# Patient Record
Sex: Male | Born: 1950 | Race: White | Hispanic: No | Marital: Married | Smoking: Former smoker
Health system: Southern US, Community
[De-identification: ages and names within clinical notes are randomized; demographics above are authoritative.]

## PROBLEM LIST (undated history)

## (undated) DIAGNOSIS — M7032 Other bursitis of elbow, left elbow: Secondary | ICD-10-CM

## (undated) DIAGNOSIS — N289 Disorder of kidney and ureter, unspecified: Secondary | ICD-10-CM

## (undated) DIAGNOSIS — I340 Nonrheumatic mitral (valve) insufficiency: Secondary | ICD-10-CM

## (undated) DIAGNOSIS — I2510 Atherosclerotic heart disease of native coronary artery without angina pectoris: Secondary | ICD-10-CM

## (undated) DIAGNOSIS — E785 Hyperlipidemia, unspecified: Secondary | ICD-10-CM

## (undated) DIAGNOSIS — I739 Peripheral vascular disease, unspecified: Secondary | ICD-10-CM

## (undated) DIAGNOSIS — E669 Obesity, unspecified: Secondary | ICD-10-CM

## (undated) DIAGNOSIS — L570 Actinic keratosis: Secondary | ICD-10-CM

## (undated) DIAGNOSIS — N529 Male erectile dysfunction, unspecified: Secondary | ICD-10-CM

## (undated) DIAGNOSIS — I25118 Atherosclerotic heart disease of native coronary artery with other forms of angina pectoris: Secondary | ICD-10-CM

## (undated) DIAGNOSIS — E1165 Type 2 diabetes mellitus with hyperglycemia: Secondary | ICD-10-CM

## (undated) DIAGNOSIS — I255 Ischemic cardiomyopathy: Secondary | ICD-10-CM

## (undated) DIAGNOSIS — I5189 Other ill-defined heart diseases: Secondary | ICD-10-CM

## (undated) DIAGNOSIS — M1990 Unspecified osteoarthritis, unspecified site: Secondary | ICD-10-CM

## (undated) DIAGNOSIS — E119 Type 2 diabetes mellitus without complications: Secondary | ICD-10-CM

## (undated) DIAGNOSIS — G4733 Obstructive sleep apnea (adult) (pediatric): Secondary | ICD-10-CM

## (undated) DIAGNOSIS — I491 Atrial premature depolarization: Secondary | ICD-10-CM

## (undated) DIAGNOSIS — I519 Heart disease, unspecified: Secondary | ICD-10-CM

## (undated) DIAGNOSIS — I471 Supraventricular tachycardia, unspecified: Secondary | ICD-10-CM

## (undated) DIAGNOSIS — I10 Essential (primary) hypertension: Secondary | ICD-10-CM

## (undated) DIAGNOSIS — I2089 Other forms of angina pectoris: Secondary | ICD-10-CM

## (undated) DIAGNOSIS — I219 Acute myocardial infarction, unspecified: Secondary | ICD-10-CM

## (undated) DIAGNOSIS — J45909 Unspecified asthma, uncomplicated: Secondary | ICD-10-CM

## (undated) DIAGNOSIS — N200 Calculus of kidney: Secondary | ICD-10-CM

## (undated) DIAGNOSIS — Z955 Presence of coronary angioplasty implant and graft: Secondary | ICD-10-CM

## (undated) DIAGNOSIS — I5020 Unspecified systolic (congestive) heart failure: Secondary | ICD-10-CM

## (undated) DIAGNOSIS — I209 Angina pectoris, unspecified: Secondary | ICD-10-CM

## (undated) HISTORY — PX: CORONARY STENT PLACEMENT: SHX1402

## (undated) HISTORY — DX: Other forms of angina pectoris: I20.89

## (undated) HISTORY — DX: Unspecified systolic (congestive) heart failure: I50.20

## (undated) HISTORY — DX: Peripheral vascular disease, unspecified: I73.9

## (undated) HISTORY — DX: Ischemic cardiomyopathy: I25.5

## (undated) HISTORY — DX: Actinic keratosis: L57.0

## (undated) HISTORY — DX: Unspecified osteoarthritis, unspecified site: M19.90

## (undated) HISTORY — DX: Calculus of kidney: N20.0

## (undated) HISTORY — DX: Heart disease, unspecified: I51.9

## (undated) HISTORY — DX: Other bursitis of elbow, left elbow: M70.32

## (undated) HISTORY — DX: Nonrheumatic mitral (valve) insufficiency: I34.0

## (undated) HISTORY — DX: Obstructive sleep apnea (adult) (pediatric): G47.33

## (undated) HISTORY — DX: Atrial premature depolarization: I49.1

## (undated) HISTORY — DX: Supraventricular tachycardia, unspecified: I47.10

## (undated) HISTORY — DX: Unspecified asthma, uncomplicated: J45.909

## (undated) HISTORY — DX: Obesity, unspecified: E66.9

## (undated) HISTORY — PX: CORONARY ANGIOPLASTY: SHX604

## (undated) HISTORY — DX: Male erectile dysfunction, unspecified: N52.9

## (undated) SURGERY — LEFT HEART CATH AND CORONARY ANGIOGRAPHY
Anesthesia: LOCAL

---
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
## 2007-09-13 ENCOUNTER — Inpatient Hospital Stay (HOSPITAL_COMMUNITY): Admission: EM | Admit: 2007-09-13 | Discharge: 2007-09-20 | Payer: Self-pay | Admitting: Emergency Medicine

## 2007-09-16 ENCOUNTER — Encounter (INDEPENDENT_AMBULATORY_CARE_PROVIDER_SITE_OTHER): Payer: Self-pay | Admitting: Cardiology

## 2009-11-02 ENCOUNTER — Emergency Department (HOSPITAL_COMMUNITY): Admission: EM | Admit: 2009-11-02 | Discharge: 2009-11-02 | Payer: Self-pay | Admitting: Emergency Medicine

## 2010-07-28 LAB — BASIC METABOLIC PANEL
BUN: 10 mg/dL (ref 6–23)
CO2: 22 mEq/L (ref 19–32)
Calcium: 9.3 mg/dL (ref 8.4–10.5)
Chloride: 106 mEq/L (ref 96–112)
Creatinine, Ser: 0.89 mg/dL (ref 0.4–1.5)
GFR calc Af Amer: >60 mL/min (ref >60)
Glucose, Bld: 159 mg/dL — ABNORMAL HIGH (ref 70–99)
Potassium: 4.1 mEq/L (ref 3.5–5.1)
Sodium: 139 mEq/L (ref 135–145)

## 2010-07-28 LAB — DIFFERENTIAL
Lymphocytes Relative: 30 % (ref 12–46)
Lymphs Abs: 1.6 10*3/uL (ref 0.7–4.0)

## 2010-07-28 LAB — PROTIME-INR: Prothrombin Time: 15.4 seconds — ABNORMAL HIGH (ref 11.6–15.2)

## 2010-07-28 LAB — CBC
HCT: 43.2 % (ref 39.0–52.0)
Hemoglobin: 14.8 g/dL (ref 13.0–17.0)
MCH: 33.3 pg (ref 26.0–34.0)
RDW: 13.4 % (ref 11.5–15.5)
WBC: 5.5 10*3/uL (ref 4.0–10.5)

## 2010-07-28 LAB — CK TOTAL AND CKMB (NOT AT ARMC)
CK, MB: 1.9 ng/mL (ref 0.3–4.0)
Total CK: 78 U/L (ref 7–232)

## 2010-07-28 LAB — APTT: aPTT: 28 seconds (ref 24–37)

## 2010-09-24 NOTE — Cardiovascular Report (Signed)
[name], DERUITER NO.:  [id number]   MEDICAL RECORD NO.:  [id number]          PATIENT TYPE:  INP   LOCATION:  2911                         FACILITY:  [facility]   PHYSICIAN:  Cristy Hilts. Jacinto Halim, MD       DATE OF BIRTH:  1951-03-09   DATE OF PROCEDURE:  09/13/2007  DATE OF DISCHARGE:                            CARDIAC CATHETERIZATION   PROCEDURE PERFORMED:  1. Left ventriculography.  2. Selective right and left coronary artery angiography.  3. Percutaneous transluminal coronary angioplasty and stenting of the      proximal left anterior descending.  4. Percutaneous transluminal coronary angioplasty and balloon      angioplasty of the in-stent restenotic mid left anterior      descending.  5. Rescue of the stent-jailed diagonal one branch of the left anterior      descending.   INDICATIONS:  [name] is a 60 year old gentleman with known  coronary artery disease.  He has undergone angioplasty and stent  implantation x2 in the past in 1995 and again in 2000, somewhere between  1997 and 2001, details are not known.  He is a smoker, who was admitted  through emergency room as a STEMI for acute anterior wall myocardial  infarction.  Given his ongoing chest discomfort with EKG abnormality, he  was emergently brought to the cardiac catheterization lab to evaluate  his coronary anatomy.   HEMODYNAMIC DATA:  The left ventricular pressure was 143/0 with end-  diastolic pressure of 34 mmHg.  Aortic pressure was 131/70 with a mean  of 93 mmHg.  There was a 10-mm pressure gradient across the aortic  valve.   ANGIOGRAPHIC DATA:  1. Left ventricle:  Left ventricular systolic function was mildly      depressed around 45%-50% with the mid-to-distal anteroapical and      apical akinesis.  There was no significant mitral regurgitation.  2. Right coronary artery:  Right coronary artery is a large-caliber      vessel and a dominant vessel.  It has got mild luminal diffuse    irregularity.  3. Left main coronary artery:  Left main coronary artery is a large-      caliber vessel.  Smooth, and it has got 30% ostial and distal      stenosis.  4. LAD:  LAD is a large-caliber vessel.  There is a thrombotic 90% in      the proximal RCA proximal to the previously placed stent.  The      stent also shows an in-stent restenotic 90% stenosis and could not      exclude the presence of thrombus in this LAD.  The diagonal stent      was patent; however, the ostium of the diagonal had mild disease.  5. Circumflex coronary artery:  Circumflex coronary artery is a large-      caliber vessel.  Gives origin to large obtuse marginal-1, which has      an ostial 50% stenosis.  At the bifurcation of this obtuse      marginal, the circumflex also showed a 40%  stenosis and a mid 50%      stenosis.  Distal circumflex showed a bifurcating 80%-90% stenoses.      These branches were small and probably not amenable for balloon      angioplasty.  A small AV groove branch from the circumflex also      shows a focal 90% ostial stenosis.  There were faint collaterals      noted from the right coronary artery to the distal circumflex      coronary artery.   INTERVENTIONAL DATA:  Successful PTCA and stenting of the proximal LAD  with implantation of a 3.5 x 23-mm PROMUS stent deployed at 6  atmospheric pressure for 40 seconds.  This stent was postdilated with a  3.75 x 15-mm Quantum at peak of 14 atmospheric pressure.  Overall, the  stenosis was reduced from 90% stenosis to 0% with TIMI-3 to TIMI-3 flow  maintained at the end of the procedure.   Successful PTCA and balloon angioplasty of the in-stent restenotic mid  LAD with a 3.0 x 30-mm Apex balloon with reduction of stenosis from 90%  to 0%.  TIMI-3 flow was maintained pre and post-procedure.   The diagonal branch of the LAD got stent-jailed and was completely  occluded after proximal stent implantation.  However, this was easily   recanalized with a wire, and kissing-balloon angioplasty was performed  utilizing a 2.5 x 15-mm Apex balloon at the ostium of the diagonal with  complete salvage and maintenance of TIMI-3 flow.   The procedure was finished by performing kissing-balloon angioplasty  into the diagonal branch and the LAD.   RECOMMENDATIONS:  The patient will need aggressive risk modification and  especially smoking cessation.  He will need medical therapy for the  circumflex disease.   A total of 235 mL of contrast was utilized for diagnostic and  interventional procedure.   TECHNIQUE OF THE PROCEDURE:  Using heparin and ReoPro for  anticoagulation, a 6-French right femoral venous access and a 7-French  right femoral arterial access were obtained, and a 7-French arterial  sheath was placed.  A 6-French multipurpose B2 catheter was advanced  into the ascending aorta and then into the left ventricle.  The left  ventriculography was performed both in LAO and RAO projection.  The  catheter was then pulled into the ascending aorta.  Right coronary  artery was selectively engaged and angiography was performed.  Then, the  left main coronary artery was selectively engaged and angiography was  performed.  Nitroglycerin was also administered angiography.  The  catheter was then pulled out of the body.   Using a 7-French JL4 guide catheter, left main coronary artery was  selectively engaged and angiography was performed.  A 0.014th of an inch  ATW marker guidewire was utilized and the LAD was crossed.  A 3.0 x 30-  mm Apex balloon was utilized, and multiple balloon inflations were  performed within the stent and outside of the stent into the proximal  LAD, and excellent results were noted with residual haziness in the  proximal LAD.  Then, I stented this with a 3.5 x 23-mm PROMUS at 16  atmospheric pressure and angiography revealed occluded diagonal of,  i.e., stent-jailed.  The mid LAD stent was also dilated  with a 3.0 x 30-  mm Apex balloon and at the same time, I advanced Asahi Prowater 0.014th  of an inch guidewire into the diagonal branch and a 2.5 x 15-mm Apex  balloon was utilized,  and ostium was dilated at 10 atmospheric pressure  for 35 seconds.  Two inflations were performed into this diagonal branch  and angiography was repeated.  Excellent results were noted, and I  pulled the 3.0 x 30-mm Apex balloon into the proximal LAD overlapping  with the previously placed stent and placed the 2.5 x 50-mm Apex balloon  into the diagonal branch, and kissing-balloon angioplasty was performed  and angiography was repeated.  Excellent results were noted.  The  guidewire guide catheter was then pulled out of the body and angiography  was repeated.  Intracoronary nitroglycerin was also administered.  After  confirming good results, the guide catheter was disengaged and pulled  out of the body.  The patient tolerated the procedure well.  No  immediate complication was noted.      Cristy Hilts. Jacinto Halim, MD  Electronically Signed     JRG/MEDQ  D:  09/13/2007  T:  09/14/2007  Job:  [id number]   cc:   Barron Alvine

## 2010-09-24 NOTE — H&P (Signed)
[name], [name] NO.:  [id number]   MEDICAL RECORD NO.:  [id number]          PATIENT TYPE:  INP   LOCATION:  2911                         FACILITY:  [facility]   PHYSICIAN:  Cristy Hilts. Jacinto Halim, MD       DATE OF BIRTH:  Aug 18, 1950   DATE OF ADMISSION:  09/13/2007  DATE OF DISCHARGE:                              HISTORY & PHYSICAL   CHIEF COMPLAINT:  Chest pain.   HISTORY OF PRESENT ILLNESS:  A 60 year old, white, married, male patient  not seen by a cardiologist in [place] previously who has a history of  MI in 1995 with stent placed, some minor cardiac issues until 2001 and  had another stent placed.  This was in [place], [place] with the [employer]  He moved to the [place] area 2 years ago and has seen a Dr. Hollice Espy at  [place] and no other no cardiologist.  Today he developed acute onset  mid sternal chest pain with radiation across the shoulders, was not  diaphoretic, positive short of breath. We saw him in the emergency room  as this was called as an ST elevation MI and he had pain for  approximately 1 hour and 15 minutes, but that was guessing because he  was not sure what time it started.   In the ER, he had ST elevations in V1-V4 and reciprocal changes in 2, 3  and aVF.  He was seen by Dr. Jacinto Halim, EKG was seen and the patient was  transported immediately to the cardiac cath lab for emergent cardiac  cath.   PAST MEDICAL HISTORY:  Cardiac as stated.  His wife will bring in  records but they live in [place] currently and she did not have the  records en route to the hospital. Other history does include  hyperlipidemia, borderline hypertension, no diabetes and positive  tobacco use and history of tonsillectomy.   ALLERGIES:  ATARAX.   CURRENT MEDICATIONS:  1. Aspirin 81 daily.  2. Lipitor unsure of the dose.  3. Lopressor one daily.   FAMILY HISTORY:  Not significant to this admission.   SOCIAL HISTORY:  He is married.  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 Continues to smoke.   REVIEW OF SYSTEMS:  GENERAL:  No recent colds or fevers.  SKIN:  No  rashes.  HEENT:  No blurred vision.  CARDIOVASCULAR:  As stated in HPI.  PULMONARY:  No shortness of breath until today.  GI:  No blood in his  stools or his urine. No history of any GI bleeds. MUSCULOSKELETAL:  Negative but again this is a brief history as the patient in route to  the cath lab.  NEURO:  No history of CVA and no syncope.  ENDOCRINE:  No  diabetes.  No thyroid disease.   PHYSICAL EXAM:  Blood pressure 135/94 on arrival to the cath lab. Heart  rate 102, sinus rhythm, respirations initially 27. Oxygen saturation 97%  on 2 liters.  GENERAL:  Alert and oriented white male in no acute distress except that  he continued to have  pain.  He is a stoic individual, did not appear to  be in distress.  Affect pleasant but obviously anxious and concerned  about his pain and he was aware he was having an MI.  SKIN:  Warm and dry.  Brisk capillary refill.  HEENT:  Normocephalic.  Sclera clear.  NECK:  Supple.  No JVD, no bruits.  No thyromegaly.  LUNGS:  Clear without rales, rhonchi or wheezes.  HEART:  S1, S2 regular rate and rhythm.  No obvious murmur, gallop, rub  or click.  ABDOMEN:  Soft, nontender, positive bowel sounds.  Do not palpate liver,  spleen or masses.  LOWER EXTREMITIES:  Without edema.  Pedal pulses present.  NEURO:  Alert and oriented x3.  Follows commands.   ASSESSMENT:  1. Acute ST elevation myocardial infarction.  2. History of coronary disease.  3. Hyperlipidemia.  4. History of borderline hypertension.  5. Positive tobacco use.   PLAN:  Emergently to the cath lab for intervention. Will need to get old  records that he has at home. His wife was contacted and she will bring  them in on Sep 14, 2007. We will also have tobacco cessation medical  treatment as well.  Dr. Jacinto Halim saw him and assessed him and then the  patient was en route to the  cath lab.      Darcella Gasman. [place], N.P.      Cristy Hilts. Jacinto Halim, MD  Electronically Signed    LRI/MEDQ  D:  09/13/2007  T:  09/13/2007  Job:  [id number]

## 2010-09-27 NOTE — Discharge Summary (Signed)
NAMETISON, LEIBOLD NO.:  [id number]   MEDICAL RECORD NO.:  [id number]          PATIENT TYPE:  INP   LOCATION:  2019                         FACILITY:  [facility]   PHYSICIAN:  Dani Gobble, MD       DATE OF BIRTH:  1951/03/10   DATE OF ADMISSION:  09/13/2007  DATE OF DISCHARGE:  09/20/2007                               DISCHARGE SUMMARY   Mr. Aumiller is a 60 year old white married male who came as a ST  elevation MI.  He was taken emergently to the cath lab.  He had a prior  history of coronary artery disease with a history of an MI in 1995 with  a stent placed without any later cardiac issues until 2001 at which time  his CABG took place.  This was apparently when his family lived in [place]  in [place].  He moved to [facility] about does 2 years ago and has been  seeing Dr. Hollice Espy in [place].  He developed acute onset of midsternal  chest pain with radiation across the shoulders.  He was not diaphoretic.  He was short of breath.  He was brought to the emergency room and ST  elevation MI was called.  He was seen by Dr. Yates Decamp and taken to the  cath lab.  He was found to have a disease in his RCA 40%-30%, circumflex  had 40%-50%.  He had 90% in his distal circumflex.  His proximal LAD was  90%.  He had thrombotic stenosis and in-stent 90%.  He had an old stent  in his diagonal 1 that was patent.  Thus, he underwent a PTCA and  stenting with a Promus 3.5 mm x 23 mm stent.  Post procedure, he went  into acute pulmonary edema treated with IV diuretics.  He was put back  on heparin Sep 15, 2007 when he had a episode of acute pulmonary edema.  He was treated with IV diuretics.  His IV heparin was restarted.  A 2D  echo was ordered the following day to make sure his EF had not  decreased.  At cath, it was 45%-50%.  The echo did not show that his EF  had decreased, it was stable at 45%.  He responded nicely to IV  diuretics and he was ready for discharge on Sep 20, 2007.  He  was seen  by cardiac rehab and he was committed to quitting smoking.  On the day  of discharge, his blood pressure was 98/73, heart rate was 77,  respirations were 20, temperature was 98.1.   LABORATORY DATA:  Hemoglobin was on admission was 17.9, hematocrit 52.3.  On the day of discharge, it was 15.6, hematocrit 45.2, platelets were  251, and WBCs were 8.7.  Sodium was 138, potassium 3.7, glucose was 108.  BUN was 16, creatinine 1.05.  AST originally was 613, ALT was 102.  On  Sep 17, 2007, his AST was 36 and ALT was 41.  His albumin was 2.8,  hemoglobin A1c was 7.6.  His CK-MB #1 136/3.8, troponin of 0.04; #2  6810/232, troponin greater  than 100; #3 2585/283, troponin greater than  100; #4 1791/171, troponin of 91.76.  From there, his CK-MBs continued  to decrease.  On Sep 16, 2007, his CK was 192, CK-MB 5.8, troponin of  21.12.  Total cholesterol was 118, triglycerides 55, HDL was 24, LDL was  83, and BNP was 737 on Sep 19, 2007.  TSH was 1.641.  Chest x-ray on  admission showed mild pulmonary edema.  On the 6th, it showed some  bibasilar densities suggestive for dependent edema or atelectasis.  On  May 7, he had improved edema and left pleural effusion.  On Sep 19, 2007, he had some suspected mild residual pulmonary edema with diffuse  interstitial prominence.   DISCHARGE MEDICATIONS:  1. Plavix 75 mg a day.  He was told to stop.  2. Aspirin 325 mg a day.  3. Lipitor 80 mg a day.  4. Niaspan 500 mg at bedtime.  He should take aspirin 30 minutes      before.  5. Lasix 40 mg a day.  6. Ramipril 1.25 mg a day.  7. Coreg 3.125 mg twice per day.  8. Pepcid 20 mg once or twice per day.  9. Nitroglycerin one 150 under tongue every 5 minutes x3 as needed for      chest pain.   His EKG showed Q-waves in anterior septal leads.   DISCHARGE DIAGNOSES:  1. Acute ST elevation myocardial infarction with ST elevation in his      anterior septal leads, taken emergently to cath lab.  2.  Status post cath with findings of a thrombotic 90% at proximal left      anterior descending proximal to the previously placed stent.  The      stent also showed an in-stent restenosis 90% stenosis.  He      underwent successful percutaneous transluminal coronary angioplasty      and stenting of proximal left anterior descending with implantation      of a 3.5 x 23 mm Promus stent by Dr. Yates Decamp.  He also had risk      of a stent jail diagonal 1 branch of the left anterior descending.  3. Ejection fraction of 45%-50%.  4. Acute pulmonary edema 2 days post procedure.  5. Hypertension.  6. Tobacco use.  The patient was committed to quitting.  7. Hyperlipidemia.  8. Elevated hemoglobin A1c of 7.6, needs to be rechecked as an      outpatient.  His blood sugars did not correlate with a hemoglobin      A1c this high.  They were in the 103-123 range.  However, initially      his blood sugar was 210.  9. Hypotension, difficulty to titrating medications.  10.Gastroesophageal reflux disease.   OTHER DISCHARGE INSTRUCTIONS:  He was told not to do any strenuous  activity or exercise and no driving for a week.  He was told not to  return to work and he will call if he has any problems.  He has a  followup with Dr. Jacinto Halim on Oct 07, 2007.      Lezlie Octave, N.P.    ______________________________  Dani Gobble, MD    BB/MEDQ  D:  10/18/2007  T:  10/19/2007  Job:  [id number]   cc:   Barron Alvine

## 2011-04-12 ENCOUNTER — Inpatient Hospital Stay (HOSPITAL_COMMUNITY)
Admission: EM | Admit: 2011-04-12 | Discharge: 2011-04-15 | DRG: 247 | Disposition: A | Discharge status: Home / Self Care | Source: Ambulatory Visit | Attending: Cardiovascular Disease | Admitting: Cardiovascular Disease

## 2011-04-12 ENCOUNTER — Emergency Department (HOSPITAL_COMMUNITY)

## 2011-04-12 ENCOUNTER — Encounter (HOSPITAL_COMMUNITY): Payer: Self-pay | Admitting: Emergency Medicine

## 2011-04-12 DIAGNOSIS — I2510 Atherosclerotic heart disease of native coronary artery without angina pectoris: Principal | ICD-10-CM

## 2011-04-12 DIAGNOSIS — R0902 Hypoxemia: Secondary | ICD-10-CM

## 2011-04-12 DIAGNOSIS — C61 Malignant neoplasm of prostate: Secondary | ICD-10-CM

## 2011-04-12 DIAGNOSIS — N10 Acute tubulo-interstitial nephritis: Secondary | ICD-10-CM

## 2011-04-12 DIAGNOSIS — Z87891 Personal history of nicotine dependence: Secondary | ICD-10-CM

## 2011-04-12 DIAGNOSIS — E119 Type 2 diabetes mellitus without complications: Secondary | ICD-10-CM | POA: Diagnosis present

## 2011-04-12 DIAGNOSIS — Z888 Allergy status to other drugs, medicaments and biological substances status: Secondary | ICD-10-CM

## 2011-04-12 DIAGNOSIS — Z9861 Coronary angioplasty status: Secondary | ICD-10-CM

## 2011-04-12 DIAGNOSIS — I208 Other forms of angina pectoris: Secondary | ICD-10-CM | POA: Diagnosis present

## 2011-04-12 DIAGNOSIS — I10 Essential (primary) hypertension: Secondary | ICD-10-CM | POA: Diagnosis present

## 2011-04-12 DIAGNOSIS — I200 Unstable angina: Secondary | ICD-10-CM

## 2011-04-12 DIAGNOSIS — I252 Old myocardial infarction: Secondary | ICD-10-CM

## 2011-04-12 DIAGNOSIS — Z79899 Other long term (current) drug therapy: Secondary | ICD-10-CM

## 2011-04-12 DIAGNOSIS — E785 Hyperlipidemia, unspecified: Secondary | ICD-10-CM | POA: Diagnosis present

## 2011-04-12 HISTORY — DX: Hyperlipidemia, unspecified: E78.5

## 2011-04-12 HISTORY — DX: Essential (primary) hypertension: I10

## 2011-04-12 HISTORY — DX: Angina pectoris, unspecified: I20.9

## 2011-04-12 HISTORY — DX: Atherosclerotic heart disease of native coronary artery without angina pectoris: I25.10

## 2011-04-12 HISTORY — DX: Acute myocardial infarction, unspecified: I21.9

## 2011-04-12 LAB — BASIC METABOLIC PANEL
BUN: 14 mg/dL (ref 6–23)
Calcium: 9.6 mg/dL (ref 8.4–10.5)
GFR calc Af Amer: >90 mL/min (ref >90)
GFR calc non Af Amer: >90 mL/min (ref >90)
Glucose, Bld: 104 mg/dL — ABNORMAL HIGH (ref 70–99)

## 2011-04-12 LAB — CBC
Hemoglobin: 16.2 g/dL (ref 13.0–17.0)
MCH: 32 pg (ref 26.0–34.0)
MCHC: 35.4 g/dL (ref 30.0–36.0)
MCV: 90.1 fL (ref 78.0–100.0)
Platelets: 194 10*3/uL (ref 150–400)
RBC: 5.07 MIL/uL (ref 4.22–5.81)

## 2011-04-12 LAB — CARDIAC PANEL(CRET KIN+CKTOT+MB+TROPI)
CK, MB: 2.8 ng/mL (ref 0.3–4.0)
CK, MB: 2.5 ng/mL (ref 0.3–4.0)
Relative Index: INVALID (ref 0.0–2.5)
Total CK: 85 U/L (ref 7–232)
Total CK: 73 U/L (ref 7–232)
Troponin I: <0.3 ng/mL (ref <0.30)

## 2011-04-12 LAB — URINALYSIS, ROUTINE W REFLEX MICROSCOPIC
Leukocytes, UA: NEGATIVE
Nitrite: NEGATIVE
Specific Gravity, Urine: 1.02 (ref 1.005–1.030)
Urobilinogen, UA: 1 mg/dL (ref 0.0–1.0)

## 2011-04-12 LAB — APTT: aPTT: 31 seconds (ref 24–37)

## 2011-04-12 LAB — PROTIME-INR: INR: 0.98 (ref 0.00–1.49)

## 2011-04-12 LAB — DIFFERENTIAL
Basophils Relative: 1 % (ref 0–1)
Eosinophils Absolute: 0.3 10*3/uL (ref 0.0–0.7)
Eosinophils Relative: 3 % (ref 0–5)
Lymphs Abs: 3 10*3/uL (ref 0.7–4.0)
Monocytes Relative: 6 % (ref 3–12)

## 2011-04-12 MED ORDER — HEPARIN SOD (PORCINE) IN D5W 100 UNIT/ML IV SOLN
1250.0000 [IU]/h | INTRAVENOUS | Status: DC
  Administered 2011-04-12 – 2011-04-13 (×2): 1250 [IU]/h via INTRAVENOUS
  Filled 2011-04-12 (×5): qty 250

## 2011-04-12 MED ORDER — AMLODIPINE BESYLATE 5 MG PO TABS
5.0000 mg | ORAL_TABLET | Freq: Every day | ORAL | Status: DC
  Administered 2011-04-12 – 2011-04-15 (×4): 5 mg via ORAL
  Filled 2011-04-12 (×4): qty 1

## 2011-04-12 MED ORDER — NITROGLYCERIN 0.4 MG SL SUBL
0.4000 mg | SUBLINGUAL_TABLET | SUBLINGUAL | Status: DC | PRN
  Administered 2011-04-12: 0.4 mg via SUBLINGUAL
  Filled 2011-04-12: qty 25

## 2011-04-12 MED ORDER — ACETAMINOPHEN 325 MG PO TABS
650.0000 mg | ORAL_TABLET | ORAL | Status: DC | PRN
  Filled 2011-04-12: qty 1

## 2011-04-12 MED ORDER — RAMIPRIL 2.5 MG PO CAPS
2.5000 mg | ORAL_CAPSULE | Freq: Every day | ORAL | Status: DC
  Administered 2011-04-12 – 2011-04-15 (×4): 2.5 mg via ORAL
  Filled 2011-04-12 (×4): qty 1

## 2011-04-12 MED ORDER — HEPARIN BOLUS VIA INFUSION
4000.0000 [IU] | Freq: Once | INTRAVENOUS | Status: AC
  Administered 2011-04-12: 4000 [IU] via INTRAVENOUS
  Filled 2011-04-12: qty 4000

## 2011-04-12 MED ORDER — CLOPIDOGREL BISULFATE 75 MG PO TABS
75.0000 mg | ORAL_TABLET | Freq: Every day | ORAL | Status: DC
  Administered 2011-04-12 – 2011-04-14 (×3): 75 mg via ORAL
  Filled 2011-04-12 (×3): qty 1

## 2011-04-12 MED ORDER — NITROGLYCERIN IN D5W 200-5 MCG/ML-% IV SOLN
2.0000 ug/min | INTRAVENOUS | Status: DC
  Administered 2011-04-12: 2 ug/min via INTRAVENOUS
  Filled 2011-04-12: qty 250

## 2011-04-12 MED ORDER — ONDANSETRON HCL 4 MG/2ML IJ SOLN: 4.0000 mg | Freq: Four times a day (QID) | INTRAMUSCULAR | Status: DC | PRN

## 2011-04-12 MED ORDER — ASPIRIN 325 MG PO TABS
325.0000 mg | ORAL_TABLET | Freq: Every day | ORAL | Status: DC
  Administered 2011-04-13 – 2011-04-15 (×3): 325 mg via ORAL
  Filled 2011-04-12 (×3): qty 1

## 2011-04-12 MED ORDER — CARVEDILOL 6.25 MG PO TABS
6.2500 mg | ORAL_TABLET | Freq: Two times a day (BID) | ORAL | Status: DC
  Administered 2011-04-13 – 2011-04-15 (×5): 6.25 mg via ORAL
  Filled 2011-04-12 (×7): qty 1

## 2011-04-12 MED ORDER — THERA M PLUS PO TABS
1.0000 | ORAL_TABLET | Freq: Every day | ORAL | Status: DC
  Administered 2011-04-12 – 2011-04-15 (×4): 1 via ORAL
  Filled 2011-04-12 (×4): qty 1

## 2011-04-12 MED ORDER — NIACIN ER (ANTIHYPERLIPIDEMIC) 500 MG PO TBCR
2000.0000 mg | EXTENDED_RELEASE_TABLET | Freq: Every day | ORAL | Status: DC
  Administered 2011-04-13 – 2011-04-14 (×3): 2000 mg via ORAL
  Filled 2011-04-12 (×4): qty 4

## 2011-04-12 MED ORDER — ZOLPIDEM TARTRATE 5 MG PO TABS: 5.0000 mg | ORAL_TABLET | Freq: Every evening | ORAL | Status: DC | PRN

## 2011-04-12 MED ORDER — ALPRAZOLAM 0.25 MG PO TABS: 0.2500 mg | ORAL_TABLET | Freq: Two times a day (BID) | ORAL | Status: DC | PRN

## 2011-04-12 MED ORDER — SODIUM CHLORIDE 0.9 % IV SOLN
INTRAVENOUS | Status: DC
  Administered 2011-04-12: 20 mL/h via INTRAVENOUS

## 2011-04-12 MED ORDER — ROSUVASTATIN CALCIUM 20 MG PO TABS
20.0000 mg | ORAL_TABLET | Freq: Every day | ORAL | Status: DC
  Administered 2011-04-12 – 2011-04-15 (×4): 20 mg via ORAL
  Filled 2011-04-12 (×4): qty 1

## 2011-04-12 MED ORDER — ALFUZOSIN HCL ER 10 MG PO TB24
10.0000 mg | ORAL_TABLET | Freq: Every day | ORAL | Status: DC
  Administered 2011-04-12 – 2011-04-15 (×4): 10 mg via ORAL
  Filled 2011-04-12 (×4): qty 1

## 2011-04-12 MED ORDER — ASPIRIN 81 MG PO CHEW
324.0000 mg | CHEWABLE_TABLET | Freq: Once | ORAL | Status: AC
  Administered 2011-04-12: 324 mg via ORAL
  Filled 2011-04-12: qty 4

## 2011-04-12 NOTE — ED Notes (Signed)
I saw and evaluated the patient, reviewed the resident's note and I agree with the findings and plan.  2 weeks of intermittent chest pain.  Extensive CAD history with stents.  Glynn Octave, MD 04/12/11 [phone]

## 2011-04-12 NOTE — ED Notes (Signed)
Pt reports going through separation with wife 2 weeks ago. Began having some intermittant chest discomfort at that time.

## 2011-04-12 NOTE — ED Notes (Signed)
[facility] at bedside.

## 2011-04-12 NOTE — ED Provider Notes (Signed)
History     CSN: [id number] Arrival date & time: 04/12/2011 11:09 AM   First MD Initiated Contact with Patient 04/12/11 1233      Chief Complaint  Patient presents with  . Chest Pain    (Consider location/radiation/quality/duration/timing/severity/associated sxs/prior treatment) HPI Comments: Patient has a total of 4-5 stents. His last stent was placed in March of 2009   Patient is a 60 y.o. male presenting with chest pain. The history is provided by the patient.  Chest Pain Episode onset: 2 weeks ago. Chest pain occurs intermittently. The chest pain is worsening. The pain is associated with stress. At its most intense, the pain is at 7/10. The pain is currently at 5/10. The severity of the pain is moderate. The quality of the pain is described as aching, burning and heavy. The pain does not radiate. Chest pain is worsened by stress. Primary symptoms include shortness of breath and nausea. Pertinent negatives for primary symptoms include no fever, no syncope, no cough, no palpitations, no abdominal pain, no vomiting and no dizziness.  Associated symptoms include diaphoresis.  Pertinent negatives for associated symptoms include no lower extremity edema, no near-syncope and no weakness. He tried aspirin (He did not try any nitroglycerin) for the symptoms. Risk factors include male gender and stress.  His past medical history is significant for CAD, hyperlipidemia and hypertension.  Procedure history is positive for cardiac catheterization.     Past Medical History  Diagnosis Date  . Myocardial infarction   . Hypertension     Past Surgical History  Procedure Date  . Coronary stent placement   . Tonsillectomy and adenoidectomy     History reviewed. No pertinent family history.  History  Substance Use Topics  . Smoking status: Former Games developer  . Smokeless tobacco: Never Used  . Alcohol Use: Yes     rarely      Review of Systems  Constitutional: Positive for diaphoresis.  Negative for fever.  Respiratory: Positive for shortness of breath. Negative for cough.   Cardiovascular: Positive for chest pain. Negative for palpitations, syncope and near-syncope.  Gastrointestinal: Positive for nausea. Negative for vomiting and abdominal pain.  Neurological: Negative for dizziness and weakness.  All other systems reviewed and are negative.    Allergies  Review of patient's allergies indicates no known allergies.  Home Medications   Current Outpatient Rx  Name Route Sig Dispense Refill  . ALFUZOSIN HCL ER 10 MG PO TB24 Oral Take 10 mg by mouth daily.      Marland Kitchen AMLODIPINE BESYLATE 5 MG PO TABS Oral Take 5 mg by mouth daily.      . ASPIRIN 325 MG PO TABS Oral Take 325 mg by mouth daily.      . ATORVASTATIN CALCIUM 80 MG PO TABS Oral Take 80 mg by mouth daily.      Marland Kitchen CARVEDILOL 6.25 MG PO TABS Oral Take 6.25 mg by mouth 2 (two) times daily with a meal.      . CLOPIDOGREL BISULFATE 75 MG PO TABS Oral Take 75 mg by mouth daily.      Carma Leaven M PLUS PO TABS Oral Take 1 tablet by mouth daily.      Marland Kitchen NIACIN (ANTIHYPERLIPIDEMIC) 1000 MG PO TBCR Oral Take 2,000 mg by mouth at bedtime.      Marland Kitchen NITROGLYCERIN 0.4 MG/SPRAY TL SOLN Sublingual Place 1 spray under the tongue every 5 (five) minutes as needed. For chest pain.     Marland Kitchen RAMIPRIL 2.5 MG PO  CAPS Oral Take 2.5 mg by mouth daily.        BP 117/90  Pulse 84  Temp(Src) 98.1 F (36.7 C) (Oral)  Resp 18  SpO2 96%  Physical Exam  Nursing note and vitals reviewed. Constitutional: He is oriented to person, place, and time. He appears well-developed and well-nourished. No distress.  HENT:  Head: Normocephalic and atraumatic.  Mouth/Throat: Oropharynx is clear and moist.  Eyes: Conjunctivae and EOM are normal. Pupils are equal, round, and reactive to light.  Neck: Normal range of motion. Neck supple.  Cardiovascular: Normal rate, regular rhythm and intact distal pulses.   No murmur heard. Pulmonary/Chest: Effort normal and  breath sounds normal. No respiratory distress. He has no wheezes. He has no rales.  Abdominal: Soft. He exhibits no distension. There is no tenderness. There is no rebound and no guarding.  Musculoskeletal: Normal range of motion. He exhibits no edema and no tenderness.  Neurological: He is alert and oriented to person, place, and time.  Skin: Skin is warm and dry. No rash noted. No erythema.  Psychiatric: He has a normal mood and affect. His behavior is normal.    ED Course  Procedures (including critical care time)   Labs Reviewed  CBC  DIFFERENTIAL  BASIC METABOLIC PANEL  CARDIAC PANEL(CRET KIN+CKTOT+MB+TROPI)  PROTIME-INR  APTT  URINALYSIS, ROUTINE W REFLEX MICROSCOPIC   No results found.   No diagnosis found.    MDM   Patient with symptoms concerning for ACS. The last 2 weeks intermittent chest burning, and pain. With associated shortness of breath, fatigue, nausea, diaphoresis. Patient with a history of 5 stents. The last stent was placed in March of 2009. The patient states he feels symptoms are exacerbated by the fact that he and his wife are having problems and he has been under a lot of stress over the last 2 weeks. Patient is currently having chest pain right now. His EKG been having chest pain does not show STEMI. Patient given aspirin and nitroglycerin here to see if that resolves his pain. Patient has seen [facility] in the past. Will move him back to an exam room all he is waiting for his labs on admission.  UPDATE 1500: Labs returned, all normal.  CP correlates to emotional stress, nl EKG and troponin pending.  Given extensive cardiac hx and last cardiac eval in 2010, should be admitted for full r/o and w/u.  Spoke with on call cardiologist for [facility], who agrees that the pt warrants w/u by cardiology.  They agreed to come see patient.  Gwyneth Sprout, MD 04/12/11 2149

## 2011-04-12 NOTE — ED Notes (Signed)
Pt resting comfortably, reading. Denying any pain at this time.

## 2011-04-12 NOTE — ED Notes (Signed)
Pt denying any CP, Cardiology consulted, still wanting Nitro drip. Pt notified. Agreed to go ahead with nitro drip. Reporting chest discomfort <1/10. Vitals stable. NAD.

## 2011-04-12 NOTE — Progress Notes (Signed)
ANTICOAGULATION CONSULT NOTE - Initial Consult  Pharmacy Consult for Heparin Indication: CP & ACS sx  Allergies  Allergen Reactions  . Atarax (Hydroxyzine Hcl)     Weakness, out of this world feeling.    Patient Measurements: Height: 5\' 8"  (172.7 cm) Weight: 210 lb (95.255 kg) IBW/kg (Calculated) : 68.4  Heparin Dosing Weight: 88.4 kg  Vital Signs: Temp: 98.1 F (36.7 C) (12/01 1115) Temp src: Oral (12/01 1115) BP: 112/64 mmHg (12/01 1545) Pulse Rate: 75  (12/01 1545)  Labs:  Basename 04/12/11 1555 04/12/11 1239  HGB -- 16.2  HCT -- 45.7  PLT -- 194  APTT -- 31  LABPROT -- 13.2  INR -- 0.98  HEPARINUNFRC -- --  CREATININE -- 0.82  CKTOTAL 85 98  CKMB 2.8 3.2  TROPONINI <0.30 <0.30   Estimated Creatinine Clearance: 107.3 ml/min (by C-G formula based on Cr of 0.82).  Medical History: Past Medical History  Diagnosis Date  . Myocardial infarction   . Hypertension   . CAD (coronary artery disease) 04/12/2011  . HTN (hypertension) 04/12/2011  . Hyperlipemia 04/12/2011  . Angina     Medications:   (Not in a hospital admission) Scheduled:    . aspirin  324 mg Oral Once    Assessment: 60 y.o. M to start heparin for ACS sx. Wt: 95 kg, SCr: 0.82, CrCl~100 ml/min. Hgb/Hct/Plt ok. No recent surgeries or hx/o CVA.   Goal of Therapy:  Heparin level 0.3-0.7 units/ml   Plan:  1. Heparin bolus of 4000 units x 1 2. Initiate heparin drip at rate of 1250 units/hr (12.5 ml/hr) 3. Will continue to monitor for any signs/symptoms of bleeding and will follow up with heparin level in 6 hours  Rolley Sims 04/12/2011,5:39 PM

## 2011-04-12 NOTE — ED Notes (Signed)
Pt c/o chest "tightness" x 2 weeks. Pt reports seeking care today because CP still present despite some stress relief in his personal life. Pt reporting significant stress in life x 2 weeks. At this time, pt denying any CP or SOB. Skin warm and dry. Vitals stable, pt on monitor.

## 2011-04-12 NOTE — ED Notes (Signed)
Pt denying any CP at this time. Pt informed nitro drip order by cardiology. Pt denying any pain at this time, requesting to not have the nitro drip.

## 2011-04-12 NOTE — H&P (Signed)
[name] is an 60 y.o. male.   Chief Complaint: Pain for at least 2 days  HPI: 60 year old white married male presented to [facility] emergency room today with chest pain. He has a cardiac history dating back to 1995 when he had stents placed x2 and then again in 2000.Those were not done in [place].in 2009 he presented with an ST elevation MI undergoing PTCA and stent of the proximal LAD, PTCA of the in-stent restenotic mid LAD lesion and rescue of the stent jailed diagonal one branch of the LAD. Residual coronary disease included mild luminal irregularities of the RCA left circumflex with large obtuse marginal 1 with 50% stenosis, 40 and mid 50% stenosis of the obtuse marginal at the bifurcation and distal circumflex revealed bifurcating 80-90% stenosis that these branches were small and probably not amenable for balloon angioplasty. Also a small AV groove branch the circumflex shows a focal 90% ostial stenosis. There were faint collaterals from the RCA to the to the distal circumflex.his ejection fraction at that time was 40-50%.  He has had one other admission for chest pain in 2011 was to have a followup stress test but he had to cancel and had never rescheduled. Over the last 2 weeks he has had increased stress and his family life he and his wife are separating and he's had chest pain for approximately 2 weeks. Then 2 days ago it was more crisis time at home and his chest pain increased.. Today he presents with again increased chest pain described as a bowling balls size pain just left of center without radiation to his neck shoulders or back no shortness of breath and mild diaphoresis and mild nausea.  Here in the emergency room he was given 4 baby aspirin and nitroglycerin sublingual with relief of this chest pain.   Past Medical History  Diagnosis Date  . Myocardial infarction   . Hypertension   . CAD (coronary artery disease) 04/12/2011  . HTN (hypertension) 04/12/2011  . Hyperlipemia  04/12/2011  . Angina     Past Surgical History  Procedure Date  . Coronary stent placement   . Tonsillectomy and adenoidectomy     Family History  Problem Relation Age of Onset  . COPD Mother   . Cancer Father   . Stroke Father   . Stroke Sister    Social History:  reports that he quit smoking about 3 years ago. He has never used smokeless tobacco. He reports that he drinks alcohol. He reports that he uses illicit drugs (Marijuana).  Allergies:  Allergies  Allergen Reactions  . Atarax (Hydroxyzine Hcl)     Weakness, out of this world feeling.    Medications Prior to Admission  Medication Dose Route Frequency Provider Last Rate Last Dose  . 0.9 %  sodium chloride infusion   Intravenous Continuous Leone Brand, NP      . aspirin chewable tablet 324 mg  324 mg Oral Once Gwyneth Sprout, MD   324 mg at 04/12/11 1239  . nitroGLYCERIN (NITROSTAT) SL tablet 0.4 mg  0.4 mg Sublingual Q5 min PRN Gwyneth Sprout, MD   0.4 mg at 04/12/11 1241  . nitroGLYCERIN 0.2 mg/mL in dextrose 5 % infusion  2-200 mcg/min Intravenous Titrated Leone Brand, NP       No current outpatient prescriptions on file as of 04/12/2011.    Results for orders placed during the hospital encounter of 04/12/11 (from the past 48 hour(s))  CBC     Status: Normal  Collection Time   04/12/11 12:39 PM      Component Value Range Comment   WBC 10.2  4.0 - 10.5 (K/uL)    RBC 5.07  4.22 - 5.81 (MIL/uL)    Hemoglobin 16.2  13.0 - 17.0 (g/dL)    HCT 16.1  09.6 - 04.5 (%)    MCV 90.1  78.0 - 100.0 (fL)    MCH 32.0  26.0 - 34.0 (pg)    MCHC 35.4  30.0 - 36.0 (g/dL)    RDW 40.9  81.1 - 91.4 (%)    Platelets 194  150 - 400 (K/uL)   DIFFERENTIAL     Status: Normal   Collection Time   04/12/11 12:39 PM      Component Value Range Comment   Neutrophils Relative 61  43 - 77 (%)    Neutro Abs 6.2  1.7 - 7.7 (K/uL)    Lymphocytes Relative 30  12 - 46 (%)    Lymphs Abs 3.0  0.7 - 4.0 (K/uL)    Monocytes Relative 6  3  - 12 (%)    Monocytes Absolute 0.6  0.1 - 1.0 (K/uL)    Eosinophils Relative 3  0 - 5 (%)    Eosinophils Absolute 0.3  0.0 - 0.7 (K/uL)    Basophils Relative 1  0 - 1 (%)    Basophils Absolute 0.1  0.0 - 0.1 (K/uL)   BASIC METABOLIC PANEL     Status: Abnormal   Collection Time   04/12/11 12:39 PM      Component Value Range Comment   Sodium 141  135 - 145 (mEq/L)    Potassium 3.7  3.5 - 5.1 (mEq/L)    Chloride 103  96 - 112 (mEq/L)    CO2 28  19 - 32 (mEq/L)    Glucose, Bld 104 (*) 70 - 99 (mg/dL)    BUN 14  6 - 23 (mg/dL)    Creatinine, Ser 7.82  0.50 - 1.35 (mg/dL)    Calcium 9.6  8.4 - 10.5 (mg/dL)    GFR calc non Af Amer >90  >90 (mL/min)    GFR calc Af Amer >90  >90 (mL/min)   CARDIAC PANEL(CRET KIN+CKTOT+MB+TROPI)     Status: Normal   Collection Time   04/12/11 12:39 PM      Component Value Range Comment   Total CK 98  7 - 232 (U/L)    CK, MB 3.2  0.3 - 4.0 (ng/mL)    Troponin I <0.30  <0.30 (ng/mL)    Relative Index RELATIVE INDEX IS INVALID  0.0 - 2.5    PROTIME-INR     Status: Normal   Collection Time   04/12/11 12:39 PM      Component Value Range Comment   Prothrombin Time 13.2  11.6 - 15.2 (seconds)    INR 0.98  0.00 - 1.49    APTT     Status: Normal   Collection Time   04/12/11 12:39 PM      Component Value Range Comment   aPTT 31  24 - 37 (seconds)   URINALYSIS, ROUTINE W REFLEX MICROSCOPIC     Status: Normal   Collection Time   04/12/11  1:37 PM      Component Value Range Comment   Color, Urine YELLOW  YELLOW     APPearance CLEAR  CLEAR     Specific Gravity, Urine 1.020  1.005 - 1.030     pH 7.5  5.0 - 8.0  Glucose, UA NEGATIVE  NEGATIVE (mg/dL)    Hgb urine dipstick NEGATIVE  NEGATIVE     Bilirubin Urine NEGATIVE  NEGATIVE     Ketones, ur NEGATIVE  NEGATIVE (mg/dL)    Protein, ur NEGATIVE  NEGATIVE (mg/dL)    Urobilinogen, UA 1.0  0.0 - 1.0 (mg/dL)    Nitrite NEGATIVE  NEGATIVE     Leukocytes, UA NEGATIVE  NEGATIVE  MICROSCOPIC NOT DONE ON URINES WITH  NEGATIVE PROTEIN, BLOOD, LEUKOCYTES, NITRITE, OR GLUCOSE <1000 mg/dL.  CARDIAC PANEL(CRET KIN+CKTOT+MB+TROPI)     Status: Normal   Collection Time   04/12/11  3:55 PM      Component Value Range Comment   Total CK 85  7 - 232 (U/L)    CK, MB 2.8  0.3 - 4.0 (ng/mL)    Troponin I <0.30  <0.30 (ng/mL)    Relative Index RELATIVE INDEX IS INVALID  0.0 - 2.5     Dg Chest Port 1 View  04/12/2011  *RADIOLOGY REPORT*  Clinical Data: Chest pain.  PORTABLE CHEST - 1 VIEW  Comparison: 11/02/2009  Findings: Portable semi upright view of the chest demonstrates clear lungs. Heart and mediastinum are within normal limits.  The trachea is midline.  Bony structures are intact.  IMPRESSION: No acute chest findings.  Original Report Authenticated By: Richarda Overlie, M.D.    ROS: General: No colds or fevers. Increased stress at home. Skin no rashes or ulcers. HEENT: No blurred vision or double vision. Cardiovascular see history of present illness Pulmonary: Denies shortness of breath. GI: No diarrhea constipation or melena no indigestion. GU: Denies dysuria or hematuria. Neurology: Occasional lightheadedness from going to sitting to standing but otherwise no syncope no dizziness. Musculoskeletal: Does have chronic lower back pain.   Blood pressure 112/64, pulse 75, temperature 98.1 F (36.7 C), temperature source Oral, resp. rate 18, SpO2 100.00%. PE: General: alert, oriented, white male, in no acute distress. Pleasant affect. Skin: Warm and dry brisk capillary refill. HEENT: Normocephalic, sclera clear. Neck: Supple no JVD, no bruits. Heart: S1-S2, regular rate and rhythm, without murmur gallop rub or click. Lungs: Clear without rales rhonchi or wheezes. Abdomen: Soft nontender positive bowel sounds do not palpate liver spleen or masses. Extremities: 2+ pedal pulses bilaterally no lower extremity edema. Neuro: Alert and oriented x3, moves all extremities follows commands.  Assessment/Plan Patient Active  Problem List  Diagnoses  . Angina at rest  . CAD (coronary artery disease)  . HTN (hypertension)  . Hyperlipemia   Plan: Seen and evaluated by Dr. Nanetta Batty. With patient's history of significant coronary artery disease we'll admit him to step down unit begin IV nitroglycerin and IV heparin. We'll do serial cardiac enzymes and EKGs with plans for cardiac catheterization on 04/14/2011.  EKG was sinus rhythm without acute changes and cardiac enzymes have been negative thus far. Patient is agreeable to this plan of action.  AVWUJW,JXBJY R 04/12/2011, 5:08 PM   Agree with note written by Nada Boozer RNP  The patient is a 60 year old married white male who presents to [facility] ER with chest pain. He has a history of CAD status post stenting back in 2000. He was restudied in the setting of an ST segment elevation myocardial infarction in 2009 and had a stenting of his LAD. He did have mild to moderate LV dysfunction. This was in addition to hypertension and hyperlipidemia. He is undergoing a lot of personal stress recently with separation from his wife. He is accelerated angina  which crescendo today similar to his prior symptoms. The emergency room he continues to have low-grade chest pain. His exam is benign and his EKG shows no acute changes. His enzymes are negative and the remainder of his laboratory exam is unremarkable. Given his history of known CAD and the crescendo nature of her symptoms despite his personal situation with associated anxiety and going to admit him to the step down unit, rule out myocardial infarction, and place him on IV nitroglycerin and heparin. He will undergo diagnostic coronary artery arteriography on Monday. The risks and benefits were explained the patient agrees.   Runell Gess 04/13/2011 7:59 AM

## 2011-04-13 LAB — LIPID PANEL
Cholesterol: 125 mg/dL (ref 0–200)
HDL: 34 mg/dL — ABNORMAL LOW (ref >39)
LDL Cholesterol: 77 mg/dL (ref 0–99)
Total CHOL/HDL Ratio: 3.6 RATIO
Triglycerides: 65 mg/dL (ref <150)
VLDL: 13 mg/dL (ref 0–40)

## 2011-04-13 LAB — BASIC METABOLIC PANEL
CO2: 25 mEq/L (ref 19–32)
Chloride: 104 mEq/L (ref 96–112)
GFR calc Af Amer: >90 mL/min (ref >90)
Sodium: 135 mEq/L (ref 135–145)

## 2011-04-13 LAB — HEPATIC FUNCTION PANEL
ALT: 26 U/L (ref 0–53)
Indirect Bilirubin: 1.8 mg/dL — ABNORMAL HIGH (ref 0.3–0.9)
Total Protein: 6.4 g/dL (ref 6.0–8.3)

## 2011-04-13 LAB — CBC
Platelets: 159 10*3/uL (ref 150–400)
RBC: 4.95 MIL/uL (ref 4.22–5.81)
WBC: 13.2 10*3/uL — ABNORMAL HIGH (ref 4.0–10.5)

## 2011-04-13 LAB — TSH: TSH: 1.342 u[IU]/mL (ref 0.350–4.500)

## 2011-04-13 LAB — CARDIAC PANEL(CRET KIN+CKTOT+MB+TROPI)
Relative Index: INVALID (ref 0.0–2.5)
Troponin I: <0.3 ng/mL (ref <0.30)

## 2011-04-13 LAB — HEPARIN LEVEL (UNFRACTIONATED): Heparin Unfractionated: 0.61 IU/mL (ref 0.30–0.70)

## 2011-04-13 MED ORDER — DIAZEPAM 5 MG PO TABS
5.0000 mg | ORAL_TABLET | ORAL | Status: AC
  Administered 2011-04-14: 5 mg via ORAL
  Filled 2011-04-13: qty 1

## 2011-04-13 NOTE — Progress Notes (Signed)
Pt. Has his home cpap. RN stated that she notified [employer] to come inspect the pt. Cpap, but they are unable to respond tonight. RT checked pt. cpap and there were no frayed wires. Pt. States that he has to wear his cpap or he wont be able to sleep.

## 2011-04-13 NOTE — Progress Notes (Signed)
Subjective: No chest pain.  60 year old white married male presented to [facility] emergency room12/1/12 with chest pain. He has a cardiac history dating back to 1995 when he had stents placed x2 and then again in 2000.Those were not done in [place].in 2009 he presented with an ST elevation MI undergoing PTCA and stent of the proximal LAD, PTCA of the in-stent restenotic mid LAD lesion and rescue of the stent jailed diagonal one branch of the LAD. Residual coronary disease included mild luminal irregularities of the RCA left circumflex with large obtuse marginal 1 with 50% stenosis, 40 and mid 50% stenosis of the obtuse marginal at the bifurcation and distal circumflex revealed bifurcating 80-90% stenosis that these branches were small and probably not amenable for balloon angioplasty. Also a small AV groove branch the circumflex shows a focal 90% ostial stenosis. There were faint collaterals from the RCA to the to the distal circumflex.his ejection fraction at that time was 40-50%.  Objective: Vital signs in last 24 hours: Temp:  [97.3 F (36.3 C)-99.8 F (37.7 C)] 99.8 F (37.7 C) (12/02 0400) Pulse Rate:  [67-86] 82  (12/02 0400) Resp:  [11-19] 18  (12/02 0400) BP: (98-119)/(52-90) 104/70 mmHg (12/02 0340) SpO2:  [96 %-100 %] 97 % (12/02 0400) Weight:  [93.8 kg (206 lb 12.7 oz)-95.255 kg (210 lb)] 206 lb 12.7 oz (93.8 kg) (12/02 0432) Weight change:    Intake/Output from previous day:  +497 12/01 0701 - 12/02 0700 In: 1029.7 [P.O.:600; I.V.:429.7] Out: 500 [Urine:500] Intake/Output this shift:    PE: General: Heart: Lungs: Abd: Ext: IV heparin, IV NTG continue.      Lab Results:  Basename 04/13/11 0500 04/12/11 1239  WBC 13.2* 10.2  HGB 15.8 16.2  HCT 45.2 45.7  PLT 159 194   BMET  Basename 04/13/11 0500 04/12/11 1239  NA 135 141  K 4.1 3.7  CL 104 103  CO2 25 28  GLUCOSE 145* 104*  BUN 16 14  CREATININE 0.82 0.82  CALCIUM 9.2 9.6    Basename 04/12/11 2339  04/12/11 2156  TROPONINI <0.30 <0.30    Lab Results  Component Value Date   CHOL 125 04/13/2011   CHOL 125 04/13/2011   HDL 35* 04/13/2011   HDL 34* 04/13/2011   LDLCALC 77 04/13/2011   LDLCALC 78 04/13/2011   TRIG 66 04/13/2011   TRIG 65 04/13/2011   CHOLHDL 3.6 04/13/2011   CHOLHDL 3.7 04/13/2011   No results found for this basename: HGBA1C     No results found for this basename: TSH    Hepatic Function Panel  Basename 04/13/11 0500  PROT 6.4  ALBUMIN 3.6  AST 18  ALT 26  ALKPHOS 64  BILITOT 2.0*  BILIDIR 0.2  IBILI 1.8*    Basename 04/13/11 0500  CHOL 125125   No results found for this basename: PROTIME in the last 72 hours    EKG: Orders placed during the hospital encounter of 04/12/11  . EKG 12-LEAD  . EKG 12-LEAD  . EKG 12-LEAD    Studies/Results: Dg Chest Port 1 View  04/12/2011  *RADIOLOGY REPORT*  Clinical Data: Chest pain.  PORTABLE CHEST - 1 VIEW  Comparison: 11/02/2009  Findings: Portable semi upright view of the chest demonstrates clear lungs. Heart and mediastinum are within normal limits.  The trachea is midline.  Bony structures are intact.  IMPRESSION: No acute chest findings.  Original Report Authenticated By: Richarda Overlie, M.D.    Medications: I have reviewed the patient's current  medications.  Assessment/Plan: Patient Active Problem List  Diagnoses  . Angina at rest  . CAD (coronary artery disease)  . HTN (hypertension)  . Hyperlipemia   PLAN:  Neg MI, plan for cardiac cath on Monday.  MD to see.  LOS: 1 day   INGOLD,LAURA R 04/13/2011, 7:41 AM  Agree with note written by Nada Boozer RNP  No recurrent CP. Enz. Neg. On IV Hep/NTG. For cath tomorrow.  Runell Gess 04/13/2011 10:15 AM

## 2011-04-13 NOTE — Progress Notes (Signed)
ANTICOAGULATION CONSULT NOTE - Follow Up Consult  Pharmacy Consult for Heparin Indication: chest pain/ACS  Allergies  Allergen Reactions  . Atarax (Hydroxyzine Hcl)     Weakness, out of this world feeling.    Patient Measurements: Height: 5\' 8"  (172.7 cm) Weight: 210 lb (95.255 kg) IBW/kg (Calculated) : 68.4  Heparin Dosing Weight: 88.4 kg  Vital Signs: Temp: 97.7 F (36.5 C) (12/02 0000) Temp src: Oral (12/02 0000) BP: 110/69 mmHg (12/01 2300) Pulse Rate: 74  (12/01 2300)  Labs:  Basename 04/12/11 2339 04/12/11 2156 04/12/11 1555 04/12/11 1239  HGB -- -- -- 16.2  HCT -- -- -- 45.7  PLT -- -- -- 194  APTT -- -- -- 31  LABPROT -- -- -- 13.2  INR -- -- -- 0.98  HEPARINUNFRC 0.61 -- -- --  CREATININE -- -- -- 0.82  CKTOTAL 76 73 85 --  CKMB 2.7 2.5 2.8 --  TROPONINI <0.30 <0.30 <0.30 --   Estimated Creatinine Clearance: 107.3 ml/min (by C-G formula based on Cr of 0.82).  Goal of Therapy:  Heparin level 0.3-0.7 units/ml  Assessment: 60 yo M initiated on heparin for CP/ACS.  Heparin level drawn early (~5 hours after start) is therapeutic.  Level may decrease slightly at steady state.   Plan:  Continue heparin at 1200 units/hr.  Follow-up with AM labs to confirm.  Capri Raben, Judie Bonus 04/13/2011,1:43 AM

## 2011-04-13 NOTE — Progress Notes (Signed)
ANTICOAGULATION CONSULT NOTE - Follow Up Consult  Pharmacy Consult for Heparin Indication: chest pain/ACS  Allergies  Allergen Reactions  . Atarax (Hydroxyzine Hcl)     Weakness, out of this world feeling.    Patient Measurements: Height: 5\' 8"  (172.7 cm) Weight: 206 lb 12.7 oz (93.8 kg) IBW/kg (Calculated) : 68.4  Heparin Dosing Weight: 88.4 kg  Vital Signs: Temp: 97.8 F (36.6 C) (12/02 1214) Temp src: Oral (12/02 1214) BP: 111/70 mmHg (12/02 1214) Pulse Rate: 81  (12/02 1214)  Labs:  Basename 04/13/11 0815 04/13/11 0500 04/12/11 2339 04/12/11 2156 04/12/11 1239  HGB -- 15.8 -- -- 16.2  HCT -- 45.2 -- -- 45.7  PLT -- 159 -- -- 194  APTT -- -- -- -- 31  LABPROT -- -- -- -- 13.2  INR -- -- -- -- 0.98  HEPARINUNFRC -- -- 0.61 -- --  CREATININE -- 0.82 -- -- 0.82  CKTOTAL 58 -- 76 73 --  CKMB 2.3 -- 2.7 2.5 --  TROPONINI <0.30 -- <0.30 <0.30 --   Estimated Creatinine Clearance: 106.5 ml/min (by C-G formula based on Cr of 0.82).  Goal of Therapy:  Heparin level 0.3-0.7 units/ml  Assessment: 60 yo M initiated on heparin for CP/ACS.  Heparin level early AM is therapeutic.  Plans for cardiac cath in AM as indicated.   Plan:  1.  Continue heparin at 1200 units/hr.   2.  Follow-up with AM labs to confirm.   Nadara Mustard, PharmD., MS 04/13/2011,3:51 PM

## 2011-04-14 ENCOUNTER — Encounter (HOSPITAL_COMMUNITY): Payer: Self-pay | Admitting: Internal Medicine

## 2011-04-14 ENCOUNTER — Other Ambulatory Visit: Payer: Self-pay

## 2011-04-14 ENCOUNTER — Encounter (HOSPITAL_COMMUNITY)
Admission: EM | Disposition: A | Discharge status: Home / Self Care | Payer: Self-pay | Source: Ambulatory Visit | Attending: Cardiovascular Disease

## 2011-04-14 HISTORY — PX: LEFT HEART CATHETERIZATION WITH CORONARY ANGIOGRAM: SHX5451

## 2011-04-14 LAB — POCT ACTIVATED CLOTTING TIME: Activated Clotting Time: 204 seconds

## 2011-04-14 LAB — BASIC METABOLIC PANEL
BUN: 14 mg/dL (ref 6–23)
CO2: 25 mEq/L (ref 19–32)
Chloride: 107 mEq/L (ref 96–112)
Creatinine, Ser: 0.81 mg/dL (ref 0.50–1.35)

## 2011-04-14 LAB — CBC
HCT: 43.2 % (ref 39.0–52.0)
Hemoglobin: 14.6 g/dL (ref 13.0–17.0)
MCHC: 33.8 g/dL (ref 30.0–36.0)
MCV: 90.6 fL (ref 78.0–100.0)
RDW: 12.9 % (ref 11.5–15.5)

## 2011-04-14 SURGERY — LEFT HEART CATHETERIZATION WITH CORONARY ANGIOGRAM
Anesthesia: LOCAL

## 2011-04-14 MED ORDER — PRASUGREL HCL 10 MG PO TABS
10.0000 mg | ORAL_TABLET | Freq: Every day | ORAL | Status: DC
  Administered 2011-04-15: 10 mg via ORAL
  Filled 2011-04-14: qty 1

## 2011-04-14 MED ORDER — PRASUGREL HCL 10 MG PO TABS: 10.0000 mg | ORAL_TABLET | Freq: Every day | ORAL | Status: DC

## 2011-04-14 MED ORDER — VERAPAMIL HCL 2.5 MG/ML IV SOLN
INTRAVENOUS | Status: AC
  Filled 2011-04-14: qty 4

## 2011-04-14 MED ORDER — HEPARIN (PORCINE) IN NACL 2-0.9 UNIT/ML-% IJ SOLN
INTRAMUSCULAR | Status: AC
  Filled 2011-04-14: qty 2000

## 2011-04-14 MED ORDER — ONDANSETRON HCL 4 MG/2ML IJ SOLN: 4.0000 mg | Freq: Four times a day (QID) | INTRAMUSCULAR | Status: DC | PRN

## 2011-04-14 MED ORDER — HEPARIN SODIUM (PORCINE) 1000 UNIT/ML IJ SOLN
INTRAMUSCULAR | Status: AC
  Filled 2011-04-14: qty 1

## 2011-04-14 MED ORDER — CHLORHEXIDINE GLUCONATE 4 % EX LIQD
CUTANEOUS | Status: AC
  Administered 2011-04-14: 10:00:00
  Filled 2011-04-14: qty 15

## 2011-04-14 MED ORDER — BIVALIRUDIN 250 MG IV SOLR
INTRAVENOUS | Status: AC
  Filled 2011-04-14: qty 250

## 2011-04-14 MED ORDER — MORPHINE SULFATE 2 MG/ML IJ SOLN
2.0000 mg | INTRAMUSCULAR | Status: DC | PRN
  Administered 2011-04-14: 2 mg via INTRAVENOUS

## 2011-04-14 MED ORDER — NITROGLYCERIN IN D5W 200-5 MCG/ML-% IV SOLN: 2.0000 ug/min | INTRAVENOUS | Status: DC

## 2011-04-14 MED ORDER — ADENOSINE 12 MG/4ML IV SOLN
16.0000 mL | Freq: Once | INTRAVENOUS | Status: DC
  Filled 2011-04-14: qty 16

## 2011-04-14 MED ORDER — MIDAZOLAM HCL 2 MG/2ML IJ SOLN
INTRAMUSCULAR | Status: AC
  Filled 2011-04-14: qty 2

## 2011-04-14 MED ORDER — FENTANYL CITRATE 0.05 MG/ML IJ SOLN
INTRAMUSCULAR | Status: AC
  Filled 2011-04-14: qty 2

## 2011-04-14 MED ORDER — LIDOCAINE HCL (PF) 1 % IJ SOLN
INTRAMUSCULAR | Status: AC
  Filled 2011-04-14: qty 30

## 2011-04-14 MED ORDER — NITROGLYCERIN 0.2 MG/ML ON CALL CATH LAB
INTRAVENOUS | Status: AC
  Filled 2011-04-14: qty 1

## 2011-04-14 MED ORDER — SODIUM CHLORIDE 0.9 % IV SOLN: 1.0000 mL/kg/h | INTRAVENOUS | Status: AC

## 2011-04-14 MED ORDER — MORPHINE SULFATE 4 MG/ML IJ SOLN
INTRAMUSCULAR | Status: AC
  Filled 2011-04-14: qty 1

## 2011-04-14 NOTE — Plan of Care (Signed)
Problem: Phase I Progression Outcomes Goal: Hemodynamically stable Outcome: Completed/Met Date Met:  04/14/11 Patient hemodynamically stable, only on nitroglycerin for chest pain.

## 2011-04-14 NOTE — Progress Notes (Signed)
Pt. Seen and examined. Agree with the NP/PA-C note as written.  Plan for LHC radially this am.  Chrystie Nose, MD Attending Cardiologist [facility]

## 2011-04-14 NOTE — Interval H&P Note (Signed)
History and Physical Interval Note:  04/14/2011 10:12 AM  [name]  has presented today for surgery, with the diagnosis of chest pain  The various methods of treatment have been discussed with the patient and family. After consideration of risks, benefits and other options for treatment, the patient has consented to  Procedure(s): LEFT HEART CATHETERIZATION WITH CORONARY ANGIOGRAM as a surgical intervention .  The patients' history has been reviewed, patient examined, no change in status, stable for surgery.  I have reviewed the patients' chart and labs.  Questions were answered to the patient's satisfaction.     HILTY,Kenneth C

## 2011-04-14 NOTE — Op Note (Signed)
[facility]     CARDIAC CATHETERIZATION REPORT  NAME:  [name]     MRN: [id number] DATE OF BIRTH:  19-Dec-1950   ADMIT DATE:  04/12/2011  Performing Cardiologist: Marykay Lex, M.D., M.S. Primary Physician: Provider Not In System Primary Cardiologist:  [facility]  Procedures Performed:  Fractional Flow Reserve Measurement of Mid LAD in stent re-stenosis   Percutaneous coronary intervention with 2 Overlapping Promus DES stents - 3.5 mm x 12 mm placed at distal edge of stented area, 3.5 mm x 24 mm Promus DES overlapping into more proximal stented area.  Intracoronary nitroglycerin injection -- 200 mcg, x1  Indication(s): Unstable Angina - known CAD  History: 60 y.o. male with a history of coronary artery disease and prior stenting to the right coronary artery as well as the LAD. And she does not he presented with acute ST elevation MI and was found to have 90% in-stent restenosis of the of proximal LAD as well as thrombus. He underwent sequential 3.5 x 23 mm Promus DES, postdilated 3.75 mm. Unfortunately this jailed a diagonal branch, then it was successfully rescued with a buddy wire. Subsequent "kissing balloon" angioplasty was performed. There is a previously placed stent in the diagonal. He is reported that he is under a significant amount of stress recently. He now presents with symptoms of unstable angina, chest pressure, and burning. He is referred for left heart catheterization performed by Dr. Rennis Golden - see his full note for details.    Consent:  Risks of procedure as well as the alternatives and risks of each were explained to the (patient/caregiver).  Consent for procedure obtained.  Consent for possible PCI was obtained during initial consent for cardiac catheterization.  PERCUTANEOUS CORONARY INTERVENTION PROCEDURE I was asked to review his angiographic images to determine appropriate course of action.  We decided to perform FFR measurement  of the LAD in-stent restenosis and proceed with angioplasty +/- stent if indicated.  The 5Fr Sheath radial sheath was exchanged over a wire for a 6Fr sheath.  I used a 5 Fr JR4 diagnostic catheter to advance the Long Safety J wire into the ascending Aorta.   An ACT was checked - ~200 sec, therefore additional Heparin 2000 Units was administered.  A 6Fr EBU 3.5 Guide catheter was advanced over a J-wire and used to engage the left Coronary Artery.  A [place] Fractional [employer] wire was advanced just out of the guide catheter for normalization.  It was then advanced to the distal LAD.   Adenosine infusion (140 mcg/kg/min) was then administered for a total of 90 sec while FFR ratio was being evaluated.  A peak ratio of 0.75 was recorded -- hemodynamically significant.  Therefore, the decision was to proceed, initially, with cutting balloon angioplasty. Based upon the previous data on stent size of 3.75 mm after post-dilation per report (images were not able to be pulled out of archive), I chose a 3. 75 mm Cutting balloon.   A weight based bolus of IV Angiomax was administered and the drip was continued until completion of the procedure. Lesion #1:  Mid LAD long tubular ISR ~60-70%, FFR 0.75 (appears to be old BMS stent)  Pre-PCI Stenosis: 60-70 % Post-PCI Stenosis: 0 %     TIMI 3 flow       TIMI 3 flow  Guide Catheter: EBU 3.5   Guidewire: FFR wire in place after completing FFR  Pre-Dilitation Balloon: 3.75 mm x  Mm [place] Cutting  balloon   1st Inflation:  6 Atm for 60 Sec -- distal stented segment.   2nd Inflation: 12 Atm for 60 Sec -- mid stented segment  3rd Inflation: 12 Atm for 30 Sec -- proximal stented segment at the site of most significant narrowing. Scout angiography did reveal evidence of dissection with contrast staining behind the previously placed stent. No perforation. TIMI 3 flow. I then planned to "tack" down the dissection with noncompliant balloon. Pre-Dilitation Balloon:  3.75 mm x  Mm [place] Trek balloon   1st Inflation:   8 Atm for 120 Sec - distal stented   2nd Inflation: 12  Atm for 120 Sec --  mid stented segment   3rd Inflation: 12 Atm for 110 Sec -- proximal stented segment at the site of most significant narrowing. Scout angiography revealed persistence of dissection with contrast staining behind the previously placed stent. No perforation. TIMI 3 flow.  I therefore decided to proceed with stenting the distal segment as it appeared to be retrograde staining posterior to the previously placed bare-metal helical coil stent.  Stent #1 : Promus DES 3.5 mm x 12 mm - at distal edge of ballooned segment / distal edge of apparent dissection.   Deployment:   11 Atm for 45 Sec  -- filed at 3.6 mm;    2nd Inflation:   12 Atm for 45 Sec -- overlapping proximal edge of new stent into prior stent  Scout angiography did reveal evidence of dissection with contrast staining behind the previously placed stent. No perforation. TIMI 3 flow.  There was no antegrade staining, or retrograde staining more proximal than the helical coil stent.  Stent #2 : Promus DES 3.5 mm x 24 mm - at distal edge of ballooned segment / distal edge of apparent dissection.   Deployment:   14 Atm for 50 Sec   2nd Inflation: 60 Atm for 45 Sec  Scout angiography now did not reveal evidence of dissection or perforation  Post deployment angiography with and without guidewire in place did not reveal evidence of dissection or perforation.  The patient did have post deployment chest discomfort for 4/10. Intracoronary nitroglycerin injection resulted in some relief of pain.  There is no evidence of dissection or perforation, and by the time of his transferred to the PACU he was pain-free.  The patient was transported to the PACU --> TCU in stable condition.  He did  The patient  was stable before, during and following the procedure.   Patient did tolerate procedure well. There were complications.  Unexpected  possible dissection behind previously placed stent - consider stent strut fracture during balloon inflation. EBL: <20ml  Medications:  Additional Sedation:  1 mg IV Versed,   Total combined contrast:  275 milliliters Omnipaque  Radial cocktail: 5 mL (Radial Cocktail: 5 mg Verapamil, 400 mcg NTG, 2 ml 2% Lidocaine --  in 10ml NS)  Angiomax bolus and drip - 5 mg per 1 mL 0.9 normal saline,     bolus: 0.75 mg / kilogram; infusion rate: 1.75 mg/kilogram/hour   Impression: 1.  Hemodynamically significant tubular LAD In-stent stenosis by FFR - 0.75 2.  Successful PCI with 2 Overlapping Promus DES 3.5 mm Stents - overlapping prior stents (15mm & 24 mm)  Plan: 1.  Given extent of stents, consider converting from Plavix to Brilinta 2.  Monitor overnight after sheath pull, likely d/c in AM if stable.   The case and results was discussed with the patient (and family). The case  and results was not discussed with the patient's PCP. The case and results was discussed with the patient's Cardiologist.  Time Spend Directly with Patient:  1 hr 30 minutes  Moyses Pavey W, M.D., M.S. [facility] [address] [place], [place]  [address]  [phone]

## 2011-04-14 NOTE — Progress Notes (Signed)
Subjective:60 year old white married male presented to [facility] emergency room today with chest pain. He has a cardiac history dating back to 1995 when he had stents placed x2 and then again in 2000.Those were not done in [place].in 2009 he presented with an ST elevation MI undergoing PTCA and stent of the proximal LAD, PTCA of the in-stent restenotic mid LAD lesion and rescue of the stent jailed diagonal one branch of the LAD.  Currently no pain.   Objective: Vital signs in last 24 hours: Temp:  [97.8 F (36.6 C)-98.3 F (36.8 C)] 98.3 F (36.8 C) (12/03 0850) Pulse Rate:  [81-83] 83  (12/02 1627) Resp:  [16-20] 20  (12/03 0850) BP: (90-113)/(57-70) 113/70 mmHg (12/03 0850) SpO2:  [93 %-99 %] 98 % (12/03 0850) Weight change:    Intake/Output from previous day: +903 12/02 0701 - 12/03 0700 In: 1144.4 [P.O.:360; I.V.:784.4] Out: 250 [Urine:250] Intake/Output this shift: Total I/O In: 23.1 [I.V.:23.1] Out: -   PE: General Alert oriented Heart: S1S2 RRR Lungs: Clear ABD: +BS EXT: No edema  Lab Results:  Basename 04/14/11 0720 04/13/11 0500  WBC 5.8 13.2*  HGB 14.6 15.8  HCT 43.2 45.2  PLT 165 159   BMET  Basename 04/14/11 0720 04/13/11 0500  NA 140 135  K 3.9 4.1  CL 107 104  CO2 25 25  GLUCOSE 137* 145*  BUN 14 16  CREATININE 0.81 0.82  CALCIUM 9.0 9.2    Basename 04/13/11 0815 04/12/11 2339  TROPONINI <0.30 <0.30    Lab Results  Component Value Date   CHOL 125 04/13/2011   CHOL 125 04/13/2011   HDL 35* 04/13/2011   HDL 34* 04/13/2011   LDLCALC 77 04/13/2011   LDLCALC 78 04/13/2011   TRIG 66 04/13/2011   TRIG 65 04/13/2011   CHOLHDL 3.6 04/13/2011   CHOLHDL 3.7 04/13/2011   Lab Results  Component Value Date   HGBA1C 6.9* 04/13/2011     Lab Results  Component Value Date   TSH 1.342 04/13/2011    Hepatic Function Panel  Basename 04/13/11 0500  PROT 6.4  ALBUMIN 3.6  AST 18  ALT 26  ALKPHOS 64  BILITOT 2.0*  BILIDIR 0.2  IBILI 1.8*     Basename 04/13/11 0500  CHOL 125125   No results found for this basename: PROTIME in the last 72 hours    EKG: Orders placed during the hospital encounter of 04/12/11  . EKG 12-LEAD  . EKG 12-LEAD  . EKG 12-LEAD    Studies/Results: Dg Chest Port 1 View  04/12/2011  *RADIOLOGY REPORT*  Clinical Data: Chest pain.  PORTABLE CHEST - 1 VIEW  Comparison: 11/02/2009  Findings: Portable semi upright view of the chest demonstrates clear lungs. Heart and mediastinum are within normal limits.  The trachea is midline.  Bony structures are intact.  IMPRESSION: No acute chest findings.  Original Report Authenticated By: Richarda Overlie, M.D.    Medications: I have reviewed the patient's current medications.  Assessment/Plan: Patient Active Problem List  Diagnoses  . Angina at rest  . CAD (coronary artery disease)  . HTN (hypertension)  . Hyperlipemia   PLAN:For cardiac cath today.  Cardiac enzymes have all been negative.  Futher plan of care to be determined after cath.  GLYCOHGB elevated:  Will change diet to diabetic.  LOS: 2 days   Danial Hlavac R 04/14/2011, 9:38 AM

## 2011-04-14 NOTE — Op Note (Signed)
[facility]     CARDIAC CATHETERIZATION REPORT  [name]   [id number] November 03, 1950  Performing Cardiologist: Chrystie Nose Primary Physician: Provider Not In System Primary Cardiologist:  [facility]  Procedures Performed:  Left Heart Catheterization via 5 Fr right radial access  Left Ventriculography, (RAO/LAO) 15 ml/sec for 35 ml total contrast  Native Coronary Angiography  Indication(s): Unstable angina  History: 60 y.o. male with a history of coronary artery disease and prior stenting to the right coronary artery as well as the LAD. And she does not he presented with acute ST elevation MI and was found to have 90% in-stent restenosis of the of proximal LAD as well as thrombus. He underwent sequential 3.5 x 23 mm Promus DES, postdilated 3.75 mm. Unfortunately this jailed a diagonal branch, then it was successfully rescued with a buddy wire. Subsequent "kissing balloon" angioplasty was performed. There is a previously placed stent in the diagonal. He is reported that he is under a significant amount of stress recently. He now presents with symptoms of unstable angina, chest pressure, and burning. He is referred for left heart catheterization.  Consent: The procedure with Risks/Benefits/Alternatives and Indications was reviewed with the patient.  All questions were answered.    Risks / Complications include, but not limited to: Death, MI, CVA/TIA, VF/VT (with defibrillation), Bradycardia (need for temporary pacer placement), contrast induced nephropathy, bleeding / bruising / hematoma / pseudoaneurysm, vascular or coronary injury (with possible emergent CT or Vascular Surgery), adverse medication reactions, infection.    The patient voice understanding and agree to proceed.    Risks of procedure as well as the alternatives and risks of each were explained to the (patient/caregiver).  Consent for procedure obtained. Consent for signed by MD and patient  with RN witness -- placed on chart.  Procedure: The patient was brought to the 2nd Floor [facility] in the fasting state and prepped and draped in the usual sterile fashion for (Right radial) access. A modified Allen's test with plethysmography was performed on the right wrist demonstrating adequate Ulnar Artery collateral flow.    Sterile technique was used including antiseptics, cap, gloves, gown, hand hygiene, mask and sheet.  Skin prep: Chlorhexidine;  Time Out: Verified patient identification, verified procedure, site/side was marked, verified correct patient position, special equipment/implants available, medications/allergies/relevent history reviewed, required imaging and test results available.  Performed  The right wrist was anesthetized with 1% subcutaneous Lidocaine.  The right radial artery was accessed using the Seldinger Technique with placement of a 5 Fr Glide Sheath. The sheath was aspirated and flushed.  Then a total of 7 ml of standard Radial Artery Cocktail (see medications) was infused.    A 5 Fr TIG 4.0 Catheter was advanced of over a Lexicographer J wire into the ascending Aorta.  The catheter was used to engage the left and right coronary artery.  Multiple cineangiographic views of the left and right coronary artery system(s) were performed. This catheter was then exchanged over the Long Exchange Safety J wire for an angled Pigtail catheter that was advanced across the Aortic Valve.  LV hemodynamics were measured and Left Ventriculography was performed.  LV hemodynamics were then re-sampled, and the catheter was pulled back across the Aortic Valve for measurement of "pull-back" gradient.  The catheter and the wire was removed completely out of the body.  At this point, the case was discussed with Dr. Herbie Baltimore. I'm concerned about a long tubular 50-60% in-stent  restenosis of the mid LAD. We will proceed with fractional flow reserve measurement.  Please see his PCI note for further details.  The patient  was stable before, during and following the procedure.   Patient did tolerate procedure well. There were not complications. EBL: 10 cc  Medications:  Premedication: 5 mg  Valium  Sedation:  2 mg IV Versed, 50 IV mcg Fentanyl  Contrast:  50 Omnipaque   Hemodynamics:  Central Aortic Pressure / Mean Aortic Pressure: 95/37  LV Pressure / LV End diastolic Pressure:  10  Left Ventriculography:  EF:  45-50%  Wall Motion: Mild anterior hypokinesis  Coronary Angiographic Data:  Left Main:  Angiographically normal  Left Anterior Descending (LAD):  There appeared to be sequential proximal to mid LAD stents, with approximately 50-60% tubular mid LAD in-stent restenosis. There is a distal 60-70% stenosis of the LAD, however this is a very small vessel at the apex.  1st diagonal (D1):  There is a large first diagonal with approximately stent that is widely patent.  2nd diagonal (D2):  There is a small second diagonal branch which is not significant.  Circumflex (LCx):  This is a large vessel, with diffuse mild disease. There is a distal branching OM 2 and circumflex stenosis which is approximately 60%. Again this is distal and small at this location.  1st obtuse marginal:  Small vessel  2nd obtuse marginal:  Larger vessel with 50-60% ostial stenosis.  Right Coronary Artery: Ostial 50% stenosis, just prior to the previously placed stent which is widely patent. There is diffuse distal disease which is mild to moderate.  posterior descending artery: No significant disease  posterior lateral branch:  No significant disease  Impression: 1.  Possibly significant tubular 50-60% in-stent restenosis of the mid LAD. 2.  Mild anterior hypokinesis with an EF of 40-50%. 3.  LVEDP = 10 mmHg.  Plan: 1.  I discussed case with Dr. Herbie Baltimore, will proceed with a FFR evaluation of the LAD.  The case and results was discussed with the  patient. The case and results was not discussed with the patient's PCP. The case and results was discussed with the patient's Cardiologist.  Time Spend Directly with Patient:  45 minutes  Chrystie Nose, MD Attending Cardiologist [facility]  HILTY,Kenneth C 04/14/2011, 11:12 AM

## 2011-04-14 NOTE — Brief Op Note (Signed)
04/12/2011 - 04/14/2011  2:28 PM  PATIENT:  [name]  60 y.o. male  PRE-OPERATIVE DIAGNOSIS:  chest pain  POST-OPERATIVE DIAGNOSIS:  InStent re-stenosis of long LAD stented segment - positive FFR S/p Re-do stent of LAD with 2 overlapping Promus DES stents.  PROCEDURE: 78F to 6Fr Right Radial Sheath exchange. Fractional flow Reserve interrogation of mid LAR ISR Percutaneous Coronary Intervention on mid LAD ISR  SURGEON:  Surgeon(s): Lisette Abu. Hilty Marykay Lex  ANESTHESIA:   IV sedation  EBL:  Total I/O In: 23.1 [I.V.:23.1] Out: -   BLOOD ADMINISTERED:none  DRAINS: none   LOCAL MEDICATIONS USED:  LIDOCAINE 5 ml  SPECIMEN:  No Specimen  DISPOSITION OF SPECIMEN:  N/A  COUNTS:  YES  TOURNIQUET:  TR Band applied.  DICTATION: .Note written in EPIC  PLAN OF CARE: Continue with medical optimization.  Consider changing form Plavix to Brilinta / Effient given extensive amount of stent.  PATIENT DISPOSITION:  PACU, stable   Delay start of Pharmacological VTE agent (>24hrs) due to surgical blood loss or risk of bleeding:  Not applicable

## 2011-04-14 NOTE — Progress Notes (Signed)
ANTICOAGULATION CONSULT NOTE - Follow Up Consult  Pharmacy Consult for Heparin Indication: chest pain/ACS  Allergies  Allergen Reactions  . Atarax (Hydroxyzine Hcl)     Weakness, out of this world feeling.    Patient Measurements: Height: 5\' 8"  (172.7 cm) Weight: 206 lb 12.7 oz (93.8 kg) IBW/kg (Calculated) : 68.4  Heparin Dosing Weight: 88.4 kg  Vital Signs: Temp: 97.8 F (36.6 C) (12/03 0000) Temp src: Oral (12/03 0000) BP: 106/68 mmHg (12/03 0400)  Labs:  Basename 04/14/11 0720 04/13/11 0815 04/13/11 0500 04/12/11 2339 04/12/11 2156 04/12/11 1239  HGB 14.6 -- 15.8 -- -- --  HCT 43.2 -- 45.2 -- -- 45.7  PLT 165 -- 159 -- -- 194  APTT -- -- -- -- -- 31  LABPROT -- -- -- -- -- 13.2  INR -- -- -- -- -- 0.98  HEPARINUNFRC 0.35 -- -- 0.61 -- --  CREATININE 0.81 -- 0.82 -- -- 0.82  CKTOTAL -- 58 -- 76 73 --  CKMB -- 2.3 -- 2.7 2.5 --  TROPONINI -- <0.30 -- <0.30 <0.30 --   Estimated Creatinine Clearance: 107.8 ml/min (by C-G formula based on Cr of 0.81).  Goal of Therapy:  Heparin level 0.3-0.7 units/ml  Assessment: 60 yo male initiated on heparin for chest pain / ACS.  Heparin level is at-goal.  Plan for cardiac cath today.   Plan:  1.  Continue heparin at 1250 units/hr.   2.  Follow-up post-cath.   Lorre Munroe, PharmD  04/14/2011,8:54 AM

## 2011-04-15 LAB — CBC
Hemoglobin: 14.4 g/dL (ref 13.0–17.0)
Platelets: 160 10*3/uL (ref 150–400)
RBC: 4.62 MIL/uL (ref 4.22–5.81)
WBC: 7.3 10*3/uL (ref 4.0–10.5)

## 2011-04-15 MED ORDER — ISOSORBIDE MONONITRATE 15 MG HALF TABLET: 15.0000 mg | ORAL_TABLET | Freq: Every day | ORAL | Status: DC

## 2011-04-15 MED ORDER — METFORMIN HCL ER 500 MG PO TB24
500.0000 mg | ORAL_TABLET | Freq: Every day | ORAL | Status: DC
Start: 2011-04-15 — End: 2011-04-15
  Administered 2011-04-15: 500 mg via ORAL
  Filled 2011-04-15 (×2): qty 1

## 2011-04-15 MED ORDER — LIVING WELL WITH DIABETES BOOK
Freq: Once | Status: AC
  Administered 2011-04-15: 14:00:00
  Filled 2011-04-15 (×2): qty 1

## 2011-04-15 MED ORDER — NIACIN ER (ANTIHYPERLIPIDEMIC) 1000 MG PO TBCR: 2000.0000 mg | EXTENDED_RELEASE_TABLET | Freq: Every day | ORAL | Status: DC

## 2011-04-15 MED ORDER — METFORMIN HCL ER 500 MG PO TB24: 500.0000 mg | ORAL_TABLET | Freq: Every day | ORAL | Status: DC

## 2011-04-15 MED ORDER — PRASUGREL HCL 10 MG PO TABS: 10.0000 mg | ORAL_TABLET | Freq: Every day | ORAL | Status: DC

## 2011-04-15 MED ORDER — ISOSORBIDE MONONITRATE 15 MG HALF TABLET
15.0000 mg | ORAL_TABLET | Freq: Every day | ORAL | Status: DC
  Administered 2011-04-15: 15 mg via ORAL
  Filled 2011-04-15 (×2): qty 1

## 2011-04-15 MED FILL — Dextrose Inj 5%: INTRAVENOUS | Qty: 50 | Status: AC

## 2011-04-15 NOTE — Progress Notes (Signed)
Pt discharged home with wife.  RN reviewed discharge instructions and medications with pt and he verbalized understanding.

## 2011-04-15 NOTE — Progress Notes (Addendum)
Pt. Seen and examined. Agree with the NP/PA-C note as written.  Seen and examined. Now on effient. New onset diabetes - HBA1C is 6.9.  Will start low dose metformin XR. Low dose imdur. Will review meds. Probably home today. Follow-up with [place] and his PCP.  Chrystie Nose, MD Attending Cardiologist [facility]

## 2011-04-15 NOTE — Discharge Summary (Signed)
Physician Discharge Summary  Patient ID: [name] MRN: [id number] DOB/AGE: [birth year] 60 y.o.  Admit date: 04/12/2011 Discharge date: 04/15/2011  Admission Diagnoses:  Discharge Diagnoses:  Principal Problem:  *Angina at rest Active Problems:  CAD (coronary artery disease)  HTN (hypertension)  Hyperlipemia   Discharged Condition: stable  Hospital Course:  60 year old white married male presented to [facility] emergency room with chest pain. He has a cardiac history dating back to 1995 when he had stents placed x2 and then again in 2000.Those were not done in [place].  In 2009 he presented with an ST elevation MI undergoing PTCA and stent of the proximal LAD, PTCA of the in-stent restenotic mid LAD lesion and rescue of the stent jailed diagonal one branch of the LAD.  Residual coronary disease included mild luminal irregularities of the RCA left circumflex with large obtuse marginal 1 with 50% stenosis, 40 and mid 50% stenosis of the obtuse marginal at the bifurcation and distal circumflex revealed bifurcating 80-90% stenosis that these branches were small and probably not amenable for balloon angioplasty. Also a small AV groove branch the circumflex shows a focal 90% ostial stenosis. There were faint collaterals from the RCA to the to the distal circumflex.his ejection fraction at that time was 40-50%.  He has had one other admission for chest pain in 2011 was to have a followup stress test but he had to cancel and had never rescheduled. Over the 2 weeks prior to admission he has had increased stress and his family life he and his wife are separating and he's had chest pain for approximately 2 weeks.  He presented with increased chest pain described as a bowling balls size pain just left of center without radiation to his neck shoulders or back no shortness of breath and mild diaphoresis and mild nausea.  In the emergency room he was given 4 baby aspirin and nitroglycerin sublingual  with relief of this chest pain.  The patient was started on IV heparin and IV nitroglycerin cardiac enzymes were cycled. This was sent for left heart catheterization on 04/14/2011. EKG is sinus rhythm without acute changes.  Cardiac enzymes were negative.TSH 1.342. Magnesium 2.2.WBC 7.3, hemoglobin 14.4, hematocrit 41.5, platelets 160,sodium 140, potassium 3.9, chloride 107, carbon dioxide 25, glucose 137, BUN 14, serum creatinine 0.81, calcium 9.0, total cholesterol 125, triglycerides 66, HDL 35, total cholesterol HDL ratio 3.6, VLDL 13, LDL 77, hemoglobin A1c 6.9.  Patient received 2 drug-eluting stents to the LAD.     Significant Diagnostic Studies:  Left Heart Cath, 04/14/11:  Lesion #1: Mid LAD long tubular ISR ~60-70%, FFR 0.75 (appears to be old BMS stent)  Pre-PCI Stenosis: 60-70 % Post-PCI Stenosis: 0 %  TIMI 3 flow TIMI 3 flow  Guide Catheter: EBU 3.5 Guidewire: FFR wire in place after completing FFR  Pre-Dilitation Balloon: 3.75 mm x Mm Flextome Cutting balloon  1st Inflation: 6 Atm for 60 Sec -- distal stented segment.  2nd Inflation: 12 Atm for 60 Sec -- mid stented segment  3rd Inflation: 12 Atm for 30 Sec -- proximal stented segment at the site of most significant narrowing.  Scout angiography did reveal evidence of dissection with contrast staining behind the previously placed stent. No perforation. TIMI 3 flow.  I then planned to "tack" down the dissection with noncompliant balloon.  Pre-Dilitation Balloon: 3.75 mm x Mm [place] Trek balloon  1st Inflation: 8 Atm for 120 Sec - distal stented  2nd Inflation: 12 Atm for 120 Sec -- mid stented  segment  3rd Inflation: 12 Atm for 110 Sec -- proximal stented segment at the site of most significant narrowing.  Scout angiography revealed persistence of dissection with contrast staining behind the previously placed stent. No perforation. TIMI 3 flow. I therefore decided to proceed with stenting the distal segment as it appeared to be  retrograde staining posterior to the previously placed bare-metal helical coil stent.  Stent #1 : Promus DES 3.5 mm x 12 mm - at distal edge of ballooned segment / distal edge of apparent dissection.  Deployment: 11 Atm for 45 Sec -- filed at 3.6 mm;  2nd Inflation: 12 Atm for 45 Sec -- overlapping proximal edge of new stent into prior stent  Scout angiography did reveal evidence of dissection with contrast staining behind the previously placed stent. No perforation. TIMI 3 flow. There was no antegrade staining, or retrograde staining more proximal than the helical coil stent.  Stent #2 : Promus DES 3.5 mm x 24 mm - at distal edge of ballooned segment / distal edge of apparent dissection.  Deployment: 14 Atm for 50 Sec  2nd Inflation: 60 Atm for 45 Sec  Scout angiography now did not reveal evidence of dissection or perforation  Post deployment angiography with and without guidewire in place did not reveal evidence of dissection or perforation.  The patient did have post deployment chest discomfort for 4/10. Intracoronary nitroglycerin injection resulted in some relief of pain. There is no evidence of dissection or perforation, and by the time of his transferred to the PACU he was pain-free.  The patient was transported to the PACU --> TCU in stable condition. He did  The patient was stable before, during and following the procedure.  Patient did tolerate procedure well.  There were complications. Unexpected possible dissection behind previously placed stent - consider stent strut fracture during balloon inflation.  EBL: <60ml  Medications:  Additional Sedation: 1 mg IV Versed,  Total combined contrast: 275 milliliters Omnipaque  Radial cocktail: 5 mL (Radial Cocktail: 5 mg Verapamil, 400 mcg NTG, 2 ml 2% Lidocaine -- in 10ml NS)  Angiomax bolus and drip - 5 mg per 1 mL 0.9 normal saline,  bolus: 0.75 mg / kilogram; infusion rate: 1.75 mg/kilogram/hour  Impression:  1. Hemodynamically  significant tubular LAD In-stent stenosis by FFR - 0.75  2. Successful PCI with 2 Overlapping Promus DES 3.5 mm Stents - overlapping prior stents (15mm & 24 mm)   Discharge Exam: Blood pressure 102/60, pulse 85, temperature 98 F (36.7 C), temperature source Oral, resp. rate 16, height 5\' 8"  (1.727 m), weight 95.3 kg (210 lb 1.6 oz), SpO2 97.00%.   Disposition:  Patient will be discharged home in stable condition. It is recommended he follows up with his primary care provider for diabetes management.  We have added metformin 500 mg daily which she will begin on Thursday of this week. The patient indicates your he has a glucometer at home as well as his test strips.patient will follow Dr. Gery Pray approximately 2 weeks in office will call him up appointment time.   Discharge Orders    Future Orders Please Complete By Expires   Diet - low sodium heart healthy      Increase activity slowly      Discharge instructions      Comments:   Our office will call you with the appointment time.   Call MD for:  redness, tenderness, or signs of infection (pain, swelling, redness, odor or green/yellow discharge around incision site)  Scheduling Instructions:   At Cath site.     Current Discharge Medication List    START taking these medications   Details  isosorbide mononitrate (IMDUR) 15 mg TB24 Take 0.5 tablets (15 mg total) by mouth daily. Qty: 30 tablet, Refills: 3    metFORMIN (GLUCOPHAGE-XR) 500 MG 24 hr tablet Take 1 tablet (500 mg total) by mouth daily. Qty: 30 tablet, Refills: 3    prasugrel (EFFIENT) 10 MG TABS Take 1 tablet (10 mg total) by mouth daily. Qty: 30 tablet, Refills: 10      CONTINUE these medications which have CHANGED   Details  niacin (NIASPAN) 1000 MG CR tablet Take 2 tablets (2,000 mg total) by mouth at bedtime. Qty: 60 tablet, Refills: 3      CONTINUE these medications which have NOT CHANGED   Details  alfuzosin (UROXATRAL) 10 MG 24 hr tablet Take 10 mg by  mouth daily.      amLODipine (NORVASC) 5 MG tablet Take 5 mg by mouth daily.      aspirin 325 MG tablet Take 325 mg by mouth daily.      atorvastatin (LIPITOR) 80 MG tablet Take 80 mg by mouth daily.      carvedilol (COREG) 6.25 MG tablet Take 6.25 mg by mouth 2 (two) times daily with a meal.      Multiple Vitamins-Minerals (MULTIVITAMINS THER. W/MINERALS) TABS Take 1 tablet by mouth daily.      nitroGLYCERIN (NITROLINGUAL) 0.4 MG/SPRAY spray Place 1 spray under the tongue every 5 (five) minutes as needed. For chest pain.     ramipril (ALTACE) 2.5 MG capsule Take 2.5 mg by mouth daily.        STOP taking these medications     clopidogrel (PLAVIX) 75 MG tablet        Follow-up Information    Follow up with Provider Not In System .         SignedDwana Melena 04/15/2011, 3:01 PM

## 2011-04-15 NOTE — Progress Notes (Signed)
Patient ID: [name], male   DOB: 02/15/1951, 60 y.o.   MRN: [id number]   [facility]  Subjective: No CP or SOB  Objective: Vital signs in last 24 hours: Temp:  [98.3 F (36.8 C)-98.6 F (37 C)] 98.6 F (37 C) (12/04 0400) Pulse Rate:  [73-92] 91  (12/04 0500) Resp:  [16-20] 16  (12/04 0400) BP: (96-119)/(55-70) 119/69 mmHg (12/04 0400) SpO2:  [95 %-99 %] 96 % (12/04 0500) FiO2 (%):  [1 %] 1 % (12/04 0400) Weight:  [95.3 kg (210 lb 1.6 oz)] 210 lb 1.6 oz (95.3 kg) (12/04 0500)    Intake/Output from previous day: 12/03 0701 - 12/04 0700 In: 523 [I.V.:523] Out: 400 [Urine:400] Intake/Output this shift: Total I/O In: 6.3 [I.V.:6.3] Out: -   Medications Current Facility-Administered Medications  Medication Dose Route Frequency Provider Last Rate Last Dose  . 0.9 %  sodium chloride infusion  1 mL/kg/hr Intravenous Continuous Kenneth C. Hilty 94 mL/hr at 04/14/11 1330 1.002 mL/kg/hr at 04/14/11 1330  . acetaminophen (TYLENOL) tablet 650 mg  650 mg Oral Q4H PRN Leone Brand, NP      . alfuzosin (UROXATRAL) 24 hr tablet 10 mg  10 mg Oral Daily Leone Brand, NP   10 mg at 04/14/11 2130  . ALPRAZolam Prudy Feeler) tablet 0.25 mg  0.25 mg Oral BID PRN Leone Brand, NP      . amLODipine (NORVASC) tablet 5 mg  5 mg Oral Daily Leone Brand, NP   5 mg at 04/14/11 8657  . aspirin tablet 325 mg  325 mg Oral Daily Leone Brand, NP   325 mg at 04/14/11 8469  . bivalirudin (ANGIOMAX) 250 MG injection           . carvedilol (COREG) tablet 6.25 mg  6.25 mg Oral BID WC Leone Brand, NP   6.25 mg at 04/14/11 1700  . chlorhexidine (HIBICLENS) 4 % liquid           . diazepam (VALIUM) tablet 5 mg  5 mg Oral On Call Leone Brand, NP   5 mg at 04/14/11 0943  . fentaNYL (SUBLIMAZE) 0.05 MG/ML injection           . heparin 1000 UNIT/ML injection           . heparin 2-0.9 UNIT/ML-% infusion           . lidocaine (XYLOCAINE) 1 % injection           . midazolam  (VERSED) 2 MG/2ML injection           . midazolam (VERSED) 2 MG/2ML injection           . morphine 2 MG/ML injection 2 mg  2 mg Intravenous Q1H PRN Lisette Abu Hilty   2 mg at 04/14/11 1329  . multivitamins ther. w/minerals tablet 1 tablet  1 tablet Oral Daily Leone Brand, NP   1 tablet at 04/14/11 1000  . niacin (NIASPAN) CR tablet 2,000 mg  2,000 mg Oral QHS Leone Brand, NP   2,000 mg at 04/14/11 2239  . nitroGLYCERIN (NITROSTAT) SL tablet 0.4 mg  0.4 mg Sublingual Q5 min PRN Gwyneth Sprout, MD   0.4 mg at 04/12/11 1241  . nitroGLYCERIN (NTG ON-CALL) 0.2 mg/mL injection           . nitroGLYCERIN 0.2 mg/mL in dextrose 5 % infusion  2-200 mcg/min Intravenous Continuous Lisette Abu. Hilty 1.5 mL/hr at 04/15/11  0500 5 mcg/min at 04/15/11 0500  . ondansetron (ZOFRAN) injection 4 mg  4 mg Intravenous Q6H PRN Chrystie Nose      . prasugrel (EFFIENT) tablet 10 mg  10 mg Oral Daily Lisette Abu Hilty      . ramipril (ALTACE) capsule 2.5 mg  2.5 mg Oral Daily Leone Brand, NP   2.5 mg at 04/14/11 0951  . rosuvastatin (CRESTOR) tablet 20 mg  20 mg Oral Daily Leone Brand, NP   20 mg at 04/14/11 0951  . verapamil (ISOPTIN) 2.5 MG/ML injection           . zolpidem (AMBIEN) tablet 5 mg  5 mg Oral QHS PRN Leone Brand, NP      . DISCONTD: 0.9 %  sodium chloride infusion   Intravenous Continuous Leone Brand, NP 20 mL/hr at 04/14/11 0600    . DISCONTD: adenosine (ADENOCARD) 12 MG/4ML injection 48 mg  16 mL Intravenous Once Marykay Lex      . DISCONTD: clopidogrel (PLAVIX) tablet 75 mg  75 mg Oral Daily Leone Brand, NP   75 mg at 04/14/11 0951  . DISCONTD: heparin ADULT infusion 100 units/ml (25000 units/250 ml)  1,250 Units/hr Intravenous Continuous Rolley Sims, PHARMD 12.5 mL/hr at 04/14/11 0700 1,250 Units at 04/14/11 0700  . DISCONTD: nitroGLYCERIN 0.2 mg/mL in dextrose 5 % infusion  2-200 mcg/min Intravenous Titrated Leone Brand, NP   2 mcg/min at 04/13/11 0700  . DISCONTD:  ondansetron (ZOFRAN) injection 4 mg  4 mg Intravenous Q6H PRN Leone Brand, NP      . DISCONTD: prasugrel (EFFIENT) tablet 10 mg  10 mg Oral Daily Chrystie Nose      . DISCONTD: prasugrel (EFFIENT) tablet 10 mg  10 mg Oral Daily Chrystie Nose        PE: General appearance: alert, cooperative and no distress Lungs: clear to auscultation bilaterally Heart: regular rate and rhythm, S1, S2 normal, no murmur, click, rub or gallop Extremities: No LEE Pulses: 2+ and symmetric Right Wrist:  No errythema, ecchymosis.  + pulse. Lab Results:   Basename 04/14/11 0720 04/13/11 0500 04/12/11 1239  WBC 5.8 13.2* 10.2  HGB 14.6 15.8 16.2  HCT 43.2 45.2 45.7  PLT 165 159 194    CBC    Component Value Date/Time   WBC 7.3 04/15/2011 0540   RBC 4.62 04/15/2011 0540   HGB 14.4 04/15/2011 0540   HCT 41.5 04/15/2011 0540   PLT 160 04/15/2011 0540   MCV 89.8 04/15/2011 0540   MCH 31.2 04/15/2011 0540   MCHC 34.7 04/15/2011 0540   RDW 12.9 04/15/2011 0540   LYMPHSABS 3.0 04/12/2011 1239   MONOABS 0.6 04/12/2011 1239   EOSABS 0.3 04/12/2011 1239   BASOSABS 0.1 04/12/2011 1239     BMET  Basename 04/14/11 0720 04/13/11 0500 04/12/11 1239  NA 140 135 141  K 3.9 4.1 3.7  CL 107 104 103  CO2 25 25 28   GLUCOSE 137* 145* 104*  BUN 14 16 14   CREATININE 0.81 0.82 0.82  CALCIUM 9.0 9.2 9.6   PT/INR  Basename 04/12/11 1239  LABPROT 13.2  INR 0.98   Cholesterol  Basename 04/13/11 0500  CHOL 125125   Cardiac Enzymes   Studies/Results: Left Heart Cath, 04/14/11  Lesion #1: Mid LAD long tubular ISR ~60-70%, FFR 0.75 (appears to be old BMS stent)  Pre-PCI Stenosis: 60-70 % Post-PCI Stenosis: 0 %  TIMI 3 flow  TIMI 3 flow  Guide Catheter: EBU 3.5 Guidewire: FFR wire in place after completing FFR  Pre-Dilitation Balloon: 3.75 mm x Mm [place] Cutting balloon  1st Inflation: 6 Atm for 60 Sec -- distal stented segment.  2nd Inflation: 12 Atm for 60 Sec -- mid stented segment  3rd Inflation:  12 Atm for 30 Sec -- proximal stented segment at the site of most significant narrowing.  Scout angiography did reveal evidence of dissection with contrast staining behind the previously placed stent. No perforation. TIMI 3 flow.  I then planned to "tack" down the dissection with noncompliant balloon.  Pre-Dilitation Balloon: 3.75 mm x Mm [place] Trek balloon  1st Inflation: 8 Atm for 120 Sec - distal stented  2nd Inflation: 12 Atm for 120 Sec -- mid stented segment  3rd Inflation: 12 Atm for 110 Sec -- proximal stented segment at the site of most significant narrowing.  Scout angiography revealed persistence of dissection with contrast staining behind the previously placed stent. No perforation. TIMI 3 flow. I therefore decided to proceed with stenting the distal segment as it appeared to be retrograde staining posterior to the previously placed bare-metal helical coil stent.  Stent #1 : Promus DES 3.5 mm x 12 mm - at distal edge of ballooned segment / distal edge of apparent dissection.  Deployment: 11 Atm for 45 Sec -- filed at 3.6 mm;  2nd Inflation: 12 Atm for 45 Sec -- overlapping proximal edge of new stent into prior stent  Scout angiography did reveal evidence of dissection with contrast staining behind the previously placed stent. No perforation. TIMI 3 flow. There was no antegrade staining, or retrograde staining more proximal than the helical coil stent.  Stent #2 : Promus DES 3.5 mm x 24 mm - at distal edge of ballooned segment / distal edge of apparent dissection.  Deployment: 14 Atm for 50 Sec  2nd Inflation: 60 Atm for 45 Sec  Scout angiography now did not reveal evidence of dissection or perforation  Post deployment angiography with and without guidewire in place did not reveal evidence of dissection or perforation.  The patient did have post deployment chest discomfort for 4/10. Intracoronary nitroglycerin injection resulted in some relief of pain. There is no evidence of dissection or  perforation, and by the time of his transferred to the PACU he was pain-free.  The patient was transported to the PACU --> TCU in stable condition. He did  The patient was stable before, during and following the procedure.  Patient did tolerate procedure well.  There were complications. Unexpected possible dissection behind previously placed stent - consider stent strut fracture during balloon inflation.  EBL: <63ml  Medications:  Additional Sedation: 1 mg IV Versed,  Total combined contrast: 275 milliliters Omnipaque  Radial cocktail: 5 mL (Radial Cocktail: 5 mg Verapamil, 400 mcg NTG, 2 ml 2% Lidocaine -- in 10ml NS)  Angiomax bolus and drip - 5 mg per 1 mL 0.9 normal saline,  bolus: 0.75 mg / kilogram; infusion rate: 1.75 mg/kilogram/hour  Impression:  1. Hemodynamically significant tubular LAD In-stent stenosis by FFR - 0.75  2. Successful PCI with 2 Overlapping Promus DES 3.5 mm Stents - overlapping prior stents (15mm & 24 mm)   Assessment/Plan  Principal Problem:  *Angina at rest Active Problems:  CAD (coronary artery disease)   LAD: Successful PCI with 2 Overlapping Promus DES 3.5 mm Stents - overlapping prior  stents  (15mm & 24 mm)    HTN (hypertension)  Hyperlipemia  Plan:  Pt doing well.  Radial cath. Ambulating in hall without CP.  ASA, Coreg, Norvasc, Effient,Altace, crestor.  Home today.   LOS: 3 days    Lira Stephen W 04/15/2011 6:01 AM

## 2011-04-15 NOTE — Progress Notes (Signed)
Inpatient Diabetes Program Recommendations  AACE/ADA: New Consensus Statement on Inpatient Glycemic Control (2009)  Target Ranges:  Prepandial:   less than 140 mg/dL      Peak postprandial:   less than 180 mg/dL (1-2 hours)      Critically ill patients:  140 - 180 mg/dL   Reason for Visit: New-Onset DM Please order CBGs during hospitalization  Bedside RN will begin basic Diabetes education using DM videos, [facility] Nursing Consult notes and 'Living Well with Diabetes' patient education manual.  Will continue to follow.

## 2011-04-15 NOTE — Progress Notes (Signed)
Cardiac Rehab 0930-1030 Pt had already walked several rounds of unit without CP or dizziness.  Education completed. Permission given to refer to [facility] Phase 2. Emotional support given. Pt discussed family issues and wanting to make marriage work.Isaac Heinle DunlapRN

## 2011-04-15 NOTE — Discharge Summary (Signed)
Pt. Seen and examined. Agree with the NP/PA-C note as written.  Due to the number of overlapping stents in the LAD, he will require lifelong dual anti-platelet therapy.  Chrystie Nose, MD Attending Cardiologist [facility]

## 2011-04-20 ENCOUNTER — Encounter (HOSPITAL_COMMUNITY): Payer: Self-pay | Admitting: *Deleted

## 2012-06-23 ENCOUNTER — Encounter: Payer: Self-pay | Admitting: Cardiology

## 2012-06-23 ENCOUNTER — Ambulatory Visit (INDEPENDENT_AMBULATORY_CARE_PROVIDER_SITE_OTHER): Admitting: Cardiology

## 2012-06-23 ENCOUNTER — Telehealth: Payer: Self-pay | Admitting: Cardiology

## 2012-06-23 ENCOUNTER — Ambulatory Visit (HOSPITAL_COMMUNITY)
Admission: RE | Admit: 2012-06-23 | Discharge: 2012-06-23 | Disposition: A | Discharge status: Home / Self Care | Source: Ambulatory Visit | Attending: Cardiology | Admitting: Cardiology

## 2012-06-23 ENCOUNTER — Other Ambulatory Visit: Payer: Self-pay | Admitting: Cardiology

## 2012-06-23 VITALS — BP 122/68 | HR 77 | Ht 68.0 in | Wt 203.0 lb

## 2012-06-23 DIAGNOSIS — I25810 Atherosclerosis of coronary artery bypass graft(s) without angina pectoris: Secondary | ICD-10-CM

## 2012-06-23 DIAGNOSIS — I2510 Atherosclerotic heart disease of native coronary artery without angina pectoris: Secondary | ICD-10-CM

## 2012-06-23 DIAGNOSIS — I208 Other forms of angina pectoris: Secondary | ICD-10-CM

## 2012-06-23 DIAGNOSIS — I10 Essential (primary) hypertension: Secondary | ICD-10-CM

## 2012-06-23 DIAGNOSIS — I2089 Other forms of angina pectoris: Secondary | ICD-10-CM

## 2012-06-23 DIAGNOSIS — E785 Hyperlipidemia, unspecified: Secondary | ICD-10-CM

## 2012-06-23 DIAGNOSIS — I209 Angina pectoris, unspecified: Secondary | ICD-10-CM

## 2012-06-23 DIAGNOSIS — Z7901 Long term (current) use of anticoagulants: Secondary | ICD-10-CM

## 2012-06-23 DIAGNOSIS — R079 Chest pain, unspecified: Secondary | ICD-10-CM | POA: Insufficient documentation

## 2012-06-23 LAB — BASIC METABOLIC PANEL
GFR: 81.52 mL/min (ref >60.00)
Potassium: 3.9 mEq/L (ref 3.5–5.1)
Sodium: 136 mEq/L (ref 135–145)

## 2012-06-23 LAB — PROTIME-INR: Prothrombin Time: 11.6 s (ref 10.2–12.4)

## 2012-06-23 MED ORDER — RANOLAZINE ER 1000 MG PO TB12: 1000.0000 mg | ORAL_TABLET | Freq: Two times a day (BID) | ORAL | Status: DC

## 2012-06-23 NOTE — Patient Instructions (Addendum)
**Note De-Identified Andretta Ergle Obfuscation** Your physician recommends that you return for lab work in: today  Your physician has requested that you have a cardiac catheterization. Cardiac catheterization is used to diagnose and/or treat various heart conditions. Doctors may recommend this procedure for a number of different reasons. The most common reason is to evaluate chest pain. Chest pain can be a symptom of coronary artery disease (CAD), and cardiac catheterization can show whether plaque is narrowing or blocking your heart's arteries. This procedure is also used to evaluate the valves, as well as measure the blood flow and oxygen levels in different parts of your heart. For further information please visit [email or web address] Please follow instruction sheet, as given.  A chest x-ray takes a picture of the organs and structures inside the chest, including the heart, lungs, and blood vessels. This test can show several things, including, whether the heart is enlarges; whether fluid is building up in the lungs; and whether pacemaker / defibrillator leads are still in place.   Your physician recommends that you schedule a follow-up appointment in: after cath

## 2012-06-23 NOTE — Telephone Encounter (Signed)
ROI faxed to [facility], Records were Received From [facility] Gave to [place] P  06/23/12/KM
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
## 2012-06-24 ENCOUNTER — Encounter (HOSPITAL_COMMUNITY): Payer: Self-pay | Admitting: Pharmacist

## 2012-06-25 ENCOUNTER — Encounter (HOSPITAL_COMMUNITY): Payer: Self-pay | Admitting: Anesthesiology

## 2012-06-25 ENCOUNTER — Encounter (HOSPITAL_COMMUNITY)
Admission: RE | Disposition: A | Discharge status: Home / Self Care | Payer: Self-pay | Source: Ambulatory Visit | Attending: Cardiology

## 2012-06-25 ENCOUNTER — Other Ambulatory Visit: Payer: Self-pay | Admitting: Cardiology

## 2012-06-25 ENCOUNTER — Ambulatory Visit (HOSPITAL_COMMUNITY)
Admission: RE | Admit: 2012-06-25 | Discharge: 2012-06-25 | Disposition: A | Discharge status: Home / Self Care | Source: Ambulatory Visit | Attending: Cardiology | Admitting: Cardiology

## 2012-06-25 DIAGNOSIS — R079 Chest pain, unspecified: Secondary | ICD-10-CM | POA: Insufficient documentation

## 2012-06-25 DIAGNOSIS — Z9861 Coronary angioplasty status: Secondary | ICD-10-CM | POA: Insufficient documentation

## 2012-06-25 DIAGNOSIS — I2510 Atherosclerotic heart disease of native coronary artery without angina pectoris: Secondary | ICD-10-CM

## 2012-06-25 DIAGNOSIS — E785 Hyperlipidemia, unspecified: Secondary | ICD-10-CM | POA: Insufficient documentation

## 2012-06-25 DIAGNOSIS — I209 Angina pectoris, unspecified: Secondary | ICD-10-CM

## 2012-06-25 DIAGNOSIS — I10 Essential (primary) hypertension: Secondary | ICD-10-CM | POA: Insufficient documentation

## 2012-06-25 HISTORY — PX: LEFT HEART CATHETERIZATION WITH CORONARY ANGIOGRAM: SHX5451

## 2012-06-25 LAB — CBC
HCT: 42.8 % (ref 39.0–52.0)
MCH: 31.8 pg (ref 26.0–34.0)
MCV: 91.3 fL (ref 78.0–100.0)
Platelets: 170 10*3/uL (ref 150–400)
RBC: 4.69 MIL/uL (ref 4.22–5.81)

## 2012-06-25 SURGERY — LEFT HEART CATHETERIZATION WITH CORONARY ANGIOGRAM
Anesthesia: LOCAL

## 2012-06-25 MED ORDER — MIDAZOLAM HCL 2 MG/2ML IJ SOLN
INTRAMUSCULAR | Status: AC
  Filled 2012-06-25: qty 2

## 2012-06-25 MED ORDER — FENTANYL CITRATE 0.05 MG/ML IJ SOLN
INTRAMUSCULAR | Status: AC
  Filled 2012-06-25: qty 2

## 2012-06-25 MED ORDER — DIAZEPAM 5 MG PO TABS
5.0000 mg | ORAL_TABLET | ORAL | Status: AC
  Administered 2012-06-25: 5 mg via ORAL

## 2012-06-25 MED ORDER — SODIUM CHLORIDE 0.9 % IJ SOLN: 3.0000 mL | Freq: Two times a day (BID) | INTRAMUSCULAR | Status: DC

## 2012-06-25 MED ORDER — SODIUM CHLORIDE 0.9 % IV SOLN: 1.0000 mL/kg/h | INTRAVENOUS | Status: DC

## 2012-06-25 MED ORDER — PANTOPRAZOLE SODIUM 40 MG PO TBEC: 40.0000 mg | DELAYED_RELEASE_TABLET | Freq: Every day | ORAL | Status: DC

## 2012-06-25 MED ORDER — SODIUM CHLORIDE 0.9 % IV SOLN
INTRAVENOUS | Status: DC
  Administered 2012-06-25: 06:00:00 via INTRAVENOUS

## 2012-06-25 MED ORDER — ACETAMINOPHEN 325 MG PO TABS: 650.0000 mg | ORAL_TABLET | ORAL | Status: DC | PRN

## 2012-06-25 MED ORDER — ASPIRIN 81 MG PO CHEW
CHEWABLE_TABLET | ORAL | Status: AC
  Filled 2012-06-25: qty 4

## 2012-06-25 MED ORDER — ONDANSETRON HCL 4 MG/2ML IJ SOLN: 4.0000 mg | Freq: Four times a day (QID) | INTRAMUSCULAR | Status: DC | PRN

## 2012-06-25 MED ORDER — PRASUGREL HCL 10 MG PO TABS
10.0000 mg | ORAL_TABLET | ORAL | Status: DC
  Filled 2012-06-25: qty 1

## 2012-06-25 MED ORDER — LIDOCAINE HCL (PF) 1 % IJ SOLN
INTRAMUSCULAR | Status: AC
  Filled 2012-06-25: qty 30

## 2012-06-25 MED ORDER — DIAZEPAM 5 MG PO TABS
ORAL_TABLET | ORAL | Status: AC
  Filled 2012-06-25: qty 1

## 2012-06-25 MED ORDER — SODIUM CHLORIDE 0.9 % IV SOLN: 250.0000 mL | INTRAVENOUS | Status: DC | PRN

## 2012-06-25 MED ORDER — SODIUM CHLORIDE 0.9 % IJ SOLN: 3.0000 mL | INTRAMUSCULAR | Status: DC | PRN

## 2012-06-25 MED ORDER — HEPARIN (PORCINE) IN NACL 2-0.9 UNIT/ML-% IJ SOLN
INTRAMUSCULAR | Status: AC
  Filled 2012-06-25: qty 2000

## 2012-06-25 MED ORDER — ASPIRIN 81 MG PO CHEW
324.0000 mg | CHEWABLE_TABLET | ORAL | Status: AC
  Administered 2012-06-25: 324 mg via ORAL
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
## 2012-06-25 NOTE — CV Procedure (Addendum)
   Cardiac Catheterization Procedure Note  Name: [name] MRN: [id number] DOB: [birth year]  Procedure: Left Heart Cath, Selective Coronary Angiography, LV angiography  Indication: 62 year old white male with history of coronary disease status post myocardial infarction x2. He has had extensive stenting of the proximal to mid LAD and the first diagonal branch. He presents with progressive symptoms of chest pain.   Procedural Details: The right wrist was prepped, draped, and anesthetized with 1% lidocaine. Using the modified Seldinger technique, a 5 French sheath was introduced into the right radial artery. 3 mg of verapamil was administered through the sheath, weight-based unfractionated heparin was administered intravenously. Standard Judkins catheters were used for selective coronary angiography and left ventriculography. Catheter exchanges were performed over an exchange length guidewire. There were no immediate procedural complications. A TR band was used for radial hemostasis at the completion of the procedure.  The patient was transferred to the post catheterization recovery area for further monitoring.  Procedural Findings: Hemodynamics: AO 103/62 with a mean of 81 mmHg LV 105/14 mmHg  Coronary angiography: Coronary dominance: right  Left mainstem: There is 20% stenosis in the distal left main.  Left anterior descending (LAD): The LAD is extensively stented from the proximal to mid vessel. Stents are widely patent. In the mid LAD there is 20-30% disease within the stent. The distal LAD demonstrates a segmental 70-80% stenosis. The first diagonal branch is also stented proximally. It is widely patent with less than 20% disease in the diagonal.  Left circumflex (LCx): The left circumflex has diffuse 40% disease in the proximal to mid vessel. The first obtuse marginal vessel is without significant disease. Left circumflex then terminates in a larger second marginal branch which is  diffusely diseased up to 40% proximally.  Right coronary artery (RCA): The right coronary is a large dominant vessel. It has diffuse plaque throughout. In the proximal vessel there is 30-40% stenosis. There is diffuse 30% disease distally.  Left ventriculography: Left ventricular systolic function is abnormal. There is severe hypokinesis of the mid to distal anterior wall and apex. Overall ejection fraction is estimated at 40%. There is no significant mitral insufficiency.  Final Conclusions:   1. Single vessel obstructive coronary disease involving the distal LAD. This is unchanged from prior study in December 2012.The extensive stents in the proximal to mid LAD and first diagonal branch are still patent. 2. Moderate left ventricular dysfunction.  Recommendations: Continue medical management. Consider trial of PPI. If no improvement may need formal GI evaluation.  Theron Arista [facility] 06/25/2012, 8:17 AM

## 2012-06-25 NOTE — Interval H&P Note (Signed)
History and Physical Interval Note:  06/25/2012 7:37 AM  [name]  has presented today for surgery, with the diagnosis of Chest pain  The various methods of treatment have been discussed with the patient and family. After consideration of risks, benefits and other options for treatment, the patient has consented to  Procedure(s): LEFT HEART CATHETERIZATION WITH CORONARY ANGIOGRAM (N/A) as a surgical intervention .  The patient's history has been reviewed, patient examined, no change in status, stable for surgery.  I have reviewed the patient's chart and labs.  Questions were answered to the patient's satisfaction.     Theron Arista [facility] 06/25/2012 7:37 AM

## 2012-07-02 ENCOUNTER — Encounter: Payer: Self-pay | Admitting: Cardiology

## 2012-07-06 ENCOUNTER — Encounter: Payer: Self-pay | Admitting: Nurse Practitioner

## 2012-07-06 ENCOUNTER — Encounter: Payer: Self-pay | Admitting: Internal Medicine

## 2012-07-06 ENCOUNTER — Ambulatory Visit (INDEPENDENT_AMBULATORY_CARE_PROVIDER_SITE_OTHER): Admitting: Nurse Practitioner

## 2012-07-06 VITALS — BP 104/60 | HR 104 | Resp 16 | Ht 68.0 in | Wt 206.4 lb

## 2012-07-06 DIAGNOSIS — R079 Chest pain, unspecified: Secondary | ICD-10-CM

## 2012-07-06 MED ORDER — CARVEDILOL 6.25 MG PO TABS: 9.7500 mg | ORAL_TABLET | Freq: Two times a day (BID) | ORAL | Status: DC

## 2012-07-06 NOTE — Progress Notes (Signed)
[name] Date of Birth: 09-Jun-1950 Medical Record #[id number]  History of Present Illness: [name] is seen back today for a post cath visit. He is seen for Dr. Swaziland. He has an extensive cardiac history as noted by Dr. Elvis Coil note from earlier this month. His other issues include HTN and HLD. He has OSA and uses CPAP.   He was most recently seen with recurrent chest pain. Cardiac cath was recommended. Results are as below. He has single vessel obstructive CAD involving the distal LAD and was unchanged from his prior study in 2012. Extensive stents in the proximal to mid LAD and 1st DX were patent. He has moderate LV dysfunction. EF is 40%. He will be managed medically. Trial of PPI should be considered.   He comes back today. He is here alone. He is doing ok. He was started on Protonix. Sees no difference in his symptoms. Still with some xiphoid tightness. Thinks it might be his gallbladder. Never seen by GI in the past. Never had colonoscopy in the past as well. Not dizzy or lightheaded. He is planning on returning to the gym to exercise and seems motivated to work on his diet and weight. No problems with his cath site reported.   Current Outpatient Prescriptions on File Prior to Visit  Medication Sig Dispense Refill  . amLODipine (NORVASC) 5 MG tablet Take 5 mg by mouth every morning.       Marland Kitchen aspirin 325 MG tablet Take 325 mg by mouth every morning.       Marland Kitchen atorvastatin (LIPITOR) 80 MG tablet Take 80 mg by mouth at bedtime.       . Multiple Vitamins-Minerals (MULTIVITAMINS THER. W/MINERALS) TABS Take 1 tablet by mouth every morning.       . niacin (NIASPAN) 1000 MG CR tablet Take 2,000 mg by mouth at bedtime.       . nitroGLYCERIN (NITROLINGUAL) 0.4 MG/SPRAY spray Place 2 sprays under the tongue every 5 (five) minutes x 3 doses as needed for chest pain. For chest pain.      . pantoprazole (PROTONIX) 40 MG tablet Take 1 tablet (40 mg total) by mouth daily.  30 tablet  11  .  prasugrel (EFFIENT) 10 MG TABS Take 10 mg by mouth every morning.      . ramipril (ALTACE) 2.5 MG capsule Take 2.5 mg by mouth every morning.       . ranolazine (RANEXA) 1000 MG SR tablet Take 1 tablet (1,000 mg total) by mouth 2 (two) times daily.       No current facility-administered medications on file prior to visit.    Allergies  Allergen Reactions  . Atarax (Hydroxyzine Hcl)     Weakness, out of this world feeling.  . Latex Rash    Past Medical History  Diagnosis Date  . Myocardial infarction   . CAD (coronary artery disease) 04/12/2011    remote MI in 1996, 2 stents of the LAD and 1 stent in 2000 and again in 2009; moderate LV dysfunction - last cath 2/14 - managed medically   . HTN (hypertension) 04/12/2011  . Hyperlipemia 04/12/2011  . Angina   . OSA (obstructive sleep apnea)     on CPAP  . Osteoarthritis   . DM (diabetes mellitus)     Past Surgical History  Procedure Laterality Date  . Coronary stent placement    . Tonsillectomy and adenoidectomy      History  Smoking status  . Former Smoker  .  Quit date: 09/18/2007  Smokeless tobacco  . Never Used    History  Alcohol Use  . Yes    Comment: rarely    Family History  Problem Relation Age of Onset  . COPD Mother   . Cancer Father   . Stroke Father   . Stroke Sister     Review of Systems: The review of systems is per the HPI.  All other systems were reviewed and are negative.  Physical Exam: BP 104/60  Pulse 104  Resp 16  Ht 5\' 8"  (1.727 m)  Wt 206 lb 6.4 oz (93.622 kg)  BMI 31.39 kg/m2 Patient is very pleasant and in no acute distress. Skin is warm and dry. Color is normal.  HEENT is unremarkable. Normocephalic/atraumatic. PERRL. Sclera are nonicteric. Neck is supple. No masses. No JVD. Lungs are clear. Cardiac exam shows a regular rate and rhythm. Abdomen is soft. Extremities are without edema. Gait and ROM are intact. No gross neurologic deficits noted.   LABORATORY DATA:  Lab Results    Component Value Date   WBC 5.4 06/25/2012   HGB 14.9 06/25/2012   HCT 42.8 06/25/2012   PLT 170 06/25/2012   GLUCOSE 110* 06/23/2012   CHOL 125 04/13/2011   CHOL 125 04/13/2011   TRIG 66 04/13/2011   TRIG 65 04/13/2011   HDL 35* 04/13/2011   HDL 34* 04/13/2011   LDLCALC 77 04/13/2011   LDLCALC 78 04/13/2011   ALT 26 04/13/2011   AST 18 04/13/2011   NA 136 06/23/2012   K 3.9 06/23/2012   CL 101 06/23/2012   CREATININE 1.0 06/23/2012   BUN 14 06/23/2012   CO2 27 06/23/2012   TSH 1.342 04/13/2011   INR 1.1* 06/23/2012   HGBA1C 6.9* 04/13/2011    Assessment / Plan: 1. CAD - extensive history - with recent cath - continue with medical management. Will refer on to GI for evaluation.   2. LV dysfunction - moderate with EF 40%. I have increased his Coreg to 9.75 mg BID. Would like to try and get him on more of a heart failure regimen. Hopefully his blood pressure will tolerate. We certainly have enough heart rate to work with.   He will see Dr. Swaziland in 6 weeks. Coreg is increased today. CV risk factor modification is encouraged.   Patient is agreeable to this plan and will call if any problems develop in the interim.

## 2012-07-06 NOTE — Patient Instructions (Addendum)
We are going to increase the Coreg to 9.75 mg two times a day (1 1/2 tablets 2 x day)  Ok to return to the gym  We will refer you to the GI doctor  See Dr. Swaziland  Call the [facility] office at [phone] if you have any questions, problems or concerns.

## 2012-07-14 ENCOUNTER — Encounter: Payer: Self-pay | Admitting: Cardiology

## 2012-07-19 ENCOUNTER — Encounter: Payer: Self-pay | Admitting: Internal Medicine

## 2012-07-20 ENCOUNTER — Ambulatory Visit: Admitting: Internal Medicine

## 2012-08-16 ENCOUNTER — Telehealth: Payer: Self-pay | Admitting: Internal Medicine

## 2012-08-16 ENCOUNTER — Ambulatory Visit: Admitting: Internal Medicine

## 2012-08-16 NOTE — Telephone Encounter (Signed)
Per Dr. Perry, no charge 

## 2012-08-16 NOTE — Telephone Encounter (Signed)
No charge. 

## 2012-09-09 ENCOUNTER — Encounter: Payer: Self-pay | Admitting: Internal Medicine

## 2012-09-09 ENCOUNTER — Ambulatory Visit: Admitting: Cardiology

## 2012-09-09 ENCOUNTER — Ambulatory Visit (INDEPENDENT_AMBULATORY_CARE_PROVIDER_SITE_OTHER): Admitting: Internal Medicine

## 2012-09-09 VITALS — BP 122/72 | HR 80 | Ht 67.25 in | Wt 202.0 lb

## 2012-09-09 DIAGNOSIS — I2510 Atherosclerotic heart disease of native coronary artery without angina pectoris: Secondary | ICD-10-CM

## 2012-09-09 DIAGNOSIS — R1013 Epigastric pain: Secondary | ICD-10-CM

## 2012-09-09 NOTE — Progress Notes (Signed)
HISTORY OF PRESENT ILLNESS:  [name] is a 62 y.o. male with multiple medical problems including hypertension, hyperlipidemia, obstructive sleep apnea, coronary artery disease with multiple prior myocardial infarctions and prior coronary artery stent placement to the LAD in 1996, 2000, in 2009. Moderate LV dysfunction. On chronic anticoagulation in the form of Effient. He is sent today regarding noncardiac chest pain. Patient developed chest pain earlier that she underwent cardiac catheterization. Stable disease in the LAD noted. Treated medically. Seen in followup in the cardiology office 07/06/2012. Since that time he has been on daily pantoprazole therapy. GI followup at this time. The patient describes a six-month history of focal aching discomfort at or just below the xiphoid process. The discomfort lasts for 3-4 days then resolves. This has been recurrent. The discomfort does not wake am. Is not affected by meals or activity. No improvement on PPI. No history of GERD. No dysphagia. No weight loss. No abdominal pain. No change in bowel habits. Attempted colonoscopy about 6 years ago failed due to inability to take prep. Patient states he cannot take one swallow of prep. He does have occasional gas and bloating belching. No melena or hematochezia.  REVIEW OF SYSTEMS:  All non-GI ROS negative except for arthritis  Past Medical History  Diagnosis Date  . Myocardial infarction     x 5  . CAD (coronary artery disease) 04/12/2011    remote MI in 1996, 2 stents of the LAD and 1 stent in 2000 and again in 2009; moderate LV dysfunction - last cath 2/14 - managed medically   . HTN (hypertension) 04/12/2011  . Hyperlipemia 04/12/2011  . Angina   . OSA (obstructive sleep apnea)     on CPAP  . Osteoarthritis   . DM (diabetes mellitus)     pt denies 09/09/2012 sd  . ED (erectile dysfunction)     Past Surgical History  Procedure Laterality Date  . Coronary stent placement      x 7  . Tonsillectomy  and adenoidectomy      Social History [name]  reports that he quit smoking about 4 years ago. His smoking use included Cigarettes. He smoked 0.00 packs per day. He has never used smokeless tobacco. He reports that  drinks alcohol. He reports that he does not use illicit drugs.  family history includes COPD in his mother; Heart disease in his father; Other in his father; Prostate cancer in his father; and Stroke in his father and sister.  Allergies  Allergen Reactions  . Atarax (Hydroxyzine Hcl)     Weakness, out of this world feeling.  . Latex Rash       PHYSICAL EXAMINATION: Vital signs: BP 122/72  Pulse 80  Ht 5' 7.25" (1.708 m)  Wt 202 lb (91.627 kg)  BMI 31.41 kg/m2  Constitutional: generally well-appearing, no acute distress Psychiatric: alert and oriented x3, cooperative Eyes: extraocular movements intact, anicteric, conjunctiva pink Mouth: oral pharynx moist, no lesions Neck: supple no lymphadenopathy Cardiovascular: heart regular rate and rhythm, no murmur Lungs: clear to auscultation bilaterally Abdomen: soft, nontender, nondistended, no obvious ascites, no peritoneal signs, normal bowel sounds, no organomegaly Extremities: no lower extremity edema bilaterally Skin: no lesions on visible extremities Neuro: No focal deficits. No asterixis.    ASSESSMENT:  #1. Atypical musculoskeletal sounding pain at or below the xiphoid process. No response to PPI. Negative recent cardiac evaluation. This does not sound GI. #2. Multiple medical problems including significant CAD #3. Colon cancer screening. Failed attempt previously. The  patient interested in reattempt with current prep regimens. I did discuss possibility of antiemetics etc. #4. Needs PCP. I reminded him. He says he is looking for a PCP  PLAN:  #1. He could continue PPI for ulcer prophylaxis as he is on Effient and aspirin. #2. Abdominal ultrasound further evaluate pain. Rule out gallstones #3. If  ultrasound remarkable. No additional GI workup indicated

## 2012-09-09 NOTE — Patient Instructions (Addendum)
You have been scheduled for an abdominal ultrasound at [facility] (1st floor of hospital) on 09-14-12 at 9:30am. Please arrive 15 minutes prior to your appointment for registration. Make certain not to have anything to eat or drink 6 hours prior to your appointment. Should you need to reschedule your appointment, please contact radiology at [phone]. This test typically takes about 30 minutes to perform.

## 2012-09-14 ENCOUNTER — Ambulatory Visit (HOSPITAL_COMMUNITY)

## 2012-11-28 ENCOUNTER — Other Ambulatory Visit: Payer: Self-pay | Admitting: Internal Medicine

## 2012-11-29 NOTE — Telephone Encounter (Signed)
Ramipril, effient and ranexa refilled w/0 refills.  Needs appt.

## 2012-12-28 ENCOUNTER — Other Ambulatory Visit: Payer: Self-pay | Admitting: *Deleted

## 2012-12-28 ENCOUNTER — Ambulatory Visit (INDEPENDENT_AMBULATORY_CARE_PROVIDER_SITE_OTHER): Admitting: Cardiovascular Disease

## 2012-12-28 ENCOUNTER — Encounter: Payer: Self-pay | Admitting: Cardiovascular Disease

## 2012-12-28 VITALS — BP 118/78 | HR 72 | Ht 68.0 in | Wt 203.5 lb

## 2012-12-28 DIAGNOSIS — E785 Hyperlipidemia, unspecified: Secondary | ICD-10-CM

## 2012-12-28 DIAGNOSIS — R079 Chest pain, unspecified: Secondary | ICD-10-CM

## 2012-12-28 DIAGNOSIS — I10 Essential (primary) hypertension: Secondary | ICD-10-CM

## 2012-12-28 DIAGNOSIS — I2510 Atherosclerotic heart disease of native coronary artery without angina pectoris: Secondary | ICD-10-CM

## 2012-12-28 MED ORDER — NIACIN ER (ANTIHYPERLIPIDEMIC) 1000 MG PO TBCR: 2000.0000 mg | EXTENDED_RELEASE_TABLET | Freq: Every day | ORAL | Status: DC

## 2012-12-28 MED ORDER — CARVEDILOL 6.25 MG PO TABS: 6.2500 mg | ORAL_TABLET | Freq: Two times a day (BID) | ORAL | Status: DC

## 2012-12-28 MED ORDER — NITROGLYCERIN 0.4 MG/SPRAY TL SOLN: 1.0000 | Status: DC | PRN

## 2012-12-28 MED ORDER — RANOLAZINE ER 1000 MG PO TB12: ORAL_TABLET | ORAL | Status: DC

## 2012-12-28 MED ORDER — AMLODIPINE BESYLATE 5 MG PO TABS: 5.0000 mg | ORAL_TABLET | Freq: Every morning | ORAL | Status: DC

## 2012-12-28 MED ORDER — ATORVASTATIN CALCIUM 80 MG PO TABS: 80.0000 mg | ORAL_TABLET | Freq: Every day | ORAL | Status: DC

## 2012-12-28 MED ORDER — PRASUGREL HCL 10 MG PO TABS: ORAL_TABLET | ORAL | Status: DC

## 2012-12-28 NOTE — Patient Instructions (Addendum)
You are doing well. No medication changes were made.  Please call the office to make an appointment for lab work  Please call us if you have new issues that need to be addressed before your next appt.  Your physician wants you to follow-up in: 6 months.  You will receive a reminder letter in the mail two months in advance. If you don't receive a letter, please call our office to schedule the follow-up appointment.

## 2012-12-28 NOTE — Progress Notes (Signed)
Patient ID: [name], male    DOB: 05-Apr-1951, 62 y.o.   MRN: [id number]  HPI Comments: [name] is a pleasant 62 year old white male with his first myocardial infarction in 1996.   heart attack in 1997 and  stenting of the LAD and diagonal branches in [facility]. In 2009 he presented here with a myocardial infarction. He had a critical stenosis in the proximal LAD that was  Stented,  long stent in the mid LAD and in the diagonal branch. He had angioplasty of the jailed diagonal branch at that time.  In 2012 he presented again with recurrent chest pain. Cardiac catheterization demonstrated a long tubular stenosis in the mid LAD at the site of previous stent. He had a flow wire analysis which showed impaired flow at 0.75. He underwent cutting balloon angioplasty this lesion. The notes report dye staining consistent with a dissection. Evaluation by [name] suggested a perforation grade 1. He had 2 additional stents placed and the perforation sealed. He did well for a period of time but very presented in early 2014 with chest pain .  Repeat catheterization at that time did not show any significant progression of his disease. He has single vessel obstructive CAD involving the distal LAD and was unchanged from his prior study in 2012. Extensive stents in the proximal to mid LAD and 1st DX were patent. He has moderate LV dysfunction. EF is 40%. Medical management recommended  Since that time he has felt well. He does report having an episode of severe chest pain during intense personal stress 2 weeks ago. He took nitroglycerin and his symptoms slowly resolved. Nitroglycerin spray his old, expired in 62 Since then he has had no further episodes of chest pain it feels well. He is otherwise active with no complaints.  EKG shows normal sinus rhythm with rate 72 beats per minute, no significant ST or T wave changes. No changes compared to his prior EKGs   Outpatient Encounter Prescriptions  as of 12/28/2012  Medication Sig Dispense Refill  . amLODipine (NORVASC) 5 MG tablet Take 1 tablet (5 mg total) by mouth every morning.  30 tablet  3  . aspirin 325 MG tablet Take 325 mg by mouth every morning.       Marland Kitchen atorvastatin (LIPITOR) 80 MG tablet Take 1 tablet (80 mg total) by mouth at bedtime.  30 tablet  3  . carvedilol (COREG) 6.25 MG tablet Take 1 tablet (6.25 mg total) by mouth 2 (two) times daily with a meal.  60 tablet  3  . Multiple Vitamins-Minerals (MULTIVITAMINS THER. W/MINERALS) TABS Take 1 tablet by mouth every morning.       . niacin (NIASPAN) 1000 MG CR tablet Take 2 tablets (2,000 mg total) by mouth at bedtime.  60 tablet  3  . prasugrel (EFFIENT) 10 MG TABS tablet TAKE ONE TABLET BY MOUTH DAILY  30 each  3  . ramipril (ALTACE) 2.5 MG capsule Take 2.5 mg by mouth every morning.       . ranolazine (RANEXA) 1000 MG SR tablet TAKE ONE TABLET BY MOUTH EVERY 12 HOURS  60 each  3    Review of Systems  Constitutional: Negative.   HENT: Negative.   Eyes: Negative.   Respiratory: Negative.   Cardiovascular: Negative.   Gastrointestinal: Negative.   Musculoskeletal: Negative.   Skin: Negative.   Neurological: Negative.   Psychiatric/Behavioral: Negative.   All other systems reviewed and are negative.    BP 118/78  Pulse 72  Ht 5\' 8"  (1.727 m)  Wt 203 lb 8 oz (92.307 kg)  BMI 30.95 kg/m2  Physical Exam  Nursing note and vitals reviewed. Constitutional: He is oriented to person, place, and time. He appears well-developed and well-nourished.  HENT:  Head: Normocephalic.  Nose: Nose normal.  Mouth/Throat: Oropharynx is clear and moist.  Eyes: Conjunctivae are normal. Pupils are equal, round, and reactive to light.  Neck: Normal range of motion. Neck supple. No JVD present.  Cardiovascular: Normal rate, regular rhythm, S1 normal, S2 normal, normal heart sounds and intact distal pulses.  Exam reveals no gallop and no friction rub.   No murmur  heard. Pulmonary/Chest: Effort normal and breath sounds normal. No respiratory distress. He has no wheezes. He has no rales. He exhibits no tenderness.  Abdominal: Soft. Bowel sounds are normal. He exhibits no distension. There is no tenderness.  Musculoskeletal: Normal range of motion. He exhibits no edema and no tenderness.  Lymphadenopathy:    He has no cervical adenopathy.  Neurological: He is alert and oriented to person, place, and time. Coordination normal.  Skin: Skin is warm and dry. No rash noted. No erythema.  Psychiatric: He has a normal mood and affect. His behavior is normal. Judgment and thought content normal.      Assessment and Plan

## 2012-12-28 NOTE — Assessment & Plan Note (Signed)
We have suggested he come back for lab work in the near future, fasting

## 2012-12-28 NOTE — Assessment & Plan Note (Signed)
Currently with no symptoms of angina. No further workup at this time. Continue current medication regimen. 

## 2012-12-28 NOTE — Assessment & Plan Note (Signed)
Blood pressure is well controlled on today's visit. No changes made to the medications. 

## 2012-12-29 ENCOUNTER — Telehealth: Payer: Self-pay

## 2012-12-29 NOTE — Telephone Encounter (Signed)
Pharmacy calling regarding question regarding nitroglycerin Please call

## 2012-12-29 NOTE — Telephone Encounter (Signed)
Pharmacist Lowella Bandy called from [facility] in [place] stating that she cannot fill NTG order without Dr. Windell Hummingbird permission after being made aware that pt is taking Viagra.  She states that she does not have a prescribing MD for it b/c he is getting it mail order.  She is holding the NTG until she hears back from our office.

## 2012-12-30 NOTE — Telephone Encounter (Signed)
Okay to give nitroglycerin.  Would encourage patient not to take sublingual nitroglycerin 24 hours after taking Viagra.

## 2012-12-31 NOTE — Telephone Encounter (Signed)
Called spoke with pharmacist advised ok to fill NTG rx.    Called pt advised not to take NTG within 24 hrs  After taking Viagra.

## 2013-01-25 ENCOUNTER — Other Ambulatory Visit: Payer: Self-pay | Admitting: Internal Medicine

## 2013-01-25 NOTE — Telephone Encounter (Signed)
Patient is now seen at [facility] by Dr. Mariah Milling, most recently. Hasn't been seen here at [facility] since 10/2011. Refer to Dr. Mariah Milling or to PCP for refills.

## 2013-01-25 NOTE — Telephone Encounter (Signed)
Patient was just seen by Dr. Mariah Milling at [facility] in August.

## 2013-01-26 ENCOUNTER — Other Ambulatory Visit: Payer: Self-pay

## 2013-01-26 MED ORDER — AMLODIPINE BESYLATE 5 MG PO TABS: 5.0000 mg | ORAL_TABLET | Freq: Every morning | ORAL | Status: DC

## 2013-01-26 MED ORDER — RAMIPRIL 2.5 MG PO CAPS: 2.5000 mg | ORAL_CAPSULE | Freq: Every morning | ORAL | Status: DC

## 2013-01-26 NOTE — Telephone Encounter (Signed)
Refill sent for ramipril 2.5 mg take one tablet daily.

## 2013-01-26 NOTE — Telephone Encounter (Signed)
Refill sent for ramipril 5 mg the patient is aware of taking 2.5 mg.

## 2013-01-26 NOTE — Telephone Encounter (Signed)
Is this patient to take ramipril 2.5 mg or 5 mg daily. The patient is not sure and chart shows 2.5 mg at last visit but not sure if you had changed to  5 mg. Please advise so can send in a refill for patient today.

## 2013-02-25 ENCOUNTER — Other Ambulatory Visit: Payer: Self-pay | Admitting: *Deleted

## 2013-02-25 ENCOUNTER — Telehealth: Payer: Self-pay | Admitting: *Deleted

## 2013-02-25 NOTE — Telephone Encounter (Signed)
Pt requesting refill on Viagra. Pt is going out of town this weekend and would like some sent to [facility] in [place]. Medications is not on current med list or I don't see where pt was given Rx before. Please advise.

## 2013-02-27 ENCOUNTER — Other Ambulatory Visit: Payer: Self-pay | Admitting: Cardiovascular Disease

## 2013-02-27 MED ORDER — SILDENAFIL CITRATE 100 MG PO TABS: 100.0000 mg | ORAL_TABLET | Freq: Every day | ORAL | Status: DC | PRN

## 2013-02-27 NOTE — Telephone Encounter (Signed)
i sent in script Would not take with NTG

## 2013-03-01 NOTE — Telephone Encounter (Signed)
lmtcb to inform pt of Rx being sent and to not take Viagra with Nitro.

## 2013-03-04 ENCOUNTER — Other Ambulatory Visit: Payer: Self-pay | Admitting: Cardiovascular Disease

## 2013-03-04 MED ORDER — NIACIN ER (ANTIHYPERLIPIDEMIC) 1000 MG PO TBCR: 2000.0000 mg | EXTENDED_RELEASE_TABLET | Freq: Every day | ORAL | Status: DC

## 2013-03-10 ENCOUNTER — Other Ambulatory Visit: Payer: Self-pay | Admitting: Cardiovascular Disease

## 2013-03-10 NOTE — Telephone Encounter (Signed)
[facility] send to [facility] for this [facility] and not [facility]

## 2013-03-14 ENCOUNTER — Other Ambulatory Visit: Payer: Self-pay | Admitting: *Deleted

## 2013-03-14 MED ORDER — SILDENAFIL CITRATE 100 MG PO TABS: 100.0000 mg | ORAL_TABLET | Freq: Every day | ORAL | Status: DC | PRN

## 2013-03-14 NOTE — Telephone Encounter (Signed)
Requested Prescriptions   Signed Prescriptions Disp Refills  . sildenafil (VIAGRA) 100 MG tablet 7 tablet 5    Sig: Take 1 tablet (100 mg total) by mouth daily as needed for erectile dysfunction.    Authorizing Provider: Antonieta Iba    Ordering User: Kendrick Fries

## 2013-06-29 ENCOUNTER — Other Ambulatory Visit: Payer: Self-pay | Admitting: Cardiovascular Disease

## 2013-07-18 ENCOUNTER — Ambulatory Visit: Admitting: Cardiovascular Disease

## 2013-07-18 ENCOUNTER — Encounter: Payer: Self-pay | Admitting: *Deleted

## 2013-07-27 ENCOUNTER — Ambulatory Visit (INDEPENDENT_AMBULATORY_CARE_PROVIDER_SITE_OTHER): Admitting: Cardiovascular Disease

## 2013-07-27 ENCOUNTER — Encounter: Payer: Self-pay | Admitting: Cardiovascular Disease

## 2013-07-27 VITALS — BP 118/68 | HR 81 | Ht 68.0 in | Wt 208.0 lb

## 2013-07-27 DIAGNOSIS — I209 Angina pectoris, unspecified: Secondary | ICD-10-CM

## 2013-07-27 DIAGNOSIS — I10 Essential (primary) hypertension: Secondary | ICD-10-CM

## 2013-07-27 DIAGNOSIS — I208 Other forms of angina pectoris: Secondary | ICD-10-CM

## 2013-07-27 DIAGNOSIS — E785 Hyperlipidemia, unspecified: Secondary | ICD-10-CM

## 2013-07-27 DIAGNOSIS — I2510 Atherosclerotic heart disease of native coronary artery without angina pectoris: Secondary | ICD-10-CM

## 2013-07-27 DIAGNOSIS — R079 Chest pain, unspecified: Secondary | ICD-10-CM

## 2013-07-27 MED ORDER — RAMIPRIL 2.5 MG PO CAPS: 2.5000 mg | ORAL_CAPSULE | Freq: Every morning | ORAL | Status: DC

## 2013-07-27 MED ORDER — PRASUGREL HCL 10 MG PO TABS: ORAL_TABLET | ORAL | Status: DC

## 2013-07-27 MED ORDER — NIACIN ER (ANTIHYPERLIPIDEMIC) 1000 MG PO TBCR: 2000.0000 mg | EXTENDED_RELEASE_TABLET | Freq: Every day | ORAL | Status: DC

## 2013-07-27 MED ORDER — RANOLAZINE ER 1000 MG PO TB12: ORAL_TABLET | ORAL | Status: DC

## 2013-07-27 MED ORDER — CARVEDILOL 6.25 MG PO TABS: 6.2500 mg | ORAL_TABLET | Freq: Two times a day (BID) | ORAL | Status: DC

## 2013-07-27 MED ORDER — ATORVASTATIN CALCIUM 80 MG PO TABS: 80.0000 mg | ORAL_TABLET | Freq: Every day | ORAL | Status: DC

## 2013-07-27 MED ORDER — AMLODIPINE BESYLATE 5 MG PO TABS: 5.0000 mg | ORAL_TABLET | Freq: Every morning | ORAL | Status: DC

## 2013-07-27 NOTE — Assessment & Plan Note (Signed)
Blood pressure is well controlled on today's visit. No changes made to the medications. 

## 2013-07-27 NOTE — Assessment & Plan Note (Signed)
No longer having angina at rest. He cut back on his coffee, stress in his life has improved. Now no longer having symptoms
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
## 2013-07-27 NOTE — Assessment & Plan Note (Signed)
Currently with no symptoms of angina. No further workup at this time. Continue current medication regimen. 

## 2013-07-27 NOTE — Patient Instructions (Signed)
You are doing well. No medication changes were made.  Please call us if you have new issues that need to be addressed before your next appt.  Your physician wants you to follow-up in: 6 months.  You will receive a reminder letter in the mail two months in advance. If you don't receive a letter, please call our office to schedule the follow-up appointment.   

## 2013-07-27 NOTE — Assessment & Plan Note (Signed)
We have recommended he have his cholesterol checked on his next visit to primary care. Goal LDL less than 70

## 2013-08-08 ENCOUNTER — Ambulatory Visit (INDEPENDENT_AMBULATORY_CARE_PROVIDER_SITE_OTHER): Admitting: Adult Health

## 2013-08-08 ENCOUNTER — Encounter: Payer: Self-pay | Admitting: Adult Health

## 2013-08-08 VITALS — BP 116/66 | HR 82 | Temp 98.3°F | Resp 14 | Ht 68.2 in | Wt 208.0 lb

## 2013-08-08 DIAGNOSIS — G479 Sleep disorder, unspecified: Secondary | ICD-10-CM | POA: Insufficient documentation

## 2013-08-08 NOTE — Patient Instructions (Signed)
   Thank you for choosing [place] at [facility] for your health care needs.  Please schedule your annual physical exam at your earliest convenience.  You will have blood work done on that day so you will need to be fasting.

## 2013-08-08 NOTE — Progress Notes (Signed)
Patient ID: [name], male   DOB: 28-Feb-1951, 63 y.o.   MRN: [id number]    Subjective:    Patient ID: [name], male    DOB: 1951-03-19, 63 y.o.   MRN: [id number]  HPI  Pt is a 63 y/o male who presents to clinic to establish care. Has not had PCP. He is followed by Dr. Rockey Situ, Cardiology. Pt reports that he has not had a physical exam in years. Will schedule this within the next month. Overall feeling well.   Past Medical History  Diagnosis Date  . Myocardial infarction     x 5  . CAD (coronary artery disease) 04/12/2011    remote MI in 1996, 2 stents of the LAD and 1 stent in 2000 and again in 2009; moderate LV dysfunction - last cath 2/14 - managed medically   . HTN (hypertension) 04/12/2011  . Hyperlipemia 04/12/2011  . Angina   . OSA (obstructive sleep apnea)     on CPAP  . Osteoarthritis   . ED (erectile dysfunction)      Past Surgical History  Procedure Laterality Date  . Coronary stent placement      x 7  . Tonsillectomy and adenoidectomy    . Coronary angioplasty       Family History  Problem Relation Age of Onset  . COPD Mother   . Other Father     muscle tumor  . Stroke Father   . Stroke Sister   . Prostate cancer Father     mets  . Heart disease Father      History   Social History  . Marital Status: Legally Separated    Spouse Name: N/A    Number of Children: 2  . Years of Education: N/A   Occupational History  . [employer]-retired   . Sales    Social History Main Topics  . Smoking status: Former Smoker    Types: Cigarettes    Quit date: 09/14/2007  . Smokeless tobacco: Never Used  . Alcohol Use: Yes     Comment: rarely  . Drug Use: No     Comment: pt stopped fall 2012  . Sexual Activity: Yes   Other Topics Concern  . Not on file   Social History Narrative  . No narrative on file     Current Outpatient Prescriptions on File Prior to Visit  Medication Sig Dispense Refill  . amLODipine (NORVASC) 5 MG tablet Take 1 tablet (5 mg  total) by mouth every morning.  90 tablet  3  . aspirin 325 MG tablet Take 325 mg by mouth every morning.       Marland Kitchen atorvastatin (LIPITOR) 80 MG tablet Take 1 tablet (80 mg total) by mouth daily at 6 PM.  90 tablet  3  . carvedilol (COREG) 6.25 MG tablet Take 1 tablet (6.25 mg total) by mouth 2 (two) times daily with a meal.  180 tablet  3  . Multiple Vitamins-Minerals (MULTIVITAMINS THER. W/MINERALS) TABS Take 1 tablet by mouth every morning.       . niacin (NIASPAN) 1000 MG CR tablet Take 2 tablets (2,000 mg total) by mouth at bedtime.  180 tablet  3  . nitroGLYCERIN (NITROLINGUAL) 0.4 MG/SPRAY spray Place 1 spray under the tongue every 5 (five) minutes as needed for chest pain.  12 g  12  . prasugrel (EFFIENT) 10 MG TABS tablet TAKE ONE TABLET BY MOUTH DAILY  90 each  3  . ramipril (ALTACE) 2.5 MG capsule Take  1 capsule (2.5 mg total) by mouth every morning.  90 capsule  3  . ranolazine (RANEXA) 1000 MG SR tablet TAKE ONE TABLET BY MOUTH EVERY 12 HOURS  180 each  3  . sildenafil (VIAGRA) 100 MG tablet Take 1 tablet (100 mg total) by mouth daily as needed for erectile dysfunction.  7 tablet  5   No current facility-administered medications on file prior to visit.     Review of Systems  Constitutional: Negative.   HENT: Negative.   Eyes: Negative.   Respiratory: Negative.   Cardiovascular: Negative.   Gastrointestinal: Negative.  Negative for blood in stool.  Endocrine: Negative.   Genitourinary: Negative.   Musculoskeletal: Negative.   Skin: Negative.   Allergic/Immunologic: Negative.   Neurological: Negative.   Hematological: Negative.   Psychiatric/Behavioral: Positive for sleep disturbance (occasional episodes of trouble sleeping).       Objective:  BP 116/66  Pulse 82  Temp(Src) 98.3 F (36.8 C) (Oral)  Resp 14  Ht 5' 8.2" (1.732 m)  Wt 208 lb (94.348 kg)  BMI 31.45 kg/m2  SpO2 95%   Physical Exam  Constitutional: He is oriented to person, place, and time. He appears  well-developed and well-nourished. No distress.  HENT:  Head: Normocephalic and atraumatic.  Eyes: Conjunctivae and EOM are normal.  Neck: Normal range of motion. Neck supple.  Cardiovascular: Normal rate, regular rhythm, normal heart sounds and intact distal pulses.  Exam reveals no gallop and no friction rub.   No murmur heard. Pulmonary/Chest: Effort normal and breath sounds normal. No respiratory distress. He has no wheezes. He has no rales.  Musculoskeletal: Normal range of motion. He exhibits no edema and no tenderness.  Neurological: He is alert and oriented to person, place, and time. He has normal reflexes. No cranial nerve deficit. Coordination normal.  Skin: Skin is warm and dry.  Psychiatric: He has a normal mood and affect. His behavior is normal. Judgment and thought content normal.       Assessment & Plan:   1. Sleep disturbance Occasional problems with sleep. He does not like taking any medication for sleep secondary to side effects (feeling in a fog, loopy). Discussed trying OTC melatonin. Start with 5 mg at bedtime as needed. May increase to 10 mg as needed.

## 2013-08-08 NOTE — Progress Notes (Signed)
Pre visit review using our clinic review tool, if applicable. No additional management support is needed unless otherwise documented below in the visit note. 

## 2013-09-01 ENCOUNTER — Ambulatory Visit (INDEPENDENT_AMBULATORY_CARE_PROVIDER_SITE_OTHER): Admitting: Adult Health

## 2013-09-01 ENCOUNTER — Encounter: Payer: Self-pay | Admitting: Adult Health

## 2013-09-01 ENCOUNTER — Encounter (INDEPENDENT_AMBULATORY_CARE_PROVIDER_SITE_OTHER): Payer: Self-pay

## 2013-09-01 VITALS — BP 130/74 | HR 79 | Temp 98.2°F | Resp 16 | Ht 68.5 in | Wt 209.0 lb

## 2013-09-01 DIAGNOSIS — Z1211 Encounter for screening for malignant neoplasm of colon: Secondary | ICD-10-CM

## 2013-09-01 DIAGNOSIS — Z125 Encounter for screening for malignant neoplasm of prostate: Secondary | ICD-10-CM

## 2013-09-01 DIAGNOSIS — Z0000 Encounter for general adult medical examination without abnormal findings: Secondary | ICD-10-CM

## 2013-09-01 DIAGNOSIS — R361 Hematospermia: Secondary | ICD-10-CM | POA: Insufficient documentation

## 2013-09-01 DIAGNOSIS — E785 Hyperlipidemia, unspecified: Secondary | ICD-10-CM

## 2013-09-01 DIAGNOSIS — R7309 Other abnormal glucose: Secondary | ICD-10-CM

## 2013-09-01 DIAGNOSIS — I10 Essential (primary) hypertension: Secondary | ICD-10-CM

## 2013-09-01 LAB — CBC WITH DIFFERENTIAL/PLATELET
BASOS PCT: 0.8 % (ref 0.0–3.0)
Basophils Absolute: 0 10*3/uL (ref 0.0–0.1)
EOS PCT: 5.6 % — AB (ref 0.0–5.0)
Eosinophils Absolute: 0.3 10*3/uL (ref 0.0–0.7)
HCT: 45 % (ref 39.0–52.0)
HEMOGLOBIN: 15.1 g/dL (ref 13.0–17.0)
LYMPHS ABS: 2 10*3/uL (ref 0.7–4.0)
Lymphocytes Relative: 38.3 % (ref 12.0–46.0)
MCHC: 33.6 g/dL (ref 30.0–36.0)
MCV: 96.2 fl (ref 78.0–100.0)
Monocytes Absolute: 0.5 10*3/uL (ref 0.1–1.0)
Monocytes Relative: 9.2 % (ref 3.0–12.0)
Neutro Abs: 2.5 10*3/uL (ref 1.4–7.7)
Neutrophils Relative %: 46.1 % (ref 43.0–77.0)
Platelets: 168 10*3/uL (ref 150.0–400.0)
RBC: 4.68 Mil/uL (ref 4.22–5.81)
RDW: 13.5 % (ref 11.5–14.6)
WBC: 5.3 10*3/uL (ref 4.5–10.5)

## 2013-09-01 LAB — LIPID PANEL
CHOL/HDL RATIO: 4
Cholesterol: 176 mg/dL (ref 0–200)
HDL: 45.5 mg/dL (ref >39.00)
LDL CALC: 117 mg/dL — AB (ref 0–99)
Triglycerides: 66 mg/dL (ref 0.0–149.0)
VLDL: 13.2 mg/dL (ref 0.0–40.0)

## 2013-09-01 LAB — URINALYSIS, ROUTINE W REFLEX MICROSCOPIC
Bilirubin Urine: NEGATIVE
HGB URINE DIPSTICK: NEGATIVE
Ketones, ur: NEGATIVE
LEUKOCYTES UA: NEGATIVE
NITRITE: NEGATIVE
Specific Gravity, Urine: 1.015 (ref 1.000–1.030)
Total Protein, Urine: NEGATIVE
URINE GLUCOSE: NEGATIVE
UROBILINOGEN UA: 0.2 (ref 0.0–1.0)
pH: 7.5 (ref 5.0–8.0)

## 2013-09-01 LAB — BASIC METABOLIC PANEL
BUN: 13 mg/dL (ref 6–23)
CO2: 31 mEq/L (ref 19–32)
Calcium: 9.6 mg/dL (ref 8.4–10.5)
Chloride: 101 mEq/L (ref 96–112)
Creatinine, Ser: 0.9 mg/dL (ref 0.4–1.5)
GFR: 88.38 mL/min (ref >60.00)
Glucose, Bld: 127 mg/dL — ABNORMAL HIGH (ref 70–99)
Potassium: 4.3 mEq/L (ref 3.5–5.1)
SODIUM: 138 meq/L (ref 135–145)

## 2013-09-01 LAB — HEPATIC FUNCTION PANEL
ALBUMIN: 4.2 g/dL (ref 3.5–5.2)
ALT: 25 U/L (ref 0–53)
AST: 21 U/L (ref 0–37)
Alkaline Phosphatase: 64 U/L (ref 39–117)
Bilirubin, Direct: 0.2 mg/dL (ref 0.0–0.3)
Total Bilirubin: 1.9 mg/dL — ABNORMAL HIGH (ref 0.3–1.2)
Total Protein: 7 g/dL (ref 6.0–8.3)

## 2013-09-01 LAB — PSA: PSA: 1.24 ng/mL (ref 0.10–4.00)

## 2013-09-01 MED ORDER — ZOSTER VACCINE LIVE 19400 UNT/0.65ML ~~LOC~~ SOLR: 0.6500 mL | Freq: Once | SUBCUTANEOUS | Status: DC

## 2013-09-01 NOTE — Progress Notes (Signed)
Pre visit review using our clinic review tool, if applicable. No additional management support is needed unless otherwise documented below in the visit note. 

## 2013-09-01 NOTE — Progress Notes (Signed)
Subjective:    Patient ID: [name], male    DOB: 1951/02/19, 63 y.o.   MRN: [id number]  HPI Pt is a 63 y/o male with multiple medical problems who presents to clinic for his yearly physical exam. He has been doing well. He is compliant with his medications. No side effects reported. He reports that he has noted what he thinks may be blood in his ejaculate. He is not experiencing any symptoms. No trouble urinating. No pain or dysuria. He denies fever or chills.    Past Medical History  Diagnosis Date  . Myocardial infarction     x 5  . CAD (coronary artery disease) 04/12/2011    remote MI in 1996, 2 stents of the LAD and 1 stent in 2000 and again in 2009; moderate LV dysfunction - last cath 2/14 - managed medically   . HTN (hypertension) 04/12/2011  . Hyperlipemia 04/12/2011  . Angina   . OSA (obstructive sleep apnea)     on CPAP  . Osteoarthritis   . ED (erectile dysfunction)      Past Surgical History  Procedure Laterality Date  . Coronary stent placement      x 7  . Tonsillectomy and adenoidectomy    . Coronary angioplasty       Family History  Problem Relation Age of Onset  . COPD Mother   . Other Father     muscle tumor  . Stroke Father   . Stroke Sister   . Prostate cancer Father     mets  . Heart disease Father      History   Social History  . Marital Status: Legally Separated    Spouse Name: N/A    Number of Children: 2  . Years of Education: N/A   Occupational History  . [employer]-retired   . Sales    Social History Main Topics  . Smoking status: Former Smoker    Types: Cigarettes    Quit date: 09/14/2007  . Smokeless tobacco: Never Used  . Alcohol Use: Yes     Comment: rarely  . Drug Use: No     Comment: pt stopped fall 2012  . Sexual Activity: Yes   Other Topics Concern  . Not on file   Social History Narrative  . No narrative on file    Review of Systems  Constitutional: Negative.   HENT: Negative.   Eyes: Negative.     Respiratory: Negative.  Negative for cough, shortness of breath and wheezing.   Cardiovascular: Negative.  Negative for chest pain, palpitations and leg swelling.  Gastrointestinal: Negative.   Endocrine: Negative.   Genitourinary: Negative for dysuria, urgency, frequency, hematuria, flank pain, scrotal swelling, difficulty urinating and testicular pain.       Blood tinged ejaculate  Musculoskeletal: Negative.   Skin: Negative.   Allergic/Immunologic: Negative.   Neurological: Negative.   Hematological: Negative.   Psychiatric/Behavioral: Negative.        Objective:   Physical Exam  Constitutional: He is oriented to person, place, and time. He appears well-developed and well-nourished. No distress.  HENT:  Head: Normocephalic and atraumatic.  Right Ear: External ear normal.  Left Ear: External ear normal.  Nose: Nose normal.  Mouth/Throat: Oropharynx is clear and moist.  Eyes: Conjunctivae and EOM are normal. Pupils are equal, round, and reactive to light.  Neck: Normal range of motion. Neck supple. No tracheal deviation present. No thyromegaly present.  Cardiovascular: Normal rate, regular rhythm, normal heart sounds and intact  distal pulses.  Exam reveals no gallop and no friction rub.   No murmur heard. Pulmonary/Chest: Effort normal and breath sounds normal. No respiratory distress. He has no wheezes. He has no rales.  Abdominal: Soft. Bowel sounds are normal. He exhibits no distension and no mass. There is no tenderness. There is no rebound and no guarding. Hernia confirmed negative in the right inguinal area and confirmed negative in the left inguinal area.  Genitourinary: Rectum normal, prostate normal, testes normal and penis normal. Guaiac negative stool. Right testis shows no mass, no swelling and no tenderness. Right testis is descended. Left testis shows no mass, no swelling and no tenderness. Left testis is descended. Uncircumcised. No phimosis, paraphimosis, hypospadias,  penile erythema or penile tenderness. No discharge found.  Musculoskeletal: Normal range of motion. He exhibits no edema and no tenderness.  Lymphadenopathy:    He has no cervical adenopathy.       Right: No inguinal adenopathy present.       Left: No inguinal adenopathy present.  Neurological: He is alert and oriented to person, place, and time. He has normal reflexes. No cranial nerve deficit. Coordination normal.  Skin: Skin is warm and dry.  Psychiatric: He has a normal mood and affect. His behavior is normal. Judgment and thought content normal.       Assessment & Plan:   1. Routine general medical examination at a health care facility Normal physical exam. Screenings were all addressed. Prescription for the shingles vaccine sent to his pharmacy for administration.  - CBC with Differential   2. HLD (hyperlipidemia) On medication. Follows heart healthy diet. Check labs. Continue to follow - Lipid panel - Hepatic function panel  3. HTN (hypertension) Blood pressure controlled. Continue meds. Check labs. Continue to follow - Basic metabolic panel  4. Elevated glucose Hx of elevated blood glucose levels. Check HgbA1c.  - Hemoglobin A1c; Future  5. Screening for prostate cancer Discussed PSA testing for screening and implications if elevated levels - further testing, referral to urology and possible biopsy. Test is prostate specific but not cancer specific. He would like to proceed with testing. - PSA  6. Screen for colon cancer Did not tolerate the prep he was prescribed the first time he was scheduled for colonoscopy. He never had the colonoscopy. I will refer her to GI for screening. They will discuss options for prep that may be more tolerable for him - Ambulatory referral to Gastroenterology  7. Blood in semen Will check urinalysis to see if possible infection. Prostate exam was normal.  - Urinalysis, Routine w reflex microscopic - POCT urinalysis dipstick - Urine  Culture

## 2013-09-02 ENCOUNTER — Telehealth: Payer: Self-pay | Admitting: Adult Health

## 2013-09-02 NOTE — Telephone Encounter (Signed)
Relevant patient education assigned to patient using Emmi. ° °

## 2013-09-03 LAB — URINE CULTURE
Colony Count: NO GROWTH
Organism ID, Bacteria: NO GROWTH

## 2013-09-04 ENCOUNTER — Other Ambulatory Visit: Payer: Self-pay | Admitting: Adult Health

## 2013-09-04 DIAGNOSIS — R361 Hematospermia: Secondary | ICD-10-CM

## 2013-09-05 ENCOUNTER — Other Ambulatory Visit (INDEPENDENT_AMBULATORY_CARE_PROVIDER_SITE_OTHER)

## 2013-09-05 DIAGNOSIS — R7309 Other abnormal glucose: Secondary | ICD-10-CM

## 2013-09-05 LAB — HEMOGLOBIN A1C: HEMOGLOBIN A1C: 6.6 % — AB (ref 4.6–6.5)

## 2013-09-06 ENCOUNTER — Encounter: Payer: Self-pay | Admitting: *Deleted

## 2013-11-08 ENCOUNTER — Ambulatory Visit: Payer: Self-pay | Admitting: Gastroenterology

## 2013-11-08 LAB — HM COLONOSCOPY

## 2013-11-09 LAB — PATHOLOGY REPORT

## 2014-02-10 ENCOUNTER — Other Ambulatory Visit: Payer: Self-pay

## 2014-02-10 MED ORDER — PRASUGREL HCL 10 MG PO TABS: ORAL_TABLET | ORAL | Status: DC

## 2014-02-10 MED ORDER — RANOLAZINE ER 1000 MG PO TB12: ORAL_TABLET | ORAL | Status: DC

## 2014-02-10 MED ORDER — AMLODIPINE BESYLATE 5 MG PO TABS: 5.0000 mg | ORAL_TABLET | Freq: Every morning | ORAL | Status: DC

## 2014-02-10 MED ORDER — NIACIN ER (ANTIHYPERLIPIDEMIC) 1000 MG PO TBCR: 2000.0000 mg | EXTENDED_RELEASE_TABLET | Freq: Every day | ORAL | Status: DC

## 2014-02-10 MED ORDER — RAMIPRIL 2.5 MG PO CAPS: 2.5000 mg | ORAL_CAPSULE | Freq: Every morning | ORAL | Status: DC

## 2014-02-10 MED ORDER — CARVEDILOL 6.25 MG PO TABS
6.2500 mg | ORAL_TABLET | Freq: Two times a day (BID) | ORAL | Status: DC
Start: 2014-02-10 — End: 2015-03-26

## 2014-02-10 MED ORDER — ATORVASTATIN CALCIUM 80 MG PO TABS: 80.0000 mg | ORAL_TABLET | Freq: Every day | ORAL | Status: DC

## 2014-02-10 NOTE — Telephone Encounter (Signed)
Refill sent for amlodipine.  

## 2014-02-10 NOTE — Telephone Encounter (Signed)
[facility]

## 2014-02-27 ENCOUNTER — Ambulatory Visit: Admitting: Cardiovascular Disease

## 2014-03-13 ENCOUNTER — Encounter: Payer: Self-pay | Admitting: Cardiovascular Disease

## 2014-03-13 ENCOUNTER — Ambulatory Visit (INDEPENDENT_AMBULATORY_CARE_PROVIDER_SITE_OTHER): Admitting: Cardiovascular Disease

## 2014-03-13 VITALS — BP 120/62 | HR 76 | Ht 68.0 in | Wt 211.8 lb

## 2014-03-13 DIAGNOSIS — I25119 Atherosclerotic heart disease of native coronary artery with unspecified angina pectoris: Secondary | ICD-10-CM

## 2014-03-13 DIAGNOSIS — Z23 Encounter for immunization: Secondary | ICD-10-CM

## 2014-03-13 DIAGNOSIS — I159 Secondary hypertension, unspecified: Secondary | ICD-10-CM

## 2014-03-13 DIAGNOSIS — E785 Hyperlipidemia, unspecified: Secondary | ICD-10-CM

## 2014-03-13 MED ORDER — ZOSTER VACCINE LIVE 19400 UNT/0.65ML ~~LOC~~ SOLR: 0.6500 mL | Freq: Once | SUBCUTANEOUS | Status: DC

## 2014-03-13 NOTE — Assessment & Plan Note (Signed)
Cholesterol is above goal. Recommended weight loss. If no improvement, may need to adjust his medications, possibly had zetia 10 mg daily. This was discussed with him
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
## 2014-03-13 NOTE — Patient Instructions (Addendum)
You are doing well. No medication changes were made. Please restart your exercise , watch the portions  Please cut down on the aspirin to 81 mg x 1 to 2 a day with effient  Please call us if you have new issues that need to be addressed before your next appt.  Your physician wants you to follow-up in: 6 months.  You will receive a reminder letter in the mail two months in advance. If you don't receive a letter, please call our office to schedule the follow-up appointment.  Your next appointment will be scheduled in our new office located at :  [facility]  [address], [place]  [place], [place] [address]

## 2014-03-13 NOTE — Assessment & Plan Note (Signed)
Currently with no symptoms of angina. No further workup at this time. Continue current medication regimen. 

## 2014-03-13 NOTE — Assessment & Plan Note (Signed)
Blood pressure is well controlled on today's visit. No changes made to the medications. 

## 2014-04-19 ENCOUNTER — Encounter (HOSPITAL_COMMUNITY): Payer: Self-pay | Admitting: Internal Medicine

## 2014-04-20 ENCOUNTER — Encounter (HOSPITAL_COMMUNITY): Payer: Self-pay | Admitting: Cardiology

## 2014-11-01 ENCOUNTER — Encounter: Payer: Self-pay | Admitting: Cardiovascular Disease

## 2014-11-01 ENCOUNTER — Ambulatory Visit (INDEPENDENT_AMBULATORY_CARE_PROVIDER_SITE_OTHER): Admitting: Cardiovascular Disease

## 2014-11-01 VITALS — BP 120/76 | HR 90 | Ht 68.0 in | Wt 197.5 lb

## 2014-11-01 DIAGNOSIS — I25119 Atherosclerotic heart disease of native coronary artery with unspecified angina pectoris: Secondary | ICD-10-CM

## 2014-11-01 DIAGNOSIS — I159 Secondary hypertension, unspecified: Secondary | ICD-10-CM

## 2014-11-01 DIAGNOSIS — E785 Hyperlipidemia, unspecified: Secondary | ICD-10-CM | POA: Diagnosis not present

## 2014-11-01 NOTE — Assessment & Plan Note (Signed)
Orders placed to have lab work checked, fasting at his convenience

## 2014-11-01 NOTE — Patient Instructions (Signed)
You are doing well. No medication changes were made.  We will check your liver and lipids, fasting  Please call us if you have new issues that need to be addressed before your next appt.  Your physician wants you to follow-up in: 6 months.  You will receive a reminder letter in the mail two months in advance. If you don't receive a letter, please call our office to schedule the follow-up appointment.

## 2014-11-01 NOTE — Assessment & Plan Note (Signed)
Currently with no symptoms of angina. No further workup at this time. Continue current medication regimen. 

## 2014-11-01 NOTE — Assessment & Plan Note (Signed)
Blood pressure is well controlled on today's visit. No changes made to the medications. 
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
## 2014-11-17 ENCOUNTER — Encounter: Payer: Self-pay | Admitting: Nurse Practitioner

## 2014-11-17 ENCOUNTER — Ambulatory Visit (INDEPENDENT_AMBULATORY_CARE_PROVIDER_SITE_OTHER): Admitting: Nurse Practitioner

## 2014-11-17 VITALS — BP 106/58 | HR 85 | Temp 97.9°F | Resp 18 | Ht 68.0 in | Wt 195.2 lb

## 2014-11-17 DIAGNOSIS — I25119 Atherosclerotic heart disease of native coronary artery with unspecified angina pectoris: Secondary | ICD-10-CM | POA: Diagnosis not present

## 2014-11-17 DIAGNOSIS — Z0000 Encounter for general adult medical examination without abnormal findings: Secondary | ICD-10-CM | POA: Diagnosis not present

## 2014-11-17 DIAGNOSIS — IMO0001 Reserved for inherently not codable concepts without codable children: Secondary | ICD-10-CM

## 2014-11-17 DIAGNOSIS — E785 Hyperlipidemia, unspecified: Secondary | ICD-10-CM

## 2014-11-17 DIAGNOSIS — I159 Secondary hypertension, unspecified: Secondary | ICD-10-CM | POA: Diagnosis not present

## 2014-11-17 DIAGNOSIS — E1165 Type 2 diabetes mellitus with hyperglycemia: Secondary | ICD-10-CM

## 2014-11-17 MED ORDER — NITROGLYCERIN 0.4 MG/SPRAY TL SOLN: 1.0000 | Status: DC | PRN

## 2014-11-17 NOTE — Patient Instructions (Signed)

## 2014-11-17 NOTE — Progress Notes (Signed)
Pre visit review using our clinic review tool, if applicable. No additional management support is needed unless otherwise documented below in the visit note. 

## 2014-11-17 NOTE — Progress Notes (Signed)
Subjective:    Patient ID: [name], male    DOB: 1950-12-08, 64 y.o.   MRN: [id number]  HPI  [name] is a 64 yo male with a need for annual physical exam.   1) Health Maintenance-   Diet- No formal   Exercise- No formal, active   Immunizations- Not UTD  Colonoscopy- a few years ago   Eye Exam- Last year   Dental Exam- Not UTD   2) Chronic Problems-  Sees Dr. Rockey Situ yearly   ED- not sexually active  CPAP- leaking, dog barking at night, naps during the day  3) Acute Problems- Needs refill on Nitroglycerin SL   Review of Systems  Constitutional: Negative for fever, chills, diaphoresis and fatigue.  HENT: Negative for tinnitus and trouble swallowing.   Eyes: Negative for visual disturbance.  Respiratory: Negative for chest tightness, shortness of breath and wheezing.   Cardiovascular: Negative for chest pain, palpitations and leg swelling.  Gastrointestinal: Negative for nausea, vomiting, diarrhea and constipation.  Genitourinary: Negative for penile swelling, scrotal swelling and difficulty urinating.  Musculoskeletal: Negative for back pain and neck pain.  Skin: Negative for rash.  Neurological: Negative for dizziness, weakness, numbness and headaches.  Psychiatric/Behavioral: Negative for suicidal ideas and sleep disturbance. The patient is not nervous/anxious.    Past Medical History  Diagnosis Date  . Myocardial infarction     x 5  . CAD (coronary artery disease) 04/12/2011    remote MI in 1996, 2 stents of the LAD and 1 stent in 2000 and again in 2009; moderate LV dysfunction - last cath 2/14 - managed medically   . HTN (hypertension) 04/12/2011  . Hyperlipemia 04/12/2011  . Angina   . OSA (obstructive sleep apnea)     on CPAP  . Osteoarthritis   . ED (erectile dysfunction)     History   Social History  . Marital Status: Legally Separated    Spouse Name: N/A  . Number of Children: 2  . Years of Education: N/A   Occupational History  .  [employer]ired   . Sales    Social History Main Topics  . Smoking status: Former Smoker    Types: Cigarettes    Quit date: 09/14/2007  . Smokeless tobacco: Never Used  . Alcohol Use: 0.0 oz/week    0 Standard drinks or equivalent per week     Comment: rarely  . Drug Use: No     Comment: pt stopped fall 2012  . Sexual Activity: No   Other Topics Concern  . Not on file   Social History Narrative   Retired twice    Working with rescue dogs    Lives with girlfriend   Pet: 1 snake ([place] snake), 3 dogs    Caffeine- coffee 3 cups a day, tea- unsweet     Past Surgical History  Procedure Laterality Date  . Coronary stent placement      x 7  . Tonsillectomy and adenoidectomy    . Coronary angioplasty    . Left heart catheterization with coronary angiogram N/A 04/14/2011    Procedure: LEFT HEART CATHETERIZATION WITH CORONARY ANGIOGRAM;  Surgeon: Pixie Casino, MD;  Location: [facility] CATH LAB;  Service: Cardiovascular;  Laterality: N/A;  . Left heart catheterization with coronary angiogram N/A 06/25/2012    Procedure: LEFT HEART CATHETERIZATION WITH CORONARY ANGIOGRAM;  Surgeon: Peter M Martinique, MD;  Location: [facility] CATH LAB;  Service: Cardiovascular;  Laterality: N/A;    Family History  Problem  Relation Age of Onset  . COPD Mother   . Other Father     muscle tumor  . Stroke Father   . Stroke Sister   . Prostate cancer Father     mets  . Heart disease Father     Allergies  Allergen Reactions  . Atarax [Hydroxyzine Hcl]     Weakness, out of this world feeling.  . Latex Rash    Current Outpatient Prescriptions on File Prior to Visit  Medication Sig Dispense Refill  . amLODipine (NORVASC) 5 MG tablet Take 1 tablet (5 mg total) by mouth every morning. 90 tablet 3  . atorvastatin (LIPITOR) 80 MG tablet Take 1 tablet (80 mg total) by mouth daily at 6 PM. 90 tablet 3  . carvedilol (COREG) 6.25 MG tablet Take 1 tablet (6.25 mg total) by mouth 2 (two) times daily with a meal. 180  tablet 3  . Multiple Vitamins-Minerals (MULTIVITAMINS THER. W/MINERALS) TABS Take 1 tablet by mouth every morning.     . niacin (NIASPAN) 1000 MG CR tablet Take 2 tablets (2,000 mg total) by mouth at bedtime. 180 tablet 3  . prasugrel (EFFIENT) 10 MG TABS tablet TAKE ONE TABLET BY MOUTH DAILY 90 each 3  . ramipril (ALTACE) 2.5 MG capsule Take 1 capsule (2.5 mg total) by mouth every morning. 90 capsule 3  . ranolazine (RANEXA) 1000 MG SR tablet TAKE ONE TABLET BY MOUTH EVERY 12 HOURS 180 each 3  . sildenafil (VIAGRA) 100 MG tablet Take 1 tablet (100 mg total) by mouth daily as needed for erectile dysfunction. 7 tablet 5  . zoster vaccine live, PF, (ZOSTAVAX) 32992 UNT/0.65ML injection Inject 19,400 Units into the skin once. 1 each 0  . aspirin EC 81 MG tablet Take 1 tablet (81 mg total) by mouth daily. 90 tablet 3   No current facility-administered medications on file prior to visit.       Objective:   Physical Exam  Constitutional: He is oriented to person, place, and time. He appears well-developed and well-nourished. No distress.  BP 106/58 mmHg  Pulse 85  Temp(Src) 97.9 F (36.6 C)  Resp 18  Ht 5\' 8"  (1.727 m)  Wt 195 lb 3.2 oz (88.542 kg)  BMI 29.69 kg/m2  SpO2 96%   HENT:  Head: Normocephalic and atraumatic.  Right Ear: External ear normal.  Left Ear: External ear normal.  Nose: Nose normal.  Mouth/Throat: Oropharynx is clear and moist. No oropharyngeal exudate.  TM's clear bilaterally  Eyes: Conjunctivae and EOM are normal. Pupils are equal, round, and reactive to light. Right eye exhibits no discharge. Left eye exhibits no discharge. No scleral icterus.  Neck: Normal range of motion. Neck supple. No thyromegaly present.  Cardiovascular: Normal rate, regular rhythm, normal heart sounds and intact distal pulses.  Exam reveals no gallop and no friction rub.   No murmur heard. Pulmonary/Chest: Effort normal and breath sounds normal. No respiratory distress. He has no wheezes.  He has no rales. He exhibits no tenderness.  Abdominal: Soft. Bowel sounds are normal. He exhibits no distension and no mass. There is no tenderness. There is no rebound and no guarding.  Musculoskeletal: Normal range of motion. He exhibits no edema or tenderness.  Lymphadenopathy:    He has no cervical adenopathy.  Neurological: He is alert and oriented to person, place, and time. He displays normal reflexes. No cranial nerve deficit. He exhibits normal muscle tone. Coordination normal.  Skin: Skin is warm and dry. No rash noted.  He is not diaphoretic.  Psychiatric: He has a normal mood and affect. His behavior is normal. Judgment and thought content normal.       Assessment & Plan:

## 2014-11-22 ENCOUNTER — Other Ambulatory Visit: Payer: Self-pay | Admitting: Nurse Practitioner

## 2014-11-22 ENCOUNTER — Other Ambulatory Visit: Payer: Self-pay | Admitting: Cardiovascular Disease

## 2014-11-22 LAB — HEPATIC FUNCTION PANEL
ALK PHOS: 105 U/L (ref 25–125)
ALT: 34 U/L (ref 10–40)
AST: 19 U/L (ref 14–40)
BILIRUBIN DIRECT: 0.3 mg/dL (ref 0.01–0.4)
BILIRUBIN, TOTAL: 1.1 mg/dL

## 2014-11-22 LAB — LIPID PANEL
Cholesterol: 143 mg/dL (ref 0–200)
HDL: 50 mg/dL (ref 35–70)
LDL CALC: 79 mg/dL
TRIGLYCERIDES: 68 mg/dL (ref 40–160)

## 2014-11-23 LAB — COMPREHENSIVE METABOLIC PANEL
ALBUMIN: 4.5 g/dL (ref 3.6–4.8)
ALT: 34 IU/L (ref 0–44)
AST: 23 IU/L (ref 0–40)
Albumin/Globulin Ratio: 2 (ref 1.1–2.5)
Alkaline Phosphatase: 108 IU/L (ref 39–117)
BILIRUBIN TOTAL: 1.1 mg/dL (ref 0.0–1.2)
BUN/Creatinine Ratio: 16 (ref 10–22)
BUN: 15 mg/dL (ref 8–27)
CO2: 22 mmol/L (ref 18–29)
Calcium: 9.6 mg/dL (ref 8.6–10.2)
Chloride: 96 mmol/L — ABNORMAL LOW (ref 97–108)
Creatinine, Ser: 0.92 mg/dL (ref 0.76–1.27)
GFR calc Af Amer: 101 mL/min/{1.73_m2} (ref >59)
GFR calc non Af Amer: 88 mL/min/{1.73_m2} (ref >59)
GLOBULIN, TOTAL: 2.3 g/dL (ref 1.5–4.5)
Glucose: 332 mg/dL — ABNORMAL HIGH (ref 65–99)
POTASSIUM: 4.2 mmol/L (ref 3.5–5.2)
Sodium: 138 mmol/L (ref 134–144)
Total Protein: 6.8 g/dL (ref 6.0–8.5)

## 2014-11-23 LAB — TSH: TSH: 0.919 u[IU]/mL (ref 0.450–4.500)

## 2014-11-23 LAB — CBC WITH DIFFERENTIAL/PLATELET
BASOS ABS: 0 10*3/uL (ref 0.0–0.2)
BASOS: 0 %
EOS (ABSOLUTE): 0.1 10*3/uL (ref 0.0–0.4)
Eos: 2 %
HEMATOCRIT: 47.4 % (ref 37.5–51.0)
Hemoglobin: 15.5 g/dL (ref 12.6–17.7)
IMMATURE GRANS (ABS): 0 10*3/uL (ref 0.0–0.1)
IMMATURE GRANULOCYTES: 0 %
LYMPHS: 35 %
Lymphocytes Absolute: 1.8 10*3/uL (ref 0.7–3.1)
MCH: 31.6 pg (ref 26.6–33.0)
MCHC: 32.7 g/dL (ref 31.5–35.7)
MCV: 97 fL (ref 79–97)
Monocytes Absolute: 0.3 10*3/uL (ref 0.1–0.9)
Monocytes: 7 %
NEUTROS ABS: 2.8 10*3/uL (ref 1.4–7.0)
NEUTROS PCT: 56 %
Platelets: 172 10*3/uL (ref 150–379)
RBC: 4.91 x10E6/uL (ref 4.14–5.80)
RDW: 12.9 % (ref 12.3–15.4)
WBC: 5.1 10*3/uL (ref 3.4–10.8)

## 2014-11-23 LAB — HEPATIC FUNCTION PANEL
ALK PHOS: 105 IU/L (ref 39–117)
ALT: 34 IU/L (ref 0–44)
AST: 19 IU/L (ref 0–40)
Albumin: 4.8 g/dL (ref 3.6–4.8)
BILIRUBIN TOTAL: 1.1 mg/dL (ref 0.0–1.2)
Bilirubin, Direct: 0.3 mg/dL (ref 0.00–0.40)
Total Protein: 7 g/dL (ref 6.0–8.5)

## 2014-11-23 LAB — LIPID PANEL W/O CHOL/HDL RATIO
CHOLESTEROL TOTAL: 143 mg/dL (ref 100–199)
HDL: 50 mg/dL (ref >39)
LDL Calculated: 79 mg/dL (ref 0–99)
Triglycerides: 68 mg/dL (ref 0–149)
VLDL Cholesterol Cal: 14 mg/dL (ref 5–40)

## 2014-11-23 LAB — HGB A1C W/O EAG: HEMOGLOBIN A1C: 12.2 % — AB (ref 4.8–5.6)

## 2014-11-30 DIAGNOSIS — Z0000 Encounter for general adult medical examination without abnormal findings: Secondary | ICD-10-CM | POA: Insufficient documentation

## 2014-11-30 DIAGNOSIS — E1165 Type 2 diabetes mellitus with hyperglycemia: Secondary | ICD-10-CM | POA: Insufficient documentation

## 2014-11-30 DIAGNOSIS — IMO0002 Reserved for concepts with insufficient information to code with codable children: Secondary | ICD-10-CM | POA: Insufficient documentation

## 2014-11-30 NOTE — Assessment & Plan Note (Signed)
A1c last year 2015- 6.6    Lab Results  Component Value Date   HGBA1C 12.2* 11/22/2014   Large jump in A1c. Called pt and asked him to schedule Diabetes follow up ASAP to discuss and start on medication.

## 2014-11-30 NOTE — Assessment & Plan Note (Addendum)
Discussed acute and chronic issues. Reviewed health maintenance measures, PFSHx, and immunizations.   Discussed importance of diet and exercise.

## 2014-11-30 NOTE — Assessment & Plan Note (Signed)
Obtain Lipid panel.

## 2014-11-30 NOTE — Assessment & Plan Note (Signed)
Obtained lipid panel and HFP. Will follow. Continue on lipitor and niacin watch diet.

## 2014-11-30 NOTE — Assessment & Plan Note (Signed)
BP Readings from Last 3 Encounters:  11/17/14 106/58  11/01/14 120/76  03/13/14 120/62   Sees Dr. Rockey Situ for cardiac check yearly. Continue medications.

## 2014-12-13 ENCOUNTER — Ambulatory Visit (INDEPENDENT_AMBULATORY_CARE_PROVIDER_SITE_OTHER): Admitting: Nurse Practitioner

## 2014-12-13 ENCOUNTER — Encounter (INDEPENDENT_AMBULATORY_CARE_PROVIDER_SITE_OTHER): Payer: Self-pay

## 2014-12-13 VITALS — BP 110/68 | HR 87 | Temp 98.2°F | Resp 18 | Ht 68.0 in | Wt 191.6 lb

## 2014-12-13 DIAGNOSIS — IMO0001 Reserved for inherently not codable concepts without codable children: Secondary | ICD-10-CM

## 2014-12-13 DIAGNOSIS — E1165 Type 2 diabetes mellitus with hyperglycemia: Secondary | ICD-10-CM

## 2014-12-13 NOTE — Progress Notes (Signed)
   Subjective:    Patient ID: [name], male    DOB: 22-Feb-1951, 64 y.o.   MRN: [id number]  HPI  [name] is a 64 yo male with a follow up of diabetes.   1) Diabetes- going to the breast store and picking up honey buns and sweets, went to decaf sodas from caffeine he reports.   6.6 to 12.2 in 1 year   Ever since pt got results he has cut out carbs, limiting sodas to 1 glass a day or maybe none, pt cut way back.  2 weeks of this   Wt Readings from Last 3 Encounters:  12/13/14 191 lb 9.6 oz (86.909 kg)  11/17/14 195 lb 3.2 oz (88.542 kg)  11/01/14 197 lb 8 oz (89.585 kg)    Treadmill, stomach and back, getting back into gym  2) CPAP- wearing same CPAP since 2001, changed mask, rubbing nasal bridge raw, scabbed   1 month of trying with nasal mask  Silver sulfate using on bridge of nose  Review of Systems  Constitutional: Negative for fever, chills, diaphoresis and fatigue.  Eyes: Negative for visual disturbance.  Respiratory: Negative for chest tightness, shortness of breath and wheezing.   Cardiovascular: Negative for chest pain, palpitations and leg swelling.  Endocrine: Negative for polydipsia, polyphagia and polyuria.  Neurological: Negative for dizziness, weakness and numbness.  Psychiatric/Behavioral: The patient is not nervous/anxious.       Objective:   Physical Exam  Constitutional: He is oriented to person, place, and time. He appears well-developed and well-nourished. No distress.  BP 110/68 mmHg  Pulse 87  Temp(Src) 98.2 F (36.8 C)  Resp 18  Ht 5\' 8"  (1.727 m)  Wt 191 lb 9.6 oz (86.909 kg)  BMI 29.14 kg/m2  SpO2 95%   HENT:  Head: Normocephalic and atraumatic.  Right Ear: External ear normal.  Left Ear: External ear normal.  Cardiovascular: Normal rate, regular rhythm and normal heart sounds.  Exam reveals no gallop and no friction rub.   No murmur heard. Pulmonary/Chest: Effort normal and breath sounds normal. No respiratory distress.  He has no wheezes. He has no rales. He exhibits no tenderness.  Neurological: He is alert and oriented to person, place, and time.  Skin: Skin is warm and dry. No rash noted. He is not diaphoretic.  Scab on nose from irritation by CPAP mask  Psychiatric: He has a normal mood and affect. His behavior is normal. Judgment and thought content normal.      Assessment & Plan:

## 2014-12-13 NOTE — Patient Instructions (Signed)
See you in Oct. (14th or after) to repeat A1c.

## 2014-12-18 ENCOUNTER — Encounter: Payer: Self-pay | Admitting: Nurse Practitioner

## 2014-12-18 NOTE — Assessment & Plan Note (Signed)
Pt is very adamant about not taking medications or doing anything other than diet and exercise at this time. I gave him information about what the sugar in circulation does to the microvascular system if there is insulin resistance. He is aware of possible damage and other concerns due to high BS. He would like to re-check A1c in 3 months. Will FU then.

## 2015-02-26 ENCOUNTER — Encounter: Payer: Self-pay | Admitting: Nurse Practitioner

## 2015-02-26 ENCOUNTER — Ambulatory Visit (INDEPENDENT_AMBULATORY_CARE_PROVIDER_SITE_OTHER): Admitting: Nurse Practitioner

## 2015-02-26 ENCOUNTER — Other Ambulatory Visit

## 2015-02-26 VITALS — BP 122/80 | HR 95 | Temp 98.5°F | Resp 18 | Ht 68.0 in | Wt 200.8 lb

## 2015-02-26 DIAGNOSIS — E1165 Type 2 diabetes mellitus with hyperglycemia: Secondary | ICD-10-CM | POA: Diagnosis not present

## 2015-02-26 DIAGNOSIS — Z23 Encounter for immunization: Secondary | ICD-10-CM | POA: Diagnosis not present

## 2015-02-26 DIAGNOSIS — IMO0002 Reserved for concepts with insufficient information to code with codable children: Secondary | ICD-10-CM | POA: Insufficient documentation

## 2015-02-26 DIAGNOSIS — E118 Type 2 diabetes mellitus with unspecified complications: Secondary | ICD-10-CM | POA: Diagnosis not present

## 2015-02-26 DIAGNOSIS — IMO0001 Reserved for inherently not codable concepts without codable children: Secondary | ICD-10-CM

## 2015-02-26 LAB — BASIC METABOLIC PANEL
BUN: 15 mg/dL (ref 6–23)
CHLORIDE: 103 meq/L (ref 96–112)
CO2: 27 mEq/L (ref 19–32)
Calcium: 9.6 mg/dL (ref 8.4–10.5)
Creatinine, Ser: 1.15 mg/dL (ref 0.40–1.50)
GFR: 67.99 mL/min (ref >60.00)
Glucose, Bld: 404 mg/dL — ABNORMAL HIGH (ref 70–99)
Potassium: 4.4 mEq/L (ref 3.5–5.1)
Sodium: 138 mEq/L (ref 135–145)

## 2015-02-26 LAB — HEMOGLOBIN A1C: HEMOGLOBIN A1C: 11.5 % — AB (ref 4.6–6.5)

## 2015-02-26 NOTE — Patient Instructions (Signed)
Please visit the lab today. Keep up the good work with diet and exercise.   Thank you for getting your flu vaccine today.

## 2015-02-26 NOTE — Progress Notes (Signed)
Pre visit review using our clinic review tool, if applicable. No additional management support is needed unless otherwise documented below in the visit note. 

## 2015-02-26 NOTE — Assessment & Plan Note (Signed)
Re-checking A1c and BMET today. Pt just urinated and is unable to give a sample for urine microalbumin. Pt has made drastic lifestyle changes and I am confident his A1c will be moving in the right direction. Discussed goals, use of medications, and diabetes care.

## 2015-02-26 NOTE — Progress Notes (Signed)
Patient ID: [name], male    DOB: 1950/09/07  Age: 64 y.o. MRN: [id number]  CC: Follow-up   HPI [name] presents for follow up of diabetes.   1) A1c 12.2 in July. Pt reports he is watching sugars in his diet and exercising more.   Diet- Decreased sugar intake drastically, Stopped sodas   Exercise- walks dogs, very active Eye exam- Glasses, will get one next year  Urine Microalbumin- today A1c- repeat today   CPAP- found a plug in humidifier at a yard sale   History [name] has a past medical history of Myocardial infarction; CAD (coronary artery disease) (04/12/2011); HTN (hypertension) (04/12/2011); Hyperlipemia (04/12/2011); Angina; OSA (obstructive sleep apnea); Osteoarthritis; and ED (erectile dysfunction).   He has past surgical history that includes Coronary stent placement; Tonsillectomy and adenoidectomy; Coronary angioplasty; left heart catheterization with coronary angiogram (N/A, 04/14/2011); and left heart catheterization with coronary angiogram (N/A, 06/25/2012).   His family history includes COPD in his mother; Heart disease in his father; Other in his father; Prostate cancer in his father; Stroke in his father and sister.He reports that he quit smoking about 7 years ago. His smoking use included Cigarettes. He has never used smokeless tobacco. He reports that he drinks alcohol. He reports that he does not use illicit drugs.  Outpatient Prescriptions Prior to Visit  Medication Sig Dispense Refill  . amLODipine (NORVASC) 5 MG tablet Take 1 tablet (5 mg total) by mouth every morning. 90 tablet 3  . aspirin EC 81 MG tablet Take 1 tablet (81 mg total) by mouth daily. 90 tablet 3  . atorvastatin (LIPITOR) 80 MG tablet Take 1 tablet (80 mg total) by mouth daily at 6 PM. 90 tablet 3  . carvedilol (COREG) 6.25 MG tablet Take 1 tablet (6.25 mg total) by mouth 2 (two) times daily with a meal. 180 tablet 3  . Multiple Vitamins-Minerals (MULTIVITAMINS THER.  W/MINERALS) TABS Take 1 tablet by mouth every morning.     . niacin (NIASPAN) 1000 MG CR tablet Take 2 tablets (2,000 mg total) by mouth at bedtime. 180 tablet 3  . nitroGLYCERIN (NITROLINGUAL) 0.4 MG/SPRAY spray Place 1 spray under the tongue every 5 (five) minutes as needed for chest pain. 12 g 12  . prasugrel (EFFIENT) 10 MG TABS tablet TAKE ONE TABLET BY MOUTH DAILY 90 each 3  . ramipril (ALTACE) 2.5 MG capsule Take 1 capsule (2.5 mg total) by mouth every morning. 90 capsule 3  . ranolazine (RANEXA) 1000 MG SR tablet TAKE ONE TABLET BY MOUTH EVERY 12 HOURS 180 each 3  . sildenafil (VIAGRA) 100 MG tablet Take 1 tablet (100 mg total) by mouth daily as needed for erectile dysfunction. 7 tablet 5  . zoster vaccine live, PF, (ZOSTAVAX) 95093 UNT/0.65ML injection Inject 19,400 Units into the skin once. 1 each 0   No facility-administered medications prior to visit.    ROS Review of Systems  Constitutional: Negative for fever, chills, diaphoresis and fatigue.  Eyes: Negative for visual disturbance.  Respiratory: Negative for chest tightness, shortness of breath and wheezing.   Cardiovascular: Negative for chest pain, palpitations and leg swelling.  Gastrointestinal: Negative for nausea, vomiting and diarrhea.  Endocrine: Negative for polydipsia, polyphagia and polyuria.  Skin: Negative for rash.  Neurological: Negative for dizziness, weakness and numbness.  Psychiatric/Behavioral: The patient is not nervous/anxious.     Objective:  BP 122/80 mmHg  Pulse 95  Temp(Src) 98.5 F (36.9 C)  Resp 18  Ht 5\' 8"  (  1.727 m)  Wt 200 lb 12.8 oz (91.082 kg)  BMI 30.54 kg/m2  SpO2 96%  Physical Exam  Constitutional: He is oriented to person, place, and time. He appears well-developed and well-nourished. No distress.  HENT:  Head: Normocephalic and atraumatic.  Right Ear: External ear normal.  Left Ear: External ear normal.  Eyes: Right eye exhibits no discharge. Left eye exhibits no discharge.  No scleral icterus.  Glasses  Cardiovascular: Normal rate, regular rhythm, normal heart sounds and intact distal pulses.  Exam reveals no gallop and no friction rub.   No murmur heard. Pulmonary/Chest: Effort normal and breath sounds normal. No respiratory distress. He has no wheezes. He has no rales. He exhibits no tenderness.  Neurological: He is alert and oriented to person, place, and time.  Monofilament essentially normal- left foot has a healed scar from years ago that has a slight decrease in sensation. Able to feel the monofilament device with eyes closed  Skin: Skin is warm and dry. No rash noted. He is not diaphoretic.  Psychiatric: He has a normal mood and affect. His behavior is normal. Judgment and thought content normal.   Assessment & Plan:   [name] was seen today for follow-up.  Diagnoses and all orders for this visit:  Uncontrolled type 2 diabetes mellitus without complication, without long-term current use of insulin (HCC) -     HgB A1c -     Basic Metabolic Panel (BMET)  Other orders -     Cancel: Urine Microalbumin w/creat. ratio   I am having [name] maintain his multivitamins ther. w/minerals, sildenafil, amLODipine, ranolazine, ramipril, prasugrel, atorvastatin, niacin, carvedilol, aspirin EC, zoster vaccine live (PF), and nitroGLYCERIN.  No orders of the defined types were placed in this encounter.     Follow-up: Return if symptoms worsen or fail to improve.

## 2015-03-01 ENCOUNTER — Other Ambulatory Visit: Payer: Self-pay | Admitting: Nurse Practitioner

## 2015-03-01 MED ORDER — METFORMIN HCL 500 MG PO TABS: 500.0000 mg | ORAL_TABLET | Freq: Two times a day (BID) | ORAL | Status: DC

## 2015-03-26 ENCOUNTER — Other Ambulatory Visit: Payer: Self-pay | Admitting: Cardiovascular Disease

## 2015-04-22 ENCOUNTER — Emergency Department

## 2015-04-22 ENCOUNTER — Inpatient Hospital Stay
Admission: EM | Admit: 2015-04-22 | Discharge: 2015-04-23 | DRG: 158 | Disposition: A | Discharge status: Home / Self Care | Attending: Internal Medicine | Admitting: Internal Medicine

## 2015-04-22 ENCOUNTER — Encounter: Payer: Self-pay | Admitting: *Deleted

## 2015-04-22 DIAGNOSIS — M1990 Unspecified osteoarthritis, unspecified site: Secondary | ICD-10-CM | POA: Diagnosis present

## 2015-04-22 DIAGNOSIS — E119 Type 2 diabetes mellitus without complications: Secondary | ICD-10-CM | POA: Diagnosis present

## 2015-04-22 DIAGNOSIS — Z91040 Latex allergy status: Secondary | ICD-10-CM | POA: Diagnosis not present

## 2015-04-22 DIAGNOSIS — G4733 Obstructive sleep apnea (adult) (pediatric): Secondary | ICD-10-CM | POA: Diagnosis present

## 2015-04-22 DIAGNOSIS — I252 Old myocardial infarction: Secondary | ICD-10-CM | POA: Diagnosis not present

## 2015-04-22 DIAGNOSIS — R1310 Dysphagia, unspecified: Secondary | ICD-10-CM | POA: Diagnosis present

## 2015-04-22 DIAGNOSIS — I2510 Atherosclerotic heart disease of native coronary artery without angina pectoris: Secondary | ICD-10-CM | POA: Diagnosis present

## 2015-04-22 DIAGNOSIS — R591 Generalized enlarged lymph nodes: Secondary | ICD-10-CM | POA: Diagnosis present

## 2015-04-22 DIAGNOSIS — Z7984 Long term (current) use of oral hypoglycemic drugs: Secondary | ICD-10-CM

## 2015-04-22 DIAGNOSIS — Z7982 Long term (current) use of aspirin: Secondary | ICD-10-CM | POA: Diagnosis not present

## 2015-04-22 DIAGNOSIS — E785 Hyperlipidemia, unspecified: Secondary | ICD-10-CM | POA: Diagnosis present

## 2015-04-22 DIAGNOSIS — K047 Periapical abscess without sinus: Principal | ICD-10-CM

## 2015-04-22 DIAGNOSIS — Z87891 Personal history of nicotine dependence: Secondary | ICD-10-CM | POA: Diagnosis not present

## 2015-04-22 DIAGNOSIS — I10 Essential (primary) hypertension: Secondary | ICD-10-CM | POA: Diagnosis present

## 2015-04-22 DIAGNOSIS — Z888 Allergy status to other drugs, medicaments and biological substances status: Secondary | ICD-10-CM

## 2015-04-22 DIAGNOSIS — Z955 Presence of coronary angioplasty implant and graft: Secondary | ICD-10-CM | POA: Diagnosis not present

## 2015-04-22 DIAGNOSIS — Z79899 Other long term (current) drug therapy: Secondary | ICD-10-CM

## 2015-04-22 DIAGNOSIS — R220 Localized swelling, mass and lump, head: Secondary | ICD-10-CM

## 2015-04-22 DIAGNOSIS — L03211 Cellulitis of face: Secondary | ICD-10-CM | POA: Diagnosis present

## 2015-04-22 HISTORY — DX: Type 2 diabetes mellitus without complications: E11.9

## 2015-04-22 LAB — CBC WITH DIFFERENTIAL/PLATELET
Basophils Absolute: 0 10*3/uL (ref 0–0.1)
Basophils Relative: 1 %
Eosinophils Absolute: 0.1 10*3/uL (ref 0–0.7)
Eosinophils Relative: 1 %
HEMATOCRIT: 43.1 % (ref 40.0–52.0)
HEMOGLOBIN: 14.9 g/dL (ref 13.0–18.0)
LYMPHS ABS: 1.5 10*3/uL (ref 1.0–3.6)
LYMPHS PCT: 19 %
MCH: 32.2 pg (ref 26.0–34.0)
MCHC: 34.4 g/dL (ref 32.0–36.0)
MCV: 93.5 fL (ref 80.0–100.0)
MONOS PCT: 11 %
Monocytes Absolute: 0.9 10*3/uL (ref 0.2–1.0)
NEUTROS ABS: 5.7 10*3/uL (ref 1.4–6.5)
NEUTROS PCT: 68 %
Platelets: 156 10*3/uL (ref 150–440)
RBC: 4.62 MIL/uL (ref 4.40–5.90)
RDW: 12.5 % (ref 11.5–14.5)
WBC: 8.2 10*3/uL (ref 3.8–10.6)

## 2015-04-22 LAB — GLUCOSE, CAPILLARY
GLUCOSE-CAPILLARY: 185 mg/dL — AB (ref 65–99)
GLUCOSE-CAPILLARY: 205 mg/dL — AB (ref 65–99)
GLUCOSE-CAPILLARY: 199 mg/dL — AB (ref 65–99)
Glucose-Capillary: 182 mg/dL — ABNORMAL HIGH (ref 65–99)

## 2015-04-22 LAB — BASIC METABOLIC PANEL
Anion gap: 13 (ref 5–15)
BUN: 17 mg/dL (ref 6–20)
CHLORIDE: 99 mmol/L — AB (ref 101–111)
CO2: 22 mmol/L (ref 22–32)
CREATININE: 0.77 mg/dL (ref 0.61–1.24)
Calcium: 9.3 mg/dL (ref 8.9–10.3)
GFR calc Af Amer: >60 mL/min (ref >60)
GFR calc non Af Amer: >60 mL/min (ref >60)
Glucose, Bld: 241 mg/dL — ABNORMAL HIGH (ref 65–99)
Potassium: 3.6 mmol/L (ref 3.5–5.1)
Sodium: 134 mmol/L — ABNORMAL LOW (ref 135–145)

## 2015-04-22 LAB — HEMOGLOBIN A1C: HEMOGLOBIN A1C: 11 % — AB (ref 4.0–6.0)

## 2015-04-22 MED ORDER — ONDANSETRON HCL 4 MG PO TABS: 4.0000 mg | ORAL_TABLET | Freq: Four times a day (QID) | ORAL | Status: DC | PRN

## 2015-04-22 MED ORDER — CLINDAMYCIN PHOSPHATE 600 MG/50ML IV SOLN
600.0000 mg | Freq: Once | INTRAVENOUS | Status: AC
  Administered 2015-04-22: 600 mg via INTRAVENOUS
  Filled 2015-04-22: qty 50

## 2015-04-22 MED ORDER — ATORVASTATIN CALCIUM 20 MG PO TABS
80.0000 mg | ORAL_TABLET | Freq: Every day | ORAL | Status: DC
  Administered 2015-04-22: 80 mg via ORAL
  Filled 2015-04-22: qty 4

## 2015-04-22 MED ORDER — MORPHINE SULFATE (PF) 4 MG/ML IV SOLN
4.0000 mg | Freq: Once | INTRAVENOUS | Status: AC
  Administered 2015-04-22: 4 mg via INTRAVENOUS
  Filled 2015-04-22: qty 1

## 2015-04-22 MED ORDER — INSULIN ASPART 100 UNIT/ML ~~LOC~~ SOLN
0.0000 [IU] | Freq: Three times a day (TID) | SUBCUTANEOUS | Status: DC
  Administered 2015-04-22: 3 [IU] via SUBCUTANEOUS
  Administered 2015-04-23: 2 [IU] via SUBCUTANEOUS
  Filled 2015-04-22: qty 3
  Filled 2015-04-22: qty 2

## 2015-04-22 MED ORDER — ONDANSETRON HCL 4 MG/2ML IJ SOLN
4.0000 mg | Freq: Four times a day (QID) | INTRAMUSCULAR | Status: DC | PRN
Start: 2015-04-22 — End: 2015-04-23

## 2015-04-22 MED ORDER — RANOLAZINE ER 500 MG PO TB12
1000.0000 mg | ORAL_TABLET | Freq: Two times a day (BID) | ORAL | Status: DC
  Administered 2015-04-22 – 2015-04-23 (×2): 1000 mg via ORAL
  Filled 2015-04-22 (×3): qty 2

## 2015-04-22 MED ORDER — PRASUGREL HCL 10 MG PO TABS
10.0000 mg | ORAL_TABLET | Freq: Every day | ORAL | Status: DC
  Administered 2015-04-23: 10 mg via ORAL
  Filled 2015-04-22: qty 1

## 2015-04-22 MED ORDER — CARVEDILOL 6.25 MG PO TABS
6.2500 mg | ORAL_TABLET | Freq: Two times a day (BID) | ORAL | Status: DC
  Administered 2015-04-22 – 2015-04-23 (×2): 6.25 mg via ORAL
  Filled 2015-04-22 (×2): qty 1

## 2015-04-22 MED ORDER — MORPHINE SULFATE (PF) 2 MG/ML IV SOLN
2.0000 mg | INTRAVENOUS | Status: DC | PRN
  Administered 2015-04-22: 2 mg via INTRAVENOUS
  Filled 2015-04-22: qty 1

## 2015-04-22 MED ORDER — LIDOCAINE-EPINEPHRINE 1 %-1:100000 IJ SOLN
10.0000 mL | INTRAMUSCULAR | Status: DC
  Filled 2015-04-22: qty 10

## 2015-04-22 MED ORDER — ACETAMINOPHEN 325 MG PO TABS
650.0000 mg | ORAL_TABLET | Freq: Four times a day (QID) | ORAL | Status: DC | PRN
  Filled 2015-04-22: qty 2

## 2015-04-22 MED ORDER — POTASSIUM CHLORIDE IN NACL 20-0.9 MEQ/L-% IV SOLN
INTRAVENOUS | Status: DC
  Administered 2015-04-22 – 2015-04-23 (×2): via INTRAVENOUS
  Filled 2015-04-22 (×4): qty 1000

## 2015-04-22 MED ORDER — ADULT MULTIVITAMIN W/MINERALS CH
1.0000 | ORAL_TABLET | Freq: Every morning | ORAL | Status: DC
Start: 2015-04-23 — End: 2015-04-23
  Administered 2015-04-23: 1 via ORAL
  Filled 2015-04-22 (×2): qty 1

## 2015-04-22 MED ORDER — ASPIRIN 325 MG PO TABS
325.0000 mg | ORAL_TABLET | ORAL | Status: DC
  Administered 2015-04-23: 325 mg via ORAL
  Filled 2015-04-22: qty 1

## 2015-04-22 MED ORDER — CLINDAMYCIN PHOSPHATE 600 MG/50ML IV SOLN
600.0000 mg | Freq: Four times a day (QID) | INTRAVENOUS | Status: DC
  Administered 2015-04-22 – 2015-04-23 (×3): 600 mg via INTRAVENOUS
  Filled 2015-04-22 (×6): qty 50

## 2015-04-22 MED ORDER — NIACIN ER (ANTIHYPERLIPIDEMIC) 500 MG PO TBCR
1000.0000 mg | EXTENDED_RELEASE_TABLET | Freq: Every day | ORAL | Status: DC
  Administered 2015-04-22: 1000 mg via ORAL
  Filled 2015-04-22 (×3): qty 2

## 2015-04-22 MED ORDER — DOCUSATE SODIUM 100 MG PO CAPS
100.0000 mg | ORAL_CAPSULE | Freq: Two times a day (BID) | ORAL | Status: DC
Start: 2015-04-22 — End: 2015-04-23
  Administered 2015-04-22 – 2015-04-23 (×2): 100 mg via ORAL
  Filled 2015-04-22 (×2): qty 1

## 2015-04-22 MED ORDER — ATORVASTATIN CALCIUM 20 MG PO TABS: 80.0000 mg | ORAL_TABLET | Freq: Two times a day (BID) | ORAL | Status: DC

## 2015-04-22 MED ORDER — METFORMIN HCL 500 MG PO TABS
500.0000 mg | ORAL_TABLET | Freq: Two times a day (BID) | ORAL | Status: DC
  Administered 2015-04-22 – 2015-04-23 (×2): 500 mg via ORAL
  Filled 2015-04-22 (×2): qty 1

## 2015-04-22 MED ORDER — AMLODIPINE BESYLATE 5 MG PO TABS
5.0000 mg | ORAL_TABLET | Freq: Every morning | ORAL | Status: DC
  Administered 2015-04-23: 5 mg via ORAL
  Filled 2015-04-22: qty 1

## 2015-04-22 MED ORDER — IOHEXOL 300 MG/ML  SOLN
75.0000 mL | Freq: Once | INTRAMUSCULAR | Status: AC | PRN
  Administered 2015-04-22: 75 mL via INTRAVENOUS

## 2015-04-22 MED ORDER — ACETAMINOPHEN 650 MG RE SUPP: 650.0000 mg | Freq: Four times a day (QID) | RECTAL | Status: DC | PRN

## 2015-04-22 MED ORDER — NITROGLYCERIN 0.4 MG SL SUBL: 0.4000 mg | SUBLINGUAL_TABLET | SUBLINGUAL | Status: DC | PRN

## 2015-04-22 MED ORDER — HEPARIN SODIUM (PORCINE) 5000 UNIT/ML IJ SOLN
5000.0000 [IU] | Freq: Three times a day (TID) | INTRAMUSCULAR | Status: DC
  Administered 2015-04-22 – 2015-04-23 (×3): 5000 [IU] via SUBCUTANEOUS
  Filled 2015-04-22 (×3): qty 1

## 2015-04-22 MED ORDER — RAMIPRIL 1.25 MG PO CAPS
2.5000 mg | ORAL_CAPSULE | Freq: Every morning | ORAL | Status: DC
  Administered 2015-04-23: 2.5 mg via ORAL
  Filled 2015-04-22: qty 2

## 2015-04-22 MED ORDER — BISACODYL 10 MG RE SUPP: 10.0000 mg | Freq: Every day | RECTAL | Status: DC | PRN

## 2015-04-22 MED ORDER — NITROGLYCERIN 0.4 MG/SPRAY TL SOLN: 1.0000 | Status: DC | PRN

## 2015-04-22 NOTE — ED Notes (Signed)
Attempted to call report. Secretary relayed she will call me right back

## 2015-04-22 NOTE — Progress Notes (Signed)
Pt placed on CPAP [id number] - [place] issued). Tolerating well

## 2015-04-22 NOTE — Op Note (Signed)
..  04/22/2015  7:02 PM    [name]  FE:[id number]   Pre-Op Dx:  Dental abscess  Post-op Dx: same  Proc: Needle Incision and Drainage of Dental Abscess   Surg:  Audrielle Vankuren  Anes:  GOT  EBL:  1  Comp:  None  Findings:  6 cc's of purulent fluid removed with 18 gauge needle  Procedure: After verbal consent was obtained, the patients right lateral mandible was injected with 1 cc of 1% Lidocaine with 1:100,000 Epi.  After injection, an 18 gauge needle was inserted into the fluctuant tissue adjacent to the mandible.  6 cc's of purulent fluid was removed.  The patient tolerated the procedure well.  Dispo:   Return to nursing care.  Plan:  Will re-evaluate in a.m.  Cleared to advance diet as tolerated.  [name]  04/22/2015 7:02 PM

## 2015-04-22 NOTE — ED Provider Notes (Signed)
[facility] Emergency Department Provider Note REMINDER - THIS NOTE IS NOT A FINAL MEDICAL RECORD UNTIL IT IS SIGNED. UNTIL THEN, THE CONTENT BELOW MAY REFLECT INFORMATION FROM A DOCUMENTATION TEMPLATE, NOT THE ACTUAL PATIENT VISIT. ____________________________________________  Time seen: Approximately 1:40 PM  I have reviewed the triage vital signs and the nursing notes.   HISTORY  Chief Complaint Abscess    HPI Johnathen Violett is a 64 y.o. male the history of diabetes, heart disease.He presents today for concerns of increasing pain and swelling in the right jaw, associated with some slight trouble swallowing. Reports he has an infection in his lower molar, this same thing happened about 2 months ago when he is put on antibiotics it went away. However, his symptoms are now worse. He denies fevers, but states significant pain over the right jaw. Swelling.  Patient reports that sometimes difficult for him to swallow anything other than soft food and take small bites due to pain.  Past Medical History  Diagnosis Date  . 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 CAD (coronary artery disease) 04/12/2011    remote MI in 1996, 2 stents of the LAD and 1 stent in 2000 and again in 2009; moderate LV dysfunction - last cath 2/14 - managed medically   . HTN (hypertension) 04/12/2011  . Hyperlipemia 04/12/2011  . Angina   . OSA (obstructive sleep apnea)     on CPAP  . Osteoarthritis   . ED (erectile dysfunction)   . Diabetes mellitus without complication [facility])     Patient Active Problem List   Diagnosis Date Noted  . Routine general medical examination at a health care facility 11/30/2014  . Diabetes mellitus type 2, uncontrolled, without complications ([place]) 123456  . Blood in semen 09/01/2013  . Sleep disturbance 08/08/2013  . Angina at rest [facility]) 04/12/2011  . CAD (coronary artery disease) 04/12/2011  . HTN (hypertension) 04/12/2011  . Hyperlipemia  04/12/2011    Past Surgical History  Procedure Laterality Date  . Coronary stent placement      x 7  . Tonsillectomy and adenoidectomy    . Coronary angioplasty    . Left heart catheterization with coronary angiogram N/A 04/14/2011    Procedure: LEFT HEART CATHETERIZATION WITH CORONARY ANGIOGRAM;  Surgeon: Pixie Casino, MD;  Location: [facility] CATH LAB;  Service: Cardiovascular;  Laterality: N/A;  . Left heart catheterization with coronary angiogram N/A 06/25/2012    Procedure: LEFT HEART CATHETERIZATION WITH CORONARY ANGIOGRAM;  Surgeon: Peter M Martinique, MD;  Location: [facility] CATH LAB;  Service: Cardiovascular;  Laterality: N/A;    Current Outpatient Rx  Name  Route  Sig  Dispense  Refill  . amLODipine (NORVASC) 5 MG tablet      TAKE 1 TABLET EVERY MORNING   90 tablet   0     Please call our office to schedule an 6 mos appoin ...   . aspirin EC 81 MG tablet   Oral   Take 1 tablet (81 mg total) by mouth daily.   90 tablet   3   . atorvastatin (LIPITOR) 80 MG tablet      TAKE 1 TABLET DAILY AT 6 P.M   90 tablet   0     Please call our office to schedule an 6 mos appoin ...   . carvedilol (COREG) 6.25 MG tablet      TAKE 1 TABLET TWICE A DAY WITH MEALS   180 tablet  0     Please call our office to schedule an 6 mos appoin ...   . EFFIENT 10 MG TABS tablet      TAKE 1 TABLET DAILY   90 tablet   0     Please call our office to schedule an 6 mos appoin ...   . metFORMIN (GLUCOPHAGE) 500 MG tablet   Oral   Take 1 tablet (500 mg total) by mouth 2 (two) times daily with a meal.   60 tablet   0   . Multiple Vitamins-Minerals (MULTIVITAMINS THER. W/MINERALS) TABS   Oral   Take 1 tablet by mouth every morning.          . niacin (NIASPAN) 1000 MG CR tablet      TAKE 2 TABLETS (2,000 MG TOTAL) AT BEDTIME   180 tablet   0     Please call our office to schedule an 6 mos appoin ...   . nitroGLYCERIN (NITROLINGUAL) 0.4 MG/SPRAY spray   Sublingual   Place 1 spray  under the tongue every 5 (five) minutes as needed for chest pain.   12 g   12   . ramipril (ALTACE) 2.5 MG capsule   Oral   Take 1 capsule (2.5 mg total) by mouth every morning.   90 capsule   3   . RANEXA 1000 MG SR tablet      TAKE 1 TABLET EVERY 12 HOURS   180 tablet   0     Please call our office to schedule an 6 mos appoin ...   . sildenafil (VIAGRA) 100 MG tablet   Oral   Take 1 tablet (100 mg total) by mouth daily as needed for erectile dysfunction.   7 tablet   5   . zoster vaccine live, PF, (ZOSTAVAX) 91478 UNT/0.65ML injection   Subcutaneous   Inject 19,400 Units into the skin once.   1 each   0     Allergies Atarax and Latex  Family History  Problem Relation Age of Onset  . COPD Mother   . Other Father     muscle tumor  . Stroke Father   . Stroke Sister   . Prostate cancer Father     mets  . Heart disease Father     Social History Social History  Substance Use Topics  . Smoking status: Former Smoker    Types: Cigarettes    Quit date: 09/14/2007  . Smokeless tobacco: Never Used  . Alcohol Use: 0.0 oz/week    0 Standard drinks or equivalent per week     Comment: rarely    Review of Systems Constitutional: No fever/chills Eyes: No visual changes. ENT: See history of present illness Cardiovascular: Denies chest pain. Respiratory: Denies shortness of breath. Gastrointestinal: No abdominal pain.  No nausea, no vomiting.  No diarrhea.  No constipation. Genitourinary: Negative for dysuria. Musculoskeletal: Negative for back pain. Skin: Negative for rash. Neurological: Negative for headaches, focal weakness or numbness.  10-point ROS otherwise negative.  ____________________________________________   PHYSICAL EXAM:  VITAL SIGNS: ED Triage Vitals  Enc Vitals Group     BP 04/22/15 1059 142/79 mmHg     Pulse Rate 04/22/15 1059 98     Resp 04/22/15 1059 18     Temp 04/22/15 1059 98 F (36.7 C)     Temp Source 04/22/15 1059 Oral      SpO2 04/22/15 1059 97 %     Weight 04/22/15 1059 200 lb (90.719 kg)  Height 04/22/15 1059 5\' 8"  (1.727 m)     Head Cir --      Peak Flow --      Pain Score 04/22/15 1101 4     Pain Loc --      Pain Edu? --      Excl. in [place]? --    Constitutional: Alert and oriented. Well appearing and in no acute distress. Eyes: Conjunctivae are normal. PERRL. EOMI. Head: Atraumatic. Nose: No congestion/rhinnorhea. Mouth/Throat: Mucous membranes are moist.  Oropharynx non-erythematous. Approximately tooth #30 there is edema and swelling of about 2 cm around the gumline. There is no sub-lingular edema. Patient also has tenderness and induration over the right mid mandible. There is evidence of some slight redness and tracking into the submandibular space externally. He does not appear to have any obvious obstructive lesion, the airway is widely patent. There is erythema of the gumline, but no necrosis noted. Neck: No stridor.   Cardiovascular: Normal rate, regular rhythm. Grossly normal heart sounds.  Good peripheral circulation. Respiratory: Normal respiratory effort.  No retractions. Lungs CTAB. Gastrointestinal: Soft and nontender. No distention. No abdominal bruits. No CVA tenderness. Musculoskeletal: No lower extremity tenderness nor edema.  No joint effusions. Neurologic:  Normal speech and language. No gross focal neurologic deficits are appreciated.  Skin:  Skin is warm, dry and intact. No rash noted. Psychiatric: Mood and affect are normal. Speech and behavior are normal.  ____________________________________________   LABS (all labs ordered are listed, but only abnormal results are displayed)  Labs Reviewed  BASIC METABOLIC PANEL - Abnormal; Notable for the following:    Sodium 134 (*)    Chloride 99 (*)    Glucose, Bld 241 (*)    All other components within normal limits  CBC WITH DIFFERENTIAL/PLATELET    ____________________________________________  EKG   ____________________________________________  RADIOLOGY  CT Soft Tissue Neck W Contrast (Final result) Result time: 04/22/15 14:59:20   Final result by Rad Results In Interface (04/22/15 14:59:20)   Narrative:   CLINICAL DATA: 64 year old with a dental abscess involving a right mandibular tooth which was previously treated with antibiotics and improved, but recently read heard with progressively worsening pain over the past week. Patient states he was unable to obtain an appointment with his dentist for further evaluation. He presents to the ED describing jaw pain and odynophagia.  EXAM: CT NECK WITH CONTRAST  TECHNIQUE: Multidetector CT imaging of the neck was performed using the standard protocol following the bolus administration of intravenous contrast.  CONTRAST: 69mL OMNIPAQUE IOHEXOL 300 MG/ML IV.  COMPARISON: None.  FINDINGS: Lower face: Marked edema/induration involving the subcutaneous tissues of the right cheek overlying the right mandible. Periapical lucency involving the 1st molar in the right mandible (tooth # 30) with a visible tract through the anterior mandibular cortex. Complex, septated fluid collection with thickened enhancing wall immediately adjacent to the right side of the mandible in this region measuring approximately 3.5 x 2.3 x 2.7 cm.  Pharynx and larynx: Normal.  Salivary glands: Normal and symmetric bilaterally.  Thyroid: Normal.  Lymph nodes: Numerous subcentimeter short-axis reactive lymph nodes in the right level IIa and level Ia stations. No nodal masses. No pathologic lymphadenopathy elsewhere in the neck.  Vascular: Gas bubbles in the left subclavian vein related to the left upper extremity IV contrast injection. Mild calcified plaque involving the right carotid bulb. Atherosclerosis involving the visualized proximal carotid artery siphons intracranially.  Limited  intracranial: Unremarkable apart from the carotid siphon atherosclerosis.  Visualized  orbits: Intact.  Mastoids and visualized paranasal sinuses: Extensive mucosal thickening involving the nasal cavities bilaterally. Bony nasal septal deviation to the left. Opacification of scattered bilateral ethmoid air cells. Maxillary sinuses and sphenoid sinuses well aerated. Bilateral mastoid air cells and middle ear cavities well aerated.  Skeleton: Moderate disc space narrowing and endplate hypertrophic changes at C5-6 and C6-7 with borderline to mild spinal stenosis at C5-6.  Upper chest: Or are minimal atherosclerosis involving the aortic arch and the proximal great vessels. Direct origin of the left vertebral artery from the aortic arch.  IMPRESSION: 1. Dental abscess involving the right 1st mandibular molar (tooth # 30) with a visible open tract through the anterior mandibular cortex. Abscess in the subcutaneous tissues of the right cheek overlying the right mandible in this region, measured above. 2. Mild reactive lymphadenopathy involving right level IIa and level Ia nodes. No nodal masses and no pathologic lymphadenopathy elsewhere. 3. Extensive mucosal thickening involving the nasal cavities bilaterally. 4. Mild chronic bilateral ethmoid sinus disease.      ____________________________________________   PROCEDURES  Procedure(s) performed: None  Critical Care performed: No  ____________________________________________   INITIAL IMPRESSION / ASSESSMENT AND PLAN / ED COURSE  Pertinent labs & imaging results that were available during my care of the patient were reviewed by me and considered in my medical decision making (see chart for details).  Patient presents with recurrent swelling below the right mandible. Based on examination it would appear that this is most likely a notable dental abscess, though based on exam there is mild concern that it could extend out and  turned to [place] angina. In addition, considerations could also be that he may have a inflamed or necrotic lymph node, parotitis, or other possible infection including osteomyelitis of the mandible. We will obtain CT imaging to further evaluate. I discussed this case and care with Dr. Pryor Ochoa of ear nose and throat. He advises CT imaging, initiate antibiotic Clindamycin.  D/W Dr. Pryor Ochoa, will admit for IV abx and consultation by ENT. ____________________________________________   FINAL CLINICAL IMPRESSION(S) / ED DIAGNOSES  Final diagnoses:  Facial swelling  Dental abscess      Delman Kitten, MD 04/22/15 1515

## 2015-04-22 NOTE — H&P (Signed)
History and Physical    [name] [id number] DOB: [birth year] DOA: 04/22/2015  Referring physician: Dr. Jacqualine Code PCP: Rubbie Battiest, NP  Specialists:   Chief Complaint: jaw pain  HPI: [name] is a 64 y.o. male has a past medical history significant for ASCVD, DM, HTN, and OSA now with 2 week history of progressive right jaw pain. Now with progressive pain and swelling. Having difficulty swallowing. Presents to ER where he is mildly tachycardic. CT reveals a large right dental abscess with tracking. He is now admitted for further evaluation. Pt has had nausea but no vomiting or diarrhea. Denies CP or SOB.  Review of Systems: The patient denies anorexia, fever, weight loss,, vision loss, decreased hearing, hoarseness, chest pain, syncope, dyspnea on exertion, peripheral edema, balance deficits, hemoptysis, abdominal pain, melena, hematochezia, severe indigestion/heartburn, hematuria, incontinence, genital sores, muscle weakness, suspicious skin lesions, transient blindness, difficulty walking, depression, unusual weight change, abnormal bleeding, enlarged lymph nodes, angioedema, and breast masses.   Past Medical History  Diagnosis Date  . Myocardial infarction ([place])     x 5  . CAD (coronary artery disease) 04/12/2011    remote MI in 1996, 2 stents of the LAD and 1 stent in 2000 and again in 2009; moderate LV dysfunction - last cath 2/14 - managed medically   . HTN (hypertension) 04/12/2011  . Hyperlipemia 04/12/2011  . Angina   . OSA (obstructive sleep apnea)     on CPAP  . Osteoarthritis   . ED (erectile dysfunction)   . Diabetes mellitus without complication [facility])    Past Surgical History  Procedure Laterality Date  . Coronary stent placement      x 7  . Tonsillectomy and adenoidectomy    . Coronary angioplasty    . Left heart catheterization with coronary angiogram N/A 04/14/2011    Procedure: LEFT HEART CATHETERIZATION WITH CORONARY ANGIOGRAM;  Surgeon:  Pixie Casino, MD;  Location: [facility] CATH LAB;  Service: Cardiovascular;  Laterality: N/A;  . Left heart catheterization with coronary angiogram N/A 06/25/2012    Procedure: LEFT HEART CATHETERIZATION WITH CORONARY ANGIOGRAM;  Surgeon: Peter M Martinique, MD;  Location: [facility] CATH LAB;  Service: Cardiovascular;  Laterality: N/A;   Social History:  reports that he quit smoking about 7 years ago. His smoking use included Cigarettes. He has never used smokeless tobacco. He reports that he drinks alcohol. He reports that he does not use illicit drugs.  Allergies  Allergen Reactions  . Atarax [Hydroxyzine Hcl]     Weakness, out of this world feeling.  . Latex Rash    Family History  Problem Relation Age of Onset  . COPD Mother   . Other Father     muscle tumor  . Stroke Father   . Stroke Sister   . Prostate cancer Father     mets  . Heart disease Father     Prior to Admission medications   Medication Sig Start Date End Date Taking? Authorizing Provider  amLODipine (NORVASC) 5 MG tablet TAKE 1 TABLET EVERY MORNING 03/27/15   Minna Merritts, MD  aspirin EC 81 MG tablet Take 1 tablet (81 mg total) by mouth daily. 03/13/14   Minna Merritts, MD  atorvastatin (LIPITOR) 80 MG tablet TAKE 1 TABLET DAILY AT 6 P.M 03/27/15   Minna Merritts, MD  carvedilol (COREG) 6.25 MG tablet TAKE 1 TABLET TWICE A DAY WITH MEALS 03/27/15   Minna Merritts, MD  EFFIENT 10 MG TABS  tablet TAKE 1 TABLET DAILY 03/27/15   Minna Merritts, MD  metFORMIN (GLUCOPHAGE) 500 MG tablet Take 1 tablet (500 mg total) by mouth 2 (two) times daily with a meal. 03/01/15   Rubbie Battiest, NP  Multiple Vitamins-Minerals (MULTIVITAMINS THER. W/MINERALS) TABS Take 1 tablet by mouth every morning.     Historical Provider, MD  niacin (NIASPAN) 1000 MG CR tablet TAKE 2 TABLETS (2,000 MG TOTAL) AT BEDTIME 03/27/15   Minna Merritts, MD  nitroGLYCERIN (NITROLINGUAL) 0.4 MG/SPRAY spray Place 1 spray under the tongue every 5 (five) minutes as  needed for chest pain. 11/17/14   Rubbie Battiest, NP  ramipril (ALTACE) 2.5 MG capsule Take 1 capsule (2.5 mg total) by mouth every morning. 02/10/14   Minna Merritts, MD  RANEXA 1000 MG SR tablet TAKE 1 TABLET EVERY 12 HOURS 03/27/15   Minna Merritts, MD  sildenafil (VIAGRA) 100 MG tablet Take 1 tablet (100 mg total) by mouth daily as needed for erectile dysfunction. 03/14/13   Minna Merritts, MD  zoster vaccine live, PF, (ZOSTAVAX) 29562 UNT/0.65ML injection Inject 19,400 Units into the skin once. 03/13/14   Minna Merritts, MD   Physical Exam: Filed Vitals:   04/22/15 1245 04/22/15 1300 04/22/15 1315 04/22/15 1420  BP: 140/87 142/86 137/82   Pulse: 84 92 90 87  Temp:      TempSrc:      Resp: 15 22 12 15   Height:      Weight:      SpO2: 97% 98% 98% 96%     General:  No apparent distress  Eyes: PERRL, EOMI, no scleral icterus  ENT: moist oropharynx. Right jaw swelling noted with tenderness to palpation.  Neck: supple, right cervical lymphadenopathy noted.  Cardiovascular: regular rate without MRG; 2+ peripheral pulses, no JVD, no peripheral edema  Respiratory: CTA biL, good air movement without wheezing, rhonchi or crackled  Abdomen: soft, non tender to palpation, positive bowel sounds, no guarding, no rebound  Skin: no rashes  Musculoskeletal: normal bulk and tone, no joint swelling  Psychiatric: normal mood and affect  Neurologic: CN 2-12 grossly intact, MS 5/5 in all 4  Labs on Admission:  Basic Metabolic Panel:  Recent Labs Lab 04/22/15 1120  NA 134*  K 3.6  CL 99*  CO2 22  GLUCOSE 241*  BUN 17  CREATININE 0.77  CALCIUM 9.3   Liver Function Tests: No results for input(s): AST, ALT, ALKPHOS, BILITOT, PROT, ALBUMIN in the last 168 hours. No results for input(s): LIPASE, AMYLASE in the last 168 hours. No results for input(s): AMMONIA in the last 168 hours. CBC:  Recent Labs Lab 04/22/15 1120  WBC 8.2  NEUTROABS 5.7  HGB 14.9  HCT 43.1  MCV 93.5   PLT 156   Cardiac Enzymes: No results for input(s): CKTOTAL, CKMB, CKMBINDEX, TROPONINI in the last 168 hours.  BNP (last 3 results) No results for input(s): BNP in the last 8760 hours.  ProBNP (last 3 results) No results for input(s): PROBNP in the last 8760 hours.  CBG: No results for input(s): GLUCAP in the last 168 hours.  Radiological Exams on Admission: Ct Soft Tissue Neck W Contrast  04/22/2015  CLINICAL DATA:  64 year old with a dental abscess involving a right mandibular tooth which was previously treated with antibiotics and improved, but recently read heard with progressively worsening pain over the past week. Patient states he was unable to obtain an appointment with his dentist for further evaluation. He  presents to the ED describing jaw pain and odynophagia. EXAM: CT NECK WITH CONTRAST TECHNIQUE: Multidetector CT imaging of the neck was performed using the standard protocol following the bolus administration of intravenous contrast. CONTRAST:  35mL OMNIPAQUE IOHEXOL 300 MG/ML IV. COMPARISON:  None. FINDINGS: Lower face: Marked edema/induration involving the subcutaneous tissues of the right cheek overlying the right mandible. Periapical lucency involving the 1st molar in the right mandible (tooth # 30) with a visible tract through the anterior mandibular cortex. Complex, septated fluid collection with thickened enhancing wall immediately adjacent to the right side of the mandible in this region measuring approximately 3.5 x 2.3 x 2.7 cm. Pharynx and larynx: Normal. Salivary glands: Normal and symmetric bilaterally. Thyroid: Normal. Lymph nodes: Numerous subcentimeter short-axis reactive lymph nodes in the right level IIa and level Ia stations. No nodal masses. No pathologic lymphadenopathy elsewhere in the neck. Vascular: Gas bubbles in the left subclavian vein related to the left upper extremity IV contrast injection. Mild calcified plaque involving the right carotid bulb.  Atherosclerosis involving the visualized proximal carotid artery siphons intracranially. Limited intracranial: Unremarkable apart from the carotid siphon atherosclerosis. Visualized orbits: Intact. Mastoids and visualized paranasal sinuses: Extensive mucosal thickening involving the nasal cavities bilaterally. Bony nasal septal deviation to the left. Opacification of scattered bilateral ethmoid air cells. Maxillary sinuses and sphenoid sinuses well aerated. Bilateral mastoid air cells and middle ear cavities well aerated. Skeleton: Moderate disc space narrowing and endplate hypertrophic changes at C5-6 and C6-7 with borderline to mild spinal stenosis at C5-6. Upper chest: Or are minimal atherosclerosis involving the aortic arch and the proximal great vessels. Direct origin of the left vertebral artery from the aortic arch. IMPRESSION: 1. Dental abscess involving the right 1st mandibular molar (tooth # 30) with a visible open tract through the anterior mandibular cortex. Abscess in the subcutaneous tissues of the right cheek overlying the right mandible in this region, measured above. 2. Mild reactive lymphadenopathy involving right level IIa and level Ia nodes. No nodal masses and no pathologic lymphadenopathy elsewhere. 3. Extensive mucosal thickening involving the nasal cavities bilaterally. 4. Mild chronic bilateral ethmoid sinus disease. Electronically Signed   By: Evangeline Dakin M.D.   On: 04/22/2015 14:59    EKG: Independently reviewed.  Assessment/Plan Principal Problem:   Dental abscess Active Problems:   Dysphagia   Will admit to floor with IV ABX. Cultures sent. Consult ENT. Follow sugars.  Diet: clear liquid Fluids: NS with K+@100  DVT Prophylaxis: SQ Heparin  Code Status: FULL  Family Communication: none  Disposition Plan: home  Time spent: 50 min

## 2015-04-22 NOTE — ED Notes (Signed)
Patient c/o dental abscess on right lower jaw that has been present for awhile and was treated prior with antibiotics and resolved. Abscess came back and worsened this week. Patient reports unable to get in to see dentist. Patient reports difficulty swallowing anything other than soft food in small bites.

## 2015-04-22 NOTE — Consult Note (Signed)
[name], [name] [id number] 1951-02-09 Isaac Flock, MD  Reason for Consult: Facial cellulitis, Dental abscess  HPI: 64 y.o. Male with history of sore tooth that has worsened over the last 5 to 6 days.  Reports tooth has given him issues in the past and needed to take antibiotics.  This time, though much worse than before.  Patient was having difficulty swallowing and underwent CT in ER and found to have 3cm abscess adjacent to mandible.  Consult placed for management of abscess.  Patient reports feeling better after taking Clindamycin.  Allergies:  Allergies  Allergen Reactions  . Atarax [Hydroxyzine Hcl]     Weakness, out of this world feeling.  . Latex Rash    Other reaction(s): Other (See Comments) I.e. Elastic= rash to blisters if worn for an extended time Patient show effects after long exposure.    ROS: Review of systems normal other than 12 systems except per HPI.  PMH:  Past Medical History  Diagnosis Date  . Myocardial infarction ([place])     x 5  . CAD (coronary artery disease) 04/12/2011    remote MI in 1996, 2 stents of the LAD and 1 stent in 2000 and again in 2009; moderate LV dysfunction - last cath 2/14 - managed medically   . HTN (hypertension) 04/12/2011  . Hyperlipemia 04/12/2011  . Angina   . OSA (obstructive sleep apnea)     on CPAP  . Osteoarthritis   . ED (erectile dysfunction)   . Diabetes mellitus without complication (HCC)     FH:  Family History  Problem Relation Age of Onset  . COPD Mother   . Other Father     muscle tumor  . Stroke Father   . Stroke Sister   . Prostate cancer Father     mets  . Heart disease Father     SH:  Social History   Social History  . Marital Status: Legally Separated    Spouse Name: N/A  . Number of Children: 2  . Years of Education: N/A   Occupational History  . [employer]-retired   . Sales    Social History Main Topics  . Smoking status: Former Smoker    Types: Cigarettes    Quit date: 09/14/2007  .  Smokeless tobacco: Never Used  . Alcohol Use: 0.0 oz/week    0 Standard drinks or equivalent per week     Comment: rarely  . Drug Use: No     Comment: pt stopped fall 2012  . Sexual Activity: No   Other Topics Concern  . Not on file   Social History Narrative   Retired twice    Working with rescue dogs    Lives with girlfriend   Pet: 1 snake ([place] snake), 3 dogs    Caffeine- coffee 3 cups a day, tea- unsweet     PSH:  Past Surgical History  Procedure Laterality Date  . Coronary stent placement      x 7  . Tonsillectomy and adenoidectomy    . Coronary angioplasty    . Left heart catheterization with coronary angiogram N/A 04/14/2011    Procedure: LEFT HEART CATHETERIZATION WITH CORONARY ANGIOGRAM;  Surgeon: Pixie Casino, MD;  Location: [facility] CATH LAB;  Service: Cardiovascular;  Laterality: N/A;  . Left heart catheterization with coronary angiogram N/A 06/25/2012    Procedure: LEFT HEART CATHETERIZATION WITH CORONARY ANGIOGRAM;  Surgeon: Peter M Martinique, MD;  Location: [facility] CATH LAB;  Service: Cardiovascular;  Laterality: N/A;  Physical  Exam:  GEN-  CN 2-12 grossly intact and symmetric EARS- external normal BL OC/OP-  Significant edema and widening of mandible on right with area of fluctuance overlying the avelous,  Poor dentition NECK- Right lymphadenopathy NOSE- left septal deviation, mucosal edema  CT scan- 1st molar abscess of right mandible, ethmoid sinusitis, cellulitis  A/P: Dental abscess with facial cellulitis  Plan:  Discussed findings with patient.  Will attempt needle aspiration of abscess cavity.  Agree with clindamycin.  Discussed with patient that will need dental extraction once infection improved.   VAUGHT,CREIGHTON 04/22/2015 6:42 PM

## 2015-04-23 LAB — COMPREHENSIVE METABOLIC PANEL
ALT: 23 U/L (ref 17–63)
AST: 17 U/L (ref 15–41)
Albumin: 3.4 g/dL — ABNORMAL LOW (ref 3.5–5.0)
Alkaline Phosphatase: 60 U/L (ref 38–126)
Anion gap: 11 (ref 5–15)
BILIRUBIN TOTAL: 2.3 mg/dL — AB (ref 0.3–1.2)
BUN: 13 mg/dL (ref 6–20)
CO2: 20 mmol/L — ABNORMAL LOW (ref 22–32)
CREATININE: 0.93 mg/dL (ref 0.61–1.24)
Calcium: 8.7 mg/dL — ABNORMAL LOW (ref 8.9–10.3)
Chloride: 105 mmol/L (ref 101–111)
Glucose, Bld: 213 mg/dL — ABNORMAL HIGH (ref 65–99)
POTASSIUM: 3.7 mmol/L (ref 3.5–5.1)
Sodium: 136 mmol/L (ref 135–145)
TOTAL PROTEIN: 6.1 g/dL — AB (ref 6.5–8.1)

## 2015-04-23 LAB — CBC
HEMATOCRIT: 40.3 % (ref 40.0–52.0)
Hemoglobin: 14.2 g/dL (ref 13.0–18.0)
MCH: 32.9 pg (ref 26.0–34.0)
MCHC: 35.1 g/dL (ref 32.0–36.0)
MCV: 93.7 fL (ref 80.0–100.0)
Platelets: 149 10*3/uL — ABNORMAL LOW (ref 150–440)
RBC: 4.31 MIL/uL — ABNORMAL LOW (ref 4.40–5.90)
RDW: 12.9 % (ref 11.5–14.5)
WBC: 6.4 10*3/uL (ref 3.8–10.6)

## 2015-04-23 LAB — GLUCOSE, CAPILLARY
GLUCOSE-CAPILLARY: 190 mg/dL — AB (ref 65–99)
Glucose-Capillary: 282 mg/dL — ABNORMAL HIGH (ref 65–99)

## 2015-04-23 MED ORDER — CLINDAMYCIN HCL 300 MG PO CAPS: 300.0000 mg | ORAL_CAPSULE | Freq: Three times a day (TID) | ORAL | Status: DC

## 2015-04-23 MED ORDER — ACIDOPHILUS PROBIOTIC 100 MG PO CAPS: 100.0000 mg | ORAL_CAPSULE | Freq: Every day | ORAL | Status: DC

## 2015-04-23 MED ORDER — CLINDAMYCIN HCL 150 MG PO CAPS
300.0000 mg | ORAL_CAPSULE | Freq: Three times a day (TID) | ORAL | Status: DC
  Administered 2015-04-23: 300 mg via ORAL
  Filled 2015-04-23: qty 2

## 2015-04-23 NOTE — Progress Notes (Signed)
Instructions reviewed with the patient and his girlfriend.  Pt has rx for probiotic, clindamycin, and metformin.  Volunteer will push patient to the ER entrance.  Pt is allowed to drive.  He has not has any narcotics since 10:00pm last night

## 2015-04-23 NOTE — Progress Notes (Signed)
Heparin SQ and aspirin 

## 2015-04-23 NOTE — Discharge Summary (Addendum)
[facility] [place] NAME: [name]    MR#:  [id number]  DATE OF BIRTH:  1950-11-24  DATE OF ADMISSION:  04/22/2015 ADMITTING PHYSICIAN: Idelle Crouch, MD  DATE OF DISCHARGE: 04/23/2015 PRIMARY CARE PHYSICIAN: Rubbie Battiest, NP    ADMISSION DIAGNOSIS:  Dental abscess [K04.7] Facial swelling [R22.0]  DISCHARGE DIAGNOSIS:  Principal Problem:   Dental abscess Active Problems:   Dysphagia   SECONDARY DIAGNOSIS:   Past Medical History  Diagnosis Date  . Myocardial infarction ([place])     x 5  . CAD (coronary artery disease) 04/12/2011    remote MI in 1996, 2 stents of the LAD and 1 stent in 2000 and again in 2009; moderate LV dysfunction - last cath 2/14 - managed medically   . HTN (hypertension) 04/12/2011  . Hyperlipemia 04/12/2011  . Angina   . OSA (obstructive sleep apnea)     on CPAP  . Osteoarthritis   . ED (erectile dysfunction)   . Diabetes mellitus without complication [facility])     HOSPITAL COURSE:   64 y/o male with HTN and CAD presented with dental abscess. Please see H and P.  1. Dental abscess: This was drained by ENT. He was started on IV CLINDAMYCIN. He has no signs of respiratory compromise. He needs to see a dentist for dental extraction after course of antibiotics completed.  2. CAD: Continue home medications.  3. Type 2 diabetes without complication: Blood sugars slightly elevated while in the hospital due to infection. Patient will follow up with PCP. He is currently maintained on metformin and diet.  4. Essential HTN: Patient should continue home medications.   DISCHARGE CONDITIONS AND DIET:   Discharge home stable condition on ADA diet  CONSULTS OBTAINED:  Treatment Team:  Carloyn Manner, MD  DRUG ALLERGIES:   Allergies  Allergen Reactions  . Atarax [Hydroxyzine Hcl]     Weakness, out of this world feeling.  . Latex Rash    Other reaction(s): Other (See Comments) I.e. Elastic=  rash to blisters if worn for an extended time Patient show effects after long exposure.    DISCHARGE MEDICATIONS:   Current Discharge Medication List    START taking these medications   Details  clindamycin (CLEOCIN) 300 MG capsule Take 1 capsule (300 mg total) by mouth every 8 (eight) hours. Qty: 30 capsule, Refills: 0    Lactobacillus (ACIDOPHILUS PROBIOTIC) 100 MG CAPS Take 1 capsule (100 mg total) by mouth daily. Qty: 15 capsule, Refills: 0      CONTINUE these medications which have NOT CHANGED   Details  amLODipine (NORVASC) 5 MG tablet TAKE 1 TABLET EVERY MORNING Qty: 90 tablet, Refills: 0    aspirin 325 MG tablet Take 325 mg by mouth every morning.    atorvastatin (LIPITOR) 80 MG tablet TAKE 1 TABLET DAILY AT 6 P.M Qty: 90 tablet, Refills: 0    carvedilol (COREG) 6.25 MG tablet TAKE 1 TABLET TWICE A DAY WITH MEALS Qty: 180 tablet, Refills: 0    EFFIENT 10 MG TABS tablet TAKE 1 TABLET DAILY Qty: 90 tablet, Refills: 0    metFORMIN (GLUCOPHAGE) 500 MG tablet Take 1 tablet (500 mg total) by mouth 2 (two) times daily with a meal. Qty: 60 tablet, Refills: 0    Multiple Vitamins-Minerals (MULTIVITAMINS THER. W/MINERALS) TABS Take 1 tablet by mouth every morning.     niacin (NIASPAN) 1000 MG CR tablet TAKE 2 TABLETS (2,000 MG TOTAL) AT BEDTIME  Qty: 180 tablet, Refills: 0    nitroGLYCERIN (NITROLINGUAL) 0.4 MG/SPRAY spray Place 1 spray under the tongue every 5 (five) minutes as needed for chest pain. Qty: 12 g, Refills: 12    ramipril (ALTACE) 2.5 MG capsule Take 1 capsule (2.5 mg total) by mouth every morning. Qty: 90 capsule, Refills: 3    RANEXA 1000 MG SR tablet TAKE 1 TABLET EVERY 12 HOURS Qty: 180 tablet, Refills: 0    sildenafil (VIAGRA) 100 MG tablet Take 1 tablet (100 mg total) by mouth daily as needed for erectile dysfunction. Qty: 7 tablet, Refills: 5    zoster vaccine live, PF, (ZOSTAVAX) 16109 UNT/0.65ML injection Inject 19,400 Units into the skin  once. Qty: 1 each, Refills: 0              Today   CHIEF COMPLAINT:  Doing well this am no SOB swallowing fine. Wants a regular diet he is hungry   VITAL SIGNS:  Blood pressure 127/71, pulse 93, temperature 98.1 F (36.7 C), temperature source Oral, resp. rate 18, height 5\' 8"  (1.727 m), weight 90.719 kg (200 lb), SpO2 98 %.   REVIEW OF SYSTEMS:  Review of Systems  Constitutional: Negative for fever, chills and malaise/fatigue.  HENT: Negative for sore throat.        Still with some soreness of jaw and swelling  Eyes: Negative for blurred vision.  Respiratory: Negative for cough, hemoptysis, shortness of breath and wheezing.   Cardiovascular: Negative for chest pain, palpitations and leg swelling.  Gastrointestinal: Negative for nausea, vomiting, abdominal pain, diarrhea and blood in stool.  Genitourinary: Negative for dysuria.  Musculoskeletal: Negative for back pain.  Neurological: Negative for dizziness, tremors and headaches.  Endo/Heme/Allergies: Does not bruise/bleed easily.     PHYSICAL EXAMINATION:  GENERAL:  64 y.o.-year-old patient lying in the bed with no acute distress.  NECK:  Supple, no jugular venous distention. No thyroid enlargement,   Some swelling and mild tenderness right jaw LUNGS: Normal breath sounds bilaterally, no wheezing, rales,rhonchi  No use of accessory muscles of respiration.  CARDIOVASCULAR: S1, S2 normal. No murmurs, rubs, or gallops.  ABDOMEN: Soft, non-tender, non-distended. Bowel sounds present. No organomegaly or mass.  EXTREMITIES: No pedal edema, cyanosis, or clubbing.  PSYCHIATRIC: The patient is alert and oriented x 3.  SKIN: No obvious rash, lesion, or ulcer.   DATA REVIEW:   CBC  Recent Labs Lab 04/23/15 0355  WBC 6.4  HGB 14.2  HCT 40.3  PLT 149*    Chemistries   Recent Labs Lab 04/23/15 0355  NA 136  K 3.7  CL 105  CO2 20*  GLUCOSE 213*  BUN 13  CREATININE 0.93  CALCIUM 8.7*  AST 17  ALT 23   ALKPHOS 60  BILITOT 2.3*    Cardiac Enzymes No results for input(s): TROPONINI in the last 168 hours.  Microbiology Results  @[email or web address] @  RADIOLOGY:  Ct Soft Tissue Neck W Contrast  04/22/2015  CLINICAL DATA:  64 year old with a dental abscess involving a right mandibular tooth which was previously treated with antibiotics and improved, but recently read heard with progressively worsening pain over the past week. Patient states he was unable to obtain an appointment with his dentist for further evaluation. He presents to the ED describing jaw pain and odynophagia. EXAM: CT NECK WITH CONTRAST TECHNIQUE: Multidetector CT imaging of the neck was performed using the standard protocol following the bolus administration of intravenous contrast. CONTRAST:  41mL OMNIPAQUE IOHEXOL 300 MG/ML IV. COMPARISON:  None. FINDINGS: Lower face: Marked edema/induration involving the subcutaneous tissues of the right cheek overlying the right mandible. Periapical lucency involving the 1st molar in the right mandible (tooth # 30) with a visible tract through the anterior mandibular cortex. Complex, septated fluid collection with thickened enhancing wall immediately adjacent to the right side of the mandible in this region measuring approximately 3.5 x 2.3 x 2.7 cm. Pharynx and larynx: Normal. Salivary glands: Normal and symmetric bilaterally. Thyroid: Normal. Lymph nodes: Numerous subcentimeter short-axis reactive lymph nodes in the right level IIa and level Ia stations. No nodal masses. No pathologic lymphadenopathy elsewhere in the neck. Vascular: Gas bubbles in the left subclavian vein related to the left upper extremity IV contrast injection. Mild calcified plaque involving the right carotid bulb. Atherosclerosis involving the visualized proximal carotid artery siphons intracranially. Limited intracranial: Unremarkable apart from the carotid siphon atherosclerosis. Visualized orbits: Intact. Mastoids and visualized  paranasal sinuses: Extensive mucosal thickening involving the nasal cavities bilaterally. Bony nasal septal deviation to the left. Opacification of scattered bilateral ethmoid air cells. Maxillary sinuses and sphenoid sinuses well aerated. Bilateral mastoid air cells and middle ear cavities well aerated. Skeleton: Moderate disc space narrowing and endplate hypertrophic changes at C5-6 and C6-7 with borderline to mild spinal stenosis at C5-6. Upper chest: Or are minimal atherosclerosis involving the aortic arch and the proximal great vessels. Direct origin of the left vertebral artery from the aortic arch. IMPRESSION: 1. Dental abscess involving the right 1st mandibular molar (tooth # 30) with a visible open tract through the anterior mandibular cortex. Abscess in the subcutaneous tissues of the right cheek overlying the right mandible in this region, measured above. 2. Mild reactive lymphadenopathy involving right level IIa and level Ia nodes. No nodal masses and no pathologic lymphadenopathy elsewhere. 3. Extensive mucosal thickening involving the nasal cavities bilaterally. 4. Mild chronic bilateral ethmoid sinus disease. Electronically Signed   By: Evangeline Dakin M.D.   On: 04/22/2015 14:59      Management plans discussed with the patient and he is in agreement. Stable for discharge home  Patient should follow up with PCP in 1 week DENTIST 2 weeks  CODE STATUS:     Code Status Orders        Start     Ordered   04/22/15 1723  Full code   Continuous     04/22/15 1722      TOTAL TIME TAKING CARE OF THIS PATIENT: 35 minutes.    Note: This dictation was prepared with Dragon dictation along with smaller phrase technology. Any transcriptional errors that result from this process are unintentional.  Draedyn Weidinger M.D on 04/23/2015 at 8:12 AM  Between 7am to 6pm - Pager - [phone] After 6pm go to [email or web address] - password [email or web address] [facility]  Office   [phone]  CC: Primary care physician; Rubbie Battiest, NP

## 2015-04-25 ENCOUNTER — Telehealth: Payer: Self-pay

## 2015-04-25 NOTE — Telephone Encounter (Signed)
Transition Care Management Follow-up Telephone Call   Date discharged? 04/23/15   How have you been since you were released from the hospital? Facial swelling is going down just a little ,but still a little tender to the touch.  Appetite is good but eating slower and softer foods.  Denies SOB and is swallowing fine.  No temperature.   Do you understand why you were in the hospital? Yes, infection in my mouth with the abscess.                                                     Do you understand the discharge instructions? Yes, no problems with the instructions.        Where were you discharged to? Home   Items Reviewed:  Medications reviewed: Yes, started Clindamycin and Lactobacillus with no problems. Continuing all other scheduled medications as prescribed.  Allergies reviewed: Yes, no changes  Dietary changes reviewed: Yes, ADA diet, no problems  Referrals reviewed: Yes, appointment made with dentist as per him.   Functional Questionnaire:   Activities of Daily Living (ADLs):   He states they are independent in the following: Independent in all ADLs. States they require assistance with the following: Does not require assistance at this time.     Any transportation issues/concerns?  None.        Any patient concerns? A1C check.   Confirmed importance and date/time of follow-up visits scheduled:  Yes, appointment made 04/30/15 at 2:00.     Provider Appointment booked with Lorane Gell, NP (PCP).  Confirmed with patient if condition begins to worsen call PCP or go to the ER.  Patient was given the office number and encouraged to call back with question or concerns.  : Yes, patient verbalized understanding.

## 2015-04-30 ENCOUNTER — Ambulatory Visit (INDEPENDENT_AMBULATORY_CARE_PROVIDER_SITE_OTHER): Admitting: Nurse Practitioner

## 2015-04-30 VITALS — BP 130/78 | HR 93 | Temp 97.8°F | Resp 16 | Ht 68.0 in | Wt 194.0 lb

## 2015-04-30 DIAGNOSIS — R1310 Dysphagia, unspecified: Secondary | ICD-10-CM | POA: Diagnosis not present

## 2015-04-30 DIAGNOSIS — K047 Periapical abscess without sinus: Secondary | ICD-10-CM | POA: Diagnosis not present

## 2015-04-30 MED ORDER — METFORMIN HCL 1000 MG PO TABS: 1000.0000 mg | ORAL_TABLET | Freq: Two times a day (BID) | ORAL | Status: DC

## 2015-04-30 NOTE — Patient Instructions (Addendum)
Good to see you!   Keep trying to find a dentist.   Take two tablets in the morning and two at night with dinner. I will send one script to [facility] and 1 to [facility].   Follow up in 3 months.

## 2015-04-30 NOTE — Progress Notes (Addendum)
Patient ID: [name], male    DOB: Dec 16, 1950  Age: 64 y.o. MRN: [id number]  CC: Oral Swelling   HPI [name] presents for TCM follow up from dental abscess.   1) Patient was admitted to [facility] on 04/22/2015 and discharged home 04/23/15.  Discharge diagnosis was dental abscess with dysphasia This was drained by ENT and he was started on IV clindamycin **I have reviewed hospital records and discharge summary personally.  2) Pt reports much less swelling  Spent 2 days trying to find a dentist and went to the ER   2) A1c in October was 11.5 repeated in December was 11 Taking 500 mg tablets of Metformin twice daily    History [name] has a past medical history of Myocardial infarction [facility]); CAD (coronary artery disease) (04/12/2011); HTN (hypertension) (04/12/2011); Hyperlipemia (04/12/2011); Angina; OSA (obstructive sleep apnea); Osteoarthritis; ED (erectile dysfunction); and Diabetes mellitus without complication ([place]).   He has past surgical history that includes Coronary stent placement; Tonsillectomy and adenoidectomy; Coronary angioplasty; left heart catheterization with coronary angiogram (N/A, 04/14/2011); and left heart catheterization with coronary angiogram (N/A, 06/25/2012).   His family history includes COPD in his mother; Heart disease in his father; Other in his father; Prostate cancer in his father; Stroke in his father and sister.He reports that he quit smoking about 7 years ago. His smoking use included Cigarettes. He has never used smokeless tobacco. He reports that he drinks alcohol. He reports that he does not use illicit drugs.  Outpatient Prescriptions Prior to Visit  Medication Sig Dispense Refill  . aspirin 325 MG tablet Take 325 mg by mouth every morning.    Marland Kitchen atorvastatin (LIPITOR) 80 MG tablet TAKE 1 TABLET DAILY AT 6 P.M (Patient taking differently: TAKE 1 TABLET ORALLY TWICE DAILY.) 90 tablet 0  . carvedilol (COREG) 6.25 MG tablet TAKE 1 TABLET  TWICE A DAY WITH MEALS 180 tablet 0  . clindamycin (CLEOCIN) 300 MG capsule Take 1 capsule (300 mg total) by mouth every 8 (eight) hours. 30 capsule 0  . EFFIENT 10 MG TABS tablet TAKE 1 TABLET DAILY 90 tablet 0  . Lactobacillus (ACIDOPHILUS PROBIOTIC) 100 MG CAPS Take 1 capsule (100 mg total) by mouth daily. 15 capsule 0  . Multiple Vitamins-Minerals (MULTIVITAMINS THER. W/MINERALS) TABS Take 1 tablet by mouth every morning.     . niacin (NIASPAN) 1000 MG CR tablet TAKE 2 TABLETS (2,000 MG TOTAL) AT BEDTIME 180 tablet 0  . nitroGLYCERIN (NITROLINGUAL) 0.4 MG/SPRAY spray Place 1 spray under the tongue every 5 (five) minutes as needed for chest pain. 12 g 12  . ramipril (ALTACE) 2.5 MG capsule Take 1 capsule (2.5 mg total) by mouth every morning. 90 capsule 3  . RANEXA 1000 MG SR tablet TAKE 1 TABLET EVERY 12 HOURS 180 tablet 0  . sildenafil (VIAGRA) 100 MG tablet Take 1 tablet (100 mg total) by mouth daily as needed for erectile dysfunction. 7 tablet 5  . zoster vaccine live, PF, (ZOSTAVAX) 16109 UNT/0.65ML injection Inject 19,400 Units into the skin once. 1 each 0  . metFORMIN (GLUCOPHAGE) 500 MG tablet Take 1 tablet (500 mg total) by mouth 2 (two) times daily with a meal. 60 tablet 0  . amLODipine (NORVASC) 5 MG tablet TAKE 1 TABLET EVERY MORNING 90 tablet 0   No facility-administered medications prior to visit.   ROS Review of Systems  Constitutional: Negative for fever, chills, diaphoresis and fatigue.  Eyes: Negative for visual disturbance.  Respiratory: Negative for  chest tightness, shortness of breath and wheezing.   Cardiovascular: Negative for chest pain, palpitations and leg swelling.  Gastrointestinal: Negative for nausea, vomiting and diarrhea.  Endocrine: Negative for polydipsia, polyphagia and polyuria.  Skin: Negative for rash.  Neurological: Negative for dizziness, weakness and numbness.  Psychiatric/Behavioral: The patient is not nervous/anxious.     Objective:  BP  130/78 mmHg  Pulse 93  Temp(Src) 97.8 F (36.6 C)  Resp 16  Ht 5\' 8"  (1.727 m)  Wt 194 lb (87.998 kg)  BMI 29.50 kg/m2  SpO2 96%  Physical Exam  Constitutional: He is oriented to person, place, and time. He appears well-developed and well-nourished. No distress.  HENT:  Head: Normocephalic and atraumatic.  Right Ear: External ear normal.  Left Ear: External ear normal.  Mouth/Throat: No oropharyngeal exudate.  Neck: Normal range of motion. Neck supple. 7 Cardiovascular: Normal rate, regular rhythm and normal heart sounds.  Exam reveals no gallop and no friction rub.   No murmur heard. Pulmonary/Chest: Effort normal and breath sounds normal. No respiratory distress. He has no wheezes. He has no rales. He exhibits no tenderness.  Lymphadenopathy:    He has no cervical adenopathy.  Neurological: He is alert and oriented to person, place, and time.  Skin: Skin is warm and dry. No rash noted. He is not diaphoretic.  Psychiatric: He has a normal mood and affect. His behavior is normal. Judgment and thought content normal.   Assessment & Plan:   Aras was seen today for oral swelling.  Diagnoses and all orders for this visit:  Dental abscess  Dysphagia  Other orders -     Discontinue: metFORMIN (GLUCOPHAGE) 1000 MG tablet; Take 1 tablet (1,000 mg total) by mouth 2 (two) times daily with a meal. -     metFORMIN (GLUCOPHAGE) 1000 MG tablet; Take 1 tablet (1,000 mg total) by mouth 2 (two) times daily with a meal.   I am having [name] maintain his multivitamins ther. w/minerals, sildenafil, ramipril, zoster vaccine live (PF), nitroGLYCERIN, niacin, carvedilol, EFFIENT, RANEXA, amLODipine, atorvastatin, aspirin, clindamycin, ACIDOPHILUS PROBIOTIC, and metFORMIN.  Meds ordered this encounter  Medications  . DISCONTD: metFORMIN (GLUCOPHAGE) 1000 MG tablet    Sig: Take 1 tablet (1,000 mg total) by mouth 2 (two) times daily with a meal.    Dispense:  180 tablet    Refill:  1     Order Specific Question:  Supervising Provider    Answer:  Deborra Medina L [[id number]]  . metFORMIN (GLUCOPHAGE) 1000 MG tablet    Sig: Take 1 tablet (1,000 mg total) by mouth 2 (two) times daily with a meal.    Dispense:  60 tablet    Refill:  0    Order Specific Question:  Supervising Provider    Answer:  Crecencio Mc [[id number]]     Follow-up: Return in about 3 months (around 07/29/2015) for Repeat of A1c and DM visit .

## 2015-05-07 ENCOUNTER — Other Ambulatory Visit: Payer: Self-pay | Admitting: Cardiovascular Disease

## 2015-05-07 ENCOUNTER — Encounter: Payer: Self-pay | Admitting: Nurse Practitioner

## 2015-05-07 NOTE — Assessment & Plan Note (Addendum)
Improved. Pt still trying to find a dentist who takes new patients and takes his type of insurance- very difficult. Advised him to keep trying and call his insurance for help. Upped Metformin to 1000 mg twice daily with meals. FU in 3 months for re-check of A1c  I have personally reviewed Hospital records and discharge summary.

## 2015-05-07 NOTE — Assessment & Plan Note (Signed)
Improved. Will follow as needed

## 2015-05-21 ENCOUNTER — Encounter: Payer: Self-pay | Admitting: Physician Assistant

## 2015-05-21 DIAGNOSIS — I255 Ischemic cardiomyopathy: Secondary | ICD-10-CM | POA: Insufficient documentation

## 2015-05-22 ENCOUNTER — Ambulatory Visit: Admitting: Nurse Practitioner

## 2015-05-23 ENCOUNTER — Ambulatory Visit: Admitting: Physician Assistant

## 2015-05-23 ENCOUNTER — Encounter: Payer: Self-pay | Admitting: *Deleted

## 2015-06-03 NOTE — Progress Notes (Signed)
Cardiology Office Note Date:  06/04/2015  Patient ID:  Zale, Boeke 1950/06/14, MRN [id number] PCP:  Rubbie Battiest, NP  Cardiologist:  Dr. Rockey Situ, MD    Chief Complaint: 6 month follow up, doing well  History of Present Illness: Rhydian Zepp is a 65 y.o. male with history of CAD s/p multiple MI and PCI as below, also with history of ischemic cardiomyopathy with EF of 40% on LV gram from cardiac cath 06/2012, HTN, HLD, obesity, and OSA who presents for routine follow up of the above.   Patient suffered his first MI in 51. He suffered another MI in 1997 s/p stenting to the LAD and diagonal branches in [place], [place]. In 2009, he had a third MI, with critical stenosis of the proximal LAD s/p long stenting to the mid LAD and diagonal branch. He also had POBA of the jailed branch. He underwent repeat cardiac cath on 07/05/2008 in the setting of an abnormal Myoview that showed a normal LM, patent stents along LAD. However, there was severe 80% ISR along an old stent which was described as long. There were faint left to right collaterals. Distal LCx with 40-50% stenosis, proximal RCA 50-60% stenosis, mid RCA 30-40% stenosis, and an EF of 35-40% with mild global hypokinesis and mid-to-distal anterior, anterolateral, and apical akinesis. The above ischemia improve with administration of nitroglycerin during the procedure. Continuing medical therapy was recommended. Repeat cardiac cath 04/14/2011 for unstable angina that showed LM normal, approximately 50-60% tubular mid LAD ISR, 60-70% distal LAD stenosis, ostial RCA 50% stenosis, and an EF of 45-50%. He underwent FFR of the mid LAD by Dr. Ellyn Hack that showed a reading of 0.75, hemodynamically significant. Thus he underwent cutting balloon PCI/DES complicated by dissection leading to two further overlapping stents for repair for a total of 3. He was advised to remain on life long DAPT. He again presented with chest pain in 06/2012. Repeat  cardiac cath did not show any progression of his coronary disease, with single vessel obstructive CAD involving the distal LAD. This was unchanged from his prior study in 04/2011. The extensive stents in the proximal to mid LAD and first diagonal branch were still patent. EF was estimated at 40%. Medical management was recommended.  He is doing well today. No chest pain since his lat office visit with Dr. Rockey Situ in June. He is able to go up 2 flights of stairs without any SOB. He denies any lower extremity edema, orthopnea, PND, increased cough, or early satiety. He is taking all of his medications without issues. He does not check his BP, weight, or blood sugar at home. He is interested in stopping his Ranexa at this time. He has not had to take any SL NTG in greater than one year. He uses his CPAP every night and when he takes a nap. He does not have any concerns at this time.     Past Medical History  Diagnosis Date  . Myocardial infarction ([place])     x 5  . CAD (coronary artery disease) 04/12/2011    a. remote MI in 1996; b. MI 1997 s/p stenting to LAD & diag; c. MI 2009: long stenting from pLAD to mid LAD and diag & POBA of jailed diag branch; d. cath 2010: 80% ISR of LAD w/ L-R collats, med Rx; e. cath 2012: 50-60% tub mLAD, 60-70% dLAD, FFR 0.75 -->s/p PCI dissection repaired w/ 2 stents (3 total); f. cath 06/2012: no changes from 2012  cath, med Rx, patent stents   . HTN (hypertension) 04/12/2011  . Hyperlipemia 04/12/2011  . Angina   . OSA (obstructive sleep apnea)     on CPAP  . Osteoarthritis   . ED (erectile dysfunction)   . Diabetes mellitus without complication ([place])   . Ischemic cardiomyopathy     a. echo 2010: EF 45-50%, mild to mod ant and apical wall HK, trace MR; b. cardiac cath 06/2012: EF 40%, mild MR   . Obesity     Past Surgical History  Procedure Laterality Date  . Coronary stent placement      x 7  . Tonsillectomy and adenoidectomy    . Coronary angioplasty    . Left  heart catheterization with coronary angiogram N/A 04/14/2011    Procedure: LEFT HEART CATHETERIZATION WITH CORONARY ANGIOGRAM;  Surgeon: Pixie Casino, MD;  Location: [facility] CATH LAB;  Service: Cardiovascular;  Laterality: N/A;  . Left heart catheterization with coronary angiogram N/A 06/25/2012    Procedure: LEFT HEART CATHETERIZATION WITH CORONARY ANGIOGRAM;  Surgeon: Peter M Martinique, MD;  Location: [facility] CATH LAB;  Service: Cardiovascular;  Laterality: N/A;    Current Outpatient Prescriptions  Medication Sig Dispense Refill  . amLODipine (NORVASC) 5 MG tablet TAKE 1 TABLET EVERY MORNING 90 tablet 0  . aspirin 325 MG tablet Take 325 mg by mouth every morning.    Marland Kitchen atorvastatin (LIPITOR) 80 MG tablet TAKE 1 TABLET DAILY AT 6 P.M (Patient taking differently: TAKE 1 TABLET ORALLY TWICE DAILY.) 90 tablet 0  . carvedilol (COREG) 6.25 MG tablet TAKE 1 TABLET TWICE A DAY WITH MEALS 180 tablet 0  . EFFIENT 10 MG TABS tablet TAKE 1 TABLET DAILY 90 tablet 0  . Lactobacillus (ACIDOPHILUS PROBIOTIC) 100 MG CAPS Take 1 capsule (100 mg total) by mouth daily. 15 capsule 0  . metFORMIN (GLUCOPHAGE) 1000 MG tablet Take 1 tablet (1,000 mg total) by mouth 2 (two) times daily with a meal. 60 tablet 0  . Multiple Vitamins-Minerals (MULTIVITAMINS THER. W/MINERALS) TABS Take 1 tablet by mouth every morning.     . niacin (NIASPAN) 1000 MG CR tablet TAKE 2 TABLETS (2,000 MG TOTAL) AT BEDTIME 180 tablet 0  . nitroGLYCERIN (NITROLINGUAL) 0.4 MG/SPRAY spray Place 1 spray under the tongue every 5 (five) minutes as needed for chest pain. 12 g 12  . ramipril (ALTACE) 2.5 MG capsule TAKE 1 CAPSULE EVERY MORNING 90 capsule 3  . RANEXA 1000 MG SR tablet TAKE 1 TABLET EVERY 12 HOURS 180 tablet 0  . sildenafil (VIAGRA) 100 MG tablet Take 1 tablet (100 mg total) by mouth daily as needed for erectile dysfunction. 7 tablet 5  . zoster vaccine live, PF, (ZOSTAVAX) 16109 UNT/0.65ML injection Inject 19,400 Units into the skin once. 1 each 0     No current facility-administered medications for this visit.    Allergies:   Atarax and Latex   Social History:  The patient  reports that he quit smoking about 7 years ago. His smoking use included Cigarettes. He has never used smokeless tobacco. He reports that he drinks alcohol. He reports that he does not use illicit drugs.   Family History:  The patient's family history includes COPD in his mother; Heart disease in his father; Other in his father; Prostate cancer in his father; Stroke in his father and sister.  ROS:   Review of Systems  Constitutional: Positive for weight loss. Negative for fever, chills, malaise/fatigue and diaphoresis.  HENT: Negative for congestion.  Tooth pain - improving, still looking for a DDS  Eyes: Negative for discharge and redness.  Respiratory: Negative for cough, hemoptysis, sputum production, shortness of breath and wheezing.   Cardiovascular: Negative for chest pain, palpitations, orthopnea, claudication, leg swelling and PND.  Gastrointestinal: Negative for nausea, vomiting and abdominal pain.  Musculoskeletal: Negative for falls.  Skin: Negative for rash.  Neurological: Negative for dizziness, sensory change, speech change, focal weakness, loss of consciousness and weakness.  Endo/Heme/Allergies: Does not bruise/bleed easily.  Psychiatric/Behavioral: The patient is not nervous/anxious.   All other systems reviewed and are negative.     PHYSICAL EXAM:  VS:  BP 122/80 mmHg  Pulse 87  Ht 5\' 8"  (1.727 m)  Wt 194 lb 12 oz (88.338 kg)  BMI 29.62 kg/m2 BMI: Body mass index is 29.62 kg/(m^2). Well nourished, well developed, in no acute distress HEENT: normocephalic, atraumatic Neck: no JVD, carotid bruits or masses Cardiac:  normal S1, S2; RRR; no murmurs, rubs, or gallops Lungs:  clear to auscultation bilaterally, no wheezing, rhonchi or rales Abd: soft, nontender, no hepatomegaly, + BS MS: no deformity or atrophy Ext: no edema Skin:  warm and dry, no rash Neuro:  moves all extremities spontaneously, no focal abnormalities noted, follows commands Psych: euthymic mood, full affect   EKG:  Was ordered today. Shows NSR, 87 bpm, inferior Q waves, possible old anteroseptal infarct, no significant st/t changes  Recent Labs: 11/22/2014: TSH 0.919 04/23/2015: ALT 23; BUN 13; Creatinine, Ser 0.93; Hemoglobin 14.2; Platelets 149*; Potassium 3.7; Sodium 136  11/22/2014: Cholesterol, Total 143; HDL 50; LDL Calculated 79; Triglycerides 68   CrCl cannot be calculated (Patient has no serum creatinine result on file.).   Wt Readings from Last 3 Encounters:  06/04/15 194 lb 12 oz (88.338 kg)  04/30/15 194 lb (87.998 kg)  04/22/15 200 lb (90.719 kg)     Other studies reviewed: Additional studies/records reviewed today include: summarized above  ASSESSMENT AND PLAN:  1. CAD s/p PCI as above:  -No symptoms concerning for angina at this time -Continue DAPT with aspirin 81 mg and Effient 10 mg daily for life -Coreg 6.25 mg bid -Has SL NTG, though has not needed to use in > 1 year -Ranexa 1000 mg bid, patient is considering discontinuing these. Will leave up to primary cardiologist   2. Ischemic cardiomyopathy:  -He does not appear to be volume overloaded on exam today -Continue Coreg 6.25 mg bid and Ramipril 2.5 mg daily -Consider adding spironolactone  -Get a scale from CHF clinic and weight daily -Limit salt and PO fluids  3. HTN:  -Well controlled -Amlodipine 5 mg, consider changing given his cardiomyopathy, will leave to primary cardiologist  -Coreg as above -Ramipril 2.5 mg daily  4. HLD:  -Lipitor 80 mg daily -Niaspan 1000 mg 2 tabs qhs -Check lipid and liver -Consider adding Zetia pending the above lipid -Healthy diet and exercise  5. Obesity/OSA: -Uses CPAP nightly  6. DM2: -Poorly controlled -Does not check blood sugar at home -Per PCP  7. Tooth abscess: -Status post Cleocin -Looking for  DDS  Disposition: F/u with Dr. Rockey Situ, MD in 6 months  Current medicines are reviewed at length with the patient today.  The patient did not have any concerns regarding medicines.  Melvern Banker PA-C 06/04/2015 11:14 AM     [address] [place], [place] [phone]

## 2015-06-04 ENCOUNTER — Encounter: Payer: Self-pay | Admitting: Physician Assistant

## 2015-06-04 ENCOUNTER — Ambulatory Visit (INDEPENDENT_AMBULATORY_CARE_PROVIDER_SITE_OTHER): Admitting: Physician Assistant

## 2015-06-04 VITALS — BP 122/80 | HR 87 | Ht 68.0 in | Wt 194.8 lb

## 2015-06-04 DIAGNOSIS — I255 Ischemic cardiomyopathy: Secondary | ICD-10-CM | POA: Diagnosis not present

## 2015-06-04 DIAGNOSIS — I159 Secondary hypertension, unspecified: Secondary | ICD-10-CM

## 2015-06-04 DIAGNOSIS — E785 Hyperlipidemia, unspecified: Secondary | ICD-10-CM

## 2015-06-04 DIAGNOSIS — I2510 Atherosclerotic heart disease of native coronary artery without angina pectoris: Secondary | ICD-10-CM

## 2015-06-04 DIAGNOSIS — I10 Essential (primary) hypertension: Secondary | ICD-10-CM | POA: Diagnosis not present

## 2015-06-04 DIAGNOSIS — G4733 Obstructive sleep apnea (adult) (pediatric): Secondary | ICD-10-CM

## 2015-06-04 DIAGNOSIS — E1165 Type 2 diabetes mellitus with hyperglycemia: Secondary | ICD-10-CM

## 2015-06-04 DIAGNOSIS — K047 Periapical abscess without sinus: Secondary | ICD-10-CM

## 2015-06-04 NOTE — Patient Instructions (Signed)
Medication Instructions:  Your physician recommends that you continue on your current medications as directed. Please refer to the Current Medication list given to you today.   Labwork: Lipid, CMET  Testing/Procedures: none  Follow-Up: Your physician wants you to follow-up in: six months with Dr. Rockey Situ.  You will receive a reminder letter in the mail two months in advance. If you don't receive a letter, please call our office to schedule the follow-up appointment.   Any Other Special Instructions Will Be Listed Below (If Applicable).     If you need a refill on your cardiac medications before your next appointment, please call your pharmacy.

## 2015-06-05 LAB — COMPREHENSIVE METABOLIC PANEL
A/G RATIO: 2 (ref 1.1–2.5)
ALT: 31 IU/L (ref 0–44)
AST: 22 IU/L (ref 0–40)
Albumin: 4.6 g/dL (ref 3.6–4.8)
Alkaline Phosphatase: 114 IU/L (ref 39–117)
BILIRUBIN TOTAL: 1 mg/dL (ref 0.0–1.2)
BUN/Creatinine Ratio: 13 (ref 10–22)
BUN: 13 mg/dL (ref 8–27)
CALCIUM: 10.1 mg/dL (ref 8.6–10.2)
CHLORIDE: 96 mmol/L (ref 96–106)
CO2: 22 mmol/L (ref 18–29)
Creatinine, Ser: 1.02 mg/dL (ref 0.76–1.27)
GFR calc Af Amer: 89 mL/min/{1.73_m2} (ref >59)
GFR calc non Af Amer: 77 mL/min/{1.73_m2} (ref >59)
GLUCOSE: 488 mg/dL — AB (ref 65–99)
Globulin, Total: 2.3 g/dL (ref 1.5–4.5)
POTASSIUM: 4.4 mmol/L (ref 3.5–5.2)
Sodium: 134 mmol/L (ref 134–144)
Total Protein: 6.9 g/dL (ref 6.0–8.5)

## 2015-06-05 LAB — LIPID PANEL
CHOL/HDL RATIO: 2.9 ratio (ref 0.0–5.0)
Cholesterol, Total: 147 mg/dL (ref 100–199)
HDL: 50 mg/dL (ref >39)
LDL CALC: 82 mg/dL (ref 0–99)
TRIGLYCERIDES: 74 mg/dL (ref 0–149)
VLDL Cholesterol Cal: 15 mg/dL (ref 5–40)

## 2015-06-06 ENCOUNTER — Telehealth: Payer: Self-pay | Admitting: Physician Assistant

## 2015-06-06 NOTE — Telephone Encounter (Signed)
See lab results note.

## 2015-06-06 NOTE — Telephone Encounter (Signed)
Patient wants lab results from 06/04/15 . Please call.

## 2015-06-24 ENCOUNTER — Other Ambulatory Visit: Payer: Self-pay | Admitting: Cardiovascular Disease

## 2015-07-31 ENCOUNTER — Other Ambulatory Visit (INDEPENDENT_AMBULATORY_CARE_PROVIDER_SITE_OTHER)

## 2015-07-31 ENCOUNTER — Ambulatory Visit: Admitting: Nurse Practitioner

## 2015-07-31 DIAGNOSIS — E785 Hyperlipidemia, unspecified: Secondary | ICD-10-CM | POA: Diagnosis not present

## 2015-08-01 ENCOUNTER — Ambulatory Visit: Admitting: Nurse Practitioner

## 2015-08-01 LAB — LIPID PANEL
Chol/HDL Ratio: 2.8 ratio units (ref 0.0–5.0)
Cholesterol, Total: 113 mg/dL (ref 100–199)
HDL: 41 mg/dL (ref >39)
LDL Calculated: 57 mg/dL (ref 0–99)
Triglycerides: 73 mg/dL (ref 0–149)
VLDL Cholesterol Cal: 15 mg/dL (ref 5–40)

## 2015-08-01 LAB — HEPATIC FUNCTION PANEL
ALT: 30 IU/L (ref 0–44)
AST: 22 IU/L (ref 0–40)
Albumin: 4.4 g/dL (ref 3.6–4.8)
Alkaline Phosphatase: 87 IU/L (ref 39–117)
BILIRUBIN TOTAL: 0.7 mg/dL (ref 0.0–1.2)
BILIRUBIN, DIRECT: 0.17 mg/dL (ref 0.00–0.40)
Total Protein: 6.3 g/dL (ref 6.0–8.5)

## 2015-08-06 ENCOUNTER — Ambulatory Visit (INDEPENDENT_AMBULATORY_CARE_PROVIDER_SITE_OTHER): Admitting: Nurse Practitioner

## 2015-08-06 ENCOUNTER — Encounter: Payer: Self-pay | Admitting: Nurse Practitioner

## 2015-08-06 VITALS — BP 102/58 | HR 95 | Temp 98.1°F | Resp 14 | Ht 68.0 in | Wt 195.6 lb

## 2015-08-06 DIAGNOSIS — I159 Secondary hypertension, unspecified: Secondary | ICD-10-CM

## 2015-08-06 DIAGNOSIS — E1165 Type 2 diabetes mellitus with hyperglycemia: Secondary | ICD-10-CM | POA: Diagnosis not present

## 2015-08-06 DIAGNOSIS — IMO0001 Reserved for inherently not codable concepts without codable children: Secondary | ICD-10-CM

## 2015-08-06 LAB — CBC WITH DIFFERENTIAL/PLATELET
BASOS ABS: 0.1 10*3/uL (ref 0.0–0.1)
Basophils Relative: 0.8 % (ref 0.0–3.0)
EOS ABS: 0.3 10*3/uL (ref 0.0–0.7)
Eosinophils Relative: 4.1 % (ref 0.0–5.0)
HCT: 42.4 % (ref 39.0–52.0)
HEMOGLOBIN: 14.4 g/dL (ref 13.0–17.0)
LYMPHS ABS: 2.3 10*3/uL (ref 0.7–4.0)
Lymphocytes Relative: 37.3 % (ref 12.0–46.0)
MCHC: 34 g/dL (ref 30.0–36.0)
MCV: 94.4 fl (ref 78.0–100.0)
MONO ABS: 0.5 10*3/uL (ref 0.1–1.0)
Monocytes Relative: 8.3 % (ref 3.0–12.0)
NEUTROS PCT: 49.5 % (ref 43.0–77.0)
Neutro Abs: 3.1 10*3/uL (ref 1.4–7.7)
Platelets: 205 10*3/uL (ref 150.0–400.0)
RBC: 4.49 Mil/uL (ref 4.22–5.81)
RDW: 13.3 % (ref 11.5–15.5)
WBC: 6.3 10*3/uL (ref 4.0–10.5)

## 2015-08-06 LAB — HEMOGLOBIN A1C: HEMOGLOBIN A1C: 11.5 % — AB (ref 4.6–6.5)

## 2015-08-06 NOTE — Progress Notes (Signed)
Patient ID: [name], male    DOB: 1950/11/17  Age: 65 y.o. MRN: [id number]  CC: Follow-up   HPI [name] presents for a 3 month follow up.   1) Recently had dental abstraction from abcsess  Right lower jaw, finished antibiotics and no recent sign of infection [facility]  Low BP today Feel down ever since the tooth was pulled  History [name] has a past medical history of Myocardial infarction ([name]); CAD (coronary artery disease) (04/12/2011); HTN (hypertension) (04/12/2011); Hyperlipemia (04/12/2011); Angina; OSA (obstructive sleep apnea); Osteoarthritis; ED (erectile dysfunction); Diabetes mellitus without complication ([name]); Ischemic cardiomyopathy; and Obesity.   He has past surgical history that includes Coronary stent placement; Tonsillectomy and adenoidectomy; Coronary angioplasty; left heart catheterization with coronary angiogram (N/A, 04/14/2011); and left heart catheterization with coronary angiogram (N/A, 06/25/2012).   His family history includes COPD in his mother; Heart disease in his father; Other in his father; Prostate cancer in his father; Stroke in his father and sister.He reports that he quit smoking about 7 years ago. His smoking use included Cigarettes. He has never used smokeless tobacco. He reports that he drinks alcohol. He reports that he does not use illicit drugs.  Outpatient Prescriptions Prior to Visit  Medication Sig Dispense Refill  . amLODipine (NORVASC) 5 MG tablet TAKE 1 TABLET EVERY MORNING (PLEASE CALL OUR OFFICE TO SCHEDULE AN 6 MONTH APPOINTMENT FOR FUTURE REFILLS. [phone]) 90 tablet 3  . aspirin 325 MG tablet Take 325 mg by mouth every morning.    Marland Kitchen atorvastatin (LIPITOR) 80 MG tablet TAKE 1 TABLET DAILY AT 6 P.M. (PLEASE CALL OUR OFFICE TO SCHEDULE AN 6 MONTH APPOINTMENT FOR FUTURE REFILLS. [phone]) 90 tablet 3  . carvedilol (COREG) 6.25 MG tablet TAKE 1 TABLET TWICE A DAY WITH MEALS (PLEASE CALL OUR OFFICE TO  SCHEDULE AN 6 MONTHS APPOINTMENT FOR FUTURE REFILLS. [phone]) 180 tablet 3  . EFFIENT 10 MG TABS tablet TAKE 1 TABLET DAILY (PLEASE CALL OUR OFFICE TO SCHEDULE AN 6 MONTH APPOINTMENT FOR FUTURE REFILLS. [phone]) 90 tablet 3  . Lactobacillus (ACIDOPHILUS PROBIOTIC) 100 MG CAPS Take 1 capsule (100 mg total) by mouth daily. 15 capsule 0  . metFORMIN (GLUCOPHAGE) 1000 MG tablet Take 1 tablet (1,000 mg total) by mouth 2 (two) times daily with a meal. 60 tablet 0  . Multiple Vitamins-Minerals (MULTIVITAMINS THER. W/MINERALS) TABS Take 1 tablet by mouth every morning.     . niacin (NIASPAN) 1000 MG CR tablet TAKE 2 TABLETS (2,000 MG TOTAL) AT BEDTIME, PLEASE CALL OFFICE TO SCHEDULE A 6 MONTH APPOINTMENT FOR FUTURE REFILLS, [phone] 180 tablet 3  . nitroGLYCERIN (NITROLINGUAL) 0.4 MG/SPRAY spray Place 1 spray under the tongue every 5 (five) minutes as needed for chest pain. 12 g 12  . ramipril (ALTACE) 2.5 MG capsule TAKE 1 CAPSULE EVERY MORNING 90 capsule 3  . RANEXA 1000 MG SR tablet TAKE 1 TABLET EVERY 12 HOURS (PLEASE CALL OUR OFFICE TO SCHEDULE AN 6 MONTH APPOINTMENT FOR FUTURE REFILLS. [phone]) 180 tablet 3  . sildenafil (VIAGRA) 100 MG tablet Take 1 tablet (100 mg total) by mouth daily as needed for erectile dysfunction. 7 tablet 5  . zoster vaccine live, PF, (ZOSTAVAX) 09811 UNT/0.65ML injection Inject 19,400 Units into the skin once. 1 each 0   No facility-administered medications prior to visit.    ROS Review of Systems  Constitutional: Positive for fatigue. Negative for fever, chills and diaphoresis.  HENT: Positive for dental problem. Negative for congestion, ear  pain, mouth sores, rhinorrhea, sinus pressure, sneezing, sore throat and trouble swallowing.   Eyes: Negative for visual disturbance.  Respiratory: Negative for cough, chest tightness, shortness of breath and wheezing.     Objective:  BP 102/58 mmHg  Pulse 95  Temp(Src) 98.1 F (36.7 C) (Oral)  Resp 14  Ht  5\' 8"  (1.727 m)  Wt 195 lb 9.6 oz (88.724 kg)  BMI 29.75 kg/m2  SpO2 96%  Physical Exam  Constitutional: He is oriented to person, place, and time. He appears well-developed and well-nourished. No distress.  HENT:  Head: Normocephalic and atraumatic.  Right Ear: External ear normal.  Left Ear: External ear normal.  Mouth/Throat: No oropharyngeal exudate.  Neck: Normal range of motion. Neck supple.  Cardiovascular: Normal rate, regular rhythm and normal heart sounds.   Pulmonary/Chest: Effort normal and breath sounds normal. No respiratory distress. He has no wheezes. He has no rales. He exhibits no tenderness.  Lymphadenopathy:    He has no cervical adenopathy.  Neurological: He is alert and oriented to person, place, and time.  Skin: Skin is warm and dry. No rash noted. He is not diaphoretic.  Psychiatric: He has a normal mood and affect. His behavior is normal. Judgment and thought content normal.      Assessment & Plan:   [name] was seen today for follow-up.  Diagnoses and all orders for this visit:  Uncontrolled type 2 diabetes mellitus without complication, without long-term current use of insulin (HCC) -     HgB A1c -     CBC with Differential/Platelet  Secondary hypertension, unspecified   I am having [name] maintain his multivitamins ther. w/minerals, sildenafil, zoster vaccine live (PF), nitroGLYCERIN, aspirin, ACIDOPHILUS PROBIOTIC, metFORMIN, ramipril, RANEXA, EFFIENT, atorvastatin, amLODipine, niacin, and carvedilol.  No orders of the defined types were placed in this encounter.     Follow-up: Return in about 3 months (around 11/06/2015) for Follow up.

## 2015-08-06 NOTE — Patient Instructions (Addendum)
Cut back on Norvasc and see if that helps with blood pressure.   Please visit the lab today.

## 2015-08-08 NOTE — Assessment & Plan Note (Signed)
Pt on lower end today and asymptomatic/well appearing Stopping Norvasc for right now

## 2015-08-08 NOTE — Assessment & Plan Note (Signed)
Checking A1c Pt on max dosage of metformin Will discuss further treatment

## 2015-08-10 ENCOUNTER — Ambulatory Visit (INDEPENDENT_AMBULATORY_CARE_PROVIDER_SITE_OTHER)

## 2015-08-10 ENCOUNTER — Telehealth: Payer: Self-pay

## 2015-08-10 ENCOUNTER — Other Ambulatory Visit: Payer: Self-pay | Admitting: Nurse Practitioner

## 2015-08-10 VITALS — BP 122/82 | HR 87

## 2015-08-10 DIAGNOSIS — I10 Essential (primary) hypertension: Secondary | ICD-10-CM | POA: Diagnosis not present

## 2015-08-10 MED ORDER — EMPAGLIFLOZIN 10 MG PO TABS: 10.0000 mg | ORAL_TABLET | Freq: Every day | ORAL | Status: DC

## 2015-08-10 NOTE — Progress Notes (Signed)
Patient as in the office today wanting a BP check done because he said that he felt it was low and was not feeling like himself. Patient received this check in the left arm. Patient also stated he had a abscessed removed from his mouth with no numbing medication. He feels that could be the cause of low BP.  When reading Turkessa's note of the medication please disregard patient stated he does not want a basal insulin he would like a oral.

## 2015-08-10 NOTE — Telephone Encounter (Signed)
PA for Jardiance started on cover my meds

## 2015-08-10 NOTE — Progress Notes (Signed)
I have reviewed the note and sent Jardiance to the pharmacy requested. See other notes for full information.   Lorane Gell, AGNP-C   [employer] [facility] [employer]

## 2015-08-10 NOTE — Telephone Encounter (Signed)
Pt came in today to have his BP check, pt c/o his bp being low. He also states that there was supposed to be insulin called into his pharmacy and he went to p/u his meds and nothing was there. According to the last result note basal insulin was supposed to be called into the pharmacy at [facility] in [place]. Please advise, thanks

## 2015-08-10 NOTE — Telephone Encounter (Signed)
Disregard patient does not want basil insulin. He does want a oral and no injectable medications.

## 2015-08-10 NOTE — Telephone Encounter (Signed)
Approved from 07/11/2015-12/31-2099

## 2015-08-18 NOTE — Progress Notes (Signed)
Patient ID: [name], male   DOB: 1950-10-06, 65 y.o.   MRN: [id number] I reviewed the above documentation and agree with the plan. [name], M.D.

## 2015-09-07 ENCOUNTER — Other Ambulatory Visit: Payer: Self-pay

## 2015-09-07 MED ORDER — EMPAGLIFLOZIN 10 MG PO TABS: 10.0000 mg | ORAL_TABLET | Freq: Every day | ORAL | Status: DC

## 2015-10-09 ENCOUNTER — Other Ambulatory Visit: Payer: Self-pay | Admitting: Family Medicine

## 2015-10-09 MED ORDER — METFORMIN HCL 1000 MG PO TABS: 1000.0000 mg | ORAL_TABLET | Freq: Two times a day (BID) | ORAL | Status: DC

## 2015-10-16 ENCOUNTER — Other Ambulatory Visit: Payer: Self-pay | Admitting: *Deleted

## 2015-11-07 ENCOUNTER — Ambulatory Visit: Admitting: Nurse Practitioner

## 2015-11-07 ENCOUNTER — Encounter: Payer: Self-pay | Admitting: Family Medicine

## 2015-11-07 ENCOUNTER — Ambulatory Visit (INDEPENDENT_AMBULATORY_CARE_PROVIDER_SITE_OTHER): Admitting: Family Medicine

## 2015-11-07 VITALS — BP 116/78 | HR 93 | Temp 98.2°F

## 2015-11-07 DIAGNOSIS — E785 Hyperlipidemia, unspecified: Secondary | ICD-10-CM

## 2015-11-07 DIAGNOSIS — E1159 Type 2 diabetes mellitus with other circulatory complications: Secondary | ICD-10-CM

## 2015-11-07 DIAGNOSIS — E1165 Type 2 diabetes mellitus with hyperglycemia: Secondary | ICD-10-CM

## 2015-11-07 DIAGNOSIS — R079 Chest pain, unspecified: Secondary | ICD-10-CM | POA: Diagnosis not present

## 2015-11-07 DIAGNOSIS — IMO0001 Reserved for inherently not codable concepts without codable children: Secondary | ICD-10-CM

## 2015-11-07 DIAGNOSIS — I25119 Atherosclerotic heart disease of native coronary artery with unspecified angina pectoris: Secondary | ICD-10-CM

## 2015-11-07 MED ORDER — ATORVASTATIN CALCIUM 80 MG PO TABS: ORAL_TABLET | ORAL | Status: DC

## 2015-11-07 MED ORDER — CARVEDILOL 6.25 MG PO TABS: ORAL_TABLET | ORAL | Status: DC

## 2015-11-07 MED ORDER — NIACIN ER (ANTIHYPERLIPIDEMIC) 1000 MG PO TBCR: EXTENDED_RELEASE_TABLET | ORAL | Status: DC

## 2015-11-07 NOTE — Progress Notes (Signed)
Patient ID: [name], male   DOB: 08/19/1950, 65 y.o.   MRN: [id number]  Isaac Rumps, MD Phone: [phone]  [name] is a 65 y.o. male who presents today for follow-up.  CAD/cardiomyopathy: Patient reports he has not had any issues with breathing. He does note an occasional right sided central chest discomfort that seems more muscular in nature as it hurts when he does that area. Not exertional. No radiation. No shortness of breath with this. No diaphoresis. He has not had to use his nitroglycerin for this. He is taking Coreg and ramipril. Taking Effient and Ranexa as well. No weight gain. No edema.  DIABETES Disease Monitoring: Blood Sugar ranges-not checking Polyuria/phagia/dipsia- no      Medications: Compliance- taking metformin and jardiance Hypoglycemic symptoms- rare, gets a little shaky. Eat. Resolves with eating.  HYPERLIPIDEMIA Disease Monitoring: See symptoms for CAD Medications: Compliance- taking Lipitor and niacin Right upper quadrant pain- no  Muscle aches- no   PMH: Former smoker   ROS see history of present illness  Objective  Physical Exam Filed Vitals:   11/07/15 1632  BP: 116/78  Pulse: 93  Temp: 98.2 F (36.8 C)    BP Readings from Last 3 Encounters:  11/07/15 116/78  08/10/15 122/82  08/06/15 102/58   Wt Readings from Last 3 Encounters:  08/06/15 195 lb 9.6 oz (88.724 kg)  06/04/15 194 lb 12 oz (88.338 kg)  04/30/15 194 lb (87.998 kg)    Physical Exam  Constitutional: He is well-developed, well-nourished, and in no distress.  HENT:  Head: Normocephalic and atraumatic.  Right Ear: External ear normal.  Left Ear: External ear normal.  Cardiovascular: Normal rate, regular rhythm and normal heart sounds.   Pulmonary/Chest: Effort normal and breath sounds normal. He exhibits no tenderness.  Neurological: He is alert. Gait normal.  Skin: Skin is warm and dry. He is not diaphoretic.   EKG: Normal sinus rhythm, rate  86, no new ST or T-wave changes, old anterior infarct  Assessment/Plan: Please see individual problem list.  CAD (coronary artery disease) Patient with atypical right-sided chest discomfort unlikely due to cardiac cause. Given cardiac history EKG was performed revealing no apparent ischemic changes. Potentially musculoskeletal in nature. Atypical in nature. Patient would like to continue to monitor this. We will have patient follow-up with his cardiologist for 6 month follow-up next month. He'll continue his current medications. He'll continue to monitor. He is given return precautions.  Hyperlipemia Tolerating medications. Continue Lipitor and niacin.  Diabetes mellitus type 2, uncontrolled, without complications ([place]) Patient reports he has been taking metformin once daily and Jardiance daily. I reinforced that he needs to take metformin twice daily. He will start taking metformin as prescribed. Also take Jardiance. We'll check an A1c today.    Orders Placed This Encounter  Procedures  . HgB A1c  . EKG 12-Lead    Meds ordered this encounter  Medications  . atorvastatin (LIPITOR) 80 MG tablet    Sig: TAKE 1 TABLET DAILY AT 6 P.M. (PLEASE CALL CARDIOLOGY OFFICE TO SCHEDULE AN 6 MONTH APPOINTMENT FOR FUTURE REFILLS. [phone])    Dispense:  90 tablet    Refill:  0  . carvedilol (COREG) 6.25 MG tablet    Sig: TAKE 1 TABLET TWICE A DAY WITH MEALS (PLEASE CALL CARDIOLOGY OFFICE TO SCHEDULE AN 6 MONTHS APPOINTMENT FOR FUTURE REFILLS. [phone])    Dispense:  180 tablet    Refill:  0  . niacin (NIASPAN) 1000 MG CR tablet  Sig: TAKE 2 TABLETS (2,000 MG TOTAL) AT BEDTIME, PLEASE CALL CARDIOLOGY OFFICE TO SCHEDULE A 6 MONTH APPOINTMENT FOR FUTURE REFILLS, [phone]    Dispense:  180 tablet    Refill:  0    Isaac Rumps, MD [place]

## 2015-11-07 NOTE — Patient Instructions (Signed)
Nice to meet you. Please call to set up a 6 month follow-up with the cardiologist. Please request her Ranexa from her pharmacy. We will send in refills of your other medications. Please start taking the metformin twice daily in addition to the jardiance once daily. If you develop persistent chest pain, or develop shortness of breath, sweatiness, palpitations, or any new or changing symptoms please seek medical attention.

## 2015-11-07 NOTE — Assessment & Plan Note (Addendum)
Patient with atypical right-sided chest discomfort unlikely due to cardiac cause. Given cardiac history EKG was performed revealing no apparent ischemic changes. Potentially musculoskeletal in nature. Atypical in nature. Patient would like to continue to monitor this. We will have patient follow-up with his cardiologist for 6 month follow-up next month. He'll continue his current medications. He'll continue to monitor. He is given return precautions.

## 2015-11-07 NOTE — Assessment & Plan Note (Signed)
Patient reports he has been taking metformin once daily and Jardiance daily. I reinforced that he needs to take metformin twice daily. He will start taking metformin as prescribed. Also take Jardiance. We'll check an A1c today.

## 2015-11-07 NOTE — Assessment & Plan Note (Signed)
Tolerating medications. Continue Lipitor and niacin.

## 2015-11-08 LAB — HEMOGLOBIN A1C: HEMOGLOBIN A1C: 9.7 % — AB (ref 4.6–6.5)

## 2015-12-07 ENCOUNTER — Telehealth: Payer: Self-pay | Admitting: *Deleted

## 2015-12-07 MED ORDER — EMPAGLIFLOZIN 10 MG PO TABS: 10.0000 mg | ORAL_TABLET | Freq: Every day | ORAL | 1 refills | Status: DC

## 2015-12-07 NOTE — Telephone Encounter (Signed)
Patient called and states that [employer] told him he can get a 90 day supply of Jardiance for the same price as the 30 day supply . He was wondering if Dr. Caryl Bis can send the 72 supply of Jardiance to [place]. Please advise provider. Thanks

## 2015-12-07 NOTE — Telephone Encounter (Signed)
Rx sent, thanks 

## 2016-02-07 ENCOUNTER — Encounter: Payer: Self-pay | Admitting: Family Medicine

## 2016-02-07 ENCOUNTER — Encounter (INDEPENDENT_AMBULATORY_CARE_PROVIDER_SITE_OTHER): Payer: Self-pay

## 2016-02-07 ENCOUNTER — Ambulatory Visit (INDEPENDENT_AMBULATORY_CARE_PROVIDER_SITE_OTHER): Payer: Medicare Other | Admitting: Family Medicine

## 2016-02-07 VITALS — BP 116/78 | HR 92 | Temp 98.4°F | Wt 202.2 lb

## 2016-02-07 DIAGNOSIS — Z23 Encounter for immunization: Secondary | ICD-10-CM

## 2016-02-07 DIAGNOSIS — L570 Actinic keratosis: Secondary | ICD-10-CM

## 2016-02-07 DIAGNOSIS — E1159 Type 2 diabetes mellitus with other circulatory complications: Secondary | ICD-10-CM | POA: Diagnosis not present

## 2016-02-07 DIAGNOSIS — I255 Ischemic cardiomyopathy: Secondary | ICD-10-CM

## 2016-02-07 DIAGNOSIS — M25551 Pain in right hip: Secondary | ICD-10-CM | POA: Insufficient documentation

## 2016-02-07 LAB — COMPREHENSIVE METABOLIC PANEL
ALT: 23 U/L (ref 0–53)
AST: 17 U/L (ref 0–37)
Albumin: 4.4 g/dL (ref 3.5–5.2)
Alkaline Phosphatase: 55 U/L (ref 39–117)
BUN: 16 mg/dL (ref 6–23)
CO2: 26 mEq/L (ref 19–32)
Calcium: 9.7 mg/dL (ref 8.4–10.5)
Chloride: 105 mEq/L (ref 96–112)
Creatinine, Ser: 0.99 mg/dL (ref 0.40–1.50)
GFR: 80.58 mL/min (ref >60.00)
Glucose, Bld: 166 mg/dL — ABNORMAL HIGH (ref 70–99)
Potassium: 3.8 mEq/L (ref 3.5–5.1)
Sodium: 138 mEq/L (ref 135–145)
Total Bilirubin: 1.5 mg/dL — ABNORMAL HIGH (ref 0.2–1.2)
Total Protein: 7.2 g/dL (ref 6.0–8.3)

## 2016-02-07 LAB — HEMOGLOBIN A1C: HEMOGLOBIN A1C: 8.1 % — AB (ref 4.6–6.5)

## 2016-02-07 NOTE — Patient Instructions (Signed)
Nice to see you. We will refer you to dermatology. We would like for you to see your cardiologist in follow-up. We will arrange for this. I have given you exercises below to complete for your hip and back. If you develop chest pain, persistent shortness of breath or any new or changing symptoms please seek medical attention.   Iliotibial Band Syndrome With Rehab The iliotibial (IT) band is a tendon that connects the hip muscles to the shinbone (tibia) and to one of the bones of the pelvis (ileum). The IT band passes by the knee and is often irritated by the outer portion of the knee (lateral femoral condyle). A fluid filled sac (bursa) exists between the tendon and the bone, to cushion and reduce friction. Overuse of the tendon may cause excessive friction, which results in IT band syndrome. This condition involves inflammation of the bursa (bursitis) and/or inflammation of the IT band (tendinitis). SYMPTOMS   Pain, tenderness, swelling, warmth, or redness over the IT band, at the outer knee (above the joint).  Pain that travels up or down the thigh or leg.  Initially, pain at the beginning of an exercise, that decreases once warmed up. Eventually, pain throughout the activity, getting worse as the activity continues. May cause the athlete to stop in the middle of training or competing.  Pain that gets worse when running down hills or stairs, on banked tracks, or next to the curb on the street.  Pain that increases when the foot of the affected leg hits the ground.  Possibly, a crackling sound (crepitation) when the tendon or bursa is moved or touched. CAUSES  IT band syndrome is caused by irritation of the IT band and the underlying bursa. This eventually results in inflammation and pain. IT band syndrome is an overuse injury.  RISK INCREASES WITH:  Sports with repetitive knee-bending activities (distance running, cycling).  Incorrect training techniques, including sudden changes in the  intensity, frequency, or duration of training.  Not enough rest between workouts.  Poor strength and flexibility, especially a tight IT band.  Failure to warm up properly before activity.  Bow legs.  Arthritis of the knee. PREVENTION   Warm up and stretch properly before activity.  Allow for adequate recovery between workouts.  Maintain physical fitness:  Strength, flexibility, and endurance.  Cardiovascular fitness.  Learn and use proper training technique, including reducing running mileage, shortening stride, and avoiding running on hills and banked surfaces.  Wear arch supports (orthotics), if you have flat feet. PROGNOSIS  If treated properly, IT band syndrome usually goes away within 6 weeks of treatment. RELATED COMPLICATIONS   Longer healing time, if not properly treated, or if not given enough time to heal.  Recurring inflammation of the tendon and bursa, that may result in a chronic condition.  Recurring symptoms, if activity is resumed too soon, with overuse, with a direct blow, or with poor training technique.  Inability to complete training or competition. TREATMENT  Treatment first involves the use of ice and medicine, to reduce pain and inflammation. The use of strengthening and stretching exercises may help reduce pain with activity. These exercises may be performed at home or with a therapist. For individuals with flat feet, an arch support (orthotic) may be helpful. Some individuals find that wearing a knee sleeve or compression bandage around the knee during workouts provides some relief. Certain training techniques, such as adjusting stride length, avoiding running on hills or stairs, changing the direction you run on a circular  or banked track, or changing the side of the road you run on, if you run next to the curb, may help decrease symptoms of IT band syndrome. Cyclists may need to change the seat height or foot position on their bicycles. An injection of  cortisone into the bursa may be recommended. Surgery to remove the inflamed bursa and/or part of the IT band is only considered after at least 6 months of non-surgical treatment.  MEDICATION   If pain medicine is needed, nonsteroidal anti-inflammatory medicines (aspirin and ibuprofen), or other minor pain relievers (acetaminophen), are often advised.  Do not take pain medicine for 7 days before surgery.  Prescription pain relievers may be given, if your caregiver thinks they are needed. Use only as directed and only as much as you need.  Corticosteroid injections may be given by your caregiver. These injections should be reserved for the most serious cases, because they may only be given a certain number of times. HEAT AND COLD  Cold treatment (icing) should be applied for 10 to 15 minutes every 2 to 3 hours for inflammation and pain, and immediately after activity that aggravates your symptoms. Use ice packs or an ice massage.  Heat treatment may be used before performing stretching and strengthening activities prescribed by your caregiver, physical therapist, or athletic trainer. Use a heat pack or a warm water soak. SEEK MEDICAL CARE IF:   Symptoms get worse or do not improve in 2 to 4 weeks, despite treatment.  New, unexplained symptoms develop. (Drugs used in treatment may produce side effects.) EXERCISES  RANGE OF MOTION (ROM) AND STRETCHING EXERCISES - Iliotibial Band Syndrome These exercises may help you when beginning to rehabilitate your injury. Your symptoms may go away with or without further involvement from your physician, physical therapist or athletic trainer. While completing these exercises, remember:   Restoring tissue flexibility helps normal motion to return to the joints. This allows healthier, less painful movement and activity.  An effective stretch should be held for at least 30 seconds.  A stretch should never be painful. You should only feel a gentle lengthening  or release in the stretched tissue. STRETCH - Quadriceps, Prone   Lie on your stomach on a firm surface, such as a bed or padded floor.  Bend your right / left knee and grasp your ankle. If you are unable to reach your ankle or pant leg, use a belt around your foot to lengthen your reach.  Gently pull your heel toward your buttocks. Your knee should not slide out to the side. You should feel a stretch in the front of your thigh and knee.  Hold this position for __________ seconds. Repeat __________ times. Complete this stretch __________ times per day.  STRETCH - Iliotibial Band  On the floor or bed, lie on your side, so your right / left leg is on top. Bend your knee and grab your ankle.  Slowly bring your knee back so that your thigh is in line with your trunk. Keep your heel at your buttocks and gently arch your back, so your head, shoulders and hips line up.  Slowly lower your leg so that your knee approaches the floor or bed, until you feel a gentle stretch on the outside of your right / left thigh. If you do not feel a stretch and your knee will not fall farther, place the heel of your opposite foot on top of your knee, and pull your thigh down farther.  Hold this stretch  for __________ seconds. Repeat __________ times. Complete this stretch __________ times per day. STRENGTHENING EXERCISES - Iliotibial Band Syndrome Improving the flexibility of the IT band will best relieve your discomfort due to IT band syndrome. Strengthening exercises, however, can help improve both muscle endurance and joint mechanics, reducing the factors that can contribute to this condition. Your physician, physical therapist or athletic trainer may provide you with exercises that train specific muscle groups that are especially weak. The following exercises target muscles that are often weak in people who have IT band syndrome. STRENGTH - Hip Abductors, Straight Leg Raises  Be aware of your form throughout the  entire exercise, so that you exercise the correct muscles. Poor form means that you are not strengthening the correct muscles.  Lie on your side, so that your head, shoulders, knee and hip line up. You may bend your lower knee to help maintain your balance. Your right / left leg should be on top.  Roll your hips slightly forward, so that your hips are stacked directly over each other and your right / left knee is facing forward.  Lift your top leg up 4-6 inches, leading with your heel. Be sure that your foot does not drift forward and that your knee does not roll toward the ceiling.  Hold this position for __________ seconds. You should feel the muscles in your outer hip lifting (you may not notice this until your leg begins to tire).  Slowly lower your leg to the starting position. Allow the muscles to fully relax before beginning the next repetition. Repeat __________ times. Complete this exercise __________ times per day.  STRENGTH - Quad/VMO, Isometric  Sit in a chair with your right / left knee slightly bent. With your fingertips, feel the VMO muscle (just above the inside of your knee). The VMO is important in controlling the position of your kneecap.  Keeping your fingertips on this muscle. Without actually moving your leg, attempt to drive your knee down, as if straightening your leg. You should feel your VMO tense. If you have a difficult time, you may wish to try the same exercise on your healthy knee first.  Tense this muscle as hard as you can, without increasing any knee pain.  Hold for __________ seconds. Relax the muscles slowly and completely between each repetition. Repeat __________ times. Complete this exercise __________ times per day.    This information is not intended to replace advice given to you by your health care provider. Make sure you discuss any questions you have with your health care provider.   Document Released: 04/28/2005 Document Revised: 05/19/2014  Document Reviewed: 08/10/2008 Elsevier Interactive Patient Education 2016 [place].   Back Exercises If you have pain in your back, do these exercises 2-3 times each day or as told by your doctor. When the pain goes away, do the exercises once each day, but repeat the steps more times for each exercise (do more repetitions). If you do not have pain in your back, do these exercises once each day or as told by your doctor. EXERCISES Single Knee to Chest Do these steps 3-5 times in a row for each leg: 1. Lie on your back on a firm bed or the floor with your legs stretched out. 2. Bring one knee to your chest. 3. Hold your knee to your chest by grabbing your knee or thigh. 4. Pull on your knee until you feel a gentle stretch in your lower back. 5. Keep doing the stretch  for 10-30 seconds. 6. Slowly let go of your leg and straighten it. Pelvic Tilt Do these steps 5-10 times in a row: 1. Lie on your back on a firm bed or the floor with your legs stretched out. 2. Bend your knees so they point up to the ceiling. Your feet should be flat on the floor. 3. Tighten your lower belly (abdomen) muscles to press your lower back against the floor. This will make your tailbone point up to the ceiling instead of pointing down to your feet or the floor. 4. Stay in this position for 5-10 seconds while you gently tighten your muscles and breathe evenly. Cat-Cow Do these steps until your lower back bends more easily: 1. Get on your hands and knees on a firm surface. Keep your hands under your shoulders, and keep your knees under your hips. You may put padding under your knees. 2. Let your head hang down, and make your tailbone point down to the floor so your lower back is round like the back of a cat. 3. Stay in this position for 5 seconds. 4. Slowly lift your head and make your tailbone point up to the ceiling so your back hangs low (sags) like the back of a cow. 5. Stay in this position for 5  seconds. Press-Ups Do these steps 5-10 times in a row: 1. Lie on your belly (face-down) on the floor. 2. Place your hands near your head, about shoulder-width apart. 3. While you keep your back relaxed and keep your hips on the floor, slowly straighten your arms to raise the top half of your body and lift your shoulders. Do not use your back muscles. To make yourself more comfortable, you may change where you place your hands. 4. Stay in this position for 5 seconds. 5. Slowly return to lying flat on the floor. Bridges Do these steps 10 times in a row: 1. Lie on your back on a firm surface. 2. Bend your knees so they point up to the ceiling. Your feet should be flat on the floor. 3. Tighten your butt muscles and lift your butt off of the floor until your waist is almost as high as your knees. If you do not feel the muscles working in your butt and the back of your thighs, slide your feet 1-2 inches farther away from your butt. 4. Stay in this position for 3-5 seconds. 5. Slowly lower your butt to the floor, and let your butt muscles relax. If this exercise is too easy, try doing it with your arms crossed over your chest. Belly Crunches Do these steps 5-10 times in a row: 1. Lie on your back on a firm bed or the floor with your legs stretched out. 2. Bend your knees so they point up to the ceiling. Your feet should be flat on the floor. 3. Cross your arms over your chest. 4. Tip your chin a little bit toward your chest but do not bend your neck. 5. Tighten your belly muscles and slowly raise your chest just enough to lift your shoulder blades a tiny bit off of the floor. 6. Slowly lower your chest and your head to the floor. Back Lifts Do these steps 5-10 times in a row: 1. Lie on your belly (face-down) with your arms at your sides, and rest your forehead on the floor. 2. Tighten the muscles in your legs and your butt. 3. Slowly lift your chest off of the floor while you keep your hips on  the floor. Keep the back of your head in line with the curve in your back. Look at the floor while you do this. 4. Stay in this position for 3-5 seconds. 5. Slowly lower your chest and your face to the floor. GET HELP IF:  Your back pain gets a lot worse when you do an exercise.  Your back pain does not lessen 2 hours after you exercise. If you have any of these problems, stop doing the exercises. Do not do them again unless your doctor says it is okay. GET HELP RIGHT AWAY IF:  You have sudden, very bad back pain. If this happens, stop doing the exercises. Do not do them again unless your doctor says it is okay.   This information is not intended to replace advice given to you by your health care provider. Make sure you discuss any questions you have with your health care provider.   Document Released: 05/31/2010 Document Revised: 01/17/2015 Document Reviewed: 06/22/2014 Elsevier Interactive Patient Education [employer].

## 2016-02-07 NOTE — Progress Notes (Signed)
[name], MD Phone: [phone]  [name] is a 65 y.o. male who presents today for follow-up.  Patient notes 1 erythematous rough spot on his bilateral biceps. Has been there for a couple months. No change recently. No itching. They are more sensitive. He's never had them before. No history of skin cancer.  Patient additionally notes some right hip discomfort that is lateral radiating down the lateral aspect of his thigh to his knee. Not constant. It is intermittent. If he sits wrong it will get irritated. Occasionally does get some mild low back discomfort. Worse if he is sitting for a long period of time. No numbness or weakness. No saddle anesthesia, loss of bowel or bladder function, or fevers.  Patient notes no chest pain. Does note some slight increase in exertional dyspnea. Does not occur all the time. Notes he is breathing well at this time. No orthopnea or PND. No edema. Has not seen his cardiologist in the last 6 months.  DIABETES Disease Monitoring: Blood Sugar ranges-not checking Polyuria/phagia/dipsia- no     Medications: Compliance- taking metformin, Jardiance Hypoglycemic symptoms- no  PMH: Former smoker   ROS see history of present illness  Objective  Physical Exam Vitals:   02/07/16 1049  BP: 116/78  Pulse: 92  Temp: 98.4 F (36.9 C)    BP Readings from Last 3 Encounters:  02/07/16 116/78  11/07/15 116/78  08/10/15 122/82   Wt Readings from Last 3 Encounters:  02/07/16 202 lb 4 oz (91.7 kg)  08/06/15 195 lb 9.6 oz (88.7 kg)  06/04/15 194 lb 12 oz (88.3 kg)    Physical Exam  Constitutional: He is well-developed, well-nourished, and in no distress.  HENT:  Head: Normocephalic and atraumatic.  Cardiovascular: Normal rate, regular rhythm and normal heart sounds.   Pulmonary/Chest: Effort normal and breath sounds normal.  Musculoskeletal:  No midline spine tenderness, no midline spine step-off, no muscular back tenderness, no hip  tenderness bilaterally, no discomfort on internal or external range of motion, no thigh discomfort on palpation bilaterally  Neurological: He is alert. Gait normal.  5 out of 5 strength bilateral quads, hamstrings, plantar flexion, and dorsiflexion, sensation to light touch intact in bilateral lower extremities  Skin: Skin is warm and dry.     Assessment/Plan: Please see individual problem list.  Ischemic cardiomyopathy Patient with no chest pain though does report some mild dyspnea on exertion recently. No symptoms at this time. No other symptoms of heart failure currently. We will have our referral coordinator arrange for follow-up with his cardiologist. He is given return precautions.  Diabetes mellitus type 2, uncontrolled, without complications (HCC) W1U today. Continue current medications.  Actinic keratoses Refer to dermatology.  Right hip pain Patient with right hip pain recently. Location and the area of radiation would argue for trochanteric bursitis versus IT band syndrome. Does have some back pain though that is not associated with this hip pain. Discussed and advised on exercises for IT band syndrome and low back discomfort. He will continue to monitor both of these and if not improving could consider physical therapy or referral to sports medicine.   Orders Placed This Encounter  Procedures  . Flu vaccine HIGH DOSE PF  . HgB A1c  . Comp Met (CMET)  . Ambulatory referral to Dermatology    Referral Priority:   Routine    Referral Type:   Consultation    Referral Reason:   Specialty Services Required    Requested Specialty:   Dermatology  Number of Visits Requested:   [place], MD [place]

## 2016-02-07 NOTE — Assessment & Plan Note (Signed)
Refer to dermatology 

## 2016-02-07 NOTE — Assessment & Plan Note (Signed)
Patient with no chest pain though does report some mild dyspnea on exertion recently. No symptoms at this time. No other symptoms of heart failure currently. We will have our referral coordinator arrange for follow-up with his cardiologist. He is given return precautions.

## 2016-02-07 NOTE — Assessment & Plan Note (Signed)
A1c today.  Continue current medications. 

## 2016-02-07 NOTE — Assessment & Plan Note (Signed)
Patient with right hip pain recently. Location and the area of radiation would argue for trochanteric bursitis versus IT band syndrome. Does have some back pain though that is not associated with this hip pain. Discussed and advised on exercises for IT band syndrome and low back discomfort. He will continue to monitor both of these and if not improving could consider physical therapy or referral to sports medicine.

## 2016-02-08 ENCOUNTER — Telehealth: Payer: Self-pay | Admitting: Family Medicine

## 2016-02-08 NOTE — Telephone Encounter (Signed)
Pt sent a Mychart request wanting to get a  Hepatitis C Screening  Pna Vac Low Risk Adult   Thank you!

## 2016-02-11 ENCOUNTER — Telehealth: Payer: Self-pay | Admitting: Family Medicine

## 2016-02-11 NOTE — Telephone Encounter (Signed)
Pt called back returning your call. Thank you! °

## 2016-02-12 NOTE — Telephone Encounter (Signed)
Patient advised of result.  

## 2016-03-06 ENCOUNTER — Telehealth: Payer: Self-pay | Admitting: Family Medicine

## 2016-03-06 MED ORDER — METFORMIN HCL 1000 MG PO TABS: 1000.0000 mg | ORAL_TABLET | Freq: Two times a day (BID) | ORAL | 3 refills | Status: DC

## 2016-03-06 NOTE — Telephone Encounter (Signed)
Pt needs a refill for metFORMIN (GLUCOPHAGE) 1000 MG tablet.  Pharmacy is [facility], [place] 1 month Rx to [facility] and then a 3 month sent to [facility], [place].  Call pt @ [phone]. Thank you!

## 2016-03-06 NOTE — Telephone Encounter (Signed)
Please advise on refill. Does not look like metformin has been refilled since May

## 2016-03-06 NOTE — Telephone Encounter (Signed)
Refill sent to pharmacy.   

## 2016-03-10 ENCOUNTER — Encounter: Payer: Self-pay | Admitting: *Deleted

## 2016-03-10 ENCOUNTER — Ambulatory Visit: Admitting: Cardiovascular Disease

## 2016-04-07 ENCOUNTER — Encounter: Payer: Self-pay | Admitting: Cardiovascular Disease

## 2016-04-07 ENCOUNTER — Ambulatory Visit (INDEPENDENT_AMBULATORY_CARE_PROVIDER_SITE_OTHER): Payer: Medicare Other | Admitting: Cardiovascular Disease

## 2016-04-07 VITALS — BP 126/72 | HR 81 | Ht 68.0 in | Wt 201.8 lb

## 2016-04-07 DIAGNOSIS — E7800 Pure hypercholesterolemia, unspecified: Secondary | ICD-10-CM

## 2016-04-07 DIAGNOSIS — I208 Other forms of angina pectoris: Secondary | ICD-10-CM | POA: Diagnosis not present

## 2016-04-07 DIAGNOSIS — I255 Ischemic cardiomyopathy: Secondary | ICD-10-CM

## 2016-04-07 DIAGNOSIS — I209 Angina pectoris, unspecified: Secondary | ICD-10-CM | POA: Diagnosis not present

## 2016-04-07 DIAGNOSIS — IMO0001 Reserved for inherently not codable concepts without codable children: Secondary | ICD-10-CM

## 2016-04-07 DIAGNOSIS — I25119 Atherosclerotic heart disease of native coronary artery with unspecified angina pectoris: Secondary | ICD-10-CM | POA: Diagnosis not present

## 2016-04-07 DIAGNOSIS — E1165 Type 2 diabetes mellitus with hyperglycemia: Secondary | ICD-10-CM

## 2016-04-07 DIAGNOSIS — I159 Secondary hypertension, unspecified: Secondary | ICD-10-CM | POA: Diagnosis not present

## 2016-04-07 DIAGNOSIS — I2089 Other forms of angina pectoris: Secondary | ICD-10-CM

## 2016-04-07 NOTE — Patient Instructions (Signed)
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
## 2016-05-02 ENCOUNTER — Other Ambulatory Visit: Payer: Self-pay | Admitting: Cardiovascular Disease

## 2016-05-12 DIAGNOSIS — M7032 Other bursitis of elbow, left elbow: Secondary | ICD-10-CM

## 2016-05-12 HISTORY — DX: Other bursitis of elbow, left elbow: M70.32

## 2016-05-15 ENCOUNTER — Encounter: Payer: Self-pay | Admitting: Family Medicine

## 2016-05-15 ENCOUNTER — Ambulatory Visit (INDEPENDENT_AMBULATORY_CARE_PROVIDER_SITE_OTHER): Payer: Medicare Other | Admitting: Family Medicine

## 2016-05-15 VITALS — BP 150/80 | HR 84 | Temp 98.2°F | Wt 199.0 lb

## 2016-05-15 DIAGNOSIS — IMO0001 Reserved for inherently not codable concepts without codable children: Secondary | ICD-10-CM

## 2016-05-15 DIAGNOSIS — E1159 Type 2 diabetes mellitus with other circulatory complications: Secondary | ICD-10-CM | POA: Diagnosis not present

## 2016-05-15 DIAGNOSIS — M5441 Lumbago with sciatica, right side: Secondary | ICD-10-CM | POA: Diagnosis not present

## 2016-05-15 DIAGNOSIS — M545 Low back pain, unspecified: Secondary | ICD-10-CM | POA: Insufficient documentation

## 2016-05-15 DIAGNOSIS — I159 Secondary hypertension, unspecified: Secondary | ICD-10-CM

## 2016-05-15 DIAGNOSIS — R17 Unspecified jaundice: Secondary | ICD-10-CM | POA: Insufficient documentation

## 2016-05-15 DIAGNOSIS — E1165 Type 2 diabetes mellitus with hyperglycemia: Secondary | ICD-10-CM | POA: Diagnosis not present

## 2016-05-15 LAB — HEMOGLOBIN A1C: HEMOGLOBIN A1C: 7.8 % — AB (ref 4.6–6.5)

## 2016-05-15 MED ORDER — BLOOD GLUCOSE MONITOR KIT: PACK | 0 refills | Status: DC

## 2016-05-15 NOTE — Assessment & Plan Note (Addendum)
We'll continue metformin and Jardiance. Check A1c today. Discussed getting back to diet and exercise. Discussed monitoring the cut on his right middle toe. If it does not continue to heal or if he develops any signs of infection he will be evaluated immediately.

## 2016-05-15 NOTE — Progress Notes (Signed)
Pre visit review using our clinic review tool, if applicable. No additional management support is needed unless otherwise documented below in the visit note. 

## 2016-05-15 NOTE — Assessment & Plan Note (Signed)
Above goal today. Has been at goal previously and reports near goal at home. Discussed options of increasing one of his medications versus monitoring at home and contacting us in a week to let us know what it has been running. Patient declined increasing medication and will check his blood pressure daily at home. Discussed goal of less than 130/80 given his cardiac history. If consistently greater than this at home we will increase one of his medications.

## 2016-05-15 NOTE — Patient Instructions (Signed)
Nice to see you. We will check some lab work today and call you with the results. Please continue your blood pressure medication and diabetes medications. Please monitor your blood pressure at home. Your goal is less than 130/80. Check your blood pressure daily after resting for 10-15 minutes. If it is consistently greater than 130/80 please contact the office. Please call us in a week and let us know what you're numbers up and running. Please do relative rest for your back. You can use heat and Tylenol for this. If you develop numbness, weakness, loss of bowel or bladder function, numbness between your legs, fevers, chest pain or shortness of breath, or any new or changing symptoms please seek medical attention.

## 2016-05-15 NOTE — Assessment & Plan Note (Signed)
Suspect symptoms are related to back strain. Is slowly improving. Benign exam today. Neurologically intact. No red flags. Discussed heat and Tylenol for this. Continue to monitor over the next 1-2 weeks. If not improving at that time he'll contact us and we will obtain an x-ray. Given return precautions.

## 2016-05-15 NOTE — Assessment & Plan Note (Signed)
Noted on prior lab work. Has been elevated in the past. We will recheck this today.

## 2016-05-15 NOTE — Progress Notes (Signed)
Eric Sonnenberg, MD Phone: [phone]  [name] is a 65 y.o. male who presents today for f/u.  DIABETES Disease Monitoring: Blood Sugar ranges-not checking Polyuria/phagia/dipsia- no      Medications: Compliance- taking metformin, Jardiance Hypoglycemic symptoms- only if he does not eat something for a long period of time, he eats something and feels improved Had been going to the gym and working on his diet though notes over the last several weeks has been on a holiday diet.  Hypertension: Checks his blood pressure at home sometimes. Typically in the 130s over 70s. No chest pain or shortness of breath. Does take Coreg, ramipril. Also taking Effient, Lipitor, Ranexa, aspirin, and niacin for his CAD. No edema.  Low back pain: Patient notes about a month ago his back went out. He does not know why. He wasn't doing anything specifically when this occurred. Notes the pain is on the right side greater than the left. Occasionally goes down the right side of his right leg. Hurts when he tries to stand up at times. Is overall improving over the last several weeks. No numbness or weakness. No saddle anesthesia or bowel or bladder incontinence. No fevers. Did take an occasional hydrocodone that he had left over from previously with some benefit.   PMH: Former smoker   ROS see history of present illness  Objective  Physical Exam Vitals:   05/15/16 0959  BP: (!) 150/80  Pulse: 84  Temp: 98.2 F (36.8 C)    BP Readings from Last 3 Encounters:  05/15/16 (!) 150/80  04/07/16 126/72  02/07/16 116/78   Wt Readings from Last 3 Encounters:  05/15/16 199 lb (90.3 kg)  04/07/16 201 lb 12 oz (91.5 kg)  02/07/16 202 lb 4 oz (91.7 kg)    Physical Exam  Constitutional: No distress.  Cardiovascular: Normal rate, regular rhythm and normal heart sounds.   Pulmonary/Chest: Effort normal and breath sounds normal.  Musculoskeletal:  No midline spine tenderness, no midline spine  step-off, no muscular back tenderness, no skin changes in the lower back  Neurological: He is alert. Gait normal.  5 out of 5 strength bilateral quads, hamstrings, plantar flexion, and dorsiflexion, sensation light touch intact in bilateral lower extremities, 2+ patellar reflexes  Skin: He is not diaphoretic.   Diabetic Foot Exam - Simple   Simple Foot Form Diabetic Foot exam was performed with the following findings:  Yes 05/15/2016 10:42 AM  Visual Inspection See comments:  Yes Sensation Testing Intact to touch and monofilament testing bilaterally:  Yes Pulse Check Posterior Tibialis and Dorsalis pulse intact bilaterally:  Yes Comments Right middle toe medial aspect with small cut that the patient reports has been there for 3 days after he tried to remove part of his toenail with his thumbnail, no surrounding erythema or drainage or tenderness, otherwise no deformities, ulcerations, or skin breakdown in his feet     Assessment/Plan: Please see individual problem list.  HTN (hypertension) Above goal today. Has been at goal previously and reports near goal at home. Discussed options of increasing one of his medications versus monitoring at home and contacting us in a week to let us know what it has been running. Patient declined increasing medication and will check his blood pressure daily at home. Discussed goal of less than 130/80 given his cardiac history. If consistently greater than this at home we will increase one of his medications.  Diabetes mellitus type 2, uncontrolled, without complications (HCC) We'll continue metformin and Jardiance. Check   A1c today. Discussed getting back to diet and exercise. Discussed monitoring the cut on his right middle toe. If it does not continue to heal or if he develops any signs of infection he will be evaluated immediately.  Low back pain Suspect symptoms are related to back strain. Is slowly improving. Benign exam today. Neurologically intact. No  red flags. Discussed heat and Tylenol for this. Continue to monitor over the next 1-2 weeks. If not improving at that time he'll contact us and we will obtain an x-ray. Given return precautions.  Elevated bilirubin Noted on prior lab work. Has been elevated in the past. We will recheck this today.   Orders Placed This Encounter  Procedures  . HgB A1c  . Bilirubin,Direct/Indirect(Fractionated)    Meds ordered this encounter  Medications  . blood glucose meter kit and supplies KIT    Sig: Dispense based on patient and insurance preference. Use once daily fasting. (FOR ICD 10 DIAGNOSIS OF E11.59).    Dispense:  1 each    Refill:  0    Order Specific Question:   Number of strips    Answer:   100    Order Specific Question:   Number of lancets    Answer:   100    Eric Sonnenberg, MD [facility] [place]  

## 2016-05-19 LAB — BILIRUBIN,DIRECT & INDIRECT (FRACTIONATED)
BILIRUBIN INDIRECT: 1 mg/dL (ref 0.2–1.2)
Bilirubin, Direct: 0.3 mg/dL — ABNORMAL HIGH (ref <=0.2)

## 2016-05-19 LAB — BILIRUBIN, TOTAL: BILIRUBIN TOTAL: 1.3 mg/dL — AB (ref 0.2–1.2)

## 2016-05-20 ENCOUNTER — Other Ambulatory Visit: Payer: Self-pay | Admitting: Family Medicine

## 2016-05-20 ENCOUNTER — Telehealth: Payer: Self-pay | Admitting: Family Medicine

## 2016-05-20 ENCOUNTER — Other Ambulatory Visit: Payer: Self-pay | Admitting: Cardiovascular Disease

## 2016-05-20 DIAGNOSIS — I25119 Atherosclerotic heart disease of native coronary artery with unspecified angina pectoris: Secondary | ICD-10-CM

## 2016-05-20 NOTE — Telephone Encounter (Signed)
See result message.

## 2016-05-20 NOTE — Telephone Encounter (Signed)
Pt called back returning your call regards labs. He stated that he will definitely answer his phone for the next hour and a half.  Call pt @ [phone]

## 2016-05-21 ENCOUNTER — Other Ambulatory Visit: Payer: Self-pay | Admitting: Family Medicine

## 2016-05-22 ENCOUNTER — Telehealth: Payer: Self-pay

## 2016-05-22 DIAGNOSIS — R17 Unspecified jaundice: Secondary | ICD-10-CM

## 2016-05-22 NOTE — Telephone Encounter (Signed)
Patient checked with insurance and states he would like to proceed with the ultrasound.

## 2016-05-23 MED ORDER — EMPAGLIFLOZIN 25 MG PO TABS: 25.0000 mg | ORAL_TABLET | Freq: Every day | ORAL | 0 refills | Status: DC

## 2016-05-23 NOTE — Telephone Encounter (Signed)
Patient notified and per Dr.Sonnenberg I sent in rx for jardiance 25mg 

## 2016-05-23 NOTE — Telephone Encounter (Signed)
Ultrasound ordered. Lab work for this would include hepatitis serologies, iron levels, and ceruloplasmin. Please inform the patient. Thanks.

## 2016-05-26 ENCOUNTER — Telehealth: Payer: Self-pay | Admitting: Radiology

## 2016-05-26 DIAGNOSIS — R17 Unspecified jaundice: Secondary | ICD-10-CM

## 2016-05-26 NOTE — Telephone Encounter (Signed)
Orders placed.

## 2016-05-26 NOTE — Telephone Encounter (Signed)
Pt coming in for labs tomorrow, please place orders. Thank you 

## 2016-05-26 NOTE — Addendum Note (Signed)
Addended by: [name] on: 05/26/2016 05:46 PM   Modules accepted: Orders

## 2016-05-27 ENCOUNTER — Other Ambulatory Visit (INDEPENDENT_AMBULATORY_CARE_PROVIDER_SITE_OTHER): Payer: Medicare Other

## 2016-05-27 DIAGNOSIS — R17 Unspecified jaundice: Secondary | ICD-10-CM | POA: Diagnosis not present

## 2016-05-27 LAB — IRON,TIBC AND FERRITIN PANEL
%SAT: 25 % (ref 15–60)
FERRITIN: 135 ng/mL (ref 20–380)
Iron: 78 ug/dL (ref 50–180)
TIBC: 317 ug/dL (ref 250–425)

## 2016-05-28 LAB — HEPATITIS A ANTIBODY, TOTAL: HEP A TOTAL AB: NONREACTIVE

## 2016-05-28 LAB — HEPATITIS B CORE ANTIBODY, TOTAL: Hep B Core Total Ab: NONREACTIVE

## 2016-05-28 LAB — HEPATITIS B SURFACE ANTIGEN: Hepatitis B Surface Ag: NEGATIVE

## 2016-05-28 LAB — HEPATITIS C ANTIBODY: HCV AB: NEGATIVE

## 2016-05-28 LAB — HEPATITIS B SURFACE ANTIBODY,QUALITATIVE: HEP B S AB: NEGATIVE

## 2016-05-29 LAB — CERULOPLASMIN: CERULOPLASMIN: 28 mg/dL (ref 18–36)

## 2016-06-09 ENCOUNTER — Ambulatory Visit: Payer: Medicare Other | Attending: Family Medicine

## 2016-06-19 ENCOUNTER — Other Ambulatory Visit: Payer: Self-pay | Admitting: Cardiovascular Disease

## 2016-06-19 DIAGNOSIS — I25119 Atherosclerotic heart disease of native coronary artery with unspecified angina pectoris: Secondary | ICD-10-CM

## 2016-07-04 ENCOUNTER — Other Ambulatory Visit: Payer: Self-pay | Admitting: Cardiovascular Disease

## 2016-07-04 DIAGNOSIS — I25119 Atherosclerotic heart disease of native coronary artery with unspecified angina pectoris: Secondary | ICD-10-CM

## 2016-07-14 ENCOUNTER — Telehealth: Payer: Self-pay | Admitting: Family Medicine

## 2016-07-14 NOTE — Telephone Encounter (Signed)
Helena from [employer] called and needs last office notes that discusses CPAP. Please advise, thank you!  Call @ [phone] Fax @ [phone]

## 2016-07-14 NOTE — Telephone Encounter (Signed)
I do not believe I have ever discussed sleep apnea with the patient. There is no evidence of an order in the chart for CPAP for sleep study. Please see if they need anything else from [place]. Thanks.

## 2016-07-14 NOTE — Telephone Encounter (Signed)
I do not see one in chart, please advise

## 2016-07-15 NOTE — Telephone Encounter (Signed)
Called helena, was told I would be called back

## 2016-07-17 NOTE — Telephone Encounter (Signed)
Pt called and wanted to know what we can do in order for him to get his CPAP supplies. He states that he was diagnosed and treated while living in [place] moved in 2007 to [place]  and was unable to supplies from previous doctor in [place], but now he has switch to Medicare and they have different requirements. Please advise, thank you!  Call pt @ [phone].

## 2016-07-17 NOTE — Telephone Encounter (Signed)
Informed her we do not have record of this.

## 2016-07-18 NOTE — Telephone Encounter (Signed)
It would likely require a new sleep study. I can place an order for this if he would like. Thanks.

## 2016-07-18 NOTE — Telephone Encounter (Signed)
Please advise 

## 2016-07-21 NOTE — Telephone Encounter (Signed)
It appears the home health agency requested office notes discussing sleep apnea. Since we have not discussed this please get the patient set up for an office visit so we can discuss this further and get his supplies ordered.

## 2016-07-21 NOTE — Telephone Encounter (Signed)
Patient states he would like to just request his records from is old sleep study, he does not want to have a new sleep study

## 2016-07-22 NOTE — Telephone Encounter (Signed)
Patient is scheduled for office visit

## 2016-07-30 ENCOUNTER — Ambulatory Visit (INDEPENDENT_AMBULATORY_CARE_PROVIDER_SITE_OTHER): Payer: Medicare Other | Admitting: Family Medicine

## 2016-07-30 ENCOUNTER — Encounter: Payer: Self-pay | Admitting: Family Medicine

## 2016-07-30 VITALS — BP 110/70 | HR 86 | Temp 98.3°F | Wt 205.2 lb

## 2016-07-30 DIAGNOSIS — G4733 Obstructive sleep apnea (adult) (pediatric): Secondary | ICD-10-CM | POA: Insufficient documentation

## 2016-07-30 DIAGNOSIS — I25119 Atherosclerotic heart disease of native coronary artery with unspecified angina pectoris: Secondary | ICD-10-CM

## 2016-07-30 DIAGNOSIS — R17 Unspecified jaundice: Secondary | ICD-10-CM | POA: Diagnosis not present

## 2016-07-30 DIAGNOSIS — IMO0001 Reserved for inherently not codable concepts without codable children: Secondary | ICD-10-CM

## 2016-07-30 DIAGNOSIS — E1165 Type 2 diabetes mellitus with hyperglycemia: Secondary | ICD-10-CM | POA: Diagnosis not present

## 2016-07-30 MED ORDER — METFORMIN HCL 1000 MG PO TABS: 1000.0000 mg | ORAL_TABLET | Freq: Two times a day (BID) | ORAL | 3 refills | Status: DC

## 2016-07-30 NOTE — Assessment & Plan Note (Addendum)
Control seems to be fairly good at home. He is not quite due for an A1c and he will return in 3 weeks for this. Continue current medications. He was advised to see an ophthalmologist for yearly diabetic eye exam.

## 2016-07-30 NOTE — Progress Notes (Signed)
  [name], MD Phone: [phone]  [name] is a 66 y.o. male who presents today for follow-up.  OSA: Patient notes he was diagnosed in 2001 or 2002. He had a sleep study. He was unable to get a hold of the report for the sleep study. He's been using CPAP nightly since then. Uses it 5-8 hours nightly. Wakes up mostly well rested. No hypersomnia during the day.  Diabetes: Blood sugars typically run 90-150. Taking Jardiance and metformin. No polyuria. No polydipsia. Occasionally gets "hangry" has not checked her sugar when this occurs to see if that is low. Has not seen an ophthalmologist in greater than a year.  Elevated bilirubin: Patient never got the ultrasound. He notes no alcohol intake. Rare ibuprofen PM. No Tylenol. No right upper quadrant pain.  PMH: Former smoker   ROS see history of present illness  Objective  Physical Exam Vitals:   07/30/16 1122  BP: 110/70  Pulse: 86  Temp: 98.3 F (36.8 C)    BP Readings from Last 3 Encounters:  07/30/16 110/70  05/15/16 (!) 150/80  04/07/16 126/72   Wt Readings from Last 3 Encounters:  07/30/16 205 lb 3.2 oz (93.1 kg)  05/15/16 199 lb (90.3 kg)  04/07/16 201 lb 12 oz (91.5 kg)    Physical Exam  Constitutional: No distress.  Cardiovascular: Normal rate, regular rhythm and normal heart sounds.   Pulmonary/Chest: Effort normal and breath sounds normal.  Abdominal: Soft. Bowel sounds are normal. He exhibits no distension. There is no tenderness. There is no rebound and no guarding.  Neurological: He is alert. Gait normal.  Skin: Skin is warm and dry. He is not diaphoretic.     Assessment/Plan: Please see individual problem list.  Diabetes mellitus type 2, uncontrolled, without complications ([place]) Control seems to be fairly good at home. He is not quite due for an A1c and he will return in 3 weeks for this. Continue current medications. He was advised to see an ophthalmologist for yearly diabetic eye  exam.  Elevated bilirubin We will reschedule his right upper quadrant ultrasound. Other lab work has been unrevealing of cause.  OSA (obstructive sleep apnea) Patient with a long history of obstructive sleep apnea. Well-controlled on CPAP. Using it nightly. We will fax this note to Medicare so that he may get supplies.   Orders Placed This Encounter  Procedures  . US Abdomen Limited RUQ    Standing Status:   Future    Standing Expiration Date:   09/29/2017    Order Specific Question:   Reason for Exam (SYMPTOM  OR DIAGNOSIS REQUIRED)    Answer:   elevated bilirubin    Order Specific Question:   Preferred imaging location?    Answer:   [facility]  . Comp Met (CM[facility])    Standing Status:   Future    Standing Expiration Date:   07/30/2017  . HgB A1c    Standing Status:   Future    Standing Expiration Date:   07/30/2017    Meds ordered this encounter  Medications  . metFORMIN (GLUCOPHAGE) 1000 MG tablet    Sig: Take 1 tablet (1,000 mg total) by mouth 2 (two) times daily with a meal.    Dispense:  180 tablet    Refill:  [place], MD [place]

## 2016-07-30 NOTE — Assessment & Plan Note (Signed)
Patient with a long history of obstructive sleep apnea. Well-controlled on CPAP. Using it nightly. We will fax this note to Medicare so that he may get supplies.

## 2016-07-30 NOTE — Assessment & Plan Note (Signed)
We will reschedule his right upper quadrant ultrasound. Other lab work has been unrevealing of cause.

## 2016-07-30 NOTE — Progress Notes (Signed)
Faxed to helena with [facility]

## 2016-07-30 NOTE — Patient Instructions (Signed)
Nice to see you. We will fax my note to Medicare regarding your sleep apnea. We will have you return in a couple weeks for lab work. We'll get you set up for the ultrasound of the liver.

## 2016-07-30 NOTE — Progress Notes (Signed)
Pre visit review using our clinic review tool, if applicable. No additional management support is needed unless otherwise documented below in the visit note. 

## 2016-08-03 ENCOUNTER — Other Ambulatory Visit: Payer: Self-pay | Admitting: Family Medicine

## 2016-08-05 ENCOUNTER — Ambulatory Visit
Admission: RE | Admit: 2016-08-05 | Discharge: 2016-08-05 | Disposition: A | Discharge status: Home / Self Care | Payer: Medicare Other | Source: Ambulatory Visit | Attending: Family Medicine | Admitting: Family Medicine

## 2016-08-05 ENCOUNTER — Telehealth: Payer: Self-pay | Admitting: Family Medicine

## 2016-08-05 DIAGNOSIS — R17 Unspecified jaundice: Secondary | ICD-10-CM

## 2016-08-05 DIAGNOSIS — N1330 Unspecified hydronephrosis: Secondary | ICD-10-CM

## 2016-08-05 DIAGNOSIS — R7989 Other specified abnormal findings of blood chemistry: Secondary | ICD-10-CM | POA: Diagnosis not present

## 2016-08-05 NOTE — Telephone Encounter (Signed)
Called patient regarding ultrasound results. Revealed severe right-sided hydronephrosis. We'll obtain a CT scan. We will additionally refer to urology. He notes no kidney stone symptoms though does have a history of kidney stones in the past. He feels well at this time.

## 2016-08-07 ENCOUNTER — Telehealth: Payer: Self-pay

## 2016-08-07 NOTE — Telephone Encounter (Signed)
Left message to return call 

## 2016-08-07 NOTE — Telephone Encounter (Signed)
Left message to return call to have patient come in for labs today

## 2016-08-07 NOTE — Telephone Encounter (Signed)
Order for CT changed. Will see if patient can come in for kidney function check.

## 2016-08-11 ENCOUNTER — Other Ambulatory Visit (INDEPENDENT_AMBULATORY_CARE_PROVIDER_SITE_OTHER): Payer: Medicare Other

## 2016-08-11 ENCOUNTER — Telehealth: Payer: Self-pay | Admitting: Radiology

## 2016-08-11 ENCOUNTER — Other Ambulatory Visit: Payer: Self-pay | Admitting: Family Medicine

## 2016-08-11 DIAGNOSIS — N1330 Unspecified hydronephrosis: Secondary | ICD-10-CM | POA: Diagnosis not present

## 2016-08-11 LAB — BASIC METABOLIC PANEL
BUN: 30 mg/dL — ABNORMAL HIGH (ref 6–23)
CALCIUM: 9.5 mg/dL (ref 8.4–10.5)
CO2: 26 mEq/L (ref 19–32)
CREATININE: 1.4 mg/dL (ref 0.40–1.50)
Chloride: 104 mEq/L (ref 96–112)
GFR: 53.94 mL/min — ABNORMAL LOW (ref >60.00)
GLUCOSE: 286 mg/dL — AB (ref 70–99)
Potassium: 4 mEq/L (ref 3.5–5.1)
Sodium: 137 mEq/L (ref 135–145)

## 2016-08-11 NOTE — Telephone Encounter (Signed)
Pt came in for labs today, did you want A1C drawn today as well? It was put in as future and was done on 05/15/16.

## 2016-08-11 NOTE — Telephone Encounter (Signed)
No A1c today.

## 2016-08-11 NOTE — Telephone Encounter (Signed)
Patient will have ct scan tomorrow

## 2016-08-11 NOTE — Telephone Encounter (Signed)
Per Jessica-depending on lab results it may be stat.  No appointments have been made for this. I will schedule the CT and let the patient know when it is scheduled. It can be changed to stat if results come back abnormal.

## 2016-08-11 NOTE — Addendum Note (Signed)
Addended by: Caryl Bis, Raziah Funnell G on: 08/11/2016 01:19 PM   Modules accepted: Orders

## 2016-08-11 NOTE — Telephone Encounter (Signed)
Spoke with patient. Advised that we've been trying to get in touch with him regarding setting up lab work. I scheduled him for a lab appointment for today at 245. Orders placed. He notes he has been urinating well with no hematuria though has started to have some right-sided back discomfort that is mild. Given this we will change his CT scan to stat to get this evaluated given the right-sided hydronephrosis that was previously seen. We will have the referral coordinator contact the patient to set up the CT scan.

## 2016-08-11 NOTE — Telephone Encounter (Signed)
Can we check to see if the patient can come in for lab work today or tomorrow? Thanks.

## 2016-08-12 ENCOUNTER — Telehealth: Payer: Self-pay | Admitting: Family Medicine

## 2016-08-12 ENCOUNTER — Ambulatory Visit
Admission: RE | Admit: 2016-08-12 | Discharge: 2016-08-12 | Disposition: A | Discharge status: Home / Self Care | Payer: Medicare Other | Source: Ambulatory Visit | Attending: Family Medicine | Admitting: Family Medicine

## 2016-08-12 DIAGNOSIS — N132 Hydronephrosis with renal and ureteral calculous obstruction: Secondary | ICD-10-CM | POA: Diagnosis not present

## 2016-08-12 DIAGNOSIS — N1330 Unspecified hydronephrosis: Secondary | ICD-10-CM

## 2016-08-12 DIAGNOSIS — K5730 Diverticulosis of large intestine without perforation or abscess without bleeding: Secondary | ICD-10-CM | POA: Diagnosis not present

## 2016-08-12 DIAGNOSIS — K4090 Unilateral inguinal hernia, without obstruction or gangrene, not specified as recurrent: Secondary | ICD-10-CM

## 2016-08-12 NOTE — Telephone Encounter (Signed)
Gen surgery referral placed

## 2016-08-12 NOTE — Telephone Encounter (Signed)
Received CT scan results. 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 Advised of creatinine going from about 1-1.4. Also advised of mild discomfort that patient has a hard time differentiating between his chronic back pain. Dr. Pilar Jarvis noted that this is likely a chronic finding and could wait until his scheduled appointment later this month to be further evaluated.  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 Advised him that I spoke with a urologist and they advised that he can keep his appointment later this month for evaluation. Please also let the patient know that it appears he has a left inguinal hernia. We should consider evaluation with Gen. surgery for this. It also appears that there are some small calcifications in his prostate gland and they should discuss this at his urology appointment.

## 2016-08-12 NOTE — Telephone Encounter (Signed)
Pt called and wanted clarification on whether or not he still needs to come in for his scheduled appt tomorrow. Please advise, thank you!

## 2016-08-12 NOTE — Telephone Encounter (Signed)
Patient notified and would like to have a referral to gen. surgery

## 2016-08-12 NOTE — Telephone Encounter (Signed)
Patient does not need appointment tomorrow

## 2016-08-13 ENCOUNTER — Telehealth: Payer: Self-pay | Admitting: General Surgery

## 2016-08-13 ENCOUNTER — Ambulatory Visit: Payer: Medicare Other | Admitting: Family Medicine

## 2016-08-13 NOTE — Telephone Encounter (Signed)
L/M ON C# TO CALL AN SCHEDULE AN APPOINTMENT WITH DR Marjorie Smolder CALL) FOR INGUINAL HERNIA REF'D BY DR E SONNENBERG/MEDICARE/TRICARE

## 2016-08-14 ENCOUNTER — Encounter: Payer: Self-pay | Admitting: General Surgery

## 2016-08-20 ENCOUNTER — Other Ambulatory Visit (INDEPENDENT_AMBULATORY_CARE_PROVIDER_SITE_OTHER): Payer: Medicare Other

## 2016-08-20 DIAGNOSIS — R17 Unspecified jaundice: Secondary | ICD-10-CM

## 2016-08-20 DIAGNOSIS — E1165 Type 2 diabetes mellitus with hyperglycemia: Secondary | ICD-10-CM

## 2016-08-20 DIAGNOSIS — IMO0001 Reserved for inherently not codable concepts without codable children: Secondary | ICD-10-CM

## 2016-08-20 LAB — COMPREHENSIVE METABOLIC PANEL
ALK PHOS: 67 U/L (ref 39–117)
ALT: 29 U/L (ref 0–53)
AST: 20 U/L (ref 0–37)
Albumin: 4.7 g/dL (ref 3.5–5.2)
BUN: 20 mg/dL (ref 6–23)
CHLORIDE: 104 meq/L (ref 96–112)
CO2: 27 mEq/L (ref 19–32)
Calcium: 9.8 mg/dL (ref 8.4–10.5)
Creatinine, Ser: 1.11 mg/dL (ref 0.40–1.50)
GFR: 70.5 mL/min (ref >60.00)
GLUCOSE: 261 mg/dL — AB (ref 70–99)
POTASSIUM: 4.2 meq/L (ref 3.5–5.1)
SODIUM: 139 meq/L (ref 135–145)
TOTAL PROTEIN: 7.1 g/dL (ref 6.0–8.3)
Total Bilirubin: 1.2 mg/dL (ref 0.2–1.2)

## 2016-08-20 LAB — HEMOGLOBIN A1C: HEMOGLOBIN A1C: 8.2 % — AB (ref 4.6–6.5)

## 2016-08-21 ENCOUNTER — Ambulatory Visit: Payer: Self-pay | Admitting: General Surgery

## 2016-08-22 ENCOUNTER — Encounter: Payer: Self-pay | Admitting: General Surgery

## 2016-08-22 ENCOUNTER — Ambulatory Visit (INDEPENDENT_AMBULATORY_CARE_PROVIDER_SITE_OTHER): Payer: Medicare Other | Admitting: General Surgery

## 2016-08-22 VITALS — Wt 207.0 lb

## 2016-08-22 DIAGNOSIS — K4090 Unilateral inguinal hernia, without obstruction or gangrene, not specified as recurrent: Secondary | ICD-10-CM

## 2016-08-22 DIAGNOSIS — I25119 Atherosclerotic heart disease of native coronary artery with unspecified angina pectoris: Secondary | ICD-10-CM | POA: Diagnosis not present

## 2016-08-22 NOTE — Patient Instructions (Signed)
Patient to call after his appointment with Dr. Ernestine Conrad.   Inguinal Hernia, Adult An inguinal hernia is when fat or the intestines push through the area where the leg meets the lower abdomen (groin) and create a rounded lump (bulge). This condition develops over time. There are three types of inguinal hernias. These types include:  Hernias that can be pushed back into the belly (are reducible).  Hernias that are not reducible (are incarcerated).  Hernias that are not reducible and lose their blood supply (are strangulated). This type of hernia requires emergency surgery. What are the causes? This condition is caused by having a weak spot in the muscles or tissue. This weakness lets the hernia poke through. This condition can be triggered by:  Suddenly straining the muscles of the lower abdomen.  Lifting heavy objects.  Straining to have a bowel movement. Difficult bowel movements (constipation) can lead to this.  Coughing. What increases the risk? This condition is more likely to develop in:  Men.  Pregnant women.  People who:  Are overweight.  Work in jobs that require long periods of standing or heavy lifting.  Have had an inguinal hernia before.  Smoke or have lung disease. These factors can lead to long-lasting (chronic) coughing. What are the signs or symptoms? Symptoms can depend on the size of the hernia. Often, a small inguinal hernia has no symptoms. Symptoms of a larger hernia include:  A lump in the groin. This is easier to see when the person is standing. It might not be visible when he or she is lying down.  Pain or burning in the groin. This occurs especially when lifting, straining, or coughing.  A dull ache or a feeling of pressure in the groin.  A lump in the scrotum in men. Symptoms of a strangulated inguinal hernia can include:  A bulge in the groin that is very painful and tender to the touch.  A bulge that turns red or purple.  Fever, nausea,  and vomiting.  The inability to have a bowel movement or to pass gas. How is this diagnosed? This condition is diagnosed with a medical history and physical exam. Your health care provider may feel your groin area and ask you to cough. How is this treated? Treatment for this condition varies depending on the size of your hernia and whether you have symptoms. If you do not have symptoms, your health care provider may have you watch your hernia carefully and come in for follow-up visits. If your hernia is larger or if you have symptoms, your treatment will include surgery. Follow these instructions at home: Lifestyle   Drink enough fluid to keep your urine clear or pale yellow.  Eat a diet that includes a lot of fiber. Eat plenty of fruits, vegetables, and whole grains. Talk with your health care provider if you have questions.  Avoid lifting heavy objects.  Avoid standing for long periods of time.  Do not use tobacco products, including cigarettes, chewing tobacco, or e-cigarettes. If you need help quitting, ask your health care provider.  Maintain a healthy weight. General instructions   Do not try to force the hernia back in.  Watch your hernia for any changes in color or size. Let your health care provider know if any changes occur.  Take over-the-counter and prescription medicines only as told by your health care provider.  Keep all follow-up visits as told by your health care provider. This is important. Contact a health care provider if:  You have a fever.  You have new symptoms.  Your symptoms get worse. Get help right away if:  You have pain in the groin that suddenly gets worse.  A bulge in the groin gets bigger suddenly and does not go down.  You are a man and you have a sudden pain in the scrotum, or the size of your scrotum suddenly changes.  A bulge in the groin area becomes red or purple and is painful to the touch.  You have nausea or vomiting that does  not go away.  You feel your heart beating a lot more quickly than normal.  You cannot have a bowel movement or pass gas. This information is not intended to replace advice given to you by your health care provider. Make sure you discuss any questions you have with your health care provider. Document Released: 09/14/2008 Document Revised: 10/04/2015 Document Reviewed: 03/08/2014 Elsevier Interactive Patient Education  2017 [employer].

## 2016-08-22 NOTE — Progress Notes (Signed)
Patient ID: [name], male   DOB: [birth year], 66 y.o.   MRN: [id number]  Chief Complaint  Patient presents with  . Other    HPI [name] is a 66 y.o. male here today for evaluation of a left inguinal hernia. He states he had a ct scan done to evaluate an enlarged right kidney, which incidentally showed a left inguinal hernia. He has no pain, has never noticed any bulging, and it able to move his bowels. HPI  Past Medical History:  Diagnosis Date  . Angina   . CAD (coronary artery disease) 04/12/2011   a. remote MI in 1996; b. MI 1997 s/p stenting to LAD & diag; c. MI 2009: long stenting from pLAD to mid LAD and diag & POBA of jailed diag branch; d. cath 2010: 80% ISR of LAD w/ L-R collats, med Rx; e. cath 2012: 50-60% tub mLAD, 60-70% dLAD, FFR 0.75 -->s/p PCI dissection repaired w/ 2 stents (3 total); f. cath 06/2012: no changes from 2012 cath, med Rx, patent stents   . Diabetes mellitus without complication ([place])   . ED (erectile dysfunction)   . HTN (hypertension) 04/12/2011  . Hyperlipemia 04/12/2011  . Ischemic cardiomyopathy    a. echo 2010: EF 45-50%, mild to mod ant and apical wall HK, trace MR; b. cardiac cath 06/2012: EF 40%, mild MR   . Myocardial infarction    x 5  . Obesity   . OSA (obstructive sleep apnea)    on CPAP  . Osteoarthritis     Past Surgical History:  Procedure Laterality Date  . CORONARY ANGIOPLASTY    . CORONARY STENT PLACEMENT     x 7  . LEFT HEART CATHETERIZATION WITH CORONARY ANGIOGRAM N/A 04/14/2011   Procedure: LEFT HEART CATHETERIZATION WITH CORONARY ANGIOGRAM;  Surgeon: Pixie Casino, MD;  Location: [facility] CATH LAB;  Service: Cardiovascular;  Laterality: N/A;  . LEFT HEART CATHETERIZATION WITH CORONARY ANGIOGRAM N/A 06/25/2012   Procedure: LEFT HEART CATHETERIZATION WITH CORONARY ANGIOGRAM;  Surgeon: Peter M Martinique, MD;  Location: [facility] CATH LAB;  Service: Cardiovascular;  Laterality: N/A;  . TONSILLECTOMY AND ADENOIDECTOMY       Family History  Problem Relation Age of Onset  . COPD Mother   . Other Father     muscle tumor  . Stroke Father   . Prostate cancer Father     mets  . Heart disease Father   . Stroke Sister     Social History Social History  Substance Use Topics  . Smoking status: Former Smoker    Types: Cigarettes    Quit date: 09/14/2007  . Smokeless tobacco: Never Used  . Alcohol use 0.0 oz/week     Comment: rarely    Allergies  Allergen Reactions  . Atarax [Hydroxyzine Hcl]     Weakness, out of this world feeling.  . Latex Rash    Other reaction(s): Other (See Comments) I.e. Elastic= rash to blisters if worn for an extended time Patient show effects after long exposure.    Current Outpatient Prescriptions  Medication Sig Dispense Refill  . aspirin 325 MG tablet Take 325 mg by mouth every morning.    Marland Kitchen atorvastatin (LIPITOR) 80 MG tablet Take 1 tablet (80 mg total) by mouth daily. 90 tablet 3  . blood glucose meter kit and supplies KIT Dispense based on patient and insurance preference. Use once daily fasting. (FOR ICD 10 DIAGNOSIS OF E11.59). 1 each 0  . carvedilol (COREG) 6.25 MG tablet  TAKE 1 TABLET TWICE A DAY WITH MEALS (PLEASE CALL OUR OFFICE TO SCHEDULE AN 6 MONTHS APPOINTMENT FOR FUTURE REFILLS. [phone]) 60 tablet 0  . JARDIANCE 25 MG TABS tablet TAKE 1 TABLET DAILY 90 tablet 0  . Lactobacillus (ACIDOPHILUS PROBIOTIC) 100 MG CAPS Take 1 capsule (100 mg total) by mouth daily. (Patient taking differently: Take 100 mg by mouth daily as needed. ) 15 capsule 0  . metFORMIN (GLUCOPHAGE) 1000 MG tablet Take 1 tablet (1,000 mg total) by mouth 2 (two) times daily with a meal. 180 tablet 3  . Multiple Vitamins-Minerals (MULTIVITAMINS THER. W/MINERALS) TABS Take 1 tablet by mouth every morning.     . niacin (NIASPAN) 1000 MG CR tablet TAKE 2 TABLETS (2,000 MG TOTAL) AT BEDTIME, PLEASE CALL CARDIOLOGY OFFICE TO SCHEDULE A 6 MONTH APPOINTMENT FOR FUTURE REFILLS, [phone]  tablet 0  . nitroGLYCERIN (NITROLINGUAL) 0.4 MG/SPRAY spray Place 1 spray under the tongue every 5 (five) minutes as needed for chest pain. 12 g 12  . prasugrel (EFFIENT) 10 MG TABS tablet Take 1 tablet (10 mg total) by mouth daily. 90 tablet 3  . ramipril (ALTACE) 2.5 MG capsule TAKE 1 CAPSULE EVERY MORNING 90 capsule 3  . ranolazine (RANEXA) 1000 MG SR tablet Take 1 tablet (1,000 mg total) by mouth 2 (two) times daily. 180 tablet 3  . sildenafil (VIAGRA) 100 MG tablet Take 1 tablet (100 mg total) by mouth daily as needed for erectile dysfunction. 7 tablet 5   No current facility-administered medications for this visit.     Review of Systems Review of Systems  Constitutional: Negative.   Respiratory: Negative.   Cardiovascular: Negative.     Weight 207 lb (93.9 kg).  Physical Exam Physical Exam  Constitutional: He is oriented to person, place, and time. He appears well-developed and well-nourished.  Eyes: Conjunctivae are normal. No scleral icterus.  Neck: Neck supple.  Cardiovascular: Normal rate, regular rhythm and normal heart sounds.   Pulmonary/Chest: Effort normal and breath sounds normal.  Abdominal: Soft. Normal appearance and bowel sounds are normal. There is no hepatomegaly. There is no tenderness. A hernia is present. Hernia confirmed positive in the left inguinal area.  Lymphadenopathy:    He has no cervical adenopathy.  Neurological: He is alert and oriented to person, place, and time.  Skin: Skin is warm and dry.  Left inguinal hernia is small and reducible and nontender.   Data Reviewed CT scan small left inguinal hernia, in addition to moderate hydronephrosis on the right with suspected stenosis in right UPJ.   Assessment    CT scan and physical exam showed small left inguinal hernia. It maybe need to be repaired in the future, but is not urgent. Patient has a right ureter obstruction that needs to be addressed first.     Plan     Patient to call after  resolution of right ureter obstruction. He has a history of previous urethral stents.  He has scheduled appointment with urology on 08/28/2016.   HPI, Physical Exam, Assessment and Plan have been scribed under the direction and in the presence of Mckinley Jewel, MD  Gaspar Cola, CMA  [facility] 08/22/2016, 10:57 AM

## 2016-08-27 NOTE — Progress Notes (Signed)
08/28/2016 11:23 AM   [name] [name] [birth year] [id number]  Referring provider: [name], MD [place] [address] [place], [place] [address]  Chief Complaint  Patient presents with  . New Patient (Initial Visit)    hydronephrosis referred by Dr. Josephina Gip    HPI: Patient is a 66 year old Caucasian male who is referred by Dr. Caryl Bis for right hydronephrosis.  Patient underwent an abdominal ultrasound in March 2018 for the evaluation for an elevated bilirubin and he was found to have a new finding of right hydronephrosis.  Of note, patient had a abdominal ultrasound in 2012 which noted normal bilateral kidneys.    CT Renal stone study performed on 08/12/2016 noted moderate right hydronephrosis.  Significant dilatation of the right extrarenal pelvis. The right ureter is small caliber collapsed. The UPJ is visualized in coronal image 69. 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 No definite mass or obstructive stone is noted on this unenhanced scan.  Further evaluation and correlation with urology exam is recommended. There is bilateral nonobstructive nephrolithiasis. Bilateral ureter is small caliber without evidence of calcified ureteral calculi.  No calcified calculi are noted within urinary bladder.  No small bowel or colonic obstruction. Normal appendix. No pericecal inflammation.  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  Colonic diverticula are noted descending colon and sigmoid colon.  No evidence of acute diverticulitis or colitis.  I have independently reviewed the films.    Patient has been experiencing some mild low back pain for several months, but he denies any acute pain.  He has not had fevers, chills, nausea or vomiting.  He denies any gross hematuria or recent passage of stones.  He does have a remote history of nephrolithiasis and had undergone ureteroscopy in the 90's.    His baseline urinary symptoms consist of  frequency, nocturia and a weak urinary stream.  He also has issues with erectile dysfunction. He has taken sildenafil for this condition with good effects.  His UA today was unremarkable.  His father was diagnosed with prostate cancer but lived to the age of 60 and died of strokes.  PSA was 1.24 ng/mL on 09/01/2013.  Cr 1.05 on 08/28/2016.    PMH: Past Medical History:  Diagnosis Date  . Angina   . Asthma   . CAD (coronary artery disease) 04/12/2011   a. remote MI in 1996; b. MI 1997 s/p stenting to LAD & diag; c. MI 2009: long stenting from pLAD to mid LAD and diag & POBA of jailed diag branch; d. cath 2010: 80% ISR of LAD w/ L-R collats, med Rx; e. cath 2012: 50-60% tub mLAD, 60-70% dLAD, FFR 0.75 -->s/p PCI dissection repaired w/ 2 stents (3 total); f. cath 06/2012: no changes from 2012 cath, med Rx, patent stents   . Diabetes mellitus without complication ([place])   . ED (erectile dysfunction)   . Heart disease   . HTN (hypertension) 04/12/2011  . Hyperlipemia 04/12/2011  . Ischemic cardiomyopathy    a. echo 2010: EF 45-50%, mild to mod ant and apical wall HK, trace MR; b. cardiac cath 06/2012: EF 40%, mild MR   . Myocardial infarction ([place])    x 5  . Obesity   . OSA (obstructive sleep apnea)    on CPAP  . Osteoarthritis     Surgical History: Past Surgical History:  Procedure Laterality Date  . CORONARY ANGIOPLASTY     x 5  . CORONARY STENT PLACEMENT     x  7  . LEFT HEART CATHETERIZATION WITH CORONARY ANGIOGRAM N/A 04/14/2011   Procedure: LEFT HEART CATHETERIZATION WITH CORONARY ANGIOGRAM;  Surgeon: Pixie Casino, MD;  Location: [facility] CATH LAB;  Service: Cardiovascular;  Laterality: N/A;  . LEFT HEART CATHETERIZATION WITH CORONARY ANGIOGRAM N/A 06/25/2012   Procedure: LEFT HEART CATHETERIZATION WITH CORONARY ANGIOGRAM;  Surgeon: Peter M Martinique, MD;  Location: [facility] CATH LAB;  Service: Cardiovascular;  Laterality: N/A;  . TONSILLECTOMY AND ADENOIDECTOMY  1955    Home Medications:    Allergies as of 08/28/2016      Reactions   Atarax [hydroxyzine Hcl]    Weakness, out of this world feeling.   Latex Rash   Other reaction(s): Other (See Comments) I.e. Elastic= rash to blisters if worn for an extended time Patient show effects after long exposure.      Medication List       Accurate as of 08/28/16 11:23 AM. Always use your most recent med list.          ACIDOPHILUS PROBIOTIC 100 MG Caps Take 1 capsule (100 mg total) by mouth daily.   aspirin 325 MG tablet Take 325 mg by mouth every morning.   atorvastatin 80 MG tablet Commonly known as:  LIPITOR Take 1 tablet (80 mg total) by mouth daily.   blood glucose meter kit and supplies Kit Dispense based on patient and insurance preference. Use once daily fasting. (FOR ICD 10 DIAGNOSIS OF E11.59).   carvedilol 6.25 MG tablet Commonly known as:  COREG TAKE 1 TABLET TWICE A DAY WITH MEALS (PLEASE CALL OUR OFFICE TO SCHEDULE AN 6 MONTHS APPOINTMENT FOR FUTURE REFILLS. [phone])   JARDIANCE 25 MG Tabs tablet Generic drug:  empagliflozin TAKE 1 TABLET DAILY   metFORMIN 1000 MG tablet Commonly known as:  GLUCOPHAGE Take 1 tablet (1,000 mg total) by mouth 2 (two) times daily with a meal.   multivitamins ther. w/minerals Tabs tablet Take 1 tablet by mouth every morning.   niacin 1000 MG CR tablet Commonly known as:  NIASPAN TAKE 2 TABLETS (2,000 MG TOTAL) AT BEDTIME, PLEASE CALL CARDIOLOGY OFFICE TO SCHEDULE A 6 MONTH APPOINTMENT FOR FUTURE REFILLS, [phone]   nitroGLYCERIN 0.4 MG/SPRAY spray Commonly known as:  NITROLINGUAL Place 1 spray under the tongue every 5 (five) minutes as needed for chest pain.   prasugrel 10 MG Tabs tablet Commonly known as:  EFFIENT Take 1 tablet (10 mg total) by mouth daily.   ramipril 2.5 MG capsule Commonly known as:  ALTACE TAKE 1 CAPSULE EVERY MORNING   ranolazine 1000 MG SR tablet Commonly known as:  RANEXA Take 1 tablet (1,000 mg total) by mouth 2 (two) times  daily.   sildenafil 100 MG tablet Commonly known as:  VIAGRA Take 1 tablet (100 mg total) by mouth daily as needed for erectile dysfunction.       Allergies:  Allergies  Allergen Reactions  . Atarax [Hydroxyzine Hcl]     Weakness, out of this world feeling.  . Latex Rash    Other reaction(s): Other (See Comments) I.e. Elastic= rash to blisters if worn for an extended time Patient show effects after long exposure.    Family History: Family History  Problem Relation Age of Onset  . COPD Mother   . Other Father     muscle tumor  . Stroke Father   . Prostate cancer Father     mets  . Heart disease Father   . Alcoholism Father   . Stroke Sister   .  Bladder Cancer Neg Hx   . Kidney cancer Neg Hx     Social History:  reports that he quit smoking about 8 years ago. His smoking use included Cigarettes. He has never used smokeless tobacco. He reports that he does not drink alcohol or use drugs.  ROS: UROLOGY Frequent Urination?: Yes Hard to postpone urination?: No Burning/pain with urination?: No Get up at night to urinate?: Yes Leakage of urine?: No Urine stream starts and stops?: No Trouble starting stream?: No Do you have to strain to urinate?: No Blood in urine?: No Urinary tract infection?: No Sexually transmitted disease?: No Injury to kidneys or bladder?: No Painful intercourse?: No Weak stream?: Yes Erection problems?: Yes Penile pain?: No  Gastrointestinal Nausea?: No Vomiting?: No Indigestion/heartburn?: No Diarrhea?: No Constipation?: No  Constitutional Fever: No Night sweats?: Yes Weight loss?: No Fatigue?: No  Skin Skin rash/lesions?: No Itching?: No  Eyes Blurred vision?: No Double vision?: No  Ears/Nose/Throat Sore throat?: No Sinus problems?: No  Hematologic/Lymphatic Swollen glands?: No Easy bruising?: Yes  Cardiovascular Leg swelling?: No Chest pain?: No  Respiratory Cough?: No Shortness of breath?:  No  Endocrine Excessive thirst?: No  Musculoskeletal Back pain?: Yes Joint pain?: No  Neurological Headaches?: No Dizziness?: No  Psychologic Depression?: No Anxiety?: No  Physical Exam: BP 124/75   Pulse 91   Ht 5' 8" (1.727 m)   Wt 201 lb 12.8 oz (91.5 kg)   BMI 30.68 kg/m   Constitutional: Well nourished. Alert and oriented, No acute distress. HEENT: [place] AT, moist mucus membranes. Trachea midline, no masses. Cardiovascular: No clubbing, cyanosis, or edema. Respiratory: Normal respiratory effort, no increased work of breathing. GI: Abdomen is soft, non tender, non distended, no abdominal masses. Liver and spleen not palpable.  No hernias appreciated.  Stool sample for occult testing is not indicated.   GU: No CVA tenderness.  No bladder fullness or masses.   Rectal:  Deferred.  Skin: No rashes, bruises or suspicious lesions. Lymph: No cervical or inguinal adenopathy. Neurologic: Grossly intact, no focal deficits, moving all 4 extremities. Psychiatric: Normal mood and affect.  Laboratory Data: Lab Results  Component Value Date   WBC 6.3 08/06/2015   HGB 14.4 08/06/2015   HCT 42.4 08/06/2015   MCV 94.4 08/06/2015   PLT 205.0 08/06/2015    Lab Results  Component Value Date   CREATININE 1.11 08/20/2016    Lab Results  Component Value Date   PSA 1.24 09/01/2013     Lab Results  Component Value Date   HGBA1C 8.2 (H) 08/20/2016    Lab Results  Component Value Date   TSH 0.919 11/22/2014       Component Value Date/Time   CHOL 113 07/31/2015 0840   HDL 41 07/31/2015 0840   CHOLHDL 2.8 07/31/2015 0840   CHOLHDL 4 09/01/2013 0942   VLDL 13.2 09/01/2013 0942   LDLCALC 57 07/31/2015 0840    Lab Results  Component Value Date   AST 20 08/20/2016   Lab Results  Component Value Date   ALT 29 08/20/2016     Urinalysis Unremarkable.  See EPIC.    Pertinent Imaging: CLINICAL DATA:  Right back pain, history of kidney stones,  right hydronephrosis on recent ultrasound  EXAM: CT ABDOMEN AND PELVIS WITHOUT CONTRAST  TECHNIQUE: Multidetector CT imaging of the abdomen and pelvis was performed following the standard protocol without IV contrast.  COMPARISON:  Ultrasound 08/05/2016  FINDINGS: Lower chest: Lung bases shows no acute findings  Hepatobiliary: Unenhanced  liver shows no biliary ductal dilatation. No calcified gallstones are noted within gallbladder.  Pancreas: Unremarkable. No pancreatic ductal dilatation or surrounding inflammatory changes.  Spleen: Normal in size without focal abnormality.  Adrenals/Urinary Tract: There is a low-density nodule left adrenal gland measures 1.9 cm probable adrenal adenoma. Nonobstructive calculus in lower pole of the right kidney measures 3 mm. There is moderate right hydronephrosis. Significant dilatation of right extrarenal pelvis. There is no stone in right UPJ region. The right ureter is small caliber collapsed. The UPJ is visualized in coronal image 69. 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 No definite mass or obstructive stone is noted. At least 3 nonobstructive calculi are noted within left kidney the largest in upper pole measures 2.7 mm. No left hydro nephrosis or hydroureter. Bilateral no calcified ureteral calculi are noted. Bilateral distal ureter is not distended. No calcified calculi are noted within urinary bladder.  Stomach/Bowel: No small bowel obstruction. Some colonic stool and gas noted throughout the colon. No pericecal inflammation. Normal appendix noted in axial image 57. Terminal ileum is unremarkable. Scattered diverticula are noted descending colon. Multiple sigmoid colon diverticula are noted. No evidence of acute diverticulitis or acute colitis. No distal colonic obstruction.  Vascular/Lymphatic: No aortic aneurysm. Atherosclerotic calcifications of abdominal aorta and iliac arteries are noted.  No retroperitoneal or mesenteric adenopathy.  Reproductive: No pelvic mass. Tiny prostate gland calcifications are noted.  Other: There is a left inguinal scrotal canal hernia containing fat measures 2.9 cm. No evidence of acute complication. No inguinal adenopathy. No ascites or free abdominal air.  Musculoskeletal: No destructive bony lesions are noted. Sagittal images of the spine shows mild degenerative changes lumbar spine. Mild disc space flattening with endplate sclerotic changes and mild posterior disc bulge at L4-L5 level.  IMPRESSION: 1. There is moderate right hydronephrosis. Significant dilatation of the right extrarenal pelvis. The right ureter is small caliber collapsed. The UPJ is visualized in coronal image 69. 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 No definite mass or obstructive stone is noted on this unenhanced scan. Further evaluation and correlation with urology exam is recommended. There is bilateral nonobstructive nephrolithiasis. Bilateral ureter is small caliber without evidence of calcified ureteral calculi. 2. No calcified calculi are noted within urinary bladder. 3. No small bowel or colonic obstruction. Normal appendix. No pericecal inflammation. 4. 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 5. Colonic diverticula are noted descending colon and sigmoid colon. No evidence of acute diverticulitis or colitis.   Electronically Signed   By: Lahoma Crocker M.D.   On: 08/12/2016 12:38   Assessment & Plan:    1. 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 Erectile dysfunction  - has taken sildenafil in the past - will  need cardiac clearance for refill      Return for follow up CT report.  These notes generated with voice recognition software. I apologize for typographical errors.  Zara Council, [facility] [address] [place], [place] [phone]

## 2016-08-28 ENCOUNTER — Ambulatory Visit (INDEPENDENT_AMBULATORY_CARE_PROVIDER_SITE_OTHER): Payer: Medicare Other | Admitting: Urology

## 2016-08-28 ENCOUNTER — Encounter: Payer: Self-pay | Admitting: Urology

## 2016-08-28 VITALS — BP 124/75 | HR 91 | Ht 68.0 in | Wt 201.8 lb

## 2016-08-28 DIAGNOSIS — N529 Male erectile dysfunction, unspecified: Secondary | ICD-10-CM

## 2016-08-28 DIAGNOSIS — I25119 Atherosclerotic heart disease of native coronary artery with unspecified angina pectoris: Secondary | ICD-10-CM

## 2016-08-28 DIAGNOSIS — N1330 Unspecified hydronephrosis: Secondary | ICD-10-CM | POA: Diagnosis not present

## 2016-08-28 LAB — URINALYSIS, COMPLETE
BILIRUBIN UA: NEGATIVE
Ketones, UA: NEGATIVE
Leukocytes, UA: NEGATIVE
NITRITE UA: NEGATIVE
PH UA: 5 (ref 5.0–7.5)
Protein, UA: NEGATIVE
RBC UA: NEGATIVE
Specific Gravity, UA: <1.005 — ABNORMAL LOW (ref 1.005–1.030)
UUROB: 0.2 mg/dL (ref 0.2–1.0)

## 2016-08-28 LAB — MICROSCOPIC EXAMINATION
BACTERIA UA: NONE SEEN
RBC MICROSCOPIC, UA: NONE SEEN /HPF (ref 0–?)
WBC UA: NONE SEEN /HPF (ref 0–?)

## 2016-08-29 ENCOUNTER — Other Ambulatory Visit: Payer: Self-pay | Admitting: Family Medicine

## 2016-08-29 LAB — BUN+CREAT
BUN / CREAT RATIO: 17 (ref 10–24)
BUN: 18 mg/dL (ref 8–27)
CREATININE: 1.05 mg/dL (ref 0.76–1.27)
GFR calc non Af Amer: 74 mL/min/{1.73_m2} (ref >59)
GFR, EST AFRICAN AMERICAN: 86 mL/min/{1.73_m2} (ref >59)

## 2016-08-29 MED ORDER — SITAGLIPTIN PHOSPHATE 100 MG PO TABS: 100.0000 mg | ORAL_TABLET | Freq: Every day | ORAL | 0 refills | Status: DC

## 2016-08-29 MED ORDER — SITAGLIPTIN PHOSPHATE 100 MG PO TABS: 100.0000 mg | ORAL_TABLET | Freq: Every day | ORAL | 2 refills | Status: DC

## 2016-09-08 ENCOUNTER — Ambulatory Visit
Admission: RE | Admit: 2016-09-08 | Discharge: 2016-09-08 | Disposition: A | Discharge status: Home / Self Care | Payer: Medicare Other | Source: Ambulatory Visit | Attending: Urology | Admitting: Urology

## 2016-09-08 ENCOUNTER — Ambulatory Visit: Admission: RE | Admit: 2016-09-08 | Payer: Medicare Other | Source: Ambulatory Visit

## 2016-09-08 DIAGNOSIS — N200 Calculus of kidney: Secondary | ICD-10-CM | POA: Diagnosis not present

## 2016-09-08 DIAGNOSIS — K5730 Diverticulosis of large intestine without perforation or abscess without bleeding: Secondary | ICD-10-CM | POA: Insufficient documentation

## 2016-09-08 DIAGNOSIS — K4090 Unilateral inguinal hernia, without obstruction or gangrene, not specified as recurrent: Secondary | ICD-10-CM | POA: Insufficient documentation

## 2016-09-08 DIAGNOSIS — D3502 Benign neoplasm of left adrenal gland: Secondary | ICD-10-CM | POA: Insufficient documentation

## 2016-09-08 DIAGNOSIS — I700 Atherosclerosis of aorta: Secondary | ICD-10-CM | POA: Insufficient documentation

## 2016-09-08 DIAGNOSIS — R911 Solitary pulmonary nodule: Secondary | ICD-10-CM | POA: Insufficient documentation

## 2016-09-08 DIAGNOSIS — N132 Hydronephrosis with renal and ureteral calculous obstruction: Secondary | ICD-10-CM | POA: Diagnosis not present

## 2016-09-08 DIAGNOSIS — N1330 Unspecified hydronephrosis: Secondary | ICD-10-CM

## 2016-09-08 MED ORDER — IOPAMIDOL (ISOVUE-300) INJECTION 61%
100.0000 mL | Freq: Once | INTRAVENOUS | Status: AC | PRN
  Administered 2016-09-08: 100 mL via INTRAVENOUS

## 2016-09-10 NOTE — Progress Notes (Signed)
09/11/2016 2:54 PM   [name] Sep 03, 1950 [id number]  Referring provider: [name], MD [address] [place],  [address]  Chief Complaint  Patient presents with  . 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   Background history Patient was referred by Dr. Caryl Bis for right hydronephrosis.  Patient underwent an abdominal ultrasound in March 2018 for the evaluation for an elevated bilirubin and he was found to have a new finding of right hydronephrosis.  Of note, patient had a abdominal ultrasound in 2012 which noted normal bilateral kidneys.   CT Renal stone study performed on 08/12/2016 noted moderate right hydronephrosis.  Significant dilatation of the right extrarenal pelvis. The right ureter is small caliber collapsed. The UPJ is visualized in coronal image 69. 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 No definite mass or obstructive stone is noted on this unenhanced scan.  Further evaluation and correlation with urology exam is recommended. There is bilateral nonobstructive nephrolithiasis. Bilateral ureter is small caliber without evidence of calcified ureteral calculi.  No calcified calculi are noted within urinary bladder.  No small bowel or colonic obstruction. Normal appendix. No pericecal inflammation.  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  Colonic diverticula are noted descending colon and sigmoid colon.  No evidence of acute diverticulitis or colitis.  I have independently reviewed the films.  Patient has been experiencing some mild low back pain for several months, but he denies any acute pain.  He has not had fevers, chills, nausea or vomiting.  He denies any gross hematuria or recent passage of stones.  He does have a remote history of nephrolithiasis and had  undergone ureteroscopy in the 90's.  His baseline urinary symptoms consist of frequency, nocturia and a weak urinary stream.  He also has issues with erectile dysfunction. He has taken sildenafil for this condition with good effects.  His UA today was unremarkable.  His father was diagnosed with prostate cancer but lived to the age of 71 and died of strokes.  PSA was 1.24 ng/mL on 09/01/2013.  Cr 1.05 on 08/28/2016.    CT w/wo contrast performed on 09/08/2016 noted a moderate right hydronephrosis with UPJ stenosis/ stricture.  Bilateral nonobstructing renal calculi measuring up to 3 mm on the left.  2.0 cm left adrenal adenoma, benign.  4 mm subpleural right lower lobe pulmonary nodule. No follow-up needed if patient is low-risk. Non-contrast chest CT can be considered in 12 months if patient is high-risk.  I and Dr. Erlene Quan have independently reviewed the films.    He is requesting a refill on sildenafil at this time, but he has nitroglycerin tablets on hand.      PMH: Past Medical History:  Diagnosis Date  . Angina   . Asthma   . CAD (coronary artery disease) 04/12/2011   a. remote MI in 1996; b. MI 1997 s/p stenting to LAD & diag; c. MI 2009: long stenting from pLAD to mid LAD and diag & POBA of jailed diag branch; d. cath 2010: 80% ISR of LAD w/ L-R collats, med Rx; e. cath 2012: 50-60% tub mLAD, 60-70% dLAD, FFR 0.75 -->s/p PCI dissection repaired w/ 2 stents (3 total); f. cath 06/2012: no changes from 2012 cath, med Rx, patent stents   . Diabetes mellitus without complication ([place])   .  ED (erectile dysfunction)   . Heart disease   . HTN (hypertension) 04/12/2011  . Hyperlipemia 04/12/2011  . Ischemic cardiomyopathy    a. echo 2010: EF 45-50%, mild to mod ant and apical wall HK, trace MR; b. cardiac cath 06/2012: EF 40%, mild MR   . Myocardial infarction ([place])    x 5  . Obesity   . OSA (obstructive sleep apnea)    on CPAP  . Osteoarthritis     Surgical History: Past Surgical History:    Procedure Laterality Date  . CORONARY ANGIOPLASTY     x 5  . CORONARY STENT PLACEMENT     x 7  . LEFT HEART CATHETERIZATION WITH CORONARY ANGIOGRAM N/A 04/14/2011   Procedure: LEFT HEART CATHETERIZATION WITH CORONARY ANGIOGRAM;  Surgeon: Pixie Casino, MD;  Location: [facility] CATH LAB;  Service: Cardiovascular;  Laterality: N/A;  . LEFT HEART CATHETERIZATION WITH CORONARY ANGIOGRAM N/A 06/25/2012   Procedure: LEFT HEART CATHETERIZATION WITH CORONARY ANGIOGRAM;  Surgeon: Peter M Martinique, MD;  Location: [facility] CATH LAB;  Service: Cardiovascular;  Laterality: N/A;  . TONSILLECTOMY AND ADENOIDECTOMY  1955    Home Medications:  Allergies as of 09/11/2016      Reactions   Atarax [hydroxyzine Hcl]    Weakness, out of this world feeling.   Latex Rash   Other reaction(s): Other (See Comments) I.e. Elastic= rash to blisters if worn for an extended time Patient show effects after long exposure.      Medication List       Accurate as of 09/11/16  2:54 PM. Always use your most recent med list.          ACIDOPHILUS PROBIOTIC 100 MG Caps Take 1 capsule (100 mg total) by mouth daily.   aspirin 325 MG tablet Take 325 mg by mouth every morning.   atorvastatin 80 MG tablet Commonly known as:  LIPITOR Take 1 tablet (80 mg total) by mouth daily.   blood glucose meter kit and supplies Kit Dispense based on patient and insurance preference. Use once daily fasting. (FOR ICD 10 DIAGNOSIS OF E11.59).   carvedilol 6.25 MG tablet Commonly known as:  COREG TAKE 1 TABLET TWICE A DAY WITH MEALS (PLEASE CALL OUR OFFICE TO SCHEDULE AN 6 MONTHS APPOINTMENT FOR FUTURE REFILLS. [phone])   JARDIANCE 25 MG Tabs tablet Generic drug:  empagliflozin TAKE 1 TABLET DAILY   metFORMIN 1000 MG tablet Commonly known as:  GLUCOPHAGE Take 1 tablet (1,000 mg total) by mouth 2 (two) times daily with a meal.   multivitamins ther. w/minerals Tabs tablet Take 1 tablet by mouth every morning.   niacin 1000 MG CR  tablet Commonly known as:  NIASPAN TAKE 2 TABLETS (2,000 MG TOTAL) AT BEDTIME, PLEASE CALL CARDIOLOGY OFFICE TO SCHEDULE A 6 MONTH APPOINTMENT FOR FUTURE REFILLS, [phone]   nitroGLYCERIN 0.4 MG/SPRAY spray Commonly known as:  NITROLINGUAL Place 1 spray under the tongue every 5 (five) minutes as needed for chest pain.   prasugrel 10 MG Tabs tablet Commonly known as:  EFFIENT Take 1 tablet (10 mg total) by mouth daily.   ramipril 2.5 MG capsule Commonly known as:  ALTACE TAKE 1 CAPSULE EVERY MORNING   ranolazine 1000 MG SR tablet Commonly known as:  RANEXA Take 1 tablet (1,000 mg total) by mouth 2 (two) times daily.   sildenafil 100 MG tablet Commonly known as:  VIAGRA Take 1 tablet (100 mg total) by mouth daily as needed for erectile dysfunction.   sitaGLIPtin 100 MG tablet  Commonly known as:  JANUVIA Take 1 tablet (100 mg total) by mouth daily.       Allergies:  Allergies  Allergen Reactions  . Atarax [Hydroxyzine Hcl]     Weakness, out of this world feeling.  . Latex Rash    Other reaction(s): Other (See Comments) I.e. Elastic= rash to blisters if worn for an extended time Patient show effects after long exposure.    Family History: Family History  Problem Relation Age of Onset  . COPD Mother   . Other Father     muscle tumor  . Stroke Father   . Prostate cancer Father     mets  . Heart disease Father   . Alcoholism Father   . Stroke Sister   . Bladder Cancer Neg Hx   . Kidney cancer Neg Hx     Social History:  reports that he quit smoking about 9 years ago. His smoking use included Cigarettes. He has never used smokeless tobacco. He reports that he does not drink alcohol or use drugs.  ROS: UROLOGY Frequent Urination?: No Hard to postpone urination?: No Burning/pain with urination?: No Get up at night to urinate?: No Leakage of urine?: No Urine stream starts and stops?: No Trouble starting stream?: No Do you have to strain to urinate?:  No Blood in urine?: No Urinary tract infection?: No Sexually transmitted disease?: No Injury to kidneys or bladder?: No Painful intercourse?: No Weak stream?: No Erection problems?: Yes Penile pain?: No  Gastrointestinal Nausea?: No Vomiting?: No Indigestion/heartburn?: No Diarrhea?: No Constipation?: No  Constitutional Fever: No Night sweats?: Yes Weight loss?: No Fatigue?: No  Skin Skin rash/lesions?: No Itching?: No  Eyes Blurred vision?: No Double vision?: No  Ears/Nose/Throat Sore throat?: No Sinus problems?: No  Hematologic/Lymphatic Swollen glands?: No Easy bruising?: No  Cardiovascular Leg swelling?: No Chest pain?: No  Respiratory Cough?: No Shortness of breath?: No  Endocrine Excessive thirst?: No  Musculoskeletal Back pain?: Yes Joint pain?: No  Neurological Headaches?: No Dizziness?: No  Psychologic Depression?: No Anxiety?: No  Physical Exam: BP (!) 144/82   Pulse 94   Ht _0  (1.727 m)   Wt 209 lb (94.8 kg)   BMI 31.78 kg/m   Constitutional: Well nourished. Alert and oriented, No acute distress. HEENT: [place] AT, moist mucus membranes. Trachea midline, no masses. Cardiovascular: No clubbing, cyanosis, or edema. Respiratory: Normal respiratory effort, no increased work of breathing. GI: Abdomen is soft, non tender, non distended, no abdominal masses. Liver and spleen not palpable.  No hernias appreciated.  Stool sample for occult testing is not indicated.   GU: No CVA tenderness.  No bladder fullness or masses.   Patient with uncircumcised phallus.  Foreskin not easily retracted.  Urethral meatus is patent.  No penile discharge. No penile lesions or rashes. Scrotum without lesions, cysts, rashes and/or edema.  Testicles are located scrotally bilaterally. No masses are appreciated in the testicles. Left and right epididymis are normal. Rectal: Patient with  normal sphincter tone. Anus and perineum without scarring or rashes. No  rectal masses are appreciated. Prostate is approximately 45 grams, no nodules are appreciated. Seminal vesicles are normal. Skin: No rashes, bruises or suspicious lesions. Lymph: No cervical or inguinal adenopathy. Neurologic: Grossly intact, no focal deficits, moving all 4 extremities. Psychiatric: Normal mood and affect.  Laboratory Data: Lab Results  Component Value Date   WBC 6.3 08/06/2015   HGB 14.4 08/06/2015   HCT 42.4 08/06/2015   MCV 94.4 08/06/2015  PLT 205.0 08/06/2015    Lab Results  Component Value Date   CREATININE 1.05 08/28/2016    Lab Results  Component Value Date   PSA 1.24 09/01/2013     Lab Results  Component Value Date   HGBA1C 8.2 (H) 08/20/2016    Lab Results  Component Value Date   TSH 0.919 11/22/2014       Component Value Date/Time   CHOL 113 07/31/2015 0840   HDL 41 07/31/2015 0840   CHOLHDL 2.8 07/31/2015 0840   CHOLHDL 4 09/01/2013 0942   VLDL 13.2 09/01/2013 0942   LDLCALC 57 07/31/2015 0840    Lab Results  Component Value Date   AST 20 08/20/2016   Lab Results  Component Value Date   ALT 29 08/20/2016     Pertinent Imaging: CLINICAL DATA:  Right renal abnormality on CT  EXAM: CT ABDOMEN AND PELVIS WITHOUT AND WITH CONTRAST  TECHNIQUE: Multidetector CT imaging of the abdomen and pelvis was performed following the standard protocol before and following the bolus administration of intravenous contrast.  CONTRAST:  128m ISOVUE-300 IOPAMIDOL (ISOVUE-300) INJECTION 61%  COMPARISON:  CT abdomen/pelvis dated 08/12/2016  FINDINGS: Lower chest: 4 mm subpleural nodule at the posterior right lung base (series 6/ image 7).  Hepatobiliary: 5 mm cyst in segment 2 (series 4/ image 10).  Gallbladder is unremarkable. No intrahepatic or extrahepatic ductal dilatation.  Pancreas: Within normal limits.  Spleen: Within normal limits.  Adrenals/Urinary Tract: 2.0 cm left adrenal adenoma (series 4/ image 24),  benign. Right adrenal gland is within normal limits.  Two nonobstructing left renal calculi measuring up to 3 mm (series 2/image 30). No hydronephrosis.  2 mm nonobstructing right upper pole renal calculus (series 2/ image 31). Moderate right hydronephrosis with UPJ stenosis/ stricture (series 12/ image 626).  Bladder is within normal limits.  Stomach/Bowel: Stomach is within normal limits.  No evidence of bowel obstruction.  Normal appendix (series 4/ image 626).  Sigmoid diverticulosis, without evidence of diverticulitis.  Vascular/Lymphatic: No evidence of abdominal aortic aneurysm.  Atherosclerotic calcifications of the abdominal aorta and branch vessels.  No suspicious abdominopelvic lymphadenopathy.  Reproductive: Prostate is grossly unremarkable.  Other: No abdominopelvic ascites.  Small fat containing left inguinal hernia (series 4/ image 84).  Musculoskeletal: Degenerative changes of the visualized thoracolumbar spine.  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  Bilateral nonobstructing renal calculi measuring up to 3 mm on the left.  2.0 cm left adrenal adenoma, benign.  4 mm subpleural right lower lobe pulmonary nodule. No follow-up needed if patient is low-risk. Non-contrast chest CT can be considered in 12 months if patient is high-risk. This recommendation follows the consensus statement: Guidelines for Management of Incidental Pulmonary Nodules Detected on CT Images: From the Fleischner Society 2017; Radiology 2017; 284:228-243.  Additional ancillary findings as above.   Electronically Signed   By: SJulian HyM.D.   On: 09/08/2016 15:54   Assessment & Plan:    1. 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 Erectile dysfunction  - has taken sildenafil in the past - will need cardiac clearance for refill  3. PSA screening  - I discuss that the AUA panel feels that in men age 6258to 673years, there was  sufficient certainty that the benefits of screening could outweigh the harms that a recommendation of shared decision-making in this age group was justified.- patient agrees  - PSA drawn today  4. Phimosis  - schedule circumcision in the office -   - explained the procedure to the patient and the risks of infection, bleeding, pain, penile necrosis and/or deformity     Return for schedule circumcision.  These notes generated with voice recognition software. I apologize for typographical errors.  Zara Council, [facility] [address] [place], [place] [address] [phone]

## 2016-09-11 ENCOUNTER — Encounter: Payer: Self-pay | Admitting: Urology

## 2016-09-11 ENCOUNTER — Ambulatory Visit (INDEPENDENT_AMBULATORY_CARE_PROVIDER_SITE_OTHER): Payer: Medicare Other | Admitting: Urology

## 2016-09-11 VITALS — BP 144/82 | HR 94 | Ht 68.0 in | Wt 209.0 lb

## 2016-09-11 DIAGNOSIS — N471 Phimosis: Secondary | ICD-10-CM

## 2016-09-11 DIAGNOSIS — R351 Nocturia: Secondary | ICD-10-CM | POA: Diagnosis not present

## 2016-09-11 DIAGNOSIS — Z125 Encounter for screening for malignant neoplasm of prostate: Secondary | ICD-10-CM

## 2016-09-11 DIAGNOSIS — I25119 Atherosclerotic heart disease of native coronary artery with unspecified angina pectoris: Secondary | ICD-10-CM

## 2016-09-11 DIAGNOSIS — N1330 Unspecified hydronephrosis: Secondary | ICD-10-CM | POA: Diagnosis not present

## 2016-09-11 DIAGNOSIS — N135 Crossing vessel and stricture of ureter without hydronephrosis: Secondary | ICD-10-CM

## 2016-09-11 DIAGNOSIS — N529 Male erectile dysfunction, unspecified: Secondary | ICD-10-CM

## 2016-09-12 ENCOUNTER — Telehealth: Payer: Self-pay

## 2016-09-12 ENCOUNTER — Telehealth: Payer: Self-pay | Admitting: Cardiovascular Disease

## 2016-09-12 LAB — PSA: Prostate Specific Ag, Serum: 2.3 ng/mL (ref 0.0–4.0)

## 2016-09-12 NOTE — Telephone Encounter (Signed)
-----   Message from Nori Riis, PA-C sent at 09/12/2016  7:51 AM EDT ----- Patient's PSA is stable at 2.3.

## 2016-09-12 NOTE — Telephone Encounter (Signed)
Spoke with pt in reference to PSA results. Pt voiced understanding.  

## 2016-09-12 NOTE — Telephone Encounter (Signed)
Received cardiac clearance request for pt to proceed w/ in-office circumcision w/ Dr. Junious Silk on 10/07/16. He would like recommendations on holding pt's Effient & asprin 7 days prior to procedure and also asks if pt may have an rx for viagra. Please route to [place] @ [phone].

## 2016-09-14 NOTE — Telephone Encounter (Signed)
Ok to stop effient 7 days prior Stay on asa 81 mg daily Would talk with urology if they can give script for viagra Ok from cardiac perspective They need to determine time for healing

## 2016-09-15 ENCOUNTER — Telehealth: Payer: Self-pay | Admitting: Radiology

## 2016-09-15 NOTE — Telephone Encounter (Signed)
LMOM. Need to discuss circumcision.

## 2016-09-15 NOTE — Telephone Encounter (Signed)
Routed to fax # provided. 

## 2016-09-17 NOTE — Telephone Encounter (Signed)
Pt called back stating he does not want to proceed with circumcision at this time. He will call back if he decides to proceed.

## 2016-09-17 NOTE — Telephone Encounter (Signed)
Notified pt that per Dr Rockey Situ he may hold Effient but not ASA prior to circumcision so procedure would need to be done in the OR due to increased risk of bleeding. Pt became upset and states he doesn't want procedure done if it can't be done in the office. Please advise. Pt may be reached at [phone].

## 2016-09-22 ENCOUNTER — Telehealth: Payer: Self-pay | Admitting: Urology

## 2016-09-22 NOTE — Telephone Encounter (Signed)
I will be contacting him with the results of his renal lasix scan.  It is scheduled for the 22, so I will be out of the office that week.  I will not be able to see the results until I am back in the office the week after.  If they are severely abnormal, one of the other providers will handle the results.

## 2016-09-22 NOTE — Telephone Encounter (Signed)
You wanted him to have a lasik scan, are you going to call him with results or does he need a follow up app for these? The check out notes only said to schd him for a circumcision.  Thanks,  Sharyn Lull

## 2016-09-25 ENCOUNTER — Other Ambulatory Visit: Payer: Self-pay | Admitting: *Deleted

## 2016-09-25 DIAGNOSIS — I25119 Atherosclerotic heart disease of native coronary artery with unspecified angina pectoris: Secondary | ICD-10-CM

## 2016-09-25 MED ORDER — CARVEDILOL 6.25 MG PO TABS: ORAL_TABLET | ORAL | 0 refills | Status: DC

## 2016-09-30 ENCOUNTER — Other Ambulatory Visit: Payer: Self-pay | Admitting: *Deleted

## 2016-09-30 ENCOUNTER — Ambulatory Visit
Admission: RE | Admit: 2016-09-30 | Discharge: 2016-09-30 | Disposition: A | Discharge status: Home / Self Care | Payer: Medicare Other | Source: Ambulatory Visit | Attending: Urology | Admitting: Urology

## 2016-09-30 DIAGNOSIS — N135 Crossing vessel and stricture of ureter without hydronephrosis: Secondary | ICD-10-CM

## 2016-09-30 DIAGNOSIS — I25119 Atherosclerotic heart disease of native coronary artery with unspecified angina pectoris: Secondary | ICD-10-CM

## 2016-09-30 MED ORDER — NIACIN ER (ANTIHYPERLIPIDEMIC) 1000 MG PO TBCR: EXTENDED_RELEASE_TABLET | ORAL | 0 refills | Status: DC

## 2016-09-30 MED ORDER — FUROSEMIDE 10 MG/ML IJ SOLN
20.0000 mg | Freq: Once | INTRAMUSCULAR | Status: DC
  Filled 2016-09-30 (×2): qty 2

## 2016-09-30 MED ORDER — TECHNETIUM TC 99M MERTIATIDE
4.9140 | Freq: Once | INTRAVENOUS | Status: AC | PRN
  Administered 2016-09-30: 4.914 via INTRAVENOUS

## 2016-10-01 ENCOUNTER — Other Ambulatory Visit: Payer: Self-pay

## 2016-10-01 ENCOUNTER — Telehealth: Payer: Self-pay | Admitting: Cardiovascular Disease

## 2016-10-01 DIAGNOSIS — I25119 Atherosclerotic heart disease of native coronary artery with unspecified angina pectoris: Secondary | ICD-10-CM

## 2016-10-01 MED ORDER — CARVEDILOL 6.25 MG PO TABS: ORAL_TABLET | ORAL | 0 refills | Status: DC

## 2016-10-01 MED ORDER — NIACIN ER (ANTIHYPERLIPIDEMIC) 1000 MG PO TBCR: EXTENDED_RELEASE_TABLET | ORAL | 0 refills | Status: DC

## 2016-10-01 NOTE — Telephone Encounter (Signed)
Requested Prescriptions   Signed Prescriptions Disp Refills  . niacin (NIASPAN) 1000 MG CR tablet 60 tablet 0    Sig: TAKE 2 TABLETS (2,000 MG TOTAL) AT BEDTIME, PLEASE CALL CARDIOLOGY OFFICE TO SCHEDULE A 6 MONTH APPOINTMENT FOR FUTURE REFILLS [phone].    Authorizing Provider: Minna Merritts    Ordering User: Eugenio Hoes, MARINA C  . carvedilol (COREG) 6.25 MG tablet 60 tablet 0    Sig: TAKE 1 TABLET TWICE A DAY WITH MEALS    Authorizing Provider: Minna Merritts    Ordering User: Britt Bottom

## 2016-10-01 NOTE — Telephone Encounter (Signed)
Requested Prescriptions   Signed Prescriptions Disp Refills  . niacin (NIASPAN) 1000 MG CR tablet 60 tablet 0    Sig: TAKE 2 TABLETS (2,000 MG TOTAL) AT BEDTIME, PLEASE CALL CARDIOLOGY OFFICE TO SCHEDULE A 6 MONTH APPOINTMENT FOR FUTURE REFILLS [phone].    Authorizing Provider: Minna Merritts    Ordering User: Eugenio Hoes, Aivah Putman C  . carvedilol (COREG) 6.25 MG tablet 60 tablet 0    Sig: TAKE 1 TABLET TWICE A DAY WITH MEALS    Authorizing Provider: Minna Merritts    Ordering User: Britt Bottom

## 2016-10-01 NOTE — Telephone Encounter (Signed)
Pt states he also had nausea last night and this morning, which concerned him.

## 2016-10-01 NOTE — Telephone Encounter (Signed)
We haven't seen him since November. Does Dr. Rockey Situ, Gerald Stabs or Thurmond Butts have any openings to evaluate him?

## 2016-10-01 NOTE — Telephone Encounter (Signed)
Kiel

## 2016-10-01 NOTE — Telephone Encounter (Signed)
Pt states he had a very stressful day yesterday, and had a lot of chest tightness, it did ease off when he had a CT done, and he was forced to relax, last night his chest felt "sore". He feels like he had angina. Please call.

## 2016-10-01 NOTE — Telephone Encounter (Signed)
Pt is coming to see Christell Faith, PA 5/24 at 2:00

## 2016-10-02 ENCOUNTER — Encounter: Payer: Self-pay | Admitting: Physician Assistant

## 2016-10-02 ENCOUNTER — Ambulatory Visit (INDEPENDENT_AMBULATORY_CARE_PROVIDER_SITE_OTHER): Payer: Medicare Other | Admitting: Physician Assistant

## 2016-10-02 VITALS — BP 110/64 | HR 92 | Ht 68.0 in | Wt 201.5 lb

## 2016-10-02 DIAGNOSIS — I2510 Atherosclerotic heart disease of native coronary artery without angina pectoris: Secondary | ICD-10-CM

## 2016-10-02 DIAGNOSIS — R110 Nausea: Secondary | ICD-10-CM

## 2016-10-02 DIAGNOSIS — I209 Angina pectoris, unspecified: Secondary | ICD-10-CM

## 2016-10-02 DIAGNOSIS — E782 Mixed hyperlipidemia: Secondary | ICD-10-CM | POA: Diagnosis not present

## 2016-10-02 DIAGNOSIS — I25119 Atherosclerotic heart disease of native coronary artery with unspecified angina pectoris: Secondary | ICD-10-CM

## 2016-10-02 DIAGNOSIS — I10 Essential (primary) hypertension: Secondary | ICD-10-CM | POA: Diagnosis not present

## 2016-10-02 DIAGNOSIS — I255 Ischemic cardiomyopathy: Secondary | ICD-10-CM

## 2016-10-02 MED ORDER — CARVEDILOL 6.25 MG PO TABS: 6.2500 mg | ORAL_TABLET | Freq: Two times a day (BID) | ORAL | 3 refills | Status: DC

## 2016-10-02 MED ORDER — NIACIN ER (ANTIHYPERLIPIDEMIC) 1000 MG PO TBCR: 2000.0000 mg | EXTENDED_RELEASE_TABLET | Freq: Every day | ORAL | 0 refills | Status: DC

## 2016-10-02 MED ORDER — ASPIRIN EC 81 MG PO TBEC: 81.0000 mg | DELAYED_RELEASE_TABLET | Freq: Every day | ORAL | 3 refills | Status: DC

## 2016-10-02 MED ORDER — NIACIN ER (ANTIHYPERLIPIDEMIC) 1000 MG PO TBCR: 2000.0000 mg | EXTENDED_RELEASE_TABLET | Freq: Every day | ORAL | 3 refills | Status: DC

## 2016-10-02 MED ORDER — ISOSORBIDE MONONITRATE ER 30 MG PO TB24: 30.0000 mg | ORAL_TABLET | Freq: Every day | ORAL | 0 refills | Status: DC

## 2016-10-02 NOTE — Progress Notes (Signed)
Cardiology Office Note Date:  10/02/2016  Patient ID:  [name], [name] Jan 15, 1951, MRN [id number] PCP:  [name], MD  Cardiologist:  Dr. Rockey Situ, MD    Chief Complaint: Evaluation of chest tightness and nausea  History of Present Illness: [name] is a 66 y.o. male with history of CAD s/p multiple MI and PCI as below, also with history of ICM with EF of 40% on LV gram from cardiac cath 06/2012, HTN, HLD, prior tobacco abuse, obesity, and OSA on CPAP who presents for evaluation of chest tightness and nausea.   Patient suffered his first MI in 67. He suffered another MI in 1997 s/p stenting to the LAD and diagonal branches in [place], [place]. In 2009, he had a third MI with critical stenosis of the proximal LAD s/p long stenting to the mid LAD and diagonal branch. He also had POBA of the jailed branch. He underwent repeat cardiac cath on 07/05/2008 in the setting of an abnormal Myoview that showed a normal LM, patent stents along LAD. However, there was severe 80% ISR along an old stent which was described as long. There were faint left to right collaterals.Distal LCx with 40-50% stenosis, proximal RCA 50-60% stenosis, mid RCA 30-40% stenosis, and an EF of 35-40% with mild global hypokinesis and mid-to-distal anterior, anterolateral, and apical akinesis. The above ischemia improved with administration of nitroglycerin during the procedure. Continuing medical therapy was recommended. Repeat cardiac cath 04/14/2011 for unstable angina that showed LM normal, approximately 50-60% tubular mid LAD ISR, 60-70% distal LAD stenosis, ostial RCA 50% stenosis, and an EF of 45-50%. He underwent FFR of the mid LAD by Dr. Ellyn Hack that showed a reading of 0.75, hemodynamically significant. Thus, he underwent cutting balloon PCI/DES complicated by dissection leading to two further overlapping stents for repair for a total of 3. He was advised to remain on life long DAPT. He again presented  with chest pain in 06/2012. Repeat cardiac cath did not show any progression of his coronary disease, with single vessel obstructive CAD involving the distal LAD. This was unchanged from his prior study in 04/2011. The extensive stents in the proximal to mid LAD and first diagonal branch were still patent. EF was estimated at 40%. Medical management was recommended.   He was most recently seen by Dr. Rockey Situ in 03/2016 for routine follow up of his CAD and noted an isolated episode of chest pain while working on a puzzle that resolved with belching. Medical therapy with Gas-X was advised with close monitoring.   Previously noted elevated bilirubin. Abdominal ultrasound noted severe right-sided hydronephrosis with normal appearing liver, gallbladder, and CBD. Saw urolgoy with CT concerning for UPJ obstruction. 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   Patient called the office this morning noting he "had a stressful day" on 5/22 with chest tightness that resolved with relaxing. On the morning of 5/23 he noted some nausea.   Labs: A1c 8.2 (08/2016), LDL 57 (07/2015), LFTs normal (08/2016), SCr 1.1 (08/2016), K+ 4.2 (08/2016), unremakrable CBC (07/2015).  Patient comes in stating that Monday, 09/29/2016, was "not a good day." Patient has been under increased stress lately with his recently diagnosed right sided hydronephrosis as above. Patient reports as of late his friend's daughter and her child had been living with him and his girlfriend which has significantly stressed out the patient. The patient reports the girlfriend's daughter makes "poor decisions." He further states  she is "not bright." Apparently, on 5/21 patient's code friend's daughter and patient got into a verbal argument that was followed by the development of chest pain along the central anterior chest wall that briefly radiated to the left arm and was associated with mild nausea.  Upon the development of the chest pain the patient knew he needed to step away from the situation. Upon calming down patient reports his chest pain did improve though did not fully resolve until the evening of 5/21. Upon waking up on 5/22 and 5/23 patient noted there was a soreness along his anterior chest wall though nothing that he would call "chest pain." He has continued to live his exertional lifestyle without any symptoms of chest pain, shortness of breath, palpitations, diaphoresis, nausea, or vomiting. He has had not missed any doses of Effient or full dose aspirin. Blood pressure at home typically runs in the 1 teens systolic and 27N diastolic. He has not noted any lower extremity swelling. He reports his 1 weakness in life is "food." He is currently without chest pain.   Past Medical History:  Diagnosis Date  . Angina   . Asthma   . CAD (coronary artery disease) 04/12/2011   a. remote MI in 1996; b. MI 1997 s/p stenting to LAD & diag; c. MI 2009: long stenting from pLAD to mid LAD and diag & POBA of jailed diag branch; d. cath 2010: 80% ISR of LAD w/ L-R collats, med Rx; e. cath 2012: 50-60% tub mLAD, 60-70% dLAD, FFR 0.75 -->s/p PCI dissection repaired w/ 2 stents (3 total); f. cath 06/2012: no changes from 2012 cath, med Rx, patent stents   . Diabetes mellitus without complication ([place])   . ED (erectile dysfunction)   . Heart disease   . HTN (hypertension) 04/12/2011  . Hyperlipemia 04/12/2011  . Ischemic cardiomyopathy    a. echo 2010: EF 45-50%, mild to mod ant and apical wall HK, trace MR; b. cardiac cath 06/2012: EF 40%, mild MR   . Myocardial infarction ([place])    x 5  . Obesity   . OSA (obstructive sleep apnea)    on CPAP  . Osteoarthritis     Past Surgical History:  Procedure Laterality Date  . CORONARY ANGIOPLASTY     x 5  . CORONARY STENT PLACEMENT     x 7  . LEFT HEART CATHETERIZATION WITH CORONARY ANGIOGRAM N/A 04/14/2011   Procedure: LEFT HEART CATHETERIZATION WITH  CORONARY ANGIOGRAM;  Surgeon: Pixie Casino, MD;  Location[facility] CATH LAB;  Service: Cardiovascular;  Laterality: N/A;  . LEFT HEART CATHETERIZATION WITH CORONARY ANGIOGRAM N/A 06/25/2012   Procedure: LEFT HEART CATHETERIZATION WITH CORONARY ANGIOGRAM;  Surgeon: Peter M Martinique, MD;  Location: [facility] CATH LAB;  Service: Cardiovascular;  Laterality: N/A;  . TONSILLECTOMY AND ADENOIDECTOMY  1955    Current Outpatient Prescriptions  Medication Sig Dispense Refill  . aspirin 325 MG tablet Take 325 mg by mouth every morning.    Marland Kitchen atorvastatin (LIPITOR) 80 MG tablet Take 1 tablet (80 mg total) by mouth daily. 90 tablet 3  . blood glucose meter kit and supplies KIT Dispense based on patient and insurance preference. Use once daily fasting. (FOR ICD 10 DIAGNOSIS OF E11.59). 1 each 0  . carvedilol (COREG) 6.25 MG tablet TAKE 1 TABLET TWICE A DAY WITH MEALS 60 tablet 0  . JARDIANCE 25 MG TABS tablet TAKE 1 TABLET DAILY 90 tablet 0  . Lactobacillus (ACIDOPHILUS PROBIOTIC) 100 MG CAPS Take 1 capsule (  100 mg total) by mouth daily. (Patient taking differently: Take 100 mg by mouth daily as needed. ) 15 capsule 0  . metFORMIN (GLUCOPHAGE) 1000 MG tablet Take 1 tablet (1,000 mg total) by mouth 2 (two) times daily with a meal. 180 tablet 3  . Multiple Vitamins-Minerals (MULTIVITAMINS THER. W/MINERALS) TABS Take 1 tablet by mouth every morning.     . niacin (NIASPAN) 1000 MG CR tablet TAKE 2 TABLETS (2,000 MG TOTAL) AT BEDTIME, PLEASE CALL CARDIOLOGY OFFICE TO SCHEDULE A 6 MONTH APPOINTMENT FOR FUTURE REFILLS [phone]. 60 tablet 0  . nitroGLYCERIN (NITROLINGUAL) 0.4 MG/SPRAY spray Place 1 spray under the tongue every 5 (five) minutes as needed for chest pain. 12 g 12  . prasugrel (EFFIENT) 10 MG TABS tablet Take 1 tablet (10 mg total) by mouth daily. 90 tablet 3  . ramipril (ALTACE) 2.5 MG capsule TAKE 1 CAPSULE EVERY MORNING 90 capsule 3  . ranolazine (RANEXA) 1000 MG SR tablet Take 1 tablet (1,000 mg total) by  mouth 2 (two) times daily. 180 tablet 3  . sildenafil (VIAGRA) 100 MG tablet Take 1 tablet (100 mg total) by mouth daily as needed for erectile dysfunction. 7 tablet 5  . sitaGLIPtin (JANUVIA) 100 MG tablet Take 1 tablet (100 mg total) by mouth daily. 30 tablet 0   No current facility-administered medications for this visit.     Allergies:   Atarax [hydroxyzine hcl] and Latex   Social History:  The patient  reports that he quit smoking about 9 years ago. His smoking use included Cigarettes. He has never used smokeless tobacco. He reports that he does not drink alcohol or use drugs.   Family History:  The patient's family history includes Alcoholism in his father; COPD in his mother; Heart disease in his father; Other in his father; Prostate cancer in his father; Stroke in his father and sister.  ROS:   Review of Systems  Constitutional: Negative for chills, diaphoresis, fever, malaise/fatigue and weight loss.  HENT: Negative for congestion.   Eyes: Negative for discharge and redness.  Respiratory: Negative for cough, hemoptysis, sputum production, shortness of breath and wheezing.   Cardiovascular: Positive for chest pain. Negative for palpitations, orthopnea, claudication, leg swelling and PND.  Gastrointestinal: Positive for nausea. Negative for abdominal pain, blood in stool, heartburn, melena and vomiting.  Genitourinary: Negative for hematuria.  Musculoskeletal: Negative for falls and myalgias.  Skin: Negative for rash.  Neurological: Negative for dizziness, tingling, tremors, sensory change, speech change, focal weakness, loss of consciousness and weakness.  Endo/Heme/Allergies: Does not bruise/bleed easily.  Psychiatric/Behavioral: Negative for depression and substance abuse. The patient is nervous/anxious.   All other systems reviewed and are negative.    PHYSICAL EXAM:  VS:  BP 110/64 (BP Location: Left Arm, Patient Position: Sitting, Cuff Size: Normal)   Pulse 92   Ht _0   (1.727 m)   Wt 201 lb 8 oz (91.4 kg)   BMI 30.64 kg/m  BMI: Body mass index is 30.64 kg/m.  Physical Exam  Constitutional: He is oriented to person, place, and time. He appears well-developed and well-nourished.  HENT:  Head: Normocephalic and atraumatic.  Eyes: Right eye exhibits no discharge. Left eye exhibits no discharge.  Neck: Normal range of motion. No JVD present.  Cardiovascular: Normal rate, regular rhythm, S1 normal, S2 normal and normal heart sounds.  Exam reveals no distant heart sounds, no friction rub, no midsystolic click and no opening snap.   No murmur heard. Pulses:  Dorsalis pedis pulses are 2+ on the right side, and 2+ on the left side.  Pulmonary/Chest: Effort normal and breath sounds normal. No respiratory distress. He has no decreased breath sounds. He has no wheezes. He has no rales. He exhibits no tenderness.  Abdominal: Soft. He exhibits no distension. There is no tenderness.  Musculoskeletal: He exhibits no edema.  Neurological: He is alert and oriented to person, place, and time.  Skin: Skin is warm and dry. No cyanosis. Nails show no clubbing.  Psychiatric: He has a normal mood and affect. His speech is normal and behavior is normal. Judgment and thought content normal.     EKG:  Was ordered and interpreted by me today. Shows NSR, 92 bpm, prior inferior infarct , nonspecific lateral st/t changes  Recent Labs: 08/20/2016: ALT 29; Potassium 4.2; Sodium 139 08/28/2016: BUN 18; Creatinine, Ser 1.05  No results found for requested labs within last 8760 hours.   CrCl cannot be calculated (Patient's most recent lab result is older than the maximum 21 days allowed.).   Wt Readings from Last 3 Encounters:  10/02/16 201 lb 8 oz (91.4 kg)  09/11/16 209 lb (94.8 kg)  08/28/16 201 lb 12.8 oz (91.5 kg)     Other studies reviewed: Additional studies/records reviewed today include: summarized above  ASSESSMENT AND PLAN:  1. Chest tightness/CAD with angina  pectoris/nausea:Currently without chest pain. Discussed multiple evaluation modalities as well as treatment options. Patient reports he would like "less is more." Patient decided to have a trial of isosorbide mononitrate 30 mg daily. He declines stress testing and cardiac catheterization at this time. His baseline systolic blood pressure in the 1 teens precludes further titration of carvedilol at this time. Continue carvedilol 6.25 mg twice a day along with Ranexa 1000 mg twice a day. Should he develop recurrence of symptoms concerning for angina he will contact the office for further evaluation. He has previously been recommended to continue dual antiplatelet therapy for life. It is unclear why the patient is on full dose aspirin along with Effient. I will decrease his aspirin from 325 mg daily to 81 mg daily and he will continue Effient 10 mg daily.  2. ICM: He does not appear grossly volume overloaded at this time. Continue carvedilol 6.25 mg twice a day and ramipril 2.5 mg daily. Consider addition of spironolactone at follow-up, we'll hold off for now pending patient's renal workup.  3. HTN: Well-controlled. Continue current medications as above.  4. HLD: Continue atorvastatin and niacin.  5. Right-sided hydronephrosis: Followed by Urology.    Disposition: F/u with Dr. Rockey Situ in 3 months.    Current medicines are reviewed at length with the patient today. The patient did not have any concerns regarding medicines.  Melvern Banker PA-C 10/02/2016 1:52 PM     [place] [address] [place], [place] [address] [phone]

## 2016-10-02 NOTE — Patient Instructions (Signed)
Medication Instructions:  Your physician has recommended you make the following change in your medication:  1- DECREASE Aspirin to 81 mg by mouth once a day. 2- START Imdur 30 mg (1 tablet) by mouth once a day.   Labwork: none  Testing/Procedures: none  Follow-Up: Your physician recommends that you schedule a follow-up appointment in: [place].   If you need a refill on your cardiac medications before your next appointment, please call your pharmacy.

## 2016-10-07 ENCOUNTER — Ambulatory Visit: Payer: Medicare Other

## 2016-10-16 ENCOUNTER — Other Ambulatory Visit: Payer: Self-pay | Admitting: Urology

## 2016-10-16 ENCOUNTER — Telehealth: Payer: Self-pay

## 2016-10-16 DIAGNOSIS — N135 Crossing vessel and stricture of ureter without hydronephrosis: Secondary | ICD-10-CM

## 2016-10-16 NOTE — Telephone Encounter (Signed)
-----   Message from Nori Riis, PA-C sent at 10/16/2016  7:51 AM EDT ----- Please apologize to [name] that it has taken so long to call with his results, but I was mistaken and thought that he had scheduled an appointment to come in.  The scan looks good.  His kidney is functioning well.  We will need to repeat the study in one year.

## 2016-10-16 NOTE — Telephone Encounter (Signed)
Patient notified of results.

## 2016-11-02 ENCOUNTER — Other Ambulatory Visit: Payer: Self-pay | Admitting: Family Medicine

## 2016-11-14 ENCOUNTER — Telehealth: Payer: Self-pay | Admitting: Family Medicine

## 2016-11-14 DIAGNOSIS — M7022 Olecranon bursitis, left elbow: Secondary | ICD-10-CM | POA: Diagnosis not present

## 2016-11-14 NOTE — Telephone Encounter (Signed)
Patient has been informed of UC. Patient is still unsure if he will go or not but I did urge him to go to [facility].

## 2016-11-14 NOTE — Telephone Encounter (Signed)
Noted Fransisco Beau call and advise [facility] clinic as a option for urgent care. thanks

## 2016-11-14 NOTE — Telephone Encounter (Signed)
Patient Name: [name]  DOB: 09/27/1950    Initial Comment Caller states, Estill Bamberg from office- patient is complaining of left elbow swelling, no numbess or tingling. Stiffness only.    Nurse Assessment  Nurse: Mallie Mussel, RN, Alveta Heimlich Date/Time Eilene Ghazi Time): 11/14/2016 12:27:44 PM  Confirm and document reason for call. If symptomatic, describe symptoms. ---Caller states that he noticed his left elbow begin swelling about an hour ago. He denies injury, he denies bee stings. He denies pain, but he does have some stiffness. He denies numbness and tingling. He denies new rashes.  Does the patient have any new or worsening symptoms? ---Yes  Will a triage be completed? ---Yes  Related visit to physician within the last 2 weeks? ---No  Does the PT have any chronic conditions? (i.e. diabetes, asthma, etc.) ---Yes  List chronic conditions. ---Diabetes, HTN, Hypercholesterolemia  Is this a behavioral health or substance abuse call? ---No     Guidelines    Guideline Title Affirmed Question Affirmed Notes  Elbow Swelling Very swollen joint    Final Disposition User   See Physician within [place], RN, Alveta Heimlich    Comments  No appointments available at [facility] for this afternoon. He does not want to go to [place]. He states he will go to an UC.   Referrals  REFERRED TO PCP OFFICE   Disagree/Comply: Comply

## 2016-11-14 NOTE — Telephone Encounter (Signed)
Pt called and stated that his left elbow is swelling and keep getting bigger and bigger. Pt states that there is no injury or pain. Sent call to [facility] Triage.  Call pt @ [phone]

## 2016-12-01 ENCOUNTER — Encounter: Payer: Self-pay | Admitting: Family Medicine

## 2016-12-01 ENCOUNTER — Ambulatory Visit (INDEPENDENT_AMBULATORY_CARE_PROVIDER_SITE_OTHER): Payer: Medicare Other | Admitting: Family Medicine

## 2016-12-01 VITALS — BP 120/78 | HR 84 | Temp 98.3°F | Wt 200.8 lb

## 2016-12-01 DIAGNOSIS — E1165 Type 2 diabetes mellitus with hyperglycemia: Secondary | ICD-10-CM | POA: Diagnosis not present

## 2016-12-01 DIAGNOSIS — I159 Secondary hypertension, unspecified: Secondary | ICD-10-CM | POA: Diagnosis not present

## 2016-12-01 DIAGNOSIS — Z86010 Personal history of colonic polyps: Secondary | ICD-10-CM | POA: Insufficient documentation

## 2016-12-01 DIAGNOSIS — E7800 Pure hypercholesterolemia, unspecified: Secondary | ICD-10-CM | POA: Diagnosis not present

## 2016-12-01 DIAGNOSIS — M7022 Olecranon bursitis, left elbow: Secondary | ICD-10-CM | POA: Insufficient documentation

## 2016-12-01 DIAGNOSIS — IMO0001 Reserved for inherently not codable concepts without codable children: Secondary | ICD-10-CM

## 2016-12-01 DIAGNOSIS — I255 Ischemic cardiomyopathy: Secondary | ICD-10-CM

## 2016-12-01 LAB — POCT GLYCOSYLATED HEMOGLOBIN (HGB A1C): HEMOGLOBIN A1C: 8.1

## 2016-12-01 NOTE — Progress Notes (Signed)
[name], MD Phone: [phone]  [name] is a 66 y.o. male who presents today for follow-up.  HYPERTENSION  Disease Monitoring  Home BP Monitoring similar to today Chest pain- yes, see below    Dyspnea- no Medications  Compliance-  taking Coreg, ramipril, Ranexa, Effient.  Edema- no Patient was evaluated by cardiology 2 months ago for an episode of chest pain that he states was related to stress. He notes intermittent chest tightness that he describes as a "big hand wrapping around my heart and squeezing." This is only related to significant stress with his home situation. Only occurs if there is significant stress. Does not occur lower stress levels. He notes no exertional symptoms. No radiation of the pain. No diaphoresis. They discussed starting on Imdur at that time though patient reports they decided to hold off on this and hold off on any further workup at his request. He has follow-up with cardiology next month.  Hyperlipidemia: Taking Lipitor and niacin. No right upper quadrant pain or myalgias.  Diabetes: Reports his blood sugars are good. Taking metformin, Jardiance, and Januvia. No polyuria or polydipsia. Notes feeling hypoglycemic if he misses a meal though this resolves quickly with eating. He is due for ophthalmology visit.  He was seen at the walk-in clinic for left olecranon bursitis. They placed him on doxycycline and prednisone. He finished both of these things. Notes it has improved quite a bit. Still some swelling. No erythema or fevers.  Patient previously had colonoscopy that revealed multiple polyps. Recommendation was for three-year follow-up.  PMH: Former smoker   ROS see history of present illness  Objective  Physical Exam Vitals:   12/01/16 1041  BP: 120/78  Pulse: 84  Temp: 98.3 F (36.8 C)    BP Readings from Last 3 Encounters:  12/01/16 120/78  10/02/16 110/64  09/11/16 (!) 144/82   Wt Readings from Last 3 Encounters:    12/01/16 200 lb 12.8 oz (91.1 kg)  10/02/16 201 lb 8 oz (91.4 kg)  09/11/16 209 lb (94.8 kg)    Physical Exam  Constitutional: No distress.  Cardiovascular: Normal rate, regular rhythm and normal heart sounds.   Pulmonary/Chest: Effort normal and breath sounds normal.  Musculoskeletal:  Mild swelling over left olecranon process, mild swelling distally over the ulnar aspect of the elbow to upper forearm, minimal tenderness in this area, no erythema, no swelling elsewhere in his left arm  Neurological: He is alert. Gait normal.  Skin: Skin is warm and dry. He is not diaphoretic.     Assessment/Plan: Please see individual problem list.  HTN (hypertension) At goal. Chest tightness could be related to stress. He has discussed further evaluation with cardiology already in the decision was to hold off on this. Encouraged follow-up with his cardiologist to consider further evaluation. Given return precautions.  Diabetes mellitus type 2, uncontrolled, without complications (HCC) Above goal. Discussed addition of an alternative medication though he wants to hold off on this and work on diet and exercise. Encouraged walking for exercise. Given dietary instructions. We will have him see our pharmacist in about a month.  Hyperlipemia Continue current medication.  Olecranon bursitis of left elbow Patient still with some residual swelling and minimal tenderness though the tenderness is not over his olecranon bursa. Slight swelling and tenderness in the forearm that is focal. Given focality it is unlikely related to DVT. Suspect residual swelling from his olecranon bursitis though given persistence we'll have him see sports medicine to consider ultrasound and  further evaluation.  History of colon polyps Appears he is due for colonoscopy. Refer back to GI.   Orders Placed This Encounter  Procedures  . Ambulatory referral to Sports Medicine    Referral Priority:   Routine    Referral Type:    Consultation    Number of Visits Requested:   1  . Ambulatory referral to Gastroenterology    Referral Priority:   Routine    Referral Type:   Consultation    Referral Reason:   Specialty Services Required    Number of Visits Requested:   1  . POCT HgB A1C   [name], MD [place]

## 2016-12-01 NOTE — Assessment & Plan Note (Addendum)
At goal. Chest tightness could be related to stress. He has discussed further evaluation with cardiology already in the decision was to hold off on this. Encouraged follow-up with his cardiologist to consider further evaluation. Given return precautions.

## 2016-12-01 NOTE — Assessment & Plan Note (Signed)
Patient still with some residual swelling and minimal tenderness though the tenderness is not over his olecranon bursa. Slight swelling and tenderness in the forearm that is focal. Given focality it is unlikely related to DVT. Suspect residual swelling from his olecranon bursitis though given persistence we'll have him see sports medicine to consider ultrasound and further evaluation.

## 2016-12-01 NOTE — Patient Instructions (Signed)
Nice to see you. Please work on diet and exercise changes as discussed. There are some diet instructions below. We'll get you to see GI for colonoscopy. We'll get you to see sports medicine for your elbow. If you develop persistent chest pain or any new symptoms such as shortness of breath or radiation of the chest pain please be evaluated. Please follow up with cardiology as scheduled.  Diet Recommendations  Starchy (carb) foods: Bread, rice, pasta, potatoes, corn, cereal, grits, crackers, bagels, muffins, all baked goods.  (Fruits, milk, and yogurt also have carbohydrate, but most of these foods will not spike your blood sugar as the starchy foods will.)  A few fruits do cause high blood sugars; use small portions of bananas (limit to 1/2 at a time), grapes, watermelon, oranges, and most tropical fruits.    Protein foods: Meat, fish, poultry, eggs, dairy foods, and beans such as pinto and kidney beans (beans also provide carbohydrate).   1. Eat at least 3 meals and 1-2 snacks per day. Never go more than 4-5 hours while awake without eating. Eat breakfast within the first hour of getting up.   2. Limit starchy foods to TWO per meal and ONE per snack. ONE portion of a starchy  food is equal to the following:   - ONE slice of bread (or its equivalent, such as half of a hamburger bun).   - 1/2 cup of a "scoopable" starchy food such as potatoes or rice.   - 15 grams of carbohydrate as shown on food label.  3. Include at every meal: a protein food, a carb food, and vegetables and/or fruit.   - Obtain twice the volume of veg's as protein or carbohydrate foods for both lunch and dinner.   - Fresh or frozen veg's are best.   - Keep frozen veg's on hand for a quick vegetable serving.

## 2016-12-01 NOTE — Assessment & Plan Note (Signed)
Appears he is due for colonoscopy. Refer back to GI.

## 2016-12-01 NOTE — Assessment & Plan Note (Signed)
Continue current medication.

## 2016-12-01 NOTE — Assessment & Plan Note (Signed)
Above goal. Discussed addition of an alternative medication though he wants to hold off on this and work on diet and exercise. Encouraged walking for exercise. Given dietary instructions. We will have him see our pharmacist in about a month.

## 2016-12-15 NOTE — Progress Notes (Signed)
Called and left msg for pt to schedule with Chrys Racer.

## 2016-12-26 ENCOUNTER — Ambulatory Visit: Payer: Self-pay

## 2016-12-26 ENCOUNTER — Ambulatory Visit: Payer: Medicare Other | Admitting: Sports Medicine

## 2016-12-26 ENCOUNTER — Encounter: Payer: Self-pay | Admitting: Sports Medicine

## 2016-12-26 ENCOUNTER — Ambulatory Visit (INDEPENDENT_AMBULATORY_CARE_PROVIDER_SITE_OTHER): Payer: Medicare Other | Admitting: Sports Medicine

## 2016-12-26 VITALS — BP 122/74 | HR 94 | Ht 68.0 in | Wt 197.8 lb

## 2016-12-26 DIAGNOSIS — I255 Ischemic cardiomyopathy: Secondary | ICD-10-CM

## 2016-12-26 DIAGNOSIS — M7032 Other bursitis of elbow, left elbow: Secondary | ICD-10-CM

## 2016-12-26 DIAGNOSIS — M7022 Olecranon bursitis, left elbow: Secondary | ICD-10-CM | POA: Diagnosis not present

## 2016-12-26 NOTE — Procedures (Signed)
PROCEDURE NOTE - ULTRASOUND GUIDED ASPIRATION & INJECTION: LEFT olecranon bursa Images were obtained and interpreted by myself, Teresa Coombs, DO  Images have been saved and stored to PACS system. Images obtained on: GE S7 Ultrasound machine  ULTRASOUND FINDINGS: Large hypoechoic change within the olecranon bursa consistent with olecranon bursitis.  DESCRIPTION OF PROCEDURE:  The patient's clinical condition is marked by substantial pain and/or significant functional disability. Other conservative therapy has not provided relief, is contraindicated, or not appropriate. There is a reasonable likelihood that injection will significantly improve the patient's pain and/or functional impairment. After discussing the risks, benefits and expected outcomes of the injection and all questions were reviewed and answered, the patient wished to undergo the above named procedure. Verbal consent was obtained. The ultrasound was used to identify the target structure and adjacent neurovascular structures. The skin was then prepped in sterile fashion and the target structure was injected under direct visualization using sterile technique as below: PREP: Alcohol, Ethel Chloride, 3cc 1% lidocaine on 25 needle APPROACH: Superiolateral, stopcock technique, 18g 1.5" needle INJECTATE: 1cc of 1% lidocaine, 1cc 80 mg Depo-Medrol ASPIRATE: 10 cc of straw yellow clear fluid DRESSING: Band-Aid and 4" Ace-Wrap  Post procedural instructions including recommending icing and warning signs for infection were reviewed. This procedure was well tolerated and there were no complications.   IMPRESSION: Succesful US Guided Aspiration & injection

## 2016-12-26 NOTE — Patient Instructions (Signed)
Please keep this compressed with the Ace wrap and brace that you have at home whenever you are out of bed.  You can consider using this at nighttime as well if you would like.  I would like to give this a full 6 weeks prior to reevaluating it.  If it has 100% cleared up I do not have to see you back however if it is continuing to be present and is bothering you at all please plan to schedule a follow-up at that time.  ++++++++++++++++++++++++++++++++++++++++++++ You had an injection today.  Things to be aware of after injection are listed below: . You may experience no significant improvement or even a slight worsening in your symptoms during the first 24 to 48 hours.  After that we expect your symptoms to improve gradually over the next 2 weeks for the medicine to have its maximal effect.  You should continue to have improvement out to 6 weeks after your injection. . Dr. Paulla Fore recommends icing the site of the injection for 20 minutes  1-2 times the day of your injection . You may shower but no swimming, tub bath or Jacuzzi for 24 hours. . If your bandage falls off this does not need to be replaced.  It is appropriate to remove the bandage after 4 hours. . You may resume light activities as tolerated unless otherwise directed per Dr. Paulla Fore during your visit  POSSIBLE STEROID SIDE EFFECTS:  Side effects from injectable steroids tend to be less than when taken orally however you may experience some of the symptoms listed below.  If experienced these should only last for a short period of time. Change in menstrual flow  Edema (swelling)  Increased appetite Skin flushing (redness)  Skin rash/acne  Thrush (oral) Yeast vaginitis    Increased sweating  Depression Increased blood glucose levels Cramping and leg/calf  Euphoria (feeling happy)  POSSIBLE PROCEDURE SIDE EFFECTS: The side effects of the injection are usually fairly minimal however if you may experience some of the following side effects that are  usually self-limited and will is off on their own.  If you are concerned please feel free to call the office with questions:  Increased numbness or tingling  Nausea or vomiting  Swelling or bruising at the injection site   Please call our office if if you experience any of the following symptoms over the next week as these can be signs of infection:   Fever greater than 100.49F  Significant swelling at the injection site  Significant redness or drainage from the injection site  If after 2 weeks you are continuing to have worsening symptoms please call our office to discuss what the next appropriate actions should be including the potential for a return office visit or other diagnostic testing.

## 2016-12-26 NOTE — Progress Notes (Signed)
OFFICE VISIT NOTE [name]. [place], [facility] [place]  Joash Tony - 66 y.o. male MRN [id number]  Date of birth: 1950-06-23  Visit Date: 12/26/2016  PCP: [name], MD   Referred by: [name], MD  Burlene Arnt, CMA acting as scribe for Dr. Paulla Fore.  SUBJECTIVE:   Chief Complaint  Patient presents with  . New Patient (Initial Visit)    bursitis lt elbow   HPI: As below and per problem based documentation when appropriate.  Mr. Bohnenkamp is a new patient presenting today with complaint of bursitis, left elbow.  Swelling has been present for the past 3-4 weeks. He has some pain on the medial aspect of the forearm. He does not recall an recent injury to the elbow. He was seen at [facility] and prescribed Prednisone, this helped with the swelling but it didn't go completely away and then it came right back. He has never had this issue because. He has not iced the elbow. He has tried using compression and got no relief from swelling. Pain on the medial aspect of the forearm is described as constant aching and is tender to palpation. Pain is rate 0.5/10.     Review of Systems  Constitutional: Negative for chills and fever.  Respiratory: Negative for shortness of breath and wheezing.   Cardiovascular: Negative for chest pain and palpitations.  Musculoskeletal: Positive for falls (< 1 mos ago).  Neurological: Negative for dizziness, tingling and headaches.  Endo/Heme/Allergies: Does not bruise/bleed easily.    Otherwise per HPI.  HISTORY & PERTINENT PRIOR DATA:  No specialty comments available. He reports that he quit smoking about 9 years ago. His smoking use included Cigarettes. He has never used smokeless tobacco.   Recent Labs  05/15/16 1030 08/20/16 1013 12/01/16 1111  HGBA1C 7.8* 8.2* 8.1   Medications & Allergies reviewed per EMR Patient Active Problem List   Diagnosis Date Noted    . History of colon polyps 12/01/2016  . Olecranon bursitis of left elbow 12/01/2016  . OSA (obstructive sleep apnea) 07/30/2016  . Low back pain 05/15/2016  . Elevated bilirubin 05/15/2016  . Actinic keratoses 02/07/2016  . Right hip pain 02/07/2016  . Ischemic cardiomyopathy   . Dental abscess 04/22/2015  . Dysphagia 04/22/2015  . Diabetes mellitus type 2, uncontrolled, without complications ([place]) 46/56/8127  . Blood in semen 09/01/2013  . Sleep disturbance 08/08/2013  . Angina at rest [facility]) 04/12/2011  . CAD (coronary artery disease) 04/12/2011  . HTN (hypertension) 04/12/2011  . Hyperlipemia 04/12/2011   Past Medical History:  Diagnosis Date  . Angina   . Asthma   . CAD (coronary artery disease) 04/12/2011   a. remote MI in 1996; b. MI 1997 s/p stenting to LAD & diag; c. MI 2009: long stenting from pLAD to mid LAD and diag & POBA of jailed diag branch; d. cath 2010: 80% ISR of LAD w/ L-R collats, med Rx; e. cath 2012: 50-60% tub mLAD, 60-70% dLAD, FFR 0.75 -->s/p PCI dissection repaired w/ 2 stents (3 total); f. cath 06/2012: no changes from 2012 cath, med Rx, patent stents   . Diabetes mellitus without complication ([place])   . ED (erectile dysfunction)   . Heart disease   . HTN (hypertension) 04/12/2011  . Hyperlipemia 04/12/2011  . Ischemic cardiomyopathy    a. echo 2010: EF 45-50%, mild to mod ant and apical wall HK, trace MR; b. cardiac cath 06/2012:  EF 40%, mild MR   . Myocardial infarction ([place])    x 5  . Obesity   . OSA (obstructive sleep apnea)    on CPAP  . Osteoarthritis    Family History  Problem Relation Age of Onset  . COPD Mother   . Other Father        muscle tumor  . Stroke Father   . Prostate cancer Father        mets  . Heart disease Father   . Alcoholism Father   . Stroke Sister   . Bladder Cancer Neg Hx   . Kidney cancer Neg Hx    Past Surgical History:  Procedure Laterality Date  . CORONARY ANGIOPLASTY     x 5  . CORONARY STENT PLACEMENT      x 7  . LEFT HEART CATHETERIZATION WITH CORONARY ANGIOGRAM N/A 04/14/2011   Procedure: LEFT HEART CATHETERIZATION WITH CORONARY ANGIOGRAM;  Surgeon: Pixie Casino, MD;  Location: [facility] CATH LAB;  Service: Cardiovascular;  Laterality: N/A;  . LEFT HEART CATHETERIZATION WITH CORONARY ANGIOGRAM N/A 06/25/2012   Procedure: LEFT HEART CATHETERIZATION WITH CORONARY ANGIOGRAM;  Surgeon: Peter M Martinique, MD;  Location: [facility] CATH LAB;  Service: Cardiovascular;  Laterality: N/A;  . TONSILLECTOMY AND ADENOIDECTOMY  1955   Social History   Occupational History  . [employer]-retired   . Sales    Social History Main Topics  . Smoking status: Former Smoker    Types: Cigarettes    Quit date: 09/14/2007  . Smokeless tobacco: Never Used     Comment: quit 2009  . Alcohol use No     Comment: rarely 1 beer in last 5 years  . Drug use: No     Comment: pt stopped fall 2012  . Sexual activity: No    OBJECTIVE:  VS:  HT:5\' 8"  (172.7 cm)   WT:197 lb 12.8 oz (89.7 kg)  BMI:30.1    BP:122/74  HR:94bpm  TEMP: ( )  RESP:94 % EXAM: Findings:  WDWN, NAD, Non-toxic appearing Alert & appropriately interactive Not depressed or anxious appearing No increased work of breathing. Pupils are equal. EOM intact without nystagmus No clubbing or cyanosis of the extremities appreciated No significant rashes/lesions/ulcerations overlying the examined area. Radial pulses 2+/4.  No significant generalized UE edema. Sensation intact to light touch in upper extremities.  Left elbow: Very large bursal swelling off the posterior aspect over the olecranon.  He has full flexion extension.  There is no surrounding erythema.  He has no pain with supination pronation.  Grip strength, wrist flexion and wrist extension, elbow flexion and elbow extension are all within normal limits.  The bursal swelling is soft and fluctuant.     [place] Limited Joint Space Structures Up Left(no Linked Charges)  Result Date: 12/26/2016 Gerda Diss, DO      12/26/2016  2:18 PM PROCEDURE NOTE - ULTRASOUND GUIDED ASPIRATION & INJECTION: LEFT olecranon bursa Images were obtained and interpreted by myself, Teresa Coombs, DO Images have been saved and stored to PACS system. Images obtained on: GE S7 Ultrasound machine  ULTRASOUND FINDINGS: Large hypoechoic change within the olecranon bursa consistent with olecranon bursitis. DESCRIPTION OF PROCEDURE: The patient's clinical condition is marked by substantial pain and/or significant functional disability. Other conservative therapy has not provided relief, is contraindicated, or not appropriate. There is a reasonable likelihood that injection will significantly improve the patient's pain and/or functional impairment. After discussing the risks, benefits and expected outcomes of the injection and all  questions were reviewed and answered, the patient wished to undergo the above named procedure. Verbal consent was obtained. The ultrasound was used to identify the target structure and adjacent neurovascular structures. The skin was then prepped in sterile fashion and the target structure was injected under direct visualization using sterile technique as below: PREP: Alcohol, Ethel Chloride, 3cc 1% lidocaine on 25 needle APPROACH: Superiolateral, stopcock technique, 18g 1.5" needle INJECTATE: 1cc of 1% lidocaine, 1cc 80 mg Depo-Medrol ASPIRATE: 10 cc of straw yellow clear fluid DRESSING: Band-Aid and 4" Ace-Wrap Post procedural instructions including recommending icing and warning signs for infection were reviewed. This procedure was well tolerated and there were no complications.  IMPRESSION: Succesful US Guided Aspiration & injection  ASSESSMENT & PLAN:     ICD-10-CM   1. Olecranon bursitis of left elbow M70.22 [place] LIMITED JOINT SPACE STRUCTURES UP LEFT(NO LINKED CHARGES)  ================================================================= Olecranon bursitis of left elbow Aspiration and injection on  12/26/2016. Persistently bothersome and comfortable.  He has noted that he leans on the side quite often.  After discussing all the risks and benefits including increased risk of infection with aspiration he wished to proceed.  Recommend compressive wraps as well as cushioning over the elbow.  Avoidance of direct contact recommended.  Follow-up in 6 weeks if any lack of improvement but he will plan to call to schedule his appointment since he lives almost an hour away. ================================================================= Patient Instructions  Please keep this compressed with the Ace wrap and brace that you have at home whenever you are out of bed.  You can consider using this at nighttime as well if you would like.  I would like to give this a full 6 weeks prior to reevaluating it.  If it has 100% cleared up I do not have to see you back however if it is continuing to be present and is bothering you at all please plan to schedule a follow-up at that time.  ================================================================= Future Appointments Date Time Provider [place]  01/02/2017 11:20 AM Minna Merritts, MD [facility]  03/03/2017 1:30 PM [name], MD [facility] None    Follow-up: Return if symptoms worsen or fail to improve.   CMA/ATC served as Education administrator during this visit. History, Physical, and Plan performed by medical provider. Documentation and orders reviewed and attested to.      Teresa Coombs, [facility] Physician

## 2016-12-26 NOTE — Assessment & Plan Note (Signed)
Aspiration and injection on 12/26/2016. Persistently bothersome and comfortable.  He has noted that he leans on the side quite often.  After discussing all the risks and benefits including increased risk of infection with aspiration he wished to proceed.  Recommend compressive wraps as well as cushioning over the elbow.  Avoidance of direct contact recommended.  Follow-up in 6 weeks if any lack of improvement but he will plan to call to schedule his appointment since he lives almost an hour away.
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
## 2017-01-02 ENCOUNTER — Encounter: Payer: Self-pay | Admitting: Cardiovascular Disease

## 2017-01-02 ENCOUNTER — Ambulatory Visit (INDEPENDENT_AMBULATORY_CARE_PROVIDER_SITE_OTHER): Payer: Medicare Other | Admitting: Cardiovascular Disease

## 2017-01-02 VITALS — BP 138/64 | HR 92 | Ht 68.0 in | Wt 200.2 lb

## 2017-01-02 DIAGNOSIS — I209 Angina pectoris, unspecified: Secondary | ICD-10-CM | POA: Diagnosis not present

## 2017-01-02 DIAGNOSIS — I25118 Atherosclerotic heart disease of native coronary artery with other forms of angina pectoris: Secondary | ICD-10-CM | POA: Diagnosis not present

## 2017-01-02 DIAGNOSIS — I10 Essential (primary) hypertension: Secondary | ICD-10-CM | POA: Diagnosis not present

## 2017-01-02 DIAGNOSIS — E1165 Type 2 diabetes mellitus with hyperglycemia: Secondary | ICD-10-CM

## 2017-01-02 DIAGNOSIS — E7800 Pure hypercholesterolemia, unspecified: Secondary | ICD-10-CM

## 2017-01-02 DIAGNOSIS — I255 Ischemic cardiomyopathy: Secondary | ICD-10-CM

## 2017-01-02 DIAGNOSIS — G4733 Obstructive sleep apnea (adult) (pediatric): Secondary | ICD-10-CM | POA: Diagnosis not present

## 2017-01-02 DIAGNOSIS — I208 Other forms of angina pectoris: Secondary | ICD-10-CM | POA: Diagnosis not present

## 2017-01-02 DIAGNOSIS — IMO0001 Reserved for inherently not codable concepts without codable children: Secondary | ICD-10-CM

## 2017-01-02 NOTE — Patient Instructions (Signed)

## 2017-01-31 ENCOUNTER — Other Ambulatory Visit: Payer: Self-pay | Admitting: Family Medicine

## 2017-03-03 ENCOUNTER — Encounter: Payer: Self-pay | Admitting: Family Medicine

## 2017-03-03 ENCOUNTER — Ambulatory Visit (INDEPENDENT_AMBULATORY_CARE_PROVIDER_SITE_OTHER): Payer: Medicare Other | Admitting: Family Medicine

## 2017-03-03 VITALS — BP 120/70 | HR 92 | Temp 98.2°F | Wt 202.4 lb

## 2017-03-03 DIAGNOSIS — R361 Hematospermia: Secondary | ICD-10-CM | POA: Diagnosis not present

## 2017-03-03 DIAGNOSIS — E279 Disorder of adrenal gland, unspecified: Secondary | ICD-10-CM | POA: Diagnosis not present

## 2017-03-03 DIAGNOSIS — E1165 Type 2 diabetes mellitus with hyperglycemia: Secondary | ICD-10-CM

## 2017-03-03 DIAGNOSIS — G4733 Obstructive sleep apnea (adult) (pediatric): Secondary | ICD-10-CM

## 2017-03-03 DIAGNOSIS — G8929 Other chronic pain: Secondary | ICD-10-CM | POA: Diagnosis not present

## 2017-03-03 DIAGNOSIS — IMO0001 Reserved for inherently not codable concepts without codable children: Secondary | ICD-10-CM

## 2017-03-03 DIAGNOSIS — J069 Acute upper respiratory infection, unspecified: Secondary | ICD-10-CM | POA: Insufficient documentation

## 2017-03-03 DIAGNOSIS — D3500 Benign neoplasm of unspecified adrenal gland: Secondary | ICD-10-CM | POA: Diagnosis not present

## 2017-03-03 DIAGNOSIS — M25511 Pain in right shoulder: Secondary | ICD-10-CM | POA: Diagnosis not present

## 2017-03-03 DIAGNOSIS — I255 Ischemic cardiomyopathy: Secondary | ICD-10-CM

## 2017-03-03 DIAGNOSIS — E278 Other specified disorders of adrenal gland: Secondary | ICD-10-CM

## 2017-03-03 MED ORDER — DEXAMETHASONE 1 MG PO TABS: 1.0000 mg | ORAL_TABLET | Freq: Once | ORAL | 0 refills | Status: AC

## 2017-03-03 MED ORDER — DEXAMETHASONE 1 MG PO TABS: 1.0000 mg | ORAL_TABLET | Freq: Once | ORAL | 0 refills | Status: DC

## 2017-03-03 NOTE — Patient Instructions (Signed)
Nice to see you. We'll have a return for lab work. We'll get you to see Dr. Paulla Fore for your shoulder. You will need to have the lab work at 8 AM. You will take the dexamethasone tablet between 11 PM and 12 AM the night before coming in for lab work.

## 2017-03-03 NOTE — Assessment & Plan Note (Signed)
No recurrent issues. We'll resolve this issue.

## 2017-03-03 NOTE — Progress Notes (Signed)
[name], MD Phone: [phone]  [name] is a 66 y.o. male who presents today for follow-up.  Diabetes: 100-150 CBGs. Taking Jardiance, metformin, Januvia. No polyuria or polydipsia. Not eating as much sugar. Not exercising very much. Occasional lows if he goes to long without eating.  He is recovering from URI. Notes no symptoms today. Felt poorly and had rhinorrhea. Took Zicam and Zyrtec.  OSA: Still using a CPAP nightly for 5-8 hours. Mostly wakes up well rested. He is trying to get new equipment.  Prior evaluation for blood in his semen that he reports is negative. Saw urology for this.  Reports they found a mass on his right kidney on recent scan through urology. On review of imaging it appears to have been an adrenal adenoma that appears benign. No follow-up lab work was completed. Need scan 1 year after original.  Chronic right shoulder pain. Hurts with movements particularly overhead movements. Hurts to sleep on that side. Has not done anything for this.  PMH: former smoker   ROS see history of present illness  Objective  Physical Exam Vitals:   03/03/17 1321  BP: 120/70  Pulse: 92  Temp: 98.2 F (36.8 C)  SpO2: 97%    BP Readings from Last 3 Encounters:  03/03/17 120/70  01/02/17 138/64  12/26/16 122/74   Wt Readings from Last 3 Encounters:  03/03/17 202 lb 6.4 oz (91.8 kg)  01/02/17 200 lb 4 oz (90.8 kg)  12/26/16 197 lb 12.8 oz (89.7 kg)    Physical Exam  Constitutional: No distress.  Cardiovascular: Normal rate, regular rhythm and normal heart sounds.   Pulmonary/Chest: Effort normal and breath sounds normal.  Musculoskeletal: He exhibits no edema.  No shoulder tenderness bilaterally, patient has discomfort on active and passive internal and external range of motion and abduction of the right shoulder, no discomfort on left shoulder range of motion  Neurological: He is alert. Gait normal.  Skin: Skin is warm and dry. He is not  diaphoretic.     Assessment/Plan: Please see individual problem list.  Diabetes mellitus type 2, uncontrolled, without complications (HCC) Check A1c. Continue current medication.  Blood in semen No recurrent issues. We'll resolve this issue.  URI (upper respiratory infection) Resolving. Monitor for recurrence.  OSA (obstructive sleep apnea) He'll continue to work on getting new equipment. If he needs anything from os he will let us know.  Adrenal adenoma With the patient set up for urine catecholamines and metanephrines. Also check a dexamethasone suppression test. Prescribed dexamethasone and given directions on when to take this. CT scan ordered for 12 month follow-up.  Right shoulder pain Possibly rotator cuff related or it could be arthritis. Offered several options for evaluation workup though he deferred these at this time. He will monitor.   Orders Placed This Encounter  Procedures  . Metanephrines, urine, 24 hour    Standing Status:   Future    Standing Expiration Date:   03/03/2018  . Catecholamines, fractionated, Urine, 24 hour    Standing Status:   Future    Standing Expiration Date:   03/03/2018  . Cortisol    Standing Status:   Future    Standing Expiration Date:   03/03/2018  . Comp Met (CMET)    Standing Status:   Future    Standing Expiration Date:   03/03/2018  . HgB A1c    Standing Status:   Future    Standing Expiration Date:   03/03/2018    Meds ordered  this encounter  Medications  . DISCONTD: dexamethasone (DECADRON) 1 MG tablet    Sig: Take 1 tablet (1 mg total) by mouth once. Between 11 pm and 12 am the night before lab draw    Dispense:  1 tablet    Refill:  0  . dexamethasone (DECADRON) 1 MG tablet    Sig: Take 1 tablet (1 mg total) by mouth once. Between 11 pm and 12 am the night before lab draw    Dispense:  1 tablet    Refill:  0    [name], MD [place]

## 2017-03-03 NOTE — Assessment & Plan Note (Addendum)
He'll continue to work on getting new equipment. If he needs anything from os he will let us know.

## 2017-03-03 NOTE — Assessment & Plan Note (Addendum)
We will get the patient set up for urine catecholamines and metanephrines. Also check a dexamethasone suppression test. Prescribed dexamethasone and given directions on when to take this. Attempted to order a follow-up CT scan though it appears that Medicare will not cover this for the diagnosis chosen. We will need to look into coverage for this for the patient.

## 2017-03-03 NOTE — Assessment & Plan Note (Addendum)
Possibly rotator cuff related or it could be arthritis. Offered several options for evaluation workup though he deferred these at this time. He will monitor.

## 2017-03-03 NOTE — Assessment & Plan Note (Signed)
Check A1c.  Continue current medication. °

## 2017-03-03 NOTE — Assessment & Plan Note (Signed)
Resolving. Monitor for recurrence.

## 2017-03-08 ENCOUNTER — Other Ambulatory Visit: Payer: Self-pay | Admitting: Family Medicine

## 2017-03-08 DIAGNOSIS — D3502 Benign neoplasm of left adrenal gland: Secondary | ICD-10-CM

## 2017-03-08 DIAGNOSIS — E278 Other specified disorders of adrenal gland: Secondary | ICD-10-CM

## 2017-04-29 ENCOUNTER — Other Ambulatory Visit: Payer: Self-pay | Admitting: Physician Assistant

## 2017-04-29 ENCOUNTER — Other Ambulatory Visit: Payer: Self-pay | Admitting: Cardiovascular Disease

## 2017-05-02 ENCOUNTER — Other Ambulatory Visit: Payer: Self-pay | Admitting: Family Medicine

## 2017-05-08 ENCOUNTER — Other Ambulatory Visit: Payer: Self-pay | Admitting: Family Medicine

## 2017-06-24 ENCOUNTER — Ambulatory Visit (INDEPENDENT_AMBULATORY_CARE_PROVIDER_SITE_OTHER): Payer: Medicare Other | Admitting: Sports Medicine

## 2017-06-24 ENCOUNTER — Encounter: Payer: Self-pay | Admitting: Sports Medicine

## 2017-06-24 ENCOUNTER — Ambulatory Visit: Payer: Self-pay

## 2017-06-24 VITALS — BP 122/78 | HR 101 | Ht 68.0 in | Wt 201.0 lb

## 2017-06-24 DIAGNOSIS — M7022 Olecranon bursitis, left elbow: Secondary | ICD-10-CM

## 2017-06-24 NOTE — Progress Notes (Signed)
  [name]. [name], [place] [facility] [place]  [name] - 67 y.o. male MRN [id number]  Date of birth: [birth year]  Visit Date: 06/24/2017  PCP: [name], MD   Referred by: [name], MD   Scribe for today's visit: Josepha Pigg, CMA     SUBJECTIVE:  [name] is here for Follow-up (LT elbow pain)  Compared to the last office visit, his previously described symptoms are worsening, he c/o increased swelling.  Current symptoms are moderate & are nonradiating He has been wearing compression sleeve with some relief from swelling. He had steroid injection and asp in the past and tolerated well.    ROS Denies night time disturbances. Denies fevers, chills, or night sweats. Denies unexplained weight loss. Denies personal history of cancer. Denies changes in bowel or bladder habits. Denies recent unreported falls. Denies new or worsening dyspnea or wheezing. Denies headaches or dizziness.  Denies numbness, tingling or weakness  In the extremities.  Denies dizziness or presyncopal episodes Denies lower extremity edema     HISTORY & PERTINENT PRIOR DATA:  Prior History reviewed and updated per electronic medical record.  Significant history, findings, studies and interim changes include:  reports that he quit smoking about 9 years ago. His smoking use included cigarettes. He has never used smokeless tobacco. Recent Labs    08/20/16 1013 12/01/16 1111  HGBA1C 8.2* 8.1   No specialty comments available. No problems updated.  OBJECTIVE:  VS:  HT:5\' 8"  (172.7 cm)   WT:201 lb (91.2 kg)  BMI:30.57    BP:122/78  HR:(Abnormal) 101bpm  TEMP: ( )  RESP:95 %   PHYSICAL EXAM: Constitutional: WDWN, Non-toxic appearing. Psychiatric: Alert & appropriately interactive.  Not depressed or anxious appearing. Respiratory: No increased work of breathing.  Trachea Midline Eyes: Pupils are equal.   EOM intact without nystagmus.  No scleral icterus  NEUROVASCULAR exam: No clubbing or cyanosis appreciated No significant venous stasis changes Capillary Refill: normal, less than 2 seconds   Left elbow is overall well aligned. Large bursal swelling without significant erythema.  Mild pain to palpation. Normal flexion/extension     ASSESSMENT & PLAN:   1. Olecranon bursitis of left elbow    PLAN:  Aspiration and injection today per procedure note   ++++++++++++++++++++++++++++++++++++++++++++ Orders & Meds: Orders Placed This Encounter  Procedures  . [place] MSK POCT ULTRASOUND    No orders of the defined types were placed in this encounter.   ++++++++++++++++++++++++++++++++++++++++++++ Follow-up: Return if symptoms worsen or fail to improve.   Pertinent documentation may be included in additional procedure notes, imaging studies, problem based documentation and patient instructions. Please see these sections of the encounter for additional information regarding this visit. CMA/ATC served as Education administrator during this visit. History, Physical, and Plan performed by medical provider. Documentation and orders reviewed and attested to.      Gerda Diss, [facility] Physician

## 2017-06-24 NOTE — Patient Instructions (Signed)
You had an injection today.  Things to be aware of after injection are listed below: . You may experience no significant improvement or even a slight worsening in your symptoms during the first 24 to 48 hours.  After that we expect your symptoms to improve gradually over the next 2 weeks for the medicine to have its maximal effect.  You should continue to have improvement out to 6 weeks after your injection. . Dr. Kayleeann Huxford recommends icing the site of the injection for 20 minutes  1-2 times the day of your injection . You may shower but no swimming, tub bath or Jacuzzi for 24 hours. . If your bandage falls off this does not need to be replaced.  It is appropriate to remove the bandage after 4 hours. . You may resume light activities as tolerated unless otherwise directed per Dr. Evora Schechter during your visit  POSSIBLE STEROID SIDE EFFECTS:  Side effects from injectable steroids tend to be less than when taken orally however you may experience some of the symptoms listed below.  If experienced these should only last for a short period of time. Change in menstrual flow  Edema (swelling)  Increased appetite Skin flushing (redness)  Skin rash/acne  Thrush (oral) Yeast vaginitis    Increased sweating  Depression Increased blood glucose levels Cramping and leg/calf  Euphoria (feeling happy)  POSSIBLE PROCEDURE SIDE EFFECTS: The side effects of the injection are usually fairly minimal however if you may experience some of the following side effects that are usually self-limited and will is off on their own.  If you are concerned please feel free to call the office with questions:  Increased numbness or tingling  Nausea or vomiting  Swelling or bruising at the injection site   Please call our office if if you experience any of the following symptoms over the next week as these can be signs of infection:   Fever greater than 100.5F  Significant swelling at the injection site  Significant redness or drainage  from the injection site  If after 2 weeks you are continuing to have worsening symptoms please call our office to discuss what the next appropriate actions should be including the potential for a return office visit or other diagnostic testing.    

## 2017-06-24 NOTE — Procedures (Addendum)
PROCEDURE NOTE - ULTRASOUND GUIDED ASPIRATION & INJECTION: LEFT olecranon bursa Images were obtained and interpreted by myself, Teresa Coombs, DO  Images have been saved and stored to PACS system. Images obtained on: GE S7 Ultrasound machine  ULTRASOUND FINDINGS: Large hypoechoic change within the olecranon bursa consistent with olecranon bursitis.  DESCRIPTION OF PROCEDURE:  The patient's clinical condition is marked by substantial pain and/or significant functional disability. Other conservative therapy has not provided relief, is contraindicated, or not appropriate. There is a reasonable likelihood that injection will significantly improve the patient's pain and/or functional impairment. After discussing the risks, benefits and expected outcomes of the injection and all questions were reviewed and answered, the patient wished to undergo the above named procedure. Verbal consent was obtained. The ultrasound was used to identify the target structure and adjacent neurovascular structures. The skin was then prepped in sterile fashion and the target structure was injected under direct visualization using sterile technique as below: PREP: Alcohol, Ethel Chloride, 3cc 1% lidocaine on 25 needle APPROACH: Superiolateral, stopcock technique, 18g 1.5" needle INJECTATE: 1cc of 1% lidocaine, 1cc 80 mg Depo-Medrol ASPIRATE: 12 cc of blood-tinged bursal fluid  DRESSING: Band-Aid and pt's home compression device  Post procedural instructions including recommending icing and warning signs for infection were reviewed. This procedure was well tolerated and there were no complications.   IMPRESSION: Succesful US Guided Aspiration & injection

## 2017-07-01 ENCOUNTER — Encounter: Payer: Self-pay | Admitting: Sports Medicine

## 2017-07-24 ENCOUNTER — Telehealth: Payer: Self-pay | Admitting: Cardiovascular Disease

## 2017-07-24 NOTE — Telephone Encounter (Signed)
Patient states that yesterday he was in court with family member and he was a little stressed and noticed his heart beat skipped a beat or two which resolved once he calmed down. Reviewed that sometimes these are benign but if they increase in frequency he needs to let us know. He verbalized understanding of our conversation, agreement with plan, and had no further questions at this time.

## 2017-07-24 NOTE — Telephone Encounter (Signed)
Patient returning call.

## 2017-07-24 NOTE — Telephone Encounter (Signed)
No answer/Voicemail box is full.  

## 2017-07-24 NOTE — Telephone Encounter (Signed)
Pt states he had an episode of arrhythmia yesterday. States his heart repeatedly skipped a beat. States he had "a funny feeling", states he was breathless. States he is feeling fine today.

## 2017-08-01 ENCOUNTER — Other Ambulatory Visit: Payer: Self-pay | Admitting: Family Medicine

## 2017-08-10 ENCOUNTER — Encounter (INDEPENDENT_AMBULATORY_CARE_PROVIDER_SITE_OTHER): Payer: Self-pay

## 2017-08-14 ENCOUNTER — Encounter: Payer: Self-pay | Admitting: Emergency Medicine

## 2017-08-14 ENCOUNTER — Emergency Department: Payer: Medicare Other

## 2017-08-14 ENCOUNTER — Emergency Department
Admission: EM | Admit: 2017-08-14 | Discharge: 2017-08-14 | Disposition: A | Discharge status: Home / Self Care | Payer: Medicare Other | Attending: Emergency Medicine | Admitting: Emergency Medicine

## 2017-08-14 ENCOUNTER — Other Ambulatory Visit: Payer: Self-pay

## 2017-08-14 DIAGNOSIS — Z7984 Long term (current) use of oral hypoglycemic drugs: Secondary | ICD-10-CM | POA: Insufficient documentation

## 2017-08-14 DIAGNOSIS — Z87891 Personal history of nicotine dependence: Secondary | ICD-10-CM | POA: Insufficient documentation

## 2017-08-14 DIAGNOSIS — Z79899 Other long term (current) drug therapy: Secondary | ICD-10-CM | POA: Insufficient documentation

## 2017-08-14 DIAGNOSIS — B9789 Other viral agents as the cause of diseases classified elsewhere: Secondary | ICD-10-CM

## 2017-08-14 DIAGNOSIS — I2510 Atherosclerotic heart disease of native coronary artery without angina pectoris: Secondary | ICD-10-CM | POA: Insufficient documentation

## 2017-08-14 DIAGNOSIS — R509 Fever, unspecified: Secondary | ICD-10-CM | POA: Diagnosis not present

## 2017-08-14 DIAGNOSIS — J069 Acute upper respiratory infection, unspecified: Secondary | ICD-10-CM | POA: Diagnosis not present

## 2017-08-14 DIAGNOSIS — J45909 Unspecified asthma, uncomplicated: Secondary | ICD-10-CM | POA: Insufficient documentation

## 2017-08-14 DIAGNOSIS — I252 Old myocardial infarction: Secondary | ICD-10-CM | POA: Insufficient documentation

## 2017-08-14 DIAGNOSIS — E119 Type 2 diabetes mellitus without complications: Secondary | ICD-10-CM | POA: Insufficient documentation

## 2017-08-14 DIAGNOSIS — R05 Cough: Secondary | ICD-10-CM | POA: Diagnosis not present

## 2017-08-14 DIAGNOSIS — Z91040 Latex allergy status: Secondary | ICD-10-CM | POA: Insufficient documentation

## 2017-08-14 DIAGNOSIS — I255 Ischemic cardiomyopathy: Secondary | ICD-10-CM | POA: Insufficient documentation

## 2017-08-14 DIAGNOSIS — B349 Viral infection, unspecified: Secondary | ICD-10-CM | POA: Diagnosis not present

## 2017-08-14 DIAGNOSIS — I10 Essential (primary) hypertension: Secondary | ICD-10-CM | POA: Diagnosis not present

## 2017-08-14 DIAGNOSIS — Z7982 Long term (current) use of aspirin: Secondary | ICD-10-CM | POA: Diagnosis not present

## 2017-08-14 DIAGNOSIS — E785 Hyperlipidemia, unspecified: Secondary | ICD-10-CM | POA: Insufficient documentation

## 2017-08-14 DIAGNOSIS — R0981 Nasal congestion: Secondary | ICD-10-CM | POA: Diagnosis not present

## 2017-08-14 MED ORDER — HYDROCODONE-CHLORPHENIRAMINE 5-4 MG/5ML PO SOLN: 5.0000 mL | Freq: Four times a day (QID) | ORAL | 0 refills | Status: DC | PRN

## 2017-08-14 MED ORDER — ALBUTEROL SULFATE HFA 108 (90 BASE) MCG/ACT IN AERS: INHALATION_SPRAY | RESPIRATORY_TRACT | 1 refills | Status: DC

## 2017-08-14 MED ORDER — BENZONATATE 100 MG PO CAPS: 100.0000 mg | ORAL_CAPSULE | Freq: Three times a day (TID) | ORAL | 0 refills | Status: DC | PRN

## 2017-08-14 NOTE — ED Triage Notes (Signed)
Patient ambulatory to triage with steady gait, without difficulty or distress noted; pt reports prod cough yellow sputum several days with sinus drainage

## 2017-08-14 NOTE — Discharge Instructions (Signed)

## 2017-08-14 NOTE — ED Notes (Signed)
PT states he has been sick for several days. He is coughing, throat hurts, and he feels "clogged up". Pt is alert and oriented x 4.

## 2017-08-14 NOTE — ED Provider Notes (Signed)
[facility] Emergency Department Provider Note  ____________________________________________   First MD Initiated Contact with Patient 08/14/17 0543     (approximate)  I have reviewed the triage vital signs and the nursing notes.   HISTORY  Chief Complaint Cough    HPI [name] is a 67 y.o. male with medical history as listed below who presents by private vehicle for evaluation of several days of subjective low-grade fever, body aches, productive cough, and nasal congestion.  He states the symptoms are severe and tonight he came in because he could not sleep due to the frequent coughing.  He is ambulatory without difficulty and nothing in particular makes his symptoms better or worse.  He denies chest pain, shortness of breath except for when coughing, nausea, vomiting, and abdominal pain.  He stopped smoking about 10 years ago and has no history of lung disease such as COPD.  He does have a history of cardiomyopathy but his last ejection fraction was 45-50%.  Past Medical History:  Diagnosis Date  . Angina   . Asthma   . Bursitis of left elbow 2018  . CAD (coronary artery disease) 04/12/2011   a. remote MI in 1996; b. MI 1997 s/p stenting to LAD & diag; c. MI 2009: long stenting from pLAD to mid LAD and diag & POBA of jailed diag branch; d. cath 2010: 80% ISR of LAD w/ L-R collats, med Rx; e. cath 2012: 50-60% tub mLAD, 60-70% dLAD, FFR 0.75 -->s/p PCI dissection repaired w/ 2 stents (3 total); f. cath 06/2012: no changes from 2012 cath, med Rx, patent stents   . Diabetes mellitus without complication ([place])   . ED (erectile dysfunction)   . Heart disease   . HTN (hypertension) 04/12/2011  . Hyperlipemia 04/12/2011  . Ischemic cardiomyopathy    a. echo 2010: EF 45-50%, mild to mod ant and apical wall HK, trace MR; b. cardiac cath 06/2012: EF 40%, mild MR   . Myocardial infarction ([place])    x 5  . Obesity   . OSA (obstructive sleep apnea)    on  CPAP  . Osteoarthritis     Patient Active Problem List   Diagnosis Date Noted  . URI (upper respiratory infection) 03/03/2017  . 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 Right shoulder pain 03/03/2017  . History of colon polyps 12/01/2016  . Olecranon bursitis of left elbow 12/01/2016  . OSA (obstructive sleep apnea) 07/30/2016  . Low back pain 05/15/2016  . Elevated bilirubin 05/15/2016  . Actinic keratoses 02/07/2016  . Right hip pain 02/07/2016  . Ischemic cardiomyopathy   . Dental abscess 04/22/2015  . Dysphagia 04/22/2015  . Diabetes mellitus type 2, uncontrolled, without complications ([place]) 68/07/2120  . Sleep disturbance 08/08/2013  . Angina at rest [facility]) 04/12/2011  . CAD (coronary artery disease) 04/12/2011  . HTN (hypertension) 04/12/2011  . Hyperlipemia 04/12/2011    Past Surgical History:  Procedure Laterality Date  . CORONARY ANGIOPLASTY     x 5  . CORONARY STENT PLACEMENT     x 7  . LEFT HEART CATHETERIZATION WITH CORONARY ANGIOGRAM N/A 04/14/2011   Procedure: LEFT HEART CATHETERIZATION WITH CORONARY ANGIOGRAM;  Surgeon: Pixie Casino, MD;  Location: [facility] CATH LAB;  Service: Cardiovascular;  Laterality: N/A;  . LEFT HEART CATHETERIZATION WITH CORONARY ANGIOGRAM N/A 06/25/2012   Procedure: LEFT HEART CATHETERIZATION WITH CORONARY ANGIOGRAM;  Surgeon: Peter M Martinique, MD;  Location: [facility] CATH LAB;  Service: Cardiovascular;  Laterality: N/A;  .  TONSILLECTOMY AND ADENOIDECTOMY  1955    Prior to Admission medications   Medication Sig Start Date End Date Taking? Authorizing Provider  albuterol (PROVENTIL HFA;VENTOLIN HFA) 108 (90 Base) MCG/ACT inhaler Inhale 2-4 puffs by mouth every 4 hours as needed for wheezing, cough, and/or shortness of breath 08/14/17   Hinda Kehr, MD  aspirin EC 81 MG tablet Take 1 tablet (81 mg total) by mouth daily. 10/02/16   Dunn, Areta Haber, PA-C  atorvastatin (LIPITOR) 80 MG tablet Take 1 tablet (80 mg total) by mouth daily. 06/19/16   Minna Merritts, MD   benzonatate (TESSALON PERLES) 100 MG capsule Take 1 capsule (100 mg total) by mouth 3 (three) times daily as needed for cough. 08/14/17   Hinda Kehr, MD  blood glucose meter kit and supplies KIT Dispense based on patient and insurance preference. Use once daily fasting. (FOR ICD 10 DIAGNOSIS OF E11.59). 05/15/16   [name], MD  carvedilol (COREG) 6.25 MG tablet Take 1 tablet (6.25 mg total) by mouth 2 (two) times daily with a meal. 10/02/16   Dunn, Areta Haber, PA-C  HYDROcodone-Chlorpheniramine 5-4 MG/5ML SOLN Take 5 mLs by mouth every 6 (six) hours as needed. 08/14/17   Hinda Kehr, MD  JANUVIA 100 MG tablet TAKE 1 TABLET DAILY 05/08/17   [name], MD  JARDIANCE 25 MG TABS tablet TAKE 1 TABLET DAILY 08/03/17   [name], MD  Lactobacillus (ACIDOPHILUS PROBIOTIC) 100 MG CAPS Take 1 capsule (100 mg total) by mouth daily. Patient taking differently: Take 100 mg by mouth daily as needed.  04/23/15   Bettey Costa, MD  metFORMIN (GLUCOPHAGE) 1000 MG tablet Take 1 tablet (1,000 mg total) by mouth 2 (two) times daily with a meal. 07/30/16   [name], MD  Multiple Vitamins-Minerals (MULTIVITAMINS THER. W/MINERALS) TABS Take 1 tablet by mouth every morning.     [provider]  NIASPAN 1000 MG CR tablet TAKE 2 TABLETS (2000 MG TOTAL) AT BEDTIME 04/29/17   Gollan, Kathlene November, MD  nitroGLYCERIN (NITROLINGUAL) 0.4 MG/SPRAY spray Place 1 spray under the tongue every 5 (five) minutes as needed for chest pain. 11/17/14   Rubbie Battiest, RN  prasugrel (EFFIENT) 10 MG TABS tablet Take 1 tablet (10 mg total) by mouth daily. 06/19/16   Minna Merritts, MD  ramipril (ALTACE) 2.5 MG capsule TAKE 1 CAPSULE EVERY MORNING 04/29/17   Minna Merritts, MD  ranolazine (RANEXA) 1000 MG SR tablet Take 1 tablet (1,000 mg total) by mouth 2 (two) times daily. 05/20/16   Minna Merritts, MD  sildenafil (VIAGRA) 100 MG tablet Take 1 tablet (100 mg total) by mouth daily as needed for erectile  dysfunction. 03/14/13   Minna Merritts, MD  sitaGLIPtin (JANUVIA) 100 MG tablet Take 1 tablet (100 mg total) by mouth daily. 08/29/16   [name], MD    Allergies Atarax [hydroxyzine hcl] and Latex  Family History  Problem Relation Age of Onset  . COPD Mother   . Other Father        muscle tumor  . Stroke Father   . Prostate cancer Father        mets  . Heart disease Father   . Alcoholism Father   . Stroke Sister   . Bladder Cancer Neg Hx   . Kidney cancer Neg Hx     Social History Social History   Tobacco Use  . Smoking status: Former Smoker    Types: Cigarettes  Last attempt to quit: 09/14/2007    Years since quitting: 9.9  . Smokeless tobacco: Never Used  . Tobacco comment: quit 2009  Substance Use Topics  . Alcohol use: No    Alcohol/week: 0.0 oz    Comment: rarely 1 beer in last 5 years  . Drug use: No    Types: Marijuana    Comment: pt stopped fall 2012    Review of Systems Constitutional: Subjective fever/chills Eyes: No visual changes. ENT: Severe nasal congestion recently Cardiovascular: Denies chest pain. Respiratory: Frequent productive cough.  Denies shortness of breath except when he is having a coughing episode Gastrointestinal: No abdominal pain.  No nausea, no vomiting.  No diarrhea.  No constipation. Genitourinary: Negative for dysuria. Musculoskeletal: Negative for neck pain.  Negative for back pain. Integumentary: Negative for rash. Neurological: Negative for headaches, focal weakness or numbness.   ____________________________________________   PHYSICAL EXAM:  VITAL SIGNS: ED Triage Vitals  Enc Vitals Group     BP 08/14/17 0214 132/71     Pulse Rate 08/14/17 0214 (!) 103     Resp 08/14/17 0214 18     Temp 08/14/17 0214 98.6 F (37 C)     Temp Source 08/14/17 0214 Oral     SpO2 08/14/17 0214 97 %     Weight 08/14/17 0204 90.7 kg (200 lb)     Height 08/14/17 0204 1.727 m (_0 )     Head Circumference --      Peak  Flow --      Pain Score 08/14/17 0203 0     Pain Loc --      Pain Edu? --      Excl. in [place]? --     Constitutional: Alert and oriented. Well appearing and in no acute distress. Eyes: Conjunctivae are normal.  Head: Atraumatic. Nose: Significant nasal congestion  Neck: No stridor.  No meningeal signs.   Cardiovascular: Normal rate, regular rhythm. Good peripheral circulation. Grossly normal heart sounds. Respiratory: Normal respiratory effort.  No retractions. Lungs CTAB. Gastrointestinal: Soft and nontender. No distention.  Musculoskeletal: No lower extremity tenderness nor edema. No gross deformities of extremities. Neurologic:  Normal speech and language. No gross focal neurologic deficits are appreciated.  Skin:  Skin is warm, dry and intact. No rash noted. Psychiatric: Mood and affect are normal. Speech and behavior are normal.  ____________________________________________   LABS (all labs ordered are listed, but only abnormal results are displayed)  Labs Reviewed - No data to display ____________________________________________  EKG  None - EKG not ordered by ED physician ____________________________________________  RADIOLOGY I, Hinda Kehr, personally viewed and evaluated these images (plain radiographs) as part of my medical decision making, as well as reviewing the written report by the radiologist.  ED MD interpretation: No indication of acute infiltrate or pulmonary edema  Official radiology report(s): Dg Chest 2 View  Result Date: 08/14/2017 CLINICAL DATA:  Productive cough EXAM: CHEST - 2 VIEW COMPARISON:  06/23/2012 FINDINGS: The heart size and mediastinal contours are within normal limits. Both lungs are clear. The visualized skeletal structures are unremarkable. IMPRESSION: No active cardiopulmonary disease. Electronically Signed   By: Donavan Foil M.D.   On: 08/14/2017 02:53    ____________________________________________   PROCEDURES  Critical Care  performed: No   Procedure(s) performed:   Procedures   ____________________________________________   INITIAL IMPRESSION / ASSESSMENT AND PLAN / ED COURSE  As part of my medical decision making, I reviewed the following data within the [place]  Nursing notes reviewed and incorporated and Radiograph reviewed     Signs and symptoms are most consistent with viral illness although community acquired pneumonia is also possible.  Given his history of heart issues pulmonary edema was also possible but the patient is in no acute distress and his chest x-ray is reassuring.  Given the other signs and symptoms of viral illness, I think this is relatively straightforward viral bronchitis or viral respiratory infection with cough.  I discussed all this with the patient and he agrees with the plan for symptomatic treatment with cough medicine including Tessalon and an albuterol inhaler for the bronchospasm.  No indication for antibiotics at this time.  He is afebrile and his vital signs are reassuring.  I gave my usual and customary return precautions.      ____________________________________________  FINAL CLINICAL IMPRESSION(S) / ED DIAGNOSES  Final diagnoses:  Viral URI with cough     MEDICATIONS GIVEN DURING THIS VISIT:  Medications - No data to display   ED Discharge Orders        Ordered    albuterol (PROVENTIL HFA;VENTOLIN HFA) 108 (90 Base) MCG/ACT inhaler     08/14/17 0549    HYDROcodone-Chlorpheniramine 5-4 MG/5ML SOLN  Every 6 hours PRN     08/14/17 0549    benzonatate (TESSALON PERLES) 100 MG capsule  3 times daily PRN     08/14/17 0549       Note:  This document was prepared using Dragon voice recognition software and may include unintentional dictation errors.    Hinda Kehr, MD 08/14/17 [phone]

## 2017-08-14 NOTE — ED Notes (Signed)
Patient discharged to home per MD order. Patient in stable condition, and deemed medically cleared by ED provider for discharge. Discharge instructions reviewed with patient/family using "Teach Back"; verbalized understanding of medication education and administration, and information about follow-up care. Denies further concerns. ° °

## 2017-08-21 ENCOUNTER — Other Ambulatory Visit: Payer: Self-pay | Admitting: *Deleted

## 2017-08-21 ENCOUNTER — Telehealth: Payer: Self-pay | Admitting: Cardiovascular Disease

## 2017-08-21 ENCOUNTER — Other Ambulatory Visit: Payer: Self-pay | Admitting: Family Medicine

## 2017-08-21 DIAGNOSIS — I25119 Atherosclerotic heart disease of native coronary artery with unspecified angina pectoris: Secondary | ICD-10-CM

## 2017-08-21 MED ORDER — NIACIN ER (ANTIHYPERLIPIDEMIC) 1000 MG PO TBCR: EXTENDED_RELEASE_TABLET | ORAL | 0 refills | Status: DC

## 2017-08-21 MED ORDER — ATORVASTATIN CALCIUM 80 MG PO TABS: 80.0000 mg | ORAL_TABLET | Freq: Every day | ORAL | 3 refills | Status: DC

## 2017-08-21 MED ORDER — NIACIN ER (ANTIHYPERLIPIDEMIC) 1000 MG PO TBCR: EXTENDED_RELEASE_TABLET | ORAL | 3 refills | Status: DC

## 2017-08-21 MED ORDER — ATORVASTATIN CALCIUM 80 MG PO TABS: 80.0000 mg | ORAL_TABLET | Freq: Every day | ORAL | 0 refills | Status: DC

## 2017-08-21 NOTE — Telephone Encounter (Signed)
Requested Prescriptions   Signed Prescriptions Disp Refills  . atorvastatin (LIPITOR) 80 MG tablet 14 tablet 0    Sig: Take 1 tablet (80 mg total) by mouth daily.    Authorizing Provider: Minna Merritts    Ordering User: Britt Bottom niacin (NIASPAN) 1000 MG CR tablet 28 tablet 0    Sig: TAKE 2 TABLETS (2000 MG TOTAL) AT BEDTIME    Authorizing Provider: Minna Merritts    Ordering User: Britt Bottom   Requested Prescriptions   Signed Prescriptions Disp Refills  . niacin (NIASPAN) 1000 MG CR tablet 180 tablet 3    Sig: TAKE 2 TABLETS (2000 MG TOTAL) AT BEDTIME    Authorizing Provider: Minna Merritts    Ordering User: Eugenio Hoes, Briella Hobday C  . atorvastatin (LIPITOR) 80 MG tablet 90 tablet 3    Sig: Take 1 tablet (80 mg total) by mouth daily.    Authorizing Provider: Minna Merritts    Ordering User: Britt Bottom

## 2017-08-21 NOTE — Telephone Encounter (Signed)
Patient is requesting refill: LOV: 03/03/17- no f/u recorded- last A1c 08/10/16( POC in office 12/01/16- no lab since)  Not sure where he fits in protocol- if POC counts as A1c within last 12 months.  Rx RF pended for provider review. Metformin 1000 mg  LOV: 03/03/17  PCP: [place]: [facility]- 58 day                    [facility] - short term supply

## 2017-08-21 NOTE — Telephone Encounter (Signed)
Requested Prescriptions   Signed Prescriptions Disp Refills  . niacin (NIASPAN) 1000 MG CR tablet 180 tablet 3    Sig: TAKE 2 TABLETS (2000 MG TOTAL) AT BEDTIME    Authorizing Provider: Minna Merritts    Ordering User: Eugenio Hoes, Tashari Schoenfelder C  . atorvastatin (LIPITOR) 80 MG tablet 90 tablet 3    Sig: Take 1 tablet (80 mg total) by mouth daily.    Authorizing Provider: Minna Merritts    Ordering User: Britt Bottom   Requested Prescriptions   Signed Prescriptions Disp Refills  . atorvastatin (LIPITOR) 80 MG tablet 14 tablet 0    Sig: Take 1 tablet (80 mg total) by mouth daily.    Authorizing Provider: Minna Merritts    Ordering User: Britt Bottom niacin (NIASPAN) 1000 MG CR tablet 28 tablet 0    Sig: TAKE 2 TABLETS (2000 MG TOTAL) AT BEDTIME    Authorizing Provider: Minna Merritts    Ordering User: Britt Bottom

## 2017-08-21 NOTE — Telephone Encounter (Signed)
Pt calling needing refills   *STAT* If patient is at the pharmacy, call can be transferred to refill team.   1. Which medications need to be refilled? (please list name of each medication and dose if known)  Lipitor and Niaspan   2. Which pharmacy/location (including street and city if local pharmacy) is medication to be sent to[facility] in [place] just until mail order comes   3. Do they need a 30 day or 90 day supply? 90 day

## 2017-08-21 NOTE — Telephone Encounter (Signed)
Copied from [place] [phone]. Topic: Quick Communication - Rx Refill/Question >> Aug 21, 2017 10:35 AM Waylan Rocher, Lumin L wrote: Medication: metFORMIN (GLUCOPHAGE) 1000 MG tablet (90 day supply) out of script Has the patient contacted their pharmacy? Yes.   (Agent: If no, request that the patient contact the pharmacy for the refill.) Preferred Pharmacy (with phone number or street name): [facility] - [address], [place] [address] Phone: [phone] Fax: [phone] Agent: Please be advised that RX refills may take up to 3 business days. We ask that you follow-up with your pharmacy.  Patient would also like [facility] supply to him.

## 2017-08-25 ENCOUNTER — Ambulatory Visit
Admission: RE | Admit: 2017-08-25 | Discharge: 2017-08-25 | Disposition: A | Discharge status: Home / Self Care | Payer: Medicare Other | Source: Ambulatory Visit | Attending: Family Medicine | Admitting: Family Medicine

## 2017-08-25 DIAGNOSIS — K5730 Diverticulosis of large intestine without perforation or abscess without bleeding: Secondary | ICD-10-CM | POA: Diagnosis not present

## 2017-08-25 DIAGNOSIS — D3502 Benign neoplasm of left adrenal gland: Secondary | ICD-10-CM | POA: Diagnosis present

## 2017-08-25 DIAGNOSIS — E279 Disorder of adrenal gland, unspecified: Secondary | ICD-10-CM | POA: Insufficient documentation

## 2017-08-25 DIAGNOSIS — I700 Atherosclerosis of aorta: Secondary | ICD-10-CM | POA: Insufficient documentation

## 2017-08-25 DIAGNOSIS — N200 Calculus of kidney: Secondary | ICD-10-CM | POA: Insufficient documentation

## 2017-08-25 DIAGNOSIS — K4090 Unilateral inguinal hernia, without obstruction or gangrene, not specified as recurrent: Secondary | ICD-10-CM | POA: Diagnosis not present

## 2017-08-25 LAB — POCT I-STAT CREATININE: CREATININE: 0.9 mg/dL (ref 0.61–1.24)

## 2017-08-25 MED ORDER — IOPAMIDOL (ISOVUE-300) INJECTION 61%
100.0000 mL | Freq: Once | INTRAVENOUS | Status: AC | PRN
  Administered 2017-08-25: 100 mL via INTRAVENOUS

## 2017-08-26 ENCOUNTER — Telehealth: Payer: Self-pay

## 2017-08-26 NOTE — Telephone Encounter (Signed)
-----   Message from [name], MD sent at 08/25/2017  5:56 PM EDT ----- Please let the patient know that the adrenal nodule appears to be benign and is stable.  He did not complete the prior lab evaluation that was needed.  He does not need further imaging though it does need to complete the labs that have been previously ordered.  He appears to have a small stable fat-containing left inguinal hernia as well.  He additionally has atherosclerosis in his aorta for which treatment is trying to control his risk factors which we are already trying to do. Thanks.

## 2017-08-26 NOTE — Telephone Encounter (Signed)
Pt did not want to continue holding to review results but would like call back to discuss.

## 2017-08-26 NOTE — Telephone Encounter (Signed)
Left voicemail for patient to call office about results as well as sent his results through my chart. [place] for [facility] to give patient results if he call the office back.

## 2017-08-27 NOTE — Telephone Encounter (Signed)
Patient called and asked did he receive his results, he says he read it in [place].

## 2017-08-27 NOTE — Telephone Encounter (Signed)
Left message to call back with questions or concerns.

## 2017-09-01 MED ORDER — METFORMIN HCL 1000 MG PO TABS: 1000.0000 mg | ORAL_TABLET | Freq: Two times a day (BID) | ORAL | 3 refills | Status: DC

## 2017-09-08 ENCOUNTER — Ambulatory Visit (INDEPENDENT_AMBULATORY_CARE_PROVIDER_SITE_OTHER): Payer: Medicare Other | Admitting: Sports Medicine

## 2017-09-08 ENCOUNTER — Ambulatory Visit: Payer: Self-pay

## 2017-09-08 ENCOUNTER — Encounter: Payer: Self-pay | Admitting: Sports Medicine

## 2017-09-08 VITALS — BP 112/72 | HR 91 | Ht 68.0 in | Wt 199.6 lb

## 2017-09-08 DIAGNOSIS — M25562 Pain in left knee: Secondary | ICD-10-CM

## 2017-09-08 DIAGNOSIS — I25119 Atherosclerotic heart disease of native coronary artery with unspecified angina pectoris: Secondary | ICD-10-CM

## 2017-09-08 DIAGNOSIS — G8929 Other chronic pain: Secondary | ICD-10-CM

## 2017-09-08 NOTE — Procedures (Signed)
PROCEDURE NOTE:  Ultrasound Guided: Injection: Left knee Images were obtained and interpreted by myself, Teresa Coombs, DO  Images have been saved and stored to PACS system. Images obtained on: GE S7 Ultrasound machine    DESCRIPTION OF PROCEDURE:  The patient's clinical condition is marked by substantial pain and/or significant functional disability. Other conservative therapy has not provided relief, is contraindicated, or not appropriate. There is a reasonable likelihood that injection will significantly improve the patient's pain and/or functional impairment.   After discussing the risks, benefits and expected outcomes of the injection and all questions were reviewed and answered, the patient wished to undergo the above named procedure.  Verbal consent was obtained.  The ultrasound was used to identify the target structure and adjacent neurovascular structures. The skin was then prepped in sterile fashion and the target structure was injected under direct visualization using sterile technique as below:  PREP: Alcohol and Ethel Chloride APPROACH: superiolateral, single injection, 25g 1.5 in. INJECTATE: 2 cc 0.5% Marcaine and 2 cc 40mg /mL DepoMedrol ASPIRATE: None DRESSING: Band-Aid  Post procedural instructions including recommending icing and warning signs for infection were reviewed.    This procedure was well tolerated and there were no complications.   IMPRESSION: Succesful Ultrasound Guided: Injection

## 2017-09-08 NOTE — Patient Instructions (Signed)

## 2017-09-08 NOTE — Progress Notes (Signed)
  [name]. Tamikka Pilger, [place] [facility] [place]  Berle Fitz - 67 y.o. male MRN [id number]  Date of birth: Nov 29, 1950  Visit Date: 09/08/2017  PCP: [name], MD   Referred by: [name], MD  Scribe for today's visit: Wendy Poet, LAT, ATC     SUBJECTIVE:  Eryc Bodey is here for Initial Assessment (L knee pain) .   His L knee pain symptoms INITIALLY: Began years ago w/ no specific MOI.  States that he was in the [employer] x 20 years.  He feels like his L knee is getting weaker which he notices when he ascends stairs. Described as mild discomfort, nonradiating Worsened with transitions from sitting to standing and climbing stairs Improved with knee brace Additional associated symptoms include: notes some popping/clicking in his L knee.  No swelling noted.    At this time symptoms are worsening compared to onset  He has been using OTC knee braces.  ROS Denies night time disturbances. Denies fevers, chills, or night sweats. Denies unexplained weight loss. Denies personal history of cancer. Denies changes in bowel or bladder habits. Denies recent unreported falls. Denies new or worsening dyspnea or wheezing. Denies headaches or dizziness.  Denies numbness, tingling or weakness  In the extremities.  Denies dizziness or presyncopal episodes Denies lower extremity edema    HISTORY & PERTINENT PRIOR DATA:  Prior History reviewed and updated per electronic medical record.  Significant/pertinent history, findings, studies include:  reports that he quit smoking about 10 years ago. His smoking use included cigarettes. He has never used smokeless tobacco. Recent Labs    12/01/16 1111  HGBA1C 8.1   No specialty comments available. No problems updated.  OBJECTIVE:  VS:  HT:5\' 8"  (172.7 cm)   WT:199 lb 9.6 oz (90.5 kg)  BMI:30.36    BP:112/72  HR:91bpm  TEMP: ( )  RESP:94 %   PHYSICAL  EXAM: Constitutional: WDWN, Non-toxic appearing. Psychiatric: Alert & appropriately interactive.  Not depressed or anxious appearing. Respiratory: No increased work of breathing.  Trachea Midline Eyes: Pupils are equal.  EOM intact without nystagmus.  No scleral icterus  Vascular Exam: warm to touch no edema  lower extremity neuro exam: unremarkable normal strength normal sensation normal reflexes  MSK Exam: Left knee is overall well aligned without significant deformity.  He has a small amount of medial and lateral joint line pain.  Stable to varus and valgus testing.  Stable anterior posterior drawer.  Mild pain with McMurray's.  Some pain with Thessaly.   ASSESSMENT & PLAN:   1. Chronic pain of left knee     PLAN: Injection performed today.  We will plan to follow-up with him as needed.  Follow-up: Return if symptoms worsen or fail to improve.      Please see additional documentation for Objective, Assessment and Plan sections. Pertinent additional documentation may be included in corresponding procedure notes, imaging studies, problem based documentation and patient instructions. Please see these sections of the encounter for additional information regarding this visit.  CMA/ATC served as Education administrator during this visit. History, Physical, and Plan performed by medical provider. Documentation and orders reviewed and attested to.      Gerda Diss, [place] [facility] Physician

## 2017-09-19 ENCOUNTER — Encounter: Payer: Self-pay | Admitting: Sports Medicine

## 2017-10-10 ENCOUNTER — Emergency Department: Payer: Medicare Other

## 2017-10-10 ENCOUNTER — Encounter: Payer: Self-pay | Admitting: Emergency Medicine

## 2017-10-10 ENCOUNTER — Other Ambulatory Visit: Payer: Self-pay

## 2017-10-10 ENCOUNTER — Emergency Department
Admission: EM | Admit: 2017-10-10 | Discharge: 2017-10-10 | Disposition: A | Discharge status: Home / Self Care | Payer: Medicare Other | Attending: Emergency Medicine | Admitting: Emergency Medicine

## 2017-10-10 DIAGNOSIS — I10 Essential (primary) hypertension: Secondary | ICD-10-CM | POA: Diagnosis not present

## 2017-10-10 DIAGNOSIS — I252 Old myocardial infarction: Secondary | ICD-10-CM | POA: Diagnosis not present

## 2017-10-10 DIAGNOSIS — S4992XA Unspecified injury of left shoulder and upper arm, initial encounter: Secondary | ICD-10-CM | POA: Diagnosis not present

## 2017-10-10 DIAGNOSIS — Z87891 Personal history of nicotine dependence: Secondary | ICD-10-CM | POA: Diagnosis not present

## 2017-10-10 DIAGNOSIS — S50312A Abrasion of left elbow, initial encounter: Secondary | ICD-10-CM | POA: Diagnosis not present

## 2017-10-10 DIAGNOSIS — J45909 Unspecified asthma, uncomplicated: Secondary | ICD-10-CM | POA: Insufficient documentation

## 2017-10-10 DIAGNOSIS — M7989 Other specified soft tissue disorders: Secondary | ICD-10-CM | POA: Diagnosis not present

## 2017-10-10 DIAGNOSIS — E119 Type 2 diabetes mellitus without complications: Secondary | ICD-10-CM | POA: Diagnosis not present

## 2017-10-10 DIAGNOSIS — Y999 Unspecified external cause status: Secondary | ICD-10-CM | POA: Diagnosis not present

## 2017-10-10 DIAGNOSIS — I2510 Atherosclerotic heart disease of native coronary artery without angina pectoris: Secondary | ICD-10-CM | POA: Insufficient documentation

## 2017-10-10 DIAGNOSIS — Z79899 Other long term (current) drug therapy: Secondary | ICD-10-CM | POA: Diagnosis not present

## 2017-10-10 DIAGNOSIS — Z91040 Latex allergy status: Secondary | ICD-10-CM | POA: Insufficient documentation

## 2017-10-10 DIAGNOSIS — S40812A Abrasion of left upper arm, initial encounter: Secondary | ICD-10-CM | POA: Diagnosis not present

## 2017-10-10 DIAGNOSIS — S63502A Unspecified sprain of left wrist, initial encounter: Secondary | ICD-10-CM | POA: Insufficient documentation

## 2017-10-10 DIAGNOSIS — S43422A Sprain of left rotator cuff capsule, initial encounter: Secondary | ICD-10-CM | POA: Insufficient documentation

## 2017-10-10 DIAGNOSIS — Y9389 Activity, other specified: Secondary | ICD-10-CM | POA: Diagnosis not present

## 2017-10-10 DIAGNOSIS — Z7984 Long term (current) use of oral hypoglycemic drugs: Secondary | ICD-10-CM | POA: Insufficient documentation

## 2017-10-10 DIAGNOSIS — M7150 Other bursitis, not elsewhere classified, unspecified site: Secondary | ICD-10-CM

## 2017-10-10 DIAGNOSIS — M7032 Other bursitis of elbow, left elbow: Secondary | ICD-10-CM | POA: Diagnosis not present

## 2017-10-10 DIAGNOSIS — Z7982 Long term (current) use of aspirin: Secondary | ICD-10-CM | POA: Insufficient documentation

## 2017-10-10 DIAGNOSIS — S6992XA Unspecified injury of left wrist, hand and finger(s), initial encounter: Secondary | ICD-10-CM | POA: Diagnosis not present

## 2017-10-10 DIAGNOSIS — Y929 Unspecified place or not applicable: Secondary | ICD-10-CM | POA: Diagnosis not present

## 2017-10-10 DIAGNOSIS — M25532 Pain in left wrist: Secondary | ICD-10-CM | POA: Diagnosis not present

## 2017-10-10 DIAGNOSIS — T148XXA Other injury of unspecified body region, initial encounter: Secondary | ICD-10-CM

## 2017-10-10 DIAGNOSIS — S40811A Abrasion of right upper arm, initial encounter: Secondary | ICD-10-CM | POA: Diagnosis not present

## 2017-10-10 DIAGNOSIS — S4982XA Other specified injuries of left shoulder and upper arm, initial encounter: Secondary | ICD-10-CM | POA: Diagnosis present

## 2017-10-10 MED ORDER — HYDROCODONE-ACETAMINOPHEN 5-325 MG PO TABS: 1.0000 | ORAL_TABLET | Freq: Four times a day (QID) | ORAL | 0 refills | Status: AC | PRN

## 2017-10-10 MED ORDER — HYDROCODONE-ACETAMINOPHEN 5-325 MG PO TABS
1.0000 | ORAL_TABLET | Freq: Once | ORAL | Status: AC
  Administered 2017-10-10: 1 via ORAL
  Filled 2017-10-10: qty 1

## 2017-10-10 MED ORDER — BACITRACIN ZINC 500 UNIT/GM EX OINT
1.0000 "application " | TOPICAL_OINTMENT | Freq: Once | CUTANEOUS | Status: AC
  Administered 2017-10-10: 1 via TOPICAL
  Filled 2017-10-10: qty 0.9

## 2017-10-10 NOTE — ED Notes (Signed)
Left shoulder, left wrist pain s/p fall. Denies numbness or tingling. Able to move fingers. Decrease rom shoulder.

## 2017-10-10 NOTE — ED Provider Notes (Signed)
[facility] Emergency Department Provider Note ____________________________________________  Time seen: Approximately 2:06 PM  I have reviewed the triage vital signs and the nursing notes.   HISTORY  Chief Complaint Shoulder Pain    HPI [name] is a 67 y.o. male who presents to the emergency department for evaluation and treatment of left shoulder, elbow, and wrist pain after falling out of the back of a pickup truck prior to arrival.  Patient states that he just lost his footing and fell backward.  He denies striking his head or experiencing any loss of consciousness.  Pain in the shoulder prevents him from lifting his arm over his head secondary to pain.  He has a history of left wrist sprain, but has not had any prior fracture. He also has a history of left elbow bursitis. No alleviating measures were attempted prior to arrival.  Past Medical History:  Diagnosis Date  . Angina   . Asthma   . Bursitis of left elbow 2018  . CAD (coronary artery disease) 04/12/2011   a. remote MI in 1996; b. MI 1997 s/p stenting to LAD & diag; c. MI 2009: long stenting from pLAD to mid LAD and diag & POBA of jailed diag branch; d. cath 2010: 80% ISR of LAD w/ L-R collats, med Rx; e. cath 2012: 50-60% tub mLAD, 60-70% dLAD, FFR 0.75 -->s/p PCI dissection repaired w/ 2 stents (3 total); f. cath 06/2012: no changes from 2012 cath, med Rx, patent stents   . Diabetes mellitus without complication ([place])   . ED (erectile dysfunction)   . Heart disease   . HTN (hypertension) 04/12/2011  . Hyperlipemia 04/12/2011  . Ischemic cardiomyopathy    a. echo 2010: EF 45-50%, mild to mod ant and apical wall HK, trace MR; b. cardiac cath 06/2012: EF 40%, mild MR   . Myocardial infarction ([place])    x 5  . Obesity   . OSA (obstructive sleep apnea)    on CPAP  . Osteoarthritis     Patient Active Problem List   Diagnosis Date Noted  . URI (upper respiratory infection) 03/03/2017  .  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 Right shoulder pain 03/03/2017  . History of colon polyps 12/01/2016  . Olecranon bursitis of left elbow 12/01/2016  . OSA (obstructive sleep apnea) 07/30/2016  . Low back pain 05/15/2016  . Elevated bilirubin 05/15/2016  . Actinic keratoses 02/07/2016  . Right hip pain 02/07/2016  . Ischemic cardiomyopathy   . Dental abscess 04/22/2015  . Dysphagia 04/22/2015  . Diabetes mellitus type 2, uncontrolled, without complications ([place]) 53/66/4403  . Sleep disturbance 08/08/2013  . Angina at rest [facility]) 04/12/2011  . CAD (coronary artery disease) 04/12/2011  . HTN (hypertension) 04/12/2011  . Hyperlipemia 04/12/2011    Past Surgical History:  Procedure Laterality Date  . CORONARY ANGIOPLASTY     x 5  . CORONARY STENT PLACEMENT     x 7  . LEFT HEART CATHETERIZATION WITH CORONARY ANGIOGRAM N/A 04/14/2011   Procedure: LEFT HEART CATHETERIZATION WITH CORONARY ANGIOGRAM;  Surgeon: Pixie Casino, MD;  Location: [facility] CATH LAB;  Service: Cardiovascular;  Laterality: N/A;  . LEFT HEART CATHETERIZATION WITH CORONARY ANGIOGRAM N/A 06/25/2012   Procedure: LEFT HEART CATHETERIZATION WITH CORONARY ANGIOGRAM;  Surgeon: Peter M Martinique, MD;  Location: [facility] CATH LAB;  Service: Cardiovascular;  Laterality: N/A;  . [employer]    Prior to Admission medications   Medication Sig Start Date End Date Taking?  Authorizing Provider  aspirin EC 81 MG tablet Take 1 tablet (81 mg total) by mouth daily. 10/02/16   Dunn, Areta Haber, PA-C  atorvastatin (LIPITOR) 80 MG tablet Take 1 tablet (80 mg total) by mouth daily. 08/21/17   Minna Merritts, MD  blood glucose meter kit and supplies KIT Dispense based on patient and insurance preference. Use once daily fasting. (FOR ICD 10 DIAGNOSIS OF E11.59). 05/15/16   [name], MD  carvedilol (COREG) 6.25 MG tablet Take 1 tablet (6.25 mg total) by mouth 2 (two) times daily with a meal. 10/02/16   Dunn, Areta Haber, PA-C   HYDROcodone-acetaminophen (NORCO/VICODIN) 5-325 MG tablet Take 1 tablet by mouth every 6 (six) hours as needed for up to 3 days for severe pain. 10/10/17 10/13/17  Victorino Dike, FNP  JANUVIA 100 MG tablet TAKE 1 TABLET DAILY 05/08/17   [name], MD  JARDIANCE 25 MG TABS tablet TAKE 1 TABLET DAILY 08/03/17   [name], MD  Lactobacillus (ACIDOPHILUS PROBIOTIC) 100 MG CAPS Take 1 capsule (100 mg total) by mouth daily. Patient taking differently: Take 100 mg by mouth daily as needed.  04/23/15   Bettey Costa, MD  metFORMIN (GLUCOPHAGE) 1000 MG tablet Take 1 tablet (1,000 mg total) by mouth 2 (two) times daily with a meal. 09/01/17   [name], MD  Multiple Vitamins-Minerals (MULTIVITAMINS THER. W/MINERALS) TABS Take 1 tablet by mouth every morning.     [provider]  niacin (NIASPAN) 1000 MG CR tablet TAKE 2 TABLETS (2000 MG TOTAL) AT BEDTIME 08/21/17   Gollan, Kathlene November, MD  nitroGLYCERIN (NITROLINGUAL) 0.4 MG/SPRAY spray Place 1 spray under the tongue every 5 (five) minutes as needed for chest pain. 11/17/14   Rubbie Battiest, RN  prasugrel (EFFIENT) 10 MG TABS tablet Take 1 tablet (10 mg total) by mouth daily. 06/19/16   Minna Merritts, MD  ramipril (ALTACE) 2.5 MG capsule TAKE 1 CAPSULE EVERY MORNING 04/29/17   Minna Merritts, MD  ranolazine (RANEXA) 1000 MG SR tablet Take 1 tablet (1,000 mg total) by mouth 2 (two) times daily. 05/20/16   Minna Merritts, MD  sildenafil (VIAGRA) 100 MG tablet Take 1 tablet (100 mg total) by mouth daily as needed for erectile dysfunction. 03/14/13   Minna Merritts, MD    Allergies Atarax [hydroxyzine hcl] and Latex  Family History  Problem Relation Age of Onset  . COPD Mother   . Other Father        muscle tumor  . Stroke Father   . Prostate cancer Father        mets  . Heart disease Father   . Alcoholism Father   . Stroke Sister   . Bladder Cancer Neg Hx   . Kidney cancer Neg Hx     Social History Social  History   Tobacco Use  . Smoking status: Former Smoker    Types: Cigarettes    Last attempt to quit: 09/14/2007    Years since quitting: 10.0  . Smokeless tobacco: Never Used  . Tobacco comment: quit 2009  Substance Use Topics  . Alcohol use: No    Alcohol/week: 0.0 oz    Comment: rarely 1 beer in last 5 years  . Drug use: No    Types: Marijuana    Comment: pt stopped fall 2012    Review of Systems Constitutional: Negative for fever. Cardiovascular: Negative for chest pain. Respiratory: Negative for shortness of breath. Musculoskeletal: Positive  for left shoulder, elbow, and wrist pain. Skin: Positive for abrasions in multiple stages over bilateral upper extremities.  Positive for new abrasion to the left olecranon area.  Positive for traumatic bursitis of the left elbow.  Positive for left wrist pain. Neurological: Negative for decrease in sensation  ____________________________________________   PHYSICAL EXAM:  VITAL SIGNS: ED Triage Vitals  Enc Vitals Group     BP 10/10/17 1050 (!) 154/85     Pulse Rate 10/10/17 1050 84     Resp 10/10/17 1050 20     Temp 10/10/17 1050 98 F (36.7 C)     Temp Source 10/10/17 1050 Oral     SpO2 10/10/17 1050 99 %     Weight 10/10/17 1051 196 lb (88.9 kg)     Height 10/10/17 1051 '5\' 8"'  (1.727 m)     Head Circumference --      Peak Flow --      Pain Score 10/10/17 1051 7     Pain Loc --      Pain Edu? --      Excl. in [place]? --     Constitutional: Alert and oriented. Well appearing and in no acute distress. Eyes: Conjunctivae are clear without discharge or drainage Head: Atraumatic Neck: Supple Respiratory: No cough. Respirations are even and unlabored. Musculoskeletal: Swelling diffuse over the left wrist without deformity.  Range of motion of the left wrist is guarded secondary to pain.  Left elbow without obvious deformity.  Patient is able to demonstrate full extension and flexion.  Left shoulder appears to have a slight  step-off.  Patient is able to abduct but only about 30 degrees. Neurologic: Motor and sensation of the left upper extremity is intact Skin: Abrasion to the left elbow is noted. Psychiatric: Affect and behavior are appropriate.  ____________________________________________   LABS (all labs ordered are listed, but only abnormal results are displayed)  Labs Reviewed - No data to display ____________________________________________  RADIOLOGY  Left shoulder and left wrist both negative for acute bony abnormality per radiology. ____________________________________________   PROCEDURES  Procedures  ____________________________________________   INITIAL IMPRESSION / ASSESSMENT AND PLAN / ED COURSE  Ruslan Mccabe is a 67 y.o. who presents to the emergency department for treatment after a mechanical, non-syncopal fall prior to arrival.  Symptoms and exam are most consistent with a rotator cuff injury, traumatic bursitis of the left elbow, and left wrist sprain.  Sling was applied by the RN prior to discharge.  He will be given a prescription for Norco to be taken sparingly over the next few days.  He plans to call and schedule an appointment with his orthopedist in [place].  He was advised to return to the emergency department for symptoms that change or worsen or for new concerns if he is unable to see his primary care provider or the specialist.   Medications  HYDROcodone-acetaminophen (NORCO/VICODIN) 5-325 MG per tablet 1 tablet (1 tablet Oral Provided for home use 10/10/17 1401)  bacitracin ointment 1 application (1 application Topical Given 10/10/17 1402)    Pertinent labs & imaging results that were available during my care of the patient were reviewed by me and considered in my medical decision making (see chart for details).  _________________________________________   FINAL CLINICAL IMPRESSION(S) / ED DIAGNOSES  Final diagnoses:  Sprain of left rotator cuff capsule,  initial encounter  Wrist sprain, left, initial encounter  Traumatic bursitis  Abrasion    ED Discharge Orders  Ordered    HYDROcodone-acetaminophen (NORCO/VICODIN) 5-325 MG tablet  Every 6 hours PRN     10/10/17 1403       If controlled substance prescribed during this visit, 12 month history viewed on the [place] prior to issuing an initial prescription for Schedule II or III opiod.    Victorino Dike, FNP 10/10/17 1424    Arta Silence, MD 10/10/17 1546

## 2017-10-10 NOTE — ED Triage Notes (Signed)
L shoulder pain since fell out of back of truck approx 0815 this am.

## 2017-10-10 NOTE — Discharge Instructions (Signed)
Apply antibiotic ointment to the left elbow 2 times per day. Follow up with orthopedics. Return to the ER for symptoms that change or worsen or for new concerns.

## 2017-10-15 ENCOUNTER — Telehealth: Payer: Self-pay

## 2017-10-15 DIAGNOSIS — M25512 Pain in left shoulder: Secondary | ICD-10-CM

## 2017-10-15 NOTE — Telephone Encounter (Signed)
Referral placed though the patient is already established with Dr. Paulla Fore and can likely just call their office to get set up with an appointment.

## 2017-10-15 NOTE — Addendum Note (Signed)
Addended by: [name] on: 10/15/2017 07:46 PM   Modules accepted: Orders

## 2017-10-15 NOTE — Telephone Encounter (Signed)
Copied from [place] [id number]. Topic: Referral - Request >> Oct 15, 2017 10:14 AM Synthia Innocent wrote: Reason for CRM: Was at [facility] ER on 10/10/17, requesting referral to Dr Paulla Fore for left shoulder/back injury, rotator cuff tear?

## 2017-10-15 NOTE — Telephone Encounter (Signed)
Please advise 

## 2017-10-27 ENCOUNTER — Ambulatory Visit: Payer: Self-pay

## 2017-10-27 ENCOUNTER — Ambulatory Visit (INDEPENDENT_AMBULATORY_CARE_PROVIDER_SITE_OTHER): Payer: Medicare Other | Admitting: Sports Medicine

## 2017-10-27 ENCOUNTER — Encounter: Payer: Self-pay | Admitting: Sports Medicine

## 2017-10-27 VITALS — BP 120/78 | HR 100 | Ht 68.0 in | Wt 198.0 lb

## 2017-10-27 DIAGNOSIS — I25119 Atherosclerotic heart disease of native coronary artery with unspecified angina pectoris: Secondary | ICD-10-CM | POA: Diagnosis not present

## 2017-10-27 DIAGNOSIS — M25522 Pain in left elbow: Secondary | ICD-10-CM

## 2017-10-27 DIAGNOSIS — M75102 Unspecified rotator cuff tear or rupture of left shoulder, not specified as traumatic: Secondary | ICD-10-CM | POA: Diagnosis not present

## 2017-10-27 DIAGNOSIS — M75101 Unspecified rotator cuff tear or rupture of right shoulder, not specified as traumatic: Secondary | ICD-10-CM

## 2017-10-27 DIAGNOSIS — G2589 Other specified extrapyramidal and movement disorders: Secondary | ICD-10-CM | POA: Diagnosis not present

## 2017-10-27 DIAGNOSIS — M7022 Olecranon bursitis, left elbow: Secondary | ICD-10-CM | POA: Diagnosis not present

## 2017-10-27 MED ORDER — NITROGLYCERIN 0.2 MG/HR TD PT24: MEDICATED_PATCH | TRANSDERMAL | 1 refills | Status: DC

## 2017-10-27 NOTE — Progress Notes (Signed)
LIMITED MSK ULTRASOUND OF Left shoulder Images were obtained and interpreted by myself, Teresa Coombs, DO  Images have been saved and stored to PACS system. Images obtained on: GE S7 Ultrasound machine  FINDINGS:  Biceps Tendon: Normal Pec Major Insertion: Normal Subscapularis Tendon: Slight thickening with possible interstitial tearing. Supraspinatus Tendon: Thickening with interstitial splints without appreciable full-thickness tear.  Small amount of subacromial bursal formation this is minimal. Infraspinatus/Teres Minor Tendon: Normal AC Joint: Positive mushroom sign, negative signal palpation JOINT: No significant GH spurring appreciated  LABRUM: Not evaluated   IMPRESSION:  1. Supraspinatus tendinopathy

## 2017-10-27 NOTE — Procedures (Signed)
PROCEDURE NOTE:  Ultrasound Guided: Aspiration and Injection: Left elbow Images were obtained and interpreted by myself, Teresa Coombs, DO  Images have been saved and stored to PACS system. Images obtained on: GE S7 Ultrasound machine    ULTRASOUND FINDINGS:  Recurrent olecranon bursa formation bursa  DESCRIPTION OF PROCEDURE:  The patient's clinical condition is marked by substantial pain and/or significant functional disability. Other conservative therapy has not provided relief, is contraindicated, or not appropriate. There is a reasonable likelihood that injection will significantly improve the patient's pain and/or functional impairment.   After discussing the risks, benefits and expected outcomes of the injection and all questions were reviewed and answered, the patient wished to undergo the above named procedure.  Verbal consent was obtained.  The ultrasound was used to identify the target structure and adjacent neurovascular structures. The skin was then prepped in sterile fashion and the target structure was injected under direct visualization using sterile technique as below:  Single injection performed as below: PREP: Alcohol, Ethel Chloride and 3 cc 1% lidocaine on 25g 1.5 in. needle APPROACH:direct, stopcock technique, 18g 1.5 in. INJECTATE: 0.5 cc 0.5% Marcaine and 0.5 cc 80mg /mL DepoMedrol ASPIRATE: 10cc bloody serous aspirate DRESSING: Band-Aid and Patients home compression device  Post procedural instructions including recommending icing and warning signs for infection were reviewed.    This procedure was well tolerated and there were no complications.   IMPRESSION: Succesful Ultrasound Guided: Aspiration and Injection

## 2017-10-27 NOTE — Patient Instructions (Addendum)
You had an injection today.  Things to be aware of after injection are listed below: . You may experience no significant improvement or even a slight worsening in your symptoms during the first 24 to 48 hours.  After that we expect your symptoms to improve gradually over the next 2 weeks for the medicine to have its maximal effect.  You should continue to have improvement out to 6 weeks after your injection. . Dr. Paulla Fore recommends icing the site of the injection for 20 minutes  1-2 times the day of your injection . You may shower but no swimming, tub bath or Jacuzzi for 24 hours. . If your bandage falls off this does not need to be replaced.  It is appropriate to remove the bandage after 4 hours. . You may resume light activities as tolerated unless otherwise directed per Dr. Paulla Fore during your visit  POSSIBLE STEROID SIDE EFFECTS:  Side effects from injectable steroids tend to be less than when taken orally however you may experience some of the symptoms listed below.  If experienced these should only last for a short period of time. Change in menstrual flow  Edema (swelling)  Increased appetite Skin flushing (redness)  Skin rash/acne  Thrush (oral) Yeast vaginitis    Increased sweating  Depression Increased blood glucose levels Cramping and leg/calf  Euphoria (feeling happy)  POSSIBLE PROCEDURE SIDE EFFECTS: The side effects of the injection are usually fairly minimal however if you may experience some of the following side effects that are usually self-limited and will is off on their own.  If you are concerned please feel free to call the office with questions:  Increased numbness or tingling  Nausea or vomiting  Swelling or bruising at the injection site   Please call our office if if you experience any of the following symptoms over the next week as these can be signs of infection:   Fever greater than 100.37F  Significant swelling at the injection site  Significant redness or drainage  from the injection site  If after 2 weeks you are continuing to have worsening symptoms please call our office to discuss what the next appropriate actions should be including the potential for a return office visit or other diagnostic testing.   Please perform the exercise program that we have prepared for you and gone over in detail on a daily basis.  In addition to the handout you were provided you can access your program through: [email or web address]   Your unique program code is:  [email or web address]    Nitroglycerin Protocol   Apply 1/4 nitroglycerin patch to affected area daily.  Change position of patch within the affected area every 24 hours.  You may experience a headache during the first 1-2 weeks of using the patch, these should subside.  If you experience headaches after beginning nitroglycerin patch treatment, you may take your preferred over the counter pain reliever.  Another side effect of the nitroglycerin patch is skin irritation or rash related to patch adhesive.  Please notify our office if you develop more severe headaches or rash, and stop the patch.  Tendon healing with nitroglycerin patch may require 12 to 24 weeks depending on the extent of injury.  Men should not use if taking Viagra, Cialis, or Levitra.   Do not use if you have migraines or rosacea.

## 2017-10-27 NOTE — Progress Notes (Signed)
PROCEDURE NOTE: THERAPEUTIC EXERCISES ([id number]) 15 minutes spent for Therapeutic exercises as below and as referenced in the AVS.  This included exercises focusing on stretching, strengthening, with significant focus on eccentric aspects.   Proper technique shown and discussed handout in great detail with ATC.  All questions were discussed and answered.   Long term goals include an improvement in range of motion, strength, endurance as well as avoiding reinjury. Frequency of visits is one time as determined during today's  office visit. Frequency of exercises to be performed is as per handout.  EXERCISES REVIEWED: Intrinsic Rotator Cuff Exercises Scapular Stabilization 

## 2017-10-27 NOTE — Progress Notes (Signed)
[name]. [name], [place] [facility] [place]  [name] - 67 y.o. male MRN [id number]  Date of birth: 25-Feb-1951  Visit Date: 10/27/2017  PCP: [name], MD   Referred by: [name], MD  Scribe(s) for today's visit: [name], LAT, ATC  SUBJECTIVE:  [name] is here for Follow-up (L elbow and L shoulder pain) .    Notes from OV on 06/24/17: Compared to the last office visit, his previously described L elbow pain symptoms are worsening, he c/o increased swelling.  Current symptoms are moderate & are nonradiating He has been wearing compression sleeve with some relief from swelling. He had steroid injection and asp in the past and tolerated well.   10/27/17: Compared to the last office visit, his previously described L elbow symptoms are worsening w/ increased L swelling noted.  Began about a week ago when he was moving a table from the bed of his truck when he fell. Current symptoms are mild & are nonradiating He has been wearing his compression sleeve.   His L shoulder pain symptoms INITIALLY: Began on 10/10/17 when he slipped and fell while trying to move a table out of the bed of his truck Described as 2/10 at rest and a 4/10 aching pain at it's worst , radiating to L upper arm Worsened with lifting and L shoulder aBd and ER Improved with rest Additional associated symptoms include: no N/T into the L UE; notes mechanical symptoms in the L shoulder    At this time symptoms are improving compared to onset He had been taking Hydrocodone that was prescribed in the ED but he has stopped taking those.  Went to [facility] and dx w/ L RC injury.  Was in a sling for a week which he stopped using this past Saturday (10/24/17)   REVIEW OF SYSTEMS: Reports night time disturbances. Reports fevers, chills, or night sweats.  Yes to night sweats. Denies unexplained weight loss. Denies  personal history of cancer. Denies changes in bowel or bladder habits. Reports recent unreported falls.  Yes in early June 2019. Denies new or worsening dyspnea or wheezing. Denies headaches or dizziness.  Denies numbness, tingling or weakness  In the extremities.  Denies dizziness or presyncopal episodes Denies lower extremity edema    HISTORY & PERTINENT PRIOR DATA:  Prior History reviewed and updated per electronic medical record.  Significant/pertinent history, findings, studies include:  reports that he quit smoking about 10 years ago. His smoking use included cigarettes. He has never used smokeless tobacco. No results for input(s): HGBA1C, LABURIC, CREATINE in the last 8760 hours. No specialty comments available. No problems updated.  OBJECTIVE:  VS:  HT:5\' 8"  (172.7 cm)   WT:198 lb (89.8 kg)  BMI:30.11    BP:120/78  HR:100bpm  TEMP: ( )  RESP:96 %   PHYSICAL EXAM: CONSTITUTIONAL: Well-developed, Well-nourished and In no acute distress Alert & appropriately interactive. and Not depressed or anxious appearing. Respiratory: No increased work of breathing.  Trachea Midline EYES: Pupils are equal., EOM intact without nystagmus. and No scleral icterus.  Upper extremities: Warm and well perfused Edema: Joint associated swelling as per Orthopedic exam NEURO: unremarkable, Normal associated myotomal distribution strength to manual muscle testing, Normal sensation to light touch  MSK Exam: Left shoulder: . Well aligned, no significant deformity. . No overlying skin changes. . TTP over Anterior shoulder diffusely without bony tenderness . Good internal and external rotation  strength but slight weakness with empty can testing.  Range of motion is limited overhead by 20 degrees compared to contralateral side. . No significant crepitation with axial load and circumduction.  He has some pain with Hawkins and Neer's but this is minimal.   PROCEDURES & DATA REVIEWED:  Per  procedure note  ASSESSMENT   1. Left elbow pain   2. Rotator cuff syndrome of left shoulder   3. Scapular dyskinesis   4. Rotator cuff syndrome of right shoulder   5. Olecranon bursitis of left elbow     PLAN:  Recurrent olecranon bursitis aspirated and injected again today.  Continue with compression for this and frequent icing.  He does have subacromial bursitis as well as scapular dyskinesis.  Discussed the foundation of treatment for this condition is physical therapy and/or daily (5-6 days/week) therapeutic exercises, focusing on core strengthening, coordination, neuromuscular control/reeducation.  Therapeutic exercises prescribed per procedure note.  Discussed options with the patient today including biologic treatment with topical nitroglycerin. Patient has no contraindications & understands the risks, benefits and intentions of treatment. Emphasized the importance of rotating sites as well as appropriate and expected adverse reactions including orthostasis, headache, adhesive sensitivity.  Begin with 1/4 patch to the affected area. Okay to titrate to half a patch as tolerated.   Follow-up: Return in about 6 weeks (around 12/08/2017).      Please see additional documentation for Objective, Assessment and Plan sections. Pertinent additional documentation may be included in corresponding procedure notes, imaging studies, problem based documentation and patient instructions. Please see these sections of the encounter for additional information regarding this visit.  CMA/ATC served as Education administrator during this visit. History, Physical, and Plan performed by medical provider. Documentation and orders reviewed and attested to.      [name], [name] Physician

## 2017-10-31 ENCOUNTER — Other Ambulatory Visit: Payer: Self-pay | Admitting: Family Medicine

## 2017-11-13 ENCOUNTER — Telehealth: Payer: Self-pay | Admitting: Family Medicine

## 2017-11-13 NOTE — Telephone Encounter (Signed)
Attempted to call patient to schedule AWV. Patient did not answer. Will try to call patient again at a later time. SF °

## 2017-12-08 ENCOUNTER — Ambulatory Visit (INDEPENDENT_AMBULATORY_CARE_PROVIDER_SITE_OTHER): Payer: Medicare Other

## 2017-12-08 ENCOUNTER — Ambulatory Visit (INDEPENDENT_AMBULATORY_CARE_PROVIDER_SITE_OTHER): Payer: Medicare Other | Admitting: Sports Medicine

## 2017-12-08 ENCOUNTER — Encounter: Payer: Self-pay | Admitting: Sports Medicine

## 2017-12-08 ENCOUNTER — Ambulatory Visit: Payer: Medicare Other | Admitting: Sports Medicine

## 2017-12-08 VITALS — BP 130/76 | HR 88 | Ht 68.0 in | Wt 195.2 lb

## 2017-12-08 DIAGNOSIS — M1712 Unilateral primary osteoarthritis, left knee: Secondary | ICD-10-CM | POA: Diagnosis not present

## 2017-12-08 DIAGNOSIS — M75102 Unspecified rotator cuff tear or rupture of left shoulder, not specified as traumatic: Secondary | ICD-10-CM | POA: Diagnosis not present

## 2017-12-08 DIAGNOSIS — I25119 Atherosclerotic heart disease of native coronary artery with unspecified angina pectoris: Secondary | ICD-10-CM

## 2017-12-08 DIAGNOSIS — G8929 Other chronic pain: Secondary | ICD-10-CM

## 2017-12-08 DIAGNOSIS — M7022 Olecranon bursitis, left elbow: Secondary | ICD-10-CM

## 2017-12-08 DIAGNOSIS — G2589 Other specified extrapyramidal and movement disorders: Secondary | ICD-10-CM | POA: Diagnosis not present

## 2017-12-08 DIAGNOSIS — M25562 Pain in left knee: Secondary | ICD-10-CM

## 2017-12-08 DIAGNOSIS — M75101 Unspecified rotator cuff tear or rupture of right shoulder, not specified as traumatic: Secondary | ICD-10-CM

## 2017-12-08 MED ORDER — NITROGLYCERIN 0.2 MG/HR TD PT24: MEDICATED_PATCH | TRANSDERMAL | 1 refills | Status: DC

## 2017-12-08 MED ORDER — DICLOFENAC SODIUM 1 % TD GEL: TRANSDERMAL | 1 refills | Status: DC

## 2017-12-08 NOTE — Progress Notes (Signed)
[name]. [name], [place] at [facility] [phone]  [name] - 67 y.o. male MRN [id number]  Date of birth: 10/20/1950  Visit Date: 12/08/2017  PCP: [name], MD   Referred by: [name], MD  Scribe(s) for today's visit: Wendy Poet, LAT, ATC  SUBJECTIVE:  [name] is here for Follow-up (L elbow and B shld pain f/u) and Initial Assessment (Left knee pain) .    Notes from OV on 06/24/17: Compared to the last office visit, his previously described L elbow pain symptoms are worsening, he c/o increased swelling.  Current symptoms are moderate & are nonradiating He has been wearing compression sleeve with some relief from swelling. He had steroid injection and asp in the past and tolerated well.   10/27/17: Compared to the last office visit, his previously described L elbow symptoms are worsening w/ increased L swelling noted.  Began about a week ago when he was moving a table from the bed of his truck when he fell. Current symptoms are mild & are nonradiating He has been wearing his compression sleeve.   His L shoulder pain symptoms INITIALLY: Began on 10/10/17 when he slipped and fell while trying to move a table out of the bed of his truck Described as 2/10 at rest and a 4/10 aching pain at it's worst , radiating to L upper arm Worsened with lifting and L shoulder aBd and ER Improved with rest Additional associated symptoms include: no N/T into the L UE; notes mechanical symptoms in the L shoulder    At this time symptoms are improving compared to onset He had been taking Hydrocodone that was prescribed in the ED but he has stopped taking those.  Went to [facility] and dx w/ L RC injury.  Was in a sling for a week which he stopped using this past Saturday (10/24/17)  12/08/2017:  L shoulder - Compared to the last office visit, his previously described symptoms show no change.  He  states that the area on the superior L shoulder has improved but is now having more pain along the deltoid. Current symptoms are moderate & are radiating to L lateral deltoid. He has not picked up his nitro patches due to getting very busy w/ moving.  He has also not tried his HEP also due to time constraints.  Pt states that he has done a lot of moving since his last visit and feels like both of his shoulders are bothering him more.  L knee -  Compared to the last office visit, his previously described symptoms are worsening w/ increased pain w/ climbing stairs. Current symptoms are 4-5/10 & are nonradiating He has been wearing his knee compression sleeve.   L elbow has improved since his last visit and has no current c/o regarding the L elbow.  He con't to wear his elbow sleeve.   REVIEW OF SYSTEMS: Reports night time disturbances. Reports fevers, chills, or night sweats.  Yes to night sweats. Denies unexplained weight loss. Denies personal history of cancer. Denies changes in bowel or bladder habits. Denies recent unreported falls. Denies new or worsening dyspnea or wheezing. Denies headaches or dizziness.  Denies numbness, tingling or weakness  In the extremities.  Denies dizziness or presyncopal episodes Denies lower extremity edema    HISTORY & PERTINENT PRIOR DATA:  Prior History reviewed and updated per electronic medical record.  Significant/pertinent history, findings, studies include:  reports that  he quit smoking about 10 years ago. His smoking use included cigarettes. He has never used smokeless tobacco. No results for input(s): HGBA1C, LABURIC, CREATINE in the last 8760 hours. No specialty comments available. No problems updated.  OBJECTIVE:  VS:  HT:5\' 8"  (172.7 cm)   WT:195 lb 3.2 oz (88.5 kg)  BMI:29.69    BP:130/76  HR:88bpm  TEMP: ( )  RESP:95 %   PHYSICAL EXAM: Constitutional: WDWN, Non-toxic appearing. Psychiatric: Alert & appropriately interactive.   Not depressed or anxious appearing. Respiratory: No increased work of breathing.  Trachea Midline Eyes: Pupils are equal.  EOM intact without nystagmus.  No scleral icterus  Vascular Exam: warm to touch no edema  upper and lower extremity neuro exam: unremarkable  MSK Exam: Improved range of motion with his shoulder.  He does have some weakness with external rotation but this is minimal.  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  Left knee has a small amount of generalized synovitis but this is minimal.  Ligamentously stable.  Small amount of pain with patellar grind.   PROCEDURES & DATA REVIEWED:  Ultrasound-guided injection of the knee performed procedure note today.  X-rays reviewed in person with him today of the left knee which show only mild degenerative changes.  ASSESSMENT & PLAN:   1. Chronic pain of left knee   2. Rotator cuff syndrome of left shoulder   3. Scapular dyskinesis   4. Rotator cuff syndrome of right shoulder   5. Olecranon bursitis of left elbow     PLAN: Resume nitroglycerin protocol for the shoulder but he did not pick this up previously.  Injection today for the knee  Continue with compression for the elbow which is improved and doing overall significantly better.   Follow-up: Return in about 8 weeks (around 02/02/2018) for repeat clinical exam, call if your knee isn't better for an MRI.      Please see additional documentation for Objective, Assessment and Plan sections. Pertinent additional documentation may be included in corresponding procedure notes, imaging studies, problem based documentation and patient instructions. Please see these sections of the encounter for additional information regarding this visit.  CMA/ATC served as Education administrator during this visit. History, Physical, and Plan performed by medical provider. Documentation and orders reviewed and attested to.       Gerda Diss, [place] [facility] Physician

## 2017-12-08 NOTE — Progress Notes (Addendum)
PROCEDURE NOTE:  Ultrasound Guided: Injection: Left knee Images were obtained and interpreted by myself, Teresa Coombs, DO  Images have been saved and stored to PACS system. Images obtained on: GE S7 Ultrasound machine    ULTRASOUND FINDINGS:  Mild degenerative spurring with moderate degree of meniscal bulging.  Trace effusion.  DESCRIPTION OF PROCEDURE:  The patient's clinical condition is marked by substantial pain and/or significant functional disability. Other conservative therapy has not provided relief, is contraindicated, or not appropriate. There is a reasonable likelihood that injection will significantly improve the patient's pain and/or functional impairment.   After discussing the risks, benefits and expected outcomes of the injection and all questions were reviewed and answered, the patient wished to undergo the above named procedure.  Verbal consent was obtained.  The ultrasound was used to identify the target structure and adjacent neurovascular structures. The skin was then prepped in sterile fashion and the target structure was injected under direct visualization using sterile technique as below:  Single injection performed as below: PREP: Alcohol and Ethel Chloride APPROACH:superiolateral, single injection, 25g 1.5 in. INJECTATE: 2 cc 0.5% Marcaine and 2 cc 40mg /mL DepoMedrol ASPIRATE: None DRESSING: Band-Aid  Post procedural instructions including recommending icing and warning signs for infection were reviewed.    This procedure was well tolerated and there were no complications.   IMPRESSION: Succesful Ultrasound Guided: Injection

## 2017-12-09 ENCOUNTER — Telehealth: Payer: Self-pay | Admitting: Family Medicine

## 2017-12-09 MED ORDER — NITROGLYCERIN 0.2 MG/HR TD PT24: MEDICATED_PATCH | TRANSDERMAL | 1 refills | Status: DC

## 2017-12-09 NOTE — Telephone Encounter (Signed)
Medication resent to requested pharmacy.  

## 2017-12-09 NOTE — Telephone Encounter (Signed)
Copied from [place] [id number]. Topic: Quick Communication - Rx Refill/Question >> Dec 09, 2017  1:06 PM Lysle Morales L, NT wrote: Medication: nitroGLYCERIN (NITRODUR - DOSED IN MG/24 HR) 0.2 mg/hr patch  nitroGLYCERIN (NITROLINGUAL) 0.4 MG/SPRAY spray  The patient needs the above medications sent to the below pharmacy [facility] due to cost  Has the patient contacted their pharmacy? yes  (Agent: If no, request that the patient contact the pharmacy for the refill. (Agent: If yes, when and what did the pharmacy advise?  Preferred Pharmacy (with phone number or street name [facility] ([address] [place] [phone] (Phone) [phone] (Fax)      Agent: Please be advised that RX refills may take up to 3 business days. We ask that you follow-up with your pharmacy.

## 2017-12-14 ENCOUNTER — Other Ambulatory Visit: Payer: Self-pay

## 2017-12-14 ENCOUNTER — Encounter: Payer: Self-pay | Admitting: Sports Medicine

## 2017-12-14 NOTE — Procedures (Signed)
PROCEDURE NOTE:  Ultrasound Guided: Injection: Left knee Images were obtained and interpreted by myself, Teresa Coombs, DO  Images have been saved and stored to PACS system. Images obtained on: GE S7 Ultrasound machine    ULTRASOUND FINDINGS:  Mild degenerative spurring with moderate degree of meniscal bulging.  Trace effusion.  DESCRIPTION OF PROCEDURE:  The patient's clinical condition is marked by substantial pain and/or significant functional disability. Other conservative therapy has not provided relief, is contraindicated, or not appropriate. There is a reasonable likelihood that injection will significantly improve the patient's pain and/or functional impairment.   After discussing the risks, benefits and expected outcomes of the injection and all questions were reviewed and answered, the patient wished to undergo the above named procedure.  Verbal consent was obtained.  The ultrasound was used to identify the target structure and adjacent neurovascular structures. The skin was then prepped in sterile fashion and the target structure was injected under direct visualization using sterile technique as below:  Single injection performed as below: PREP: Alcohol and Ethel Chloride APPROACH:superiolateral, single injection, 25g 1.5 in. INJECTATE: 2 cc 0.5% Marcaine and 2 cc 40mg /mL DepoMedrol ASPIRATE: None DRESSING: Band-Aid  Post procedural instructions including recommending icing and warning signs for infection were reviewed.    This procedure was well tolerated and there were no complications.   IMPRESSION: Succesful Ultrasound Guided: Injection

## 2017-12-27 ENCOUNTER — Encounter: Payer: Self-pay | Admitting: Sports Medicine

## 2018-01-01 ENCOUNTER — Other Ambulatory Visit: Payer: Self-pay | Admitting: Physician Assistant

## 2018-01-04 ENCOUNTER — Other Ambulatory Visit: Payer: Self-pay

## 2018-01-04 MED ORDER — METFORMIN HCL 1000 MG PO TABS: 1000.0000 mg | ORAL_TABLET | Freq: Two times a day (BID) | ORAL | 3 refills | Status: DC

## 2018-01-21 ENCOUNTER — Other Ambulatory Visit: Payer: Self-pay | Admitting: Cardiovascular Disease

## 2018-01-26 ENCOUNTER — Ambulatory Visit (INDEPENDENT_AMBULATORY_CARE_PROVIDER_SITE_OTHER): Payer: Medicare Other | Admitting: Family Medicine

## 2018-01-26 ENCOUNTER — Encounter: Payer: Self-pay | Admitting: Family Medicine

## 2018-01-26 VITALS — BP 108/70 | HR 82 | Temp 98.7°F | Ht 68.0 in | Wt 196.0 lb

## 2018-01-26 DIAGNOSIS — I10 Essential (primary) hypertension: Secondary | ICD-10-CM

## 2018-01-26 DIAGNOSIS — I25119 Atherosclerotic heart disease of native coronary artery with unspecified angina pectoris: Secondary | ICD-10-CM

## 2018-01-26 DIAGNOSIS — E1165 Type 2 diabetes mellitus with hyperglycemia: Secondary | ICD-10-CM | POA: Diagnosis not present

## 2018-01-26 DIAGNOSIS — D3500 Benign neoplasm of unspecified adrenal gland: Secondary | ICD-10-CM | POA: Diagnosis not present

## 2018-01-26 DIAGNOSIS — Z23 Encounter for immunization: Secondary | ICD-10-CM

## 2018-01-26 DIAGNOSIS — L600 Ingrowing nail: Secondary | ICD-10-CM | POA: Diagnosis not present

## 2018-01-26 DIAGNOSIS — IMO0001 Reserved for inherently not codable concepts without codable children: Secondary | ICD-10-CM

## 2018-01-26 MED ORDER — DEXAMETHASONE 1 MG PO TABS: 1.0000 mg | ORAL_TABLET | Freq: Once | ORAL | 0 refills | Status: AC

## 2018-01-26 NOTE — Progress Notes (Signed)
[name], MD Phone: [phone]  [name] is a 67 y.o. male who presents today for f/u.  CC: adrenal adenoma, diabetes, HTN  DIABETES Disease Monitoring: Blood Sugar ranges-not checkign Polyuria/phagia/dipsia- no       Medications: Compliance- taking jardiance, januvia, metformin Hypoglycemic symptoms- only if he does not eat  HYPERTENSION  Disease Monitoring  Home BP Monitoring not checking Chest pain- no    Dyspnea- no Medications  Compliance-  Taking coreg and remipril.   Edema- no  Adrenal adenoma: Patient had a follow-up CT scan that revealed stability of this.  No further imaging is needed.  He has not completed the lab work for this yet.  We will get this completed today.  Toenail issue: Patient thinks he might have an ingrown toenail on his left foot.  It hurts mildly.  No drainage.  Social History   Tobacco Use  Smoking Status Former Smoker  . Types: Cigarettes  . Last attempt to quit: 09/14/2007  . Years since quitting: 10.3  Smokeless Tobacco Never Used  Tobacco Comment   quit 2009     ROS see history of present illness  Objective  Physical Exam Vitals:   01/26/18 0804  BP: 108/70  Pulse: 82  Temp: 98.7 F (37.1 C)  SpO2: 97%    BP Readings from Last 3 Encounters:  01/26/18 108/70  12/08/17 130/76  10/27/17 120/78   Wt Readings from Last 3 Encounters:  01/26/18 196 lb (88.9 kg)  12/08/17 195 lb 3.2 oz (88.5 kg)  10/27/17 198 lb (89.8 kg)    Physical Exam  Constitutional: No distress.  Cardiovascular: Normal rate, regular rhythm and normal heart sounds.  Pulmonary/Chest: Effort normal and breath sounds normal.  Musculoskeletal: He exhibits no edema.  Neurological: He is alert.  Skin: Skin is warm and dry. He is not diaphoretic.   Diabetic Foot Exam - Simple   Simple Foot Form Diabetic Foot exam was performed with the following findings:  Yes 01/26/2018  8:28 AM  Visual Inspection See comments:  Yes Sensation  Testing Intact to touch and monofilament testing bilaterally:  Yes Pulse Check Posterior Tibialis and Dorsalis pulse intact bilaterally:  Yes Comments Scar on the bottom of left foot, small bruise on third toe second foot, possible ingrown toenail along the tibial aspect left foot, no overlying erythema, minimal tenderness, no swelling, no drainage, no other deformities, ulcerations, or skin breakdown     Assessment/Plan: Please see individual problem list.  Ingrown toenail Refer to podiatry.  HTN (hypertension) Well-controlled.  Continue current regimen.  Return for labs.  Adrenal adenoma Patient will return for lab work.  No further imaging needed.  Diabetes mellitus type 2, uncontrolled, without complications ([place]) Return for labs.  Continue current regimen.  He will see his ophthalmologist for yearly eye exam.  We will request pneumonia vaccine records.   Health Maintenance: We will request vaccine records and colonoscopy report.  He reports colonoscopy last year.  Orders Placed This Encounter  Procedures  . Flu vaccine HIGH DOSE PF (Fluzone High dose)  . HgB A1c    Standing Status:   Future    Standing Expiration Date:   01/27/2019  . Comp Met (CMET)    Standing Status:   Future    Standing Expiration Date:   01/27/2019  . Lipid panel    Standing Status:   Future    Standing Expiration Date:   01/27/2019  . Cortisol    Standing Status:   Future  Standing Expiration Date:   01/27/2019  . Aldosterone + renin activity w/ ratio    Standing Status:   Future    Standing Expiration Date:   01/27/2019  . Catecholamines, fractionated, Urine, 24 hour    Standing Status:   Future    Standing Expiration Date:   01/27/2019  . Metanephrines, Urine, 24 hour    Standing Status:   Future    Standing Expiration Date:   01/27/2019  . Ambulatory referral to Podiatry    Referral Priority:   Routine    Referral Type:   Consultation    Referral Reason:   Specialty Services Required     Requested Specialty:   Podiatry    Number of Visits Requested:   1    Meds ordered this encounter  Medications  . dexamethasone (DECADRON) 1 MG tablet    Sig: Take 1 tablet (1 mg total) by mouth once for 1 dose. Take at 11 pm the night before labs    Dispense:  1 tablet    Refill:  0     [name], MD [place]

## 2018-01-26 NOTE — Patient Instructions (Addendum)
Nice to see you. We will have you come back to complete the lab work. We will get you to see the podiatrist as well.

## 2018-01-26 NOTE — Assessment & Plan Note (Signed)
Return for labs.  Continue current regimen.  He will see his ophthalmologist for yearly eye exam.  We will request pneumonia vaccine records.

## 2018-01-26 NOTE — Assessment & Plan Note (Signed)
Refer to podiatry

## 2018-01-26 NOTE — Assessment & Plan Note (Signed)
Patient will return for lab work.  No further imaging needed.

## 2018-01-26 NOTE — Assessment & Plan Note (Signed)
Well-controlled.  Continue current regimen.  Return for labs.

## 2018-01-30 ENCOUNTER — Other Ambulatory Visit: Payer: Self-pay | Admitting: Family Medicine

## 2018-02-01 NOTE — Telephone Encounter (Signed)
Last OV 01/26/2018   Last refilled  11/02/2017 disp 90 with no refills   Sent to PCP for approval

## 2018-02-02 ENCOUNTER — Ambulatory Visit (INDEPENDENT_AMBULATORY_CARE_PROVIDER_SITE_OTHER): Payer: Medicare Other | Admitting: Sports Medicine

## 2018-02-02 ENCOUNTER — Other Ambulatory Visit: Payer: Medicare Other

## 2018-02-02 ENCOUNTER — Encounter: Payer: Self-pay | Admitting: Sports Medicine

## 2018-02-02 ENCOUNTER — Other Ambulatory Visit: Payer: Self-pay | Admitting: Family Medicine

## 2018-02-02 ENCOUNTER — Ambulatory Visit: Payer: Medicare Other | Admitting: Sports Medicine

## 2018-02-02 VITALS — BP 130/76 | HR 88 | Ht 68.0 in | Wt 201.0 lb

## 2018-02-02 DIAGNOSIS — M75101 Unspecified rotator cuff tear or rupture of right shoulder, not specified as traumatic: Secondary | ICD-10-CM | POA: Diagnosis not present

## 2018-02-02 DIAGNOSIS — G2589 Other specified extrapyramidal and movement disorders: Secondary | ICD-10-CM | POA: Diagnosis not present

## 2018-02-02 DIAGNOSIS — M75102 Unspecified rotator cuff tear or rupture of left shoulder, not specified as traumatic: Secondary | ICD-10-CM | POA: Diagnosis not present

## 2018-02-02 DIAGNOSIS — I25119 Atherosclerotic heart disease of native coronary artery with unspecified angina pectoris: Secondary | ICD-10-CM

## 2018-02-02 DIAGNOSIS — G8929 Other chronic pain: Secondary | ICD-10-CM | POA: Diagnosis not present

## 2018-02-02 DIAGNOSIS — M1712 Unilateral primary osteoarthritis, left knee: Secondary | ICD-10-CM | POA: Diagnosis not present

## 2018-02-02 DIAGNOSIS — M25562 Pain in left knee: Secondary | ICD-10-CM | POA: Diagnosis not present

## 2018-02-02 MED ORDER — DICLOFENAC SODIUM 1 % TD GEL: TRANSDERMAL | 1 refills | Status: DC

## 2018-02-02 NOTE — Progress Notes (Signed)
[name]. [place], [place] at [facility] [phone]  [name] - 67 y.o. male MRN [id number]  Date of birth: [birth year]  Visit Date: 02/02/2018  PCP: [name], MD   Referred by: [name], MD  Scribe(s) for today's visit: Wendy Poet, LAT, ATC  SUBJECTIVE:  [name] is here for Follow-up (L knee and L shoulder pain) .    Notes from OV on 06/24/17: Compared to the last office visit, his previously described L elbow pain symptoms are worsening, he c/o increased swelling.  Current symptoms are moderate & are nonradiating He has been wearing compression sleeve with some relief from swelling. He had steroid injection and asp in the past and tolerated well.   10/27/17: Compared to the last office visit, his previously described L elbow symptoms are worsening w/ increased L swelling noted.  Began about a week ago when he was moving a table from the bed of his truck when he fell. Current symptoms are mild & are nonradiating He has been wearing his compression sleeve.   His L shoulder pain symptoms INITIALLY: Began on 10/10/17 when he slipped and fell while trying to move a table out of the bed of his truck Described as 2/10 at rest and a 4/10 aching pain at it's worst , radiating to L upper arm Worsened with lifting and L shoulder aBd and ER Improved with rest Additional associated symptoms include: no N/T into the L UE; notes mechanical symptoms in the L shoulder    At this time symptoms are improving compared to onset He had been taking Hydrocodone that was prescribed in the ED but he has stopped taking those.  Went to [facility] and dx w/ L RC injury.  Was in a sling for a week which he stopped using this past Saturday (10/24/17)  12/08/2017:  L shoulder - Compared to the last office visit, his previously described symptoms show no change.  He states that the area on the superior L  shoulder has improved but is now having more pain along the deltoid. Current symptoms are moderate & are radiating to L lateral deltoid. He has not picked up his nitro patches due to getting very busy w/ moving.  He has also not tried his HEP also due to time constraints.  Pt states that he has done a lot of moving since his last visit and feels like both of his shoulders are bothering him more.  L knee -  Compared to the last office visit, his previously described symptoms are worsening w/ increased pain w/ climbing stairs. Current symptoms are 4-5/10 & are nonradiating He has been wearing his knee compression sleeve.   L elbow has improved since his last visit and has no current c/o regarding the L elbow.  He con't to wear his elbow sleeve.  02/02/2018: L Knee: Compared to the last office visit on 12/08/17, his previously described L knee pain symptoms are worsening.  He reports no change in his symptoms after getting an injection at his last visit.  He states that ascending stairs is problematic. Current symptoms are mild & are nonradiating He has been wearing a knee brace.  L Shoulder: Compared to the last office visit on 12/08/17, his previously described L shoulder symptoms are improving.  He feels that he's approximately 30% improved. Current symptoms are mild-moderate & are nonradiating He has been using the nitroglycerin patches 2x/week.  He has been  doing his HEP 5x/week.  L knee XR - 12/08/17 L shoulder XR - 10/10/17  REVIEW OF SYSTEMS: Reports night time disturbances. Reports fevers, chills, or night sweats.  Yes to night sweats. Denies unexplained weight loss. Denies personal history of cancer. Denies changes in bowel or bladder habits. Denies recent unreported falls. Denies new or worsening dyspnea or wheezing. Denies headaches or dizziness.  Denies numbness, tingling or weakness  In the extremities.  Denies dizziness or presyncopal episodes Denies lower extremity edema      HISTORY:  Prior history reviewed and updated per electronic medical record.  Social History   Occupational History  . Occupation: [employer]-retired  . Occupation: Sales  Tobacco Use  . Smoking status: Former Smoker    Types: Cigarettes    Last attempt to quit: 09/14/2007    Years since quitting: 10.4  . Smokeless tobacco: Never Used  . Tobacco comment: quit 2009  Substance and Sexual Activity  . Alcohol use: No    Alcohol/week: 0.0 standard drinks    Comment: rarely 1 beer in last 5 years  . Drug use: No    Types: Marijuana    Comment: pt stopped fall 2012  . Sexual activity: Never   Social History   Social History Narrative   Retired twice    Working with rescue dogs    Lives with girlfriend   Pet: 1 snake Teacher, early years/pre snake), 3 dogs    Caffeine- coffee 3 cups a day, tea- unsweet     DATA OBTAINED & REVIEWED:   Recent Labs    02/04/18 1115  HGBA1C 8.9*    L knee XR - 12/08/17 - mild degenerative changes medial compartment with trace knee effusion  L shoulder XR - 10/10/17 - normal  .   OBJECTIVE:  VS:  HT:5\' 8"  (172.7 cm)   WT:201 lb (91.2 kg)  BMI:30.57    BP:130/76  HR:88bpm  TEMP: ( )  RESP:96 %   PHYSICAL EXAM: CONSTITUTIONAL: Well-developed, Well-nourished and In no acute distress PSYCHIATRIC: Alert & appropriately interactive. and Not depressed or anxious appearing. RESPIRATORY: No increased work of breathing and Trachea Midline EYES: Pupils are equal., EOM intact without nystagmus. and No scleral icterus.  VASCULAR EXAM: Warm and well perfused NEURO: unremarkable  MSK Exam: Left knee  Well aligned, no significant deformity. No overlying skin changes. Medial and lateral joint line pain but this is minimal.   RANGE OF MOTION & STRENGTH  Flexion and extension of the knee is normal   SPECIALITY TESTING:  No significant effusion    Left Shoulder Exam: Well aligned, no significant deformity. No overlying skin changes. Neck: normal range of  motion and supple Mild TTP over the [facility] joint. Axial loading produces: Mild pain and Mild crepitation Drop arm test: negative  Internal Rotation:  ROM: Normal with no pain.   Strength: Normal External Rotation:  ROM: Normal with no pain.   Strength: Normal  Hawkins: normal, no pain Neers: normal, no pain  Empty Can: normal, no pain Strength: Normal Speed's: normal, no pain Strength: Normal O'Briens: normal, no pain Strength: Normal   ASSESSMENT   1. Rotator cuff syndrome of left shoulder   2. Chronic pain of left knee   3. Scapular dyskinesis   4. Rotator cuff syndrome of right shoulder   5. Primary osteoarthritis of left knee     PLAN:  Pertinent additional documentation may be included in corresponding procedure notes, imaging studies, problem based documentation and patient instructions.  Procedures:  .  None  Medications:  Meds ordered this encounter  Medications  . diclofenac sodium (VOLTAREN) 1 % GEL    Sig: Apply topically to affected area qid    Dispense:  100 g    Refill:  1   Discussion/Instructions: No problem-specific Assessment & Plan notes found for this encounter.  Marland Kitchen He is actually doing quite well.  Topical anti-inflammatories provided.  If any acute flareup can consider injection prior to follow-up. . Continue previously prescribed home exercise program.  . Discussed red flag symptoms that warrant earlier emergent evaluation and patient voices understanding. . Activity modifications and the importance of avoiding exacerbating activities (limiting pain to no more than a 4 / 10 during or following activity) recommended and discussed. . >50% of this 25 minutes minute visit spent in direct patient counseling and/or coordination of care. Discussion was focused on education regarding the in discussing the pathoetiology and anticipated clinical course of the above condition.  Follow-up:  . Return in about 10 weeks (around 04/13/2018).   . If any lack of  improvement consider: further diagnostic evaluation with MRI peer      CMA/ATC served as scribe during this visit. History, Physical, and Plan performed by medical provider. Documentation and orders reviewed and attested to.      Gerda Diss, [place] Sports [facility] Physician

## 2018-02-02 NOTE — Telephone Encounter (Signed)
Last OV 01/26/2018   Last refilled 05/08/2017 disp 90 with 2 refills   Sent to PCP for approval

## 2018-02-03 ENCOUNTER — Other Ambulatory Visit: Payer: Medicare Other

## 2018-02-04 ENCOUNTER — Other Ambulatory Visit (INDEPENDENT_AMBULATORY_CARE_PROVIDER_SITE_OTHER): Payer: Medicare Other

## 2018-02-04 DIAGNOSIS — I10 Essential (primary) hypertension: Secondary | ICD-10-CM

## 2018-02-04 DIAGNOSIS — E1165 Type 2 diabetes mellitus with hyperglycemia: Secondary | ICD-10-CM

## 2018-02-04 DIAGNOSIS — D3500 Benign neoplasm of unspecified adrenal gland: Secondary | ICD-10-CM | POA: Diagnosis not present

## 2018-02-04 DIAGNOSIS — IMO0001 Reserved for inherently not codable concepts without codable children: Secondary | ICD-10-CM

## 2018-02-04 LAB — HEMOGLOBIN A1C: HEMOGLOBIN A1C: 8.9 % — AB (ref 4.6–6.5)

## 2018-02-04 LAB — LIPID PANEL
CHOLESTEROL: 116 mg/dL (ref 0–200)
HDL: 37.9 mg/dL — AB (ref >39.00)
LDL Cholesterol: 68 mg/dL (ref 0–99)
NonHDL: 78.55
TRIGLYCERIDES: 53 mg/dL (ref 0.0–149.0)
Total CHOL/HDL Ratio: 3
VLDL: 10.6 mg/dL (ref 0.0–40.0)

## 2018-02-04 LAB — COMPREHENSIVE METABOLIC PANEL
ALBUMIN: 4.3 g/dL (ref 3.5–5.2)
ALT: 19 U/L (ref 0–53)
AST: 11 U/L (ref 0–37)
Alkaline Phosphatase: 73 U/L (ref 39–117)
BUN: 16 mg/dL (ref 6–23)
CALCIUM: 9.6 mg/dL (ref 8.4–10.5)
CO2: 26 mEq/L (ref 19–32)
CREATININE: 0.84 mg/dL (ref 0.40–1.50)
Chloride: 100 mEq/L (ref 96–112)
GFR: 96.81 mL/min (ref >60.00)
Glucose, Bld: 236 mg/dL — ABNORMAL HIGH (ref 70–99)
Potassium: 4 mEq/L (ref 3.5–5.1)
Sodium: 135 mEq/L (ref 135–145)
Total Bilirubin: 1.9 mg/dL — ABNORMAL HIGH (ref 0.2–1.2)
Total Protein: 6.8 g/dL (ref 6.0–8.3)

## 2018-02-04 LAB — CORTISOL: Cortisol, Plasma: 1.5 ug/dL

## 2018-02-05 ENCOUNTER — Other Ambulatory Visit: Payer: Medicare Other

## 2018-02-05 DIAGNOSIS — D3500 Benign neoplasm of unspecified adrenal gland: Secondary | ICD-10-CM | POA: Diagnosis not present

## 2018-02-09 ENCOUNTER — Other Ambulatory Visit: Payer: Self-pay | Admitting: Family Medicine

## 2018-02-09 DIAGNOSIS — R17 Unspecified jaundice: Secondary | ICD-10-CM

## 2018-02-10 LAB — ALDOSTERONE + RENIN ACTIVITY W/ RATIO
ALDO / PRA RATIO: 0.7 ratio — AB (ref 0.9–28.9)
Aldosterone: 1 ng/dL
Renin Activity: 1.4 ng/mL/h (ref 0.25–5.82)

## 2018-02-11 LAB — METANEPHRINES, URINE, 24 HOUR
METANEPH TOTAL UR: 813 ug/(24.h) (ref 224–832)
Metanephrines, Ur: 278 mcg/24 h (ref 90–315)
Normetanephrine, 24H Ur: 535 mcg/24 h (ref 122–676)
VOLUME, URINE-VMAUR: 2700 mL

## 2018-02-11 LAB — CATECHOLAMINES, FRACTIONATED, URINE, 24 HOUR
Calculated Total (E+NE): 93 mcg/24 h (ref 26–121)
Creatinine, Urine mg/day-CATEUR: 1.79 g/(24.h) (ref 0.50–2.15)
Dopamine, 24 hr Urine: 276 mcg/24 h (ref 52–480)
NOREPINEPHRINE, 24 HR UR: 93 ug/(24.h) (ref 15–100)
Volume, Urine-VMAUR: 2700 mL

## 2018-02-16 ENCOUNTER — Other Ambulatory Visit (INDEPENDENT_AMBULATORY_CARE_PROVIDER_SITE_OTHER): Payer: Medicare Other

## 2018-02-16 DIAGNOSIS — R17 Unspecified jaundice: Secondary | ICD-10-CM | POA: Diagnosis not present

## 2018-02-16 LAB — BILIRUBIN, FRACTIONATED(TOT/DIR/INDIR)
BILIRUBIN INDIRECT: 1 mg/dL (ref 0.2–1.2)
Bilirubin, Direct: 0.3 mg/dL — ABNORMAL HIGH (ref 0.0–0.2)
Total Bilirubin: 1.3 mg/dL — ABNORMAL HIGH (ref 0.2–1.2)

## 2018-02-17 ENCOUNTER — Telehealth: Payer: Self-pay | Admitting: Cardiovascular Disease

## 2018-02-17 NOTE — Telephone Encounter (Signed)
Pt c/o of Chest Pain: STAT if CP now or developed within 24 hours  1. Are you having CP right now? No  2. Are you experiencing any other symptoms (ex. SOB, nausea, vomiting, sweating)? No   3. How long have you been experiencing CP? Noticed last 3 days   4. Is your CP continuous or coming and going? Intermittent   5. Have you taken Nitroglycerin? No   Under a lot of stress with moving and house hunting and family issues.  Patient wants to know if this is stress or a blockage   Patient is ok for now with a November appt as scheduled  ?

## 2018-02-18 ENCOUNTER — Ambulatory Visit (INDEPENDENT_AMBULATORY_CARE_PROVIDER_SITE_OTHER): Payer: Medicare Other | Admitting: Family Medicine

## 2018-02-18 ENCOUNTER — Encounter: Payer: Self-pay | Admitting: Family Medicine

## 2018-02-18 VITALS — BP 120/60 | HR 91 | Temp 98.4°F | Ht 68.0 in | Wt 195.8 lb

## 2018-02-18 DIAGNOSIS — G4700 Insomnia, unspecified: Secondary | ICD-10-CM | POA: Diagnosis not present

## 2018-02-18 MED ORDER — TRAZODONE HCL 50 MG PO TABS: 25.0000 mg | ORAL_TABLET | Freq: Every evening | ORAL | 0 refills | Status: DC | PRN

## 2018-02-18 NOTE — Progress Notes (Signed)
Subjective:    Patient ID: [name], male    DOB: Oct 05, 1950, 67 y.o.   MRN: [id number]  HPI  Patient presents to clinic complaining of insomnia.  States he is not been able to sleep well at night.  States he has been using Tylenol PM, but this makes him feel kind of hung over and grabbing the next day.  Has tried sleeping without any sort of over-the-counter sleep aid and does notice that his sleeplessness is worse without the Tylenol PM.  Patient has never tried anything such as trazodone or Remeron in the past to help him sleep.  Denies any feelings of being down or depressed.  He is looking forward to his wedding upcoming on November 9.  Patient Active Problem List   Diagnosis Date Noted  . Ingrown toenail 01/26/2018  . 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 Right shoulder pain 03/03/2017  . History of colon polyps 12/01/2016  . Olecranon bursitis of left elbow 12/01/2016  . OSA (obstructive sleep apnea) 07/30/2016  . Low back pain 05/15/2016  . Actinic keratoses 02/07/2016  . Right hip pain 02/07/2016  . Ischemic cardiomyopathy   . Dysphagia 04/22/2015  . Diabetes mellitus type 2, uncontrolled, without complications ([place]) 78/29/5621  . Sleep disturbance 08/08/2013  . Angina at rest [facility]) 04/12/2011  . CAD (coronary artery disease) 04/12/2011  . HTN (hypertension) 04/12/2011  . Hyperlipemia 04/12/2011   Social History   Tobacco Use  . Smoking status: Former Smoker    Types: Cigarettes    Last attempt to quit: 09/14/2007    Years since quitting: 10.4  . Smokeless tobacco: Never Used  . Tobacco comment: quit 2009  Substance Use Topics  . Alcohol use: No    Alcohol/week: 0.0 standard drinks    Comment: rarely 1 beer in last 5 years   Review of Systems  Constitutional: Negative for chills, fatigue and fever.  HENT: Negative for congestion, ear pain, sinus pain and sore throat.   Eyes: Negative.   Respiratory: Negative for cough, shortness of breath and wheezing.    Cardiovascular: Negative for chest pain, palpitations and leg swelling.  Gastrointestinal: Negative for abdominal pain, diarrhea, nausea and vomiting.  Genitourinary: Negative for dysuria, frequency and urgency.  Musculoskeletal: Negative for arthralgias and myalgias.  Skin: Negative for color change, pallor and rash.  Neurological: Negative for syncope, light-headedness and headaches.  Psychiatric/Behavioral: +insomnia      Objective:   Physical Exam   Constitutional: He appears well-developed and well-nourished. No distress.  Head: Normocephalic and atraumatic.  Eyes: Conjunctivae and EOM are normal. No scleral icterus.  Neck: Neck supple. No tracheal deviation present.  Cardiovascular: Normal rate, regular rhythm and normal heart sounds.  Pulmonary/Chest: Effort normal and breath sounds normal. No respiratory distress. He has no wheezes. He has no rales.  Neurological: He is alert and oriented to person, place, and time. Gait normal  Skin: Skin is warm and dry. He is not diaphoretic. No pallor.  Psychiatric: He has a normal mood and affect. His behavior is normal. Thought content normal.   Nursing note and vitals reviewed.     Vitals:   02/18/18 0928  BP: 120/60  Pulse: 91  Temp: 98.4 F (36.9 C)  SpO2: 98%    Assessment & Plan:    Insomnia-patient will trial 25 mg to 50 mg of trazodone as needed at bedtime for sleep.  Discussed good sleep hygiene including doing a wind down routine, not using TV or phones 30 minutes  prior to sleeping, deep breathing techniques all to help him sleep.  Patient will follow-up in approximately 4 weeks to recheck on how insomnia is doing after trial of trazodone.

## 2018-02-19 ENCOUNTER — Other Ambulatory Visit: Payer: Self-pay | Admitting: Family Medicine

## 2018-02-19 DIAGNOSIS — R17 Unspecified jaundice: Secondary | ICD-10-CM

## 2018-02-21 ENCOUNTER — Encounter: Payer: Self-pay | Admitting: Sports Medicine

## 2018-02-26 NOTE — Telephone Encounter (Signed)
I left a message for the patient to call. 
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
## 2018-03-12 ENCOUNTER — Encounter: Payer: Self-pay | Admitting: Cardiovascular Disease

## 2018-03-12 ENCOUNTER — Ambulatory Visit (INDEPENDENT_AMBULATORY_CARE_PROVIDER_SITE_OTHER): Payer: Medicare Other | Admitting: Cardiovascular Disease

## 2018-03-12 VITALS — BP 130/68 | HR 86 | Ht 68.0 in | Wt 199.2 lb

## 2018-03-12 DIAGNOSIS — I25118 Atherosclerotic heart disease of native coronary artery with other forms of angina pectoris: Secondary | ICD-10-CM

## 2018-03-12 DIAGNOSIS — G4733 Obstructive sleep apnea (adult) (pediatric): Secondary | ICD-10-CM

## 2018-03-12 DIAGNOSIS — E1165 Type 2 diabetes mellitus with hyperglycemia: Secondary | ICD-10-CM | POA: Diagnosis not present

## 2018-03-12 DIAGNOSIS — E7800 Pure hypercholesterolemia, unspecified: Secondary | ICD-10-CM

## 2018-03-12 DIAGNOSIS — I255 Ischemic cardiomyopathy: Secondary | ICD-10-CM

## 2018-03-12 DIAGNOSIS — I10 Essential (primary) hypertension: Secondary | ICD-10-CM | POA: Diagnosis not present

## 2018-03-12 DIAGNOSIS — IMO0001 Reserved for inherently not codable concepts without codable children: Secondary | ICD-10-CM

## 2018-03-12 NOTE — Patient Instructions (Signed)

## 2018-03-22 NOTE — Progress Notes (Signed)
Subjective:    Patient ID: [name], male    DOB: 1950/08/26, 67 y.o.   MRN: [id number]  HPI  Presents to clinic to follow up on insomnia after starting trazodone 50 mg at HS. States he gave the medication a try at both the 25 mg dose and 50 mg dose on multiple occasions, but did not have any effect.  Patient states he switched back to taking his Tylenol PM, uses 1 to 2 tablets at night and this was much more effective to help him sleep.  States other sleep medications he has tried in the past make him feel like he has a sleep hangover the next day, but Tylenol PM does not make him feel this way.  States with Tylenol PM he feels he gets restful sleep.  Patient states he also takes breaks from taking the Tylenol PM, will go a few days without taking it, and when he takes it again it seems to be more effective after the hiatus from the medication.   Patient Active Problem List   Diagnosis Date Noted  . Insomnia 02/18/2018  . Ingrown toenail 01/26/2018  . 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 Right shoulder pain 03/03/2017  . History of colon polyps 12/01/2016  . Olecranon bursitis of left elbow 12/01/2016  . OSA (obstructive sleep apnea) 07/30/2016  . Low back pain 05/15/2016  . Actinic keratoses 02/07/2016  . Right hip pain 02/07/2016  . Ischemic cardiomyopathy   . Dysphagia 04/22/2015  . Diabetes mellitus type 2, uncontrolled, without complications ([place]) 40/98/1191  . Sleep disturbance 08/08/2013  . Angina at rest [facility]) 04/12/2011  . CAD (coronary artery disease) 04/12/2011  . HTN (hypertension) 04/12/2011  . Hyperlipemia 04/12/2011   Social History   Tobacco Use  . Smoking status: Former Smoker    Types: Cigarettes    Last attempt to quit: 09/14/2007    Years since quitting: 10.5  . Smokeless tobacco: Never Used  . Tobacco comment: quit 2009  Substance Use Topics  . Alcohol use: No    Alcohol/week: 0.0 standard drinks    Comment: rarely 1 beer in last 5 years     Review of Systems  Constitutional: Negative for chills, fatigue and fever.  HENT: Negative for congestion, ear pain, sinus pain and sore throat.   Eyes: Negative.   Respiratory: Negative for cough, shortness of breath and wheezing.   Cardiovascular: Negative for chest pain, palpitations and leg swelling.  Gastrointestinal: Negative for abdominal pain, diarrhea, nausea and vomiting.  Genitourinary: Negative for dysuria, frequency and urgency.  Musculoskeletal: Negative for arthralgias and myalgias.  Skin: Negative for color change, pallor and rash.  Neurological: Negative for syncope, light-headedness and headaches.  Psychiatric/Behavioral: The patient is not nervous/anxious.       Objective:   Physical Exam  Constitutional: He appears well-developed and well-nourished. No distress.  Head: Normocephalic and atraumatic.  Eyes: Conjunctivae and EOM are normal. No scleral icterus.  Neck: Normal range of motion. Neck supple. No tracheal deviation present.  Cardiovascular: Normal rate, regular rhythm and normal heart sounds.  Pulmonary/Chest: Effort normal and breath sounds normal. No respiratory distress. He has no wheezes. He has no rales.  Neurological: He is alert and oriented to person, place, and time. Gait normal  Skin: Skin is warm and dry. He is not diaphoretic. No pallor.  Psychiatric: He has a normal mood and affect. His behavior is normal. Thought content normal.   Nursing note and vitals reviewed.     Vitals:  03/23/18 0932  BP: 130/72  Pulse: 89  Temp: 98.2 F (36.8 C)  SpO2: 96%   Assessment & Plan:   Insomnia - trazodone trial was not effective, so patient will no longer take this medication.  Patient advised that he can continue to use the tylenol p.m. 1 to 2 tablets at night as needed to help with sleep.  This medication is quite effective for him and he does not have any adverse side effects the next day upon waking.  Keep regularly scheduled follow-up  as planned in January 2020.  Patient aware he can call clinic at any time to come in for appointment if new issues arise.

## 2018-03-23 ENCOUNTER — Ambulatory Visit (INDEPENDENT_AMBULATORY_CARE_PROVIDER_SITE_OTHER): Payer: Medicare Other | Admitting: Family Medicine

## 2018-03-23 ENCOUNTER — Encounter: Payer: Self-pay | Admitting: Family Medicine

## 2018-03-23 VITALS — BP 130/72 | HR 89 | Temp 98.2°F | Ht 68.0 in | Wt 202.2 lb

## 2018-03-23 DIAGNOSIS — G4700 Insomnia, unspecified: Secondary | ICD-10-CM | POA: Diagnosis not present

## 2018-03-23 NOTE — Patient Instructions (Signed)
Trazodone not effective  Will continue to use tylenol PM to help with sleep

## 2018-03-29 ENCOUNTER — Ambulatory Visit (INDEPENDENT_AMBULATORY_CARE_PROVIDER_SITE_OTHER): Payer: Medicare Other | Admitting: Podiatry

## 2018-03-29 ENCOUNTER — Encounter: Payer: Self-pay | Admitting: Podiatry

## 2018-03-29 VITALS — BP 133/75 | HR 94

## 2018-03-29 DIAGNOSIS — L03032 Cellulitis of left toe: Secondary | ICD-10-CM

## 2018-03-29 DIAGNOSIS — E119 Type 2 diabetes mellitus without complications: Secondary | ICD-10-CM

## 2018-03-29 DIAGNOSIS — L600 Ingrowing nail: Secondary | ICD-10-CM | POA: Diagnosis not present

## 2018-03-29 DIAGNOSIS — I255 Ischemic cardiomyopathy: Secondary | ICD-10-CM | POA: Diagnosis not present

## 2018-03-29 NOTE — Progress Notes (Signed)
This patient presents the office with chief complaint of an ingrowing toenail on the inside border of the left great toe.  He states he has been dealing with this problem for years but it has become red and swollen for the last 2 months.  He says he has provided no self treatment nor sought any professional help.  He presents the office today for definitive evaluation and treatment of this painful ingrowing toenail  Vascular  Dorsalis pedis and posterior tibial pulses are palpable  B/L.  Capillary return  WNL.  Temperature gradient is  WNL.  Skin turgor  WNL  Sensorium  Senn Weinstein monofilament wire  WNL. Normal tactile sensation.  Nail Exam  Patient has normal nails with no evidence of bacterial or fungal infection.  Marked incurvation noted along the medial border of the left great toenail.  Redness and swelling noted at the medial aspect left great toenail.  Orthopedic  Exam  Muscle tone and muscle strength  WNL.  No limitations of motion feet  B/L.  No crepitus or joint effusion noted.  Foot type is unremarkable and digits show no abnormalities.  Bony prominences are unremarkable.  Skin  No open lesions.  Normal skin texture and turgor   .Ingrown toenail left hallux.  Paronychia left hallux.  IE  Nail surgery.  Treatment options and alternatives discussed.  Recommended permanent phenol matrixectomy and patient agreed.  Left hallux was prepped with alcohol and a toe block of 3cc of 2% lidocaine plain was administered in a digital toe block. .  The toe was then prepped with betadine solution . A tourniquet was applied to toe. The offending nail border was then excised and matrix tissue exposed.  Phenol was then applied to the matrix tissue followed by an alcohol wash.  Antibiotic ointment and a dry sterile dressing was applied.  The patient was dispensed instructions for aftercare. RTC 10 days     Gardiner Barefoot DPM

## 2018-03-29 NOTE — Patient Instructions (Signed)

## 2018-04-01 ENCOUNTER — Telehealth: Payer: Self-pay | Admitting: Podiatry

## 2018-04-01 NOTE — Telephone Encounter (Signed)
Pt wanted to say thank you for helping him on Monday

## 2018-04-02 ENCOUNTER — Other Ambulatory Visit: Payer: Self-pay | Admitting: Cardiovascular Disease

## 2018-04-05 ENCOUNTER — Ambulatory Visit (INDEPENDENT_AMBULATORY_CARE_PROVIDER_SITE_OTHER): Payer: Medicare Other | Admitting: Podiatry

## 2018-04-05 ENCOUNTER — Encounter: Payer: Self-pay | Admitting: Podiatry

## 2018-04-05 DIAGNOSIS — Z09 Encounter for follow-up examination after completed treatment for conditions other than malignant neoplasm: Secondary | ICD-10-CM

## 2018-04-05 DIAGNOSIS — E119 Type 2 diabetes mellitus without complications: Secondary | ICD-10-CM

## 2018-04-05 NOTE — Progress Notes (Signed)
This patient returns to the office following nail surgery one week ago.  The patient says toe has been soaked and bandaged as directed.  There has been improvement of the toe since the surgery has been performed. The patient presents for continued evaluation and treatment.  GENERAL APPEARANCE: Alert, conversant. Appropriately groomed. No acute distress.  VASCULAR: Pedal pulses palpable at  DP and PT bilateral.  Capillary refill time is immediate to all digits,  Normal temperature gradient.    NEUROLOGIC: sensation is normal to 5.07 monofilament at 5/5 sites bilateral.  Light touch is intact bilateral, Muscle strength normal.  MUSCULOSKELETAL: acceptable muscle strength, tone and stability bilateral.  Intrinsic muscluature intact bilateral.  Rectus appearance of foot and digits noted bilateral.   DERMATOLOGIC: skin color, texture, and turgor are within normal limits.  No preulcerative lesions or ulcers  are seen, no interdigital maceration noted.   NAILS  There is necrotic tissue along the nail groove  In the absence of redness swelling and pain.  DX  S/p nail surgery  ROV  Home instructions were discussed.  Patient to call the office if there are any questions or concerns.   Elgie Maziarz DPM   

## 2018-04-12 ENCOUNTER — Ambulatory Visit: Payer: Medicare Other | Admitting: Gastroenterology

## 2018-04-12 ENCOUNTER — Encounter: Payer: Self-pay | Admitting: Gastroenterology

## 2018-04-13 ENCOUNTER — Encounter: Payer: Self-pay | Admitting: Sports Medicine

## 2018-04-13 ENCOUNTER — Ambulatory Visit (INDEPENDENT_AMBULATORY_CARE_PROVIDER_SITE_OTHER): Payer: Medicare Other | Admitting: Sports Medicine

## 2018-04-13 VITALS — BP 110/68 | HR 95 | Ht 68.0 in | Wt 199.6 lb

## 2018-04-13 DIAGNOSIS — M75101 Unspecified rotator cuff tear or rupture of right shoulder, not specified as traumatic: Secondary | ICD-10-CM | POA: Diagnosis not present

## 2018-04-13 DIAGNOSIS — G8929 Other chronic pain: Secondary | ICD-10-CM | POA: Diagnosis not present

## 2018-04-13 DIAGNOSIS — M25532 Pain in left wrist: Secondary | ICD-10-CM | POA: Diagnosis not present

## 2018-04-13 DIAGNOSIS — G2589 Other specified extrapyramidal and movement disorders: Secondary | ICD-10-CM | POA: Diagnosis not present

## 2018-04-13 DIAGNOSIS — I255 Ischemic cardiomyopathy: Secondary | ICD-10-CM

## 2018-04-13 DIAGNOSIS — M25562 Pain in left knee: Secondary | ICD-10-CM | POA: Diagnosis not present

## 2018-04-13 DIAGNOSIS — M1712 Unilateral primary osteoarthritis, left knee: Secondary | ICD-10-CM

## 2018-04-13 DIAGNOSIS — M75102 Unspecified rotator cuff tear or rupture of left shoulder, not specified as traumatic: Secondary | ICD-10-CM

## 2018-04-13 MED ORDER — NITROGLYCERIN 0.2 MG/HR TD PT24: MEDICATED_PATCH | TRANSDERMAL | 1 refills | Status: DC
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
## 2018-04-22 ENCOUNTER — Telehealth: Payer: Self-pay

## 2018-04-22 MED ORDER — RAMIPRIL 2.5 MG PO CAPS: 2.5000 mg | ORAL_CAPSULE | Freq: Every morning | ORAL | 3 refills | Status: DC

## 2018-04-22 NOTE — Telephone Encounter (Signed)
Refill sent to [facility] for 90 day supply for Ramipril 2.5 mg take one tablet every am.

## 2018-04-29 ENCOUNTER — Encounter: Payer: Self-pay | Admitting: Family Medicine

## 2018-05-01 ENCOUNTER — Other Ambulatory Visit: Payer: Self-pay | Admitting: Family Medicine

## 2018-05-22 ENCOUNTER — Emergency Department: Payer: Medicare Other

## 2018-05-22 ENCOUNTER — Encounter: Payer: Self-pay | Admitting: Emergency Medicine

## 2018-05-22 ENCOUNTER — Emergency Department
Admission: EM | Admit: 2018-05-22 | Discharge: 2018-05-22 | Discharge status: Against Medical Advice | Payer: Medicare Other | Attending: Emergency Medicine | Admitting: Emergency Medicine

## 2018-05-22 ENCOUNTER — Other Ambulatory Visit: Payer: Self-pay

## 2018-05-22 DIAGNOSIS — Z5321 Procedure and treatment not carried out due to patient leaving prior to being seen by health care provider: Secondary | ICD-10-CM | POA: Diagnosis not present

## 2018-05-22 DIAGNOSIS — R109 Unspecified abdominal pain: Secondary | ICD-10-CM | POA: Diagnosis not present

## 2018-05-22 DIAGNOSIS — R1031 Right lower quadrant pain: Secondary | ICD-10-CM | POA: Insufficient documentation

## 2018-05-22 HISTORY — DX: Disorder of kidney and ureter, unspecified: N28.9

## 2018-05-22 LAB — BASIC METABOLIC PANEL
ANION GAP: 10 (ref 5–15)
BUN: 21 mg/dL (ref 8–23)
CO2: 22 mmol/L (ref 22–32)
Calcium: 10.2 mg/dL (ref 8.9–10.3)
Chloride: 105 mmol/L (ref 98–111)
Creatinine, Ser: 1.13 mg/dL (ref 0.61–1.24)
GFR calc non Af Amer: >60 mL/min (ref >60)
Glucose, Bld: 182 mg/dL — ABNORMAL HIGH (ref 70–99)
POTASSIUM: 4.1 mmol/L (ref 3.5–5.1)
SODIUM: 137 mmol/L (ref 135–145)

## 2018-05-22 LAB — CBC
HCT: 47.3 % (ref 39.0–52.0)
HEMOGLOBIN: 16 g/dL (ref 13.0–17.0)
MCH: 31.2 pg (ref 26.0–34.0)
MCHC: 33.8 g/dL (ref 30.0–36.0)
MCV: 92.2 fL (ref 80.0–100.0)
NRBC: 0 % (ref 0.0–0.2)
Platelets: 202 10*3/uL (ref 150–400)
RBC: 5.13 MIL/uL (ref 4.22–5.81)
RDW: 12.5 % (ref 11.5–15.5)
WBC: 9 10*3/uL (ref 4.0–10.5)

## 2018-05-22 MED ORDER — ONDANSETRON HCL 4 MG/2ML IJ SOLN
4.0000 mg | Freq: Once | INTRAMUSCULAR | Status: AC
  Administered 2018-05-22: 4 mg via INTRAVENOUS

## 2018-05-22 MED ORDER — FENTANYL CITRATE (PF) 100 MCG/2ML IJ SOLN
50.0000 ug | INTRAMUSCULAR | Status: DC | PRN
  Administered 2018-05-22: 50 ug via INTRAVENOUS
  Filled 2018-05-22: qty 2

## 2018-05-22 MED ORDER — FENTANYL CITRATE (PF) 100 MCG/2ML IJ SOLN
INTRAMUSCULAR | Status: AC
  Administered 2018-05-22: 50 ug via INTRAVENOUS
  Filled 2018-05-22: qty 2

## 2018-05-22 MED ORDER — FENTANYL CITRATE (PF) 100 MCG/2ML IJ SOLN
50.0000 ug | Freq: Once | INTRAMUSCULAR | Status: AC
  Administered 2018-05-22: 50 ug via INTRAVENOUS

## 2018-05-22 MED ORDER — ONDANSETRON HCL 4 MG/2ML IJ SOLN
INTRAMUSCULAR | Status: AC
  Administered 2018-05-22: 4 mg via INTRAVENOUS
  Filled 2018-05-22: qty 2

## 2018-05-22 NOTE — ED Triage Notes (Signed)
C/O right lower back pain for "days" but today pain became more to flank.  States does have history of renal colic, but it has been a long time since he had one.

## 2018-05-22 NOTE — ED Notes (Signed)
States pain returning to 8/10.  Sr. Joni Fears alerted and additional dose of fentanyl ordered and administered, see MAR.

## 2018-05-26 ENCOUNTER — Telehealth: Payer: Self-pay | Admitting: Emergency Medicine

## 2018-05-26 NOTE — Telephone Encounter (Signed)
Called patient due to lwot to inquire about condition and follow up plans. Says he is better. I asked him to let his doctor know about his pain and his ed visit and have them review his test results.  He has appt next week.

## 2018-05-31 ENCOUNTER — Ambulatory Visit (INDEPENDENT_AMBULATORY_CARE_PROVIDER_SITE_OTHER): Payer: Medicare Other | Admitting: Gastroenterology

## 2018-05-31 ENCOUNTER — Encounter: Payer: Self-pay | Admitting: Gastroenterology

## 2018-05-31 ENCOUNTER — Encounter

## 2018-05-31 ENCOUNTER — Ambulatory Visit: Payer: Medicare Other | Admitting: Family Medicine

## 2018-05-31 NOTE — Progress Notes (Signed)
Jonathon Bellows MD, MRCP(U.K) [address]  [place]  [place], [place] [address]  Main: [phone]  Fax: [phone]   Gastroenterology Consultation  Referring Provider:     [name], MD Primary Care Physician:  [name], MD Primary Gastroenterologist:  Dr. Jonathon Bellows  Reason for Consultation:     Elevated bilirubin         HPI:   [name] is a 68 y.o. y/o male referred for consultation & management  by Dr. Caryl Bis, Angela Adam, MD.     He has been referred for an elevated bilirubin.  Abnormal indirect bilirubin levels for over 7 years. Negative for hepatitis B/C. CT abdomen in 08/2017 - normal.Prior RUQ USG was also normal   Hepatic Function Latest Ref Rng & Units 02/16/2018 02/04/2018 08/20/2016  Total Protein 6.0 - 8.3 g/dL - 6.8 7.1  Albumin 3.5 - 5.2 g/dL - 4.3 4.7  AST 0 - 37 U/L - 11 20  ALT 0 - 53 U/L - 19 29  Alk Phosphatase 39 - 117 U/L - 73 67  Total Bilirubin 0.2 - 1.2 mg/dL 1.3(H) 1.9(H) 1.2  Bilirubin, Direct 0.0 - 0.2 mg/dL 0.3(H) - -   Denies any alcohol use.     Past Medical History:  Diagnosis Date  . Angina   . Asthma   . Bursitis of left elbow 2018  . CAD (coronary artery disease) 04/12/2011   a. remote MI in 1996; b. MI 1997 s/p stenting to LAD & diag; c. MI 2009: long stenting from pLAD to mid LAD and diag & POBA of jailed diag branch; d. cath 2010: 80% ISR of LAD w/ L-R collats, med Rx; e. cath 2012: 50-60% tub mLAD, 60-70% dLAD, FFR 0.75 -->s/p PCI dissection repaired w/ 2 stents (3 total); f. cath 06/2012: no changes from 2012 cath, med Rx, patent stents   . Diabetes mellitus without complication ([place])   . ED (erectile dysfunction)   . Heart disease   . HTN (hypertension) 04/12/2011  . Hyperlipemia 04/12/2011  . Ischemic cardiomyopathy    a. echo 2010: EF 45-50%, mild to mod ant and apical wall HK, trace MR; b. cardiac cath 06/2012: EF 40%, mild MR   . Myocardial infarction ([place])    x 5  . Obesity   . OSA  (obstructive sleep apnea)    on CPAP  . Osteoarthritis   . Renal disorder    kidney stone    Past Surgical History:  Procedure Laterality Date  . CORONARY ANGIOPLASTY     x 5  . CORONARY STENT PLACEMENT     x 7  . LEFT HEART CATHETERIZATION WITH CORONARY ANGIOGRAM N/A 04/14/2011   Procedure: LEFT HEART CATHETERIZATION WITH CORONARY ANGIOGRAM;  Surgeon: Pixie Casino, MD;  [facility] C[facility] LAB;  Service: Cardiovascular;  Laterality: N/A;  . LEFT HEART CATHETERIZATION WITH CORONARY ANGIOGRAM N/A 06/25/2012   Procedure: LEFT HEART CATHETERIZATION WITH CORONARY ANGIOGRAM;  Surgeon: Peter M Martinique, MD;  Location: [facility] CATH LAB;  Service: Cardiovascular;  Laterality: N/A;  . [place]    Prior to Admission medications   Medication Sig Start Date End Date Taking? Authorizing Provider  aspirin EC 81 MG tablet Take 1 tablet (81 mg total) by mouth daily. 10/02/16  Yes Dunn, Areta Haber, PA-C  atorvastatin (LIPITOR) 80 MG tablet Take 1 tablet (80 mg total) by mouth daily. 08/21/17  Yes Minna Merritts, MD  blood glucose meter kit and  supplies KIT Dispense based on patient and insurance preference. Use once daily fasting. (FOR ICD 10 DIAGNOSIS OF E11.59). 05/15/16  Yes [name], MD  carvedilol (COREG) 6.25 MG tablet Take 1 tablet (6.25 mg total) by mouth 2 (two) times daily with a meal. 04/02/18  Yes Gollan, Kathlene November, MD  diclofenac sodium (VOLTAREN) 1 % GEL Apply topically to affected area qid 02/02/18  Yes Gerda Diss, DO  JANUVIA 100 MG tablet TAKE 1 TABLET DAILY 02/02/18  Yes [name], MD  JARDIANCE 25 MG TABS tablet TAKE 1 TABLET DAILY 05/03/18  Yes [name], MD  Lactobacillus (ACIDOPHILUS PROBIOTIC) 100 MG CAPS Take 1 capsule (100 mg total) by mouth daily. Patient taking differently: Take 100 mg by mouth daily as needed.  04/23/15  Yes Bettey Costa, MD  metFORMIN (GLUCOPHAGE) 1000 MG tablet Take 1 tablet (1,000 mg total) by mouth 2 (two)  times daily with a meal. 01/04/18  Yes [name], MD  Multiple Vitamins-Minerals (MULTIVITAMINS THER. W/MINERALS) TABS Take 1 tablet by mouth every morning.    Yes [provider]  niacin (NIASPAN) 1000 MG CR tablet TAKE 2 TABLETS (2000 MG TOTAL) AT BEDTIME 08/21/17  Yes Gollan, Kathlene November, MD  nitroGLYCERIN (NITRODUR - DOSED IN MG/24 HR) 0.2 mg/hr patch Place 1/4 to 1/2 of a patch over affected region. Remove and replace once daily.  Slightly alter skin placement daily 04/13/18  Yes Gerda Diss, DO  nitroGLYCERIN (NITROLINGUAL) 0.4 MG/SPRAY spray Place 1 spray under the tongue every 5 (five) minutes as needed for chest pain. 11/17/14  Yes Doss, Velora Heckler, RN  prasugrel (EFFIENT) 10 MG TABS tablet Take 1 tablet (10 mg total) by mouth daily. 04/02/18  Yes Wellington Hampshire, MD  ramipril (ALTACE) 2.5 MG capsule Take 1 capsule (2.5 mg total) by mouth every morning. 04/22/18  Yes Minna Merritts, MD  ranolazine (RANEXA) 1000 MG SR tablet Take 1 tablet (1,000 mg total) by mouth 2 (two) times daily. 04/02/18  Yes Wellington Hampshire, MD  sildenafil (VIAGRA) 100 MG tablet Take 1 tablet (100 mg total) by mouth daily as needed for erectile dysfunction. 03/14/13  Yes Minna Merritts, MD    Family History  Problem Relation Age of Onset  . COPD Mother   . Other Father        muscle tumor  . Stroke Father   . Prostate cancer Father        mets  . Heart disease Father   . Alcoholism Father   . Stroke Sister   . Bladder Cancer Neg Hx   . Kidney cancer Neg Hx      Social History   Tobacco Use  . Smoking status: Former Smoker    Types: Cigarettes    Last attempt to quit: 09/14/2007    Years since quitting: 10.7  . Smokeless tobacco: Never Used  . Tobacco comment: quit 2009  Substance Use Topics  . Alcohol use: No    Alcohol/week: 0.0 standard drinks    Comment: rarely 1 beer in last 5 years  . Drug use: No    Types: Marijuana    Comment: pt stopped fall 2012    Allergies as  of 05/31/2018 - Review Complete 05/31/2018  Allergen Reaction Noted  . Atarax [hydroxyzine hcl]  04/12/2011  . Hydroxyzine hcl Other (See Comments) 04/12/2011  . Latex Rash 06/23/2012    Review of Systems:    All systems reviewed and negative except  where noted in HPI.   Physical Exam:  BP (!) 147/82   Pulse (!) 112   Ht '5\' 8"'  (1.727 m)   Wt 203 lb (92.1 kg)   BMI 30.87 kg/m  No LMP for male patient. Psych:  Alert and cooperative. Normal mood and affect. General:   Alert,  Well-developed, well-nourished, pleasant and cooperative in NAD Head:  Normocephalic and atraumatic. Eyes:  Sclera clear, no icterus.   Conjunctiva pink. Ears:  Normal auditory acuity. Nose:  No deformity, discharge, or lesions. Mouth:  No deformity or lesions,oropharynx pink & moist. Neck:  Supple; no masses or thyromegaly. Lungs:  Respirations even and unlabored.  Clear throughout to auscultation.   No wheezes, crackles, or rhonchi. No acute distress. Heart:  Regular rate and rhythm; no murmurs, clicks, rubs, or gallops. Abdomen:  Normal bowel sounds.  No bruits.  Soft, non-tender and non-distended without masses, hepatosplenomegaly or hernias noted.  No guarding or rebound tenderness.    Neurologic:  Alert and oriented x3;  grossly normal neurologically. Skin:  Intact without significant lesions or rashes. No jaundice. Lymph Nodes:  No significant cervical adenopathy. Psych:  Alert and cooperative. Normal mood and affect.  Imaging Studies: Dg Abdomen 1 View  Result Date: 05/22/2018 CLINICAL DATA:  Initial evaluation for acute right flank pain. EXAM: ABDOMEN - 1 VIEW COMPARISON:  Prior CT from 08/25/2017. FINDINGS: Bowel gas pattern within normal limits without evidence for obstruction or ileus. Large volume retained stool within the colon, suggesting constipation. There is a 4 mm calcific density seen just to the right of the L1 vertebral body, indeterminate, but could reflect a right renal calculus. No  other abnormal calcification. No soft tissue mass. Visualized lung bases are clear. Scattered degenerative changes noted within the visualized spine and about the hips bilaterally. IMPRESSION: 1. 4 mm calcific density to the right of the L1 vertebral body, indeterminate, but could reflect a right renal calculus. 2. Large volume retained stool within the colon, suggesting constipation. Electronically Signed   By: Jeannine Boga M.D.   On: 05/22/2018 19:11    Assessment and Plan:   Coady Train is a 68 y.o. y/o male has been referred for  elevated bilirubin. Based on prior labs long(over 7 years ) standing predominantly an indirect hyperbilirubinemia suggestive of Gilberts disease. This is a benign condition and levels of elevated bilirubin may be elevated during periods of stress. Does not need any further evaluation or treatment. Patient has been provided a print out of this condition.   Follow up PRN  Dr Jonathon Bellows MD,MRCP(U.K)

## 2018-06-03 ENCOUNTER — Ambulatory Visit (INDEPENDENT_AMBULATORY_CARE_PROVIDER_SITE_OTHER): Payer: Medicare Other

## 2018-06-03 VITALS — BP 116/64 | HR 90 | Temp 97.7°F | Resp 15 | Ht 67.0 in | Wt 202.8 lb

## 2018-06-03 DIAGNOSIS — Z23 Encounter for immunization: Secondary | ICD-10-CM | POA: Diagnosis not present

## 2018-06-03 DIAGNOSIS — Z0000 Encounter for general adult medical examination without abnormal findings: Secondary | ICD-10-CM | POA: Diagnosis not present

## 2018-06-03 NOTE — Progress Notes (Signed)
 Subjective:   [name] is a 67 y.o. male who presents for Medicare Annual/Subsequent preventive examination.  Review of Systems:  No ROS.  Medicare Wellness Visit. Additional risk factors are reflected in the social history.  Cardiac Risk Factors include: advanced age (>55men, >65 women);diabetes mellitus;hypertension;male gender     Objective:    Vitals: BP 116/64 (BP Location: Right Arm, Patient Position: Sitting, Cuff Size: Normal)   Pulse 90   Temp 97.7 F (36.5 C) (Oral)   Resp 15   Ht 5' 7" (1.702 m)   Wt 202 lb 12.8 oz (92 kg)   SpO2 95%   BMI 31.76 kg/m   Body mass index is 31.76 kg/m.  Advanced Directives 06/03/2018 05/22/2018 04/22/2015 04/12/2011  Does Patient Have a Medical Advance Directive? No No No Patient does not have advance directive  Would patient like information on creating a medical advance directive? Yes (MAU/Ambulatory/Procedural Areas - Information given) No - Patient declined - -  Pre-existing out of facility DNR order (yellow form or pink MOST form) - - - No    Tobacco Social History   Tobacco Use  Smoking Status Former Smoker  . Types: Cigarettes  . Last attempt to quit: 09/14/2007  . Years since quitting: 10.7  Smokeless Tobacco Never Used  Tobacco Comment   quit 2009     Counseling given: Not Answered Comment: quit 2009   Clinical Intake:  Pre-visit preparation completed: Yes  Pain : No/denies pain     Diabetes: Yes(Followed by pcp)  How often do you need to have someone help you when you read instructions, pamphlets, or other written materials from your doctor or pharmacy?: 1 - Never  Interpreter Needed?: No     Past Medical History:  Diagnosis Date  . Angina   . Asthma   . Bursitis of left elbow 2018  . CAD (coronary artery disease) 04/12/2011   a. remote MI in 1996; b. MI 1997 s/p stenting to LAD & diag; c. MI 2009: long stenting from pLAD to mid LAD and diag & POBA of jailed diag branch; d. cath 2010: 80%  ISR of LAD w/ L-R collats, med Rx; e. cath 2012: 50-60% tub mLAD, 60-70% dLAD, FFR 0.75 -->s/p PCI dissection repaired w/ 2 stents (3 total); f. cath 06/2012: no changes from 2012 cath, med Rx, patent stents   . Diabetes mellitus without complication (HCC)   . ED (erectile dysfunction)   . Heart disease   . HTN (hypertension) 04/12/2011  . Hyperlipemia 04/12/2011  . Ischemic cardiomyopathy    a. echo 2010: EF 45-50%, mild to mod ant and apical wall HK, trace MR; b. cardiac cath 06/2012: EF 40%, mild MR   . Myocardial infarction (HCC)    x 5  . Obesity   . OSA (obstructive sleep apnea)    on CPAP  . Osteoarthritis   . Renal disorder    kidney stone   Past Surgical History:  Procedure Laterality Date  . CORONARY ANGIOPLASTY     x 5  . CORONARY STENT PLACEMENT     x 7  . LEFT HEART CATHETERIZATION WITH CORONARY ANGIOGRAM N/A 04/14/2011   Procedure: LEFT HEART CATHETERIZATION WITH CORONARY ANGIOGRAM;  Surgeon: Kenneth C. Hilty, MD;  Location: [facility]ATH [facility];  Service: Cardiovascular;  Laterality: N/A;  . LEFT HEART CATHETERIZATION WITH CORONARY ANGIOGRAM N/A 06/25/2012   Procedure: LEFT HEART CATHETERIZATION WITH CORONARY ANGIOGRAM;  Surgeon: Peter M Jordan, MD;  Location: [facility]ATH [facility];  Service:   Cardiovascular;  Laterality: N/A;  . TONSILLECTOMY AND ADENOIDECTOMY  1955   Family History  Problem Relation Age of Onset  . COPD Mother   . Other Father        muscle tumor  . Stroke Father   . Prostate cancer Father        mets  . Heart disease Father   . Alcoholism Father   . Stroke Sister   . Heart block Sister        Heart bypass  . Bladder Cancer Neg Hx   . Kidney cancer Neg Hx    Social History   Socioeconomic History  . Marital status: Married    Spouse name: Not on file  . Number of children: 2  . Years of education: Not on file  . Highest education level: Not on file  Occupational History  . Occupation: [employer]-retired  . Occupation: Sales  Social Needs  . Financial  resource strain: Not hard at all  . Food insecurity:    Worry: Never true    Inability: Never true  . Transportation needs:    Medical: No    Non-medical: No  Tobacco Use  . Smoking status: Former Smoker    Types: Cigarettes    Last attempt to quit: 09/14/2007    Years since quitting: 10.7  . Smokeless tobacco: Never Used  . Tobacco comment: quit 2009  Substance and Sexual Activity  . Alcohol use: No    Alcohol/week: 0.0 standard drinks    Comment: rarely 1 beer in last 5 years  . Drug use: No    Types: Marijuana    Comment: pt stopped fall 2012  . Sexual activity: Never  Lifestyle  . Physical activity:    Days per week: 0 days    Minutes per session: Not on file  . Stress: Not at all  Relationships  . Social connections:    Talks on phone: Not on file    Gets together: Not on file    Attends religious service: Not on file    Active member of club or organization: Not on file    Attends meetings of clubs or organizations: Not on file    Relationship status: Not on file  Other Topics Concern  . Not on file  Social History Narrative   Retired twice    Working with rescue dogs    Lives with girlfriend   Pet: 1 snake ([place] Bull snake), 3 dogs    Caffeine- coffee 3 cups a day, tea- unsweet     Outpatient Encounter Medications as of 06/03/2018  Medication Sig  . aspirin EC 81 MG tablet Take 1 tablet (81 mg total) by mouth daily.  . atorvastatin (LIPITOR) 80 MG tablet Take 1 tablet (80 mg total) by mouth daily.  . blood glucose meter kit and supplies KIT Dispense based on patient and insurance preference. Use once daily fasting. (FOR ICD 10 DIAGNOSIS OF E11.59).  . carvedilol (COREG) 6.25 MG tablet Take 1 tablet (6.25 mg total) by mouth 2 (two) times daily with a meal.  . diclofenac sodium (VOLTAREN) 1 % GEL Apply topically to affected area qid  . JANUVIA 100 MG tablet TAKE 1 TABLET DAILY  . JARDIANCE 25 MG TABS tablet TAKE 1 TABLET DAILY  . Lactobacillus (ACIDOPHILUS  PROBIOTIC) 100 MG CAPS Take 1 capsule (100 mg total) by mouth daily. (Patient taking differently: Take 100 mg by mouth daily as needed. )  . metFORMIN (GLUCOPHAGE) 1000 MG tablet Take   1 tablet (1,000 mg total) by mouth 2 (two) times daily with a meal.  . Multiple Vitamins-Minerals (MULTIVITAMINS THER. W/MINERALS) TABS Take 1 tablet by mouth every morning.   . niacin (NIASPAN) 1000 MG CR tablet TAKE 2 TABLETS (2000 MG TOTAL) AT BEDTIME  . nitroGLYCERIN (NITRODUR - DOSED IN MG/24 HR) 0.2 mg/hr patch Place 1/4 to 1/2 of a patch over affected region. Remove and replace once daily.  Slightly alter skin placement daily  . nitroGLYCERIN (NITROLINGUAL) 0.4 MG/SPRAY spray Place 1 spray under the tongue every 5 (five) minutes as needed for chest pain.  . prasugrel (EFFIENT) 10 MG TABS tablet Take 1 tablet (10 mg total) by mouth daily.  . ramipril (ALTACE) 2.5 MG capsule Take 1 capsule (2.5 mg total) by mouth every morning.  . ranolazine (RANEXA) 1000 MG SR tablet Take 1 tablet (1,000 mg total) by mouth 2 (two) times daily.  . sildenafil (VIAGRA) 100 MG tablet Take 1 tablet (100 mg total) by mouth daily as needed for erectile dysfunction.   No facility-administered encounter medications on file as of 06/03/2018.     Activities of Daily Living In your present state of health, do you have any difficulty performing the following activities: 06/03/2018  Hearing? N  Vision? N  Difficulty concentrating or making decisions? N  Walking or climbing stairs? N  Dressing or bathing? N  Doing errands, shopping? N  Preparing Food and eating ? N  Using the Toilet? N  In the past six months, have you accidently leaked urine? N  Do you have problems with loss of bowel control? N  Managing your Medications? N  Managing your Finances? N  Housekeeping or managing your Housekeeping? N  Some recent data might be hidden    Patient Care Team: Sonnenberg, Eric G, MD as PCP - General (Family Medicine) Gollan, Timothy J,  MD as Consulting Physician (Cardiology) Sonnenberg, Eric G, MD as Consulting Physician (Family Medicine) Sankar, Seeplaputhur G, MD (General Surgery)   Assessment:   This is a routine wellness examination for Santino.  Reports he does not monitor his blood sugar. States he has a glucometer at home and cannot recall how to use it.  Encouraged to bring it to next scheduled appointment or schedule nurse visit for teaching. He agrees with this plan.   Health Screenings  Colonoscopy -11/08/13 PSA -10/08/16 Glaucoma -none reported Hearing -demonstrates normal hearing during conversatioon Hemoglobin A1C -02/04/18 (8.9) Cholesterol -02/04/18  (116) Dental- care discussed Vision- care discussed  Social  Alcohol intake -no Smoking history- former Smokers in home? none Illicit drug use? None current Exercise -none. He plans to start core exercises. EMMI exercise information provided.  Diet -regular Sexually Active -never  Safety  Patient feels safe at home.  Patient does have smoke detectors at home  Patient does wear sunscreen or protective clothing when in direct sunlight  Patient does wear seat belt when driving or riding with others.   Activities of Daily Living Patient can do their own household chores. Denies needing assistance with: driving, feeding themselves, getting from bed to chair, getting to the toilet, bathing/showering, dressing, managing money, climbing flight of stairs, or preparing meals.   Depression Screen Patient denies losing interest in daily life, feeling hopeless, or crying easily over simple problems.   Fall Screen Patient denies being afraid of falling or falling in the last year.   Memory Screen Patient denies problems with memory, misplacing items, and is able to balance checkbook/bank accounts.  Patient is alert,   normal appearance, oriented to person/place/and time. Correctly identified the president of the [place], recall of 3/3 objects, and performing simple  calculations.  Patient displays appropriate judgement and can read correct time from watch face.   Immunizations The following Immunizations are up to date: Influenza, and pneumonia.  Shingrix and Tdap discussed.   Prevnar 13 administered R deltoid.  No grimacing or verbal complaint when given.  Tolerated well. Educational material provided.   Other Providers Patient Care Team: [name], MD as PCP - General (Family Medicine) Minna Merritts, MD as Consulting Physician (Cardiology) [name], MD as Consulting Physician (Family Medicine) Christene Lye, MD (General Surgery)  Exercise Activities and Dietary recommendations Current Exercise Habits: The patient does not participate in regular exercise at present  Goals    . DIET - REDUCE PORTION SIZE     Low carb foods    . DIET - REDUCE SUGAR INTAKE     Monitor blood sugars daily       Fall Risk Fall Risk  06/03/2018 12/14/2017 05/15/2016  Falls in the past year? 0 Yes No  Comment - Emmi Telephone Survey: data to providers prior to load -  Number falls in past yr: - 1 -  Comment - Emmi Telephone Survey Actual Response = 1 -  Injury with Fall? - Yes -   Depression Screen PHQ 2/9 Scores 06/03/2018 02/18/2018 05/15/2016 08/06/2015  PHQ - 2 Score 0 0 0 1    Cognitive Function MMSE - Mini Mental State Exam 06/03/2018  Orientation to time 5  Orientation to Place 5  Registration 3  Attention/ Calculation 5  Recall 3  Language- name 2 objects 2  Language- repeat 1  Language- follow 3 step command 3  Language- read & follow direction 1  Write a sentence 1  Copy design 1  Total score 30        Immunization History  Administered Date(s) Administered  . Influenza, High Dose Seasonal PF 02/07/2016, 01/26/2018  . Influenza,inj,Quad PF,6+ Mos 03/13/2014, 02/26/2015  . Pneumococcal Conjugate-13 06/03/2018   Screening Tests Health Maintenance  Topic Date Due  . OPHTHALMOLOGY EXAM  11/20/1960  . PNA  vac Low Risk Adult (1 of 2 - PCV13) 11/21/2015  . COLONOSCOPY  11/08/2016  . HEMOGLOBIN A1C  08/05/2018  . FOOT EXAM  01/27/2019  . TETANUS/TDAP  02/25/2021  . INFLUENZA VACCINE  Completed  . Hepatitis C Screening  Completed       Plan:    End of life planning; Advance aging; Advanced directives discussed. Copy of current HCPOA/Living Will requested.    Schedule nurse visit for use of glucometer or bring to next visit.  Schedule diabetic eye exam.   I have personally reviewed and noted the following in the patient's chart:   . Medical and social history . Use of alcohol, tobacco or illicit drugs  . Current medications and supplements . Functional ability and status . Nutritional status . Physical activity . Advanced directives . List of other physicians . Hospitalizations, surgeries, and ER visits in previous 12 months . Vitals . Screenings to include cognitive, depression, and falls . Referrals and appointments  In addition, I have reviewed and discussed with patient certain preventive protocols, quality metrics, and best practice recommendations. A written personalized care plan for preventive services as well as general preventive health recommendations were provided to patient.     Varney Biles, LPN  [id number]

## 2018-06-03 NOTE — Patient Instructions (Addendum)
  [name] , Thank you for taking time to come for your Medicare Wellness Visit. I appreciate your ongoing commitment to your health goals. Please review the following plan we discussed and let me know if I can assist you in the future.   Follow up as needed.    Bring a copy of your [place] and/or Living Will to be scanned into chart upon completion.  Schedule nurse visit for use of glucometer or bring to next visit.  Schedule diabetic eye exam.   Have a great day and stay warm!  These are the goals we discussed: Goals    . DIET - REDUCE PORTION SIZE     Low carb foods    . DIET - REDUCE SUGAR INTAKE     Monitor blood sugars daily       This is a list of the screening recommended for you and due dates:  Health Maintenance  Topic Date Due  . Eye exam for diabetics  11/20/1960  . Pneumonia vaccines (1 of 2 - PCV13) 11/21/2015  . Colon Cancer Screening  11/08/2016  . Hemoglobin A1C  08/05/2018  . Complete foot exam   01/27/2019  . Tetanus Vaccine  02/25/2021  . Flu Shot  Completed  .  Hepatitis C: One time screening is recommended by Center for Disease Control  (CDC) for  adults born from [birth year] through 1965.   Completed

## 2018-06-03 NOTE — Progress Notes (Signed)
Agree f/u with PCP   TMS 

## 2018-06-06 ENCOUNTER — Encounter: Payer: Self-pay | Admitting: Family Medicine

## 2018-06-07 NOTE — Telephone Encounter (Signed)
Sent to PCP as an FYI  

## 2018-06-15 ENCOUNTER — Encounter: Payer: Self-pay | Admitting: Sports Medicine

## 2018-07-26 ENCOUNTER — Ambulatory Visit: Payer: Medicare Other | Admitting: Family Medicine

## 2018-07-29 ENCOUNTER — Ambulatory Visit: Payer: Medicare Other | Admitting: Family Medicine

## 2018-08-26 ENCOUNTER — Other Ambulatory Visit: Payer: Self-pay | Admitting: Cardiovascular Disease

## 2018-08-26 DIAGNOSIS — I25119 Atherosclerotic heart disease of native coronary artery with unspecified angina pectoris: Secondary | ICD-10-CM

## 2018-11-30 DIAGNOSIS — H2510 Age-related nuclear cataract, unspecified eye: Secondary | ICD-10-CM | POA: Diagnosis not present

## 2018-12-24 ENCOUNTER — Other Ambulatory Visit: Payer: Self-pay | Admitting: Cardiovascular Disease

## 2018-12-25 ENCOUNTER — Encounter: Payer: Self-pay | Admitting: Family Medicine

## 2018-12-27 ENCOUNTER — Other Ambulatory Visit: Payer: Self-pay

## 2018-12-27 ENCOUNTER — Encounter: Payer: Self-pay | Admitting: Podiatry

## 2018-12-27 ENCOUNTER — Ambulatory Visit (INDEPENDENT_AMBULATORY_CARE_PROVIDER_SITE_OTHER): Payer: Medicare Other | Admitting: Podiatry

## 2018-12-27 DIAGNOSIS — M278 Other specified diseases of jaws: Secondary | ICD-10-CM | POA: Insufficient documentation

## 2018-12-27 DIAGNOSIS — L600 Ingrowing nail: Secondary | ICD-10-CM | POA: Diagnosis not present

## 2018-12-27 DIAGNOSIS — I25119 Atherosclerotic heart disease of native coronary artery with unspecified angina pectoris: Secondary | ICD-10-CM | POA: Diagnosis not present

## 2018-12-27 NOTE — Progress Notes (Signed)
This patient returns to the office following nail surgery in November.  He says he has a small piece of nail growing from the site of his surgery.  He says there is no pain associated with the nail.  He presents to the office for evaluation of this condition.  GENERAL APPEARANCE: Alert, conversant. Appropriately groomed. No acute distress.  VASCULAR: Pedal pulses palpable at  [facility] and PT bilateral.  Capillary refill time is immediate to all digits,  Normal temperature gradient.    NEUROLOGIC: sensation is normal to 5.07 monofilament at 5/5 sites bilateral.  Light touch is intact bilateral, Muscle strength normal.  MUSCULOSKELETAL: acceptable muscle strength, tone and stability bilateral.  Intrinsic muscluature intact bilateral.  Rectus appearance of foot and digits noted bilateral.   DERMATOLOGIC: skin color, texture, and turgor are within normal limits.  No preulcerative lesions or ulcers  are seen, no interdigital maceration noted.   NAILS  No evidence of nail along the medial border left hallux toenail.  No evidence of nail spicule.  DX  Nail spicule left hallux.  ROV discussed this condition with this patient.  Told him the surgery was very successful and that he has developed callus at the base of the nail border.  No evidence of a nail spicule is noted and he says he may have cut the nail himself.  Told him this is not a serious matter and he could trim the nail if he desires or he could allow the nail to regrow and then I could perform the same procedure for the elimination of the nail spicule in the future  RTC prn.   Gardiner Barefoot DPM

## 2018-12-30 ENCOUNTER — Telehealth: Payer: Self-pay

## 2018-12-30 ENCOUNTER — Other Ambulatory Visit: Payer: Self-pay | Admitting: Family Medicine

## 2019-01-04 ENCOUNTER — Telehealth: Payer: Self-pay

## 2019-01-04 NOTE — Telephone Encounter (Signed)
Called patient to schedule an appointment.  No answer.  Unable to leave a message since voicemail box was full.

## 2019-03-29 ENCOUNTER — Other Ambulatory Visit: Payer: Self-pay

## 2019-03-29 DIAGNOSIS — Z20828 Contact with and (suspected) exposure to other viral communicable diseases: Secondary | ICD-10-CM | POA: Diagnosis not present

## 2019-03-29 DIAGNOSIS — Z20822 Contact with and (suspected) exposure to covid-19: Secondary | ICD-10-CM

## 2019-03-31 ENCOUNTER — Encounter: Payer: Self-pay | Admitting: Family Medicine

## 2019-03-31 LAB — NOVEL CORONAVIRUS, NAA: SARS-CoV-2, NAA: NOT DETECTED

## 2019-03-31 NOTE — Telephone Encounter (Signed)
Please call pt and let him know that Dr Caryl Bis is not in the office.  Also, need to know when symptoms began in reference to when he was tested.  With symptoms and questions, sounds like he needs doxy appt.

## 2019-03-31 NOTE — Telephone Encounter (Signed)
LMTCB

## 2019-04-01 NOTE — Telephone Encounter (Signed)
Called patient and discussed the different swabs. Advised Dr Caryl Bis is out of office. Pt is quarantining with wife. He is going to call or send my chart message with update next week to determine if appt is needed to discuss retesting.

## 2019-04-04 ENCOUNTER — Other Ambulatory Visit: Payer: Self-pay | Admitting: Cardiovascular Disease

## 2019-04-04 DIAGNOSIS — I25119 Atherosclerotic heart disease of native coronary artery with unspecified angina pectoris: Secondary | ICD-10-CM

## 2019-04-05 NOTE — Telephone Encounter (Signed)
Please send in refills while patient is waiting to recover

## 2019-04-05 NOTE — Telephone Encounter (Signed)
Positive for covid will call back when ready to reschedule.

## 2019-04-05 NOTE — Telephone Encounter (Signed)
Please schedule F/U appointment with Dr. Gollan. Thank you! 

## 2019-04-15 NOTE — Telephone Encounter (Signed)
I left a mychart message stating: Hello [name] am Gae Bon, I am Dr. Ellen Henri medical assistant In my chart you and the provider had a conversation in about your back pain and he advised that you make an appointment I called and left a message for you on your phone, if still needed please call [phone] to schedule a appointment.  Thanks  Markia Kyer,cma

## 2019-04-21 ENCOUNTER — Telehealth: Payer: Self-pay | Admitting: Cardiovascular Disease

## 2019-04-21 NOTE — Telephone Encounter (Signed)
Patient states he has been experiencing CP for the last couple weeks.  Pt c/o of Chest Pain: STAT if CP now or developed within 24 hours  1. Are you having CP right now? A "little pain"  2. Are you experiencing any other symptoms (ex. SOB, nausea, vomiting, sweating)? No( patient was positive for COVID about 4 weeks ago)  3. How long have you been experiencing CP? For c couple weeks  4. Is your CP continuous or coming and going? Comes and stays and then fades  5. Have you taken Nitroglycerin? no ?

## 2019-04-21 NOTE — Telephone Encounter (Signed)
Call to patient to discuss intermittent chest discomfort over the past 4 weeks. He believes that he had covid bc wife tested positive and he tested negative at the same time and sx were identical.   He has lingering morning cough and SOB is improving each day. "discomfort" in anterior chest "lower half of sternum". Twinges of pain last a few seconds while he was doing a puzzle.   Pt has an appt on Monday with Dr. Rockey Situ. I told patient if he had new or worsening sx that he should be seen in the ED. Pt will come in for appt Monday unless advised otherwise per MD advise.   Routing to Dr. Rockey Situ.
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
## 2019-04-25 ENCOUNTER — Encounter: Payer: Self-pay | Admitting: Cardiovascular Disease

## 2019-04-25 ENCOUNTER — Ambulatory Visit (INDEPENDENT_AMBULATORY_CARE_PROVIDER_SITE_OTHER): Payer: Medicare Other | Admitting: Cardiovascular Disease

## 2019-04-25 ENCOUNTER — Other Ambulatory Visit: Payer: Self-pay

## 2019-04-25 VITALS — BP 130/80 | HR 87 | Ht 68.0 in | Wt 195.5 lb

## 2019-04-25 DIAGNOSIS — I10 Essential (primary) hypertension: Secondary | ICD-10-CM | POA: Diagnosis not present

## 2019-04-25 DIAGNOSIS — I255 Ischemic cardiomyopathy: Secondary | ICD-10-CM

## 2019-04-25 DIAGNOSIS — I25118 Atherosclerotic heart disease of native coronary artery with other forms of angina pectoris: Secondary | ICD-10-CM | POA: Diagnosis not present

## 2019-04-25 DIAGNOSIS — G4733 Obstructive sleep apnea (adult) (pediatric): Secondary | ICD-10-CM

## 2019-04-25 DIAGNOSIS — E7800 Pure hypercholesterolemia, unspecified: Secondary | ICD-10-CM | POA: Diagnosis not present

## 2019-04-25 NOTE — Patient Instructions (Signed)

## 2019-06-06 ENCOUNTER — Other Ambulatory Visit: Payer: Self-pay

## 2019-06-06 ENCOUNTER — Ambulatory Visit (INDEPENDENT_AMBULATORY_CARE_PROVIDER_SITE_OTHER): Payer: Medicare Other

## 2019-06-06 ENCOUNTER — Ambulatory Visit: Payer: Medicare Other | Admitting: Family Medicine

## 2019-06-06 VITALS — Ht 68.0 in | Wt 195.0 lb

## 2019-06-06 DIAGNOSIS — Z0000 Encounter for general adult medical examination without abnormal findings: Secondary | ICD-10-CM

## 2019-06-06 NOTE — Patient Instructions (Addendum)
  [name] , Thank you for taking time to come for your Medicare Wellness Visit. I appreciate your ongoing commitment to your health goals. Please review the following plan we discussed and let me know if I can assist you in the future.   These are the goals we discussed: Goals      Patient Stated   . DIET - REDUCE PORTION SIZE (pt-stated)     Low carb foods Lose weight       This is a list of the screening recommended for you and due dates:  Health Maintenance  Topic Date Due  . Hemoglobin A1C  08/05/2018  . Pneumonia vaccines (2 of 2 - PPSV23) 06/04/2019  . Colon Cancer Screening  07/11/2019*  . Flu Shot  08/10/2019*  . Eye exam for diabetics  11/24/2019  . Complete foot exam   12/25/2019  . Tetanus Vaccine  02/25/2021  .  Hepatitis C: One time screening is recommended by Center for Disease Control  (CDC) for  adults born from [birth year] through 1965.   Completed  *Topic was postponed. The date shown is not the original due date.

## 2019-06-06 NOTE — Progress Notes (Addendum)
Subjective:   [name] is a 69 y.o. male who presents for Medicare Annual/Subsequent preventive examination.  Review of Systems:  No ROS.  Medicare Wellness Virtual Visit.  Visual/audio telehealth visit, UTA vital signs.   Ht/Wt provided.  See social history for additional risk factors.   Cardiac Risk Factors include: advanced age (>107mn, >>91women);hypertension;diabetes mellitus;male gender     Objective:    Vitals: Ht 5' 8" (1.727 m)   Wt 195 lb (88.5 kg)   BMI 29.65 kg/m   Body mass index is 29.65 kg/m.  Advanced Directives 06/06/2019 06/03/2018 05/22/2018 04/22/2015 04/12/2011  Does Patient Have a Medical Advance Directive? No No No No Patient does not have advance directive  Would patient like information on creating a medical advance directive? Yes (MAU/Ambulatory/Procedural Areas - Information given) Yes (MAU/Ambulatory/Procedural Areas - Information given) No - Patient declined - -  Pre-existing out of facility DNR order (yellow form or pink MOST form) - - - - No    Tobacco Social History   Tobacco Use  Smoking Status Former Smoker  . Types: Cigarettes  . Quit date: 09/14/2007  . Years since quitting: 11.7  Smokeless Tobacco Never Used  Tobacco Comment   quit 2009     Counseling given: Not Answered Comment: quit 2009   Clinical Intake:  Pre-visit preparation completed: Yes        Diabetes: Yes  How often do you need to have someone help you when you read instructions, pamphlets, or other written materials from your doctor or pharmacy?: 1 - Never  Interpreter Needed?: No     Past Medical History:  Diagnosis Date  . Angina   . Asthma   . Bursitis of left elbow 2018  . CAD (coronary artery disease) 04/12/2011   a. remote MI in 1996; b. MI 1997 s/p stenting to LAD & diag; c. MI 2009: long stenting from pLAD to mid LAD and diag & POBA of jailed diag branch; d. cath 2010: 80% ISR of LAD w/ L-R collats, med Rx; e. cath 2012: 50-60% tub mLAD,  60-70% dLAD, FFR 0.75 -->s/p PCI dissection repaired w/ 2 stents (3 total); f. cath 06/2012: no changes from 2012 cath, med Rx, patent stents   . Diabetes mellitus without complication ([facility]   . ED (erectile dysfunction)   . Heart disease   . HTN (hypertension) 04/12/2011  . Hyperlipemia 04/12/2011  . Ischemic cardiomyopathy    a. echo 2010: EF 45-50%, mild to mod ant and apical wall HK, trace MR; b. cardiac cath 06/2012: EF 40%, mild MR   . [place]    x 5  . Obesity   . OSA (obstructive sleep apnea)    on CPAP  . Osteoarthritis   . Renal disorder    kidney stone   Past Surgical History:  Procedure Laterality Date  . CORONARY ANGIOPLASTY     x 5  . CORONARY STENT PLACEMENT     x 7  . LEFT HEART CATHETERIZATION WITH CORONARY ANGIOGRAM N/A 04/14/2011   Procedure: LEFT HEART CATHETERIZATION WITH CORONARY ANGIOGRAM;  Surgeon: KPixie Casino MD;  [facility];  Service: Cardiovascular;  Laterality: N/A;  . LEFT HEART CATHETERIZATION WITH CORONARY ANGIOGRAM N/A 06/25/2012   Procedure: LEFT HEART CATHETERIZATION WITH CORONARY ANGIOGRAM;  Surgeon: Peter M JMartinique MD;  [facility];  Service: Cardiovascular;  Laterality: N/A;  . TONSILLECTOMY AND ADENOIDECTOMY  1955   Family History  Problem Relation Age of Onset  .  COPD Mother   . Other Father        muscle tumor  . Stroke Father   . Prostate cancer Father        mets  . Heart disease Father   . Alcoholism Father   . Stroke Sister   . Heart block Sister        Heart bypass  . Bladder Cancer Neg Hx   . Kidney cancer Neg Hx    Social History   Socioeconomic History  . Marital status: Married    Spouse name: Not on file  . Number of children: 2  . Years of education: Not on file  . Highest education level: Not on file  Occupational History  . Occupation: [employer]-retired  . Occupation: Sales  Tobacco Use  . Smoking status: Former Smoker    Types: Cigarettes    Quit date: 09/14/2007     Years since quitting: 11.7  . Smokeless tobacco: Never Used  . Tobacco comment: quit 2009  Substance and Sexual Activity  . Alcohol use: No    Alcohol/week: 0.0 standard drinks    Comment: rarely 1 beer in last 5 years  . Drug use: No    Types: Marijuana    Comment: pt stopped fall 2012  . Sexual activity: Never  Other Topics Concern  . Not on file  Social History Narrative   Retired twice    Working with rescue dogs    Lives with girlfriend   Pet: 1 snake ([place] snake), 3 dogs    Caffeine- coffee 3 cups a day, tea- unsweet    Social Determinants of Health   Financial Resource Strain:   . Difficulty of Paying Living Expenses: Not on file  Food Insecurity:   . Worried About Charity fundraiser in the Last Year: Not on file  . Ran Out of Food in the Last Year: Not on file  Transportation Needs:   . Lack of Transportation (Medical): Not on file  . Lack of Transportation (Non-Medical): Not on file  Physical Activity:   . Days of Exercise per Week: Not on file  . Minutes of Exercise per Session: Not on file  Stress:   . Feeling of Stress : Not on file  Social Connections:   . Frequency of Communication with Friends and Family: Not on file  . Frequency of Social Gatherings with Friends and Family: Not on file  . Attends Religious Services: Not on file  . Active Member of Clubs or Organizations: Not on file  . Attends Archivist Meetings: Not on file  . Marital Status: Not on file    Outpatient Encounter Medications as of 06/06/2019  Medication Sig  . aspirin EC 81 MG tablet Take 1 tablet (81 mg total) by mouth daily.  Marland Kitchen atorvastatin (LIPITOR) 80 MG tablet TAKE 1 TABLET DAILY  . blood glucose meter kit and supplies KIT Dispense based on patient and insurance preference. Use once daily fasting. (FOR ICD 10 DIAGNOSIS OF E11.59).  . carvedilol (COREG) 6.25 MG tablet Take 1 tablet (6.25 mg total) by mouth 2 (two) times daily with a meal.  . diclofenac sodium  (VOLTAREN) 1 % GEL Apply topically to affected area qid  . JANUVIA 100 MG tablet TAKE 1 TABLET DAILY  . JARDIANCE 25 MG TABS tablet TAKE 1 TABLET DAILY  . Lactobacillus (ACIDOPHILUS PROBIOTIC) 100 MG CAPS Take 1 capsule (100 mg total) by mouth daily. (Patient taking differently: Take 100 mg by mouth  daily as needed. )  . metFORMIN (GLUCOPHAGE) 1000 MG tablet TAKE 1 TABLET TWICE A DAY WITH MEALS  . Multiple Vitamins-Minerals (MULTIVITAMINS THER. W/MINERALS) TABS Take 1 tablet by mouth every morning.   . niacin (NIASPAN) 1000 MG CR tablet TAKE 2 TABLETS (2000 MG TOTAL) AT BEDTIME  . nitroGLYCERIN (NITRODUR - DOSED IN MG/24 HR) 0.2 mg/hr patch Place 1/4 to 1/2 of a patch over affected region. Remove and replace once daily.  Slightly alter skin placement daily  . nitroGLYCERIN (NITROLINGUAL) 0.4 MG/SPRAY spray Place 1 spray under the tongue every 5 (five) minutes as needed for chest pain.  . prasugrel (EFFIENT) 10 MG TABS tablet Take 1 tablet (10 mg total) by mouth daily.  . ramipril (ALTACE) 2.5 MG capsule Take 1 capsule (2.5 mg total) by mouth every morning.  . ranolazine (RANEXA) 1000 MG SR tablet Take 1 tablet (1,000 mg total) by mouth 2 (two) times daily.  . sildenafil (VIAGRA) 100 MG tablet Take 1 tablet (100 mg total) by mouth daily as needed for erectile dysfunction.   No facility-administered encounter medications on file as of 06/06/2019.    Activities of Daily Living In your present state of health, do you have any difficulty performing the following activities: 06/06/2019  Hearing? N  Vision? N  Difficulty concentrating or making decisions? N  Walking or climbing stairs? N  Dressing or bathing? N  Doing errands, shopping? N  Preparing Food and eating ? N  Using the Toilet? N  In the past six months, have you accidently leaked urine? N  Do you have problems with loss of bowel control? N  Managing your Medications? N  Managing your Finances? N  Housekeeping or managing your  Housekeeping? N  Some recent data might be hidden    Patient Care Team: [name], MD as PCP - General (Family Medicine) Rockey Situ, Kathlene November, MD as Consulting Physician (Cardiology) [name], MD as Consulting Physician (Family Medicine) Christene Lye, MD (General Surgery)   Assessment:   This is a routine wellness examination for [name].  Nurse connected with patient 06/06/19 at  8:30 AM EST by a telephone enabled telemedicine application and verified that I am speaking with the correct person using two identifiers. Patient stated full name and DOB. Patient gave permission to continue with virtual visit. Patient's location was at home and Nurse's location was at [place] office.   Patient is alert and oriented x3. Patient denies difficulty focusing or concentrating. Patient likes to play sodoku, solitaire, video games and complete jigsaw  puzzles for brain stimulation.   Health Maintenance Due: -Colonoscopy- deferred for follow up with pcp per patient request.  -Foot Exam- followed by [facility], Dr. Tressa Busman -Hgb A1c- 02/04/18 (8.9) See completed HM at the end of note.   Eye: Visual acuity not assessed. Virtual visit. Followed by their ophthalmologist.  Retinopathy- none reported.  Dental: UTD  Hearing: Demonstrates normal hearing during visit.  Safety:  Patient feels safe at home- yes Patient does have smoke detectors at home- yes Patient does wear sunscreen or protective clothing when in direct sunlight - yes Patient does wear seat belt when in a moving vehicle - yes Patient drives- yes Adequate lighting in walkways free from debris- yes Grab bars and handrails used as appropriate- yes Ambulates with an assistive device- no Cell phone on person when ambulating outside of the home- yes  Social: Alcohol intake - no  Smoking history- former   Smokers in home?  none Illicit drug use? none  Medication: Taking as directed and without issues.   Pill box in use -yes  Self managed - yes   Covid-19: Precautions and sickness symptoms discussed. Wears mask, social distancing, hand hygiene as appropriate.   Activities of Daily Living Patient denies needing assistance with: household chores, feeding themselves, getting from bed to chair, getting to the toilet, bathing/showering, dressing, managing money, or preparing meals.   Discussed the importance of a healthy diet, water intake and the benefits of aerobic exercise.  Physical activity- active around the house, no routine.   Diet:  Regular Water: good intake; up to 120 ounces Caffeine: 3 cups daily  Other Providers Patient Care Team: [name], MD as PCP - General (Family Medicine) Rockey Situ Kathlene November, MD as Consulting Physician (Cardiology) [name], MD as Consulting Physician (Family Medicine) Christene Lye, MD (General Surgery)  Exercise Activities and Dietary recommendations Current Exercise Habits: Home exercise routine  Goals      Patient Stated   . DIET - REDUCE PORTION SIZE (pt-stated)     Low carb foods Lose weight       Fall Risk Fall Risk  06/06/2019 06/03/2018 12/14/2017 05/15/2016  Falls in the past year? 0 0 Yes No  Comment - - Emmi Telephone Survey: data to providers prior to load -  Number falls in past yr: - - 1 -  Comment - - Emmi Telephone Survey Actual Response = 1 -  Injury with Fall? - - Yes -  Follow up Falls evaluation completed - - -    Timed Get Up and Go Performed: no, virtual visit  Depression Screen PHQ 2/9 Scores 06/06/2019 06/03/2018 02/18/2018 05/15/2016  PHQ - 2 Score 0 0 0 0    Cognitive Function MMSE - Mini Mental State Exam 06/03/2018  Orientation to time 5  Orientation to Place 5  Registration 3  Attention/ Calculation 5  Recall 3  Language- name 2 objects 2  Language- repeat 1  Language- follow 3 step command 3  Language- read & follow direction 1  Write a sentence 1  Copy design 1  Total  score 30     6CIT Screen 06/06/2019  What Year? 0 points  What month? 0 points  What time? 0 points  Count back from 20 0 points  Months in reverse 0 points  Repeat phrase 0 points  Total Score 0    Immunization History  Administered Date(s) Administered  . Influenza, High Dose Seasonal PF 02/07/2016, 01/26/2018  . Influenza,inj,Quad PF,6+ Mos 03/13/2014, 02/26/2015  . Pneumococcal Conjugate-13 06/03/2018   Screening Tests Health Maintenance  Topic Date Due  . HEMOGLOBIN A1C  08/05/2018  . PNA vac Low Risk Adult (2 of 2 - PPSV23) 06/04/2019  . COLONOSCOPY  07/11/2019 (Originally 11/08/2016)  . INFLUENZA VACCINE  08/10/2019 (Originally 12/11/2018)  . OPHTHALMOLOGY EXAM  11/24/2019  . FOOT EXAM  12/25/2019  . TETANUS/TDAP  02/25/2021  . Hepatitis C Screening  Completed       Plan:   Keep all routine maintenance appointments.   Follow up with your doctor 07/11/19 @ 1:45  Medicare Attestation I have personally reviewed: The patient's medical and social history Their use of alcohol, tobacco or illicit drugs Their current medications and supplements The patient's functional ability including ADLs,fall risks, home safety risks, cognitive, and hearing and visual impairment Diet and physical activities Evidence for depression   I have reviewed and discussed with patient certain preventive protocols, quality metrics,  and best practice recommendations.   Varney Biles, LPN  [id number]   Reviewed above information.  Agree with assessment and plan.    Dr Nicki Reaper

## 2019-06-12 ENCOUNTER — Other Ambulatory Visit: Payer: Self-pay

## 2019-06-12 ENCOUNTER — Emergency Department
Admission: EM | Admit: 2019-06-12 | Discharge: 2019-06-12 | Disposition: A | Discharge status: Home / Self Care | Payer: Medicare Other | Attending: Emergency Medicine | Admitting: Emergency Medicine

## 2019-06-12 ENCOUNTER — Encounter: Payer: Self-pay | Admitting: Emergency Medicine

## 2019-06-12 DIAGNOSIS — E119 Type 2 diabetes mellitus without complications: Secondary | ICD-10-CM | POA: Insufficient documentation

## 2019-06-12 DIAGNOSIS — Z7984 Long term (current) use of oral hypoglycemic drugs: Secondary | ICD-10-CM | POA: Insufficient documentation

## 2019-06-12 DIAGNOSIS — Y999 Unspecified external cause status: Secondary | ICD-10-CM | POA: Diagnosis not present

## 2019-06-12 DIAGNOSIS — Y93E8 Activity, other personal hygiene: Secondary | ICD-10-CM | POA: Insufficient documentation

## 2019-06-12 DIAGNOSIS — H9222 Otorrhagia, left ear: Secondary | ICD-10-CM | POA: Diagnosis not present

## 2019-06-12 DIAGNOSIS — X58XXXA Exposure to other specified factors, initial encounter: Secondary | ICD-10-CM | POA: Insufficient documentation

## 2019-06-12 DIAGNOSIS — S0991XA Unspecified injury of ear, initial encounter: Secondary | ICD-10-CM

## 2019-06-12 DIAGNOSIS — Y929 Unspecified place or not applicable: Secondary | ICD-10-CM | POA: Insufficient documentation

## 2019-06-12 DIAGNOSIS — S00402A Unspecified superficial injury of left ear, initial encounter: Secondary | ICD-10-CM | POA: Diagnosis not present

## 2019-06-12 MED ORDER — CARBAMIDE PEROXIDE 6.5 % OT SOLN: 5.0000 [drp] | Freq: Two times a day (BID) | OTIC | 2 refills | Status: DC

## 2019-06-12 NOTE — ED Triage Notes (Signed)
Patient states that he has had some pain and small amount of bleeding to left ear that started about 16:00 today.

## 2019-06-12 NOTE — ED Notes (Signed)
Pt states left ear pain with some blood and lessened hearing.

## 2019-06-12 NOTE — ED Notes (Signed)
First RN note: pt states he has blood coming from ear canal and hearing loss in that ear. Pt in no acute distress.

## 2019-06-12 NOTE — ED Provider Notes (Signed)
[facility] Emergency Department Provider Note  ____________________________________________  Time seen: Approximately 8:31 PM  I have reviewed the triage vital signs and the nursing notes.   HISTORY  Chief Complaint Otalgia    HPI [name] is a 69 y.o. male who presents the emergency department concern for possible ear injury.  Patient was using a Q-tip today to clean earwax out of his left ear.  He removed the Q-tip, small dried blood.  Patient gently inserted the Q-tip again and noticed blood soaking the cotton portion of the Q-tip.  Patient has had no bloody drainage.  He denies any ear pain.  No hearing changes.  Patient does have a history of ruptured traumatic eardrum as a teenager.         Past Medical History:  Diagnosis Date  . Angina   . Asthma   . Bursitis of left elbow 2018  . CAD (coronary artery disease) 04/12/2011   a. remote MI in 1996; b. MI 1997 s/p stenting to LAD & diag; c. MI 2009: long stenting from pLAD to mid LAD and diag & POBA of jailed diag branch; d. cath 2010: 80% ISR of LAD w/ L-R collats, med Rx; e. cath 2012: 50-60% tub mLAD, 60-70% dLAD, FFR 0.75 -->s/p PCI dissection repaired w/ 2 stents (3 total); f. cath 06/2012: no changes from 2012 cath, med Rx, patent stents   . Diabetes mellitus without complication ([place])   . ED (erectile dysfunction)   . Heart disease   . HTN (hypertension) 04/12/2011  . Hyperlipemia 04/12/2011  . Ischemic cardiomyopathy    a. echo 2010: EF 45-50%, mild to mod ant and apical wall HK, trace MR; b. cardiac cath 06/2012: EF 40%, mild MR   . Myocardial infarction ([place])    x 5  . Obesity   . OSA (obstructive sleep apnea)    on CPAP  . Osteoarthritis   . Renal disorder    kidney stone    Patient Active Problem List   Diagnosis Date Noted  . Bone spicules of jaw 12/27/2018  . Ingrowing nail 12/27/2018  . Insomnia 02/18/2018  . Ingrown toenail 01/26/2018  . Adrenal adenoma 03/03/2017   . Right shoulder pain 03/03/2017  . History of colon polyps 12/01/2016  . Olecranon bursitis of left elbow 12/01/2016  . OSA (obstructive sleep apnea) 07/30/2016  . Low back pain 05/15/2016  . Actinic keratoses 02/07/2016  . Right hip pain 02/07/2016  . Ischemic cardiomyopathy   . Dysphagia 04/22/2015  . Diabetes mellitus type 2, uncontrolled, without complications 16/02/9603  . Sleep disturbance 08/08/2013  . Angina at rest [facility]) 04/12/2011  . CAD (coronary artery disease) 04/12/2011  . HTN (hypertension) 04/12/2011  . Hyperlipemia 04/12/2011    Past Surgical History:  Procedure Laterality Date  . CORONARY ANGIOPLASTY     x 5  . CORONARY STENT PLACEMENT     x 7  . LEFT HEART CATHETERIZATION WITH CORONARY ANGIOGRAM N/A 04/14/2011   Procedure: LEFT HEART CATHETERIZATION WITH CORONARY ANGIOGRAM;  Surgeon: Pixie Casino, MD;  Location: [facility] CATH LAB;  Service: Cardiovascular;  Laterality: N/A;  . LEFT HEART CATHETERIZATION WITH CORONARY ANGIOGRAM N/A 06/25/2012   Procedure: LEFT HEART CATHETERIZATION WITH CORONARY ANGIOGRAM;  Surgeon: Peter M Martinique, MD;  Location: [facility] CATH LAB;  Service: Cardiovascular;  Laterality: N/A;  . [place]    Prior to Admission medications   Medication Sig Start Date End Date Taking? Authorizing Provider  aspirin EC  81 MG tablet Take 1 tablet (81 mg total) by mouth daily. 10/02/16   Rise Mu, PA-C  atorvastatin (LIPITOR) 80 MG tablet TAKE 1 TABLET DAILY 04/05/19   Minna Merritts, MD  blood glucose meter kit and supplies KIT Dispense based on patient and insurance preference. Use once daily fasting. (FOR ICD 10 DIAGNOSIS OF E11.59). 05/15/16   [name], MD  carbamide peroxide (DEBROX) 6.5 % OTIC solution Place 5 drops into the left ear 2 (two) times daily. 06/12/19 06/11/20  Elsie Sakuma, Charline Bills, PA-C  carvedilol (COREG) 6.25 MG tablet Take 1 tablet (6.25 mg total) by mouth 2 (two) times daily with a meal. 04/02/18    Gollan, Kathlene November, MD  diclofenac sodium (VOLTAREN) 1 % GEL Apply topically to affected area qid 02/02/18   Gerda Diss, DO  JANUVIA 100 MG tablet TAKE 1 TABLET DAILY 02/02/18   [name], MD  JARDIANCE 25 MG TABS tablet TAKE 1 TABLET DAILY 05/03/18   [name], MD  Lactobacillus (ACIDOPHILUS PROBIOTIC) 100 MG CAPS Take 1 capsule (100 mg total) by mouth daily. Patient taking differently: Take 100 mg by mouth daily as needed.  04/23/15   Bettey Costa, MD  metFORMIN (GLUCOPHAGE) 1000 MG tablet TAKE 1 TABLET TWICE A DAY WITH MEALS 12/30/18   [name], MD  Multiple Vitamins-Minerals (MULTIVITAMINS THER. W/MINERALS) TABS Take 1 tablet by mouth every morning.     [provider]  niacin (NIASPAN) 1000 MG CR tablet TAKE 2 TABLETS (2000 MG TOTAL) AT BEDTIME 04/05/19   Gollan, Kathlene November, MD  nitroGLYCERIN (NITRODUR - DOSED IN MG/24 HR) 0.2 mg/hr patch Place 1/4 to 1/2 of a patch over affected region. Remove and replace once daily.  Slightly alter skin placement daily 04/13/18   Gerda Diss, DO  nitroGLYCERIN (NITROLINGUAL) 0.4 MG/SPRAY spray Place 1 spray under the tongue every 5 (five) minutes as needed for chest pain. 11/17/14   Rubbie Battiest, RN  prasugrel (EFFIENT) 10 MG TABS tablet Take 1 tablet (10 mg total) by mouth daily. 04/02/18   Wellington Hampshire, MD  ramipril (ALTACE) 2.5 MG capsule Take 1 capsule (2.5 mg total) by mouth every morning. 04/22/18   Minna Merritts, MD  ranolazine (RANEXA) 1000 MG SR tablet Take 1 tablet (1,000 mg total) by mouth 2 (two) times daily. 04/02/18   Wellington Hampshire, MD  sildenafil (VIAGRA) 100 MG tablet Take 1 tablet (100 mg total) by mouth daily as needed for erectile dysfunction. 03/14/13   Minna Merritts, MD    Allergies Atarax [hydroxyzine hcl], Hydroxyzine hcl, and Latex  Family History  Problem Relation Age of Onset  . COPD Mother   . Other Father        muscle tumor  . Stroke Father   . Prostate cancer  Father        mets  . Heart disease Father   . Alcoholism Father   . Stroke Sister   . Heart block Sister        Heart bypass  . Bladder Cancer Neg Hx   . Kidney cancer Neg Hx     Social History Social History   Tobacco Use  . Smoking status: Former Smoker    Types: Cigarettes    Quit date: 09/14/2007    Years since quitting: 11.7  . Smokeless tobacco: Never Used  . Tobacco comment: quit 2009  Substance Use Topics  . Alcohol use: No  Alcohol/week: 0.0 standard drinks    Comment: rarely 1 beer in last 5 years  . Drug use: No    Types: Marijuana    Comment: pt stopped fall 2012     Review of Systems  Constitutional: No fever/chills Eyes: No visual changes. No discharge ENT: Possible left ear injury. Cardiovascular: no chest pain. Respiratory: no cough. No SOB. Gastrointestinal: No abdominal pain.  No nausea, no vomiting.  Musculoskeletal: Negative for musculoskeletal pain. Skin: Negative for rash, abrasions, lacerations, ecchymosis. Neurological: Negative for headaches, focal weakness or numbness. 10-point ROS otherwise negative.  ____________________________________________   PHYSICAL EXAM:  VITAL SIGNS: ED Triage Vitals  Enc Vitals Group     BP 06/12/19 1928 (!) 150/77     Pulse Rate 06/12/19 1928 92     Resp 06/12/19 1928 18     Temp 06/12/19 1928 98.2 F (36.8 C)     Temp Source 06/12/19 1928 Oral     SpO2 06/12/19 1928 97 %     Weight 06/12/19 1926 200 lb (90.7 kg)     Height 06/12/19 1926 _0  (1.727 m)     Head Circumference --      Peak Flow --      Pain Score 06/12/19 1926 1     Pain Loc --      Pain Edu? --      Excl. in [place]? --      Constitutional: Alert and oriented. Well appearing and in no acute distress. Eyes: Conjunctivae are normal. PERRL. EOMI. Head: Atraumatic. ENT:      Ears: Visualization of the EAC and TM on right side is unremarkable.  Visualization of the left-sided EAC reveals superficial injury to the 6 o'clock position  of the EAC.  No evidence of traumatic TM rupture.  Patient does have small amount of cerumen in the 130 3 o'clock position of the EAC.  This is nonobstructing the TM however.  No active bleeding.  Congealed blood noted in the 6 o'clock position of the EAC.  This is distal to superficial injury.  No foreign body.  No erythema, bulging of the TM.      Nose: No congestion/rhinnorhea.      Mouth/Throat: Mucous membranes are moist.  Neck: No stridor.    Cardiovascular: Normal rate, regular rhythm. Normal S1 and S2.  Good peripheral circulation. Respiratory: Normal respiratory effort without tachypnea or retractions. Lungs CTAB. Good air entry to the bases with no decreased or absent breath sounds. Musculoskeletal: Full range of motion to all extremities. No gross deformities appreciated. Neurologic:  Normal speech and language. No gross focal neurologic deficits are appreciated.  Skin:  Skin is warm, dry and intact. No rash noted. Psychiatric: Mood and affect are normal. Speech and behavior are normal. Patient exhibits appropriate insight and judgement.   ____________________________________________   LABS (all labs ordered are listed, but only abnormal results are displayed)  Labs Reviewed - No data to display ____________________________________________  EKG   ____________________________________________  RADIOLOGY   No results found.  ____________________________________________    PROCEDURES  Procedure(s) performed:    Procedures    Medications - No data to display   ____________________________________________   INITIAL IMPRESSION / ASSESSMENT AND PLAN / ED COURSE  Pertinent labs & imaging results that were available during my care of the patient were reviewed by me and considered in my medical decision making (see chart for details).  Review of the [place] CSRS was performed in accordance of the [place] prior to dispensing  any controlled drugs.           Patient's  diagnosis is consistent with superficial injury to the EAC left side.  Patient presented to emergency department concern for blood identified on a Q-tip from the left ear.  Visualization reveals no evidence of traumatic TM injury.  Patient has a very superficial abrasion noted in the 6 o'clock position of the EAC.  Congealed blood identified from this superficial injury.  No active bleeding.  No foreign body.  No evidence of otitis externa or otitis media.  Patient will be prescribed Debrox for cerumen management of bilateral ears.  Follow-up primary care or ENT if needed..  Patient is given ED precautions to return to the ED for any worsening or new symptoms.     ____________________________________________  FINAL CLINICAL IMPRESSION(S) / ED DIAGNOSES  Final diagnoses:  Injury of external auditory canal, initial encounter      NEW MEDICATIONS STARTED DURING THIS VISIT:  ED Discharge Orders         Ordered    carbamide peroxide (DEBROX) 6.5 % OTIC solution  2 times daily     06/12/19 2054              This chart was dictated using voice recognition software/Dragon. Despite best efforts to proofread, errors can occur which can change the meaning. Any change was purely unintentional.    Darletta Moll, PA-C 06/12/19 2103    Drenda Freeze, MD 06/16/19 1501

## 2019-07-08 ENCOUNTER — Other Ambulatory Visit: Payer: Self-pay

## 2019-07-11 ENCOUNTER — Ambulatory Visit (INDEPENDENT_AMBULATORY_CARE_PROVIDER_SITE_OTHER): Payer: Medicare Other | Admitting: Family Medicine

## 2019-07-11 ENCOUNTER — Other Ambulatory Visit: Payer: Self-pay

## 2019-07-11 ENCOUNTER — Encounter: Payer: Self-pay | Admitting: Family Medicine

## 2019-07-11 VITALS — BP 115/60 | HR 94 | Temp 96.1°F | Ht 68.0 in | Wt 196.4 lb

## 2019-07-11 DIAGNOSIS — I25119 Atherosclerotic heart disease of native coronary artery with unspecified angina pectoris: Secondary | ICD-10-CM | POA: Diagnosis not present

## 2019-07-11 DIAGNOSIS — I10 Essential (primary) hypertension: Secondary | ICD-10-CM

## 2019-07-11 DIAGNOSIS — G4733 Obstructive sleep apnea (adult) (pediatric): Secondary | ICD-10-CM

## 2019-07-11 DIAGNOSIS — E7800 Pure hypercholesterolemia, unspecified: Secondary | ICD-10-CM

## 2019-07-11 DIAGNOSIS — E1159 Type 2 diabetes mellitus with other circulatory complications: Secondary | ICD-10-CM | POA: Diagnosis not present

## 2019-07-11 LAB — COMPREHENSIVE METABOLIC PANEL
ALT: 25 U/L (ref 0–53)
AST: 20 U/L (ref 0–37)
Albumin: 4.3 g/dL (ref 3.5–5.2)
Alkaline Phosphatase: 69 U/L (ref 39–117)
BUN: 17 mg/dL (ref 6–23)
CO2: 25 mEq/L (ref 19–32)
Calcium: 9.8 mg/dL (ref 8.4–10.5)
Chloride: 101 mEq/L (ref 96–112)
Creatinine, Ser: 0.9 mg/dL (ref 0.40–1.50)
GFR: 83.76 mL/min (ref >60.00)
Glucose, Bld: 177 mg/dL — ABNORMAL HIGH (ref 70–99)
Potassium: 3.7 mEq/L (ref 3.5–5.1)
Sodium: 137 mEq/L (ref 135–145)
Total Bilirubin: 1.4 mg/dL — ABNORMAL HIGH (ref 0.2–1.2)
Total Protein: 7 g/dL (ref 6.0–8.3)

## 2019-07-11 LAB — HEMOGLOBIN A1C: Hgb A1c MFr Bld: 8.4 % — ABNORMAL HIGH (ref 4.6–6.5)

## 2019-07-11 LAB — LIPID PANEL
Cholesterol: 125 mg/dL (ref 0–200)
HDL: 30.4 mg/dL — ABNORMAL LOW (ref >39.00)
NonHDL: 94.75
Total CHOL/HDL Ratio: 4
Triglycerides: 288 mg/dL — ABNORMAL HIGH (ref 0.0–149.0)
VLDL: 57.6 mg/dL — ABNORMAL HIGH (ref 0.0–40.0)

## 2019-07-11 LAB — LDL CHOLESTEROL, DIRECT: Direct LDL: 71 mg/dL

## 2019-07-11 MED ORDER — SITAGLIPTIN PHOSPHATE 100 MG PO TABS: 100.0000 mg | ORAL_TABLET | Freq: Every day | ORAL | 4 refills | Status: DC

## 2019-07-11 MED ORDER — METFORMIN HCL 1000 MG PO TABS: 1000.0000 mg | ORAL_TABLET | Freq: Two times a day (BID) | ORAL | 3 refills | Status: DC

## 2019-07-11 MED ORDER — JARDIANCE 25 MG PO TABS: 25.0000 mg | ORAL_TABLET | Freq: Every day | ORAL | 4 refills | Status: DC

## 2019-07-11 NOTE — Assessment & Plan Note (Signed)
Undetermined control.  Check A1c.  Continue current regimen.

## 2019-07-11 NOTE — Patient Instructions (Signed)
Nice to see you.  We will get lab work today and call with the results. Please consider getting the Covid vaccine when you are ready.

## 2019-07-11 NOTE — Assessment & Plan Note (Signed)
We will request a compliance report.  Continue CPAP.

## 2019-07-11 NOTE — Assessment & Plan Note (Signed)
Check lipid panel.  Continue current regimen.

## 2019-07-11 NOTE — Progress Notes (Signed)
Isaac Rumps, MD Phone: [phone]  [name] is a 69 y.o. male who presents today for f/u.  HYPERTENSION Disease Monitoring: Blood pressure range-118-138/70 Chest pain- no      Dyspnea- no Medications: Compliance- taking coreg, ramipril   Edema- no  DIABETES Disease Monitoring: Blood Sugar ranges-not checking Polyuria/phagia/dipsia- no      Optho- UTD Medications: Compliance- taking jardiance, januvia, metformin Hypoglycemic symptoms- rare if he does not eat 3 meals a day  HYPERLIPIDEMIA Disease Monitoring: See symptoms for Hypertension Medications: Compliance- taking lipitor Right upper quadrant pain- no  Muscle aches- no  Patient thinks he had Covid late last year.  His wife tested positive though he tested negative.  He had minor respiratory symptoms from that.  He wonders about getting COVID-19 vaccine.  We had a discussion about the 3 vaccines and the data that has come out.  I encouraged him to get whichever vaccination is available to him.  OSA: Patient does use a CPAP nightly.  It is managed through [facility].  He wakes well rested.  He does take a nap later in the day though no other hypersomnia.   Social History   Tobacco Use  Smoking Status Former Smoker  . Types: Cigarettes  . Quit date: 09/14/2007  . Years since quitting: 11.8  Smokeless Tobacco Never Used  Tobacco Comment   quit 2009     ROS see history of present illness  Objective  Physical Exam Vitals:   07/11/19 1352  BP: 115/60  Pulse: 94  Temp: (!) 96.1 F (35.6 C)  SpO2: 97%    BP Readings from Last 3 Encounters:  07/11/19 115/60  06/12/19 (!) 150/77  04/25/19 130/80   Wt Readings from Last 3 Encounters:  07/11/19 196 lb 6.4 oz (89.1 kg)  06/12/19 200 lb (90.7 kg)  06/06/19 195 lb (88.5 kg)    Physical Exam Constitutional:      General: He is not in acute distress.    Appearance: He is not diaphoretic.  Cardiovascular:     Rate and Rhythm: Normal rate and regular  rhythm.     Heart sounds: Normal heart sounds.  Pulmonary:     Effort: Pulmonary effort is normal.     Breath sounds: Normal breath sounds.  Musculoskeletal:     Right lower leg: No edema.     Left lower leg: No edema.  Skin:    General: Skin is warm and dry.  Neurological:     Mental Status: He is alert.      Assessment/Plan: Please see individual problem list.  HTN (hypertension) Adequate control.  Continue current regimen.  Check labs.  OSA (obstructive sleep apnea) We will request a compliance report.  Continue CPAP.  Type 2 diabetes mellitus, controlled ([place]) Undetermined control.  Check A1c.  Continue current regimen.  Hyperlipemia Check lipid panel.  Continue current regimen.   Patient very well likely had COVID-19 last fall.  He tested negative though I suspect is a false negative test given his symptoms and his exposure.  Discussed that he could get it a second time and I encouraged him to get the COVID-19 vaccine.  Health Maintenance: Encouraged patient to get COVID-19 vaccine.  Orders Placed This Encounter  Procedures  . Comp Met (CMET)  . Lipid panel  . HgB A1c    Meds ordered this encounter  Medications  . sitaGLIPtin (JANUVIA) 100 MG tablet    Sig: Take 1 tablet (100 mg total) by mouth daily.  Dispense:  90 tablet    Refill:  4  . empagliflozin (JARDIANCE) 25 MG TABS tablet    Sig: Take 25 mg by mouth daily.    Dispense:  90 tablet    Refill:  4  . metFORMIN (GLUCOPHAGE) 1000 MG tablet    Sig: Take 1 tablet (1,000 mg total) by mouth 2 (two) times daily with a meal.    Dispense:  180 tablet    Refill:  3    This visit occurred during the SARS-CoV-2 public health emergency.  Safety protocols were in place, including screening questions prior to the visit, additional usage of staff PPE, and extensive cleaning of exam room while observing appropriate contact time as indicated for disinfecting solutions.    Isaac Rumps, MD [place]

## 2019-07-11 NOTE — Assessment & Plan Note (Signed)
Adequate control.  Continue current regimen.  Check labs. 

## 2019-08-19 ENCOUNTER — Other Ambulatory Visit: Payer: Self-pay | Admitting: Cardiovascular Disease

## 2019-08-19 DIAGNOSIS — I25119 Atherosclerotic heart disease of native coronary artery with unspecified angina pectoris: Secondary | ICD-10-CM

## 2019-09-01 ENCOUNTER — Other Ambulatory Visit: Payer: Self-pay

## 2019-09-01 ENCOUNTER — Telehealth: Payer: Self-pay | Admitting: Cardiovascular Disease

## 2019-09-01 ENCOUNTER — Telehealth: Payer: Self-pay | Admitting: Family Medicine

## 2019-09-01 ENCOUNTER — Telehealth: Payer: Self-pay

## 2019-09-01 NOTE — Telephone Encounter (Signed)
Pt needs a refill on prasugrel (EFFIENT) 10 MG TABS tablets [facility]  Also needs all refills from now on to be [facility]

## 2019-09-01 NOTE — Telephone Encounter (Signed)
Please call to discuss cardiac medications. Patient is not sure which ones he is taking that are related to his heart, and if he needs refills.

## 2019-09-01 NOTE — Telephone Encounter (Signed)
Left voicemail message to call back regarding questions he had about medications.

## 2019-09-01 NOTE — Telephone Encounter (Signed)
His cardiologist should prescribe this for him. Please let the patient know to contact his cardiologist for a refill.

## 2019-09-01 NOTE — Telephone Encounter (Signed)
Pt needs a refill on prasugrel (EFFIENT) 10 MG TABS tablets [facility]  Also needs all refills from now on to be [facility], you did not write for this medication he received it from his Cardiologist. Please advise.  Dorthy Hustead,cma

## 2019-09-02 MED ORDER — PRASUGREL HCL 10 MG PO TABS: 10.0000 mg | ORAL_TABLET | Freq: Every day | ORAL | 3 refills | Status: DC

## 2019-09-02 MED ORDER — RAMIPRIL 2.5 MG PO CAPS: 2.5000 mg | ORAL_CAPSULE | Freq: Every morning | ORAL | 3 refills | Status: DC

## 2019-09-02 NOTE — Telephone Encounter (Signed)
I called and left a VM informing the patient that the cardiologist is the one that wrote the RX for Effient and he would need to call his cardiologist for a refill.  [name],cma

## 2019-09-02 NOTE — Telephone Encounter (Signed)
Spoke with patient and he reports that some medications from mail order pharmacy were 30 days. He states that his cost is cheaper for 90 day supply through them. Reviewed all cardiac medications and they are for 90 day supply. Advised that other medications would need to go through his PCP office. He verbalized understanding and was appreciative for the call and review.

## 2019-09-02 NOTE — Telephone Encounter (Signed)
Left voicemail message to call back  

## 2019-09-06 NOTE — Telephone Encounter (Signed)
Sent telephone encounter to Dr. Rockey Situ for review because he is on patch and viagra as well. Thanks

## 2019-09-06 NOTE — Telephone Encounter (Signed)
Please review and verify if Dr. Rockey Situ would be okay with refilling this medication.  It looks like Doss, Velora Heckler, NP prescribed it a few years ago.   Looks like patient also has a Nitroglycerin patch on his medication list too.

## 2019-09-07 ENCOUNTER — Other Ambulatory Visit: Payer: Self-pay

## 2019-09-07 NOTE — Telephone Encounter (Signed)
Left voicemail message to call back  

## 2019-09-07 NOTE — Telephone Encounter (Signed)
I am not sure where the Nitropatch came from I generally do not prescribe nitro patch or paste for outpatient use, does not mean that it can't be done it is just not as common We prefer long-acting nitro pill which is easier and cheaper There is a contraindication to being on nitro pill or patch or paste and Viagra This can cause rapid drop in pressure and should not be prescribed together Perhaps he can stop the patch.  If he uses the nitro spray would recommend he not do so when he has Viagra in his system 24 to 48 hours after taking the sildenafil

## 2019-09-13 NOTE — Telephone Encounter (Signed)
Left voicemail message to call back  

## 2019-09-14 NOTE — Telephone Encounter (Signed)
Left voicemail message to call back regarding refill request of nitroglycerin and contraindications with other medications.

## 2019-09-16 ENCOUNTER — Other Ambulatory Visit: Payer: Self-pay

## 2019-09-16 ENCOUNTER — Ambulatory Visit
Admission: EM | Admit: 2019-09-16 | Discharge: 2019-09-16 | Disposition: A | Discharge status: Home / Self Care | Payer: Medicare Other | Attending: Emergency Medicine | Admitting: Emergency Medicine

## 2019-09-16 DIAGNOSIS — H1033 Unspecified acute conjunctivitis, bilateral: Secondary | ICD-10-CM | POA: Diagnosis not present

## 2019-09-16 MED ORDER — POLYMYXIN B-TRIMETHOPRIM 10000-0.1 UNIT/ML-% OP SOLN: 1.0000 [drp] | Freq: Four times a day (QID) | OPHTHALMIC | 0 refills | Status: AC

## 2019-09-16 NOTE — Discharge Instructions (Addendum)
Use the antibiotic eyedrops as prescribed.    Follow-up with your eye doctor for a recheck in 1 to 2 days if your symptoms are not improving.    Go to the emergency department if you have acute eye pain or changes in your vision.    

## 2019-09-16 NOTE — ED Triage Notes (Signed)
Patient complains of right eye pain that started Sunday/monday. Patient states that it feels very irritated and uncomfortable. Patient states that he has noticed some blurry vision. Reports relief when eye is closed and has been using systane.

## 2019-09-16 NOTE — ED Provider Notes (Signed)
Roderic Palau    CSN: [id number] Arrival date & time: 09/16/19  0850      History   Chief Complaint Chief Complaint  Patient presents with  . Eye Pain    right    HPI [name] is a 69 y.o. male.   Patient presents with right eye itching, redness, discomfort, photophobia, yellow drainage, crusting x5 days.  He states the symptoms have now begun in his left eye also.  He reports blurry vision.  He denies acute eye pain or other changes in his vision.  He has been treating his symptoms at home with Systane.  He denies fever, chills, sore throat, cough, or other symptoms.  The history is provided by the patient.    Past Medical History:  Diagnosis Date  . Angina   . Asthma   . Bursitis of left elbow 2018  . CAD (coronary artery disease) 04/12/2011   a. remote MI in 1996; b. MI 1997 s/p stenting to LAD & diag; c. MI 2009: long stenting from pLAD to mid LAD and diag & POBA of jailed diag branch; d. cath 2010: 80% ISR of LAD w/ L-R collats, med Rx; e. cath 2012: 50-60% tub mLAD, 60-70% dLAD, FFR 0.75 -->s/p PCI dissection repaired w/ 2 stents (3 total); f. cath 06/2012: no changes from 2012 cath, med Rx, patent stents   . Diabetes mellitus without complication ([place])   . ED (erectile dysfunction)   . Heart disease   . HTN (hypertension) 04/12/2011  . Hyperlipemia 04/12/2011  . Ischemic cardiomyopathy    a. echo 2010: EF 45-50%, mild to mod ant and apical wall HK, trace MR; b. cardiac cath 06/2012: EF 40%, mild MR   . Myocardial infarction ([place])    x 5  . Obesity   . OSA (obstructive sleep apnea)    on CPAP  . Osteoarthritis   . Renal disorder    kidney stone    Patient Active Problem List   Diagnosis Date Noted  . Bone spicules of jaw 12/27/2018  . Ingrowing nail 12/27/2018  . Insomnia 02/18/2018  . Ingrown toenail 01/26/2018  . 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 Right shoulder pain 03/03/2017  . History of colon polyps 12/01/2016  . Olecranon bursitis of left  elbow 12/01/2016  . OSA (obstructive sleep apnea) 07/30/2016  . Low back pain 05/15/2016  . Actinic keratoses 02/07/2016  . Right hip pain 02/07/2016  . Ischemic cardiomyopathy   . Type 2 diabetes mellitus, controlled ([place]) 11/30/2014  . Sleep disturbance 08/08/2013  . Angina at rest [facility]) 04/12/2011  . CAD (coronary artery disease) 04/12/2011  . HTN (hypertension) 04/12/2011  . Hyperlipemia 04/12/2011    Past Surgical History:  Procedure Laterality Date  . CORONARY ANGIOPLASTY     x 5  . CORONARY STENT PLACEMENT     x 7  . LEFT HEART CATHETERIZATION WITH CORONARY ANGIOGRAM N/A 04/14/2011   Procedure: LEFT HEART CATHETERIZATION WITH CORONARY ANGIOGRAM;  Surgeon: Pixie Casino, MD;  Location[facility] CATH LAB;  Service: Cardiovascular;  Laterality: N/A;  . LEFT HEART CATHETERIZATION WITH CORONARY ANGIOGRAM N/A 06/25/2012   Procedure: LEFT HEART CATHETERIZATION WITH CORONARY ANGIOGRAM;  Surgeon: Peter M Martinique, MD;  Location: [facility] CATH LAB;  Service: Cardiovascular;  Laterality: N/A;  . [place] Medications    Prior to Admission medications   Medication Sig Start Date End Date Taking? Authorizing Provider  aspirin EC 81 MG  tablet Take 1 tablet (81 mg total) by mouth daily. 10/02/16  Yes Dunn, Areta Haber, PA-C  atorvastatin (LIPITOR) 80 MG tablet TAKE 1 TABLET DAILY 08/19/19  Yes Gollan, Kathlene November, MD  blood glucose meter kit and supplies KIT Dispense based on patient and insurance preference. Use once daily fasting. (FOR ICD 10 DIAGNOSIS OF E11.59). 05/15/16  Yes [name], MD  carvedilol (COREG) 6.25 MG tablet TAKE 1 TABLET TWICE A DAY WITH MEALS 08/19/19  Yes Minna Merritts, MD  diclofenac sodium (VOLTAREN) 1 % GEL Apply topically to affected area qid 02/02/18  Yes Gerda Diss, DO  empagliflozin (JARDIANCE) 25 MG TABS tablet Take 25 mg by mouth daily. 07/11/19  Yes [name], MD  Lactobacillus (ACIDOPHILUS PROBIOTIC) 100 MG CAPS  Take 1 capsule (100 mg total) by mouth daily. Patient taking differently: Take 100 mg by mouth daily as needed.  04/23/15  Yes Bettey Costa, MD  metFORMIN (GLUCOPHAGE) 1000 MG tablet Take 1 tablet (1,000 mg total) by mouth 2 (two) times daily with a meal. 07/11/19  Yes [name], MD  Multiple Vitamins-Minerals (MULTIVITAMINS THER. W/MINERALS) TABS Take 1 tablet by mouth every morning.    Yes [provider]  niacin (NIASPAN) 1000 MG CR tablet TAKE 2 TABLETS (2000 MG TOTAL) AT BEDTIME 08/19/19  Yes Gollan, Kathlene November, MD  nitroGLYCERIN (NITRODUR - DOSED IN MG/24 HR) 0.2 mg/hr patch Place 1/4 to 1/2 of a patch over affected region. Remove and replace once daily.  Slightly alter skin placement daily 04/13/18  Yes Gerda Diss, DO  nitroGLYCERIN (NITROLINGUAL) 0.4 MG/SPRAY spray Place 1 spray under the tongue every 5 (five) minutes as needed for chest pain. 11/17/14  Yes Doss, Velora Heckler, RN  prasugrel (EFFIENT) 10 MG TABS tablet Take 1 tablet (10 mg total) by mouth daily. 09/02/19  Yes Minna Merritts, MD  ramipril (ALTACE) 2.5 MG capsule Take 1 capsule (2.5 mg total) by mouth every morning. 09/02/19  Yes Minna Merritts, MD  ranolazine (RANEXA) 1000 MG SR tablet TAKE 1 TABLET TWICE A DAY 08/19/19  Yes Wellington Hampshire, MD  sildenafil (VIAGRA) 100 MG tablet Take 1 tablet (100 mg total) by mouth daily as needed for erectile dysfunction. 03/14/13  Yes Gollan, Kathlene November, MD  sitaGLIPtin (JANUVIA) 100 MG tablet Take 1 tablet (100 mg total) by mouth daily. 07/11/19  Yes [name], MD  carbamide peroxide (DEBROX) 6.5 % OTIC solution Place 5 drops into the left ear 2 (two) times daily. 06/12/19 06/11/20  Cuthriell, Charline Bills, PA-C  trimethoprim-polymyxin b (POLYTRIM) ophthalmic solution Place 1 drop into both eyes 4 (four) times daily for 7 days. 09/16/19 09/23/19  Sharion Balloon, NP    Family History Family History  Problem Relation Age of Onset  . COPD Mother   . Other Father        muscle  tumor  . Stroke Father   . Prostate cancer Father        mets  . Heart disease Father   . Alcoholism Father   . Stroke Sister   . Heart block Sister        Heart bypass  . Bladder Cancer Neg Hx   . Kidney cancer Neg Hx     Social History Social History   Tobacco Use  . Smoking status: Former Smoker    Types: Cigarettes    Quit date: 09/14/2007    Years since quitting: 12.0  . Smokeless tobacco:  Never Used  . Tobacco comment: quit 2009  Substance Use Topics  . Alcohol use: No    Alcohol/week: 0.0 standard drinks    Comment: rarely 1 beer in last 5 years  . Drug use: No    Types: Marijuana    Comment: pt stopped fall 2012     Allergies   Atarax [hydroxyzine hcl], Hydroxyzine hcl, and Latex   Review of Systems Review of Systems  Constitutional: Negative for chills and fever.  HENT: Negative for ear pain and sore throat.   Eyes: Positive for photophobia, pain, discharge, redness, itching and visual disturbance.  Respiratory: Negative for cough and shortness of breath.   Cardiovascular: Negative for chest pain and palpitations.  Gastrointestinal: Negative for abdominal pain and vomiting.  Genitourinary: Negative for dysuria and hematuria.  Musculoskeletal: Negative for arthralgias and back pain.  Skin: Negative for color change and rash.  Neurological: Negative for seizures and syncope.  All other systems reviewed and are negative.    Physical Exam Triage Vital Signs ED Triage Vitals  Enc Vitals Group     BP 09/16/19 0856 (!) 153/77     Pulse Rate 09/16/19 0856 84     Resp 09/16/19 0856 16     Temp 09/16/19 0856 98.2 F (36.8 C)     Temp Source 09/16/19 0856 Oral     SpO2 09/16/19 0856 97 %     Weight 09/16/19 0852 195 lb (88.5 kg)     Height 09/16/19 0852 '5\' 8"'  (1.727 m)     Head Circumference --      Peak Flow --      Pain Score 09/16/19 0852 2     Pain Loc --      Pain Edu? --      Excl. in [place]? --    No data found.  Updated Vital Signs BP (!)  153/77 (BP Location: Left Arm)   Pulse 84   Temp 98.2 F (36.8 C) (Oral)   Resp 16   Ht '5\' 8"'  (1.727 m)   Wt 195 lb (88.5 kg)   SpO2 97%   BMI 29.65 kg/m   Visual Acuity Right Eye Distance: couldn't see the chart Left Eye Distance: 20/50 Bilateral Distance:    Right Eye Near:   Left Eye Near:    Bilateral Near:     Physical Exam Vitals and nursing note reviewed.  Constitutional:      General: He is not in acute distress.    Appearance: He is well-developed.  HENT:     Head: Normocephalic and atraumatic.     Mouth/Throat:     Mouth: Mucous membranes are moist.     Pharynx: Oropharynx is clear.  Eyes:     General:        Right eye: No foreign body.        Left eye: No foreign body.     Extraocular Movements: Extraocular movements intact.     Conjunctiva/sclera:     Right eye: Right conjunctiva is injected.     Left eye: Left conjunctiva is injected.     Pupils: Pupils are equal, round, and reactive to light.     Comments: Injected R>L  Cardiovascular:     Rate and Rhythm: Normal rate and regular rhythm.     Heart sounds: No murmur.  Pulmonary:     Effort: Pulmonary effort is normal. No respiratory distress.     Breath sounds: Normal breath sounds.  Abdominal:     Palpations:  Abdomen is soft.     Tenderness: There is no abdominal tenderness.  Musculoskeletal:     Cervical back: Neck supple.  Skin:    General: Skin is warm and dry.     Findings: No rash.  Neurological:     General: No focal deficit present.     Mental Status: He is alert and oriented to person, place, and time.     Gait: Gait normal.  Psychiatric:        Mood and Affect: Mood normal.        Behavior: Behavior normal.      UC Treatments / Results  Labs (all labs ordered are listed, but only abnormal results are displayed) Labs Reviewed - No data to display  EKG   Radiology No results found.  Procedures Procedures (including critical care time)  Medications Ordered in  UC Medications - No data to display  Initial Impression / Assessment and Plan / UC Course  I have reviewed the triage vital signs and the nursing notes.  Pertinent labs & imaging results that were available during my care of the patient were reviewed by me and considered in my medical decision making (see chart for details).   Acute bacterial conjunctivitis.  Treating with Polytrim eye drops.  Instructed patient to follow-up with his eye care provider if his symptoms are not improving in the next 1 to 2 days.  Instructed him to go to the emergency department if he has acute eye pain or changes in his vision.  Patient agrees to plan of care.   Final Clinical Impressions(s) / UC Diagnoses   Final diagnoses:  Acute bacterial conjunctivitis of both eyes     Discharge Instructions     Use the antibiotic eyedrops as prescribed.    Follow-up with your eye doctor for a recheck in 1 to 2 days if your symptoms are not improving.    Go to the emergency department if you have acute eye pain or changes in your vision.       ED Prescriptions    Medication Sig Dispense Auth. Provider   trimethoprim-polymyxin b (POLYTRIM) ophthalmic solution Place 1 drop into both eyes 4 (four) times daily for 7 days. 10 mL Sharion Balloon, NP     PDMP not reviewed this encounter.   Sharion Balloon, NP 09/16/19 [phone]

## 2019-09-20 NOTE — Telephone Encounter (Signed)
Multiple attempts to reach patient to review his request. No refills provided because we need to review medications.

## 2019-10-11 ENCOUNTER — Ambulatory Visit: Payer: Medicare Other | Admitting: Family Medicine

## 2019-10-11 DIAGNOSIS — Z0289 Encounter for other administrative examinations: Secondary | ICD-10-CM

## 2019-10-14 ENCOUNTER — Telehealth: Payer: Self-pay | Admitting: Cardiovascular Disease

## 2019-10-14 NOTE — Telephone Encounter (Signed)
Patient calling in states he just got a message from out office from a month ago due to phone issues. Patient stated the call was regarding a refill for nitroglycerin.  Please advise patient when available

## 2019-10-18 ENCOUNTER — Ambulatory Visit (INDEPENDENT_AMBULATORY_CARE_PROVIDER_SITE_OTHER): Payer: Medicare Other | Admitting: Family Medicine

## 2019-10-18 ENCOUNTER — Encounter: Payer: Self-pay | Admitting: Family Medicine

## 2019-10-18 ENCOUNTER — Other Ambulatory Visit: Payer: Self-pay

## 2019-10-18 VITALS — BP 118/70 | HR 88 | Temp 98.0°F | Ht 68.0 in | Wt 193.4 lb

## 2019-10-18 DIAGNOSIS — E1159 Type 2 diabetes mellitus with other circulatory complications: Secondary | ICD-10-CM | POA: Diagnosis not present

## 2019-10-18 DIAGNOSIS — D3500 Benign neoplasm of unspecified adrenal gland: Secondary | ICD-10-CM | POA: Diagnosis not present

## 2019-10-18 DIAGNOSIS — I25119 Atherosclerotic heart disease of native coronary artery with unspecified angina pectoris: Secondary | ICD-10-CM | POA: Diagnosis not present

## 2019-10-18 DIAGNOSIS — E278 Other specified disorders of adrenal gland: Secondary | ICD-10-CM | POA: Insufficient documentation

## 2019-10-18 DIAGNOSIS — I10 Essential (primary) hypertension: Secondary | ICD-10-CM

## 2019-10-18 DIAGNOSIS — Z1211 Encounter for screening for malignant neoplasm of colon: Secondary | ICD-10-CM

## 2019-10-18 LAB — POCT GLYCOSYLATED HEMOGLOBIN (HGB A1C): Hemoglobin A1C: 7.6 % — AB (ref 4.0–5.6)

## 2019-10-18 NOTE — Patient Instructions (Signed)
Nice to see you. Please continue to work on diet and exercise.  We will have you come back in 3 months for follow-up.

## 2019-10-18 NOTE — Assessment & Plan Note (Signed)
Refer to GI 

## 2019-10-18 NOTE — Assessment & Plan Note (Signed)
Benign lab work previously.  Imaging consistent with benign adrenal adenoma.

## 2019-10-18 NOTE — Assessment & Plan Note (Signed)
Well-controlled.  Continue current regimen. 

## 2019-10-18 NOTE — Progress Notes (Signed)
  [name], MD Phone: [phone]  [name] is a 69 y.o. male who presents today for f/u.  HYPERTENSION/CAD Disease Monitoring  Home BP Monitoring not checking Chest pain- no    Dyspnea- no Medications  Compliance-  Taking ramipril, coreg, aspirin, effient, ranexa  Edema- no  DIABETES Disease Monitoring: Blood Sugar ranges-140-150 mostly Polyuria/phagia/dipsia- no      Optho- UTD Medications: Compliance- taking metformin, jardiance, najuvia Hypoglycemic symptoms- one time and it was 105, felt poorly Patient has been working on diet changes and exercise.  He has been walking 2-1/2 miles 4-5 times a week.  Also has decreased sugar and carb intake quite a bit.  Social History   Tobacco Use  Smoking Status Former Smoker  . Types: Cigarettes  . Quit date: 09/14/2007  . Years since quitting: 12.1  Smokeless Tobacco Never Used  Tobacco Comment   quit 2009     ROS see history of present illness  Objective  Physical Exam Vitals:   10/18/19 0832  BP: 118/70  Pulse: 88  Temp: 98 F (36.7 C)  SpO2: 98%    BP Readings from Last 3 Encounters:  10/18/19 118/70  09/16/19 (!) 153/77  07/11/19 115/60   Wt Readings from Last 3 Encounters:  10/18/19 193 lb 6.4 oz (87.7 kg)  09/16/19 195 lb (88.5 kg)  07/11/19 196 lb 6.4 oz (89.1 kg)    Physical Exam Constitutional:      General: He is not in acute distress.    Appearance: He is not diaphoretic.  Cardiovascular:     Rate and Rhythm: Normal rate and regular rhythm.     Heart sounds: Normal heart sounds.  Pulmonary:     Effort: Pulmonary effort is normal.     Breath sounds: Normal breath sounds.  Musculoskeletal:     Right lower leg: No edema.     Left lower leg: No edema.  Skin:    General: Skin is warm and dry.  Neurological:     Mental Status: He is alert.      Assessment/Plan: Please see individual problem list.  HTN (hypertension) Well-controlled.  Continue current regimen.  Type 2  diabetes mellitus, controlled ([place]) Still above goal.  Discussed adding on once weekly injection though he defers this.  He wants to continue to work on diet and exercise.  Continue current medication.  He will follow up in 3 months for recheck.  Adrenal adenoma Benign lab work previously.  Imaging consistent with benign adrenal adenoma.  Colon cancer screening Refer to GI.   Orders Placed This Encounter  Procedures  . Ambulatory referral to Gastroenterology    Referral Priority:   Routine    Referral Type:   Consultation    Referral Reason:   Specialty Services Required    Number of Visits Requested:   1  . POCT HgB A1C    No orders of the defined types were placed in this encounter.   This visit occurred during the SARS-CoV-2 public health emergency.  Safety protocols were in place, including screening questions prior to the visit, additional usage of staff PPE, and extensive cleaning of exam room while observing appropriate contact time as indicated for disinfecting solutions.    [name], MD [place]

## 2019-10-18 NOTE — Assessment & Plan Note (Addendum)
Still above goal.  Discussed adding on once weekly injection though he defers this.  He wants to continue to work on diet and exercise.  Continue current medication.  He will follow up in 3 months for recheck.

## 2019-11-01 ENCOUNTER — Telehealth: Payer: Self-pay

## 2019-11-01 ENCOUNTER — Telehealth: Payer: Medicare Other

## 2019-11-01 NOTE — Telephone Encounter (Signed)
Patient was scheduled for a telephone nurse visit this morning at 10:30 to schedule his colonoscopy.  I was unable to contact him.  LVM for him to call office back to reschedule.  Thanks,  Little Browning, [place]

## 2019-11-04 ENCOUNTER — Telehealth (INDEPENDENT_AMBULATORY_CARE_PROVIDER_SITE_OTHER): Payer: Medicare Other | Admitting: Gastroenterology

## 2019-11-04 ENCOUNTER — Other Ambulatory Visit: Payer: Self-pay

## 2019-11-04 DIAGNOSIS — Z86010 Personal history of colonic polyps: Secondary | ICD-10-CM

## 2019-11-04 NOTE — Progress Notes (Signed)
Gastroenterology Pre-Procedure Review  Request Date: Not Scheduled at this time.  Patient will call back after he decides on scheduling.  I will send him a copy of his colonoscopy report from 11/08/13 because he thought his colonoscopy was normal.    Bowel Prep Preferred by patient [facility] Purlax powdered laxative.  Pt has been advised that this prep is not a prep that we use for colonoscopy preps.  We discussed the alternative prep solutions and due to his intolerance to the liquid bowel preps he prefers [facility] Purlax Powder Laxative.  Requesting Physician: Vicente Males  PATIENT REVIEW QUESTIONS: The patient responded to the following health history questions as indicated:    1. Are you having any GI issues? no referral indicates screening colonoscopy however there is a history of colon polyps noted. 2. Do you have a personal history of Polyps? yes (several polyps were noted on 11/08/13 Colonoscopy performed by Dr. Mellody Life) 3. Do you have a family history of Colon Cancer or Polyps? no 4. Diabetes Mellitus? no 5. Joint replacements in the past 12 months?no 6. Major health problems in the past 3 months?no 7. Any artificial heart valves, MVP, or defibrillator?Patient does have 7 stents.  His cardiologist is Dr. Rockey Situ.  Cardiac request will be sent upon scheduling his colonoscopy.      MEDICATIONS & ALLERGIES:    Patient reports the following regarding taking any anticoagulation/antiplatelet therapy:   Plavix, Coumadin, Eliquis, Xarelto, Lovenox, Pradaxa, Brilinta, or Effient? no Aspirin? yes (81 mg)  Patient confirms/reports the following medications:  Current Outpatient Medications  Medication Sig Dispense Refill  . aspirin EC 81 MG tablet Take 1 tablet (81 mg total) by mouth daily. 90 tablet 3  . atorvastatin (LIPITOR) 80 MG tablet TAKE 1 TABLET DAILY 90 tablet 1  . blood glucose meter kit and supplies KIT Dispense based on patient and insurance preference. Use once daily fasting. (FOR ICD 10  DIAGNOSIS OF E11.59). 1 each 0  . carvedilol (COREG) 6.25 MG tablet TAKE 1 TABLET TWICE A DAY WITH MEALS 180 tablet 1  . diclofenac sodium (VOLTAREN) 1 % GEL Apply topically to affected area qid 100 g 1  . empagliflozin (JARDIANCE) 25 MG TABS tablet Take 25 mg by mouth daily. 90 tablet 4  . Lactobacillus (ACIDOPHILUS PROBIOTIC) 100 MG CAPS Take 1 capsule (100 mg total) by mouth daily. (Patient taking differently: Take 100 mg by mouth daily as needed. ) 15 capsule 0  . metFORMIN (GLUCOPHAGE) 1000 MG tablet Take 1 tablet (1,000 mg total) by mouth 2 (two) times daily with a meal. 180 tablet 3  . Multiple Vitamins-Minerals (MULTIVITAMINS THER. W/MINERALS) TABS Take 1 tablet by mouth every morning.     . niacin (NIASPAN) 1000 MG CR tablet TAKE 2 TABLETS (2000 MG TOTAL) AT BEDTIME 180 tablet 1  . nitroGLYCERIN (NITRODUR - DOSED IN MG/24 HR) 0.2 mg/hr patch Place 1/4 to 1/2 of a patch over affected region. Remove and replace once daily.  Slightly alter skin placement daily 30 patch 1  . nitroGLYCERIN (NITROLINGUAL) 0.4 MG/SPRAY spray Place 1 spray under the tongue every 5 (five) minutes as needed for chest pain. 12 g 12  . prasugrel (EFFIENT) 10 MG TABS tablet Take 1 tablet (10 mg total) by mouth daily. 90 tablet 3  . ramipril (ALTACE) 2.5 MG capsule Take 1 capsule (2.5 mg total) by mouth every morning. 90 capsule 3  . ranolazine (RANEXA) 1000 MG SR tablet TAKE 1 TABLET TWICE A DAY 180 tablet 1  .  sildenafil (VIAGRA) 100 MG tablet Take 1 tablet (100 mg total) by mouth daily as needed for erectile dysfunction. 7 tablet 5  . sitaGLIPtin (JANUVIA) 100 MG tablet Take 1 tablet (100 mg total) by mouth daily. 90 tablet 4   No current facility-administered medications for this visit.    Patient confirms/reports the following allergies:  Allergies  Allergen Reactions  . Atarax [Hydroxyzine Hcl]     Weakness, out of this world feeling.  Marland Kitchen Hydroxyzine Hcl Other (See Comments)    Weakness, out of this world  feeling. sluggishness  . Latex Rash    Other reaction(s): Other (See Comments) I.e. Elastic= rash to blisters if worn for an extended time Patient show effects after long exposure.    No orders of the defined types were placed in this encounter.   AUTHORIZATION INFORMATION Primary Insurance: 1D#: Group #:  Secondary Insurance: 1D#: Group #:  SCHEDULE INFORMATION: Date: Not scheduled at this time per patients request. Time: Location:[facility]

## 2019-12-14 ENCOUNTER — Other Ambulatory Visit: Payer: Self-pay | Admitting: Cardiovascular Disease

## 2019-12-14 ENCOUNTER — Other Ambulatory Visit: Payer: Self-pay | Admitting: *Deleted

## 2019-12-14 ENCOUNTER — Telehealth: Payer: Self-pay | Admitting: Family Medicine

## 2019-12-14 DIAGNOSIS — I25119 Atherosclerotic heart disease of native coronary artery with unspecified angina pectoris: Secondary | ICD-10-CM

## 2019-12-14 MED ORDER — NIACIN ER (ANTIHYPERLIPIDEMIC) 1000 MG PO TBCR: EXTENDED_RELEASE_TABLET | ORAL | 1 refills | Status: DC

## 2019-12-14 MED ORDER — RANOLAZINE ER 1000 MG PO TB12: 1000.0000 mg | ORAL_TABLET | Freq: Two times a day (BID) | ORAL | 1 refills | Status: DC

## 2019-12-14 MED ORDER — NITROGLYCERIN 0.4 MG/SPRAY TL SOLN: 1.0000 | 1 refills | Status: DC | PRN

## 2019-12-14 MED ORDER — CARVEDILOL 6.25 MG PO TABS: 6.2500 mg | ORAL_TABLET | Freq: Two times a day (BID) | ORAL | 1 refills | Status: DC

## 2019-12-14 MED ORDER — ATORVASTATIN CALCIUM 80 MG PO TABS: 80.0000 mg | ORAL_TABLET | Freq: Every day | ORAL | 1 refills | Status: DC

## 2019-12-14 NOTE — Telephone Encounter (Signed)
"  Rejection Reason - Patient Declined" [facility] said 6 days ago

## 2019-12-14 NOTE — Telephone Encounter (Signed)
*  STAT* If patient is at the pharmacy, call can be transferred to refill team.   1. Which medications need to be refilled? (please list name of each medication and dose if known) nitroglycerin (NITROLINGUAL) 0.4 MG SPRAY  2. Which pharmacy/location (including street and city if local pharmacy) is medication to be sent to? [facility]  3. Do they need a 30 day or 90 day supply?

## 2019-12-14 NOTE — Telephone Encounter (Signed)
Please advise if ok to refill Nitro last filled by PCP.

## 2019-12-15 ENCOUNTER — Other Ambulatory Visit: Payer: Medicare Other

## 2019-12-16 NOTE — Telephone Encounter (Signed)
Noted.  We will discuss further at follow-up.

## 2019-12-19 ENCOUNTER — Other Ambulatory Visit: Payer: Medicare Other

## 2020-01-11 ENCOUNTER — Ambulatory Visit: Payer: Medicare Other | Admitting: Family Medicine

## 2020-01-25 ENCOUNTER — Other Ambulatory Visit: Payer: Self-pay

## 2020-01-25 ENCOUNTER — Telehealth (INDEPENDENT_AMBULATORY_CARE_PROVIDER_SITE_OTHER): Payer: Medicare Other | Admitting: Family Medicine

## 2020-01-25 ENCOUNTER — Encounter: Payer: Self-pay | Admitting: Family Medicine

## 2020-01-25 DIAGNOSIS — E1159 Type 2 diabetes mellitus with other circulatory complications: Secondary | ICD-10-CM

## 2020-01-25 DIAGNOSIS — I2510 Atherosclerotic heart disease of native coronary artery without angina pectoris: Secondary | ICD-10-CM | POA: Diagnosis not present

## 2020-01-25 DIAGNOSIS — I10 Essential (primary) hypertension: Secondary | ICD-10-CM

## 2020-01-25 DIAGNOSIS — R21 Rash and other nonspecific skin eruption: Secondary | ICD-10-CM | POA: Insufficient documentation

## 2020-01-25 MED ORDER — TRIAMCINOLONE ACETONIDE 0.1 % EX CREA: 1.0000 "application " | TOPICAL_CREAM | Freq: Two times a day (BID) | CUTANEOUS | 0 refills | Status: DC

## 2020-01-25 NOTE — Assessment & Plan Note (Signed)
Asymptomatic.  Continue aspirin 81 mg once daily, carvedilol 6.25 mg twice daily, Effient 10 mg once daily, Ranexa 1000 mg twice daily, and ramipril 2.5 mg once daily.  He will come in for lab work.

## 2020-01-25 NOTE — Assessment & Plan Note (Signed)
Seems to have had improved control.  He will come in for an A1c.  Continue Januvia 100 mg once daily, Metformin 1000 mg twice daily, and Jardiance 25 mg once daily.  I encouraged him to see the eye doctor once a year.

## 2020-01-25 NOTE — Assessment & Plan Note (Signed)
Well-controlled.  He will continue ramipril 2.5 mg once daily and carvedilol 6.25 mg twice daily.

## 2020-01-25 NOTE — Progress Notes (Signed)
Virtual Visit via telephone Note  This visit type was conducted due to national recommendations for restrictions regarding the COVID-19 pandemic (e.g. social distancing).  This format is felt to be most appropriate for this patient at this time.  All issues noted in this document were discussed and addressed.  No physical exam was performed (except for noted visual exam findings with Video Visits).   I connected with [name] today at  9:00 AM EDT by telephone and verified that I am speaking with the correct person using two identifiers. Location patient: home Location provider: work  Persons participating in the virtual visit: patient, provider  I discussed the limitations, risks, security and privacy concerns of performing an evaluation and management service by telephone and the availability of in person appointments. I also discussed with the patient that there may be a patient responsible charge related to this service. The patient expressed understanding and agreed to proceed.  Interactive audio and video telecommunications were attempted between this provider and patient, however failed, due to patient having technical difficulties OR patient did not have access to video capability.  We continued and completed visit with audio only.  Reason for visit: f/u.  HPI: DIABETES Disease Monitoring: Blood Sugar ranges-<150 fasting, excursion to 185 after ice cream Polyuria/phagia/dipsia- no      Optho- due Medications: Compliance- taking januvia, metformin, jardiance Hypoglycemic symptoms- rare if he does not eat frequently enough, eats something and improves, notes sugar was 125 the last time this occurred.   HYPERTENSION/CAD Disease Monitoring  Home BP Monitoring 126/79 Chest pain- no    Dyspnea- no Medications  Compliance-  Taking aspirin, coreg, effient, ramipril, ranexa.   Edema- no Walks 2.3 miles qod for exercise.   Rash: Notes rash on bilateral upper extremities its been  there for several years.  It is a red patch about the size of his pinky fingernail.  Also has an area on his left shoulder blade that has been present for about a year.  Is about three quarters of an inch and is raised.  They itch at times.  No pain.      ROS: See pertinent positives and negatives per HPI.  Past Medical History:  Diagnosis Date  . Angina   . Asthma   . Bursitis of left elbow 2018  . CAD (coronary artery disease) 04/12/2011   a. remote MI in 1996; b. MI 1997 s/p stenting to LAD & diag; c. MI 2009: long stenting from pLAD to mid LAD and diag & POBA of jailed diag branch; d. cath 2010: 80% ISR of LAD w/ L-R collats, med Rx; e. cath 2012: 50-60% tub mLAD, 60-70% dLAD, FFR 0.75 -->s/p PCI dissection repaired w/ 2 stents (3 total); f. cath 06/2012: no changes from 2012 cath, med Rx, patent stents   . Diabetes mellitus without complication ([place])   . ED (erectile dysfunction)   . Heart disease   . HTN (hypertension) 04/12/2011  . Hyperlipemia 04/12/2011  . Ischemic cardiomyopathy    a. echo 2010: EF 45-50%, mild to mod ant and apical wall HK, trace MR; b. cardiac cath 06/2012: EF 40%, mild MR   . Myocardial infarction ([place])    x 5  . Obesity   . OSA (obstructive sleep apnea)    on CPAP  . Osteoarthritis   . Renal disorder    kidney stone    Past Surgical History:  Procedure Laterality Date  . CORONARY ANGIOPLASTY     x 5  .  CORONARY STENT PLACEMENT     x 7  . LEFT HEART CATHETERIZATION WITH CORONARY ANGIOGRAM N/A 04/14/2011   Procedure: LEFT HEART CATHETERIZATION WITH CORONARY ANGIOGRAM;  Surgeon: Pixie Casino, MD;  Location: [facility] CATH LAB;  Service: Cardiovascular;  Laterality: N/A;  . LEFT HEART CATHETERIZATION WITH CORONARY ANGIOGRAM N/A 06/25/2012   Procedure: LEFT HEART CATHETERIZATION WITH CORONARY ANGIOGRAM;  Surgeon: Peter M Martinique, MD;  Location: [facility] CATH LAB;  Service: Cardiovascular;  Laterality: N/A;  . TONSILLECTOMY AND ADENOIDECTOMY  1955    Family  History  Problem Relation Age of Onset  . COPD Mother   . Other Father        muscle tumor  . Stroke Father   . Prostate cancer Father        mets  . Heart disease Father   . Alcoholism Father   . Stroke Sister   . Heart block Sister        Heart bypass  . Bladder Cancer Neg Hx   . Kidney cancer Neg Hx     SOCIAL HX: Former smoker   Current Outpatient Medications:  .  aspirin EC 81 MG tablet, Take 1 tablet (81 mg total) by mouth daily., Disp: 90 tablet, Rfl: 3 .  atorvastatin (LIPITOR) 80 MG tablet, Take 1 tablet (80 mg total) by mouth daily., Disp: 90 tablet, Rfl: 1 .  blood glucose meter kit and supplies KIT, Dispense based on patient and insurance preference. Use once daily fasting. (FOR ICD 10 DIAGNOSIS OF E11.59)., Disp: 1 each, Rfl: 0 .  carvedilol (COREG) 6.25 MG tablet, Take 1 tablet (6.25 mg total) by mouth 2 (two) times daily with a meal., Disp: 180 tablet, Rfl: 1 .  diclofenac sodium (VOLTAREN) 1 % GEL, Apply topically to affected area qid, Disp: 100 g, Rfl: 1 .  empagliflozin (JARDIANCE) 25 MG TABS tablet, Take 25 mg by mouth daily., Disp: 90 tablet, Rfl: 4 .  Lactobacillus (ACIDOPHILUS PROBIOTIC) 100 MG CAPS, Take 1 capsule (100 mg total) by mouth daily. (Patient taking differently: Take 100 mg by mouth daily as needed. ), Disp: 15 capsule, Rfl: 0 .  metFORMIN (GLUCOPHAGE) 1000 MG tablet, Take 1 tablet (1,000 mg total) by mouth 2 (two) times daily with a meal., Disp: 180 tablet, Rfl: 3 .  Multiple Vitamins-Minerals (MULTIVITAMINS THER. W/MINERALS) TABS, Take 1 tablet by mouth every morning. , Disp: , Rfl:  .  niacin (NIASPAN) 1000 MG CR tablet, Take 2 tablets by mouth at bedtime daily., Disp: 180 tablet, Rfl: 1 .  nitroGLYCERIN (NITRODUR - DOSED IN MG/24 HR) 0.2 mg/hr patch, Place 1/4 to 1/2 of a patch over affected region. Remove and replace once daily.  Slightly alter skin placement daily, Disp: 30 patch, Rfl: 1 .  nitroGLYCERIN (NITROLINGUAL) 0.4 MG/SPRAY spray, Place  1 spray under the tongue every 5 (five) minutes as needed for chest pain., Disp: 12 g, Rfl: 1 .  prasugrel (EFFIENT) 10 MG TABS tablet, Take 1 tablet (10 mg total) by mouth daily., Disp: 90 tablet, Rfl: 3 .  ramipril (ALTACE) 2.5 MG capsule, Take 1 capsule (2.5 mg total) by mouth every morning., Disp: 90 capsule, Rfl: 3 .  ranolazine (RANEXA) 1000 MG SR tablet, Take 1 tablet (1,000 mg total) by mouth 2 (two) times daily., Disp: 180 tablet, Rfl: 1 .  sildenafil (VIAGRA) 100 MG tablet, Take 1 tablet (100 mg total) by mouth daily as needed for erectile dysfunction., Disp: 7 tablet, Rfl: 5 .  sitaGLIPtin (JANUVIA) 100 MG  tablet, Take 1 tablet (100 mg total) by mouth daily., Disp: 90 tablet, Rfl: 4 .  triamcinolone cream (KENALOG) 0.1 %, Apply 1 application topically 2 (two) times daily., Disp: 30 g, Rfl: 0  EXAM: This is a telephone visit and thus no physical exam was completed.  ASSESSMENT AND PLAN:  Discussed the following assessment and plan:  CAD (coronary artery disease) Asymptomatic.  Continue aspirin 81 mg once daily, carvedilol 6.25 mg twice daily, Effient 10 mg once daily, Ranexa 1000 mg twice daily, and ramipril 2.5 mg once daily.  He will come in for lab work.  HTN (hypertension) Well-controlled.  He will continue ramipril 2.5 mg once daily and carvedilol 6.25 mg twice daily.  Type 2 diabetes mellitus, controlled ([place]) Seems to have had improved control.  He will come in for an A1c.  Continue Januvia 100 mg once daily, Metformin 1000 mg twice daily, and Jardiance 25 mg once daily.  I encouraged him to see the eye doctor once a year.  Rash Undetermined cause.  We will trial triamcinolone 4.9% 1 application twice daily for 2 weeks.  If there is no improvement within the first week he will let us know we can refer to dermatology for evaluation.   Orders Placed This Encounter  Procedures  . Basic Metabolic Panel (BMET)    Standing Status:   Future    Standing Expiration Date:    01/24/2021  . HgB A1c    Standing Status:   Future    Standing Expiration Date:   01/24/2021    Meds ordered this encounter  Medications  . triamcinolone cream (KENALOG) 0.1 %    Sig: Apply 1 application topically 2 (two) times daily.    Dispense:  30 g    Refill:  0     I discussed the assessment and treatment plan with the patient. The patient was provided an opportunity to ask questions and all were answered. The patient agreed with the plan and demonstrated an understanding of the instructions.   The patient was advised to call back or seek an in-person evaluation if the symptoms worsen or if the condition fails to improve as anticipated.  I provided 15 minutes of non-face-to-face time during this encounter.   [name], MD

## 2020-01-25 NOTE — Assessment & Plan Note (Signed)
Undetermined cause.  We will trial triamcinolone 5.1% 1 application twice daily for 2 weeks.  If there is no improvement within the first week he will let us know we can refer to dermatology for evaluation.

## 2020-02-02 ENCOUNTER — Other Ambulatory Visit (INDEPENDENT_AMBULATORY_CARE_PROVIDER_SITE_OTHER): Payer: Medicare Other

## 2020-02-02 ENCOUNTER — Other Ambulatory Visit: Payer: Self-pay

## 2020-02-02 DIAGNOSIS — E1159 Type 2 diabetes mellitus with other circulatory complications: Secondary | ICD-10-CM | POA: Diagnosis not present

## 2020-02-02 DIAGNOSIS — I10 Essential (primary) hypertension: Secondary | ICD-10-CM

## 2020-02-02 LAB — BASIC METABOLIC PANEL
BUN: 22 mg/dL (ref 6–23)
CO2: 27 mEq/L (ref 19–32)
Calcium: 9.5 mg/dL (ref 8.4–10.5)
Chloride: 105 mEq/L (ref 96–112)
Creatinine, Ser: 1.02 mg/dL (ref 0.40–1.50)
GFR: 72.37 mL/min (ref >60.00)
Glucose, Bld: 135 mg/dL — ABNORMAL HIGH (ref 70–99)
Potassium: 4.1 mEq/L (ref 3.5–5.1)
Sodium: 138 mEq/L (ref 135–145)

## 2020-02-02 LAB — HEMOGLOBIN A1C: Hgb A1c MFr Bld: 7.8 % — ABNORMAL HIGH (ref 4.6–6.5)

## 2020-04-18 ENCOUNTER — Other Ambulatory Visit: Payer: Self-pay

## 2020-04-18 ENCOUNTER — Encounter: Payer: Self-pay | Admitting: Family

## 2020-04-18 ENCOUNTER — Ambulatory Visit (INDEPENDENT_AMBULATORY_CARE_PROVIDER_SITE_OTHER): Payer: Medicare Other | Admitting: Family

## 2020-04-18 VITALS — BP 130/66 | HR 86 | Ht 68.0 in | Wt 191.0 lb

## 2020-04-18 DIAGNOSIS — I255 Ischemic cardiomyopathy: Secondary | ICD-10-CM | POA: Diagnosis not present

## 2020-04-18 DIAGNOSIS — I25118 Atherosclerotic heart disease of native coronary artery with other forms of angina pectoris: Secondary | ICD-10-CM | POA: Diagnosis not present

## 2020-04-18 DIAGNOSIS — E785 Hyperlipidemia, unspecified: Secondary | ICD-10-CM | POA: Diagnosis not present

## 2020-04-18 DIAGNOSIS — I10 Essential (primary) hypertension: Secondary | ICD-10-CM

## 2020-04-18 DIAGNOSIS — I493 Ventricular premature depolarization: Secondary | ICD-10-CM | POA: Diagnosis not present

## 2020-04-18 MED ORDER — NITROGLYCERIN 0.4 MG/SPRAY TL SOLN: 1.0000 | 1 refills | Status: DC | PRN

## 2020-04-18 NOTE — Progress Notes (Signed)
Office Visit    Patient Name: [name] Date of Encounter: 04/18/2020  Primary Care Provider:  [name], MD Primary Cardiologist:  Isaac Rogue, MD Electrophysiologist:  None   Chief Complaint    [name] is a 69 y.o. male with a hx of CAD, DM2, previous tobacco use, OSA on CPAP, HTN, HLD, PVC, ischemic cardiomyopathy, obesity presents today for follow up of CAD.   Past Medical History    Past Medical History:  Diagnosis Date  . Angina   . Asthma   . Bursitis of left elbow 2018  . CAD (coronary artery disease) 04/12/2011   a. remote MI in 1996; b. MI 1997 s/p stenting to LAD & diag; c. MI 2009: long stenting from pLAD to mid LAD and diag & POBA of jailed diag branch; d. cath 2010: 80% ISR of LAD w/ L-R collats, med Rx; e. cath 2012: 50-60% tub mLAD, 60-70% dLAD, FFR 0.75 -->s/p PCI dissection repaired w/ 2 stents (3 total); f. cath 06/2012: no changes from 2012 cath, med Rx, patent stents   . Diabetes mellitus without complication ([place])   . ED (erectile dysfunction)   . Heart disease   . HTN (hypertension) 04/12/2011  . Hyperlipemia 04/12/2011  . Ischemic cardiomyopathy    a. echo 2010: EF 45-50%, mild to mod ant and apical wall HK, trace MR; b. cardiac cath 06/2012: EF 40%, mild MR   . Myocardial infarction ([place])    x 5  . Obesity   . OSA (obstructive sleep apnea)    on CPAP  . Osteoarthritis   . Renal disorder    kidney stone   Past Surgical History:  Procedure Laterality Date  . CORONARY ANGIOPLASTY     x 5  . CORONARY STENT PLACEMENT     x 7  . LEFT HEART CATHETERIZATION WITH CORONARY ANGIOGRAM N/A 04/14/2011   Procedure: LEFT HEART CATHETERIZATION WITH CORONARY ANGIOGRAM;  Surgeon: Pixie Casino, MD;  Location: [facility] CATH LAB;  Service: Cardiovascular;  Laterality: N/A;  . LEFT HEART CATHETERIZATION WITH CORONARY ANGIOGRAM N/A 06/25/2012   Procedure: LEFT HEART CATHETERIZATION WITH CORONARY ANGIOGRAM;  Surgeon: Peter M Martinique, MD;   Location: [facility] CATH LAB;  Service: Cardiovascular;  Laterality: N/A;  . TONSILLECTOMY AND ADENOIDECTOMY  1955    Allergies  Allergies  Allergen Reactions  . Atarax [Hydroxyzine Hcl]     Weakness, out of this world feeling.  Marland Kitchen Hydroxyzine Hcl Other (See Comments)    Weakness, out of this world feeling. sluggishness  . Latex Rash    Other reaction(s): Other (See Comments) I.e. Elastic= rash to blisters if worn for an extended time Patient show effects after long exposure.    History of Present Illness    [name] is a 69 y.o. male with a hx of CAD, DM2, previous tobacco use, OSA on CPAP, HTN, HLD, PVC, ischemic cardiomyopathy, obesity last seen 04/25/19 by Dr. Rockey Situ.  He has an extensive history of CAD with first MI in 10 and 1997. He had stenting of LAD and diagonal branches in 1997 and repeat stenting in 2009. He is s/p stent to LAD with perfororation in 2012. Of note, per his report he has a total of 7 stents. Most recent cardiac catheterization 2014 per previous documentation. He has been maintained on aspirin, Atorvastatin, Coreg, Effient, and Ranexa.   He presents today for follow-up.  He is doing very well.  He enjoys reading in a spare time.  He  also walks regularly 2 to 4 miles per day without difficulty.  He reports compliance with all patients.  Reports no shortness of breath nor dyspnea on exertion. Reports no chest pain, pressure, or tightness. No edema, orthopnea, PND. Reports no palpitations.   EKGs/Labs/Other Studies Reviewed:   The following studies were reviewed today:  EKG:  EKG is  ordered today.  The ekg ordered today demonstrates NSR 86 bpm with prior septal infarct.  Stable compared to previous.  No acute ST/T wave changes.  Recent Labs: 07/11/2019: ALT 25 02/02/2020: BUN 22; Creatinine, Ser 1.02; Potassium 4.1; Sodium 138  Recent Lipid Panel    Component Value Date/Time   CHOL 125 07/11/2019 1424   CHOL 113 07/31/2015 0840   TRIG 288.0 (H)  07/11/2019 1424   HDL 30.40 (L) 07/11/2019 1424   HDL 41 07/31/2015 0840   CHOLHDL 4 07/11/2019 1424   VLDL 57.6 (H) 07/11/2019 1424   LDLCALC 68 02/04/2018 1115   LDLCALC 57 07/31/2015 0840   LDLDIRECT 71.0 07/11/2019 1424   Home Medications   Current Meds  Medication Sig  . aspirin EC 81 MG tablet Take 1 tablet (81 mg total) by mouth daily.  Marland Kitchen atorvastatin (LIPITOR) 80 MG tablet Take 1 tablet (80 mg total) by mouth daily.  . blood glucose meter kit and supplies KIT Dispense based on patient and insurance preference. Use once daily fasting. (FOR ICD 10 DIAGNOSIS OF E11.59).  . carvedilol (COREG) 6.25 MG tablet Take 1 tablet (6.25 mg total) by mouth 2 (two) times daily with a meal.  . diclofenac sodium (VOLTAREN) 1 % GEL Apply topically to affected area qid  . empagliflozin (JARDIANCE) 25 MG TABS tablet Take 25 mg by mouth daily.  . Lactobacillus (ACIDOPHILUS PROBIOTIC) 100 MG CAPS Take 1 capsule (100 mg total) by mouth daily.  . metFORMIN (GLUCOPHAGE) 1000 MG tablet Take 1 tablet (1,000 mg total) by mouth 2 (two) times daily with a meal.  . Multiple Vitamins-Minerals (MULTIVITAMINS THER. W/MINERALS) TABS Take 1 tablet by mouth every morning.   . niacin (NIASPAN) 1000 MG CR tablet Take 2 tablets by mouth at bedtime daily.  . nitroGLYCERIN (NITRODUR - DOSED IN MG/24 HR) 0.2 mg/hr patch Place 1/4 to 1/2 of a patch over affected region. Remove and replace once daily.  Slightly alter skin placement daily  . nitroGLYCERIN (NITROLINGUAL) 0.4 MG/SPRAY spray Place 1 spray under the tongue every 5 (five) minutes as needed for chest pain.  . prasugrel (EFFIENT) 10 MG TABS tablet Take 1 tablet (10 mg total) by mouth daily.  . ramipril (ALTACE) 2.5 MG capsule Take 1 capsule (2.5 mg total) by mouth every morning.  . ranolazine (RANEXA) 1000 MG SR tablet Take 1 tablet (1,000 mg total) by mouth 2 (two) times daily.  . sildenafil (VIAGRA) 100 MG tablet Take 1 tablet (100 mg total) by mouth daily as  needed for erectile dysfunction.  . sitaGLIPtin (JANUVIA) 100 MG tablet Take 1 tablet (100 mg total) by mouth daily.  Marland Kitchen triamcinolone cream (KENALOG) 0.1 % Apply 1 application topically 2 (two) times daily.     Review of Systems  All other systems reviewed and are otherwise negative except as noted above.  Physical Exam    VS:  BP 130/66 (BP Location: Left Arm, Patient Position: Sitting, Cuff Size: Normal)   Pulse 86   Ht _0  (1.727 m)   Wt 191 lb (86.6 kg)   SpO2 97%   BMI 29.04 kg/m  , BMI Body  mass index is 29.04 kg/m.  Wt Readings from Last 3 Encounters:  04/18/20 191 lb (86.6 kg)  01/25/20 189 lb 9.6 oz (86 kg)  10/18/19 193 lb 6.4 oz (87.7 kg)    GEN: Well nourished, well developed, in no acute distress. HEENT: normal. Neck: Supple, no JVD, carotid bruits, or masses. Cardiac: RRR, no murmurs, rubs, or gallops. No clubbing, cyanosis, edema.  Radials/DP/PT 2+ and equal bilaterally.  Respiratory:  Respirations regular and unlabored, clear to auscultation bilaterally. GI: Soft, nontender, nondistended. MS: No deformity or atrophy. Skin: Warm and dry, no rash. Neuro:  Strength and sensation are intact. Psych: Normal affect.  Assessment & Plan   1. CAD -stable with no aingal symptoms.  GDMT includes DAPT aspirin/Effient, Coreg, Ranexa, atorvastatin.  Denies bleeding complications on DAPT and due to extensive stenting of the LAD will continue DAPT.  Continue regular cardiovascular exercise.  Continue low-sodium heart healthy diet.  EKG today with no acute ST/T wave changes, known prior septal infarct.  2. HTN - BP well controlled. Continue current antihypertensive regimen.   3. Ischemic cardiomyopathy - Euvolemic and well compensated on exam.  Echo 2010 with LVEF 45 to 50%.  As he is doing well we will defer repeat echocardiogram this time.  4. HLD - Lipid panel 07/2019 with LDL 71, triglycerides 288.  Continue atorvastatin 80 mg daily.  Anticipate he will have repeat lipid  panel with his primary care at upcoming annual visit.  If triglycerides remain elevated could consider addition of Vascepa for cardiovascular risk reduction.  5. DM2 - 02/02/20 A1c 7.8.  Continue to follow with PCP.  6. PVC -previous history.  None noted on EKG today.  Denies palpitations.  Continue Coreg 6.25 mg twice daily.  Disposition: Follow up in 1 year(s) with Dr. Rockey Situ or APP   Signed, Loel Dubonnet, NP 04/18/2020, 8:50 AM [place]

## 2020-04-18 NOTE — Patient Instructions (Addendum)
Medication Instructions:  No medication changes today.   *If you need a refill on your cardiac medications before your next appointment, please call your pharmacy*   Lab Work: None ordered today.   Testing/Procedures: Your EKG today looked great!  Follow-Up: At [facility], you and your health needs are our priority.  As part of our continuing mission to provide you with exceptional heart care, we have created designated Provider Care Teams.  These Care Teams include your primary Cardiologist (physician) and Advanced Practice Providers (APPs -  Physician Assistants and Nurse Practitioners) who all work together to provide you with the care you need, when you need it.   Your next appointment:   1 year(s)  The format for your next appointment:   In Person  Provider:   You may see [name], MD or one of the following Advanced Practice Providers on your designated Care Team:    Murray Hodgkins, NP  Christell Faith, PA-C  Marrianne Mood, PA-C  Cadence [place], [place]  Laurann Montana, NP    Other Instructions

## 2020-04-24 DIAGNOSIS — Z23 Encounter for immunization: Secondary | ICD-10-CM | POA: Diagnosis not present

## 2020-05-25 ENCOUNTER — Other Ambulatory Visit: Payer: Self-pay

## 2020-05-29 ENCOUNTER — Telehealth (INDEPENDENT_AMBULATORY_CARE_PROVIDER_SITE_OTHER): Payer: Medicare Other | Admitting: Family Medicine

## 2020-05-29 ENCOUNTER — Encounter: Payer: Self-pay | Admitting: Family Medicine

## 2020-05-29 ENCOUNTER — Other Ambulatory Visit: Payer: Self-pay

## 2020-05-29 DIAGNOSIS — E1159 Type 2 diabetes mellitus with other circulatory complications: Secondary | ICD-10-CM

## 2020-05-29 DIAGNOSIS — I10 Essential (primary) hypertension: Secondary | ICD-10-CM

## 2020-05-29 DIAGNOSIS — Z860100 Personal history of colon polyps, unspecified: Secondary | ICD-10-CM

## 2020-05-29 DIAGNOSIS — I2510 Atherosclerotic heart disease of native coronary artery without angina pectoris: Secondary | ICD-10-CM

## 2020-05-29 DIAGNOSIS — R21 Rash and other nonspecific skin eruption: Secondary | ICD-10-CM

## 2020-05-29 DIAGNOSIS — E7800 Pure hypercholesterolemia, unspecified: Secondary | ICD-10-CM

## 2020-05-29 NOTE — Assessment & Plan Note (Signed)
Uncontrolled.  Discussed the potential of changing Januvia to Ozempic or Trulicity.  The patient is very hesitant to do an injection at this time.  Discussed that we will check his A1c first and then consider making this change.  He will continue metformin, Jardiance, and Januvia at this time.

## 2020-05-29 NOTE — Assessment & Plan Note (Signed)
Discussed that the patient could start on co-Q10 with his statin.

## 2020-05-29 NOTE — Assessment & Plan Note (Signed)
Asymptomatic.  Continue aspirin, Effient, ramipril, Ranexa, carvedilol, Lipitor.

## 2020-05-29 NOTE — Progress Notes (Addendum)
Virtual Visit via telephone Note  This visit type was conducted due to national recommendations for restrictions regarding the COVID-19 pandemic (e.g. social distancing).  This format is felt to be most appropriate for this patient at this time.  All issues noted in this document were discussed and addressed.  No physical exam was performed (except for noted visual exam findings with Video Visits).   I connected with [name] today at  9:00 AM EST by telephone and verified that I am speaking with the correct person using two identifiers. Location patient: truck Location provider: home office Persons participating in the virtual visit: patient, provider  I discussed the limitations, risks, security and privacy concerns of performing an evaluation and management service by telephone and the availability of in person appointments. I also discussed with the patient that there may be a patient responsible charge related to this service. The patient expressed understanding and agreed to proceed.  Interactive audio and video telecommunications were attempted between this provider and patient, however failed, due to patient having technical difficulties OR patient did not have access to video capability.  We continued and completed visit with audio only.   Reason for visit: f/u  HPI: HYPERTENSION/CAD  Disease Monitoring  Home BP Monitoring 124/78 Chest pain- no    Dyspnea- no Medications  Compliance-  Taking coreg, ramipril, ranexa, lipitor, Aspirin, effient.  Edema- no  DIABETES Disease Monitoring: Blood Sugar ranges-115-148 Polyuria/phagia/dipsia- no      Optho- due Medications: Compliance- taking metformin, jardiance, januvia Hypoglycemic symptoms- rare if he does not eat lunch  Scalp rash: This has been going on intermittently for a year.  Started out with little spots and then they have gotten bigger.  Notes they are scaly gray/white stuff.  No itching.  History of colon polyps:  Patient deferred to colonoscopy last year.  Discussed that 3 of his polyps were precancerous and that it is recommended that he have a follow-up colonoscopy.  The patient wondered about taking co-Q10 given that he has been on a statin for about 20 years.    ROS: See pertinent positives and negatives per HPI.  Past Medical History:  Diagnosis Date  . Angina   . Asthma   . Bursitis of left elbow 2018  . CAD (coronary artery disease) 04/12/2011   a. remote MI in 1996; b. MI 1997 s/p stenting to LAD & diag; c. MI 2009: long stenting from pLAD to mid LAD and diag & POBA of jailed diag branch; d. cath 2010: 80% ISR of LAD w/ L-R collats, med Rx; e. cath 2012: 50-60% tub mLAD, 60-70% dLAD, FFR 0.75 -->s/p PCI dissection repaired w/ 2 stents (3 total); f. cath 06/2012: no changes from 2012 cath, med Rx, patent stents   . Diabetes mellitus without complication ([place])   . ED (erectile dysfunction)   . Heart disease   . HTN (hypertension) 04/12/2011  . Hyperlipemia 04/12/2011  . Ischemic cardiomyopathy    a. echo 2010: EF 45-50%, mild to mod ant and apical wall HK, trace MR; b. cardiac cath 06/2012: EF 40%, mild MR   . Myocardial infarction ([place])    x 5  . Obesity   . OSA (obstructive sleep apnea)    on CPAP  . Osteoarthritis   . Renal disorder    kidney stone    Past Surgical History:  Procedure Laterality Date  . CORONARY ANGIOPLASTY     x 5  . CORONARY STENT PLACEMENT     x  7  . LEFT HEART CATHETERIZATION WITH CORONARY ANGIOGRAM N/A 04/14/2011   Procedure: LEFT HEART CATHETERIZATION WITH CORONARY ANGIOGRAM;  Surgeon: Pixie Casino, MD;  Location: [facility] CATH LAB;  Service: Cardiovascular;  Laterality: N/A;  . LEFT HEART CATHETERIZATION WITH CORONARY ANGIOGRAM N/A 06/25/2012   Procedure: LEFT HEART CATHETERIZATION WITH CORONARY ANGIOGRAM;  Surgeon: Peter M Martinique, MD;  Location: [facility] CATH LAB;  Service: Cardiovascular;  Laterality: N/A;  . TONSILLECTOMY AND ADENOIDECTOMY  1955    Family  History  Problem Relation Age of Onset  . COPD Mother   . Other Father        muscle tumor  . Stroke Father   . Prostate cancer Father        mets  . Heart disease Father   . Alcoholism Father   . Stroke Sister   . Heart block Sister        Heart bypass  . Bladder Cancer Neg Hx   . Kidney cancer Neg Hx     SOCIAL HX: Former smoker   Current Outpatient Medications:  .  aspirin EC 81 MG tablet, Take 1 tablet (81 mg total) by mouth daily., Disp: 90 tablet, Rfl: 3 .  atorvastatin (LIPITOR) 80 MG tablet, Take 1 tablet (80 mg total) by mouth daily., Disp: 90 tablet, Rfl: 1 .  blood glucose meter kit and supplies KIT, Dispense based on patient and insurance preference. Use once daily fasting. (FOR ICD 10 DIAGNOSIS OF E11.59)., Disp: 1 each, Rfl: 0 .  carvedilol (COREG) 6.25 MG tablet, Take 1 tablet (6.25 mg total) by mouth 2 (two) times daily with a meal., Disp: 180 tablet, Rfl: 1 .  diclofenac sodium (VOLTAREN) 1 % GEL, Apply topically to affected area qid, Disp: 100 g, Rfl: 1 .  empagliflozin (JARDIANCE) 25 MG TABS tablet, Take 25 mg by mouth daily., Disp: 90 tablet, Rfl: 4 .  ketoconazole (NIZORAL) 2 % shampoo, Apply 1 application topically 2 (two) times a week., Disp: 100 mL, Rfl: 0 .  Lactobacillus (ACIDOPHILUS PROBIOTIC) 100 MG CAPS, Take 1 capsule (100 mg total) by mouth daily., Disp: 15 capsule, Rfl: 0 .  metFORMIN (GLUCOPHAGE) 1000 MG tablet, Take 1 tablet (1,000 mg total) by mouth 2 (two) times daily with a meal., Disp: 180 tablet, Rfl: 3 .  Multiple Vitamins-Minerals (MULTIVITAMINS THER. W/MINERALS) TABS, Take 1 tablet by mouth every morning., Disp: , Rfl:  .  niacin (NIASPAN) 1000 MG CR tablet, Take 2 tablets by mouth at bedtime daily., Disp: 180 tablet, Rfl: 1 .  nitroGLYCERIN (NITROLINGUAL) 0.4 MG/SPRAY spray, Place 1 spray under the tongue every 5 (five) minutes as needed for chest pain., Disp: 12 g, Rfl: 1 .  prasugrel (EFFIENT) 10 MG TABS tablet, Take 1 tablet (10 mg total)  by mouth daily., Disp: 90 tablet, Rfl: 3 .  ramipril (ALTACE) 2.5 MG capsule, Take 1 capsule (2.5 mg total) by mouth every morning., Disp: 90 capsule, Rfl: 3 .  ranolazine (RANEXA) 1000 MG SR tablet, Take 1 tablet (1,000 mg total) by mouth 2 (two) times daily., Disp: 180 tablet, Rfl: 1 .  sildenafil (VIAGRA) 100 MG tablet, Take 1 tablet (100 mg total) by mouth daily as needed for erectile dysfunction., Disp: 7 tablet, Rfl: 5 .  sitaGLIPtin (JANUVIA) 100 MG tablet, Take 1 tablet (100 mg total) by mouth daily., Disp: 90 tablet, Rfl: 4 .  triamcinolone cream (KENALOG) 0.1 %, Apply 1 application topically 2 (two) times daily., Disp: 30 g, Rfl: 0  EXAM: This  was a telephone visit and thus no physical exam was completed.  ASSESSMENT AND PLAN:  Discussed the following assessment and plan:  Problem List Items Addressed This Visit    CAD (coronary artery disease) (Chronic)    Asymptomatic.  Continue aspirin, Effient, ramipril, Ranexa, carvedilol, Lipitor.      History of colon polyps    I encouraged the patient to contact his GI physician to set up a colonoscopy.  Discussed the risk of progression of precancerous polyps to a colon cancer if left in place.  He will consider contacting GI.      HTN (hypertension) (Chronic)    Adequate control.  Continue carvedilol and ramipril.      Hyperlipemia (Chronic)    Discussed that the patient could start on co-Q10 with his statin.      Rash    Rash on scalp could be related to seborrheic dermatitis.  We will trial ketoconazole shampoo.  Also refer to dermatology.      Relevant Medications   ketoconazole (NIZORAL) 2 % shampoo   Other Relevant Orders   Ambulatory referral to Dermatology   Type 2 diabetes mellitus, controlled ([place])    Uncontrolled.  Discussed the potential of changing Januvia to Ozempic or Trulicity.  The patient is very hesitant to do an injection at this time.  Discussed that we will check his A1c first and then consider making  this change.  He will continue metformin, Jardiance, and Januvia at this time.         Health maintenance: The patient reports he is unsure if he had his flu vaccine.  He will check with his pharmacy.  He reports having had 3 doses of the COVID-19 vaccine.  He will schedule with his eye doctor as well.   I discussed the assessment and treatment plan with the patient. The patient was provided an opportunity to ask questions and all were answered. The patient agreed with the plan and demonstrated an understanding of the instructions.   The patient was advised to call back or seek an in-person evaluation if the symptoms worsen or if the condition fails to improve as anticipated.  I provided 21 minutes of non-face-to-face time during this encounter.   [name], MD

## 2020-05-29 NOTE — Assessment & Plan Note (Signed)
I encouraged the patient to contact his GI physician to set up a colonoscopy.  Discussed the risk of progression of precancerous polyps to a colon cancer if left in place.  He will consider contacting GI.

## 2020-05-29 NOTE — Assessment & Plan Note (Signed)
Rash on scalp could be related to seborrheic dermatitis.  We will trial ketoconazole shampoo.  Also refer to dermatology.

## 2020-05-29 NOTE — Assessment & Plan Note (Signed)
Adequate control.  Continue carvedilol and ramipril.

## 2020-06-05 ENCOUNTER — Encounter: Payer: Self-pay | Admitting: *Deleted

## 2020-06-06 ENCOUNTER — Ambulatory Visit (INDEPENDENT_AMBULATORY_CARE_PROVIDER_SITE_OTHER): Payer: Medicare Other

## 2020-06-06 VITALS — Ht 68.0 in | Wt 195.0 lb

## 2020-06-06 DIAGNOSIS — Z0000 Encounter for general adult medical examination without abnormal findings: Secondary | ICD-10-CM | POA: Diagnosis not present

## 2020-06-06 DIAGNOSIS — Z1211 Encounter for screening for malignant neoplasm of colon: Secondary | ICD-10-CM | POA: Diagnosis not present

## 2020-06-06 NOTE — Patient Instructions (Addendum)
[name] , Thank you for taking time to come for your Medicare Wellness Visit. I appreciate your ongoing commitment to your health goals. Please review the following plan we discussed and let me know if I can assist you in the future.   These are the goals we discussed: Goals      Patient Stated   .  DIET - REDUCE PORTION SIZE (pt-stated)      Low carb foods Lose weight       This is a list of the screening recommended for you and due dates:  Health Maintenance  Topic Date Due  . Colon Cancer Screening  11/08/2016  . Pneumonia vaccines (2 of 2 - PPSV23) 06/04/2019  . Eye exam for diabetics  11/24/2019  . Complete foot exam   12/25/2019  . Flu Shot  08/09/2020*  . Hemoglobin A1C  08/01/2020  . COVID-19 Vaccine (4 - Booster for Moderna series) 09/09/2020  . Tetanus Vaccine  02/25/2021  .  Hepatitis C: One time screening is recommended by Center for Disease Control  (CDC) for  adults born from [birth year] through 1965.   Completed  *Topic was postponed. The date shown is not the original due date.    Colonoscopy, Adult A colonoscopy is a procedure to look at the entire large intestine. This procedure is done using a long, thin, flexible tube that has a camera on the end. You may have a colonoscopy:  As a part of normal colorectal screening.  If you have certain symptoms, such as: ? A low number of red blood cells in your blood (anemia). ? Diarrhea that does not go away. ? Pain in your abdomen. ? Blood in your stool. A colonoscopy can help screen for and diagnose medical problems, including:  Tumors.  Extra tissue that grows where mucus forms (polyps).  Inflammation.  Areas of bleeding. Tell your health care provider about:  Any allergies you have.  All medicines you are taking, including vitamins, herbs, eye drops, creams, and over-the-counter medicines.  Any problems you or family members have had with anesthetic medicines.  Any blood disorders you have.  Any  surgeries you have had.  Any medical conditions you have.  Any problems you have had with having bowel movements.  Whether you are pregnant or may be pregnant. What are the risks? Generally, this is a safe procedure. However, problems may occur, including:  Bleeding.  Damage to your intestine.  Allergic reactions to medicines given during the procedure.  Infection. This is rare. What happens before the procedure? Eating and drinking restrictions Follow instructions from your health care provider about eating or drinking restrictions, which may include:  A few days before the procedure: ? Follow a low-fiber diet. ? Avoid nuts, seeds, dried fruit, raw fruits, and vegetables.  1-3 days before the procedure: ? Eat only gelatin dessert or ice pops. ? Drink only clear liquids, such as water, clear juice, clear broth or bouillon, black coffee or tea, or clear soft drinks or sports drinks. ? Avoid liquids that contain red or purple dye.  The day of the procedure: ? Do not eat solid foods. You may continue to drink clear liquids until up to 2 hours before the procedure. ? Do not eat or drink anything starting 2 hours before the procedure, or within the time period that your health care provider recommends. Bowel prep If you were prescribed a bowel prep to take by mouth (orally) to clean out your colon:  Take it as  told by your health care provider. Starting the day before your procedure, you will need to drink a large amount of liquid medicine. The liquid will cause you to have many bowel movements of loose stool until your stool becomes almost clear or light green.  If your skin or the opening between the buttocks (anus) gets irritated from diarrhea, you may relieve the irritation using: ? Wipes with medicine in them, such as adult wet wipes with aloe and vitamin E. ? A product to soothe skin, such as petroleum jelly.  If you vomit while drinking the bowel prep: ? Take a break for  up to 60 minutes. ? Begin the bowel prep again. ? Call your health care provider if you keep vomiting or you cannot take the bowel prep without vomiting.  To clean out your colon, you may also be given: ? Laxative medicines. These help you have a bowel movement. ? Instructions for enema use. An enema is liquid medicine injected into your rectum. Medicines Ask your health care provider about:  Changing or stopping your regular medicines or supplements. This is especially important if you are taking iron supplements, diabetes medicines, or blood thinners.  Taking medicines such as aspirin and ibuprofen. These medicines can thin your blood. Do not take these medicines unless your health care provider tells you to take them.  Taking over-the-counter medicines, vitamins, herbs, and supplements. General instructions  Ask your health care provider what steps will be taken to help prevent infection. These may include washing skin with a germ-killing soap.  Plan to have someone take you home from the hospital or clinic. What happens during the procedure?  An IV will be inserted into one of your veins.  You may be given one or more of the following: ? A medicine to help you relax (sedative). ? A medicine to numb the area (local anesthetic). ? A medicine to make you fall asleep (general anesthetic). This is rarely needed.  You will lie on your side with your knees bent.  The tube will: ? Have oil or gel put on it (be lubricated). ? Be inserted into your anus. ? Be gently eased through all parts of your large intestine.  Air will be sent into your colon to keep it open. This may cause some pressure or cramping.  Images will be taken with the camera and will appear on a screen.  A small tissue sample may be removed to be looked at under a microscope (biopsy). The tissue may be sent to a lab for testing if any signs of problems are found.  If small polyps are found, they may be removed  and checked for cancer cells.  When the procedure is finished, the tube will be removed. The procedure may vary among health care providers and hospitals.   What happens after the procedure?  Your blood pressure, heart rate, breathing rate, and blood oxygen level will be monitored until you leave the hospital or clinic.  You may have a small amount of blood in your stool.  You may pass gas and have mild cramping or bloating in your abdomen. This is caused by the air that was used to open your colon during the exam.  Do not drive for 24 hours after the procedure.  It is up to you to get the results of your procedure. Ask your health care provider, or the department that is doing the procedure, when your results will be ready. Summary  A colonoscopy is a  procedure to look at the entire large intestine.  Follow instructions from your health care provider about eating and drinking before the procedure.  If you were prescribed an oral bowel prep to clean out your colon, take it as told by your health care provider.  During the colonoscopy, a flexible tube with a camera on its end is inserted into the anus and then passed into the other parts of the large intestine. This information is not intended to replace advice given to you by your health care provider. Make sure you discuss any questions you have with your health care provider. Document Revised: 11/19/2018 Document Reviewed: 11/19/2018 Elsevier Patient Education  [place].

## 2020-06-06 NOTE — Progress Notes (Signed)
Subjective:   [name] is a 70 y.o. male who presents for Medicare Annual/Subsequent preventive examination.  Review of Systems    No ROS.  Medicare Wellness Virtual Visit.     Cardiac Risk Factors include: advanced age (>58mn, >>16women);male gender;hypertension;diabetes mellitus     Objective:    Today's Vitals   06/06/20 0838  Weight: 195 lb (88.5 kg)  Height: '5\' 8"'  (1.727 m)   Body mass index is 29.65 kg/m.  Advanced Directives 06/06/2020 09/16/2019 06/12/2019 06/06/2019 06/03/2018 05/22/2018 04/22/2015  Does Patient Have a Medical Advance Directive? No No No No No No No  Would patient like information on creating a medical advance directive? No - Patient declined - - Yes (MAU/Ambulatory/Procedural Areas - Information given) Yes (MAU/Ambulatory/Procedural Areas - Information given) No - Patient declined -  Pre-existing out of facility DNR order (yellow form or pink MOST form) - - - - - - -    Current Medications (verified) Outpatient Encounter Medications as of 06/06/2020  Medication Sig  . aspirin EC 81 MG tablet Take 1 tablet (81 mg total) by mouth daily.  .Marland Kitchenatorvastatin (LIPITOR) 80 MG tablet Take 1 tablet (80 mg total) by mouth daily.  . blood glucose meter kit and supplies KIT Dispense based on patient and insurance preference. Use once daily fasting. (FOR ICD 10 DIAGNOSIS OF E11.59).  . carvedilol (COREG) 6.25 MG tablet Take 1 tablet (6.25 mg total) by mouth 2 (two) times daily with a meal.  . diclofenac sodium (VOLTAREN) 1 % GEL Apply topically to affected area qid  . empagliflozin (JARDIANCE) 25 MG TABS tablet Take 25 mg by mouth daily.  . Lactobacillus (ACIDOPHILUS PROBIOTIC) 100 MG CAPS Take 1 capsule (100 mg total) by mouth daily.  . metFORMIN (GLUCOPHAGE) 1000 MG tablet Take 1 tablet (1,000 mg total) by mouth 2 (two) times daily with a meal.  . Multiple Vitamins-Minerals (MULTIVITAMINS THER. W/MINERALS) TABS Take 1 tablet by mouth every morning.  .  niacin (NIASPAN) 1000 MG CR tablet Take 2 tablets by mouth at bedtime daily.  . nitroGLYCERIN (NITROLINGUAL) 0.4 MG/SPRAY spray Place 1 spray under the tongue every 5 (five) minutes as needed for chest pain.  . prasugrel (EFFIENT) 10 MG TABS tablet Take 1 tablet (10 mg total) by mouth daily.  . ramipril (ALTACE) 2.5 MG capsule Take 1 capsule (2.5 mg total) by mouth every morning.  . ranolazine (RANEXA) 1000 MG SR tablet Take 1 tablet (1,000 mg total) by mouth 2 (two) times daily.  . sildenafil (VIAGRA) 100 MG tablet Take 1 tablet (100 mg total) by mouth daily as needed for erectile dysfunction.  . sitaGLIPtin (JANUVIA) 100 MG tablet Take 1 tablet (100 mg total) by mouth daily.  .Marland Kitchentriamcinolone cream (KENALOG) 0.1 % Apply 1 application topically 2 (two) times daily.   No facility-administered encounter medications on file as of 06/06/2020.    Allergies (verified) Atarax [hydroxyzine hcl], Hydroxyzine hcl, and Latex   History: Past Medical History:  Diagnosis Date  . Angina   . Asthma   . Bursitis of left elbow 2018  . CAD (coronary artery disease) 04/12/2011   a. remote MI in 1996; b. MI 1997 s/p stenting to LAD & diag; c. MI 2009: long stenting from pLAD to mid LAD and diag & POBA of jailed diag branch; d. cath 2010: 80% ISR of LAD w/ L-R collats, med Rx; e. cath 2012: 50-60% tub mLAD, 60-70% dLAD, FFR 0.75 -->s/p PCI dissection repaired w/ 2 stents (  3 total); f. cath 06/2012: no changes from 2012 cath, med Rx, patent stents   . Diabetes mellitus without complication ([place])   . ED (erectile dysfunction)   . Heart disease   . HTN (hypertension) 04/12/2011  . Hyperlipemia 04/12/2011  . Ischemic cardiomyopathy    a. echo 2010: EF 45-50%, mild to mod ant and apical wall HK, trace MR; b. cardiac cath 06/2012: EF 40%, mild MR   . Myocardial infarction ([place])    x 5  . Obesity   . OSA (obstructive sleep apnea)    on CPAP  . Osteoarthritis   . Renal disorder    kidney stone   Past Surgical  History:  Procedure Laterality Date  . CORONARY ANGIOPLASTY     x 5  . CORONARY STENT PLACEMENT     x 7  . LEFT HEART CATHETERIZATION WITH CORONARY ANGIOGRAM N/A 04/14/2011   Procedure: LEFT HEART CATHETERIZATION WITH CORONARY ANGIOGRAM;  Surgeon: Pixie Casino, MD;  Location: [facility] CATH LAB;  Service: Cardiovascular;  Laterality: N/A;  . LEFT HEART CATHETERIZATION WITH CORONARY ANGIOGRAM N/A 06/25/2012   Procedure: LEFT HEART CATHETERIZATION WITH CORONARY ANGIOGRAM;  Surgeon: Peter M Martinique, MD;  Location: [facility] CATH LAB;  Service: Cardiovascular;  Laterality: N/A;  . TONSILLECTOMY AND ADENOIDECTOMY  1955   Family History  Problem Relation Age of Onset  . COPD Mother   . Other Father        muscle tumor  . Stroke Father   . Prostate cancer Father        mets  . Heart disease Father   . Alcoholism Father   . Stroke Sister   . Heart block Sister        Heart bypass  . Bladder Cancer Neg Hx   . Kidney cancer Neg Hx    Social History   Socioeconomic History  . Marital status: Married    Spouse name: Not on file  . Number of children: 2  . Years of education: Not on file  . Highest education level: Not on file  Occupational History  . Occupation: [employer]-retired  . Occupation: Sales  Tobacco Use  . Smoking status: Former Smoker    Types: Cigarettes    Quit date: 09/14/2007    Years since quitting: 12.7  . Smokeless tobacco: Never Used  . Tobacco comment: quit 2009  Vaping Use  . Vaping Use: Never used  Substance and Sexual Activity  . Alcohol use: No    Alcohol/week: 0.0 standard drinks    Comment: rarely 1 beer in last 5 years  . Drug use: No    Types: Marijuana    Comment: pt stopped fall 2012  . Sexual activity: Never  Other Topics Concern  . Not on file  Social History Narrative   Retired twice    Working with rescue dogs    Lives with girlfriend   Pet: 1 snake ([place] snake), 3 dogs    Caffeine- coffee 3 cups a day, tea- unsweet    Social Determinants of  Health   Financial Resource Strain: Low Risk   . Difficulty of Paying Living Expenses: Not hard at all  Food Insecurity: No Food Insecurity  . Worried About Charity fundraiser in the Last Year: Never true  . Ran Out of Food in the Last Year: Never true  Transportation Needs: No Transportation Needs  . Lack of Transportation (Medical): No  . Lack of Transportation (Non-Medical): No  Physical Activity: Sufficiently Active  .  Days of Exercise per Week: 4 days  . Minutes of Exercise per Session: 60 min  Stress: No Stress Concern Present  . Feeling of Stress : Not at all  Social Connections: Unknown  . Frequency of Communication with Friends and Family: Not on file  . Frequency of Social Gatherings with Friends and Family: Not on file  . Attends Religious Services: Not on file  . Active Member of Clubs or Organizations: Not on file  . Attends Archivist Meetings: Not on file  . Marital Status: Married    Tobacco Counseling Counseling given: Not Answered Comment: quit 2009   Clinical Intake:  Pre-visit preparation completed: Yes        Diabetes: Yes (Followed by pcp)  How often do you need to have someone help you when you read instructions, pamphlets, or other written materials from your doctor or pharmacy?: 1 - Never  Nutrition Risk Assessment: Has the patient had any N/V/D within the last 2 months?  No  Does the patient have any non-healing wounds?  No  Has the patient had any unintentional weight loss or weight gain?  No   Diabetes: If diabetic, was a CBG obtained today?  No  Did the patient bring in their glucometer from home?  No  How often do you monitor your CBG's? Daily.   Financial Strains and Diabetes Management: Are you having any financial strains with the device, your supplies or your medication? No .  Does the patient want to be seen by Chronic Care Management for management of their diabetes?  No  Would the patient like to be referred to a  Nutritionist or for Diabetic Management?  No   Diabetic Exams: Eye exam- plans to schedule Foot exam- plans to schedule  Interpreter Needed?: No      Activities of Daily Living In your present state of health, do you have any difficulty performing the following activities: 06/06/2020  Hearing? N  Vision? N  Difficulty concentrating or making decisions? N  Walking or climbing stairs? N  Dressing or bathing? N  Doing errands, shopping? N  Preparing Food and eating ? N  Using the Toilet? N  In the past six months, have you accidently leaked urine? N  Do you have problems with loss of bowel control? N  Managing your Medications? N  Managing your Finances? N  Housekeeping or managing your Housekeeping? N  Some recent data might be hidden    Patient Care Team: [name], MD as PCP - General (Family Medicine) Rockey Situ Kathlene November, MD as PCP - Cardiology (Cardiology) Minna Merritts, MD as Consulting Physician (Cardiology) [name], MD as Consulting Physician (Family Medicine) Christene Lye, MD (General Surgery)  Indicate any recent Medical Services you may have received from other than [facility] providers in the past year (date may be approximate).     Assessment:   This is a routine wellness examination for [name].  I connected with [name] today by telephone and verified that I am speaking with the correct person using two identifiers. Location patient: home Location provider: work Persons participating in the virtual visit: patient, Marine scientist.    I discussed the limitations, risks, security and privacy concerns of performing an evaluation and management service by telephone and the availability of in person appointments. The patient expressed understanding and verbally consented to this telephonic visit.    Interactive audio and video telecommunications were attempted between this provider and patient, however failed, due to patient  having technical  difficulties OR patient did not have access to video capability.  We continued and completed visit with audio only.  Some vital signs may be absent or patient reported.   Hearing- demonstrates normal hearing during visit.   Dietary issues and exercise activities discussed: Current Exercise Habits: Home exercise routine, Type of exercise: walking, Time (Minutes): 60, Frequency (Times/Week): 5, Weekly Exercise (Minutes/Week): 300, Intensity: Moderate   Healthy diet  Goals      Patient Stated   .  DIET - REDUCE PORTION SIZE (pt-stated)      Low carb foods Lose weight      Depression Screen PHQ 2/9 Scores 06/06/2020 05/29/2020 10/18/2019 06/06/2019 06/03/2018 02/18/2018 05/15/2016  PHQ - 2 Score 0 0 0 0 0 0 0    Fall Risk Fall Risk  06/06/2020 05/29/2020 10/18/2019 06/06/2019 06/03/2018  Falls in the past year? 0 0 0 0 0  Comment - - - - -  Number falls in past yr: 0 0 0 - -  Comment - - - - -  Injury with Fall? 0 - - - -  Follow up Falls evaluation completed Falls evaluation completed Falls evaluation completed Falls evaluation completed -    FALL RISK PREVENTION PERTAINING TO THE HOME: Handrails in use when climbing stairs? Yes Home free of loose throw rugs in walkways, pet beds, electrical cords, etc? Yes  Adequate lighting in your home to reduce risk of falls? Yes   ASSISTIVE DEVICES UTILIZED TO PREVENT FALLS: Life alert? No  Use of a cane, walker or w/c? No   TIMED UP AND GO: Was the test performed? No . Virtual visit.  Cognitive Function: Patient is alert and oriented x3.  Denies difficulty focusing, making decisions, memory loss.  Enjoys reading and watching jeopardy.  MMSE/6CIT deferred. Normal by direct communication/observation.  MMSE - Mini Mental State Exam 06/03/2018  Orientation to time 5  Orientation to Place 5  Registration 3  Attention/ Calculation 5  Recall 3  Language- name 2 objects 2  Language- repeat 1  Language- follow 3 step command 3  Language- read &  follow direction 1  Write a sentence 1  Copy design 1  Total score 30     6CIT Screen 06/06/2019  What Year? 0 points  What month? 0 points  What time? 0 points  Count back from 20 0 points  Months in reverse 0 points  Repeat phrase 0 points  Total Score 0    Immunizations Immunization History  Administered Date(s) Administered  . Influenza, High Dose Seasonal PF 02/07/2016, 01/26/2018  . Influenza,inj,Quad PF,6+ Mos 03/13/2014, 02/26/2015  . Moderna Sars-Covid-2 Vaccination 08/08/2019, 09/05/2019, 03/12/2020  . Pneumococcal Conjugate-13 06/03/2018  . Zoster 05/13/2011   Health Maintenance Health Maintenance  Topic Date Due  . COLONOSCOPY (Pts 45-43yr Insurance coverage will need to be confirmed)  11/08/2016  . PNA vac Low Risk Adult (2 of 2 - PPSV23) 06/04/2019  . OPHTHALMOLOGY EXAM  11/24/2019  . FOOT EXAM  12/25/2019  . INFLUENZA VACCINE  08/09/2020 (Originally 12/11/2019)  . HEMOGLOBIN A1C  08/01/2020  . COVID-19 Vaccine (4 - Booster for Moderna series) 09/09/2020  . TETANUS/TDAP  02/25/2021  . Hepatitis C Screening  Completed    Colorectal cancer screening: Type of screening: Colonoscopy. Completed 11/08/13. Repeat every 3 years. Ordered per consent.   DG Chest 2 view. Last 08/14/17.  Hepatitis C Screening: Completed 05/27/16.  Dental Screening: Recommended annual dental exams for proper oral hygiene.  Community Resource Referral /  Chronic Care Management: CRR required this visit?  No   CCM required this visit?  No      Plan:   Keep all routine maintenance appointments.   I have personally reviewed and noted the following in the patient's chart:   . Medical and social history . Use of alcohol, tobacco or illicit drugs  . Current medications and supplements . Functional ability and status . Nutritional status . Physical activity . Advanced directives . List of other physicians . Hospitalizations, surgeries, and ER visits in previous 12  months . Vitals . Screenings to include cognitive, depression, and falls . Referrals and appointments  In addition, I have reviewed and discussed with patient certain preventive protocols, quality metrics, and best practice recommendations. A written personalized care plan for preventive services as well as general preventive health recommendations were provided to patient via mychart.     Varney Biles, LPN   1/95/0932

## 2020-06-13 ENCOUNTER — Telehealth: Payer: Self-pay

## 2020-06-13 NOTE — Telephone Encounter (Signed)
Noted. I referred him to Dermatology already. Please see what local pharmacy he wants the shampoo sent to.

## 2020-06-13 NOTE — Progress Notes (Signed)
To the mail order.  Camrynn Mcclintic,cma

## 2020-06-14 MED ORDER — KETOCONAZOLE 2 % EX SHAM: 1.0000 "application " | MEDICATED_SHAMPOO | CUTANEOUS | 0 refills | Status: DC

## 2020-06-14 NOTE — Telephone Encounter (Signed)
[facility]  Cherita Hebel,cma

## 2020-06-14 NOTE — Telephone Encounter (Signed)
Sent to pharmacy 

## 2020-06-14 NOTE — Addendum Note (Signed)
Addended by: [name] on: 06/14/2020 09:44 AM   Modules accepted: Orders

## 2020-08-29 ENCOUNTER — Other Ambulatory Visit: Payer: Self-pay | Admitting: Family Medicine

## 2020-09-06 ENCOUNTER — Other Ambulatory Visit: Payer: Self-pay

## 2020-09-06 ENCOUNTER — Other Ambulatory Visit: Payer: Self-pay | Admitting: Dermatology

## 2020-09-06 ENCOUNTER — Ambulatory Visit (INDEPENDENT_AMBULATORY_CARE_PROVIDER_SITE_OTHER): Payer: Medicare Other | Admitting: Dermatology

## 2020-09-06 DIAGNOSIS — L570 Actinic keratosis: Secondary | ICD-10-CM

## 2020-09-06 DIAGNOSIS — L814 Other melanin hyperpigmentation: Secondary | ICD-10-CM

## 2020-09-06 DIAGNOSIS — L820 Inflamed seborrheic keratosis: Secondary | ICD-10-CM | POA: Diagnosis not present

## 2020-09-06 DIAGNOSIS — C44619 Basal cell carcinoma of skin of left upper limb, including shoulder: Secondary | ICD-10-CM

## 2020-09-06 DIAGNOSIS — D489 Neoplasm of uncertain behavior, unspecified: Secondary | ICD-10-CM

## 2020-09-06 DIAGNOSIS — L821 Other seborrheic keratosis: Secondary | ICD-10-CM | POA: Diagnosis not present

## 2020-09-06 DIAGNOSIS — D229 Melanocytic nevi, unspecified: Secondary | ICD-10-CM | POA: Diagnosis not present

## 2020-09-06 DIAGNOSIS — C4491 Basal cell carcinoma of skin, unspecified: Secondary | ICD-10-CM

## 2020-09-06 DIAGNOSIS — C44519 Basal cell carcinoma of skin of other part of trunk: Secondary | ICD-10-CM

## 2020-09-06 DIAGNOSIS — L578 Other skin changes due to chronic exposure to nonionizing radiation: Secondary | ICD-10-CM

## 2020-09-06 DIAGNOSIS — T148XXA Other injury of unspecified body region, initial encounter: Secondary | ICD-10-CM

## 2020-09-06 DIAGNOSIS — L739 Follicular disorder, unspecified: Secondary | ICD-10-CM

## 2020-09-06 HISTORY — DX: Basal cell carcinoma of skin, unspecified: C44.91

## 2020-09-06 NOTE — Patient Instructions (Addendum)
Actinic keratoses are precancerous spots that appear secondary to cumulative UV radiation exposure/sun exposure over time. They are chronic with expected duration over 1 year. A portion of actinic keratoses will progress to squamous cell carcinoma of the skin. It is not possible to reliably predict which spots will progress to skin cancer and so treatment is recommended to prevent development of skin cancer.  Recommend daily broad spectrum sunscreen SPF 30+ to sun-exposed areas, reapply every 2 hours as needed.  Recommend staying in the shade or wearing long sleeves, sun glasses (UVA+UVB protection) and wide brim hats (4-inch brim around the entire circumference of the hat). Call for new or changing lesions.  Cryotherapy Aftercare  . Wash gently with soap and water everyday.   Marland Kitchen Apply Vaseline and Band-Aid daily until healed.  Recommend for Tip of nose - apply blister band aids until wound healed   Seborrheic Keratosis  What causes seborrheic keratoses? Seborrheic keratoses are harmless, common skin growths that first appear during adult life.  As time goes by, more growths appear.  Some people may develop a large number of them.  Seborrheic keratoses appear on both covered and uncovered body parts.  They are not caused by sunlight.  The tendency to develop seborrheic keratoses can be inherited.  They vary in color from skin-colored to gray, brown, or even black.  They can be either smooth or have a rough, warty surface.   Seborrheic keratoses are superficial and look as if they were stuck on the skin.  Under the microscope this type of keratosis looks like layers upon layers of skin.  That is why at times the top layer may seem to fall off, but the rest of the growth remains and re-grows.    Treatment Seborrheic keratoses do not need to be treated, but can easily be removed in the office.  Seborrheic keratoses often cause symptoms when they rub on clothing or jewelry.  Lesions can be in the way  of shaving.  If they become inflamed, they can cause itching, soreness, or burning.  Removal of a seborrheic keratosis can be accomplished by freezing, burning, or surgery. If any spot bleeds, scabs, or grows rapidly, please return to have it checked, as these can be an indication of a skin cancer.  Wound Care Instructions  1. Cleanse wound gently with soap and water once a day then pat dry with clean gauze. Apply a thing coat of Petrolatum (petroleum jelly, "Vaseline") over the wound (unless you have an allergy to this). We recommend that you use a new, sterile tube of Vaseline. Do not pick or remove scabs. Do not remove the yellow or white "healing tissue" from the base of the wound.  2. Cover the wound with fresh, clean, nonstick gauze and secure with paper tape. You may use Band-Aids in place of gauze and tape if the would is small enough, but would recommend trimming much of the tape off as there is often too much. Sometimes Band-Aids can irritate the skin.  3. You should call the office for your biopsy report after 1 week if you have not already been contacted.  4. If you experience any problems, such as abnormal amounts of bleeding, swelling, significant bruising, significant pain, or evidence of infection, please call the office immediately.  5. FOR ADULT SURGERY PATIENTS: If you need something for pain relief you may take 1 extra strength Tylenol (acetaminophen) AND 2 Ibuprofen (200mg  each) together every 4 hours as needed for pain. (do not take  these if you are allergic to them or if you have a reason you should not take them.) Typically, you may only need pain medication for 1 to 3 days.   Melanoma ABCDEs  Melanoma is the most dangerous type of skin cancer, and is the leading cause of death from skin disease.  You are more likely to develop melanoma if you:  Have light-colored skin, light-colored eyes, or red or blond hair  Spend a lot of time in the sun  Tan regularly, either  outdoors or in a tanning bed  Have had blistering sunburns, especially during childhood  Have a close family member who has had a melanoma  Have atypical moles or large birthmarks  Early detection of melanoma is key since treatment is typically straightforward and cure rates are extremely high if we catch it early.   The first sign of melanoma is often a change in a mole or a new dark spot.  The ABCDE system is a way of remembering the signs of melanoma.  A for asymmetry:  The two halves do not match. B for border:  The edges of the growth are irregular. C for color:  A mixture of colors are present instead of an even brown color. D for diameter:  Melanomas are usually (but not always) greater than 19mm - the size of a pencil eraser. E for evolution:  The spot keeps changing in size, shape, and color.  Please check your skin once per month between visits. You can use a small mirror in front and a large mirror behind you to keep an eye on the back side or your body.   If you see any new or changing lesions before your next follow-up, please call to schedule a visit.  Please continue daily skin protection including broad spectrum sunscreen SPF 30+ to sun-exposed areas, reapplying every 2 hours as needed when you're outdoors.   Staying in the shade or wearing long sleeves, sun glasses (UVA+UVB protection) and wide brim hats (4-inch brim around the entire circumference of the hat) are also recommended for sun protection.   Recommend taking Heliocare sun protection supplement daily in sunny weather for additional sun protection. For maximum protection on the sunniest days, you can take up to 2 capsules of regular Heliocare OR take 1 capsule of Heliocare Ultra. For prolonged exposure (such as a full day in the sun), you can repeat your dose of the supplement 4 hours after your first dose. Heliocare can be purchased at [facility] or at [email or web address].

## 2020-09-06 NOTE — Progress Notes (Signed)
New Patient Visit  Subjective  [name] is a 70 y.o. male who presents for the following: New Patient (Initial Visit) (New patient here today with spot on right upper arm and left upper arm noticed for years . Tip of left shoulder back itchy he noticed a couple years ago . Scaly spots on right side scalp for over a year now sometimes itchy. Patient denies any family or personal history of skin cancer. ).  Objective  Well appearing patient in no apparent distress; mood and affect are within normal limits.  A focused examination was performed including bilateral arms, back, face, scalp . Relevant physical exam findings are noted in the Assessment and Plan.  Objective  Right pariatal scalp x 2,  right occipital scalp x 1 (3): Erythematous thin papules/macules with gritty scale.   Objective  Scalp: Perifollicular erythematous papules  Objective  Right upper arm x 1, right forearm x 1: Erythematous keratotic or waxy stuck-on papule or plaque.   Objective  Left upper back lateral: 1.4 cm pearly pink plaque      Objective  Left upper back medial: 1.9 cm pearly pink plaque     Objective  Left Upper Arm: 0.9 cm scaly pink papule      Objective  tip of nose: Healing wound caused by cpap machine  Assessment & Plan  Actinic keratosis (3) Right pariatal scalp x 2,  right occipital scalp x 1  Broad AK at right parietal scalp, hypertrophic AK at right parietal scalp, right occipital scalp hypertrophic AK  Consider biopsy to area on right parietal scalp at next follow up if not responding to Ln2 treatment   Prior to procedure, discussed risks of blister formation, small wound, skin dyspigmentation, or rare scar following cryotherapy.  Actinic keratoses are precancerous spots that appear secondary to cumulative UV radiation exposure/sun exposure over time. They are chronic with expected duration over 1 year. A portion of actinic keratoses will progress to  squamous cell carcinoma of the skin. It is not possible to reliably predict which spots will progress to skin cancer and so treatment is recommended to prevent development of skin cancer.  Recommend daily broad spectrum sunscreen SPF 30+ to sun-exposed areas, reapply every 2 hours as needed.  Recommend staying in the shade or wearing long sleeves, sun glasses (UVA+UVB protection) and wide brim hats (4-inch brim around the entire circumference of the hat). Call for new or changing lesions.   Destruction of lesion - Right pariatal scalp x 2,  right occipital scalp x 1  Destruction method: cryotherapy   Informed consent: discussed and consent obtained   Lesion destroyed using liquid nitrogen: Yes   Cryotherapy cycles:  2 Outcome: patient tolerated procedure well with no complications   Post-procedure details: wound care instructions given    Folliculitis Scalp  If becomes bothersome can prescribe treatment   Inflamed seborrheic keratosis Right upper arm x 1, right forearm x 1  Benign-appearing.  Observation.  Call clinic for new or changing lesions.  Recommend daily use of broad spectrum spf 30+ sunscreen to sun-exposed areas.   Patient deferred treatment at this time.   Neoplasm of uncertain behavior (3) Left upper back lateral  Skin / nail biopsy Type of biopsy: tangential   Informed consent: discussed and consent obtained   Timeout: patient name, date of birth, surgical site, and procedure verified   Patient was prepped and draped in usual sterile fashion: Area prepped with isopropyl alcohol. Anesthesia: the lesion was anesthetized in  a standard fashion   Anesthetic:  1% lidocaine w/ epinephrine 1-100,000 buffered w/ 8.4% NaHCO3 Instrument used: flexible razor blade   Hemostasis achieved with: aluminum chloride   Outcome: patient tolerated procedure well   Post-procedure details: wound care instructions given   Additional details:  Mupirocin and a bandage  applied  Specimen 1 - Surgical pathology Differential Diagnosis: r/o bcc  Check Margins: No Left upper back lateral 1.4 cm pearly pink plaque  Left upper back medial  Skin / nail biopsy Type of biopsy: tangential   Informed consent: discussed and consent obtained   Timeout: patient name, date of birth, surgical site, and procedure verified   Patient was prepped and draped in usual sterile fashion: Area prepped with isopropyl alcohol. Anesthesia: the lesion was anesthetized in a standard fashion   Anesthetic:  1% lidocaine w/ epinephrine 1-100,000 buffered w/ 8.4% NaHCO3 Instrument used: flexible razor blade   Hemostasis achieved with: aluminum chloride   Outcome: patient tolerated procedure well   Post-procedure details: wound care instructions given   Additional details:  Mupirocin and a bandage applied  Specimen 2 - Surgical pathology Differential Diagnosis: r/o bcc  Check Margins: No Left upper back medial 1.9 cm pearly pink plaque  Left Upper Arm  Skin / nail biopsy Type of biopsy: tangential   Informed consent: discussed and consent obtained   Timeout: patient name, date of birth, surgical site, and procedure verified   Patient was prepped and draped in usual sterile fashion: Area prepped with isopropyl alcohol. Anesthesia: the lesion was anesthetized in a standard fashion   Anesthetic:  1% lidocaine w/ epinephrine 1-100,000 buffered w/ 8.4% NaHCO3 Instrument used: flexible razor blade   Hemostasis achieved with: aluminum chloride   Outcome: patient tolerated procedure well   Post-procedure details: wound care instructions given   Additional details:  Mupirocin and a bandage applied  Specimen 3 - Surgical pathology Differential Diagnosis: r/o bcc   Check Margins: No 0.9 cm scaly pink papule  R/o bcc   Excoriation tip of nose  Recommend blister bandaids until wound is healed  Actinic Damage - chronic, secondary to cumulative UV radiation exposure/sun  exposure over time - diffuse scaly erythematous macules with underlying dyspigmentation - Recommend daily broad spectrum sunscreen SPF 30+ to sun-exposed areas, reapply every 2 hours as needed.  - Recommend staying in the shade or wearing long sleeves, sun glasses (UVA+UVB protection) and wide brim hats (4-inch brim around the entire circumference of the hat). - Call for new or changing lesions.  Lentigines - Scattered tan macules - Due to sun exposure - Benign-appering, observe - Recommend daily broad spectrum sunscreen SPF 30+ to sun-exposed areas, reapply every 2 hours as needed. - Call for any changes  Seborrheic Keratoses - Stuck-on, waxy, tan-brown papules and/or plaques  - Benign-appearing - Discussed benign etiology and prognosis. - Observe - Call for any changes  Melanocytic Nevi - Tan-brown and/or pink-flesh-colored symmetric macules and papules - Benign appearing on exam today - Observation - Call clinic for new or changing moles - Recommend daily use of broad spectrum spf 30+ sunscreen to sun-exposed areas.   Return in about 2 months (around 11/06/2020) for ak followup and tbse .  I, Ruthell Rummage, CMA, am acting as scribe for Forest Gleason, MD.  Documentation: I have reviewed the above documentation for accuracy and completeness, and I agree with the above.  Forest Gleason, MD

## 2020-09-07 ENCOUNTER — Encounter: Payer: Self-pay | Admitting: Dermatology

## 2020-09-11 ENCOUNTER — Telehealth: Payer: Self-pay

## 2020-09-11 NOTE — Telephone Encounter (Signed)
-----   Message from Alfonso Patten, MD sent at 09/11/2020  1:58 PM EDT ----- 1. Skin , left upper back lateral SUPERFICIAL BASAL CELL CARCINOMA --> ED&C  2. Skin , left upper back medial BASAL CELL CARCINOMA, NODULAR PATTERN --> ED&C  3. Skin , left upper arm SUPERFICIAL BASAL CELL CARCINOMA --> ED&C  Dr. Jerilynn Mages called, no answer, left voicemail 09/10/2020 at 2 PM  MAs please call starting 09/11/2020. Please let me know if patient has any questions. Thank you!

## 2020-09-11 NOTE — Telephone Encounter (Signed)
Patient was called and informed of biopsy results. Patient scheduled for treatment on areas. Patient denied further questions.

## 2020-09-13 ENCOUNTER — Telehealth: Payer: Self-pay

## 2020-09-20 ENCOUNTER — Ambulatory Visit (INDEPENDENT_AMBULATORY_CARE_PROVIDER_SITE_OTHER): Payer: Medicare Other | Admitting: Dermatology

## 2020-09-20 ENCOUNTER — Other Ambulatory Visit: Payer: Self-pay

## 2020-09-20 DIAGNOSIS — C44619 Basal cell carcinoma of skin of left upper limb, including shoulder: Secondary | ICD-10-CM

## 2020-09-20 DIAGNOSIS — C44519 Basal cell carcinoma of skin of other part of trunk: Secondary | ICD-10-CM | POA: Diagnosis not present

## 2020-09-20 DIAGNOSIS — L570 Actinic keratosis: Secondary | ICD-10-CM

## 2020-09-20 DIAGNOSIS — C4492 Squamous cell carcinoma of skin, unspecified: Secondary | ICD-10-CM

## 2020-09-20 DIAGNOSIS — C4491 Basal cell carcinoma of skin, unspecified: Secondary | ICD-10-CM

## 2020-09-20 NOTE — Patient Instructions (Addendum)
Electrodesiccation and Curettage ("Scrape and Burn") Wound Care Instructions  1. Leave the original bandage on for 24 hours if possible.  If the bandage becomes soaked or soiled before that time, it is OK to remove it and examine the wound.  A small amount of post-operative bleeding is normal.  If excessive bleeding occurs, remove the bandage, place gauze over the site and apply continuous pressure (no peeking) over the area for 30 minutes. If this does not work, please call our clinic as soon as possible or page your doctor if it is after hours.   2. Once a day, cleanse the wound with soap and water. It is fine to shower. If a thick crust develops you may use a Q-tip dipped into dilute hydrogen peroxide (mix 1:1 with water) to dissolve it.  Hydrogen peroxide can slow the healing process, so use it only as needed.    3. After washing, apply petroleum jelly (Vaseline) or an antibiotic ointment if your doctor prescribed one for you, followed by a bandage.    4. For best healing, the wound should be covered with a layer of ointment at all times. If you are not able to keep the area covered with a bandage to hold the ointment in place, this may mean re-applying the ointment several times a day.  Continue this wound care until the wound has healed and is no longer open. It may take several weeks for the wound to heal and close.  Itching and mild discomfort is normal during the healing process.  If you have any discomfort, you can take Tylenol (acetaminophen) or ibuprofen as directed on the bottle. (Please do not take these if you have an allergy to them or cannot take them for another reason).  Some redness, tenderness and white or yellow material in the wound is normal healing.  If the area becomes very sore and red, or develops a thick yellow-green material (pus), it may be infected; please notify us.    Wound healing continues for up to one year following surgery. It is not unusual to experience pain  in the scar from time to time during the interval.  If the pain becomes severe or the scar thickens, you should notify the office.    A slight amount of redness in a scar is expected for the first six months.  After six months, the redness will fade and the scar will soften and fade.  The color difference becomes less noticeable with time.  If there are any problems, return for a post-op surgery check at your earliest convenience.  To improve the appearance of the scar, you can use silicone scar gel, cream, or sheets (such as Mederma or Serica) every night for up to one year. These are available over the counter (without a prescription).  Please call our office at [phone] for any questions or concerns.  If you have any questions or concerns for your doctor, please call our main line at [phone] and press option 4 to reach your doctor's medical assistant. If no one answers, please leave a voicemail as directed and we will return your call as soon as possible. Messages left after 4 pm will be answered the following business day.   You may also send [place] a message via [place]. We typically respond to MyChart messages within 1-2 business days.  For prescription refills, please ask your pharmacy to contact our office. Our fax number is [phone].  If you have an urgent issue when the  clinic is closed that cannot wait until the next business day, you can page your doctor at the number below.    Please note that while we do our best to be available for urgent issues outside of office hours, we are not available 24/7.   If you have an urgent issue and are unable to reach [place], you may choose to seek medical care at your doctor's office, retail clinic, urgent care center, or emergency room.  If you have a medical emergency, please immediately call 911 or go to the emergency department.  Pager Numbers  - Dr. Nehemiah Massed: [phone]  - Dr. Laurence Ferrari: [phone]  - Dr. Nicole Kindred:  [phone]  In the event of inclement weather, please call our main line at [phone] for an update on the status of any delays or closures.  Dermatology Medication Tips: Please keep the boxes that topical medications come in in order to help keep track of the instructions about where and how to use these. Pharmacies typically print the medication instructions only on the boxes and not directly on the medication tubes.   If your medication is too expensive, please contact our office at [phone] option 4 or send [place] a message through [place].   We are unable to tell what your co-pay for medications will be in advance as this is different depending on your insurance coverage. However, we may be able to find a substitute medication at lower cost or fill out paperwork to get insurance to cover a needed medication.   If a prior authorization is required to get your medication covered by your insurance company, please allow [place] 1-2 business days to complete this process.  Drug prices often vary depending on where the prescription is filled and some pharmacies may offer cheaper prices.  The website www.goodrx.com contains coupons for medications through different pharmacies. The prices here do not account for what the cost may be with help from insurance (it may be cheaper with your insurance), but the website can give you the price if you did not use any insurance.  - You can print the associated coupon and take it with your prescription to the pharmacy.  - You may also stop by our office during regular business hours and pick up a GoodRx coupon card.  - If you need your prescription sent electronically to a different pharmacy, notify our office through [facility] or by phone at [phone] option 4.  Cryotherapy Aftercare  . Wash gently with soap and water everyday.   Marland Kitchen Apply Vaseline and Band-Aid daily until healed.  If you have any questions or concerns for your doctor, please  call our main line at [phone] and press option 4 to reach your doctor's medical assistant. If no one answers, please leave a voicemail as directed and we will return your call as soon as possible. Messages left after 4 pm will be answered the following business day.   You may also send [place] a message via [place]. We typically respond to MyChart messages within 1-2 business days.  For prescription refills, please ask your pharmacy to contact our office. Our fax number is [phone].  If you have an urgent issue when the clinic is closed that cannot wait until the next business day, you can page your doctor at the number below.    Please note that while we do our best to be available for urgent issues outside of office hours, we are not available 24/7.   If you have an  urgent issue and are unable to reach [place], you may choose to seek medical care at your doctor's office, retail clinic, urgent care center, or emergency room.  If you have a medical emergency, please immediately call 911 or go to the emergency department.  Pager Numbers  - Dr. Nehemiah Massed: [phone]  - Dr. Laurence Ferrari: [phone]  - Dr. Nicole Kindred: [phone]  In the event of inclement weather, please call our main line at [phone] for an update on the status of any delays or closures.  Dermatology Medication Tips: Please keep the boxes that topical medications come in in order to help keep track of the instructions about where and how to use these. Pharmacies typically print the medication instructions only on the boxes and not directly on the medication tubes.   If your medication is too expensive, please contact our office at [phone] option 4 or send [place] a message through [place].   We are unable to tell what your co-pay for medications will be in advance as this is different depending on your insurance coverage. However, we may be able to find a substitute medication at lower cost or fill out paperwork to get  insurance to cover a needed medication.   If a prior authorization is required to get your medication covered by your insurance company, please allow [place] 1-2 business days to complete this process.  Drug prices often vary depending on where the prescription is filled and some pharmacies may offer cheaper prices.  The website www.goodrx.com contains coupons for medications through different pharmacies. The prices here do not account for what the cost may be with help from insurance (it may be cheaper with your insurance), but the website can give you the price if you did not use any insurance.  - You can print the associated coupon and take it with your prescription to the pharmacy.  - You may also stop by our office during regular business hours and pick up a GoodRx coupon card.  - If you need your prescription sent electronically to a different pharmacy, notify our office through [facility] or by phone at [phone] option 4.

## 2020-09-20 NOTE — Progress Notes (Addendum)
Follow-Up Visit   Subjective  [name] is a 70 y.o. male who presents for the following: follow up (Patient here today to have several areas removed by [facility] that were proven basal cell carcinoma. He also reports a spot on scalp that was treated with Ln2 last visit and still bothering him. ).  The following portions of the chart were reviewed this encounter and updated as appropriate:  Tobacco  Allergies  Meds  Problems  Med Hx  Surg Hx  Fam Hx      Objective  Well appearing patient in no apparent distress; mood and affect are within normal limits.  A focused examination was performed including face, back, arm, scalp. Relevant physical exam findings are noted in the Assessment and Plan.  Objective  Right Parietal Scalp x 1: Erythematous thin papules/macules with gritty scale.   Objective  Left Upper Back lateral: Pink plaque  Objective  Left Upper Arm - Anterior: Pink plaque  Objective  Left Upper Back Medial: Pink plaque  Assessment & Plan  Basal cell carcinoma of skin (3) Left Upper Back lateral  Destruction of lesion  Destruction method: electrodesiccation and curettage   Informed consent: discussed and consent obtained   Timeout:  patient name, date of birth, surgical site, and procedure verified Patient was prepped and draped in usual sterile fashion: area prepped with isopropyl alcohol. Anesthesia: the lesion was anesthetized in a standard fashion   Anesthetic:  1% lidocaine w/ epinephrine 1-100,000 buffered w/ 8.4% NaHCO3 Curettage performed in three different directions: Yes   Electrodesiccation performed over the curetted area: Yes   Curettage cycles:  3 Final wound size (cm):  1.1 Hemostasis achieved with:  electrodesiccation Outcome: patient tolerated procedure well with no complications   Post-procedure details: wound care instructions given   Additional details:  Mupirocin and a pressure dressing applied  Left Upper Arm -  Anterior  Destruction of lesion  Destruction method: electrodesiccation and curettage   Informed consent: discussed and consent obtained   Timeout:  patient name, date of birth, surgical site, and procedure verified Patient was prepped and draped in usual sterile fashion: area prepped with isopropyl alcohol. Anesthesia: the lesion was anesthetized in a standard fashion   Anesthetic:  1% lidocaine w/ epinephrine 1-100,000 buffered w/ 8.4% NaHCO3 Curettage performed in three different directions: Yes   Electrodesiccation performed over the curetted area: Yes   Curettage cycles:  3 Final wound size (cm):  1.3 Hemostasis achieved with:  electrodesiccation Outcome: patient tolerated procedure well with no complications   Post-procedure details: wound care instructions given   Additional details:  Mupirocin and a pressure dressing applied  Left Upper Back Medial  Destruction of lesion  Destruction method: electrodesiccation and curettage   Informed consent: discussed and consent obtained   Timeout:  patient name, date of birth, surgical site, and procedure verified Patient was prepped and draped in usual sterile fashion: area prepped with isopropyl alcohol. Anesthesia: the lesion was anesthetized in a standard fashion   Anesthetic:  1% lidocaine w/ epinephrine 1-100,000 buffered w/ 8.4% NaHCO3 Curettage performed in three different directions: Yes   Electrodesiccation performed over the curetted area: Yes   Curettage cycles:  3 Final wound size (cm):  1.7 Hemostasis achieved with:  electrodesiccation Outcome: patient tolerated procedure well with no complications   Post-procedure details: wound care instructions given   Additional details:  Mupirocin and a pressure dressing applied  Biopsy proven SUPERFICIAL BASAL CELL CARCINOMAs at arm and lateral back, Nodular BCC  at medial back   Actinic keratosis Right Parietal Scalp x 1  Prior to procedure, discussed risks of blister  formation, small wound, skin dyspigmentation, or rare scar following cryotherapy.    Destruction of lesion - Right Parietal Scalp x 1  Destruction method: cryotherapy   Informed consent: discussed and consent obtained   Lesion destroyed using liquid nitrogen: Yes   Cryotherapy cycles:  2 Outcome: patient tolerated procedure well with no complications   Post-procedure details: wound care instructions given    Return if symptoms worsen or fail to improve, for Keep follow up as scheduled 11/14/20.  I, Ruthell Rummage, CMA, am acting as scribe for Forest Gleason, MD.  Documentation: I have reviewed the above documentation for accuracy and completeness, and I agree with the above.  Forest Gleason, MD

## 2020-09-30 ENCOUNTER — Encounter: Payer: Self-pay | Admitting: Dermatology

## 2020-09-30 MED ORDER — MUPIROCIN 2 % EX OINT: 1.0000 "application " | TOPICAL_OINTMENT | Freq: Every day | CUTANEOUS | 0 refills | Status: DC

## 2020-10-02 NOTE — Telephone Encounter (Signed)
Error. ng 

## 2020-10-23 ENCOUNTER — Other Ambulatory Visit: Payer: Self-pay | Admitting: Cardiovascular Disease

## 2020-10-23 DIAGNOSIS — I25118 Atherosclerotic heart disease of native coronary artery with other forms of angina pectoris: Secondary | ICD-10-CM
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
## 2020-11-06 ENCOUNTER — Encounter: Payer: Self-pay | Admitting: Cardiovascular Disease

## 2020-11-06 ENCOUNTER — Ambulatory Visit (INDEPENDENT_AMBULATORY_CARE_PROVIDER_SITE_OTHER): Payer: Medicare Other | Admitting: Cardiovascular Disease

## 2020-11-06 ENCOUNTER — Other Ambulatory Visit: Payer: Self-pay

## 2020-11-06 VITALS — BP 140/76 | HR 86 | Ht 68.0 in | Wt 194.4 lb

## 2020-11-06 DIAGNOSIS — I10 Essential (primary) hypertension: Secondary | ICD-10-CM | POA: Diagnosis not present

## 2020-11-06 DIAGNOSIS — E7800 Pure hypercholesterolemia, unspecified: Secondary | ICD-10-CM

## 2020-11-06 DIAGNOSIS — I25118 Atherosclerotic heart disease of native coronary artery with other forms of angina pectoris: Secondary | ICD-10-CM | POA: Diagnosis not present

## 2020-11-06 DIAGNOSIS — Z01818 Encounter for other preprocedural examination: Secondary | ICD-10-CM

## 2020-11-06 DIAGNOSIS — I255 Ischemic cardiomyopathy: Secondary | ICD-10-CM

## 2020-11-06 DIAGNOSIS — Z01810 Encounter for preprocedural cardiovascular examination: Secondary | ICD-10-CM

## 2020-11-06 DIAGNOSIS — E785 Hyperlipidemia, unspecified: Secondary | ICD-10-CM | POA: Diagnosis not present

## 2020-11-06 MED ORDER — ISOSORBIDE MONONITRATE ER 30 MG PO TB24: 30.0000 mg | ORAL_TABLET | Freq: Every day | ORAL | 3 refills | Status: DC

## 2020-11-06 NOTE — Patient Instructions (Addendum)
Medication Instructions:  START Imdur 30 mg daily  Ok to take NTG for angina  If you need a refill on your cardiac medications before your next appointment, please call your pharmacy.    Lab work: CBC & BMP   Testing/Procedures: [facility] [address] [place], [place] [address] [phone] Please arrive at the [place] are scheduled for a Cardiac Cath on: 11/15/2020 Please arrive at 05:30 am on the day of your procedure Your start time is 07:30 am  Please expect a call from our [facility] to pre-register you Do not eat/drink anything after midnight Someone will need to drive you home It is recommended someone be with you for the first 24 hours after your procedure Wear clothes that are easy to get on/off and wear slip on shoes if possible  Please bring a current list of all medications with you  _X__ You may take all of your medications the morning of your procedure with enough water to swallow safely Be sure to take aspirin 81 mg the morning of the hearth cath  _X__ DO NOT take these medications before your procedure: Hold metformin 24 hrs prior to procedure and 48 hours after the procedure. May take metformin after the 48 hours.   For your safety and being able to monitor your condition, we request that they you do not wear nail polish, gel polish, artificial nails, or any other type of covering on natural nails including finger and toenails. If you have artificial nails, gel coating, etc.that requires to be removed by a nail saloon please have this removed prior to procedure. Your procedure may need to be canceled/ delayed if the performing provider/anesthesia team feels like the patient is unable to be adequately monitored.  Day of your procedure:  Please arrive at the [facility] main. Free valet service is available.  Please check-in at the registration desk to receive your  armband. After receiving your armband someone will escort you to the cardiac cath/special procedures waiting area.  The usual length of stay after your procedure is about 2 to 3 hours.  This can vary.  If you have any questions, please call our office at [phone], or you may call the cardiac cath lab at [facility] directly at [phone]  Your physician has requested that you have a cardiac catheterization. Cardiac catheterization is used to diagnose and/or treat various heart conditions. Doctors may recommend this procedure for a number of different reasons. The most common reason is to evaluate chest pain. Chest pain can be a symptom of coronary artery disease (CAD), and cardiac catheterization can show whether plaque is narrowing or blocking your heart's arteries. This procedure is also used to evaluate the valves, as well as measure the blood flow and oxygen levels in different parts of your heart. For further information please visit HugeFiesta.tn. Please follow instruction sheet, as given.  Follow-Up: At [facility], you and your health needs are our priority.  As part of our continuing mission to provide you with exceptional heart care, we have created designated Provider Care Teams.  These Care Teams include your primary Cardiologist (physician) and Advanced Practice Providers (APPs -  Physician Assistants and Nurse Practitioners) who all work together to provide you with the care you need, when you need it.  You will need a follow up appointment in 1 month  Providers on your designated Care Team:   Murray Hodgkins,  NP Christell Faith, PA-C Marrianne Mood, PA-C [place], [place]  COVID-19 Vaccine Information can be found at: [email or web address].uk For questions related to vaccine distribution or appointments, please email vaccine@ .com or call [phone].

## 2020-11-07 LAB — BASIC METABOLIC PANEL
BUN/Creatinine Ratio: 21 (ref 10–24)
BUN: 22 mg/dL (ref 8–27)
CO2: 23 mmol/L (ref 20–29)
Calcium: 10 mg/dL (ref 8.6–10.2)
Chloride: 101 mmol/L (ref 96–106)
Creatinine, Ser: 1.03 mg/dL (ref 0.76–1.27)
Glucose: 175 mg/dL — ABNORMAL HIGH (ref 65–99)
Potassium: 4.5 mmol/L (ref 3.5–5.2)
Sodium: 139 mmol/L (ref 134–144)
eGFR: 79 mL/min/{1.73_m2} (ref >59)

## 2020-11-07 LAB — CBC
Hematocrit: 45.6 % (ref 37.5–51.0)
Hemoglobin: 15.6 g/dL (ref 13.0–17.7)
MCH: 32.4 pg (ref 26.6–33.0)
MCHC: 34.2 g/dL (ref 31.5–35.7)
MCV: 95 fL (ref 79–97)
Platelets: 173 10*3/uL (ref 150–450)
RBC: 4.81 x10E6/uL (ref 4.14–5.80)
RDW: 11.5 % — ABNORMAL LOW (ref 11.6–15.4)
WBC: 6.6 10*3/uL (ref 3.4–10.8)

## 2020-11-12 ENCOUNTER — Other Ambulatory Visit: Payer: Self-pay | Admitting: Cardiovascular Disease

## 2020-11-12 DIAGNOSIS — I200 Unstable angina: Secondary | ICD-10-CM

## 2020-11-13 ENCOUNTER — Telehealth: Payer: Self-pay | Admitting: *Deleted

## 2020-11-13 NOTE — Telephone Encounter (Addendum)
Pt contacted pre-catheterization scheduled at [facility] for: Thursday November 15, 2020 7:30 AM Verified arrival time and place: [place] [facility]) at: 5:30 AM   No solid food after midnight prior to cath, clear liquids until 5 AM day of procedure.  Hold: Januvia-AM of procedure Jardiance-AM of procedure Metformin-day of procedure and 48 hours post procedure.  Except hold medications AM meds can be  taken pre-cath with sips of water including: aspirin 81 mg Effient 10 mg  Confirmed patient has responsible adult to drive home post procedure and be with patient first 24 hours after arriving home: yes  You are allowed ONE visitor in the waiting room during the time you are at the hospital for your procedure. Both you and your visitor must wear a mask once you enter the hospital.   Patient reports does not currently have any symptoms concerning for COVID-19 and no household members with COVID-19 like illness.   Reviewed procedure/mask/visitor instructions with patient.

## 2020-11-14 ENCOUNTER — Ambulatory Visit: Payer: Medicare Other | Admitting: Dermatology

## 2020-11-15 ENCOUNTER — Encounter (HOSPITAL_COMMUNITY)
Admission: RE | Disposition: A | Discharge status: Home Health | Payer: Self-pay | Source: Home / Self Care | Attending: Internal Medicine

## 2020-11-15 ENCOUNTER — Observation Stay (HOSPITAL_BASED_OUTPATIENT_CLINIC_OR_DEPARTMENT_OTHER): Payer: Medicare Other

## 2020-11-15 ENCOUNTER — Observation Stay (HOSPITAL_COMMUNITY): Payer: Medicare Other

## 2020-11-15 ENCOUNTER — Other Ambulatory Visit: Payer: Self-pay

## 2020-11-15 ENCOUNTER — Inpatient Hospital Stay (HOSPITAL_COMMUNITY)
Admission: RE | Admit: 2020-11-15 | Discharge: 2020-11-24 | DRG: 233 | Disposition: A | Discharge status: Home Health | Payer: Medicare Other | Attending: Cardiothoracic Surgery | Admitting: Cardiothoracic Surgery

## 2020-11-15 DIAGNOSIS — Y831 Surgical operation with implant of artificial internal device as the cause of abnormal reaction of the patient, or of later complication, without mention of misadventure at the time of the procedure: Secondary | ICD-10-CM | POA: Diagnosis present

## 2020-11-15 DIAGNOSIS — G4733 Obstructive sleep apnea (adult) (pediatric): Secondary | ICD-10-CM | POA: Diagnosis present

## 2020-11-15 DIAGNOSIS — E785 Hyperlipidemia, unspecified: Secondary | ICD-10-CM | POA: Diagnosis present

## 2020-11-15 DIAGNOSIS — I110 Hypertensive heart disease with heart failure: Secondary | ICD-10-CM | POA: Diagnosis present

## 2020-11-15 DIAGNOSIS — J90 Pleural effusion, not elsewhere classified: Secondary | ICD-10-CM

## 2020-11-15 DIAGNOSIS — M1990 Unspecified osteoarthritis, unspecified site: Secondary | ICD-10-CM | POA: Diagnosis present

## 2020-11-15 DIAGNOSIS — Z91040 Latex allergy status: Secondary | ICD-10-CM

## 2020-11-15 DIAGNOSIS — I248 Other forms of acute ischemic heart disease: Secondary | ICD-10-CM | POA: Diagnosis not present

## 2020-11-15 DIAGNOSIS — I493 Ventricular premature depolarization: Secondary | ICD-10-CM | POA: Diagnosis present

## 2020-11-15 DIAGNOSIS — I159 Secondary hypertension, unspecified: Secondary | ICD-10-CM | POA: Diagnosis not present

## 2020-11-15 DIAGNOSIS — I255 Ischemic cardiomyopathy: Secondary | ICD-10-CM | POA: Diagnosis present

## 2020-11-15 DIAGNOSIS — E669 Obesity, unspecified: Secondary | ICD-10-CM | POA: Diagnosis not present

## 2020-11-15 DIAGNOSIS — D696 Thrombocytopenia, unspecified: Secondary | ICD-10-CM | POA: Diagnosis not present

## 2020-11-15 DIAGNOSIS — Z825 Family history of asthma and other chronic lower respiratory diseases: Secondary | ICD-10-CM

## 2020-11-15 DIAGNOSIS — D62 Acute posthemorrhagic anemia: Secondary | ICD-10-CM | POA: Diagnosis not present

## 2020-11-15 DIAGNOSIS — I25110 Atherosclerotic heart disease of native coronary artery with unstable angina pectoris: Principal | ICD-10-CM | POA: Diagnosis present

## 2020-11-15 DIAGNOSIS — Z20822 Contact with and (suspected) exposure to covid-19: Secondary | ICD-10-CM | POA: Diagnosis not present

## 2020-11-15 DIAGNOSIS — Z85828 Personal history of other malignant neoplasm of skin: Secondary | ICD-10-CM

## 2020-11-15 DIAGNOSIS — Z7982 Long term (current) use of aspirin: Secondary | ICD-10-CM

## 2020-11-15 DIAGNOSIS — Z87891 Personal history of nicotine dependence: Secondary | ICD-10-CM | POA: Diagnosis not present

## 2020-11-15 DIAGNOSIS — I5043 Acute on chronic combined systolic (congestive) and diastolic (congestive) heart failure: Secondary | ICD-10-CM | POA: Diagnosis present

## 2020-11-15 DIAGNOSIS — Z9889 Other specified postprocedural states: Secondary | ICD-10-CM

## 2020-11-15 DIAGNOSIS — T82855A Stenosis of coronary artery stent, initial encounter: Secondary | ICD-10-CM | POA: Diagnosis not present

## 2020-11-15 DIAGNOSIS — Z79899 Other long term (current) drug therapy: Secondary | ICD-10-CM

## 2020-11-15 DIAGNOSIS — E1165 Type 2 diabetes mellitus with hyperglycemia: Secondary | ICD-10-CM | POA: Diagnosis present

## 2020-11-15 DIAGNOSIS — J9811 Atelectasis: Secondary | ICD-10-CM | POA: Diagnosis not present

## 2020-11-15 DIAGNOSIS — Z8249 Family history of ischemic heart disease and other diseases of the circulatory system: Secondary | ICD-10-CM

## 2020-11-15 DIAGNOSIS — I200 Unstable angina: Secondary | ICD-10-CM | POA: Diagnosis present

## 2020-11-15 DIAGNOSIS — I2510 Atherosclerotic heart disease of native coronary artery without angina pectoris: Secondary | ICD-10-CM | POA: Diagnosis present

## 2020-11-15 DIAGNOSIS — K5900 Constipation, unspecified: Secondary | ICD-10-CM | POA: Diagnosis not present

## 2020-11-15 DIAGNOSIS — J45909 Unspecified asthma, uncomplicated: Secondary | ICD-10-CM | POA: Diagnosis not present

## 2020-11-15 DIAGNOSIS — Z951 Presence of aortocoronary bypass graft: Secondary | ICD-10-CM

## 2020-11-15 DIAGNOSIS — Z6829 Body mass index (BMI) 29.0-29.9, adult: Secondary | ICD-10-CM

## 2020-11-15 DIAGNOSIS — J939 Pneumothorax, unspecified: Secondary | ICD-10-CM

## 2020-11-15 DIAGNOSIS — I252 Old myocardial infarction: Secondary | ICD-10-CM

## 2020-11-15 DIAGNOSIS — I5020 Unspecified systolic (congestive) heart failure: Secondary | ICD-10-CM | POA: Diagnosis not present

## 2020-11-15 DIAGNOSIS — Z7984 Long term (current) use of oral hypoglycemic drugs: Secondary | ICD-10-CM

## 2020-11-15 DIAGNOSIS — Z7902 Long term (current) use of antithrombotics/antiplatelets: Secondary | ICD-10-CM

## 2020-11-15 DIAGNOSIS — Z888 Allergy status to other drugs, medicaments and biological substances status: Secondary | ICD-10-CM

## 2020-11-15 HISTORY — PX: INTRAVASCULAR PRESSURE WIRE/FFR STUDY: CATH118243

## 2020-11-15 HISTORY — PX: CORONARY PRESSURE/FFR STUDY: CATH118243

## 2020-11-15 HISTORY — PX: LEFT HEART CATH AND CORONARY ANGIOGRAPHY: CATH118249

## 2020-11-15 LAB — ECHOCARDIOGRAM COMPLETE
Area-P 1/2: 4.6 cm2
Calc EF: 41.7 %
Height: 68 in
S' Lateral: 4.8 cm
Single Plane A2C EF: 45.7 %
Single Plane A4C EF: 38.2 %
Weight: 2984 oz

## 2020-11-15 LAB — HEPARIN LEVEL (UNFRACTIONATED): Heparin Unfractionated: <0.1 IU/mL — ABNORMAL LOW (ref 0.30–0.70)

## 2020-11-15 LAB — POCT ACTIVATED CLOTTING TIME: Activated Clotting Time: 271 seconds

## 2020-11-15 LAB — GLUCOSE, CAPILLARY
Glucose-Capillary: 156 mg/dL — ABNORMAL HIGH (ref 70–99)
Glucose-Capillary: 192 mg/dL — ABNORMAL HIGH (ref 70–99)
Glucose-Capillary: 129 mg/dL — ABNORMAL HIGH (ref 70–99)
Glucose-Capillary: 152 mg/dL — ABNORMAL HIGH (ref 70–99)

## 2020-11-15 LAB — HEMOGLOBIN A1C
Hgb A1c MFr Bld: 8.6 % — ABNORMAL HIGH (ref 4.8–5.6)
Mean Plasma Glucose: 200.12 mg/dL

## 2020-11-15 SURGERY — LEFT HEART CATH AND CORONARY ANGIOGRAPHY
Anesthesia: LOCAL

## 2020-11-15 MED ORDER — ASPIRIN 81 MG PO CHEW: 81.0000 mg | CHEWABLE_TABLET | ORAL | Status: DC

## 2020-11-15 MED ORDER — FENTANYL CITRATE (PF) 100 MCG/2ML IJ SOLN
INTRAMUSCULAR | Status: DC | PRN
  Administered 2020-11-15: 50 ug via INTRAVENOUS

## 2020-11-15 MED ORDER — INSULIN ASPART 100 UNIT/ML IJ SOLN: 0.0000 [IU] | Freq: Every day | INTRAMUSCULAR | Status: DC

## 2020-11-15 MED ORDER — CARVEDILOL 6.25 MG PO TABS
6.2500 mg | ORAL_TABLET | Freq: Two times a day (BID) | ORAL | Status: DC
  Administered 2020-11-15 – 2020-11-19 (×9): 6.25 mg via ORAL
  Filled 2020-11-15 (×9): qty 1

## 2020-11-15 MED ORDER — ACETAMINOPHEN 500 MG PO TABS
1000.0000 mg | ORAL_TABLET | Freq: Every day | ORAL | Status: DC
  Administered 2020-11-15 – 2020-11-19 (×5): 1000 mg via ORAL
  Filled 2020-11-15 (×5): qty 2

## 2020-11-15 MED ORDER — HEPARIN SODIUM (PORCINE) 1000 UNIT/ML IJ SOLN
INTRAMUSCULAR | Status: DC | PRN
  Administered 2020-11-15 (×2): 4500 [IU] via INTRAVENOUS

## 2020-11-15 MED ORDER — MIDAZOLAM HCL 2 MG/2ML IJ SOLN
INTRAMUSCULAR | Status: DC | PRN
Start: 2020-11-15 — End: 2020-11-15
  Administered 2020-11-15: 1 mg via INTRAVENOUS

## 2020-11-15 MED ORDER — VERAPAMIL HCL 2.5 MG/ML IV SOLN
INTRAVENOUS | Status: DC | PRN
  Administered 2020-11-15: 10 mL via INTRA_ARTERIAL

## 2020-11-15 MED ORDER — HYDRALAZINE HCL 20 MG/ML IJ SOLN: 10.0000 mg | INTRAMUSCULAR | Status: AC | PRN

## 2020-11-15 MED ORDER — RANOLAZINE ER 500 MG PO TB12
1000.0000 mg | ORAL_TABLET | Freq: Two times a day (BID) | ORAL | Status: DC
  Administered 2020-11-15 – 2020-11-19 (×9): 1000 mg via ORAL
  Filled 2020-11-15 (×9): qty 2

## 2020-11-15 MED ORDER — LIDOCAINE HCL (PF) 1 % IJ SOLN
INTRAMUSCULAR | Status: DC | PRN
  Administered 2020-11-15: 2 mL

## 2020-11-15 MED ORDER — SODIUM CHLORIDE 0.9 % WEIGHT BASED INFUSION: 1.0000 mL/kg/h | INTRAVENOUS | Status: DC

## 2020-11-15 MED ORDER — HEPARIN (PORCINE) IN NACL 1000-0.9 UT/500ML-% IV SOLN
INTRAVENOUS | Status: AC
  Filled 2020-11-15: qty 1000

## 2020-11-15 MED ORDER — LABETALOL HCL 5 MG/ML IV SOLN: 10.0000 mg | INTRAVENOUS | Status: AC | PRN

## 2020-11-15 MED ORDER — ACETAMINOPHEN 325 MG PO TABS: 650.0000 mg | ORAL_TABLET | ORAL | Status: DC | PRN

## 2020-11-15 MED ORDER — NITROGLYCERIN 0.4 MG/SPRAY TL SOLN
1.0000 | Status: DC | PRN
  Filled 2020-11-15: qty 12

## 2020-11-15 MED ORDER — IOHEXOL 350 MG/ML SOLN
INTRAVENOUS | Status: DC | PRN
  Administered 2020-11-15: 90 mL

## 2020-11-15 MED ORDER — HEPARIN (PORCINE) 25000 UT/250ML-% IV SOLN
1500.0000 [IU]/h | INTRAVENOUS | Status: DC
  Administered 2020-11-15: 1050 [IU]/h via INTRAVENOUS
  Administered 2020-11-16: 1350 [IU]/h via INTRAVENOUS
  Administered 2020-11-17 (×2): 1500 [IU]/h via INTRAVENOUS
  Filled 2020-11-15 (×4): qty 250

## 2020-11-15 MED ORDER — NITROGLYCERIN 1 MG/10 ML FOR IR/CATH LAB
INTRA_ARTERIAL | Status: DC | PRN
  Administered 2020-11-15 (×2): 200 ug via INTRACORONARY

## 2020-11-15 MED ORDER — NITROGLYCERIN 1 MG/10 ML FOR IR/CATH LAB
INTRA_ARTERIAL | Status: AC
  Filled 2020-11-15: qty 10

## 2020-11-15 MED ORDER — ASPIRIN EC 81 MG PO TBEC
81.0000 mg | DELAYED_RELEASE_TABLET | Freq: Every day | ORAL | Status: DC
  Administered 2020-11-16 – 2020-11-19 (×4): 81 mg via ORAL
  Filled 2020-11-15 (×5): qty 1

## 2020-11-15 MED ORDER — INSULIN ASPART 100 UNIT/ML IJ SOLN
0.0000 [IU] | Freq: Three times a day (TID) | INTRAMUSCULAR | Status: DC
  Administered 2020-11-16: 2 [IU] via SUBCUTANEOUS
  Administered 2020-11-17 (×2): 3 [IU] via SUBCUTANEOUS
  Administered 2020-11-17 – 2020-11-18 (×2): 2 [IU] via SUBCUTANEOUS
  Administered 2020-11-18: 3 [IU] via SUBCUTANEOUS
  Administered 2020-11-18 – 2020-11-19 (×3): 2 [IU] via SUBCUTANEOUS
  Administered 2020-11-19: 8 [IU] via SUBCUTANEOUS

## 2020-11-15 MED ORDER — FENTANYL CITRATE (PF) 100 MCG/2ML IJ SOLN
INTRAMUSCULAR | Status: AC
  Filled 2020-11-15: qty 2

## 2020-11-15 MED ORDER — MIDAZOLAM HCL 2 MG/2ML IJ SOLN
INTRAMUSCULAR | Status: AC
  Filled 2020-11-15: qty 2

## 2020-11-15 MED ORDER — SODIUM CHLORIDE 0.9% FLUSH
3.0000 mL | Freq: Two times a day (BID) | INTRAVENOUS | Status: DC
  Administered 2020-11-16 – 2020-11-19 (×7): 3 mL via INTRAVENOUS

## 2020-11-15 MED ORDER — HEPARIN SODIUM (PORCINE) 1000 UNIT/ML IJ SOLN
INTRAMUSCULAR | Status: AC
  Filled 2020-11-15: qty 1

## 2020-11-15 MED ORDER — SODIUM CHLORIDE 0.9% FLUSH: 3.0000 mL | INTRAVENOUS | Status: DC | PRN

## 2020-11-15 MED ORDER — LIDOCAINE HCL (PF) 1 % IJ SOLN
INTRAMUSCULAR | Status: AC
  Filled 2020-11-15: qty 30

## 2020-11-15 MED ORDER — ATORVASTATIN CALCIUM 80 MG PO TABS
80.0000 mg | ORAL_TABLET | Freq: Every day | ORAL | Status: DC
  Administered 2020-11-15 – 2020-11-24 (×9): 80 mg via ORAL
  Filled 2020-11-15 (×9): qty 1

## 2020-11-15 MED ORDER — DIPHENHYDRAMINE-APAP (SLEEP) 25-500 MG PO TABS: 2.0000 | ORAL_TABLET | Freq: Every day | ORAL | Status: DC

## 2020-11-15 MED ORDER — HEPARIN (PORCINE) IN NACL 1000-0.9 UT/500ML-% IV SOLN
INTRAVENOUS | Status: DC | PRN
  Administered 2020-11-15 (×2): 500 mL

## 2020-11-15 MED ORDER — DIPHENHYDRAMINE HCL 25 MG PO CAPS
50.0000 mg | ORAL_CAPSULE | Freq: Every day | ORAL | Status: DC
  Administered 2020-11-15 – 2020-11-19 (×5): 50 mg via ORAL
  Filled 2020-11-15 (×5): qty 2

## 2020-11-15 MED ORDER — ONDANSETRON HCL 4 MG/2ML IJ SOLN: 4.0000 mg | Freq: Four times a day (QID) | INTRAMUSCULAR | Status: DC | PRN

## 2020-11-15 MED ORDER — ISOSORBIDE MONONITRATE ER 30 MG PO TB24
30.0000 mg | ORAL_TABLET | Freq: Every day | ORAL | Status: DC
  Administered 2020-11-15 – 2020-11-19 (×5): 30 mg via ORAL
  Filled 2020-11-15 (×5): qty 1

## 2020-11-15 MED ORDER — VERAPAMIL HCL 2.5 MG/ML IV SOLN
INTRAVENOUS | Status: AC
  Filled 2020-11-15: qty 2

## 2020-11-15 MED ORDER — SODIUM CHLORIDE 0.9 % IV SOLN
250.0000 mL | INTRAVENOUS | Status: DC | PRN
  Administered 2020-11-17 – 2020-11-18 (×2): 250 mL via INTRAVENOUS

## 2020-11-15 MED ORDER — SODIUM CHLORIDE 0.9 % WEIGHT BASED INFUSION
3.0000 mL/kg/h | INTRAVENOUS | Status: DC
Start: 2020-11-15 — End: 2020-11-15
  Administered 2020-11-15: 3 mL/kg/h via INTRAVENOUS

## 2020-11-15 MED ORDER — RAMIPRIL 2.5 MG PO CAPS
2.5000 mg | ORAL_CAPSULE | Freq: Every morning | ORAL | Status: DC
  Administered 2020-11-16 – 2020-11-17 (×2): 2.5 mg via ORAL
  Filled 2020-11-15 (×3): qty 1

## 2020-11-15 SURGICAL SUPPLY — 12 items
CATH 5FR JL3.5 JR4 ANG PIG MP (CATHETERS) ×2 IMPLANT
CATH LAUNCHER 5F JR4 (CATHETERS) ×2 IMPLANT
CATH LAUNCHER 6FR EBU3.5 (CATHETERS) ×2 IMPLANT
DEVICE RAD COMP TR BAND LRG (VASCULAR PRODUCTS) ×2 IMPLANT
GLIDESHEATH SLEND SS 6F .021 (SHEATH) ×2 IMPLANT
GUIDEWIRE PRESSURE X 175 (WIRE) ×2 IMPLANT
KIT ESSENTIALS PG (KITS) ×2 IMPLANT
KIT HEART LEFT (KITS) ×2 IMPLANT
PACK CARDIAC CATHETERIZATION (CUSTOM PROCEDURE TRAY) ×2 IMPLANT
TRANSDUCER W/STOPCOCK (MISCELLANEOUS) ×2 IMPLANT
TUBING CIL FLEX 10 FLL-RA (TUBING) ×2 IMPLANT
WIRE HI TORQ VERSACORE-J 145CM (WIRE) ×2 IMPLANT

## 2020-11-15 NOTE — Progress Notes (Signed)
  Echocardiogram 2D Echocardiogram has been performed.  [name] 11/15/2020, 4:31 PM

## 2020-11-15 NOTE — Progress Notes (Signed)
TCTS consulted for CABG evaluation. °

## 2020-11-15 NOTE — Progress Notes (Signed)
TR BAND REMOVAL  LOCATION:    Right radial   DEFLATED PER PROTOCOL:    Yes.    TIME BAND OFF / DRESSING APPLIED:    1200p a Clean dry dressing applied, with gauze and tegaderm.  Coban applied  SITE UPON ARRIVAL:    Level 0  SITE AFTER BAND REMOVAL:    Level 0  CIRCULATION SENSATION AND MOVEMENT:    Within Normal Limits   Yes.    COMMENTS:   Care instruction given to patient.

## 2020-11-15 NOTE — Progress Notes (Signed)
ANTICOAGULATION CONSULT NOTE - Follow Up Consult  Pharmacy Consult for IV Heparin Indication: chest pain/ACS  Allergies  Allergen Reactions   Hydroxyzine Hcl Other (See Comments)    Weakness, out of this world feeling. sluggishness   Latex Rash    I.e. Elastic= rash to blisters if worn for an extended time Patient show effects after long exposure.    Patient Measurements: Height: 5\' 8"  (172.7 cm) Weight: 84.6 kg (186 lb 8 oz) IBW/kg (Calculated) : 68.4 Heparin Dosing Weight: 85.7 kg   Vital Signs: Temp: 98.2 F (36.8 C) (07/07 1641) Temp Source: Oral (07/07 1641) BP: 140/81 (07/07 1641) Pulse Rate: 85 (07/07 1641)  Labs: Recent Labs    11/15/20 1942  HEPARINUNFRC <0.10*    Estimated Creatinine Clearance: 71.7 mL/min (by C-G formula based on SCr of 1.03 mg/dL).   Medical History: Past Medical History:  Diagnosis Date   Angina    Asthma    BCC (basal cell carcinoma of skin) 09/06/2020   left upper back lateral (EDC) ,  left upper arm anterior (EDC), left upper back medial (EDC)    Bursitis of left elbow 2018   CAD (coronary artery disease) 04/12/2011   a. remote MI in 1996; b. MI 1997 s/p stenting to LAD & diag; c. MI 2009: long stenting from pLAD to mid LAD and diag & POBA of jailed diag branch; d. cath 2010: 80% ISR of LAD w/ L-R collats, med Rx; e. cath 2012: 50-60% tub mLAD, 60-70% dLAD, FFR 0.75 -->s/p PCI dissection repaired w/ 2 stents (3 total); f. cath 06/2012: no changes from 2012 cath, med Rx, patent stents    Diabetes mellitus without complication [facility])    ED (erectile dysfunction)    Heart disease    HTN (hypertension) 04/12/2011   Hyperlipemia 04/12/2011   Ischemic cardiomyopathy    a. echo 2010: EF 45-50%, mild to mod ant and apical wall HK, trace MR; b. cardiac cath 06/2012: EF 40%, mild MR    Myocardial infarction (HCC)    x 5   Obesity    OSA (obstructive sleep apnea)    on CPAP   Osteoarthritis    Renal disorder    kidney stone     Assessment: 70 yr old man presented for cath; he was on no anticoagulants PTA (pt was on prasugrel PTA).   Cath finding: 3-vessel CAD; CABG planned when prasugrel washed out. Pharmacy was consulted to start IV heparin infusion 2 hours after TR band removal.  Initial heparin level ~6 hrs after starting heparin infusion at 1050 units/hr was undetectable. H/H 15.6/45.6, plt 173 (CBC stable). Per RN, no issues with IV or bleeding observed.  Goal of Therapy:  Heparin level 0.3-0.7 units/ml Monitor platelets by anticoagulation protocol: Yes   Plan:  Increase heparin infusion to 1300 units/hr Check 6-hr heparin level Monitor daily heparin level, CBC Monitor for bleeding  Gillermina Hu, PharmD, BCPS, [facility] Clinical Pharmacist 11/15/2020 8:55 PM

## 2020-11-15 NOTE — Interval H&P Note (Signed)
History and Physical Interval Note:  11/15/2020 7:13 AM  [name]  has presented today for surgery, with the diagnosis of unstable angina.  The various methods of treatment have been discussed with the patient and family. After consideration of risks, benefits and other options for treatment, the patient has consented to  Procedure(s): LEFT HEART CATH AND CORONARY ANGIOGRAPHY (N/A) as a surgical intervention.  The patient's history has been reviewed, patient examined, no change in status, stable for surgery.  I have reviewed the patient's chart and labs.  Questions were answered to the patient's satisfaction.    Cath Lab Visit (complete for each Cath Lab visit)  Clinical Evaluation Leading to the Procedure:   ACS: No.  Non-ACS:    Anginal Classification: CCS IV  Anti-ischemic medical therapy: Maximal Therapy (2 or more classes of medications)  Non-Invasive Test Results: No non-invasive testing performed  Prior CABG: No previous CABG  Kristyna Bradstreet

## 2020-11-15 NOTE — Consult Note (Signed)
[name]       [place] [id number]             [phone]        [name] Medical Record #[id number] Date of Birth: Dec 04, 1950  Referring: No ref. provider found Primary Care: [name], MD Primary Cardiologist:Timothy Rockey Situ, MD  Chief Complaint:   No chief complaint on file.   History of Present Illness:      [name] is a 70 year old male with a past history of coronary artery disease with his first heart attack in 89 and a recurrent MI 38.  He also has a history of hypertension, dyslipidemia, and type 2 diabetes mellitus.  He is a former smoker having quit in 2009.  He is status post multiple percutaneous coronary interventions with several stents being placed to the left anterior descending and diagonal coronary arteries.  He has been followed by cardiology service.  He presented to Dr. Esmond Plants in [place] about 10 days ago with a 3-week history of shortness of breath and exertional chest pain relieved with nitroglycerin.  The patient expressed concern that this was very similar to angina he had in the past and thought he should have a left heart catheterization.  EKG showed normal sinus rhythm with no ST or T wave changes.  Since there was a high likelihood for coronary intervention being necessary, he was scheduled for elective left heart catheterization here in [place].  This was performed earlier today by Dr. Saunders Revel.  This demonstrated an ejection fraction of 25 to 35% with LVEDP elevated at 20.  There was global LV hypokinesis.  Coronary angiography revealed an 80 to 90% ostial LAD stenosis that appears to be at the proximal end of the stent.  Additionally, there was an 80% stenosis in the distal LAD there is a patent stent in the first diagonal.  The left circumflex has a long 70% mid stenosis.  There was nonobstructive disease in the distal right coronary artery. CT surgery has been asked to evaluate [name]  for consideration of surgical coronary revascularization. Currently, [name] is resting in bed in the Cath Lab recovery area.  He denies having any pain or shortness of breath.  Having dealt with his coronary disease for a number of years, he relates that he is not at all surprised that he would come to the point of needing coronary bypass grafting and says he has "come to terms with it". 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  [name] admits his teeth are in poor repair.  He does not have any that are painful to him currently but he has not had any dental care in several years since dental insurance was dropped from his retirement benefits. He has been on Effient for his multiple coronary stents.  His last dose was this morning.    Current Activity/ Functional Status: Patient is independent with mobility/ambulation, transfers, ADL's, IADL's.   Zubrod Score: At the time of surgery this patient's most appropriate activity status/level should be described as: '[]'     0    Normal activity, no symptoms '[x]'     1    Restricted in physical strenuous activity but ambulatory, able to do out light work '[]'     2    Ambulatory and capable of self care, unable to do work activities, up and about  more than 50%  Of the time                            '[]'     3    Only limited self care, in bed greater than 50% of waking hours '[]'     4    Completely disabled, no self care, confined to bed or chair '[]'     5    Moribund  Past Medical History:  Diagnosis Date   Angina    Asthma    BCC (basal cell carcinoma of skin) 09/06/2020   left upper back lateral (EDC) ,  left upper arm anterior (EDC), left upper back medial (EDC)    Bursitis of left elbow 2018   CAD (coronary artery disease) 04/12/2011   a. remote MI in 1996; b. MI 1997 s/p stenting to LAD & diag; c. MI 2009: long stenting from pLAD to mid LAD and diag  & POBA of jailed diag branch; d. cath 2010: 80% ISR of LAD w/ L-R collats, med Rx; e. cath 2012: 50-60% tub mLAD, 60-70% dLAD, FFR 0.75 -->s/p PCI dissection repaired w/ 2 stents (3 total); f. cath 06/2012: no changes from 2012 cath, med Rx, patent stents    Diabetes mellitus without complication [facility])    ED (erectile dysfunction)    Heart disease    HTN (hypertension) 04/12/2011   Hyperlipemia 04/12/2011   Ischemic cardiomyopathy    a. echo 2010: EF 45-50%, mild to mod ant and apical wall HK, trace MR; b. cardiac cath 06/2012: EF 40%, mild MR    Myocardial infarction (HCC)    x 5   Obesity    OSA (obstructive sleep apnea)    on CPAP   Osteoarthritis    Renal disorder    kidney stone    Past Surgical History:  Procedure Laterality Date   CORONARY ANGIOPLASTY     x 5   CORONARY STENT PLACEMENT     x 7   LEFT HEART CATHETERIZATION WITH CORONARY ANGIOGRAM N/A 04/14/2011   Procedure: LEFT HEART CATHETERIZATION WITH CORONARY ANGIOGRAM;  Surgeon: Pixie Casino, MD;  Location: [facility] CATH LAB;  Service: Cardiovascular;  Laterality: N/A;   LEFT HEART CATHETERIZATION WITH CORONARY ANGIOGRAM N/A 06/25/2012   Procedure: LEFT HEART CATHETERIZATION WITH CORONARY ANGIOGRAM;  Surgeon: Peter M Martinique, MD;  Location: [facility] CATH LAB;  Service: Cardiovascular;  Laterality: N/A;   TONSILLECTOMY AND ADENOIDECTOMY  1955    Social History   Tobacco Use  Smoking Status Former   Pack years: 0.00   Types: Cigarettes   Quit date: 09/14/2007   Years since quitting: 13.1  Smokeless Tobacco Never  Tobacco Comments   quit 2009    Social History   Substance and Sexual Activity  Alcohol Use No   Alcohol/week: 0.0 standard drinks   Comment: rarely 1 beer in last 5 years     Allergies  Allergen Reactions   Hydroxyzine Hcl Other (See Comments)    Weakness, out of this world feeling. sluggishness   Latex Rash    I.e. Elastic= rash to blisters if worn for an extended time Patient show effects after long  exposure.    Current Facility-Administered Medications  Medication Dose Route Frequency Provider Last Rate Last Admin   0.9 %  sodium chloride infusion  250 mL Intravenous PRN End, Christopher, MD 10 mL/hr at 11/15/20 0930 Rate Change at 11/15/20 0930   0.9% sodium chloride infusion  1  mL/kg/hr Intravenous Continuous Minna Merritts, MD 88.2 mL/hr at 11/15/20 0734 1 mL/kg/hr at 11/15/20 0734   aspirin chewable tablet 81 mg  81 mg Oral Pre-Cath Gollan, Kathlene November, MD        Medications Prior to Admission  Medication Sig Dispense Refill Last Dose   aspirin EC 81 MG tablet Take 1 tablet (81 mg total) by mouth daily. 90 tablet 3 11/15/2020 at 0430   Aspirin-Caffeine (BAYER BACK & BODY) 500-32.5 MG TABS Take 2 tablets by mouth daily as needed (pain).   Past Week   atorvastatin (LIPITOR) 80 MG tablet Take 1 tablet (80 mg total) by mouth daily. 90 tablet 1 Past Month   carvedilol (COREG) 6.25 MG tablet TAKE 1 TABLET TWICE A DAY WITH MEALS (Patient taking differently: Take 6.25 mg by mouth 2 (two) times daily with a meal.) 180 tablet 2 11/14/2020   diphenhydramine-acetaminophen (TYLENOL PM) 25-500 MG TABS tablet Take 2 tablets by mouth at bedtime.   11/14/2020   isosorbide mononitrate (IMDUR) 30 MG 24 hr tablet Take 1 tablet (30 mg total) by mouth daily. 90 tablet 3 11/14/2020   JANUVIA 100 MG tablet TAKE 1 TABLET DAILY (Patient taking differently: Take 100 mg by mouth daily.) 90 tablet 3 11/14/2020   JARDIANCE 25 MG TABS tablet TAKE 1 TABLET DAILY (Patient taking differently: Take 25 mg by mouth daily.) 90 tablet 3 11/14/2020   metFORMIN (GLUCOPHAGE) 1000 MG tablet TAKE 1 TABLET TWICE A DAY WITH A MEAL (Patient taking differently: Take 1,000 mg by mouth 2 (two) times daily with a meal.) 180 tablet 3 11/14/2020   Multiple Vitamins-Minerals (MULTIVITAMINS THER. W/MINERALS) TABS Take 1 tablet by mouth every morning.   11/14/2020   niacin (NIASPAN) 1000 MG CR tablet TAKE 2 TABLETS DAILY AT BEDTIME (Patient taking  differently: Take 2,000 mg by mouth at bedtime.) 180 tablet 2 11/14/2020   nitroGLYCERIN (NITROLINGUAL) 0.4 MG/SPRAY spray USE 1 SPRAY ON OR UNDER TONGUE EVERY 5 MINUTES AS NEEDED FOR CHEST PAIN. (Patient taking differently: Place 1 spray under the tongue every 5 (five) minutes x 3 doses as needed for chest pain.) 12 g 2 Past Week   prasugrel (EFFIENT) 10 MG TABS tablet TAKE 1 TABLET DAILY (Patient taking differently: Take 10 mg by mouth daily.) 90 tablet 2 11/15/2020 at 0430   ramipril (ALTACE) 2.5 MG capsule Take 1 capsule (2.5 mg total) by mouth every morning. 90 capsule 3 11/14/2020   ranolazine (RANEXA) 1000 MG SR tablet TAKE 1 TABLET TWICE A DAY (Patient taking differently: Take 1,000 mg by mouth 2 (two) times daily.) 180 tablet 2 11/14/2020   blood glucose meter kit and supplies KIT Dispense based on patient and insurance preference. Use once daily fasting. (FOR ICD 10 DIAGNOSIS OF E11.59). 1 each 0    diclofenac sodium (VOLTAREN) 1 % GEL Apply topically to affected area qid (Patient not taking: Reported on 11/13/2020) 100 g 1 Completed Course   ketoconazole (NIZORAL) 2 % shampoo Apply 1 application topically 2 (two) times a week. (Patient not taking: Reported on 11/13/2020) 100 mL 0 Completed Course   Lactobacillus (ACIDOPHILUS PROBIOTIC) 100 MG CAPS Take 1 capsule (100 mg total) by mouth daily. (Patient not taking: Reported on 11/13/2020) 15 capsule 0 Completed Course   mupirocin ointment (BACTROBAN) 2 % Apply 1 application topically daily. To affected areas of arm and back until completely healed (Patient not taking: Reported on 11/13/2020) 22 g 0 Completed Course   triamcinolone cream (KENALOG) 0.1 % Apply 1  application topically 2 (two) times daily. (Patient not taking: Reported on 11/13/2020) 30 g 0 Completed Course    Family History  Problem Relation Age of Onset   COPD Mother    Other Father        muscle tumor   Stroke Father    Prostate cancer Father        mets   Heart disease Father     Alcoholism Father    Stroke Sister    Heart block Sister        Heart bypass   Bladder Cancer Neg Hx    Kidney cancer Neg Hx      Review of Systems:      Cardiac Review of Systems: Y or  [    ]= no  Chest Pain [  x  ]  Resting SOB [   ] Exertional SOB  [ x ]  Orthopnea [  ]   Pedal Edema [   ]    Palpitations [  ] Syncope  [  ]   Presyncope [   ]  General Review of Systems: [Y] = yes [  ]=no Constitional: recent weight change [  ]; anorexia [  ]; fatigue [  ]; nausea [  ]; night sweats [  ]; fever [  ]; or chills [  ]                                                               Dental: Last Dentist visit: years ago.  Eye : blurred vision [  ]; diplopia [   ]; vision changes [  ];  Amaurosis fugax[  ]; Resp: cough [  ];  wheezing[  ];  hemoptysis[  ]; shortness of breath[ x ]; paroxysmal nocturnal dyspnea[  ]; dyspnea on exertion[  ]; or orthopnea[  ];  GI:  gallstones[  ], vomiting[  ];  dysphagia[  ]; melena[  ];  hematochezia [  ]; heartburn[  ];   Hx of  Colonoscopy[  ]; GU: kidney stones [  ]; hematuria[  ];   dysuria [  ];  nocturia[  ];  history of     obstruction [  ]; urinary frequency [  ]             Skin: rash, swelling[  ];, hair loss[  ];  peripheral edema[  ];  or itching[  ]; Musculosketetal: myalgias[  ];  joint swelling[  ];  joint erythema[  ];  joint pain[  ];  back pain[  ];  Heme/Lymph: bruising[  ];  bleeding[  ];  anemia[  ];  Neuro: TIA[  ];  headaches[  ];  stroke[  ];  vertigo[  ];  seizures[  ];   paresthesias[  ];  difficulty walking[  ];  Psych:depression[  ]; anxiety[  ];  Endocrine: diabetes[ x ];  thyroid dysfunction[  ];                  Physical Exam: BP (!) 141/73   Pulse 82   Temp 97.7 F (36.5 C) (Oral)   Resp 17   Ht '5\' 8"'  (1.727 m)   Wt 86.2 kg   SpO2 97%   BMI 28.89 kg/m    General  appearance: alert, cooperative, and no distress Head: Normocephalic, without obvious abnormality, atraumatic Neck: no adenopathy, no carotid bruit,  no JVD, supple, symmetrical, trachea midline, and thyroid not enlarged, symmetric, no tenderness/mass/nodules Resp: clear to auscultation bilaterally Cardio: regular rate and rhythm, S1, S2 normal, no murmur, click, rub or gallop GI: soft, non-tender; bowel sounds normal; no masses,  no organomegaly Extremities: extremities normal, atraumatic, no cyanosis or edema and modified Allen's test conducted with pulse oximetry on both the right and left thumbs showed no change in the waveform with compression of the right (dominant) radial artery but there was significant dampening of the waveform on the left with compression of the radial artery. Neurologic: Grossly normal      Recent Radiology Findings:   Diagnostic Studies & Laboratory data:   Procedures  INTRAVASCULAR PRESSURE WIRE/FFR STUDY  LEFT HEART CATH AND CORONARY ANGIOGRAPHY    Conclusion  Conclusions: Three-vessel coronary artery disease with 90% ostial/proximal LAD stenosis that appears to be within old stent though heavy calcification confounds visualization, long proximal through distal LCx disease of up to 70% that is hemodynamically significant (RFR = 0.76), and 50% distal RCA stenosis that is borderline significant (RFR = 0.90). Severely reduced left ventricular systolic function (LVEF 03-50%) with mildly elevated filling pressure (LVEDP ~20 mmHg).   Recommendations: Admit for observation and cardiac surgery consultation for CABG, given worsening chest pain (including at rest prior to today's catheterization), reduced LVEF, and diabetes mellitus. Obtain echocardiogram. Hold prasugrel pending cardiac surgery consultation.  Start IV heparin 2 hours after TR band removal.   Nelva Bush, MD [facility]    Recommendations  Antiplatelet/Anticoag Recommend dual antiplatelet therapy. Hold prasugrel pending cardiac surgery consultation.  Start IV heparin 2 hours after TR band removal.  Discharge Date Anticipated discharge  date to be determined.   Indications  Unstable angina (HCC) [I20.0 (ICD-10-CM)]   Procedural Details  Technical Details Indication: 70 y.o. year-old man with history of CAD s/p multiple stents to the LAD/D1, ischemic cardiomyopathy, hypertension, hyperlipidemia, type 2 diabetes mellitus, and obstructive sleep apnea, presenting with worsening chest pain over the last month concerning for unstable angina.  He complained of mild chest tightness at rest in the preop area this morning.  GFR: >60 ml/min  Procedure: The risks, benefits, complications, treatment options, and expected outcomes were discussed with the patient. The patient and/or family concurred with the proposed plan, giving informed consent. The patient was sedated with IV midazolam and fentanyl. The right wrist was prepped and draped in a sterile fashion. 1% lidocaine was used for local anesthesia. Using the modified Seldinger access technique, a 72F slender Glidesheath was placed in the right radial artery. 3 mg Verapamil was given through the sheath. Heparin 4,500 units were administered.  Selective coronary angiography was performed using a 59F JL3.5 catheter to engage the left coronary artery and a 59F JR4 catheter to engage the right coronary artery. Left heart catheterization was performed using a 59F JR4 catheter. Left ventriculogram was performed with a hand injection of contrast.  RFR of LCx: Heparin was used for anticoagulation.  The left coronary artery was engaged with a 72F EBU3.5 guide catheter and a Pressure-X wire advanced into OM3.  After administration of intracoronary nitroglycerin, RFR was performed (RFR = 0.76).  Final angiogram demonstrates stable appearance of the left coronary artery.  RFR of RCA: Heparin was used for anticoagulation.  The right coronary artery was engaged with a 59F JR4 guide catheter and the Pressure-X wire advanced in the rPDA.  After administration of intracoronary nitroglycerin, RFR was performed  (RFR = 0.90).  Final angiogram demonstrates stable appearance of the right coronary artery.  At the end of the procedure, the radial artery sheath was removed and a TR band applied to achieve patent hemostasis. There were no immediate complications. The patient was taken to the recovery area in stable condition.  Estimated blood loss <50 mL.   During this procedure medications were administered to achieve and maintain moderate conscious sedation while the patient's heart rate, blood pressure, and oxygen saturation were continuously monitored and I was present face-to-face 100% of this time.   Medications (Filter: Administrations occurring from 0728 to 0845 on 11/15/20)  important  Continuous medications are totaled by the amount administered until 11/15/20 0845.    fentaNYL (SUBLIMAZE) injection (mcg) Total dose:  50 mcg  Date/Time Rate/Dose/Volume Action   11/15/20 0738 50 mcg Given    midazolam (VERSED) injection (mg) Total dose:  1 mg  Date/Time Rate/Dose/Volume Action   11/15/20 0738 1 mg Given    lidocaine (PF) (XYLOCAINE) 1 % injection (mL) Total volume:  2 mL  Date/Time Rate/Dose/Volume Action   11/15/20 0744 2 mL Given    Heparin (Porcine) in NaCl 1000-0.9 UT/500ML-% SOLN (mL) Total volume:  1,000 mL  Date/Time Rate/Dose/Volume Action   11/15/20 0744 500 mL Given   0744 500 mL Given    Radial Cocktail/Verapamil only (mL) Total volume:  10 mL  Date/Time Rate/Dose/Volume Action   11/15/20 0746 10 mL Given    heparin sodium (porcine) injection (Units) Total dose:  9,000 Units  Date/Time Rate/Dose/Volume Action   11/15/20 0747 4,500 Units Given   0800 4,500 Units Given    nitroGLYCERIN 1 mg/10 mL (100 mcg/mL) - IR/CATH LAB (mcg) Total dose:  400 mcg  Date/Time Rate/Dose/Volume Action   11/15/20 0811 200 mcg Given   0822 200 mcg Given    iohexol (OMNIPAQUE) 350 MG/ML injection (mL) Total volume:  90 mL  Date/Time Rate/Dose/Volume Action   11/15/20 0831  90 mL Given    Sedation Time  Sedation Time Physician-1: 51 minutes 13 seconds   Contrast  Medication Name Total Dose  iohexol (OMNIPAQUE) 350 MG/ML injection 90 mL    Radiation/Fluoro  Fluoro time: 9 (min) DAP: 54008 (mGycm2) Cumulative Air Kerma: 676 (mGy)   Complications   Complications documented before study signed (11/15/2020  1:95 AM)    No complications were associated with this study.  Documented by Nelva Bush, MD - 11/15/2020  9:13 AM     Coronary Findings   Diagnostic Dominance: Right  Left Main  Vessel is large.  Mid LM to Dist LM lesion is 25% stenosed.  Left Anterior Descending  Vessel is large.  Ost LAD to Prox LAD lesion is 90% stenosed. The lesion is severely calcified. The lesion was previously treatedover 2 years ago.  Mid LAD lesion is 25% stenosed.  Dist LAD lesion is 80% stenosed.  First Diagonal Branch  Vessel is large in size.  1st Diag lesion is 10% stenosed. The lesion was previously treated.  Left Circumflex  Vessel is large.  Prox Cx to Dist Cx lesion is 70% stenosed. The lesion is irregular. Pressure wire/FFR was performed on the lesion. RFR = 0.76.  First Obtuse Marginal Branch  Vessel is moderate in size.  1st Mrg lesion is 50% stenosed.  Second Obtuse Marginal Branch  Vessel is small in size.  Third Obtuse Marginal Branch  Vessel is moderate in size.  3rd Mrg lesion  is 50% stenosed.  Right Coronary Artery  Vessel is large. There is mild diffuse disease throughout the vessel.  Dist RCA lesion is 50% stenosed. Pressure wire/FFR was performed on the lesion. RFR = 0.90.  Right Posterior Descending Artery  Vessel is small in size.  RPDA lesion is 40% stenosed.  Right Posterior Atrioventricular Artery  Vessel is moderate in size.  RPAV lesion is 40% stenosed.  First Right Posterolateral Branch  Vessel is small in size.  Second Right Posterolateral Branch  Vessel is small in size.   Intervention   No interventions  have been documented.  Wall Motion  Resting                Left Heart  Left Ventricle The left ventricular size is normal. There is severe left ventricular systolic dysfunction. LV end diastolic pressure is mildly elevated. LVEDP ~20 mmHg. The left ventricular ejection fraction is 25-35% by visual estimate. There are LV function abnormalities due to global hypokinesis.  Aortic Valve There is no aortic valve stenosis.   Coronary Diagrams   Diagnostic Dominance: Right        I have independently reviewed the above radiologic studies and discussed with the patient   Recent Lab Findings: Lab Results  Component Value Date   WBC 6.6 11/06/2020   HGB 15.6 11/06/2020   HCT 45.6 11/06/2020   PLT 173 11/06/2020   GLUCOSE 175 (H) 11/06/2020   CHOL 125 07/11/2019   TRIG 288.0 (H) 07/11/2019   HDL 30.40 (L) 07/11/2019   LDLDIRECT 71.0 07/11/2019   LDLCALC 68 02/04/2018   ALT 25 07/11/2019   AST 20 07/11/2019   NA 139 11/06/2020   K 4.5 11/06/2020   CL 101 11/06/2020   CREATININE 1.03 11/06/2020   BUN 22 11/06/2020   CO2 23 11/06/2020   TSH 0.919 11/22/2014   INR 1.1 (H) 06/23/2012   HGBA1C 7.8 (H) 02/02/2020      Assessment / Plan:    70 year old male with known coronary artery disease along with hypertension, dyslipidemia, and type 2 diabetes mellitus presented for elective left heart catheterization after having noticed recurrent exertional angina beginning about 1 month ago.  Findings of left heart catheterization are detailed above and were discussed with the patient.  Surgical coronary revascularization is his best option.  He is agreeable to proceed with surgery and understands that Effient washout will be required prior to proceeding with surgery.  We will proceed with preoperative work-up to include carotid duplex scan, screening lower extremity arterial duplex, and echocardiogram.  He will be evaluated later by Dr. Orvan Seen and timing of surgery will be  determined.      I  spent 20 minutes counseling the patient face to face.  Antony Odea, PA-C  11/15/2020 11:46 AM

## 2020-11-15 NOTE — Progress Notes (Signed)
ANTICOAGULATION CONSULT NOTE - Initial Consult  Pharmacy Consult for heparin Indication: chest pain/ACS  Allergies  Allergen Reactions   Hydroxyzine Hcl Other (See Comments)    Weakness, out of this world feeling. sluggishness   Latex Rash    I.e. Elastic= rash to blisters if worn for an extended time Patient show effects after long exposure.    Patient Measurements: Height: 5\' 8"  (172.7 cm) Weight: 86.2 kg (190 lb) IBW/kg (Calculated) : 68.4 Heparin Dosing Weight: 85.7 kg   Vital Signs: Temp: 97.7 F (36.5 C) (07/07 0549) Temp Source: Oral (07/07 0549) BP: 141/73 (07/07 1050) Pulse Rate: 82 (07/07 1050)  Labs: No results for input(s): HGB, HCT, PLT, APTT, LABPROT, INR, HEPARINUNFRC, HEPRLOWMOCWT, CREATININE, CKTOTAL, CKMB, TROPONINIHS in the last 72 hours.  Estimated Creatinine Clearance: 72.3 mL/min (by C-G formula based on SCr of 1.03 mg/dL).   Medical History: Past Medical History:  Diagnosis Date   Angina    Asthma    BCC (basal cell carcinoma of skin) 09/06/2020   left upper back lateral (EDC) ,  left upper arm anterior (EDC), left upper back medial (EDC)    Bursitis of left elbow 2018   CAD (coronary artery disease) 04/12/2011   a. remote MI in 1996; b. MI 1997 s/p stenting to LAD & diag; c. MI 2009: long stenting from pLAD to mid LAD and diag & POBA of jailed diag branch; d. cath 2010: 80% ISR of LAD w/ L-R collats, med Rx; e. cath 2012: 50-60% tub mLAD, 60-70% dLAD, FFR 0.75 -->s/p PCI dissection repaired w/ 2 stents (3 total); f. cath 06/2012: no changes from 2012 cath, med Rx, patent stents    Diabetes mellitus without complication [facility])    ED (erectile dysfunction)    Heart disease    HTN (hypertension) 04/12/2011   Hyperlipemia 04/12/2011   Ischemic cardiomyopathy    a. echo 2010: EF 45-50%, mild to mod ant and apical wall HK, trace MR; b. cardiac cath 06/2012: EF 40%, mild MR    Myocardial infarction (HCC)    x 5   Obesity    OSA (obstructive sleep  apnea)    on CPAP   Osteoarthritis    Renal disorder    kidney stone    Medications:  Scheduled:   aspirin  81 mg Oral Pre-Cath    Assessment: 50 yom presented for cath - no AC PTA. On prasugrel PTA.  Cath finding 3v CAD - needs CABG eval. Plan to start heparin infusion 2 hours after TR band removal (documented on 7/7@1148 ).   No s/sx of bleeding.   Goal of Therapy:  Heparin level 0.3-0.7 units/ml Monitor platelets by anticoagulation protocol: Yes   Plan:  Start heparin infusion at 1050 units/hr on 7/7@1400  Order heparin level in 6 hours Monitor daily HL, CBC, and for s/sx of bleeding  Antonietta Jewel, PharmD, [place] Pharmacist  Phone: [phone] 11/15/2020 12:50 PM  Please check AMION for all [place] phone numbers After 10:00 PM, call [place] [phone]

## 2020-11-16 ENCOUNTER — Encounter (HOSPITAL_COMMUNITY): Payer: Self-pay | Admitting: Internal Medicine

## 2020-11-16 ENCOUNTER — Other Ambulatory Visit (HOSPITAL_COMMUNITY): Payer: Medicare Other

## 2020-11-16 DIAGNOSIS — I200 Unstable angina: Secondary | ICD-10-CM

## 2020-11-16 DIAGNOSIS — I2510 Atherosclerotic heart disease of native coronary artery without angina pectoris: Secondary | ICD-10-CM | POA: Diagnosis not present

## 2020-11-16 DIAGNOSIS — I25110 Atherosclerotic heart disease of native coronary artery with unstable angina pectoris: Secondary | ICD-10-CM | POA: Diagnosis not present

## 2020-11-16 DIAGNOSIS — Z20822 Contact with and (suspected) exposure to covid-19: Secondary | ICD-10-CM | POA: Diagnosis not present

## 2020-11-16 DIAGNOSIS — Z951 Presence of aortocoronary bypass graft: Secondary | ICD-10-CM | POA: Diagnosis not present

## 2020-11-16 DIAGNOSIS — I255 Ischemic cardiomyopathy: Secondary | ICD-10-CM | POA: Diagnosis present

## 2020-11-16 DIAGNOSIS — M1990 Unspecified osteoarthritis, unspecified site: Secondary | ICD-10-CM | POA: Diagnosis present

## 2020-11-16 DIAGNOSIS — Z79899 Other long term (current) drug therapy: Secondary | ICD-10-CM | POA: Diagnosis not present

## 2020-11-16 DIAGNOSIS — Z7982 Long term (current) use of aspirin: Secondary | ICD-10-CM | POA: Diagnosis not present

## 2020-11-16 DIAGNOSIS — E1169 Type 2 diabetes mellitus with other specified complication: Secondary | ICD-10-CM | POA: Diagnosis not present

## 2020-11-16 DIAGNOSIS — G4733 Obstructive sleep apnea (adult) (pediatric): Secondary | ICD-10-CM | POA: Diagnosis present

## 2020-11-16 DIAGNOSIS — D62 Acute posthemorrhagic anemia: Secondary | ICD-10-CM | POA: Diagnosis not present

## 2020-11-16 DIAGNOSIS — E1165 Type 2 diabetes mellitus with hyperglycemia: Secondary | ICD-10-CM | POA: Diagnosis not present

## 2020-11-16 DIAGNOSIS — J9811 Atelectasis: Secondary | ICD-10-CM | POA: Diagnosis not present

## 2020-11-16 DIAGNOSIS — E1159 Type 2 diabetes mellitus with other circulatory complications: Secondary | ICD-10-CM | POA: Diagnosis not present

## 2020-11-16 DIAGNOSIS — K5900 Constipation, unspecified: Secondary | ICD-10-CM | POA: Diagnosis not present

## 2020-11-16 DIAGNOSIS — J45909 Unspecified asthma, uncomplicated: Secondary | ICD-10-CM | POA: Diagnosis not present

## 2020-11-16 DIAGNOSIS — I340 Nonrheumatic mitral (valve) insufficiency: Secondary | ICD-10-CM | POA: Diagnosis not present

## 2020-11-16 DIAGNOSIS — Z7902 Long term (current) use of antithrombotics/antiplatelets: Secondary | ICD-10-CM | POA: Diagnosis not present

## 2020-11-16 DIAGNOSIS — D696 Thrombocytopenia, unspecified: Secondary | ICD-10-CM | POA: Diagnosis not present

## 2020-11-16 DIAGNOSIS — I252 Old myocardial infarction: Secondary | ICD-10-CM | POA: Diagnosis not present

## 2020-11-16 DIAGNOSIS — I5043 Acute on chronic combined systolic (congestive) and diastolic (congestive) heart failure: Secondary | ICD-10-CM | POA: Diagnosis not present

## 2020-11-16 DIAGNOSIS — Z6829 Body mass index (BMI) 29.0-29.9, adult: Secondary | ICD-10-CM | POA: Diagnosis not present

## 2020-11-16 DIAGNOSIS — I493 Ventricular premature depolarization: Secondary | ICD-10-CM | POA: Diagnosis present

## 2020-11-16 DIAGNOSIS — I159 Secondary hypertension, unspecified: Secondary | ICD-10-CM | POA: Diagnosis not present

## 2020-11-16 DIAGNOSIS — Y831 Surgical operation with implant of artificial internal device as the cause of abnormal reaction of the patient, or of later complication, without mention of misadventure at the time of the procedure: Secondary | ICD-10-CM | POA: Diagnosis present

## 2020-11-16 DIAGNOSIS — Z7984 Long term (current) use of oral hypoglycemic drugs: Secondary | ICD-10-CM | POA: Diagnosis not present

## 2020-11-16 DIAGNOSIS — J90 Pleural effusion, not elsewhere classified: Secondary | ICD-10-CM | POA: Diagnosis not present

## 2020-11-16 DIAGNOSIS — T82855A Stenosis of coronary artery stent, initial encounter: Secondary | ICD-10-CM | POA: Diagnosis not present

## 2020-11-16 DIAGNOSIS — I10 Essential (primary) hypertension: Secondary | ICD-10-CM | POA: Diagnosis not present

## 2020-11-16 DIAGNOSIS — E785 Hyperlipidemia, unspecified: Secondary | ICD-10-CM | POA: Diagnosis present

## 2020-11-16 DIAGNOSIS — I700 Atherosclerosis of aorta: Secondary | ICD-10-CM | POA: Diagnosis not present

## 2020-11-16 DIAGNOSIS — I25118 Atherosclerotic heart disease of native coronary artery with other forms of angina pectoris: Secondary | ICD-10-CM | POA: Diagnosis not present

## 2020-11-16 DIAGNOSIS — I509 Heart failure, unspecified: Secondary | ICD-10-CM | POA: Diagnosis not present

## 2020-11-16 DIAGNOSIS — Z01810 Encounter for preprocedural cardiovascular examination: Secondary | ICD-10-CM | POA: Diagnosis not present

## 2020-11-16 DIAGNOSIS — E669 Obesity, unspecified: Secondary | ICD-10-CM | POA: Diagnosis not present

## 2020-11-16 DIAGNOSIS — I110 Hypertensive heart disease with heart failure: Secondary | ICD-10-CM | POA: Diagnosis not present

## 2020-11-16 DIAGNOSIS — J029 Acute pharyngitis, unspecified: Secondary | ICD-10-CM | POA: Diagnosis not present

## 2020-11-16 LAB — GLUCOSE, CAPILLARY
Glucose-Capillary: 168 mg/dL — ABNORMAL HIGH (ref 70–99)
Glucose-Capillary: 116 mg/dL — ABNORMAL HIGH (ref 70–99)
Glucose-Capillary: 141 mg/dL — ABNORMAL HIGH (ref 70–99)
Glucose-Capillary: 154 mg/dL — ABNORMAL HIGH (ref 70–99)

## 2020-11-16 LAB — BASIC METABOLIC PANEL
Anion gap: 7 (ref 5–15)
BUN: 15 mg/dL (ref 8–23)
CO2: 23 mmol/L (ref 22–32)
Calcium: 9.1 mg/dL (ref 8.9–10.3)
Chloride: 106 mmol/L (ref 98–111)
Creatinine, Ser: 0.87 mg/dL (ref 0.61–1.24)
GFR, Estimated: >60 mL/min (ref >60)
Glucose, Bld: 134 mg/dL — ABNORMAL HIGH (ref 70–99)
Potassium: 3.7 mmol/L (ref 3.5–5.1)
Sodium: 136 mmol/L (ref 135–145)

## 2020-11-16 LAB — SARS CORONAVIRUS 2 (TAT 6-24 HRS): SARS Coronavirus 2: NEGATIVE

## 2020-11-16 LAB — HEPARIN LEVEL (UNFRACTIONATED)
Heparin Unfractionated: 0.3 IU/mL (ref 0.30–0.70)
Heparin Unfractionated: 0.22 IU/mL — ABNORMAL LOW (ref 0.30–0.70)
Heparin Unfractionated: 0.23 IU/mL — ABNORMAL LOW (ref 0.30–0.70)
Heparin Unfractionated: 0.45 IU/mL (ref 0.30–0.70)

## 2020-11-16 NOTE — Progress Notes (Signed)
1400 Came to see to walk. Pt asleep with CPAP. Will continue to follow. Graylon Good RN BSN 11/16/2020 2:02 PM

## 2020-11-16 NOTE — Progress Notes (Signed)
1105-1140 Pt already had IS and was able to demonstrate 2562ml correctly. Discussed importance of IS and mobility after surgery. Discussed staying in the tube and sternal precautions.Gave OHS booklet, care guide , and wrote down how to view pre op video. Wife will be available to assist in care after discharge. Pt received lunch tray.  Will return to walk later as time permits. Graylon Good RN BSN 11/16/2020 11:47 AM

## 2020-11-16 NOTE — Progress Notes (Signed)
Progress Note  Patient Name: [name] Date of Encounter: 11/16/2020  [facility] Cardiologist: [name], MD   Subjective   Mild chest pain last night. No chest pain this am. NO dyspnea  Inpatient Medications    Scheduled Meds:  diphenhydrAMINE  50 mg Oral QHS   And   acetaminophen  1,000 mg Oral QHS   aspirin EC  81 mg Oral Daily   atorvastatin  80 mg Oral Daily   carvedilol  6.25 mg Oral BID WC   insulin aspart  0-15 Units Subcutaneous TID WC   insulin aspart  0-5 Units Subcutaneous QHS   isosorbide mononitrate  30 mg Oral Daily   ramipril  2.5 mg Oral q morning   ranolazine  1,000 mg Oral BID   sodium chloride flush  3 mL Intravenous Q12H   Continuous Infusions:  sodium chloride 10 mL/hr at 11/15/20 0930   heparin 1,350 Units/hr (11/16/20 0404)   PRN Meds: sodium chloride, acetaminophen, nitroGLYCERIN, ondansetron (ZOFRAN) IV, sodium chloride flush   Vital Signs    Vitals:   11/15/20 2242 11/16/20 0217 11/16/20 0534 11/16/20 0741  BP:  107/66 105/74 109/65  Pulse: 76 72 76   Resp: 18 18 17    Temp:  (!) 96.6 F (35.9 C) 97.9 F (36.6 C)   TempSrc:  Axillary Oral   SpO2: 96% 97% 98%   Weight:      Height:        Intake/Output Summary (Last 24 hours) at 11/16/2020 0952 Last data filed at 11/16/2020 0300 Gross per 24 hour  Intake 564.68 ml  Output --  Net 564.68 ml   Last 3 Weights 11/15/2020 11/15/2020 11/06/2020  Weight (lbs) 186 lb 8 oz 190 lb 194 lb 6 oz  Weight (kg) 84.596 kg 86.183 kg 88.168 kg      Telemetry    Sinus- Personally Reviewed  ECG    NSR, inferior Q waves- Personally Reviewed  Physical Exam   GEN: No acute distress.   Neck: No JVD Cardiac: RRR, no murmurs, rubs, or gallops.  Respiratory: Clear to auscultation bilaterally. GI: Soft, nontender, non-distended  MS: No edema; No deformity. Neuro:  Nonfocal  Psych: Normal affect   Labs    High Sensitivity Troponin:  No results for input(s): TROPONINIHS in the  last 720 hours.    Chemistry Recent Labs  Lab 11/16/20 0304  NA 136  K 3.7  CL 106  CO2 23  GLUCOSE 134*  BUN 15  CREATININE 0.87  CALCIUM 9.1  GFRNONAA >60  ANIONGAP 7     HematologyNo results for input(s): WBC, RBC, HGB, HCT, MCV, MCH, MCHC, RDW, PLT in the last 168 hours.  BNPNo results for input(s): BNP, PROBNP in the last 168 hours.   DDimer No results for input(s): DDIMER in the last 168 hours.   Radiology    CARDIAC CATHETERIZATION  Result Date: 11/15/2020 Conclusions: 1. Three-vessel coronary artery disease with 90% ostial/proximal LAD stenosis that appears to be within old stent though heavy calcification confounds visualization, long proximal through distal LCx disease of up to 70% that is hemodynamically significant (RFR = 0.76), and 50% distal RCA stenosis that is borderline significant (RFR = 0.90). 2. Severely reduced left ventricular systolic function (LVEF 93-23%) with mildly elevated filling pressure (LVEDP ~20 mmHg). Recommendations: 1. Admit for observation and cardiac surgery consultation for CABG, given worsening chest pain (including at rest prior to today's catheterization), reduced LVEF, and diabetes mellitus. 2. Obtain echocardiogram. 3. Hold  prasugrel pending cardiac surgery consultation.  Start IV heparin 2 hours after TR band removal. Nelva Bush, MD [facility]   ECHOCARDIOGRAM COMPLETE  Result Date: 11/15/2020    ECHOCARDIOGRAM REPORT   Patient Name:   [name] [facility] Date of Exam: 11/15/2020 Medical Rec #:  [id number]            Height:       68.0 in Accession #:    [id number]           Weight:       186.5 lb Date of Birth:  1950-11-27            BSA:          1.984 m Patient Age:    70 years             BP:           146/88 mmHg Patient Gender: M                    HR:           80 bpm. Exam Location:  Inpatient Procedure: 2D Echo Indications:    acute ischemic heart disease  History:        Patient has prior history of Echocardiogram  examinations, most                 recent 05/25/2008. Cardiomyopathy, CAD, Signs/Symptoms:Chest                 Pain; Risk Factors:Hypertension, Dyslipidemia, Diabetes and                 Sleep Apnea.  Sonographer:    Johny Chess Referring Phys: [place]  1. Left ventricular ejection fraction, by estimation, is 30 to 35%. The left ventricle has moderately decreased function. The left ventricle demonstrates regional wall motion abnormalities with mid to apical anteroseptal and inferoseptal akinesis. Akinesis of the apical anterior, apical lateral, and apical inferior walls as well as the true apex. Left ventricular diastolic parameters are consistent with Grade I diastolic dysfunction (impaired relaxation).  2. Right ventricular systolic function is normal. The right ventricular size is normal. Tricuspid regurgitation signal is inadequate for assessing PA pressure.  3. Left atrial size was mildly dilated.  4. The mitral valve is normal in structure. Trivial mitral valve regurgitation. No evidence of mitral stenosis.  5. The aortic valve is tricuspid. Aortic valve regurgitation is not visualized. Mild aortic valve sclerosis is present, with no evidence of aortic valve stenosis.  6. The inferior vena cava is normal in size with greater than 50% respiratory variability, suggesting right atrial pressure of 3 mmHg. FINDINGS  Left Ventricle: Left ventricular ejection fraction, by estimation, is 30 to 35%. The left ventricle has moderately decreased function. The left ventricle demonstrates regional wall motion abnormalities. The left ventricular internal cavity size was normal in size. There is no left ventricular hypertrophy. Left ventricular diastolic parameters are consistent with Grade I diastolic dysfunction (impaired relaxation). Right Ventricle: The right ventricular size is normal. No increase in right ventricular wall thickness. Right ventricular systolic function is normal. Tricuspid  regurgitation signal is inadequate for assessing PA pressure. Left Atrium: Left atrial size was mildly dilated. Right Atrium: Right atrial size was normal in size. Pericardium: There is no evidence of pericardial effusion. Mitral Valve: The mitral valve is normal in structure. Trivial mitral valve regurgitation. No evidence of mitral valve stenosis. Tricuspid Valve: The tricuspid valve is  normal in structure. Tricuspid valve regurgitation is not demonstrated. Aortic Valve: The aortic valve is tricuspid. Aortic valve regurgitation is not visualized. Mild aortic valve sclerosis is present, with no evidence of aortic valve stenosis. Pulmonic Valve: The pulmonic valve was normal in structure. Pulmonic valve regurgitation is not visualized. Aorta: The aortic root is normal in size and structure. Venous: The inferior vena cava is normal in size with greater than 50% respiratory variability, suggesting right atrial pressure of 3 mmHg. IAS/Shunts: No atrial level shunt detected by color flow Doppler.  LEFT VENTRICLE PLAX 2D LVIDd:         5.30 cm      Diastology LVIDs:         4.80 cm      LV e' medial:    8.38 cm/s LV PW:         1.10 cm      LV E/e' medial:  10.7 LV IVS:        0.90 cm      LV e' lateral:   10.90 cm/s LVOT diam:     2.10 cm      LV E/e' lateral: 8.2 LV SV:         71 LV SV Index:   36 LVOT Area:     3.46 cm  LV Volumes (MOD) LV vol d, MOD A2C: 129.0 ml LV vol d, MOD A4C: 125.0 ml LV vol s, MOD A2C: 70.0 ml LV vol s, MOD A4C: 77.2 ml LV SV MOD A2C:     59.0 ml LV SV MOD A4C:     125.0 ml LV SV MOD BP:      52.7 ml RIGHT VENTRICLE             IVC RV S prime:     14.30 cm/s  IVC diam: 1.80 cm TAPSE (M-mode): 2.3 cm LEFT ATRIUM             Index       RIGHT ATRIUM           Index LA diam:        4.30 cm 2.17 cm/m  RA Area:     14.00 cm LA Vol (A2C):   83.0 ml 41.84 ml/m RA Volume:   33.50 ml  16.89 ml/m LA Vol (A4C):   70.9 ml 35.74 ml/m LA Biplane Vol: 78.0 ml 39.32 ml/m  AORTIC VALVE LVOT Vmax:    103.00 cm/s LVOT Vmean:  68.700 cm/s LVOT VTI:    0.205 m  AORTA Ao Root diam: 3.10 cm Ao Asc diam:  3.10 cm MITRAL VALVE MV Area (PHT): 4.60 cm     SHUNTS MV Decel Time: 165 msec     Systemic VTI:  0.20 m MV E velocity: 89.60 cm/s   Systemic Diam: 2.10 cm MV A velocity: 118.00 cm/s MV E/A ratio:  0.76 Loralie Champagne MD Electronically signed by Loralie Champagne MD Signature Date/Time: 11/15/2020/5:43:17 PM    Final     Cardiac Studies   Cath images above, reviewed by me Echo images above, reviewed by me  Patient Profile     70 y.o. male with history of CAD, prior MI, DM, former tobacco abuse, sleep apnea, HTN, HLD, ischemic cardiomyopathy with recent chest pain concerning for unstable angina. Cardiac cath 11/15/20 with severe three vessel CAD. CT surgery consulted for CABG  Assessment & Plan    CAD with unstable angina: See cath above. Three vessel CAD. I agree that CABG is probably  the best option for complete revascularization. CT surgery has been consulted. Continue ASA, beta blocker, Imdur, Ranexa, statin. Prasugrel is being held pending bypass surgery.  2.   Ischemic cardiomyopathy: LVEF=30-35%.  Continue beta blocker and Ace-inh.  3.   HTN: BP is controlled.   For questions or updates, please contact [place] Please consult [email or web address] for contact info under        Signed, Lauree Chandler, MD  11/16/2020, 9:52 AM

## 2020-11-16 NOTE — Progress Notes (Signed)
ANTICOAGULATION CONSULT NOTE - Follow Up Consult  Pharmacy Consult for IV Heparin Indication: chest pain/ACS  Allergies  Allergen Reactions   Hydroxyzine Hcl Other (See Comments)    Weakness, out of this world feeling. sluggishness   Latex Rash    I.e. Elastic= rash to blisters if worn for an extended time Patient show effects after long exposure.    Patient Measurements: Height: 5\' 8"  (172.7 cm) Weight: 84.6 kg (186 lb 8 oz) IBW/kg (Calculated) : 68.4 Heparin Dosing Weight: 85.7 kg   Vital Signs: Temp: 98 F (36.7 C) (07/08 1635) Temp Source: Oral (07/08 1635) BP: 112/69 (07/08 1635) Pulse Rate: 79 (07/08 1635)  Labs: Recent Labs    11/16/20 0304 11/16/20 1030 11/16/20 1305 11/16/20 2013  HEPARINUNFRC 0.30 0.22* 0.23* 0.45  CREATININE 0.87  --   --   --      Estimated Creatinine Clearance: 84.9 mL/min (by C-G formula based on SCr of 0.87 mg/dL).   Medical History: Past Medical History:  Diagnosis Date   Angina    Asthma    BCC (basal cell carcinoma of skin) 09/06/2020   left upper back lateral (EDC) ,  left upper arm anterior (EDC), left upper back medial (EDC)    Bursitis of left elbow 2018   CAD (coronary artery disease) 04/12/2011   a. remote MI in 1996; b. MI 1997 s/p stenting to LAD & diag; c. MI 2009: long stenting from pLAD to mid LAD and diag & POBA of jailed diag branch; d. cath 2010: 80% ISR of LAD w/ L-R collats, med Rx; e. cath 2012: 50-60% tub mLAD, 60-70% dLAD, FFR 0.75 -->s/p PCI dissection repaired w/ 2 stents (3 total); f. cath 06/2012: no changes from 2012 cath, med Rx, patent stents    Diabetes mellitus without complication [facility])    ED (erectile dysfunction)    Heart disease    HTN (hypertension) 04/12/2011   Hyperlipemia 04/12/2011   Ischemic cardiomyopathy    a. echo 2010: EF 45-50%, mild to mod ant and apical wall HK, trace MR; b. cardiac cath 06/2012: EF 40%, mild MR    Myocardial infarction (HCC)    x 5   Obesity    OSA (obstructive  sleep apnea)    on CPAP   Osteoarthritis    Renal disorder    kidney stone    Assessment: 70 yr old man presented for cath; he was on no anticoagulants PTA (pt was on prasugrel PTA).   Cath finding: 3-vessel CAD; CABG planned when prasugrel washed out.   Heparin level came back subtherapeutic at 0.22, on 1350 units/hr. No s/sx of bleeding or infusion issues. Will order CBC for tomorrow.   Heparin level came back therapeutic tonight. We will cont with the same rate.  Goal of Therapy:  Heparin level 0.3-0.7 units/ml Monitor platelets by anticoagulation protocol: Yes   Plan:  Cont heparin 1500 units/hr  Monitor daily heparin level, CBC Monitor for bleeding  Onnie Boer, PharmD, BCIDP, AAHIVP, CPP Infectious Disease Pharmacist 11/16/2020 8:52 PM

## 2020-11-16 NOTE — Progress Notes (Signed)
ANTICOAGULATION CONSULT NOTE - Follow Up Consult  Pharmacy Consult for IV Heparin Indication: chest pain/ACS  Allergies  Allergen Reactions   Hydroxyzine Hcl Other (See Comments)    Weakness, out of this world feeling. sluggishness   Latex Rash    I.e. Elastic= rash to blisters if worn for an extended time Patient show effects after long exposure.    Patient Measurements: Height: 5\' 8"  (172.7 cm) Weight: 84.6 kg (186 lb 8 oz) IBW/kg (Calculated) : 68.4 Heparin Dosing Weight: 85.7 kg   Vital Signs: Temp: 97.9 F (36.6 C) (07/08 0534) Temp Source: Oral (07/08 0534) BP: 111/92 (07/08 1109) Pulse Rate: 76 (07/08 0534)  Labs: Recent Labs    11/15/20 1942 11/16/20 0304 11/16/20 1030  HEPARINUNFRC <0.10* 0.30 0.22*  CREATININE  --  0.87  --      Estimated Creatinine Clearance: 84.9 mL/min (by C-G formula based on SCr of 0.87 mg/dL).   Medical History: Past Medical History:  Diagnosis Date   Angina    Asthma    BCC (basal cell carcinoma of skin) 09/06/2020   left upper back lateral (EDC) ,  left upper arm anterior (EDC), left upper back medial (EDC)    Bursitis of left elbow 2018   CAD (coronary artery disease) 04/12/2011   a. remote MI in 1996; b. MI 1997 s/p stenting to LAD & diag; c. MI 2009: long stenting from pLAD to mid LAD and diag & POBA of jailed diag branch; d. cath 2010: 80% ISR of LAD w/ L-R collats, med Rx; e. cath 2012: 50-60% tub mLAD, 60-70% dLAD, FFR 0.75 -->s/p PCI dissection repaired w/ 2 stents (3 total); f. cath 06/2012: no changes from 2012 cath, med Rx, patent stents    Diabetes mellitus without complication [facility])    ED (erectile dysfunction)    Heart disease    HTN (hypertension) 04/12/2011   Hyperlipemia 04/12/2011   Ischemic cardiomyopathy    a. echo 2010: EF 45-50%, mild to mod ant and apical wall HK, trace MR; b. cardiac cath 06/2012: EF 40%, mild MR    Myocardial infarction (HCC)    x 5   Obesity    OSA (obstructive sleep apnea)    on  CPAP   Osteoarthritis    Renal disorder    kidney stone    Assessment: 70 yr old man presented for cath; he was on no anticoagulants PTA (pt was on prasugrel PTA).   Cath finding: 3-vessel CAD; CABG planned when prasugrel washed out.   Heparin level came back subtherapeutic at 0.22, on 1350 units/hr. No s/sx of bleeding or infusion issues. Will order CBC for tomorrow.   Goal of Therapy:  Heparin level 0.3-0.7 units/ml Monitor platelets by anticoagulation protocol: Yes   Plan:  Increase heparin to 1500 units/hr  Check 6 hr heparin level  Monitor daily heparin level, CBC Monitor for bleeding  Antonietta Jewel, PharmD, [place] Pharmacist  Phone: [phone] 11/16/2020 12:40 PM  Please check AMION for all [place] phone numbers After 10:00 PM, call [place] [phone]

## 2020-11-16 NOTE — Progress Notes (Signed)
ANTICOAGULATION CONSULT NOTE - Follow Up Consult  Pharmacy Consult for IV Heparin Indication: chest pain/ACS  Allergies  Allergen Reactions   Hydroxyzine Hcl Other (See Comments)    Weakness, out of this world feeling. sluggishness   Latex Rash    I.e. Elastic= rash to blisters if worn for an extended time Patient show effects after long exposure.    Patient Measurements: Height: 5\' 8"  (172.7 cm) Weight: 84.6 kg (186 lb 8 oz) IBW/kg (Calculated) : 68.4 Heparin Dosing Weight: 85.7 kg   Vital Signs: Temp: 96.6 F (35.9 C) (07/08 0217) Temp Source: Axillary (07/08 0217) BP: 107/66 (07/08 0217) Pulse Rate: 72 (07/08 0217)  Labs: Recent Labs    11/15/20 1942 11/16/20 0304  HEPARINUNFRC <0.10* 0.30     Estimated Creatinine Clearance: 71.7 mL/min (by C-G formula based on SCr of 1.03 mg/dL).   Medical History: Past Medical History:  Diagnosis Date   Angina    Asthma    BCC (basal cell carcinoma of skin) 09/06/2020   left upper back lateral (EDC) ,  left upper arm anterior (EDC), left upper back medial (EDC)    Bursitis of left elbow 2018   CAD (coronary artery disease) 04/12/2011   a. remote MI in 1996; b. MI 1997 s/p stenting to LAD & diag; c. MI 2009: long stenting from pLAD to mid LAD and diag & POBA of jailed diag branch; d. cath 2010: 80% ISR of LAD w/ L-R collats, med Rx; e. cath 2012: 50-60% tub mLAD, 60-70% dLAD, FFR 0.75 -->s/p PCI dissection repaired w/ 2 stents (3 total); f. cath 06/2012: no changes from 2012 cath, med Rx, patent stents    Diabetes mellitus without complication [facility])    ED (erectile dysfunction)    Heart disease    HTN (hypertension) 04/12/2011   Hyperlipemia 04/12/2011   Ischemic cardiomyopathy    a. echo 2010: EF 45-50%, mild to mod ant and apical wall HK, trace MR; b. cardiac cath 06/2012: EF 40%, mild MR    Myocardial infarction (HCC)    x 5   Obesity    OSA (obstructive sleep apnea)    on CPAP   Osteoarthritis    Renal disorder     kidney stone    Assessment: 70 yr old man presented for cath; he was on no anticoagulants PTA (pt was on prasugrel PTA).   Cath finding: 3-vessel CAD; CABG planned when prasugrel washed out. Pharmacy was consulted to start IV heparin infusion 2 hours after TR band removal.  Heparin level of 0.30 is therapeutic on heparin 1300 units/hr. No bleeding noted per RN.   Goal of Therapy:  Heparin level 0.3-0.7 units/ml Monitor platelets by anticoagulation protocol: Yes   Plan:  Increase heparin to 1350 units/hr to ensure remains in therapeutic range  Check 6 hr heparin level  Monitor daily heparin level, CBC Monitor for bleeding   Cristela Felt, PharmD Clinical Pharmacist  11/16/2020 3:53 AM

## 2020-11-17 LAB — BASIC METABOLIC PANEL
Anion gap: 9 (ref 5–15)
BUN: 19 mg/dL (ref 8–23)
CO2: 26 mmol/L (ref 22–32)
Calcium: 9.4 mg/dL (ref 8.9–10.3)
Chloride: 103 mmol/L (ref 98–111)
Creatinine, Ser: 1.12 mg/dL (ref 0.61–1.24)
GFR, Estimated: >60 mL/min (ref >60)
Glucose, Bld: 138 mg/dL — ABNORMAL HIGH (ref 70–99)
Potassium: 4 mmol/L (ref 3.5–5.1)
Sodium: 138 mmol/L (ref 135–145)

## 2020-11-17 LAB — CBC
HCT: 44 % (ref 39.0–52.0)
Hemoglobin: 15.3 g/dL (ref 13.0–17.0)
MCH: 32.9 pg (ref 26.0–34.0)
MCHC: 34.8 g/dL (ref 30.0–36.0)
MCV: 94.6 fL (ref 80.0–100.0)
Platelets: 151 10*3/uL (ref 150–400)
RBC: 4.65 MIL/uL (ref 4.22–5.81)
RDW: 12.1 % (ref 11.5–15.5)
WBC: 5.7 10*3/uL (ref 4.0–10.5)
nRBC: 0 % (ref 0.0–0.2)

## 2020-11-17 LAB — LIPID PANEL
Cholesterol: 194 mg/dL (ref 0–200)
HDL: 40 mg/dL — ABNORMAL LOW (ref >40)
LDL Cholesterol: 121 mg/dL — ABNORMAL HIGH (ref 0–99)
Total CHOL/HDL Ratio: 4.9 RATIO
Triglycerides: 163 mg/dL — ABNORMAL HIGH (ref <150)
VLDL: 33 mg/dL (ref 0–40)

## 2020-11-17 LAB — GLUCOSE, CAPILLARY
Glucose-Capillary: 153 mg/dL — ABNORMAL HIGH (ref 70–99)
Glucose-Capillary: 162 mg/dL — ABNORMAL HIGH (ref 70–99)
Glucose-Capillary: 131 mg/dL — ABNORMAL HIGH (ref 70–99)
Glucose-Capillary: 140 mg/dL — ABNORMAL HIGH (ref 70–99)

## 2020-11-17 LAB — HEPARIN LEVEL (UNFRACTIONATED): Heparin Unfractionated: 0.54 IU/mL (ref 0.30–0.70)

## 2020-11-17 NOTE — Progress Notes (Signed)
Patient has home CPAP unit for use and will self place when ready.

## 2020-11-17 NOTE — Progress Notes (Signed)
Progress Note  Patient Name: [name] Date of Encounter: 11/17/2020  [facility] Cardiologist: [name], MD    Subjective   70 year old gentleman who presents with chest pain.  Heart catheterization on July 7 revealed severe three-vessel coronary artery disease.  CT surgery has been consulted.  He has an ischemic cardiomyopathy with an EF of 30 to 35%.  He is on beta-blocker and ACE inhibitor at this time.  On heparin . No cp at present    Inpatient Medications    Scheduled Meds:  diphenhydrAMINE  50 mg Oral QHS   And   acetaminophen  1,000 mg Oral QHS   aspirin EC  81 mg Oral Daily   atorvastatin  80 mg Oral Daily   carvedilol  6.25 mg Oral BID WC   insulin aspart  0-15 Units Subcutaneous TID WC   insulin aspart  0-5 Units Subcutaneous QHS   isosorbide mononitrate  30 mg Oral Daily   ramipril  2.5 mg Oral q morning   ranolazine  1,000 mg Oral BID   sodium chloride flush  3 mL Intravenous Q12H   Continuous Infusions:  sodium chloride 250 mL (11/17/20 1045)   heparin 1,500 Units/hr (11/17/20 0337)   PRN Meds: sodium chloride, acetaminophen, nitroGLYCERIN, ondansetron (ZOFRAN) IV, sodium chloride flush   Vital Signs    Vitals:   11/16/20 1635 11/16/20 2116 11/17/20 0630 11/17/20 1050  BP: 112/69 110/70 127/71 130/79  Pulse: 79 77 81   Resp: 16  16   Temp: 98 F (36.7 C) 97.6 F (36.4 C) 98.1 F (36.7 C)   TempSrc: Oral Oral Oral   SpO2: 98% 96% 95%   Weight:      Height:        Intake/Output Summary (Last 24 hours) at 11/17/2020 1132 Last data filed at 11/17/2020 0823 Gross per 24 hour  Intake 420 ml  Output --  Net 420 ml   Last 3 Weights 11/15/2020 11/15/2020 11/06/2020  Weight (lbs) 186 lb 8 oz 190 lb 194 lb 6 oz  Weight (kg) 84.596 kg 86.183 kg 88.168 kg      Telemetry    NSR  - Personally Reviewed  ECG     - Personally Reviewed  Physical Exam   GEN: No acute distress.   Neck: No JVD Cardiac: RRR, no murmurs, rubs, or  gallops.  Respiratory: Clear to auscultation bilaterally. GI: Soft, nontender, non-distended  MS: No edema; No deformity.  Right radial pulse is normal , small bruise Neuro:  Nonfocal  Psych: Normal affect   Labs    High Sensitivity Troponin:  No results for input(s): TROPONINIHS in the last 720 hours.    Chemistry Recent Labs  Lab 11/16/20 0304 11/17/20 0112  NA 136 138  K 3.7 4.0  CL 106 103  CO2 23 26  GLUCOSE 134* 138*  BUN 15 19  CREATININE 0.87 1.12  CALCIUM 9.1 9.4  GFRNONAA >60 >60  ANIONGAP 7 9     Hematology Recent Labs  Lab 11/17/20 0112  WBC 5.7  RBC 4.65  HGB 15.3  HCT 44.0  MCV 94.6  MCH 32.9  MCHC 34.8  RDW 12.1  PLT 151    BNPNo results for input(s): BNP, PROBNP in the last 168 hours.   DDimer No results for input(s): DDIMER in the last 168 hours.   Radiology    ECHOCARDIOGRAM COMPLETE  Result Date: 11/15/2020    ECHOCARDIOGRAM REPORT   Patient Name:   [name]  [facility] Date of Exam: 11/15/2020 Medical Rec #:  [id number]            Height:       68.0 in Accession #:    [id number]           Weight:       186.5 lb Date of Birth:  Nov 27, 1950            BSA:          1.984 m Patient Age:    65 years             BP:           146/88 mmHg Patient Gender: M                    HR:           80 bpm. Exam Location:  Inpatient Procedure: 2D Echo Indications:    acute ischemic heart disease  History:        Patient has prior history of Echocardiogram examinations, most                 recent 05/25/2008. Cardiomyopathy, CAD, Signs/Symptoms:Chest                 Pain; Risk Factors:Hypertension, Dyslipidemia, Diabetes and                 Sleep Apnea.  Sonographer:    Johny Chess Referring Phys: [place]  1. Left ventricular ejection fraction, by estimation, is 30 to 35%. The left ventricle has moderately decreased function. The left ventricle demonstrates regional wall motion abnormalities with mid to apical anteroseptal and inferoseptal  akinesis. Akinesis of the apical anterior, apical lateral, and apical inferior walls as well as the true apex. Left ventricular diastolic parameters are consistent with Grade I diastolic dysfunction (impaired relaxation).  2. Right ventricular systolic function is normal. The right ventricular size is normal. Tricuspid regurgitation signal is inadequate for assessing PA pressure.  3. Left atrial size was mildly dilated.  4. The mitral valve is normal in structure. Trivial mitral valve regurgitation. No evidence of mitral stenosis.  5. The aortic valve is tricuspid. Aortic valve regurgitation is not visualized. Mild aortic valve sclerosis is present, with no evidence of aortic valve stenosis.  6. The inferior vena cava is normal in size with greater than 50% respiratory variability, suggesting right atrial pressure of 3 mmHg. FINDINGS  Left Ventricle: Left ventricular ejection fraction, by estimation, is 30 to 35%. The left ventricle has moderately decreased function. The left ventricle demonstrates regional wall motion abnormalities. The left ventricular internal cavity size was normal in size. There is no left ventricular hypertrophy. Left ventricular diastolic parameters are consistent with Grade I diastolic dysfunction (impaired relaxation). Right Ventricle: The right ventricular size is normal. No increase in right ventricular wall thickness. Right ventricular systolic function is normal. Tricuspid regurgitation signal is inadequate for assessing PA pressure. Left Atrium: Left atrial size was mildly dilated. Right Atrium: Right atrial size was normal in size. Pericardium: There is no evidence of pericardial effusion. Mitral Valve: The mitral valve is normal in structure. Trivial mitral valve regurgitation. No evidence of mitral valve stenosis. Tricuspid Valve: The tricuspid valve is normal in structure. Tricuspid valve regurgitation is not demonstrated. Aortic Valve: The aortic valve is tricuspid. Aortic valve  regurgitation is not visualized. Mild aortic valve sclerosis is present, with no evidence of aortic valve stenosis. Pulmonic Valve: The pulmonic valve was  normal in structure. Pulmonic valve regurgitation is not visualized. Aorta: The aortic root is normal in size and structure. Venous: The inferior vena cava is normal in size with greater than 50% respiratory variability, suggesting right atrial pressure of 3 mmHg. IAS/Shunts: No atrial level shunt detected by color flow Doppler.  LEFT VENTRICLE PLAX 2D LVIDd:         5.30 cm      Diastology LVIDs:         4.80 cm      LV e' medial:    8.38 cm/s LV PW:         1.10 cm      LV E/e' medial:  10.7 LV IVS:        0.90 cm      LV e' lateral:   10.90 cm/s LVOT diam:     2.10 cm      LV E/e' lateral: 8.2 LV SV:         71 LV SV Index:   36 LVOT Area:     3.46 cm  LV Volumes (MOD) LV vol d, MOD A2C: 129.0 ml LV vol d, MOD A4C: 125.0 ml LV vol s, MOD A2C: 70.0 ml LV vol s, MOD A4C: 77.2 ml LV SV MOD A2C:     59.0 ml LV SV MOD A4C:     125.0 ml LV SV MOD BP:      52.7 ml RIGHT VENTRICLE             IVC RV S prime:     14.30 cm/s  IVC diam: 1.80 cm TAPSE (M-mode): 2.3 cm LEFT ATRIUM             Index       RIGHT ATRIUM           Index LA diam:        4.30 cm 2.17 cm/m  RA Area:     14.00 cm LA Vol (A2C):   83.0 ml 41.84 ml/m RA Volume:   33.50 ml  16.89 ml/m LA Vol (A4C):   70.9 ml 35.74 ml/m LA Biplane Vol: 78.0 ml 39.32 ml/m  AORTIC VALVE LVOT Vmax:   103.00 cm/s LVOT Vmean:  68.700 cm/s LVOT VTI:    0.205 m  AORTA Ao Root diam: 3.10 cm Ao Asc diam:  3.10 cm MITRAL VALVE MV Area (PHT): 4.60 cm     SHUNTS MV Decel Time: 165 msec     Systemic VTI:  0.20 m MV E velocity: 89.60 cm/s   Systemic Diam: 2.10 cm MV A velocity: 118.00 cm/s MV E/A ratio:  0.76 Loralie Champagne MD Electronically signed by Loralie Champagne MD Signature Date/Time: 11/15/2020/5:43:17 PM    Final     Cardiac Studies     Patient Profile     70 y.o. male with 3 V CAD   Assessment & Plan     CAD  heart catheterization on July 7 revealed severe three-vessel coronary artery disease.  The plan is for coronary artery bypass grafting.  He is pain-free on heparin.  Continue carvedilol, imdur   2.  Hyperlipidemia: Continue atorvastatin 80 mg a day.    For questions or updates, please contact [place] Please consult [email or web address] for contact info under        Signed, Mertie Moores, MD  11/17/2020, 11:32 AM

## 2020-11-17 NOTE — Progress Notes (Signed)
CARDIAC REHAB PHASE I   PRE:  Rate/Rhythm: NSR/ 89  BP:  Sitting: 131/72      SaO2: 95  MODE:  Ambulation: 940 ft ST/105  POST:  Rate/Rhythm: ST/102  BP:  Sitting: 142/77    SaO2: 96  Reviewed patient in chair. IV Heparin in place. Pt is eager to walk. Pt ambulated with strong, steady gait pushing his IV pole. Pt voices "feels good to get up and walk and get out of the room."  Pt back to chair with call bell at side and IV in place. Pt pulled 2500 m/L on his I/S. Encouraged pt to be OOB to chair and use I/S. Pt encouraged to walk with staff again later today.    Lesly Rubenstein, MS, ACSM EP-C, CCRP 11/17/2020 8:45 - 9:11

## 2020-11-17 NOTE — Progress Notes (Addendum)
ANTICOAGULATION CONSULT NOTE - Follow Up Consult  Pharmacy Consult for IV Heparin Indication: Multi-vessel CAD  Allergies  Allergen Reactions   Hydroxyzine Hcl Other (See Comments)    Weakness, out of this world feeling. sluggishness   Latex Rash    I.e. Elastic= rash to blisters if worn for an extended time Patient show effects after long exposure.    Patient Measurements: Height: 5\' 8"  (172.7 cm) Weight: 84.6 kg (186 lb 8 oz) IBW/kg (Calculated) : 68.4 Heparin Dosing Weight: 85.7 kg   Vital Signs: Temp: 98.1 F (36.7 C) (07/09 0630) Temp Source: Oral (07/09 0630) BP: 127/71 (07/09 0630) Pulse Rate: 81 (07/09 0630)  Labs: Recent Labs    11/16/20 0304 11/16/20 1030 11/16/20 1305 11/16/20 2013 11/17/20 0112  HGB  --   --   --   --  15.3  HCT  --   --   --   --  44.0  PLT  --   --   --   --  151  HEPARINUNFRC 0.30   < > 0.23* 0.45 0.54  CREATININE 0.87  --   --   --  1.12   < > = values in this interval not displayed.     Estimated Creatinine Clearance: 65.9 mL/min (by C-G formula based on SCr of 1.12 mg/dL).   Medical History: Past Medical History:  Diagnosis Date   Angina    Asthma    BCC (basal cell carcinoma of skin) 09/06/2020   left upper back lateral (EDC) ,  left upper arm anterior (EDC), left upper back medial (EDC)    Bursitis of left elbow 2018   CAD (coronary artery disease) 04/12/2011   a. remote MI in 1996; b. MI 1997 s/p stenting to LAD & diag; c. MI 2009: long stenting from pLAD to mid LAD and diag & POBA of jailed diag branch; d. cath 2010: 80% ISR of LAD w/ L-R collats, med Rx; e. cath 2012: 50-60% tub mLAD, 60-70% dLAD, FFR 0.75 -->s/p PCI dissection repaired w/ 2 stents (3 total); f. cath 06/2012: no changes from 2012 cath, med Rx, patent stents    Diabetes mellitus without complication [facility])    ED (erectile dysfunction)    Heart disease    HTN (hypertension) 04/12/2011   Hyperlipemia 04/12/2011   Ischemic cardiomyopathy    a. echo 2010:  EF 45-50%, mild to mod ant and apical wall HK, trace MR; b. cardiac cath 06/2012: EF 40%, mild MR    Myocardial infarction (HCC)    x 5   Obesity    OSA (obstructive sleep apnea)    on CPAP   Osteoarthritis    Renal disorder    kidney stone    Assessment: 70 yr old man presented for cath; he was on no anticoagulants PTA (pt was on prasugrel PTA).   Cath finding: 3-vessel CAD; CABG planned when prasugrel washed out.   Heparin level came back therapeutic at 0.54, on 1500 units/hr. No s/sx of bleeding or infusion issues. CBC stable.  Goal of Therapy:  Heparin level 0.3-0.7 units/ml Monitor platelets by anticoagulation protocol: Yes   Plan:  Continue IV heparin @ 1500 units/hr  Monitor daily heparin level, CBC Monitor for bleeding  Thank you for involving pharmacy in this patient's care.  Elita Quick, PharmD PGY1 Ambulatory Care Pharmacy Resident 11/17/2020 10:25 AM  **Pharmacist phone directory can be found on [email or web address] listed under [place]**

## 2020-11-18 DIAGNOSIS — I2510 Atherosclerotic heart disease of native coronary artery without angina pectoris: Secondary | ICD-10-CM

## 2020-11-18 DIAGNOSIS — I5043 Acute on chronic combined systolic (congestive) and diastolic (congestive) heart failure: Secondary | ICD-10-CM

## 2020-11-18 LAB — GLUCOSE, CAPILLARY
Glucose-Capillary: 189 mg/dL — ABNORMAL HIGH (ref 70–99)
Glucose-Capillary: 122 mg/dL — ABNORMAL HIGH (ref 70–99)
Glucose-Capillary: 143 mg/dL — ABNORMAL HIGH (ref 70–99)
Glucose-Capillary: 167 mg/dL — ABNORMAL HIGH (ref 70–99)

## 2020-11-18 LAB — CBC
HCT: 44.2 % (ref 39.0–52.0)
Hemoglobin: 15.5 g/dL (ref 13.0–17.0)
MCH: 33.3 pg (ref 26.0–34.0)
MCHC: 35.1 g/dL (ref 30.0–36.0)
MCV: 94.8 fL (ref 80.0–100.0)
Platelets: 152 10*3/uL (ref 150–400)
RBC: 4.66 MIL/uL (ref 4.22–5.81)
RDW: 12.3 % (ref 11.5–15.5)
WBC: 6.2 10*3/uL (ref 4.0–10.5)
nRBC: 0 % (ref 0.0–0.2)

## 2020-11-18 LAB — HEPARIN LEVEL (UNFRACTIONATED)
Heparin Unfractionated: 0.63 IU/mL (ref 0.30–0.70)
Heparin Unfractionated: 0.21 IU/mL — ABNORMAL LOW (ref 0.30–0.70)

## 2020-11-18 MED ORDER — LOSARTAN POTASSIUM 25 MG PO TABS
25.0000 mg | ORAL_TABLET | Freq: Every day | ORAL | Status: DC
  Administered 2020-11-18 – 2020-11-19 (×2): 25 mg via ORAL
  Filled 2020-11-18 (×2): qty 1

## 2020-11-18 MED ORDER — HEPARIN (PORCINE) 25000 UT/250ML-% IV SOLN
1400.0000 [IU]/h | INTRAVENOUS | Status: DC
  Administered 2020-11-18: 1350 [IU]/h via INTRAVENOUS
  Administered 2020-11-19: 1400 [IU]/h via INTRAVENOUS
  Filled 2020-11-18 (×2): qty 250

## 2020-11-18 MED ORDER — LOSARTAN POTASSIUM 25 MG PO TABS: 25.0000 mg | ORAL_TABLET | Freq: Every day | ORAL | Status: DC

## 2020-11-18 NOTE — Progress Notes (Addendum)
Progress Note  Patient Name: [name] Date of Encounter: 11/18/2020  [facility] Cardiologist: [name], MD    Subjective   70 year old gentleman who presents with chest pain.  Heart catheterization on July 7 revealed severe three-vessel coronary artery disease.  CT surgery has been consulted.  He has an ischemic cardiomyopathy with an EF of 30 to 35%.  He is on beta-blocker and ACE inhibitor at this time.  Currently pain free    Inpatient Medications    Scheduled Meds:  diphenhydrAMINE  50 mg Oral QHS   And   acetaminophen  1,000 mg Oral QHS   aspirin EC  81 mg Oral Daily   atorvastatin  80 mg Oral Daily   carvedilol  6.25 mg Oral BID WC   insulin aspart  0-15 Units Subcutaneous TID WC   insulin aspart  0-5 Units Subcutaneous QHS   isosorbide mononitrate  30 mg Oral Daily   ramipril  2.5 mg Oral q morning   ranolazine  1,000 mg Oral BID   sodium chloride flush  3 mL Intravenous Q12H   Continuous Infusions:  sodium chloride 250 mL (11/17/20 1045)   heparin 1,500 Units/hr (11/17/20 2139)   PRN Meds: sodium chloride, acetaminophen, nitroGLYCERIN, ondansetron (ZOFRAN) IV, sodium chloride flush   Vital Signs    Vitals:   11/17/20 0630 11/17/20 1050 11/17/20 1419 11/18/20 0550  BP: 127/71 130/79 106/63 127/72  Pulse: 81  82 80  Resp: 16  18 17   Temp: 98.1 F (36.7 C)  98.1 F (36.7 C) 98.1 F (36.7 C)  TempSrc: Oral  Oral Oral  SpO2: 95%  97% 97%  Weight:      Height:        Intake/Output Summary (Last 24 hours) at 11/18/2020 1027 Last data filed at 11/18/2020 0800 Gross per 24 hour  Intake 1698.27 ml  Output --  Net 1698.27 ml    Last 3 Weights 11/15/2020 11/15/2020 11/06/2020  Weight (lbs) 186 lb 8 oz 190 lb 194 lb 6 oz  Weight (kg) 84.596 kg 86.183 kg 88.168 kg      Telemetry    NSR - Personally Reviewed  ECG     - Personally Reviewed  Physical Exam   Physical Exam: Blood pressure 127/72, pulse 80, temperature 98.1 F (36.7  C), temperature source Oral, resp. rate 17, height 5\' 8"  (1.727 m), weight 84.6 kg, SpO2 97 %.  GEN:  Well nourished, well developed in no acute distress HEENT: Normal NECK: No JVD; No carotid bruits LYMPHATICS: No lymphadenopathy CARDIAC: RRR , no murmurs, rubs, gallops RESPIRATORY:  Clear to auscultation without rales, wheezing or rhonchi  ABDOMEN: Soft, non-tender, non-distended MUSCULOSKELETAL:  R radial cath site is oozing sightly .   Looks like a venous ooze.   Pulse is good.  Ulnar pulse is normal.   Janett Billow, RN will apply a pressure dressing to help stop this venous ooze.  SKIN: Warm and dry NEUROLOGIC:  Alert and oriented x 3   Labs    High Sensitivity Troponin:  No results for input(s): TROPONINIHS in the last 720 hours.    Chemistry Recent Labs  Lab 11/16/20 0304 11/17/20 0112  NA 136 138  K 3.7 4.0  CL 106 103  CO2 23 26  GLUCOSE 134* 138*  BUN 15 19  CREATININE 0.87 1.12  CALCIUM 9.1 9.4  GFRNONAA >60 >60  ANIONGAP 7 9      Hematology Recent Labs  Lab 11/17/20 0112 11/18/20 0141  WBC 5.7 6.2  RBC 4.65 4.66  HGB 15.3 15.5  HCT 44.0 44.2  MCV 94.6 94.8  MCH 32.9 33.3  MCHC 34.8 35.1  RDW 12.1 12.3  PLT 151 152     BNPNo results for input(s): BNP, PROBNP in the last 168 hours.   DDimer No results for input(s): DDIMER in the last 168 hours.   Radiology    No results found.  Cardiac Studies     Patient Profile     70 y.o. male with 3 V CAD   Assessment & Plan     CAD heart catheterization on July 7 revealed severe three-vessel coronary artery disease.   He is tentatively scheduled to have coronary artery bypass grafting this week.  No angina at present    2.  Hyperlipidemia: Continue atorvastatin 80 mg a day.    3.  Acute on chronic combined systolic and diastolic congestive heart failure.  Continue beta-blocker.  I will DC Ramipril and start Losartan - as the initial step toward transitioning him to [facility]   4.  R radial cath  site bleeding :   was slightly bruised yesterday .  Is oozing more today .  Venous ooze, not arterial . Will apply a pressure dressing and follow    For questions or updates, please contact [place] Please consult [email or web address] for contact info under        Signed, Mertie Moores, MD  11/18/2020, 10:27 AM

## 2020-11-18 NOTE — Progress Notes (Signed)
ANTICOAGULATION CONSULT NOTE - Follow Up Consult  Pharmacy Consult for IV Heparin Indication: Multi-vessel CAD  Allergies  Allergen Reactions   Hydroxyzine Hcl Other (See Comments)    Weakness, out of this world feeling. sluggishness   Latex Rash    I.e. Elastic= rash to blisters if worn for an extended time Patient show effects after long exposure.    Patient Measurements: Height: 5\' 8"  (172.7 cm) Weight: 84.6 kg (186 lb 8 oz) IBW/kg (Calculated) : 68.4 Heparin Dosing Weight: 85.7 kg   Vital Signs: Temp: 98.1 F (36.7 C) (07/10 2023) Temp Source: Oral (07/10 2023) BP: 128/80 (07/10 2023) Pulse Rate: 78 (07/10 2023)  Labs: Recent Labs    11/16/20 0304 11/16/20 1030 11/17/20 0112 11/18/20 0141 11/18/20 1942  HGB  --   --  15.3 15.5  --   HCT  --   --  44.0 44.2  --   PLT  --   --  151 152  --   HEPARINUNFRC 0.30   < > 0.54 0.63 0.21*  CREATININE 0.87  --  1.12  --   --    < > = values in this interval not displayed.     Estimated Creatinine Clearance: 65.9 mL/min (by C-G formula based on SCr of 1.12 mg/dL).   Medical History: Past Medical History:  Diagnosis Date   Angina    Asthma    BCC (basal cell carcinoma of skin) 09/06/2020   left upper back lateral (EDC) ,  left upper arm anterior (EDC), left upper back medial (EDC)    Bursitis of left elbow 2018   CAD (coronary artery disease) 04/12/2011   a. remote MI in 1996; b. MI 1997 s/p stenting to LAD & diag; c. MI 2009: long stenting from pLAD to mid LAD and diag & POBA of jailed diag branch; d. cath 2010: 80% ISR of LAD w/ L-R collats, med Rx; e. cath 2012: 50-60% tub mLAD, 60-70% dLAD, FFR 0.75 -->s/p PCI dissection repaired w/ 2 stents (3 total); f. cath 06/2012: no changes from 2012 cath, med Rx, patent stents    Diabetes mellitus without complication [facility])    ED (erectile dysfunction)    Heart disease    HTN (hypertension) 04/12/2011   Hyperlipemia 04/12/2011   Ischemic cardiomyopathy    a. echo 2010: EF  45-50%, mild to mod ant and apical wall HK, trace MR; b. cardiac cath 06/2012: EF 40%, mild MR    Myocardial infarction (HCC)    x 5   Obesity    OSA (obstructive sleep apnea)    on CPAP   Osteoarthritis    Renal disorder    kidney stone    Assessment: 70 yr old man presented for cath; he was on no anticoagulants PTA (pt was on prasugrel PTA). Cath finding: 3-vessel CAD; CABG planned when prasugrel washed out.   Heparin level 0.21 is sub therapeutic on 1350 units/h. Hepainr was held for 3 hours and level was drawn 6.5 hrs after restart so not at steady state.  No further bleeding per RN.  Goal of Therapy:  Heparin level 0.3-0.5 units/ml (for oozing R radial cath site 7/10) Monitor platelets by anticoagulation protocol: Yes   Plan:  Increase IV heparin gtt to 1400 units/hr   Daily heparin level, CBC Monitor for s/sx of bleeding   Thank you for involving pharmacy in this patient's care.   Benetta Spar, PharmD, BCPS, BCCP Clinical Pharmacist  Please check AMION for all [facility] Pharmacy phone numbers  After 10:00 PM, call [place] [phone]

## 2020-11-18 NOTE — Progress Notes (Signed)
ANTICOAGULATION CONSULT NOTE - Follow Up Consult  Pharmacy Consult for IV Heparin Indication: Multi-vessel CAD  Allergies  Allergen Reactions   Hydroxyzine Hcl Other (See Comments)    Weakness, out of this world feeling. sluggishness   Latex Rash    I.e. Elastic= rash to blisters if worn for an extended time Patient show effects after long exposure.    Patient Measurements: Height: 5\' 8"  (172.7 cm) Weight: 84.6 kg (186 lb 8 oz) IBW/kg (Calculated) : 68.4 Heparin Dosing Weight: 85.7 kg   Vital Signs: Temp: 98.1 F (36.7 C) (07/10 0550) Temp Source: Oral (07/10 0550) BP: 127/72 (07/10 0550) Pulse Rate: 80 (07/10 0550)  Labs: Recent Labs    11/16/20 0304 11/16/20 1030 11/16/20 2013 11/17/20 0112 11/18/20 0141  HGB  --   --   --  15.3 15.5  HCT  --   --   --  44.0 44.2  PLT  --   --   --  151 152  HEPARINUNFRC 0.30   < > 0.45 0.54 0.63  CREATININE 0.87  --   --  1.12  --    < > = values in this interval not displayed.     Estimated Creatinine Clearance: 65.9 mL/min (by C-G formula based on SCr of 1.12 mg/dL).   Medical History: Past Medical History:  Diagnosis Date   Angina    Asthma    BCC (basal cell carcinoma of skin) 09/06/2020   left upper back lateral (EDC) ,  left upper arm anterior (EDC), left upper back medial (EDC)    Bursitis of left elbow 2018   CAD (coronary artery disease) 04/12/2011   a. remote MI in 1996; b. MI 1997 s/p stenting to LAD & diag; c. MI 2009: long stenting from pLAD to mid LAD and diag & POBA of jailed diag branch; d. cath 2010: 80% ISR of LAD w/ L-R collats, med Rx; e. cath 2012: 50-60% tub mLAD, 60-70% dLAD, FFR 0.75 -->s/p PCI dissection repaired w/ 2 stents (3 total); f. cath 06/2012: no changes from 2012 cath, med Rx, patent stents    Diabetes mellitus without complication [facility])    ED (erectile dysfunction)    Heart disease    HTN (hypertension) 04/12/2011   Hyperlipemia 04/12/2011   Ischemic cardiomyopathy    a. echo 2010: EF  45-50%, mild to mod ant and apical wall HK, trace MR; b. cardiac cath 06/2012: EF 40%, mild MR    Myocardial infarction (HCC)    x 5   Obesity    OSA (obstructive sleep apnea)    on CPAP   Osteoarthritis    Renal disorder    kidney stone    Assessment: 70 yr old man presented for cath; he was on no anticoagulants PTA (pt was on prasugrel PTA). Cath finding: 3-vessel CAD; CABG planned when prasugrel washed out.   Heparin level 0.61 is therapeutic @ 1500 units/h. However, pt IV site noted to be bleeding and oozing today, per Dr. Elmarie Shiley note. Heparin gtt stopped for a total of 3 h. Due to oozing/bleed, will target a lower heparin level goal of 0.3-0.5 when restarting gtt. Hgb/HCT and PLT stable today.   Goal of Therapy:  Heparin level 0.3-0.5 units/ml Monitor platelets by anticoagulation protocol: Yes   Plan:  Restart IV heparin gtt @ 1350 units/hr (decreased rate) Check 6h heparin level  Daily heparin level, CBC Monitor for s/sx of bleeding   Thank you for involving pharmacy in this patient's care.  Olin Hauser  Evelena Peat, PharmD PGY1 Ambulatory Care Pharmacy Resident 11/18/2020 11:24 AM  **Pharmacist phone directory can be found on [email or web address] listed under [place]**

## 2020-11-18 NOTE — Progress Notes (Signed)
Patient right radial cath site oozing.  Dr. Acie Fredrickson came to the room to assess. Stated to apply gauze pressure dressing and stop IV heparin x 3 hours.

## 2020-11-18 NOTE — Progress Notes (Signed)
Pt has home unit and places self on/off as needed. RT will monitor. 

## 2020-11-19 ENCOUNTER — Encounter (HOSPITAL_COMMUNITY): Payer: Self-pay | Admitting: Internal Medicine

## 2020-11-19 ENCOUNTER — Other Ambulatory Visit (HOSPITAL_COMMUNITY): Payer: Self-pay

## 2020-11-19 ENCOUNTER — Inpatient Hospital Stay (HOSPITAL_COMMUNITY): Payer: Medicare Other

## 2020-11-19 DIAGNOSIS — I255 Ischemic cardiomyopathy: Secondary | ICD-10-CM

## 2020-11-19 DIAGNOSIS — I10 Essential (primary) hypertension: Secondary | ICD-10-CM

## 2020-11-19 DIAGNOSIS — E1169 Type 2 diabetes mellitus with other specified complication: Secondary | ICD-10-CM

## 2020-11-19 DIAGNOSIS — E785 Hyperlipidemia, unspecified: Secondary | ICD-10-CM

## 2020-11-19 DIAGNOSIS — Z01810 Encounter for preprocedural cardiovascular examination: Secondary | ICD-10-CM

## 2020-11-19 DIAGNOSIS — E1159 Type 2 diabetes mellitus with other circulatory complications: Secondary | ICD-10-CM

## 2020-11-19 LAB — URINALYSIS, ROUTINE W REFLEX MICROSCOPIC
Bilirubin Urine: NEGATIVE
Glucose, UA: >=500 mg/dL — AB
Hgb urine dipstick: NEGATIVE
Ketones, ur: 5 mg/dL — AB
Nitrite: NEGATIVE
Protein, ur: NEGATIVE mg/dL
Specific Gravity, Urine: 1.013 (ref 1.005–1.030)
pH: 7 (ref 5.0–8.0)

## 2020-11-19 LAB — BASIC METABOLIC PANEL
Anion gap: 9 (ref 5–15)
BUN: 22 mg/dL (ref 8–23)
CO2: 22 mmol/L (ref 22–32)
Calcium: 10 mg/dL (ref 8.9–10.3)
Chloride: 104 mmol/L (ref 98–111)
Creatinine, Ser: 0.91 mg/dL (ref 0.61–1.24)
GFR, Estimated: >60 mL/min (ref >60)
Glucose, Bld: 112 mg/dL — ABNORMAL HIGH (ref 70–99)
Potassium: 4.1 mmol/L (ref 3.5–5.1)
Sodium: 135 mmol/L (ref 135–145)

## 2020-11-19 LAB — GLUCOSE, CAPILLARY
Glucose-Capillary: 262 mg/dL — ABNORMAL HIGH (ref 70–99)
Glucose-Capillary: 281 mg/dL — ABNORMAL HIGH (ref 70–99)
Glucose-Capillary: 123 mg/dL — ABNORMAL HIGH (ref 70–99)
Glucose-Capillary: 146 mg/dL — ABNORMAL HIGH (ref 70–99)
Glucose-Capillary: 186 mg/dL — ABNORMAL HIGH (ref 70–99)

## 2020-11-19 LAB — TYPE AND SCREEN
ABO/RH(D): O POS
Antibody Screen: NEGATIVE

## 2020-11-19 LAB — CBC
HCT: 43.1 % (ref 39.0–52.0)
Hemoglobin: 15 g/dL (ref 13.0–17.0)
MCH: 32.5 pg (ref 26.0–34.0)
MCHC: 34.8 g/dL (ref 30.0–36.0)
MCV: 93.5 fL (ref 80.0–100.0)
Platelets: 156 10*3/uL (ref 150–400)
RBC: 4.61 MIL/uL (ref 4.22–5.81)
RDW: 12.4 % (ref 11.5–15.5)
WBC: 6.1 10*3/uL (ref 4.0–10.5)
nRBC: 0 % (ref 0.0–0.2)

## 2020-11-19 LAB — ABO/RH: ABO/RH(D): O POS

## 2020-11-19 LAB — SURGICAL PCR SCREEN
MRSA, PCR: NEGATIVE
Staphylococcus aureus: NEGATIVE

## 2020-11-19 LAB — HEPARIN LEVEL (UNFRACTIONATED): Heparin Unfractionated: 0.47 IU/mL (ref 0.30–0.70)

## 2020-11-19 MED ORDER — CHLORHEXIDINE GLUCONATE CLOTH 2 % EX PADS
6.0000 | MEDICATED_PAD | Freq: Once | CUTANEOUS | Status: AC
  Administered 2020-11-20: 6 via TOPICAL

## 2020-11-19 MED ORDER — TRANEXAMIC ACID (OHS) BOLUS VIA INFUSION
15.0000 mg/kg | INTRAVENOUS | Status: AC
  Administered 2020-11-20: 1269 mg via INTRAVENOUS
  Filled 2020-11-19: qty 1269

## 2020-11-19 MED ORDER — NOREPINEPHRINE 4 MG/250ML-% IV SOLN
0.0000 ug/min | INTRAVENOUS | Status: DC
  Filled 2020-11-19: qty 250

## 2020-11-19 MED ORDER — CHLORHEXIDINE GLUCONATE CLOTH 2 % EX PADS
6.0000 | MEDICATED_PAD | Freq: Once | CUTANEOUS | Status: AC
  Administered 2020-11-19: 6 via TOPICAL

## 2020-11-19 MED ORDER — MAGNESIUM SULFATE 50 % IJ SOLN
40.0000 meq | INTRAMUSCULAR | Status: DC
  Filled 2020-11-19: qty 9.85

## 2020-11-19 MED ORDER — SODIUM CHLORIDE 0.9 % IV SOLN
INTRAVENOUS | Status: DC
  Filled 2020-11-19: qty 30

## 2020-11-19 MED ORDER — TEMAZEPAM 15 MG PO CAPS: 15.0000 mg | ORAL_CAPSULE | Freq: Once | ORAL | Status: DC | PRN

## 2020-11-19 MED ORDER — METOPROLOL TARTRATE 12.5 MG HALF TABLET
12.5000 mg | ORAL_TABLET | Freq: Once | ORAL | Status: AC
  Administered 2020-11-20: 12.5 mg via ORAL
  Filled 2020-11-19: qty 1

## 2020-11-19 MED ORDER — NITROGLYCERIN IN D5W 200-5 MCG/ML-% IV SOLN
2.0000 ug/min | INTRAVENOUS | Status: DC
  Filled 2020-11-19: qty 250

## 2020-11-19 MED ORDER — BISACODYL 5 MG PO TBEC
5.0000 mg | DELAYED_RELEASE_TABLET | Freq: Once | ORAL | Status: AC
  Administered 2020-11-19: 5 mg via ORAL
  Filled 2020-11-19: qty 1

## 2020-11-19 MED ORDER — MILRINONE LACTATE IN DEXTROSE 20-5 MG/100ML-% IV SOLN
0.3000 ug/kg/min | INTRAVENOUS | Status: DC
  Filled 2020-11-19: qty 100

## 2020-11-19 MED ORDER — MUPIROCIN 2 % EX OINT
1.0000 "application " | TOPICAL_OINTMENT | Freq: Two times a day (BID) | CUTANEOUS | Status: DC
  Filled 2020-11-19: qty 22

## 2020-11-19 MED ORDER — CEFAZOLIN SODIUM-DEXTROSE 2-4 GM/100ML-% IV SOLN
2.0000 g | INTRAVENOUS | Status: AC
  Administered 2020-11-20: 2 g via INTRAVENOUS
  Filled 2020-11-19: qty 100

## 2020-11-19 MED ORDER — EPINEPHRINE HCL 5 MG/250ML IV SOLN IN NS
0.0000 ug/min | INTRAVENOUS | Status: DC
Start: 2020-11-20 — End: 2020-11-20
  Filled 2020-11-19: qty 250

## 2020-11-19 MED ORDER — PHENYLEPHRINE HCL-NACL 20-0.9 MG/250ML-% IV SOLN
30.0000 ug/min | INTRAVENOUS | Status: DC
  Filled 2020-11-19: qty 250

## 2020-11-19 MED ORDER — DEXMEDETOMIDINE HCL IN NACL 400 MCG/100ML IV SOLN
0.1000 ug/kg/h | INTRAVENOUS | Status: DC
  Filled 2020-11-19: qty 100

## 2020-11-19 MED ORDER — TRANEXAMIC ACID (OHS) PUMP PRIME SOLUTION
2.0000 mg/kg | INTRAVENOUS | Status: DC
  Filled 2020-11-19: qty 1.69

## 2020-11-19 MED ORDER — VANCOMYCIN HCL 1500 MG/300ML IV SOLN
1500.0000 mg | INTRAVENOUS | Status: AC
  Administered 2020-11-20: 1500 mg via INTRAVENOUS
  Filled 2020-11-19: qty 300

## 2020-11-19 MED ORDER — PLASMA-LYTE A IV SOLN
INTRAVENOUS | Status: DC
  Filled 2020-11-19: qty 5

## 2020-11-19 MED ORDER — CHLORHEXIDINE GLUCONATE 0.12 % MT SOLN
15.0000 mL | Freq: Once | OROMUCOSAL | Status: AC
Start: 2020-11-20 — End: 2020-11-20
  Administered 2020-11-20: 15 mL via OROMUCOSAL
  Filled 2020-11-19: qty 15

## 2020-11-19 MED ORDER — INSULIN REGULAR(HUMAN) IN NACL 100-0.9 UT/100ML-% IV SOLN
INTRAVENOUS | Status: DC
  Filled 2020-11-19: qty 100

## 2020-11-19 MED ORDER — TRANEXAMIC ACID 1000 MG/10ML IV SOLN
1.5000 mg/kg/h | INTRAVENOUS | Status: DC
  Filled 2020-11-19: qty 25

## 2020-11-19 MED ORDER — POTASSIUM CHLORIDE 2 MEQ/ML IV SOLN
80.0000 meq | INTRAVENOUS | Status: DC
  Filled 2020-11-19: qty 40

## 2020-11-19 NOTE — Progress Notes (Addendum)
Progress Note  Patient Name: [name] Date of Encounter: 11/19/2020  [facility] Cardiologist: [name], MD   Subjective   No complaints this morning.   Inpatient Medications    Scheduled Meds:  diphenhydrAMINE  50 mg Oral QHS   And   acetaminophen  1,000 mg Oral QHS   aspirin EC  81 mg Oral Daily   atorvastatin  80 mg Oral Daily   carvedilol  6.25 mg Oral BID WC   [START ON 11/20/2020] chlorhexidine  15 mL Mouth/Throat Once   Chlorhexidine Gluconate Cloth  6 each Topical Once   And   Chlorhexidine Gluconate Cloth  6 each Topical Once   [START ON 11/20/2020] epinephrine  0-10 mcg/min Intravenous To OR   [START ON 11/20/2020] heparin-papaverine-plasmalyte irrigation   Irrigation To OR   insulin aspart  0-15 Units Subcutaneous TID WC   insulin aspart  0-5 Units Subcutaneous QHS   [START ON 11/20/2020] insulin   Intravenous To OR   isosorbide mononitrate  30 mg Oral Daily   losartan  25 mg Oral Daily   [START ON 11/20/2020] magnesium sulfate  40 mEq Other To OR   [START ON 11/20/2020] metoprolol tartrate  12.5 mg Oral Once   mupirocin ointment  1 application Nasal BID   [START ON 11/20/2020] phenylephrine  30-200 mcg/min Intravenous To OR   [START ON 11/20/2020] potassium chloride  80 mEq Other To OR   ranolazine  1,000 mg Oral BID   sodium chloride flush  3 mL Intravenous Q12H   [START ON 11/20/2020] tranexamic acid  15 mg/kg Intravenous To OR   [START ON 11/20/2020] tranexamic acid  2 mg/kg Intracatheter To OR   Continuous Infusions:  sodium chloride 10 mL/hr at 11/19/20 0353   [START ON 11/20/2020]  ceFAZolin (ANCEF) IV     [START ON 11/20/2020]  ceFAZolin (ANCEF) IV     [START ON 11/20/2020] dexmedetomidine     [START ON 11/20/2020] heparin 30,000 units/NS 1000 mL solution for CELLSAVER     heparin 1,400 Units/hr (11/19/20 0353)   [START ON 11/20/2020] milrinone     [START ON 11/20/2020] nitroGLYCERIN     [START ON 11/20/2020] norepinephrine     [START ON  11/20/2020] tranexamic acid (CYKLOKAPRON) infusion ([facility])     [START ON 11/20/2020] vancomycin     PRN Meds: sodium chloride, acetaminophen, nitroGLYCERIN, ondansetron (ZOFRAN) IV, sodium chloride flush, temazepam   Vital Signs    Vitals:   11/18/20 0550 11/18/20 1410 11/18/20 2023 11/19/20 0556  BP: 127/72 97/65 128/80 134/77  Pulse: 80 87 78 79  Resp: 17 18 16 18   Temp: 98.1 F (36.7 C)  98.1 F (36.7 C) 98.1 F (36.7 C)  TempSrc: Oral  Oral Oral  SpO2: 97% 96% 96% 96%  Weight:      Height:        Intake/Output Summary (Last 24 hours) at 11/19/2020 1026 Last data filed at 11/19/2020 0353 Gross per 24 hour  Intake 449.38 ml  Output --  Net 449.38 ml   Last 3 Weights 11/15/2020 11/15/2020 11/06/2020  Weight (lbs) 186 lb 8 oz 190 lb 194 lb 6 oz  Weight (kg) 84.596 kg 86.183 kg 88.168 kg      Telemetry    SR - Personally Reviewed  ECG   No new tracing  Physical Exam   GEN: No acute distress.   Neck: No JVD Cardiac: RRR, no murmurs, rubs, or gallops.  Respiratory: Clear to auscultation bilaterally. GI: Soft,  nontender, non-distended  MS: No edema; No deformity. Neuro:  Nonfocal  Psych: Normal affect   Labs    High Sensitivity Troponin:  No results for input(s): TROPONINIHS in the last 720 hours.    Chemistry Recent Labs  Lab 11/16/20 0304 11/17/20 0112  NA 136 138  K 3.7 4.0  CL 106 103  CO2 23 26  GLUCOSE 134* 138*  BUN 15 19  CREATININE 0.87 1.12  CALCIUM 9.1 9.4  GFRNONAA >60 >60  ANIONGAP 7 9     Hematology Recent Labs  Lab 11/17/20 0112 11/18/20 0141 11/19/20 0232  WBC 5.7 6.2 6.1  RBC 4.65 4.66 4.61  HGB 15.3 15.5 15.0  HCT 44.0 44.2 43.1  MCV 94.6 94.8 93.5  MCH 32.9 33.3 32.5  MCHC 34.8 35.1 34.8  RDW 12.1 12.3 12.4  PLT 151 152 156    BNPNo results for input(s): BNP, PROBNP in the last 168 hours.   DDimer No results for input(s): DDIMER in the last 168 hours.   Radiology    CT CHEST WO CONTRAST  Result Date:  11/19/2020 CLINICAL DATA:  Preop planning for CABG. EXAM: CT CHEST WITHOUT CONTRAST TECHNIQUE: Multidetector CT imaging of the chest was performed following the standard protocol without IV contrast. COMPARISON:  None. FINDINGS: Cardiovascular: Ascending thoracic aorta measures 32 mm. Descending thoracic aorta measures 28 mm. Bovine arch anatomy. Dense three-vessel coronary artery calcification. Mediastinum/Nodes: No axillary or supraclavicular adenopathy. No mediastinal or hilar adenopathy. No pericardial fluid. Esophagus normal. Lungs/Pleura: No suspicious pulmonary nodules. Normal pleural. Airways normal. Upper Abdomen: Low-density enlargement of the LEFT adrenal gland to 18 mm consistent benign adrenal adenoma. Musculoskeletal: No acute osseous abnormality. IMPRESSION: 1. Three-vessel coronary artery calcification. 2.  Aortic Atherosclerosis (ICD10-I70.0). Electronically Signed   By: Suzy Bouchard M.D.   On: 11/19/2020 09:44   VAS US DOPPLER PRE CABG  Result Date: 11/19/2020 PREOPERATIVE VASCULAR EVALUATION Patient Name:  KLAUS CASTENEDA [facility]  Date of Exam:   11/19/2020 Medical Rec #: [id number]             Accession #:    [id number] Date of Birth: 1950-07-31             Patient Gender: M Patient Age:   70Y Exam Location:  [facility] Procedure:      VAS US DOPPLER PRE CABG Referring Phys: [id number] [place] --------------------------------------------------------------------------------  Indications:      Pre-CABG. Risk Factors:     Hypertension, hyperlipidemia, Diabetes, past history of                   smoking, coronary artery disease. Other Factors:    History of multiple PCIs/stents. Comparison Study: No prior study Performing Technologist: Sharion Dove RVS  Examination Guidelines: A complete evaluation includes B-mode imaging, spectral Doppler, color Doppler, and power Doppler as needed of all accessible portions of each vessel. Bilateral testing is considered an integral part of a  complete examination. Limited examinations for reoccurring indications may be performed as noted.  Right Carotid Findings: +----------+--------+--------+--------+------------+------------------+           PSV cm/sEDV cm/sStenosisDescribe    Comments           +----------+--------+--------+--------+------------+------------------+ CCA Prox  57      10                          intimal thickening +----------+--------+--------+--------+------------+------------------+ CCA Distal70      13  intimal thickening +----------+--------+--------+--------+------------+------------------+ ICA Prox  59      19              heterogenousShadowing          +----------+--------+--------+--------+------------+------------------+ ICA Distal80      26                                             +----------+--------+--------+--------+------------+------------------+ ECA       100     11                                             +----------+--------+--------+--------+------------+------------------+ Portions of this table do not appear on this page. +----------+--------+-------+--------+------------+           PSV cm/sEDV cmsDescribeArm Pressure +----------+--------+-------+--------+------------+ Subclavian121                                 +----------+--------+-------+--------+------------+ +---------+--------+--+--------+-+ VertebralPSV cm/s36EDV cm/s9 +---------+--------+--+--------+-+ Left Carotid Findings: +----------+--------+--------+--------+--------+------------------+           PSV cm/sEDV cm/sStenosisDescribeComments           +----------+--------+--------+--------+--------+------------------+ CCA Prox  90      14                      intimal thickening +----------+--------+--------+--------+--------+------------------+ CCA Distal83      14                      intimal thickening  +----------+--------+--------+--------+--------+------------------+ ICA Prox  67      18                                         +----------+--------+--------+--------+--------+------------------+ ICA Distal81      26                                         +----------+--------+--------+--------+--------+------------------+ ECA       99      11                                         +----------+--------+--------+--------+--------+------------------+ +----------+--------+--------+--------+------------+ SubclavianPSV cm/sEDV cm/sDescribeArm Pressure +----------+--------+--------+--------+------------+           132                                  +----------+--------+--------+--------+------------+ +---------+--------+--+--------+-+ VertebralPSV cm/s34EDV cm/s6 +---------+--------+--+--------+-+  ABI Findings: +---------+------------------+-----+-----------+--------+ Right    Rt Pressure (mmHg)IndexWaveform   Comment  +---------+------------------+-----+-----------+--------+ Brachial 127                    multiphasic         +---------+------------------+-----+-----------+--------+ PTA      107               0.84 multiphasic         +---------+------------------+-----+-----------+--------+ DP  105               0.83 multiphasic         +---------+------------------+-----+-----------+--------+ Great Toe90                0.71                     +---------+------------------+-----+-----------+--------+ +---------+------------------+-----+-----------+-------+ Left     Lt Pressure (mmHg)IndexWaveform   Comment +---------+------------------+-----+-----------+-------+ Brachial 127                    multiphasic        +---------+------------------+-----+-----------+-------+ PTA      152               1.20 multiphasic        +---------+------------------+-----+-----------+-------+ DP       136               1.07 multiphasic         +---------+------------------+-----+-----------+-------+ Great Toe117               0.92                    +---------+------------------+-----+-----------+-------+ +-------+---------------+----------------+ ABI/TBIToday's ABI/TBIPrevious ABI/TBI +-------+---------------+----------------+ Right  0.84                            +-------+---------------+----------------+ Left   1.2                             +-------+---------------+----------------+  Right Doppler Findings: +--------+--------+-----+-----------+--------+ Site    PressureIndexDoppler    Comments +--------+--------+-----+-----------+--------+ [id number]          multiphasic         +--------+--------+-----+-----------+--------+ Radial               multiphasic         +--------+--------+-----+-----------+--------+ Ulnar                multiphasic         +--------+--------+-----+-----------+--------+  Left Doppler Findings: +--------+--------+-----+-----------+--------+ Site    PressureIndexDoppler    Comments +--------+--------+-----+-----------+--------+ [id number]          multiphasic         +--------+--------+-----+-----------+--------+ Radial               multiphasic         +--------+--------+-----+-----------+--------+ Ulnar                multiphasic         +--------+--------+-----+-----------+--------+  Summary: Right Carotid: The extracranial vessels were near-normal with only minimal wall                thickening or plaque. Left Carotid: The extracranial vessels were near-normal with only minimal wall               thickening or plaque. Vertebrals:  Bilateral vertebral arteries demonstrate antegrade flow. Subclavians: Normal flow hemodynamics were seen in bilateral subclavian              arteries. Right ABI: Resting right ankle-brachial index indicates mild right lower extremity arterial disease. The right toe-brachial index is normal. Left ABI: Resting left  ankle-brachial index is within normal range. No evidence of significant left lower extremity arterial disease. The left toe-brachial index is normal. Right Upper Extremity: Doppler waveform obliterate with right radial compression. Doppler waveform obliterate with right  ulnar compression. Left Upper Extremity: Doppler waveforms remain within normal limits with left radial compression. Doppler waveforms decrease 50% with left ulnar compression.    Preliminary     Cardiac Studies   Cath: 11/15/20  Conclusions: Three-vessel coronary artery disease with 90% ostial/proximal LAD stenosis that appears to be within old stent though heavy calcification confounds visualization, long proximal through distal LCx disease of up to 70% that is hemodynamically significant (RFR = 0.76), and 50% distal RCA stenosis that is borderline significant (RFR = 0.90). Severely reduced left ventricular systolic function (LVEF 93-79%) with mildly elevated filling pressure (LVEDP ~20 mmHg).   Recommendations: Admit for observation and cardiac surgery consultation for CABG, given worsening chest pain (including at rest prior to today's catheterization), reduced LVEF, and diabetes mellitus. Obtain echocardiogram. Hold prasugrel pending cardiac surgery consultation.  Start IV heparin 2 hours after TR band removal.   Nelva Bush, MD [facility]  Diagnostic Dominance: Right   Echo: 11/15/20 Moderately reduced LVEF of 30 to 35%.  Mid-apical anteroseptal and inferoseptal akinesis.  Apical anterior, lateral and inferior walls akinetic.  GR 1 DD.  Mild LA dilation.  Unable assess RVP but normal RV size and function.  Mild aortic valve sclerosis with no stenosis.  Normal CVP.   Patient Profile     70 y.o. male with history of CAD, prior MI, DM, former tobacco abuse, sleep apnea, HTN, HLD, ischemic cardiomyopathy with recent chest pain concerning for unstable angina. Cardiac cath 11/15/20 with severe three vessel CAD. CT  surgery consulted for CABG    Assessment & Plan    Unstable angina: Underwent cardiac catheterization on 7/7 with three-vessel CAD, 90% ostial/proximal LAD within the old stent, along proximal third distal circumflex disease of 70% and 50% distal RCA.   -- Recommendation for TCTS for CABG.  He was evaluated with plans for surgery in the morning.   --Remains on IV heparin, aspirin, statin, Coreg, Imdur, losartan  Ischemic cardiomyopathy: Echocardiogram noted EF of 30 to 35% with mid to apical, anterior septal and inferior septal akinesis, grade 1 diastolic dysfunction. No overload present on exam. -- Continue Coreg 6.25 mg BID, losartan 25mg  daily -- plan for transition to [facility] prior to discharge   Hyperlipidemia: LDL 121 -- on high dose statin -- plan to add Zetia, suspect will need referral to lipid clinic at discharge   HTN: stable with coreg and losartan  DM: Hgb A1c 8.6 -- currently on SSI -- PTA meds include januvia, jardiance and metformin  OSA: on cpap  For questions or updates, please contact [facility] Please consult [email or web address] for contact info under        Signed, Reino Bellis, NP  11/19/2020, 10:26 AM     ATTENDING ATTESTATION  I have seen, examined and evaluated the patient this AM along with Reino Bellis, NP-C.  After reviewing all the available data and chart, we discussed the patients laboratory, study & physical findings as well as symptoms in detail. I agree with her findings, examination as well as impression recommendations as per our discussion.     Adith is doing well today.  He is eagerly awaiting his surgery tomorrow.  We discussed briefly his coronary disease and answer some questions about surgery recovery.  His lipids are not adequately controlled, I do suspect he probably will need PCSK9 inhibitor on discharge. He has ischemic cardiomyopathy with reduced EF-was converted from ACE inhibitor or ARB yesterday with plans to convert to  [facility] on discharge.  We will  wait till he is stable postop He is on stable dose of carvedilol (please continue carvedilol postop as opposed to metoprolol) also on Jardiance at home.  Currently euvolemic without diuretic requirement.   Remains on IV heparin due to severe CAD and plan for surgery tomorrow.  Plan is to DC on-call to the OR.`  Will follow along postoperatively and be available for assistance if necessary.  When the patient is pending discharge in the next 1 to 2 days, we will be present to assist with med reconciliation.    Glenetta Hew, M.D., M.S. Interventional Cardiologist   Pager # [phone] Phone # [phone] [address] [place], [place] [address]

## 2020-11-19 NOTE — Progress Notes (Signed)
Patient has home unit CPAP at beside. Patient able to place self on and off when ready.

## 2020-11-19 NOTE — TOC Benefit Eligibility Note (Signed)
Patient Teacher, English as a foreign language completed.    The patient is currently admitted and upon discharge could be taking Entresto 24 mg/26 mg.  The current 30 day co-pay is, $38.00.   The patient is insured through [employer] [place] Patient Advocate Specialist [place] Team Direct Number: [phone]  Fax: [phone]

## 2020-11-19 NOTE — Progress Notes (Signed)
CARDIAC REHAB PHASE I   PRE:  Rate/Rhythm: 83 SR  BP:  Supine:   Sitting: 130/75  Standing:    SaO2: 97%RA  MODE:  Ambulation: 1410 ft   POST:  Rate/Rhythm: 95 SR  BP:  Supine:   Sitting: 140/76  Standing:    SaO2: 98%RA [id number] Pt walked 1410 ft on RA pushing IV pole with steady gait. Stated it felt good to walk. No c/o CP. To recliner after walk. Will follow up after surgery.   Graylon Good, RN BSN  11/19/2020 8:43 AM

## 2020-11-19 NOTE — Progress Notes (Signed)
Pt's lab results mailed to pt, pt is not utilizing his MyChart account to see results   

## 2020-11-19 NOTE — Progress Notes (Signed)
VASCULAR LAB    ABIs have been performed.  See CV proc for preliminary results.   Shaley Leavens, RVT 11/19/2020, 9:52 AM

## 2020-11-19 NOTE — Progress Notes (Signed)
ANTICOAGULATION CONSULT NOTE - Follow Up Consult  Pharmacy Consult for IV Heparin Indication: Multi-vessel CAD  Allergies  Allergen Reactions   Hydroxyzine Hcl Other (See Comments)    Weakness, out of this world feeling. sluggishness   Latex Rash    I.e. Elastic= rash to blisters if worn for an extended time Patient show effects after long exposure.    Patient Measurements: Height: 5\' 8"  (172.7 cm) Weight: 84.6 kg (186 lb 8 oz) IBW/kg (Calculated) : 68.4 Heparin Dosing Weight: 85.7 kg   Vital Signs: Temp: 98.1 F (36.7 C) (07/11 0556) Temp Source: Oral (07/11 0556) BP: 134/77 (07/11 0556) Pulse Rate: 79 (07/11 0556)  Labs: Recent Labs    11/17/20 0112 11/18/20 0141 11/18/20 1942 11/19/20 0232  HGB 15.3 15.5  --  15.0  HCT 44.0 44.2  --  43.1  PLT 151 152  --  156  HEPARINUNFRC 0.54 0.63 0.21* 0.47  CREATININE 1.12  --   --   --      Estimated Creatinine Clearance: 65.9 mL/min (by C-G formula based on SCr of 1.12 mg/dL).   Medical History: Past Medical History:  Diagnosis Date   Angina    Asthma    BCC (basal cell carcinoma of skin) 09/06/2020   left upper back lateral (EDC) ,  left upper arm anterior (EDC), left upper back medial (EDC)    Bursitis of left elbow 2018   CAD (coronary artery disease) 04/12/2011   a. remote MI in 1996; b. MI 1997 s/p stenting to LAD & diag; c. MI 2009: long stenting from pLAD to mid LAD and diag & POBA of jailed diag branch; d. cath 2010: 80% ISR of LAD w/ L-R collats, med Rx; e. cath 2012: 50-60% tub mLAD, 60-70% dLAD, FFR 0.75 -->s/p PCI dissection repaired w/ 2 stents (3 total); f. cath 06/2012: no changes from 2012 cath, med Rx, patent stents    Diabetes mellitus without complication [facility])    ED (erectile dysfunction)    Heart disease    HTN (hypertension) 04/12/2011   Hyperlipemia 04/12/2011   Ischemic cardiomyopathy    a. echo 2010: EF 45-50%, mild to mod ant and apical wall HK, trace MR; b. cardiac cath 06/2012: EF 40%,  mild MR    Myocardial infarction (HCC)    x 5   Obesity    OSA (obstructive sleep apnea)    on CPAP   Osteoarthritis    Renal disorder    kidney stone    Assessment: 70 yr old man presented for cath; he was on no anticoagulants PTA (pt was on prasugrel PTA). Cath finding: 3-vessel CAD; CABG planned when prasugrel washed out.   Heparin level 0.47 therapeutic on 1400 units/h.  No bleeding noted, h/h ok plan CABG tomorrow   Goal of Therapy:  Heparin level 0.3-0.5 units/ml (for oozing R radial cath site 7/10) Monitor platelets by anticoagulation protocol: Yes   Plan:  Continue IV heparin drip 1400 units/hr   Daily heparin level, CBC Monitor for s/sx of bleeding    Bonnita Nasuti Pharm.D. CPP, BCPS Clinical Pharmacist [phone] 11/19/2020 10:36 AM   Please check AMION for all [place] phone numbers After 10:00 PM, call [place] [phone]

## 2020-11-20 ENCOUNTER — Inpatient Hospital Stay (HOSPITAL_COMMUNITY)
Admission: RE | Disposition: A | Discharge status: Home Health | Payer: Self-pay | Source: Home / Self Care | Attending: Internal Medicine

## 2020-11-20 ENCOUNTER — Inpatient Hospital Stay (HOSPITAL_COMMUNITY): Payer: Medicare Other | Admitting: Certified Registered Nurse Anesthetist

## 2020-11-20 ENCOUNTER — Inpatient Hospital Stay (HOSPITAL_COMMUNITY): Payer: Medicare Other

## 2020-11-20 DIAGNOSIS — I2510 Atherosclerotic heart disease of native coronary artery without angina pectoris: Secondary | ICD-10-CM | POA: Diagnosis present

## 2020-11-20 DIAGNOSIS — I25110 Atherosclerotic heart disease of native coronary artery with unstable angina pectoris: Secondary | ICD-10-CM

## 2020-11-20 DIAGNOSIS — Z951 Presence of aortocoronary bypass graft: Secondary | ICD-10-CM

## 2020-11-20 HISTORY — PX: CORONARY ARTERY BYPASS GRAFT: SHX141

## 2020-11-20 HISTORY — PX: TEE WITHOUT CARDIOVERSION: SHX5443

## 2020-11-20 LAB — POCT I-STAT 7, (LYTES, BLD GAS, ICA,H+H)
Acid-Base Excess: 0 mmol/L (ref 0.0–2.0)
Acid-Base Excess: 0 mmol/L (ref 0.0–2.0)
Acid-Base Excess: 1 mmol/L (ref 0.0–2.0)
Acid-Base Excess: 0 mmol/L (ref 0.0–2.0)
Acid-base deficit: 1 mmol/L (ref 0.0–2.0)
Acid-base deficit: 3 mmol/L — ABNORMAL HIGH (ref 0.0–2.0)
Acid-base deficit: 4 mmol/L — ABNORMAL HIGH (ref 0.0–2.0)
Acid-base deficit: 9 mmol/L — ABNORMAL HIGH (ref 0.0–2.0)
Acid-base deficit: 4 mmol/L — ABNORMAL HIGH (ref 0.0–2.0)
Bicarbonate: 25.2 mmol/L (ref 20.0–28.0)
Bicarbonate: 25.7 mmol/L (ref 20.0–28.0)
Bicarbonate: 26.3 mmol/L (ref 20.0–28.0)
Bicarbonate: 25.9 mmol/L (ref 20.0–28.0)
Bicarbonate: 24.3 mmol/L (ref 20.0–28.0)
Bicarbonate: 23 mmol/L (ref 20.0–28.0)
Bicarbonate: 21.3 mmol/L (ref 20.0–28.0)
Bicarbonate: 16.4 mmol/L — ABNORMAL LOW (ref 20.0–28.0)
Bicarbonate: 21.8 mmol/L (ref 20.0–28.0)
Calcium, Ion: 1.28 mmol/L (ref 1.15–1.40)
Calcium, Ion: 1.07 mmol/L — ABNORMAL LOW (ref 1.15–1.40)
Calcium, Ion: 1.16 mmol/L (ref 1.15–1.40)
Calcium, Ion: 1.16 mmol/L (ref 1.15–1.40)
Calcium, Ion: 1.16 mmol/L (ref 1.15–1.40)
Calcium, Ion: 1.22 mmol/L (ref 1.15–1.40)
Calcium, Ion: 1.23 mmol/L (ref 1.15–1.40)
Calcium, Ion: 1.21 mmol/L (ref 1.15–1.40)
Calcium, Ion: 1.13 mmol/L — ABNORMAL LOW (ref 1.15–1.40)
HCT: 43 % (ref 39.0–52.0)
HCT: 32 % — ABNORMAL LOW (ref 39.0–52.0)
HCT: 33 % — ABNORMAL LOW (ref 39.0–52.0)
HCT: 33 % — ABNORMAL LOW (ref 39.0–52.0)
HCT: 34 % — ABNORMAL LOW (ref 39.0–52.0)
HCT: 34 % — ABNORMAL LOW (ref 39.0–52.0)
HCT: 33 % — ABNORMAL LOW (ref 39.0–52.0)
HCT: 38 % — ABNORMAL LOW (ref 39.0–52.0)
HCT: 33 % — ABNORMAL LOW (ref 39.0–52.0)
Hemoglobin: 14.6 g/dL (ref 13.0–17.0)
Hemoglobin: 10.9 g/dL — ABNORMAL LOW (ref 13.0–17.0)
Hemoglobin: 11.2 g/dL — ABNORMAL LOW (ref 13.0–17.0)
Hemoglobin: 11.2 g/dL — ABNORMAL LOW (ref 13.0–17.0)
Hemoglobin: 11.6 g/dL — ABNORMAL LOW (ref 13.0–17.0)
Hemoglobin: 11.6 g/dL — ABNORMAL LOW (ref 13.0–17.0)
Hemoglobin: 11.2 g/dL — ABNORMAL LOW (ref 13.0–17.0)
Hemoglobin: 12.9 g/dL — ABNORMAL LOW (ref 13.0–17.0)
Hemoglobin: 11.2 g/dL — ABNORMAL LOW (ref 13.0–17.0)
O2 Saturation: 100 %
O2 Saturation: 100 %
O2 Saturation: 87 %
O2 Saturation: 100 %
O2 Saturation: 100 %
O2 Saturation: 96 %
O2 Saturation: 99 %
O2 Saturation: 99 %
O2 Saturation: 95 %
Patient temperature: 37
Patient temperature: 37
Patient temperature: 37
Patient temperature: 37
Patient temperature: 35.6
Patient temperature: 37.1
Patient temperature: 36.9
Patient temperature: 36.8
Potassium: 4.1 mmol/L (ref 3.5–5.1)
Potassium: 4.3 mmol/L (ref 3.5–5.1)
Potassium: 4.4 mmol/L (ref 3.5–5.1)
Potassium: 4.5 mmol/L (ref 3.5–5.1)
Potassium: 4.3 mmol/L (ref 3.5–5.1)
Potassium: 3.5 mmol/L (ref 3.5–5.1)
Potassium: 4.1 mmol/L (ref 3.5–5.1)
Potassium: 4.4 mmol/L (ref 3.5–5.1)
Potassium: 3.9 mmol/L (ref 3.5–5.1)
Sodium: 139 mmol/L (ref 135–145)
Sodium: 138 mmol/L (ref 135–145)
Sodium: 138 mmol/L (ref 135–145)
Sodium: 138 mmol/L (ref 135–145)
Sodium: 138 mmol/L (ref 135–145)
Sodium: 142 mmol/L (ref 135–145)
Sodium: 140 mmol/L (ref 135–145)
Sodium: 141 mmol/L (ref 135–145)
Sodium: 143 mmol/L (ref 135–145)
TCO2: 27 mmol/L (ref 22–32)
TCO2: 27 mmol/L (ref 22–32)
TCO2: 28 mmol/L (ref 22–32)
TCO2: 27 mmol/L (ref 22–32)
TCO2: 25 mmol/L (ref 22–32)
TCO2: 24 mmol/L (ref 22–32)
TCO2: 23 mmol/L (ref 22–32)
TCO2: 17 mmol/L — ABNORMAL LOW (ref 22–32)
TCO2: 23 mmol/L (ref 22–32)
pCO2 arterial: 46.8 mmHg (ref 32.0–48.0)
pCO2 arterial: 46.9 mmHg (ref 32.0–48.0)
pCO2 arterial: 47.4 mmHg (ref 32.0–48.0)
pCO2 arterial: 41.7 mmHg (ref 32.0–48.0)
pCO2 arterial: 38.1 mmHg (ref 32.0–48.0)
pCO2 arterial: 42.9 mmHg (ref 32.0–48.0)
pCO2 arterial: 40 mmHg (ref 32.0–48.0)
pCO2 arterial: 31.8 mmHg — ABNORMAL LOW (ref 32.0–48.0)
pCO2 arterial: 40.1 mmHg (ref 32.0–48.0)
pH, Arterial: 7.339 — ABNORMAL LOW (ref 7.350–7.450)
pH, Arterial: 7.342 — ABNORMAL LOW (ref 7.350–7.450)
pH, Arterial: 7.356 (ref 7.350–7.450)
pH, Arterial: 7.401 (ref 7.350–7.450)
pH, Arterial: 7.413 (ref 7.350–7.450)
pH, Arterial: 7.331 — ABNORMAL LOW (ref 7.350–7.450)
pH, Arterial: 7.335 — ABNORMAL LOW (ref 7.350–7.450)
pH, Arterial: 7.319 — ABNORMAL LOW (ref 7.350–7.450)
pH, Arterial: 7.343 — ABNORMAL LOW (ref 7.350–7.450)
pO2, Arterial: 473 mmHg — ABNORMAL HIGH (ref 83.0–108.0)
pO2, Arterial: 468 mmHg — ABNORMAL HIGH (ref 83.0–108.0)
pO2, Arterial: 56 mmHg — ABNORMAL LOW (ref 83.0–108.0)
pO2, Arterial: 309 mmHg — ABNORMAL HIGH (ref 83.0–108.0)
pO2, Arterial: 316 mmHg — ABNORMAL HIGH (ref 83.0–108.0)
pO2, Arterial: 79 mmHg — ABNORMAL LOW (ref 83.0–108.0)
pO2, Arterial: 160 mmHg — ABNORMAL HIGH (ref 83.0–108.0)
pO2, Arterial: 133 mmHg — ABNORMAL HIGH (ref 83.0–108.0)
pO2, Arterial: 77 mmHg — ABNORMAL LOW (ref 83.0–108.0)

## 2020-11-20 LAB — CBC
HCT: 44.4 % (ref 39.0–52.0)
HCT: 36.3 % — ABNORMAL LOW (ref 39.0–52.0)
HCT: 39.5 % (ref 39.0–52.0)
Hemoglobin: 15.2 g/dL (ref 13.0–17.0)
Hemoglobin: 12.4 g/dL — ABNORMAL LOW (ref 13.0–17.0)
Hemoglobin: 13.3 g/dL (ref 13.0–17.0)
MCH: 32.3 pg (ref 26.0–34.0)
MCH: 32.8 pg (ref 26.0–34.0)
MCH: 32.4 pg (ref 26.0–34.0)
MCHC: 34.2 g/dL (ref 30.0–36.0)
MCHC: 34.2 g/dL (ref 30.0–36.0)
MCHC: 33.7 g/dL (ref 30.0–36.0)
MCV: 94.5 fL (ref 80.0–100.0)
MCV: 96 fL (ref 80.0–100.0)
MCV: 96.3 fL (ref 80.0–100.0)
Platelets: 165 10*3/uL (ref 150–400)
Platelets: 123 10*3/uL — ABNORMAL LOW (ref 150–400)
Platelets: 164 10*3/uL (ref 150–400)
RBC: 4.7 MIL/uL (ref 4.22–5.81)
RBC: 3.78 MIL/uL — ABNORMAL LOW (ref 4.22–5.81)
RBC: 4.1 MIL/uL — ABNORMAL LOW (ref 4.22–5.81)
RDW: 12.3 % (ref 11.5–15.5)
RDW: 12.2 % (ref 11.5–15.5)
RDW: 12.3 % (ref 11.5–15.5)
WBC: 6.6 10*3/uL (ref 4.0–10.5)
WBC: 13 10*3/uL — ABNORMAL HIGH (ref 4.0–10.5)
WBC: 16.9 10*3/uL — ABNORMAL HIGH (ref 4.0–10.5)
nRBC: 0 % (ref 0.0–0.2)
nRBC: 0 % (ref 0.0–0.2)
nRBC: 0 % (ref 0.0–0.2)

## 2020-11-20 LAB — PROTIME-INR
INR: 1 (ref 0.8–1.2)
INR: 1.3 — ABNORMAL HIGH (ref 0.8–1.2)
Prothrombin Time: 13.2 seconds (ref 11.4–15.2)
Prothrombin Time: 15.7 seconds — ABNORMAL HIGH (ref 11.4–15.2)

## 2020-11-20 LAB — POCT I-STAT, CHEM 8
BUN: 18 mg/dL (ref 8–23)
BUN: 17 mg/dL (ref 8–23)
BUN: 17 mg/dL (ref 8–23)
BUN: 16 mg/dL (ref 8–23)
BUN: 16 mg/dL (ref 8–23)
Calcium, Ion: 1.31 mmol/L (ref 1.15–1.40)
Calcium, Ion: 1.3 mmol/L (ref 1.15–1.40)
Calcium, Ion: 1.16 mmol/L (ref 1.15–1.40)
Calcium, Ion: 1.16 mmol/L (ref 1.15–1.40)
Calcium, Ion: 1.14 mmol/L — ABNORMAL LOW (ref 1.15–1.40)
Chloride: 103 mmol/L (ref 98–111)
Chloride: 104 mmol/L (ref 98–111)
Chloride: 103 mmol/L (ref 98–111)
Chloride: 104 mmol/L (ref 98–111)
Chloride: 104 mmol/L (ref 98–111)
Creatinine, Ser: 0.7 mg/dL (ref 0.61–1.24)
Creatinine, Ser: 0.8 mg/dL (ref 0.61–1.24)
Creatinine, Ser: 0.7 mg/dL (ref 0.61–1.24)
Creatinine, Ser: 0.7 mg/dL (ref 0.61–1.24)
Creatinine, Ser: 0.7 mg/dL (ref 0.61–1.24)
Glucose, Bld: 158 mg/dL — ABNORMAL HIGH (ref 70–99)
Glucose, Bld: 150 mg/dL — ABNORMAL HIGH (ref 70–99)
Glucose, Bld: 113 mg/dL — ABNORMAL HIGH (ref 70–99)
Glucose, Bld: 127 mg/dL — ABNORMAL HIGH (ref 70–99)
Glucose, Bld: 126 mg/dL — ABNORMAL HIGH (ref 70–99)
HCT: 43 % (ref 39.0–52.0)
HCT: 39 % (ref 39.0–52.0)
HCT: 32 % — ABNORMAL LOW (ref 39.0–52.0)
HCT: 33 % — ABNORMAL LOW (ref 39.0–52.0)
HCT: 33 % — ABNORMAL LOW (ref 39.0–52.0)
Hemoglobin: 14.6 g/dL (ref 13.0–17.0)
Hemoglobin: 13.3 g/dL (ref 13.0–17.0)
Hemoglobin: 10.9 g/dL — ABNORMAL LOW (ref 13.0–17.0)
Hemoglobin: 11.2 g/dL — ABNORMAL LOW (ref 13.0–17.0)
Hemoglobin: 11.2 g/dL — ABNORMAL LOW (ref 13.0–17.0)
Potassium: 4.1 mmol/L (ref 3.5–5.1)
Potassium: 4.1 mmol/L (ref 3.5–5.1)
Potassium: 4.4 mmol/L (ref 3.5–5.1)
Potassium: 4.3 mmol/L (ref 3.5–5.1)
Potassium: 4.3 mmol/L (ref 3.5–5.1)
Sodium: 139 mmol/L (ref 135–145)
Sodium: 137 mmol/L (ref 135–145)
Sodium: 137 mmol/L (ref 135–145)
Sodium: 138 mmol/L (ref 135–145)
Sodium: 137 mmol/L (ref 135–145)
TCO2: 25 mmol/L (ref 22–32)
TCO2: 24 mmol/L (ref 22–32)
TCO2: 29 mmol/L (ref 22–32)
TCO2: 25 mmol/L (ref 22–32)
TCO2: 25 mmol/L (ref 22–32)

## 2020-11-20 LAB — GLUCOSE, CAPILLARY
Glucose-Capillary: 106 mg/dL — ABNORMAL HIGH (ref 70–99)
Glucose-Capillary: 105 mg/dL — ABNORMAL HIGH (ref 70–99)
Glucose-Capillary: 73 mg/dL (ref 70–99)
Glucose-Capillary: 118 mg/dL — ABNORMAL HIGH (ref 70–99)
Glucose-Capillary: 89 mg/dL (ref 70–99)
Glucose-Capillary: 132 mg/dL — ABNORMAL HIGH (ref 70–99)
Glucose-Capillary: 145 mg/dL — ABNORMAL HIGH (ref 70–99)
Glucose-Capillary: 126 mg/dL — ABNORMAL HIGH (ref 70–99)
Glucose-Capillary: 154 mg/dL — ABNORMAL HIGH (ref 70–99)
Glucose-Capillary: 146 mg/dL — ABNORMAL HIGH (ref 70–99)

## 2020-11-20 LAB — BLOOD GAS, ARTERIAL
Acid-base deficit: 3.6 mmol/L — ABNORMAL HIGH (ref 0.0–2.0)
Bicarbonate: 20.9 mmol/L (ref 20.0–28.0)
Drawn by: 345601
FIO2: 21
O2 Saturation: 95.3 %
Patient temperature: 36.6
pCO2 arterial: 37 mmHg (ref 32.0–48.0)
pH, Arterial: 7.368 (ref 7.350–7.450)
pO2, Arterial: 78.8 mmHg — ABNORMAL LOW (ref 83.0–108.0)

## 2020-11-20 LAB — BASIC METABOLIC PANEL
Anion gap: 13 (ref 5–15)
BUN: 13 mg/dL (ref 8–23)
CO2: 17 mmol/L — ABNORMAL LOW (ref 22–32)
Calcium: 8.3 mg/dL — ABNORMAL LOW (ref 8.9–10.3)
Chloride: 106 mmol/L (ref 98–111)
Creatinine, Ser: 1.09 mg/dL (ref 0.61–1.24)
GFR, Estimated: >60 mL/min (ref >60)
Glucose, Bld: 143 mg/dL — ABNORMAL HIGH (ref 70–99)
Potassium: 4.3 mmol/L (ref 3.5–5.1)
Sodium: 136 mmol/L (ref 135–145)

## 2020-11-20 LAB — COMPREHENSIVE METABOLIC PANEL
ALT: 67 U/L — ABNORMAL HIGH (ref 0–44)
AST: 58 U/L — ABNORMAL HIGH (ref 15–41)
Albumin: 3.9 g/dL (ref 3.5–5.0)
Alkaline Phosphatase: 40 U/L (ref 38–126)
Anion gap: 7 (ref 5–15)
BUN: 20 mg/dL (ref 8–23)
CO2: 24 mmol/L (ref 22–32)
Calcium: 9.5 mg/dL (ref 8.9–10.3)
Chloride: 105 mmol/L (ref 98–111)
Creatinine, Ser: 1.02 mg/dL (ref 0.61–1.24)
GFR, Estimated: >60 mL/min (ref >60)
Glucose, Bld: 136 mg/dL — ABNORMAL HIGH (ref 70–99)
Potassium: 3.9 mmol/L (ref 3.5–5.1)
Sodium: 136 mmol/L (ref 135–145)
Total Bilirubin: 1.1 mg/dL (ref 0.3–1.2)
Total Protein: 6.5 g/dL (ref 6.5–8.1)

## 2020-11-20 LAB — APTT
aPTT: 78 seconds — ABNORMAL HIGH (ref 24–36)
aPTT: 30 seconds (ref 24–36)

## 2020-11-20 LAB — HEMOGLOBIN AND HEMATOCRIT, BLOOD
HCT: 33.9 % — ABNORMAL LOW (ref 39.0–52.0)
Hemoglobin: 11.8 g/dL — ABNORMAL LOW (ref 13.0–17.0)

## 2020-11-20 LAB — HEPARIN LEVEL (UNFRACTIONATED): Heparin Unfractionated: 0.54 IU/mL (ref 0.30–0.70)

## 2020-11-20 LAB — MAGNESIUM: Magnesium: 3 mg/dL — ABNORMAL HIGH (ref 1.7–2.4)

## 2020-11-20 LAB — PLATELET COUNT: Platelets: 151 10*3/uL (ref 150–400)

## 2020-11-20 SURGERY — CORONARY ARTERY BYPASS GRAFTING (CABG)
Anesthesia: General | Site: Chest

## 2020-11-20 MED ORDER — SODIUM CHLORIDE 0.45 % IV SOLN: INTRAVENOUS | Status: DC | PRN

## 2020-11-20 MED ORDER — SODIUM CHLORIDE (PF) 0.9 % IJ SOLN
INTRAMUSCULAR | Status: AC
  Filled 2020-11-20: qty 10

## 2020-11-20 MED ORDER — SODIUM BICARBONATE 4.2 % IV SOLN: 100.0000 meq | Freq: Once | INTRAVENOUS | Status: DC

## 2020-11-20 MED ORDER — COLCHICINE 0.3 MG HALF TABLET
0.3000 mg | ORAL_TABLET | Freq: Two times a day (BID) | ORAL | Status: DC
  Administered 2020-11-20 – 2020-11-24 (×8): 0.3 mg via ORAL
  Filled 2020-11-20 (×8): qty 1

## 2020-11-20 MED ORDER — MIDAZOLAM HCL 2 MG/2ML IJ SOLN: 2.0000 mg | INTRAMUSCULAR | Status: DC | PRN

## 2020-11-20 MED ORDER — CHLORHEXIDINE GLUCONATE 0.12 % MT SOLN
15.0000 mL | OROMUCOSAL | Status: AC
  Administered 2020-11-20: 15 mL via OROMUCOSAL

## 2020-11-20 MED ORDER — MAGNESIUM SULFATE 4 GM/100ML IV SOLN
4.0000 g | Freq: Once | INTRAVENOUS | Status: AC
  Administered 2020-11-20: 4 g via INTRAVENOUS
  Filled 2020-11-20: qty 100

## 2020-11-20 MED ORDER — LACTATED RINGERS IV SOLN: INTRAVENOUS | Status: DC

## 2020-11-20 MED ORDER — CEFAZOLIN SODIUM-DEXTROSE 2-4 GM/100ML-% IV SOLN
2.0000 g | Freq: Three times a day (TID) | INTRAVENOUS | Status: AC
  Administered 2020-11-20 – 2020-11-22 (×6): 2 g via INTRAVENOUS
  Filled 2020-11-20 (×6): qty 100

## 2020-11-20 MED ORDER — PROPOFOL 10 MG/ML IV BOLUS
INTRAVENOUS | Status: AC
  Filled 2020-11-20: qty 20

## 2020-11-20 MED ORDER — MORPHINE SULFATE (PF) 2 MG/ML IV SOLN: 1.0000 mg | INTRAVENOUS | Status: DC | PRN

## 2020-11-20 MED ORDER — SODIUM CHLORIDE 0.9% FLUSH
3.0000 mL | Freq: Two times a day (BID) | INTRAVENOUS | Status: DC
  Administered 2020-11-21 – 2020-11-22 (×3): 3 mL via INTRAVENOUS

## 2020-11-20 MED ORDER — CHLORHEXIDINE GLUCONATE CLOTH 2 % EX PADS
6.0000 | MEDICATED_PAD | Freq: Every day | CUTANEOUS | Status: DC
  Administered 2020-11-20 – 2020-11-24 (×4): 6 via TOPICAL

## 2020-11-20 MED ORDER — STERILE WATER FOR INJECTION IJ SOLN
INTRAMUSCULAR | Status: AC
  Filled 2020-11-20: qty 10

## 2020-11-20 MED ORDER — PHENYLEPHRINE HCL-NACL 20-0.9 MG/250ML-% IV SOLN
INTRAVENOUS | Status: DC | PRN
  Administered 2020-11-20: 25 ug/min via INTRAVENOUS

## 2020-11-20 MED ORDER — STERILE WATER FOR INJECTION IJ SOLN: INTRAMUSCULAR | Status: DC | PRN

## 2020-11-20 MED ORDER — LIDOCAINE 2% (20 MG/ML) 5 ML SYRINGE
INTRAMUSCULAR | Status: DC | PRN
  Administered 2020-11-20: 80 mg via INTRAVENOUS

## 2020-11-20 MED ORDER — PROPOFOL 10 MG/ML IV BOLUS
INTRAVENOUS | Status: DC | PRN
  Administered 2020-11-20: 50 mg via INTRAVENOUS

## 2020-11-20 MED ORDER — ASPIRIN EC 325 MG PO TBEC
325.0000 mg | DELAYED_RELEASE_TABLET | Freq: Every day | ORAL | Status: DC
  Administered 2020-11-21 – 2020-11-24 (×4): 325 mg via ORAL
  Filled 2020-11-20 (×4): qty 1

## 2020-11-20 MED ORDER — BISACODYL 10 MG RE SUPP: 10.0000 mg | Freq: Every day | RECTAL | Status: DC

## 2020-11-20 MED ORDER — PLATELET POOR PLASMA OPTIME
Status: DC | PRN
  Administered 2020-11-20: 10 mL

## 2020-11-20 MED ORDER — SODIUM CHLORIDE 0.9 % IV SOLN: 250.0000 mL | INTRAVENOUS | Status: DC

## 2020-11-20 MED ORDER — KETOROLAC TROMETHAMINE 15 MG/ML IJ SOLN
7.5000 mg | Freq: Four times a day (QID) | INTRAMUSCULAR | Status: AC
  Administered 2020-11-20 – 2020-11-21 (×5): 7.5 mg via INTRAVENOUS
  Filled 2020-11-20 (×5): qty 1

## 2020-11-20 MED ORDER — ALBUMIN HUMAN 5 % IV SOLN: INTRAVENOUS | Status: DC | PRN

## 2020-11-20 MED ORDER — DEXMEDETOMIDINE HCL IN NACL 400 MCG/100ML IV SOLN
0.0000 ug/kg/h | INTRAVENOUS | Status: DC
  Administered 2020-11-20: 0.4 ug/kg/h via INTRAVENOUS
  Filled 2020-11-20: qty 100

## 2020-11-20 MED ORDER — BUPIVACAINE LIPOSOME 1.3 % IJ SUSP
INTRAMUSCULAR | Status: DC | PRN
  Administered 2020-11-20: 50 mL

## 2020-11-20 MED ORDER — ACETAMINOPHEN 160 MG/5ML PO SOLN: 650.0000 mg | Freq: Once | ORAL | Status: AC

## 2020-11-20 MED ORDER — PROTAMINE SULFATE 10 MG/ML IV SOLN
INTRAVENOUS | Status: AC
  Filled 2020-11-20: qty 25

## 2020-11-20 MED ORDER — ONDANSETRON HCL 4 MG/2ML IJ SOLN
4.0000 mg | Freq: Four times a day (QID) | INTRAMUSCULAR | Status: DC | PRN
  Administered 2020-11-20 – 2020-11-21 (×2): 4 mg via INTRAVENOUS
  Filled 2020-11-20 (×3): qty 2

## 2020-11-20 MED ORDER — FENTANYL CITRATE (PF) 100 MCG/2ML IJ SOLN
INTRAMUSCULAR | Status: DC | PRN
  Administered 2020-11-20: 150 ug via INTRAVENOUS
  Administered 2020-11-20: 200 ug via INTRAVENOUS
  Administered 2020-11-20: 250 ug via INTRAVENOUS
  Administered 2020-11-20: 200 ug via INTRAVENOUS
  Administered 2020-11-20: 100 ug via INTRAVENOUS
  Administered 2020-11-20 (×2): 50 ug via INTRAVENOUS
  Administered 2020-11-20: 100 ug via INTRAVENOUS

## 2020-11-20 MED ORDER — BUPIVACAINE HCL (PF) 0.5 % IJ SOLN
INTRAMUSCULAR | Status: AC
  Filled 2020-11-20: qty 30

## 2020-11-20 MED ORDER — NITROGLYCERIN IN D5W 200-5 MCG/ML-% IV SOLN: 0.0000 ug/min | INTRAVENOUS | Status: DC

## 2020-11-20 MED ORDER — VANCOMYCIN HCL 1000 MG IV SOLR
INTRAVENOUS | Status: DC | PRN
  Administered 2020-11-20: 3 g via TOPICAL

## 2020-11-20 MED ORDER — BUPIVACAINE LIPOSOME 1.3 % IJ SUSP
INTRAMUSCULAR | Status: AC
  Filled 2020-11-20: qty 20

## 2020-11-20 MED ORDER — METOPROLOL TARTRATE 12.5 MG HALF TABLET
12.5000 mg | ORAL_TABLET | Freq: Two times a day (BID) | ORAL | Status: DC
  Administered 2020-11-20 – 2020-11-21 (×2): 12.5 mg via ORAL
  Filled 2020-11-20 (×2): qty 1

## 2020-11-20 MED ORDER — ACETAMINOPHEN 500 MG PO TABS
1000.0000 mg | ORAL_TABLET | Freq: Four times a day (QID) | ORAL | Status: DC
  Administered 2020-11-21 – 2020-11-24 (×13): 1000 mg via ORAL
  Filled 2020-11-20 (×13): qty 2

## 2020-11-20 MED ORDER — LACTATED RINGERS IV SOLN: INTRAVENOUS | Status: DC | PRN

## 2020-11-20 MED ORDER — HEPARIN SODIUM (PORCINE) 1000 UNIT/ML IJ SOLN
INTRAMUSCULAR | Status: AC
  Filled 2020-11-20: qty 1

## 2020-11-20 MED ORDER — MIDAZOLAM HCL (PF) 10 MG/2ML IJ SOLN
INTRAMUSCULAR | Status: AC
  Filled 2020-11-20: qty 2

## 2020-11-20 MED ORDER — DIAZEPAM 2 MG PO TABS
2.0000 mg | ORAL_TABLET | Freq: Three times a day (TID) | ORAL | Status: AC
  Administered 2020-11-20 – 2020-11-22 (×4): 2 mg via ORAL
  Filled 2020-11-20 (×4): qty 1

## 2020-11-20 MED ORDER — PROTAMINE SULFATE 10 MG/ML IV SOLN
INTRAVENOUS | Status: DC | PRN
  Administered 2020-11-20: 300 mg via INTRAVENOUS

## 2020-11-20 MED ORDER — SODIUM CHLORIDE 0.9 % IV SOLN: INTRAVENOUS | Status: DC

## 2020-11-20 MED ORDER — PANTOPRAZOLE SODIUM 40 MG PO TBEC
40.0000 mg | DELAYED_RELEASE_TABLET | Freq: Every day | ORAL | Status: DC
  Administered 2020-11-22 – 2020-11-24 (×3): 40 mg via ORAL
  Filled 2020-11-20 (×3): qty 1

## 2020-11-20 MED ORDER — LEVALBUTEROL TARTRATE 45 MCG/ACT IN AERO
2.0000 | INHALATION_SPRAY | Freq: Four times a day (QID) | RESPIRATORY_TRACT | Status: DC
  Filled 2020-11-20: qty 15

## 2020-11-20 MED ORDER — HEMOSTATIC AGENTS (NO CHARGE) OPTIME
TOPICAL | Status: DC | PRN
  Administered 2020-11-20 (×2): 1 via TOPICAL

## 2020-11-20 MED ORDER — ARTIFICIAL TEARS OPHTHALMIC OINT
TOPICAL_OINTMENT | OPHTHALMIC | Status: AC
  Filled 2020-11-20: qty 3.5

## 2020-11-20 MED ORDER — PLASMA-LYTE A IV SOLN: INTRAVENOUS | Status: DC

## 2020-11-20 MED ORDER — 0.9 % SODIUM CHLORIDE (POUR BTL) OPTIME
TOPICAL | Status: DC | PRN
  Administered 2020-11-20: 5000 mL

## 2020-11-20 MED ORDER — FENTANYL CITRATE (PF) 250 MCG/5ML IJ SOLN
INTRAMUSCULAR | Status: AC
  Filled 2020-11-20: qty 25

## 2020-11-20 MED ORDER — PHENYLEPHRINE 40 MCG/ML (10ML) SYRINGE FOR IV PUSH (FOR BLOOD PRESSURE SUPPORT)
PREFILLED_SYRINGE | INTRAVENOUS | Status: AC
  Filled 2020-11-20: qty 20

## 2020-11-20 MED ORDER — LIDOCAINE 2% (20 MG/ML) 5 ML SYRINGE
INTRAMUSCULAR | Status: AC
  Filled 2020-11-20: qty 5

## 2020-11-20 MED ORDER — TRANEXAMIC ACID 1000 MG/10ML IV SOLN
INTRAVENOUS | Status: DC | PRN
  Administered 2020-11-20: 1.5 mg/kg/h via INTRAVENOUS

## 2020-11-20 MED ORDER — PLASMA-LYTE A IV SOLN
INTRAVENOUS | Status: DC | PRN
  Administered 2020-11-20: 1000 mL

## 2020-11-20 MED ORDER — POTASSIUM CHLORIDE 10 MEQ/50ML IV SOLN
10.0000 meq | INTRAVENOUS | Status: AC
  Administered 2020-11-20 (×2): 10 meq via INTRAVENOUS

## 2020-11-20 MED ORDER — INSULIN REGULAR(HUMAN) IN NACL 100-0.9 UT/100ML-% IV SOLN
INTRAVENOUS | Status: DC | PRN
  Administered 2020-11-20: 1 [IU]/h via INTRAVENOUS

## 2020-11-20 MED ORDER — VANCOMYCIN HCL 1000 MG IV SOLR
INTRAVENOUS | Status: AC
  Filled 2020-11-20: qty 3000

## 2020-11-20 MED ORDER — INSULIN REGULAR(HUMAN) IN NACL 100-0.9 UT/100ML-% IV SOLN: INTRAVENOUS | Status: DC

## 2020-11-20 MED ORDER — ACETAMINOPHEN 160 MG/5ML PO SOLN: 1000.0000 mg | Freq: Four times a day (QID) | ORAL | Status: DC

## 2020-11-20 MED ORDER — THROMBIN 5000 UNITS EX SOLR
INTRAVENOUS | Status: DC | PRN
  Administered 2020-11-20: 2 mL

## 2020-11-20 MED ORDER — ARTIFICIAL TEARS OPHTHALMIC OINT
TOPICAL_OINTMENT | OPHTHALMIC | Status: DC | PRN
  Administered 2020-11-20: 1 via OPHTHALMIC

## 2020-11-20 MED ORDER — DEXMEDETOMIDINE HCL IN NACL 400 MCG/100ML IV SOLN
INTRAVENOUS | Status: DC | PRN
  Administered 2020-11-20: .4 ug/kg/h via INTRAVENOUS

## 2020-11-20 MED ORDER — OXYCODONE HCL 5 MG PO TABS
5.0000 mg | ORAL_TABLET | ORAL | Status: DC | PRN
  Administered 2020-11-20 – 2020-11-21 (×2): 10 mg via ORAL
  Administered 2020-11-21: 5 mg via ORAL
  Administered 2020-11-22: 10 mg via ORAL
  Filled 2020-11-20 (×2): qty 2
  Filled 2020-11-20: qty 1
  Filled 2020-11-20: qty 2

## 2020-11-20 MED ORDER — ASPIRIN 81 MG PO CHEW: 324.0000 mg | CHEWABLE_TABLET | Freq: Every day | ORAL | Status: DC

## 2020-11-20 MED ORDER — FAMOTIDINE IN NACL 20-0.9 MG/50ML-% IV SOLN
20.0000 mg | Freq: Two times a day (BID) | INTRAVENOUS | Status: AC
  Administered 2020-11-20: 20 mg via INTRAVENOUS
  Filled 2020-11-20: qty 50

## 2020-11-20 MED ORDER — ACETAMINOPHEN 650 MG RE SUPP
650.0000 mg | Freq: Once | RECTAL | Status: AC
  Administered 2020-11-20: 650 mg via RECTAL

## 2020-11-20 MED ORDER — MIDAZOLAM HCL 5 MG/5ML IJ SOLN
INTRAMUSCULAR | Status: DC | PRN
  Administered 2020-11-20: 2 mg via INTRAVENOUS
  Administered 2020-11-20: 3 mg via INTRAVENOUS
  Administered 2020-11-20 (×2): 2 mg via INTRAVENOUS
  Administered 2020-11-20: 1 mg via INTRAVENOUS

## 2020-11-20 MED ORDER — PHENYLEPHRINE 40 MCG/ML (10ML) SYRINGE FOR IV PUSH (FOR BLOOD PRESSURE SUPPORT)
PREFILLED_SYRINGE | INTRAVENOUS | Status: DC | PRN
  Administered 2020-11-20: 120 ug via INTRAVENOUS
  Administered 2020-11-20: 80 ug via INTRAVENOUS
  Administered 2020-11-20: 120 ug via INTRAVENOUS

## 2020-11-20 MED ORDER — LEVALBUTEROL HCL 0.63 MG/3ML IN NEBU: 0.6300 mg | INHALATION_SOLUTION | Freq: Four times a day (QID) | RESPIRATORY_TRACT | Status: DC | PRN

## 2020-11-20 MED ORDER — SODIUM BICARBONATE 8.4 % IV SOLN
100.0000 meq | Freq: Once | INTRAVENOUS | Status: AC
  Administered 2020-11-20: 100 meq via INTRAVENOUS

## 2020-11-20 MED ORDER — TRAMADOL HCL 50 MG PO TABS
50.0000 mg | ORAL_TABLET | ORAL | Status: DC | PRN
Start: 2020-11-20 — End: 2020-11-24
  Administered 2020-11-21: 50 mg via ORAL
  Filled 2020-11-20: qty 1

## 2020-11-20 MED ORDER — METOPROLOL TARTRATE 25 MG/10 ML ORAL SUSPENSION: 12.5000 mg | Freq: Two times a day (BID) | ORAL | Status: DC

## 2020-11-20 MED ORDER — ALBUMIN HUMAN 5 % IV SOLN
250.0000 mL | INTRAVENOUS | Status: AC | PRN
Start: 2020-11-20 — End: 2020-11-21
  Administered 2020-11-20: 12.5 g via INTRAVENOUS

## 2020-11-20 MED ORDER — BISACODYL 5 MG PO TBEC
10.0000 mg | DELAYED_RELEASE_TABLET | Freq: Every day | ORAL | Status: DC
  Administered 2020-11-21 – 2020-11-24 (×4): 10 mg via ORAL
  Filled 2020-11-20 (×4): qty 2

## 2020-11-20 MED ORDER — SODIUM CHLORIDE 0.9% FLUSH
3.0000 mL | INTRAVENOUS | Status: DC | PRN
  Administered 2020-11-23: 3 mL via INTRAVENOUS

## 2020-11-20 MED ORDER — HEPARIN SODIUM (PORCINE) 1000 UNIT/ML IJ SOLN
INTRAMUSCULAR | Status: DC | PRN
  Administered 2020-11-20: 30000 [IU] via INTRAVENOUS

## 2020-11-20 MED ORDER — ROCURONIUM BROMIDE 10 MG/ML (PF) SYRINGE
PREFILLED_SYRINGE | INTRAVENOUS | Status: AC
  Filled 2020-11-20: qty 10

## 2020-11-20 MED ORDER — ROCURONIUM BROMIDE 10 MG/ML (PF) SYRINGE
PREFILLED_SYRINGE | INTRAVENOUS | Status: DC | PRN
  Administered 2020-11-20: 50 mg via INTRAVENOUS
  Administered 2020-11-20: 100 mg via INTRAVENOUS

## 2020-11-20 MED ORDER — METOPROLOL TARTRATE 5 MG/5ML IV SOLN: 2.5000 mg | INTRAVENOUS | Status: DC | PRN

## 2020-11-20 MED ORDER — LACTATED RINGERS IV SOLN: 500.0000 mL | Freq: Once | INTRAVENOUS | Status: DC | PRN

## 2020-11-20 MED ORDER — DOCUSATE SODIUM 100 MG PO CAPS
200.0000 mg | ORAL_CAPSULE | Freq: Every day | ORAL | Status: DC
  Administered 2020-11-21 – 2020-11-24 (×4): 200 mg via ORAL
  Filled 2020-11-20 (×4): qty 2

## 2020-11-20 MED ORDER — PHENYLEPHRINE HCL-NACL 20-0.9 MG/250ML-% IV SOLN: 0.0000 ug/min | INTRAVENOUS | Status: DC

## 2020-11-20 MED ORDER — SODIUM CHLORIDE 0.9 % IV SOLN
INTRAVENOUS | Status: DC | PRN
  Administered 2020-11-20: 2 ug/min via INTRAVENOUS

## 2020-11-20 MED ORDER — DEXTROSE 50 % IV SOLN: 0.0000 mL | INTRAVENOUS | Status: DC | PRN

## 2020-11-20 MED ORDER — PLATELET RICH PLASMA OPTIME
Status: DC | PRN
  Administered 2020-11-20: 10 mL

## 2020-11-20 MED ORDER — VANCOMYCIN HCL IN DEXTROSE 1-5 GM/200ML-% IV SOLN
1000.0000 mg | Freq: Once | INTRAVENOUS | Status: AC
  Administered 2020-11-20: 1000 mg via INTRAVENOUS
  Filled 2020-11-20: qty 200

## 2020-11-20 SURGICAL SUPPLY — 112 items
ADAPTER CARDIO PERF ANTE/RETRO (ADAPTER) ×6 IMPLANT
APPLICATOR TIP COSEAL (VASCULAR PRODUCTS) IMPLANT
APPLIER CLIP 9.375 SM OPEN (CLIP) ×6
BAG DECANTER FOR FLEXI CONT (MISCELLANEOUS) ×6 IMPLANT
BLADE CLIPPER SURG (BLADE) ×6 IMPLANT
BLADE STERNUM SYSTEM 6 (BLADE) ×6 IMPLANT
BLADE SURG 11 STRL SS (BLADE) ×6 IMPLANT
BLADE SURG 15 STRL LF DISP TIS (BLADE) ×4 IMPLANT
BLADE SURG 15 STRL SS (BLADE) ×2
BNDG ELASTIC 4X5.8 VLCR STR LF (GAUZE/BANDAGES/DRESSINGS) ×6 IMPLANT
BNDG ELASTIC 6X10 VLCR STRL LF (GAUZE/BANDAGES/DRESSINGS) ×6 IMPLANT
BNDG ELASTIC 6X5.8 VLCR STR LF (GAUZE/BANDAGES/DRESSINGS) ×6 IMPLANT
BNDG GAUZE ELAST 4 BULKY (GAUZE/BANDAGES/DRESSINGS) ×6 IMPLANT
CANISTER SUCT 3000ML PPV (MISCELLANEOUS) ×6 IMPLANT
CANNULA DUAL 20GA X18 (CANNULA) ×10 IMPLANT
CANNULA DUAL 20GA X18CM (CANNULA) ×2
CANNULA NON VENT 20FR 12 (CANNULA) ×6 IMPLANT
CATH CPB KIT HENDRICKSON (MISCELLANEOUS) ×6 IMPLANT
CATH ROBINSON RED A/P 18FR (CATHETERS) IMPLANT
CLIP APPLIE 9.375 SM OPEN (CLIP) ×4 IMPLANT
CLIP RETRACTION 3.0MM CORONARY (MISCELLANEOUS) ×6 IMPLANT
CLIP VESOCCLUDE MED 24/CT (CLIP) IMPLANT
CLIP VESOCCLUDE SM WIDE 24/CT (CLIP) IMPLANT
CONTAINER PROTECT SURGISLUSH (MISCELLANEOUS) ×12 IMPLANT
COVER MAYO STAND STRL (DRAPES) ×12 IMPLANT
CUFF TOURN SGL QUICK 18X4 (TOURNIQUET CUFF) IMPLANT
CUFF TOURN SGL QUICK 24 (TOURNIQUET CUFF)
CUFF TRNQT CYL 24X4X16.5-23 (TOURNIQUET CUFF) IMPLANT
DERMABOND ADVANCED (GAUZE/BANDAGES/DRESSINGS) ×2
DERMABOND ADVANCED .7 DNX12 (GAUZE/BANDAGES/DRESSINGS) ×4 IMPLANT
DRAIN CHANNEL 28F RND 3/8 FF (WOUND CARE) ×18 IMPLANT
DRAPE CARDIOVASCULAR INCISE (DRAPES) ×2
DRAPE EXTREMITY T 121X128X90 (DISPOSABLE) ×6 IMPLANT
DRAPE HALF SHEET 40X57 (DRAPES) ×6 IMPLANT
DRAPE SRG 135X102X78XABS (DRAPES) ×4 IMPLANT
DRAPE WARM FLUID 44X44 (DRAPES) ×6 IMPLANT
DRSG AQUACEL AG ADV 3.5X14 (GAUZE/BANDAGES/DRESSINGS) ×6 IMPLANT
ELECT CAUTERY BLADE 6.4 (BLADE) ×6 IMPLANT
ELECT REM PT RETURN 9FT ADLT (ELECTROSURGICAL) ×12
ELECTRODE REM PT RTRN 9FT ADLT (ELECTROSURGICAL) ×8 IMPLANT
FELT TEFLON 1X6 (MISCELLANEOUS) ×6 IMPLANT
GAUZE 4X4 16PLY ~~LOC~~+RFID DBL (SPONGE) ×6 IMPLANT
GAUZE SPONGE 4X4 12PLY STRL (GAUZE/BANDAGES/DRESSINGS) ×6 IMPLANT
GAUZE SPONGE 4X4 12PLY STRL LF (GAUZE/BANDAGES/DRESSINGS) ×12 IMPLANT
GEL ULTRASOUND 20GR AQUASONIC (MISCELLANEOUS) ×6 IMPLANT
GLOVE SURG NEOP MICRO LF SZ7.5 (GLOVE) ×18 IMPLANT
GOWN STRL REUS W/ TWL LRG LVL3 (GOWN DISPOSABLE) ×16 IMPLANT
GOWN STRL REUS W/TWL LRG LVL3 (GOWN DISPOSABLE) ×8
HEMOSTAT POWDER SURGIFOAM 1G (HEMOSTASIS) ×12 IMPLANT
INSERT FOGARTY XLG (MISCELLANEOUS) ×6 IMPLANT
INSERT SUTURE HOLDER (MISCELLANEOUS) ×6 IMPLANT
KIT APPLICATOR RATIO 11:1 (KITS) ×6 IMPLANT
KIT BASIN OR (CUSTOM PROCEDURE TRAY) ×6 IMPLANT
KIT SUCTION CATH 14FR (SUCTIONS) ×6 IMPLANT
KIT TURNOVER KIT B (KITS) ×6 IMPLANT
KIT VASOVIEW HEMOPRO 2 VH 4000 (KITS) ×6 IMPLANT
MARKER GRAFT CORONARY BYPASS (MISCELLANEOUS) IMPLANT
NEEDLE 18GX1X1/2 (RX/OR ONLY) (NEEDLE) ×6 IMPLANT
NS IRRIG 1000ML POUR BTL (IV SOLUTION) ×30 IMPLANT
PACK E OPEN HEART (SUTURE) ×6 IMPLANT
PACK OPEN HEART (CUSTOM PROCEDURE TRAY) ×6 IMPLANT
PACK PLATELET PROCEDURE 60 (MISCELLANEOUS) ×6 IMPLANT
PACK SPY-PHI (KITS) ×6 IMPLANT
PAD ARMBOARD 7.5X6 YLW CONV (MISCELLANEOUS) ×12 IMPLANT
PAD ELECT DEFIB RADIOL ZOLL (MISCELLANEOUS) ×6 IMPLANT
PENCIL BUTTON HOLSTER BLD 10FT (ELECTRODE) ×6 IMPLANT
POSITIONER HEAD DONUT 9IN (MISCELLANEOUS) ×6 IMPLANT
POWDER SURGICEL 3.0 GRAM (HEMOSTASIS) ×6 IMPLANT
PUNCH AORTIC ROTATE 4.5MM 8IN (MISCELLANEOUS) ×6 IMPLANT
SEALANT SURG COSEAL 4ML (VASCULAR PRODUCTS) IMPLANT
SEALANT SURG COSEAL 8ML (VASCULAR PRODUCTS) ×6 IMPLANT
SET MPS 3-ND DEL (MISCELLANEOUS) ×6 IMPLANT
SHEARS HARMONIC 9CM CVD (BLADE) ×6 IMPLANT
SPONGE T-LAP 18X18 ~~LOC~~+RFID (SPONGE) ×30 IMPLANT
SPONGE T-LAP 4X18 ~~LOC~~+RFID (SPONGE) ×6 IMPLANT
SUT BONE WAX W31G (SUTURE) ×6 IMPLANT
SUT MNCRL AB 3-0 PS2 18 (SUTURE) ×18 IMPLANT
SUT MNCRL AB 4-0 PS2 18 (SUTURE) ×6 IMPLANT
SUT PDS AB 1 CTX 36 (SUTURE) ×12 IMPLANT
SUT PROLENE 3 0 SH DA (SUTURE) ×6 IMPLANT
SUT PROLENE 5 0 C 1 36 (SUTURE) IMPLANT
SUT PROLENE 6 0 C 1 30 (SUTURE) ×24 IMPLANT
SUT PROLENE 7 0 BV 1 (SUTURE) ×18 IMPLANT
SUT PROLENE 8 0 BV175 6 (SUTURE) IMPLANT
SUT PROLENE BLUE 7 0 (SUTURE) ×6 IMPLANT
SUT SILK 2 0 SH CR/8 (SUTURE) IMPLANT
SUT SILK 3 0 SH CR/8 (SUTURE) IMPLANT
SUT STEEL 6MS V (SUTURE) ×6 IMPLANT
SUT STEEL SZ 6 DBL 3X14 BALL (SUTURE) ×6 IMPLANT
SUT VIC AB 2-0 CT1 27 (SUTURE) ×4
SUT VIC AB 2-0 CT1 TAPERPNT 27 (SUTURE) ×8 IMPLANT
SUT VIC AB 2-0 CTX 27 (SUTURE) IMPLANT
SUT VIC AB 3-0 SH 27 (SUTURE)
SUT VIC AB 3-0 SH 27X BRD (SUTURE) IMPLANT
SUT VIC AB 3-0 X1 27 (SUTURE) IMPLANT
SYR 10ML LL (SYRINGE) IMPLANT
SYR 30ML LL (SYRINGE) ×6 IMPLANT
SYR 3ML LL SCALE MARK (SYRINGE) ×6 IMPLANT
SYR 50ML SLIP (SYRINGE) IMPLANT
SYSTEM SAHARA CHEST DRAIN ATS (WOUND CARE) ×6 IMPLANT
TAPE CLOTH SURG 4X10 WHT LF (GAUZE/BANDAGES/DRESSINGS) ×6 IMPLANT
TAPE PAPER 2X10 WHT MICROPORE (GAUZE/BANDAGES/DRESSINGS) ×6 IMPLANT
TIP DUAL SPRAY TOPICAL (TIP) ×12 IMPLANT
TOWEL GREEN STERILE (TOWEL DISPOSABLE) ×6 IMPLANT
TOWEL GREEN STERILE FF (TOWEL DISPOSABLE) ×6 IMPLANT
TRAY FOL W/BAG SLVR 16FR STRL (SET/KITS/TRAYS/PACK) ×4 IMPLANT
TRAY FOLEY SLVR 16FR TEMP STAT (SET/KITS/TRAYS/PACK) IMPLANT
TRAY FOLEY W/BAG SLVR 16FR LF (SET/KITS/TRAYS/PACK) ×2
TUBING LAP HI FLOW INSUFFLATIO (TUBING) ×6 IMPLANT
UNDERPAD 30X36 HEAVY ABSORB (UNDERPADS AND DIAPERS) ×6 IMPLANT
WATER STERILE IRR 1000ML POUR (IV SOLUTION) ×12 IMPLANT
WATER STERILE IRR 1000ML UROMA (IV SOLUTION) IMPLANT

## 2020-11-20 NOTE — Anesthesia Procedure Notes (Signed)
Arterial Line Insertion Start/End7/04/2021 7:10 AM, 11/20/2020 7:10 AM Performed by: Kathryne Hitch, CRNA, CRNA  Patient location: Pre-op. Preanesthetic checklist: patient identified, IV checked, site marked, risks and benefits discussed, surgical consent, monitors and equipment checked, pre-op evaluation, timeout performed and anesthesia consent Lidocaine 1% used for infiltration Right, radial was placed Catheter size: 20 Fr Hand hygiene performed  and maximum sterile barriers used   Attempts: 1 Procedure performed without using ultrasound guided technique. Following insertion, dressing applied and Biopatch. Post procedure assessment: normal and unchanged  Patient tolerated the procedure well with no immediate complications.

## 2020-11-20 NOTE — Anesthesia Preprocedure Evaluation (Addendum)
Anesthesia Evaluation  Patient identified by MRN, date of birth, ID band Patient awake    Reviewed: Allergy & Precautions, NPO status , Patient's Chart, lab work & pertinent test results, reviewed documented beta blocker date and time   Airway Mallampati: II  TM Distance: >3 FB Neck ROM: Full    Dental  (+) Teeth Intact, Dental Advisory Given, Chipped,    Pulmonary asthma , sleep apnea and Continuous Positive Airway Pressure Ventilation , former smoker,    Pulmonary exam normal breath sounds clear to auscultation       Cardiovascular hypertension, Pt. on medications and Pt. on home beta blockers + angina + CAD and + Past MI  Normal cardiovascular exam Rhythm:Regular Rate:Normal  Echo 11/15/20: 1. Left ventricular ejection fraction, by estimation, is 30 to 35%. The  left ventricle has moderately decreased function. The left ventricle  demonstrates regional wall motion abnormalities with mid to apical  anteroseptal and inferoseptal akinesis.  Akinesis of the apical anterior, apical lateral, and apical inferior walls  as well as the true apex. Left ventricular diastolic parameters are  consistent with Grade I diastolic dysfunction (impaired relaxation).  2. Right ventricular systolic function is normal. The right ventricular  size is normal. Tricuspid regurgitation signal is inadequate for assessing  PA pressure.  3. Left atrial size was mildly dilated.  4. The mitral valve is normal in structure. Trivial mitral valve  regurgitation. No evidence of mitral stenosis.  5. The aortic valve is tricuspid. Aortic valve regurgitation is not  visualized. Mild aortic valve sclerosis is present, with no evidence of  aortic valve stenosis.  6. The inferior vena cava is normal in size with greater than 50%  respiratory variability, suggesting right atrial pressure of 3 mmHg.    Neuro/Psych negative neurological ROS  negative psych ROS    GI/Hepatic negative GI ROS, Neg liver ROS,   Endo/Other  diabetes, Type 2, Oral Hypoglycemic Agents  Renal/GU Renal disease     Musculoskeletal  (+) Arthritis ,   Abdominal   Peds  Hematology  (+) Blood dyscrasia ([place]), ,   Anesthesia Other Findings Day of surgery medications reviewed with the patient.  Reproductive/Obstetrics                            Anesthesia Physical Anesthesia Plan  ASA: 4  Anesthesia Plan: General   Post-op Pain Management:    Induction: Intravenous  PONV Risk Score and Plan: 2 and Midazolam  Airway Management Planned: Oral ETT  Additional Equipment: Arterial line, CVP, PA Cath, TEE and Ultrasound Guidance Line Placement  Intra-op Plan:   Post-operative Plan: Post-operative intubation/ventilation  Informed Consent: I have reviewed the patients History and Physical, chart, labs and discussed the procedure including the risks, benefits and alternatives for the proposed anesthesia with the patient or authorized representative who has indicated his/her understanding and acceptance.     Dental advisory given  Plan Discussed with: CRNA  Anesthesia Plan Comments:         Anesthesia Quick Evaluation

## 2020-11-20 NOTE — Op Note (Signed)
CARDIOTHORACIC SURGERY OPERATIVE NOTE  Date of Procedure: 11/20/2020  Preoperative Diagnosis: Severe 3-vessel Coronary Artery Disease  Postoperative Diagnosis: Same  Procedure:   Coronary Artery Bypass Grafting x 5  Left Internal Mammary Artery to Distal Left Anterior Descending Coronary Artery, Saphenous Vein Graft to Posterior Descending Coronary Artery, Saphenous Vein Graft to first and second obtuse Marginal Branches of Left Circumflex Coronary Artery, Saphenous Vein Graft to first diagonal Branch Coronary Artery, Endoscopic Vein Harvest from left thigh and Lower Leg Completion graft surveillance with indocyanine green fluorescence angiography  Surgeon: B.  Murvin Natal, MD  Assistant: Josie Saunders, PA-C  Anesthesia: General endotracheal  Operative Findings: Mildly reduced left ventricular systolic function Good quality left internal mammary artery conduit Good quality saphenous vein conduit Good quality target vessels for grafting    BRIEF CLINICAL NOTE AND INDICATIONS FOR SURGERY  70 year old man with 20+ year history of heart disease presented with 3 weeks of worsening chest pain and shortness of breath.  Given his past medical history underwent left heart catheterization which demonstrated severe multivessel disease.  He is referred to [facility] from [place] for CABG.  He has been stable as an inpatient.  He is now taken to surgery having been thoroughly evaluated and is considered a good candidate for the procedure    DETAILS OF THE OPERATIVE PROCEDURE  Preparation:  The patient is brought to the operating room on the above mentioned date and central monitoring was established by the anesthesia team including placement of Swan-Ganz catheter and radial arterial line. The patient is placed in the supine position on the operating table.  Intravenous antibiotics are administered. General endotracheal anesthesia is induced uneventfully. A Foley catheter is  placed.  Baseline transesophageal echocardiogram was performed.  Findings were notable for EF of approximately 45 to 50% with intact valve function  The patient's chest, abdomen, both groins, and both lower extremities are prepared and draped in a sterile manner. A time out procedure is performed.   Surgical Approach and Conduit Harvest:  A median sternotomy incision was performed and the left internal mammary artery is dissected from the chest wall and prepared for bypass grafting. The left internal mammary artery is notably good quality conduit. Simultaneously, the greater saphenous vein is obtained from the patient's left thigh using endoscopic vein harvest technique. The saphenous vein is notably good quality conduit. After removal of the saphenous vein, the small surgical incisions in the lower extremity are closed with absorbable suture. Following systemic heparinization, the left internal mammary artery was transected distally noted to have excellent flow.   Extracorporeal Cardiopulmonary Bypass and Myocardial Protection:  The pericardium is opened. The ascending aorta is nondiseased in appearance. The ascending aorta and the right atrium are cannulated for cardiopulmonary bypass.  Adequate heparinization is verified.    The entire pre-bypass portion of the operation was notable for stable hemodynamics.  Cardiopulmonary bypass was begun and the surface of the heart is inspected. Distal target vessels are selected for coronary artery bypass grafting. A cardioplegia cannula is placed in the ascending aorta.  The patient is allowed to cool passively to 34C systemic temperature.  The aortic cross clamp is applied and cold blood cardioplegia is delivered initially in an antegrade fashion through the aortic root. The initial cardioplegic arrest is rapid with early diastolic arrest.  Repeat doses of cardioplegia are administered intermittently throughout the entire cross clamp portion of the  operation through the aortic root and through subsequently placed vein grafts in order to maintain  completely flat electrocardiogram.   Coronary Artery Bypass Grafting:  The  posterior descending branch of the right coronary artery was grafted using a reversed saphenous vein graft in an end-to-side fashion.  At the site of distal anastomosis the target vessel was good quality and measured approximately 1.5 mm in diameter. The first and second obtuse marginal branches of the left circumflex coronary artery were grafted using a reversed saphenous vein graft in a sequenced fashion.  At the site of distal anastomoses the target vessel were good quality and measured approximately 1.5 mm in diameter. The first diagonal branch of the left anterior descending coronary artery was grafted using a reversed saphenous vein graft in an end-to-side fashion.  At the site of distal anastomosis the target vessel was good quality and measured approximately 1.5 mm in diameter. The distal left anterior coronary artery was grafted with the left internal mammary artery in an end-to-side fashion.  At the site of distal anastomosis the target vessel was good quality and measured approximately 1.5 mm in diameter. Anastomotic patency and runoff was confirmed with indocyanine green fluorescence imaging (SPY).   All proximal vein graft anastomoses were placed directly to the ascending aorta prior to removal of the aortic cross clamp.  A reanimation dose of cardioplegia was given down the aortic root, and after de-airing procedures were performed the aortic cross-clamp was removed.   Procedure Completion:  All proximal and distal coronary anastomoses were inspected for hemostasis and appropriate graft orientation. Epicardial pacing wires are fixed to the right ventricular outflow tract and to the right atrial appendage. The patient is rewarmed to 37C temperature. The patient is weaned and disconnected from cardiopulmonary bypass.   The patient's rhythm at separation from bypass was normal sinus.  The patient was weaned from cardiopulmonary bypass without any inotropic support.   Followup transesophageal echocardiogram performed after separation from bypass revealed  no changes from the preoperative exam.  The aortic and venous cannula were removed uneventfully. Protamine was administered to reverse the anticoagulation. The mediastinum and pleural space were inspected for hemostasis and irrigated with saline solution. The mediastinum and left pleural space were drained using fluted chest tubes placed through separate stab incisions inferiorly.  The soft tissues anterior to the aorta were reapproximated loosely. The sternum is closed with double strength sternal wire. The soft tissues anterior to the sternum were closed in multiple layers and the skin is closed with a running subcuticular skin closure.  The post-bypass portion of the operation was notable for stable rhythm and hemodynamics.  No blood products were administered during the operation.   Disposition:  The patient tolerated the procedure well and is transported to the surgical intensive care in stable condition. There are no intraoperative complications. All sponge instrument and needle counts are verified correct at completion of the operation.    Jayme Cloud, MD 11/20/2020 12:31 PM

## 2020-11-20 NOTE — Progress Notes (Addendum)
Echocardiogram Echocardiogram Transesophageal has been performed.  Oneal Deputy Mercades Bajaj RDCS 11/20/2020, 9:01 AM

## 2020-11-20 NOTE — Progress Notes (Signed)
Patient did not tolerate 2nd phase of cardiac wean - PS 10, CPAP 5.  Low RR and MVe alarms. Patient placed back on SIMV/PRVC/PS settings: VT 540  RR 12 FIO2 50% Peep 5 PS 10  Patient's ETT was re-secured with a commercial tube holder due to the pink tape not staying secure on the right side.

## 2020-11-20 NOTE — Anesthesia Procedure Notes (Signed)
Central Venous Catheter Insertion Performed by: Catalina Gravel, MD, anesthesiologist Start/End7/04/2021 6:55 AM, 11/20/2020 7:05 AM Patient location: Pre-op. Preanesthetic checklist: patient identified, IV checked, site marked, risks and benefits discussed, surgical consent, monitors and equipment checked, pre-op evaluation, timeout performed and anesthesia consent Position: Trendelenburg Lidocaine 1% used for infiltration and patient sedated Hand hygiene performed , maximum sterile barriers used  and Seldinger technique used Catheter size: 9 Fr Central line was placed.MAC introducer Procedure performed using ultrasound guided technique. Ultrasound Notes:anatomy identified, needle tip was noted to be adjacent to the nerve/plexus identified, no ultrasound evidence of intravascular and/or intraneural injection and image(s) printed for medical record Attempts: 1 Following insertion, line sutured, dressing applied and Biopatch. Post procedure assessment: free fluid flow, blood return through all ports and no air  Patient tolerated the procedure well with no immediate complications.

## 2020-11-20 NOTE — Progress Notes (Signed)
[facility] BRIEF SICU PROGRESS NOTE  Day of Surgery  S/P Procedure(s) (LRB): CORONARY ARTERY BYPASS GRAFTING (CABG) x  FIVE ON PUMP  USING LEFT INTERNAL MAMMARY ARTERTY AND LEFT ENDOSCOPIC GREATER SAPHENOUS VEIN CONDUITS, RIGHT LEG OPENED NOT HARVESTED (N/A) TRANSESOPHAGEAL ECHOCARDIOGRAM (TEE) (N/A) APPLICATION OF CELL SAVER   Waking up on vent NSR w/ stable hemodynamics on Epi and Neo drips O2 sats 100% on 50% FiO2 Chest tube output low UOP > 150 mL/hr Labs okay  Plan: Continue routine early postop  Rexene Alberts, MD 11/20/2020 5:23 PM

## 2020-11-20 NOTE — Anesthesia Procedure Notes (Signed)
Procedure Name: Intubation Date/Time: 11/20/2020 8:38 AM Performed by: Kathryne Hitch, CRNA Pre-anesthesia Checklist: Patient identified, Emergency Drugs available, Suction available and Patient being monitored Patient Re-evaluated:Patient Re-evaluated prior to induction Oxygen Delivery Method: Circle system utilized Preoxygenation: Pre-oxygenation with 100% oxygen Induction Type: IV induction Ventilation: Mask ventilation without difficulty Laryngoscope Size: Miller and 3 Grade View: Grade I Tube type: Oral Tube size: 8.0 mm Number of attempts: 1 Airway Equipment and Method: Stylet and Oral airway Placement Confirmation: ETT inserted through vocal cords under direct vision, positive ETCO2 and breath sounds checked- equal and bilateral Secured at: 23 cm Tube secured with: Tape Dental Injury: Teeth and Oropharynx as per pre-operative assessment

## 2020-11-20 NOTE — Transfer of Care (Signed)
Immediate Anesthesia Transfer of Care Note  Patient: [name]  Procedure(s) Performed: CORONARY ARTERY BYPASS GRAFTING (CABG) x  FIVE ON PUMP  USING LEFT INTERNAL MAMMARY ARTERTY AND LEFT ENDOSCOPIC GREATER SAPHENOUS VEIN CONDUITS, RIGHT LEG OPENED NOT HARVESTED (Chest) TRANSESOPHAGEAL ECHOCARDIOGRAM (TEE) APPLICATION OF CELL SAVER  Patient Location: ICU  Anesthesia Type:GA combined with regional for post-op pain  Level of Consciousness: Patient remains intubated per anesthesia plan  Airway & Oxygen Therapy: Patient remains intubated per anesthesia plan and Patient placed on Ventilator (see vital sign flow sheet for setting)  Post-op Assessment: Report given to RN and Post -op Vital signs reviewed and stable  Post vital signs: Reviewed and stable  Last Vitals:  Vitals Value Taken Time  BP 94/67 11/20/20 1237  Temp 35.6 C 11/20/20 1241  Pulse 86 11/20/20 1241  Resp 16 11/20/20 1241  SpO2 98 % 11/20/20 1241  Vitals shown include unvalidated device data.  Last Pain:  Vitals:   11/20/20 0404  TempSrc: Oral  PainSc:       Patients Stated Pain Goal: 0 (99/35/70 1779)  Complications: No notable events documented.

## 2020-11-20 NOTE — Hospital Course (Addendum)
HPI: 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  He also has a history of hypertension, dyslipidemia, and type 2 diabetes mellitus.  He is a former smoker having quit in 2009.  He is status post multiple percutaneous coronary interventions with several stents being placed to the left anterior descending and diagonal coronary arteries.  He has been followed by cardiology service.  He presented to Dr. Esmond Plants in [place] about 10 days ago with a 3-week history of shortness of breath and exertional chest pain relieved with nitroglycerin.  The patient expressed concern that this was very similar to angina he had in the past and thought he should have a left heart catheterization.  EKG showed normal sinus rhythm with no ST or T wave changes.  Since there was a high likelihood for coronary intervention being necessary, he was scheduled for elective left heart catheterization here in [place].  This was performed earlier today by Dr. Saunders Revel.  This demonstrated an ejection fraction of 25 to 35% with LVEDP elevated at 20.  There was global LV hypokinesis.  Coronary angiography revealed an 80 to 90% ostial LAD stenosis that appears to be at the proximal end of the stent.  Additionally, there was an 80% stenosis in the distal LAD there is a patent stent in the first diagonal.  The left circumflex has a long 70% mid stenosis.  There was nonobstructive disease in the distal right coronary artery. CT surgery has been asked to evaluate Mr. [name] for consideration of surgical coronary revascularization. Currently, Mr. [name] is resting in bed in the Cath Lab recovery area.  He denies having any pain or shortness of breath.  Having dealt with his coronary disease for a number of years, he relates that he is not at all surprised that he would come to the point of needing coronary bypass grafting and says he has "come to terms  with it". 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 Mr. [name] admits his teeth are in poor repair.  He does not have any that are painful to him currently but he has not had any dental care in several years since dental insurance was dropped from his retirement benefits. He has been on Effient for his multiple coronary stents.  His last dose was 11/15/2020. Dr. Orvan Seen discussed the need for coronary artery bypass grafting surgery. Potential risks, benefits, and complications of the surgery were discussed with the patient and he agreed to proceed with surgery.  Pre operative carotid duplex US showed no significant internal carotid artery stenosis bilaterally. He underwent a CABG x 5 by Dr. Orvan Seen on 11/20/2020.  Hospital Course: Updated dated in DCP

## 2020-11-20 NOTE — Discharge Instructions (Signed)

## 2020-11-20 NOTE — H&P (Signed)
History and Physical Interval Note:  11/20/2020 7:20 AM  Leone Payor  has presented today for surgery, with the diagnosis of CAD, CHF.  The various methods of treatment have been discussed with the patient and family. After consideration of risks, benefits and other options for treatment, the patient has consented to  Procedure(s): CORONARY ARTERY BYPASS GRAFTING (CABG) (N/A) POSSIBLE RADIAL ARTERY HARVEST (Left) TRANSESOPHAGEAL ECHOCARDIOGRAM (TEE) (N/A) as a surgical intervention.  The patient's history has been reviewed, patient examined, no change in status, stable for surgery.  I have reviewed the patient's chart and labs.  Questions were answered to the patient's satisfaction.     Martino Tompson Z Daemien Fronczak  Will not perform left radial artery harvesting due to inadequate palm perfusion during modified Allen's testing with plethysmography. Floy Angert Z. Orvan Seen, [place]

## 2020-11-20 NOTE — Anesthesia Procedure Notes (Signed)
Central Venous Catheter Insertion Performed by: Catalina Gravel, MD, anesthesiologist Start/End7/04/2021 7:05 AM, 11/20/2020 7:10 AM Patient location: Pre-op. Preanesthetic checklist: patient identified, IV checked, site marked, risks and benefits discussed, surgical consent, monitors and equipment checked, pre-op evaluation and timeout performed Position: Trendelenburg Hand hygiene performed  and maximum sterile barriers used  Total catheter length 100. PA cath was placed.Swan type:thermodilution PA Cath depth:48 Procedure performed without using ultrasound guided technique. Attempts: 1 Patient tolerated the procedure well with no immediate complications.

## 2020-11-20 NOTE — Brief Op Note (Signed)
11/15/2020 - 11/20/2020  9:45 AM  PATIENT:  Leone Payor  70 y.o. male  PRE-OPERATIVE DIAGNOSIS:  CORONARY ARTERY DISEASE  POST-OPERATIVE DIAGNOSIS:  CORONARY ARTERY DISEASE  PROCEDURE:  TRANSESOPHAGEAL ECHOCARDIOGRAM (TEE),CORONARY ARTERY BYPASS GRAFTING (CABG) x  5 (LIMA to LAD, SVG to DIAGONAL, SVG SEQUENTIALLY to OM1 and OM2, SVG to PDA)  USING LEFT INTERNAL MAMMARY ARTERTY AND LEFT ENDOSCOPIC GREATER SAPHENOUS VEIN CONDUITS, RIGHT LEG OPENED NOT HARVESTED, APPLICATION OF CELL SAVER  EVH HARVEST TIME: 21 minutes: EVH PREP TIME: 15 minutes  SURGEON:  Surgeon(s) and Role:    Wonda Olds, MD - Primary  PHYSICIAN ASSISTANT: Lars Pinks PA-C  ASSISTANTS: Despina Arias RNFA  ANESTHESIA:   general  EBL: Per anesthesia, perfusion record  DRAINS:  Chest tubes placed in the mediastinal and pleural spaces    COUNTS CORRECT:  YES  DICTATION: .Dragon Dictation  PLAN OF CARE: Admit to inpatient   PATIENT DISPOSITION:  ICU - intubated and hemodynamically stable.   Delay start of Pharmacological VTE agent (>24hrs) due to surgical blood loss or risk of bleeding: yes  BASELINE WEIGHT: 85 kg

## 2020-11-20 NOTE — Procedures (Signed)
Extubation Procedure Note  Patient Details:   Name: Coady Train DOB: 04/08/1951 MRN: [id number]   Airway Documentation:    Vent end date: 11/20/20 Vent end time: 1843   Evaluation  O2 sats: stable throughout Complications: No apparent complications Patient did tolerate procedure well. Bilateral Breath Sounds: Clear, Diminished   Yes  Positive cuff leak noted, NIF - 24, VC 2.9L. Patient placed on [place] 4L with humidity, no stridor noted. Patient able to reach 1573mL using the incentive spirometer.  Bayard Beaver 11/20/2020, 6:49 PM

## 2020-11-20 NOTE — Progress Notes (Signed)
Placed patient on home CPAP unit. Filled chamber with distilled H20 per pt request. 3L O2 bleed in.

## 2020-11-21 ENCOUNTER — Encounter (HOSPITAL_COMMUNITY): Payer: Self-pay | Admitting: Cardiothoracic Surgery

## 2020-11-21 ENCOUNTER — Inpatient Hospital Stay (HOSPITAL_COMMUNITY): Payer: Medicare Other

## 2020-11-21 LAB — BASIC METABOLIC PANEL
Anion gap: 7 (ref 5–15)
Anion gap: 7 (ref 5–15)
BUN: 14 mg/dL (ref 8–23)
BUN: 15 mg/dL (ref 8–23)
CO2: 22 mmol/L (ref 22–32)
CO2: 25 mmol/L (ref 22–32)
Calcium: 8 mg/dL — ABNORMAL LOW (ref 8.9–10.3)
Calcium: 8.5 mg/dL — ABNORMAL LOW (ref 8.9–10.3)
Chloride: 109 mmol/L (ref 98–111)
Chloride: 107 mmol/L (ref 98–111)
Creatinine, Ser: 1.02 mg/dL (ref 0.61–1.24)
Creatinine, Ser: 0.97 mg/dL (ref 0.61–1.24)
GFR, Estimated: >60 mL/min (ref >60)
GFR, Estimated: >60 mL/min (ref >60)
Glucose, Bld: 126 mg/dL — ABNORMAL HIGH (ref 70–99)
Glucose, Bld: 105 mg/dL — ABNORMAL HIGH (ref 70–99)
Potassium: 3.9 mmol/L (ref 3.5–5.1)
Potassium: 4.3 mmol/L (ref 3.5–5.1)
Sodium: 138 mmol/L (ref 135–145)
Sodium: 139 mmol/L (ref 135–145)

## 2020-11-21 LAB — MAGNESIUM
Magnesium: 2.7 mg/dL — ABNORMAL HIGH (ref 1.7–2.4)
Magnesium: 2.6 mg/dL — ABNORMAL HIGH (ref 1.7–2.4)

## 2020-11-21 LAB — CBC
HCT: 37.1 % — ABNORMAL LOW (ref 39.0–52.0)
HCT: 39.1 % (ref 39.0–52.0)
Hemoglobin: 12.4 g/dL — ABNORMAL LOW (ref 13.0–17.0)
Hemoglobin: 13.3 g/dL (ref 13.0–17.0)
MCH: 32.1 pg (ref 26.0–34.0)
MCH: 33 pg (ref 26.0–34.0)
MCHC: 33.4 g/dL (ref 30.0–36.0)
MCHC: 34 g/dL (ref 30.0–36.0)
MCV: 96.1 fL (ref 80.0–100.0)
MCV: 97 fL (ref 80.0–100.0)
Platelets: 161 10*3/uL (ref 150–400)
Platelets: 131 10*3/uL — ABNORMAL LOW (ref 150–400)
RBC: 3.86 MIL/uL — ABNORMAL LOW (ref 4.22–5.81)
RBC: 4.03 MIL/uL — ABNORMAL LOW (ref 4.22–5.81)
RDW: 12.6 % (ref 11.5–15.5)
RDW: 12.8 % (ref 11.5–15.5)
WBC: 12.5 10*3/uL — ABNORMAL HIGH (ref 4.0–10.5)
WBC: 11 10*3/uL — ABNORMAL HIGH (ref 4.0–10.5)
nRBC: 0 % (ref 0.0–0.2)
nRBC: 0 % (ref 0.0–0.2)

## 2020-11-21 LAB — GLUCOSE, CAPILLARY
Glucose-Capillary: 106 mg/dL — ABNORMAL HIGH (ref 70–99)
Glucose-Capillary: 109 mg/dL — ABNORMAL HIGH (ref 70–99)
Glucose-Capillary: 111 mg/dL — ABNORMAL HIGH (ref 70–99)
Glucose-Capillary: 114 mg/dL — ABNORMAL HIGH (ref 70–99)
Glucose-Capillary: 117 mg/dL — ABNORMAL HIGH (ref 70–99)
Glucose-Capillary: 121 mg/dL — ABNORMAL HIGH (ref 70–99)
Glucose-Capillary: 123 mg/dL — ABNORMAL HIGH (ref 70–99)
Glucose-Capillary: 124 mg/dL — ABNORMAL HIGH (ref 70–99)
Glucose-Capillary: 129 mg/dL — ABNORMAL HIGH (ref 70–99)
Glucose-Capillary: 130 mg/dL — ABNORMAL HIGH (ref 70–99)
Glucose-Capillary: 137 mg/dL — ABNORMAL HIGH (ref 70–99)
Glucose-Capillary: 141 mg/dL — ABNORMAL HIGH (ref 70–99)
Glucose-Capillary: 143 mg/dL — ABNORMAL HIGH (ref 70–99)
Glucose-Capillary: 163 mg/dL — ABNORMAL HIGH (ref 70–99)

## 2020-11-21 LAB — ECHO INTRAOPERATIVE TEE
Height: 68 in
Weight: 2984 oz

## 2020-11-21 MED ORDER — POTASSIUM CHLORIDE CRYS ER 20 MEQ PO TBCR
30.0000 meq | EXTENDED_RELEASE_TABLET | Freq: Once | ORAL | Status: AC
  Administered 2020-11-21: 30 meq via ORAL
  Filled 2020-11-21: qty 1

## 2020-11-21 MED ORDER — SODIUM CHLORIDE 0.9% FLUSH
10.0000 mL | INTRAVENOUS | Status: DC | PRN
Start: 2020-11-21 — End: 2020-11-24

## 2020-11-21 MED ORDER — INSULIN ASPART 100 UNIT/ML IJ SOLN
0.0000 [IU] | INTRAMUSCULAR | Status: DC
  Administered 2020-11-21: 4 [IU] via SUBCUTANEOUS
  Administered 2020-11-21 (×2): 2 [IU] via SUBCUTANEOUS
  Administered 2020-11-22: 12 [IU] via SUBCUTANEOUS
  Administered 2020-11-22: 8 [IU] via SUBCUTANEOUS
  Administered 2020-11-22: 4 [IU] via SUBCUTANEOUS
  Administered 2020-11-23: 8 [IU] via SUBCUTANEOUS

## 2020-11-21 MED ORDER — SODIUM CHLORIDE 0.9% FLUSH
10.0000 mL | Freq: Two times a day (BID) | INTRAVENOUS | Status: DC
Start: 2020-11-21 — End: 2020-11-22
  Administered 2020-11-21 – 2020-11-22 (×2): 10 mL

## 2020-11-21 MED ORDER — COLCHICINE 0.3 MG HALF TABLET: 0.3000 mg | ORAL_TABLET | Freq: Two times a day (BID) | ORAL | Status: DC

## 2020-11-21 MED ORDER — THIAMINE HCL 100 MG/ML IJ SOLN
Freq: Once | INTRAVENOUS | Status: AC
  Filled 2020-11-21: qty 1000

## 2020-11-21 MED ORDER — INSULIN DETEMIR 100 UNIT/ML ~~LOC~~ SOLN
12.0000 [IU] | Freq: Every day | SUBCUTANEOUS | Status: DC
  Administered 2020-11-22: 12 [IU] via SUBCUTANEOUS
  Filled 2020-11-21 (×2): qty 0.12

## 2020-11-21 MED ORDER — INSULIN DETEMIR 100 UNIT/ML ~~LOC~~ SOLN
10.0000 [IU] | Freq: Every day | SUBCUTANEOUS | Status: AC
  Administered 2020-11-21: 10 [IU] via SUBCUTANEOUS
  Filled 2020-11-21: qty 0.1

## 2020-11-21 MED ORDER — FUROSEMIDE 10 MG/ML IJ SOLN: 20.0000 mg | Freq: Once | INTRAMUSCULAR | Status: DC

## 2020-11-21 NOTE — Anesthesia Postprocedure Evaluation (Signed)
Anesthesia Post Note  Patient: Dillian Feig  Procedure(s) Performed: CORONARY ARTERY BYPASS GRAFTING (CABG) x  FIVE ON PUMP  USING LEFT INTERNAL MAMMARY ARTERTY AND LEFT ENDOSCOPIC GREATER SAPHENOUS VEIN CONDUITS, RIGHT LEG OPENED NOT HARVESTED (Chest) TRANSESOPHAGEAL ECHOCARDIOGRAM (TEE) APPLICATION OF CELL SAVER     Patient location during evaluation: SICU Anesthesia Type: General Level of consciousness: sedated Pain management: pain level controlled Vital Signs Assessment: post-procedure vital signs reviewed and stable Respiratory status: patient remains intubated per anesthesia plan Cardiovascular status: stable Postop Assessment: no apparent nausea or vomiting Anesthetic complications: no   No notable events documented.  Last Vitals:  Vitals:   11/21/20 1500 11/21/20 1600  BP: 122/68 140/74  Pulse: 82 83  Resp: 14 19  Temp: 37.2 C   SpO2: 96% 96%    Last Pain:  Vitals:   11/21/20 1500  TempSrc: Axillary  PainSc:                  Catalina Gravel

## 2020-11-21 NOTE — Progress Notes (Addendum)
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  USING LEFT INTERNAL MAMMARY ARTERTY AND LEFT ENDOSCOPIC GREATER SAPHENOUS VEIN CONDUITS, RIGHT LEG OPENED NOT HARVESTED (N/A) TRANSESOPHAGEAL ECHOCARDIOGRAM (TEE) (N/A) APPLICATION OF CELL SAVER  Total Length of Stay:  LOS: 5 days   Subjective: Patient awake and alert this am  Objective: Vital signs in last 24 hours: Temp:  [95.9 F (35.5 C)-98.96 F (37.2 C)] 98.24 F (36.8 C) (07/13 0700) Pulse Rate:  [74-104] 75 (07/13 0700) Cardiac Rhythm: Normal sinus rhythm (07/12 2000) Resp:  [11-23] 14 (07/13 0700) BP: (94-139)/(51-78) 108/60 (07/13 0700) SpO2:  [96 %-100 %] 96 % (07/13 0700) Arterial Line BP: (97-143)/(45-78) 115/45 (07/13 0700) FiO2 (%):  [40 %-50 %] 40 % (07/12 1750) Weight:  [87.7 kg] 87.7 kg (07/13 0609)  Filed Weights   11/15/20 0549 11/15/20 1519 11/21/20 0609  Weight: 86.2 kg 84.6 kg 87.7 kg    Weight change:    Hemodynamic parameters for last 24 hours: PAP: (19-37)/(8-17) 28/11 CO:  [3.8 L/min-6 L/min] 6 L/min CI:  [1.9 L/min/m2-3 L/min/m2] 3 L/min/m2  Intake/Output from previous day: 07/12 0701 - 07/13 0700 In: 6917.2 [I.V.:4470.2; Blood:400; IV [id number]] Out: 3760 [Urine:2610; Blood:600; Chest Tube:550]  Intake/Output this shift: No intake/output data recorded.  Current Meds: Scheduled Meds:  acetaminophen  1,000 mg Oral Q6H   Or   acetaminophen (TYLENOL) oral liquid 160 mg/5 mL  1,000 mg Per Tube Q6H   aspirin EC  325 mg Oral Daily   Or   aspirin  324 mg Per Tube Daily   atorvastatin  80 mg Oral Daily   bisacodyl  10 mg Oral Daily   Or   bisacodyl  10 mg Rectal Daily   Chlorhexidine Gluconate Cloth  6 each Topical Daily   colchicine  0.3 mg Oral BID   diazepam  2 mg Oral Q8H   docusate sodium  200 mg Oral  Daily   ketorolac  7.5 mg Intravenous Q6H   metoprolol tartrate  12.5 mg Oral BID   Or   metoprolol tartrate  12.5 mg Per Tube BID   [START ON 11/22/2020] pantoprazole  40 mg Oral Daily   sodium chloride flush  3 mL Intravenous Q12H   Continuous Infusions:  sodium chloride 10 mL/hr at 11/21/20 0700   sodium chloride     sodium chloride 10 mL/hr at 11/20/20 1309   albumin human 12.5 g (11/20/20 1310)    ceFAZolin (ANCEF) IV Stopped (11/21/20 0657)   dexmedetomidine (PRECEDEX) IV infusion Stopped (11/20/20 1507)   electrolyte-A 75 mL/hr at 11/21/20 0700   famotidine (PEPCID) IV Stopped (11/20/20 1326)   insulin 0.8 Units/hr (11/21/20 0700)   lactated ringers     lactated ringers     lactated ringers 20 mL/hr at 11/21/20 0700   nitroGLYCERIN Stopped (11/20/20 1348)   phenylephrine (NEO-SYNEPHRINE) Adult infusion Stopped (11/21/20 0604)   PRN Meds:.sodium chloride, albumin human, dextrose, lactated ringers, levalbuterol, metoprolol tartrate, midazolam, morphine injection, ondansetron (ZOFRAN) IV, oxyCODONE, sodium chloride flush, traMADol  General appearance: alert, cooperative, and no distress Neurologic: intact Heart: RRR Lungs: Slightly diminished bibasilar breath sounds Abdomen: Soft, non tender, sporadic bowel  sounds present Extremities: SCDs in place Wound: Aquacel intact on sternum. Bilateral LE dressings are clean and dry  Lab Results: CBC:   BMET:  Recent Labs    11/20/20 1822 11/20/20 1947 11/21/20 0410  NA 136 143 138  K 4.3 3.9 3.9  CL 106  --  109  CO2 17*  --  22  GLUCOSE 143*  --  126*  BUN 13  --  14  CREATININE 1.09  --  1.02  CALCIUM 8.3*  --  8.0*    CMET: Lab Results  Component Value Date   WBC 12.5 (H) 11/21/2020   HGB 12.4 (L) 11/21/2020   HCT 37.1 (L) 11/21/2020   PLT 161 11/21/2020   GLUCOSE 126 (H) 11/21/2020   CHOL 194 11/17/2020   TRIG 163 (H) 11/17/2020   HDL 40 (L) 11/17/2020   LDLDIRECT 71.0 07/11/2019   LDLCALC 121 (H)  11/17/2020   ALT 67 (H) 11/20/2020   AST 58 (H) 11/20/2020   NA 138 11/21/2020   K 3.9 11/21/2020   CL 109 11/21/2020   CREATININE 1.02 11/21/2020   BUN 14 11/21/2020   CO2 22 11/21/2020   TSH 0.919 11/22/2014   PSA 1.24 09/01/2013   INR 1.3 (H) 11/20/2020   HGBA1C 8.6 (H) 11/15/2020    PT/INR:  Recent Labs    11/20/20 1249  LABPROT 15.7*  INR 1.3*   Radiology: DG Chest Port 1 View  Result Date: 11/20/2020 CLINICAL DATA:  Post CABG EXAM: PORTABLE CHEST 1 VIEW COMPARISON:  Portable exam 1245 hours compared to 08/14/2017 FINDINGS: Tip of endotracheal tube projects 5.4 cm above carina. Nasogastric tube extends into stomach. Mediastinal drains and LEFT thoracostomy tube present. Tip of RIGHT jugular Swan-Ganz catheter projects over bifurcation of main pulmonary artery. Normal heart size post interval median sternotomy and prior coronary stenting. Bibasilar atelectasis. Upper lungs clear. No pleural effusion or pneumothorax. IMPRESSION: Postoperative changes with bibasilar atelectasis. Line and tube positions as above. Electronically Signed   By: Lavonia Dana M.D.   On: 11/20/2020 13:16     Assessment/Plan: S/P Procedure(s) (LRB): CORONARY ARTERY BYPASS GRAFTING (CABG) x  FIVE ON PUMP  USING LEFT INTERNAL MAMMARY ARTERTY AND LEFT ENDOSCOPIC GREATER SAPHENOUS VEIN CONDUITS, RIGHT LEG OPENED NOT HARVESTED (N/A) TRANSESOPHAGEAL ECHOCARDIOGRAM (TEE) (N/A) APPLICATION OF CELL SAVER CV-CO/CI 7.3/3.7. SR with HR in the 70-80's. On  low dose Epinephrine drip this am as well as Lopressor 12.5 mg bid. Wean off Epi. Pulmonary-History of OSA (on CPAP at night). On 4 liters of oxygen via Ali Molina. Wean as able over next few days. Chest tubes with 550 cc since surgery. CXR this am appears to show patient rotated to the right, bibasilar atelectasis. Encourage incentive spirometer. Volume overload Expected post op blood loss anemia-H and H this am stable at 12.4 and 37.1 5. DM-CBGs 129/123/124. Pre op HGA1C  8.6. Will restart Jardiance, Januvia, and Metformin closer to discharge. Wean off Insulin drip. 6. Supplement potassium 7. Remove a line and Rowe Pavy PA-C 11/21/2020 7:47 AM   Agree with assessment and plan.  Areon Cocuzza Z. Orvan Seen, [place]

## 2020-11-21 NOTE — Discharge Summary (Signed)
Physician Discharge Summary       [address]       [place] [address]             [phone]    Patient ID: [name] MRN: [id number] DOB/AGE: [birth year] 70 y.o.  Admit date: 11/15/2020 Discharge date: 11/24/2020  Admission Diagnoses: Unstable angina ([place]) 2. CAD (coronary artery disease)  Discharge Diagnoses:  S/P CABG x 5 Expected post op blood loss anemia History of Ischemic cardiomyopathy 4. History of Diabetes mellitus without complication ([place]) 5. History of HTN (hypertension) 6. History of Hyperlipemia 7. History of asthma 8. History of obesity 9. History of OSA (obstructive sleep apnea)-on CPAP History of osteoarthritis 10. History of BCC (basal cell carcinoma of skin) 11. History of Bursitis of left elbow  Consults: cardiology  Procedure (s):  Coronary Artery Bypass Grafting x 5             Left Internal Mammary Artery to Distal Left Anterior Descending Coronary Artery, Saphenous Vein Graft to Posterior Descending Coronary Artery, Saphenous Vein Graft to first and second obtuse Marginal Branches of Left Circumflex Coronary Artery, Saphenous Vein Graft to first diagonal Branch Coronary Artery, Endoscopic Vein Harvest from left thigh and Lower Leg Completion graft surveillance with indocyanine green fluorescence angiography by Dr. Orvan Seen on 11/20/2020.  History of Presenting Illness: [name] is a 70 year old male with a past history of coronary artery disease with his first heart attack in 18 and a recurrent MI 47.  He also has a history of hypertension, dyslipidemia, and type 2 diabetes mellitus.  He is a former smoker having quit in 2009.  He is status post multiple percutaneous coronary interventions with several stents being placed to the left anterior descending and diagonal coronary arteries.  He has been followed by cardiology service.  He presented to Dr. Esmond Plants in [place] about 10 days ago with a 3-week history of  shortness of breath and exertional chest pain relieved with nitroglycerin.  The patient expressed concern that this was very similar to angina he had in the past and thought he should have a left heart catheterization.  EKG showed normal sinus rhythm with no ST or T wave changes.  Since there was a high likelihood for coronary intervention being necessary, he was scheduled for elective left heart catheterization here in [place].  This was performed earlier today by Dr. Saunders Revel.  This demonstrated an ejection fraction of 25 to 35% with LVEDP elevated at 20.  There was global LV hypokinesis.  Coronary angiography revealed an 80 to 90% ostial LAD stenosis that appears to be at the proximal end of the stent.  Additionally, there was an 80% stenosis in the distal LAD there is a patent stent in the first diagonal.  The left circumflex has a long 70% mid stenosis.  There was nonobstructive disease in the distal right coronary artery. CT surgery has been asked to evaluate [name] for consideration of surgical coronary revascularization. Currently, [name] is resting in bed in the Cath Lab recovery area.  He denies having any pain or shortness of breath.  Having dealt with his coronary disease for a number of years, he relates that he is not at all surprised that he would come to the point of needing coronary bypass grafting and says he has "come to terms with it". 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 [name]  admits his teeth are in poor repair.  He does not have any that are painful to him currently but he has not had any dental care in several years since dental insurance was dropped from his retirement benefits. He has been on Effient for his multiple coronary stents.  His last dose was 11/15/2020. Dr. Orvan Seen discussed the need for coronary artery bypass grafting surgery. Potential risks, benefits, and complications of  the surgery were discussed with the patient and he agreed to proceed with surgery.  Pre operative carotid duplex US showed no significant internal carotid artery stenosis bilaterally. He underwent a CABG x 5 by Dr. Orvan Seen on 11/20/2020.  Brief Hospital Course:  Patient was transferred from the OR to [facility] in stable condition. Patient was extubated the evening of surgery. He was weaned off Epi, Nor Epinephrine, and Nitro drips. Gordy Councilman and a line were removed on post op day one. Chest tubes and foley remained a few days and then were removed. Patient was volume overloaded and diuresed accordingly.Patient was weaned off the Insulin drip. His pre op HGA1C is 8.6. He will be restarted on Jardiance, Januvia, and Metformin closer to discharge. He was felt surgically stable for transfer from the ICU to 4E for further convalescence on 07/14. Epicardial pacing wires were removed on 07/15. He has constipation and was given a laxative. He felt much better after having two bowel movements 07/14. He has been tolerating a diet. He was on several liters of oxygen via [place] but was later weaned to room air. All wounds are clean, dry, and healing without signs of infection. He was restarted on low dose Metformin on 07/15.  He will be restarted on Januvia and Jardiance at discharge. He will need close medical follow up after discharge as HGA1C 8. Also, he had a depressed LVEF prior to surgery (30-35%), so Lopressor was changed to Carvedilol. He is felt surgically stable for discharge today.   Latest Vital Signs: Blood pressure 124/76, pulse 80, temperature 98.6 F (37 C), temperature source Oral, resp. rate 17, height '5\' 8"'  (1.727 m), weight 86.8 kg, SpO2 96 %.  Physical Exam: General appearance: alert, cooperative, and no distress Heart: regular rate and rhythm Lungs: min dim in bases Abdomen: benign Extremities: + edema left>right LE Wound: incis healing well  Discharge Condition:Stable and discharged to  home.  Recent laboratory studies:  Lab Results  Component Value Date   WBC 11.5 (H) 11/23/2020   HGB 13.5 11/23/2020   HCT 39.3 11/23/2020   MCV 95.9 11/23/2020   PLT 140 (L) 11/23/2020   Lab Results  Component Value Date   NA 137 11/23/2020   K 3.9 11/23/2020   CL 105 11/23/2020   CO2 24 11/23/2020   CREATININE 0.83 11/23/2020   GLUCOSE 124 (H) 11/23/2020      Diagnostic Studies: DG Chest 2 View  Result Date: 11/23/2020 CLINICAL DATA:  Sore throat status post open heart surgery a few days ago. Pleural effusion. EXAM: CHEST - 2 VIEW COMPARISON:  November 22, 2020. FINDINGS: Prior median sternotomy. Coronary artery stent. Right IJ sheath remains in place. Similar small left pleural effusion with overlying streaky left basilar opacities. Clear right lung. No visible pneumothorax. IMPRESSION: Similar small left pleural effusion with overlying streaky left basilar opacities, most likely atelectasis in the postoperative setting. Electronically Signed   By: Margaretha Sheffield MD   On: 11/23/2020 09:07   DG Chest 2 View  Result Date: 11/22/2020 CLINICAL DATA:  Recent CABG EXAM: CHEST -  2 VIEW COMPARISON:  11/22/2020 at 0517 hours FINDINGS: Prior median sternotomy. Stable cardiomediastinal contours. Right IJ sheath remains in place. Improving aeration of the left lung base with minimal residual left basilar atelectasis and small effusion. No pneumothorax. IMPRESSION: Improving aeration of the left lung base. Electronically Signed   By: Davina Poke D.O.   On: 11/22/2020 12:32   CT CHEST WO CONTRAST  Result Date: 11/19/2020 CLINICAL DATA:  Preop planning for CABG. EXAM: CT CHEST WITHOUT CONTRAST TECHNIQUE: Multidetector CT imaging of the chest was performed following the standard protocol without IV contrast. COMPARISON:  None. FINDINGS: Cardiovascular: Ascending thoracic aorta measures 32 mm. Descending thoracic aorta measures 28 mm. Bovine arch anatomy. Dense three-vessel coronary artery  calcification. Mediastinum/Nodes: No axillary or supraclavicular adenopathy. No mediastinal or hilar adenopathy. No pericardial fluid. Esophagus normal. Lungs/Pleura: No suspicious pulmonary nodules. Normal pleural. Airways normal. Upper Abdomen: Low-density enlargement of the LEFT adrenal gland to 18 mm consistent benign adrenal adenoma. Musculoskeletal: No acute osseous abnormality. IMPRESSION: 1. Three-vessel coronary artery calcification. 2.  Aortic Atherosclerosis (ICD10-I70.0). Electronically Signed   By: Suzy Bouchard M.D.   On: 11/19/2020 09:44   CARDIAC CATHETERIZATION  Result Date: 11/15/2020 Conclusions: 1. Three-vessel coronary artery disease with 90% ostial/proximal LAD stenosis that appears to be within old stent though heavy calcification confounds visualization, long proximal through distal LCx disease of up to 70% that is hemodynamically significant (RFR = 0.76), and 50% distal RCA stenosis that is borderline significant (RFR = 0.90). 2. Severely reduced left ventricular systolic function (LVEF 81-85%) with mildly elevated filling pressure (LVEDP ~20 mmHg). Recommendations: 1. Admit for observation and cardiac surgery consultation for CABG, given worsening chest pain (including at rest prior to today's catheterization), reduced LVEF, and diabetes mellitus. 2. Obtain echocardiogram. 3. Hold prasugrel pending cardiac surgery consultation.  Start IV heparin 2 hours after TR band removal. Nelva Bush, MD [facility]   DG Chest Port 1 View  Result Date: 11/22/2020 CLINICAL DATA:  Recent CABG. EXAM: PORTABLE CHEST 1 VIEW COMPARISON:  Chest x-ray from yesterday. FINDINGS: Interval removal of the Swan-Ganz catheter. The right internal jugular sheath remains in place. Interval removal of the mediastinal drain and left chest tube. No pneumothorax. Small left pleural effusion with mild left basilar atelectasis. The right lung is clear. Stable cardiomediastinal silhouette. No acute osseous  abnormality. IMPRESSION: 1. No pneumothorax. 2. Unchanged small left pleural effusion and left basilar atelectasis. Electronically Signed   By: Titus Dubin M.D.   On: 11/22/2020 08:59   DG Chest Port 1 View  Result Date: 11/21/2020 CLINICAL DATA:  Open heart surgery.  Chest tube. EXAM: PORTABLE CHEST 1 VIEW COMPARISON:  11/20/2020 FINDINGS: Endotracheal tube and NG tube removed. Swan-Ganz catheter in the main pulmonary artery unchanged. Two chest tubes on the left unchanged. No pneumothorax Improved lung volumes. Persistent left lower lobe atelectasis. Right lung now clear. No heart failure or effusion. IMPRESSION: No pneumothorax. Improved aeration of the lungs. Persistent left lower lobe atelectasis. Electronically Signed   By: Franchot Gallo M.D.   On: 11/21/2020 08:57   DG Chest Port 1 View  Result Date: 11/20/2020 CLINICAL DATA:  Post CABG EXAM: PORTABLE CHEST 1 VIEW COMPARISON:  Portable exam 1245 hours compared to 08/14/2017 FINDINGS: Tip of endotracheal tube projects 5.4 cm above carina. Nasogastric tube extends into stomach. Mediastinal drains and LEFT thoracostomy tube present. Tip of RIGHT jugular Swan-Ganz catheter projects over bifurcation of main pulmonary artery. Normal heart size post interval median sternotomy and prior coronary  stenting. Bibasilar atelectasis. Upper lungs clear. No pleural effusion or pneumothorax. IMPRESSION: Postoperative changes with bibasilar atelectasis. Line and tube positions as above. Electronically Signed   By: Lavonia Dana M.D.   On: 11/20/2020 13:16   ECHOCARDIOGRAM COMPLETE  Result Date: 11/15/2020    ECHOCARDIOGRAM REPORT   Patient Name:   [name] [facility] Date of Exam: 11/15/2020 Medical Rec #:  [id number]            Height:       68.0 in Accession #:    [id number]           Weight:       186.5 lb Date of Birth:  12/07/1950            BSA:          1.984 m Patient Age:    61 years             BP:           146/88 mmHg Patient Gender: M                     HR:           80 bpm. Exam Location:  Inpatient Procedure: 2D Echo Indications:    acute ischemic heart disease  History:        Patient has prior history of Echocardiogram examinations, most                 recent 05/25/2008. Cardiomyopathy, CAD, Signs/Symptoms:Chest                 Pain; Risk Factors:Hypertension, Dyslipidemia, Diabetes and                 Sleep Apnea.  Sonographer:    Johny Chess Referring Phys: [place]  1. Left ventricular ejection fraction, by estimation, is 30 to 35%. The left ventricle has moderately decreased function. The left ventricle demonstrates regional wall motion abnormalities with mid to apical anteroseptal and inferoseptal akinesis. Akinesis of the apical anterior, apical lateral, and apical inferior walls as well as the true apex. Left ventricular diastolic parameters are consistent with Grade I diastolic dysfunction (impaired relaxation).  2. Right ventricular systolic function is normal. The right ventricular size is normal. Tricuspid regurgitation signal is inadequate for assessing PA pressure.  3. Left atrial size was mildly dilated.  4. The mitral valve is normal in structure. Trivial mitral valve regurgitation. No evidence of mitral stenosis.  5. The aortic valve is tricuspid. Aortic valve regurgitation is not visualized. Mild aortic valve sclerosis is present, with no evidence of aortic valve stenosis.  6. The inferior vena cava is normal in size with greater than 50% respiratory variability, suggesting right atrial pressure of 3 mmHg. FINDINGS  Left Ventricle: Left ventricular ejection fraction, by estimation, is 30 to 35%. The left ventricle has moderately decreased function. The left ventricle demonstrates regional wall motion abnormalities. The left ventricular internal cavity size was normal in size. There is no left ventricular hypertrophy. Left ventricular diastolic parameters are consistent with Grade I diastolic dysfunction (impaired  relaxation). Right Ventricle: The right ventricular size is normal. No increase in right ventricular wall thickness. Right ventricular systolic function is normal. Tricuspid regurgitation signal is inadequate for assessing PA pressure. Left Atrium: Left atrial size was mildly dilated. Right Atrium: Right atrial size was normal in size. Pericardium: There is no evidence of pericardial effusion. Mitral Valve: The mitral valve is normal in  structure. Trivial mitral valve regurgitation. No evidence of mitral valve stenosis. Tricuspid Valve: The tricuspid valve is normal in structure. Tricuspid valve regurgitation is not demonstrated. Aortic Valve: The aortic valve is tricuspid. Aortic valve regurgitation is not visualized. Mild aortic valve sclerosis is present, with no evidence of aortic valve stenosis. Pulmonic Valve: The pulmonic valve was normal in structure. Pulmonic valve regurgitation is not visualized. Aorta: The aortic root is normal in size and structure. Venous: The inferior vena cava is normal in size with greater than 50% respiratory variability, suggesting right atrial pressure of 3 mmHg. IAS/Shunts: No atrial level shunt detected by color flow Doppler.  LEFT VENTRICLE PLAX 2D LVIDd:         5.30 cm      Diastology LVIDs:         4.80 cm      LV e' medial:    8.38 cm/s LV PW:         1.10 cm      LV E/e' medial:  10.7 LV IVS:        0.90 cm      LV e' lateral:   10.90 cm/s LVOT diam:     2.10 cm      LV E/e' lateral: 8.2 LV SV:         71 LV SV Index:   36 LVOT Area:     3.46 cm  LV Volumes (MOD) LV vol d, MOD A2C: 129.0 ml LV vol d, MOD A4C: 125.0 ml LV vol s, MOD A2C: 70.0 ml LV vol s, MOD A4C: 77.2 ml LV SV MOD A2C:     59.0 ml LV SV MOD A4C:     125.0 ml LV SV MOD BP:      52.7 ml RIGHT VENTRICLE             IVC RV S prime:     14.30 cm/s  IVC diam: 1.80 cm TAPSE (M-mode): 2.3 cm LEFT ATRIUM             Index       RIGHT ATRIUM           Index LA diam:        4.30 cm 2.17 cm/m  RA Area:     14.00 cm  LA Vol (A2C):   83.0 ml 41.84 ml/m RA Volume:   33.50 ml  16.89 ml/m LA Vol (A4C):   70.9 ml 35.74 ml/m LA Biplane Vol: 78.0 ml 39.32 ml/m  AORTIC VALVE LVOT Vmax:   103.00 cm/s LVOT Vmean:  68.700 cm/s LVOT VTI:    0.205 m  AORTA Ao Root diam: 3.10 cm Ao Asc diam:  3.10 cm MITRAL VALVE MV Area (PHT): 4.60 cm     SHUNTS MV Decel Time: 165 msec     Systemic VTI:  0.20 m MV E velocity: 89.60 cm/s   Systemic Diam: 2.10 cm MV A velocity: 118.00 cm/s MV E/A ratio:  0.76 Loralie Champagne MD Electronically signed by Loralie Champagne MD Signature Date/Time: 11/15/2020/5:43:17 PM    Final    ECHO INTRAOPERATIVE TEE  Result Date: 11/21/2020  *INTRAOPERATIVE TRANSESOPHAGEAL REPORT *  Patient Name:   [name] [facility] Date of Exam: 11/20/2020 Medical Rec #:  [id number]            Height:       68.0 in Accession #:    [id number]           Weight:  186.5 lb Date of Birth:  07/29/1950            BSA:          1.98 m Patient Age:    37 years             BP:           137/95 mmHg Patient Gender: M                    HR:           70 bpm. Exam Location:  Anesthesiology Transesophogeal exam was perform intraoperatively during surgical procedure. Patient was closely monitored under general anesthesia during the entirety of examination. Indications:     CAD Native Vessel i25.10 Sonographer:     Raquel Sarna Senior RDCS Performing Phys: [id number] KHTXHFS Z ATKINS Diagnosing Phys: Hoy Morn MD Complications: No known complications during this procedure. POST-OP IMPRESSIONS - Left Ventricle: The left ventricle is unchanged from pre-bypass. has mildly reduced systolic function, with an ejection fraction of 45%. The cavity size was normal. The wall motion is abnormal with regional variation. - Right Ventricle: The right ventricle appears unchanged from pre-bypass. - Aorta: The aorta appears unchanged from pre-bypass. - Left Atrial Appendage: The left atrial appendage appears unchanged from pre-bypass. - Aortic Valve: The aortic valve  appears unchanged from pre-bypass. - Mitral Valve: There is mild regurgitation. - Tricuspid Valve: The tricuspid valve appears unchanged from pre-bypass. - Pulmonic Valve: The pulmonic valve appears unchanged from pre-bypass. - Interatrial Septum: The interatrial septum appears unchanged from pre-bypass. - Pericardium: The pericardium appears unchanged from pre-bypass. - Comments: Post-bypass images reviewed with the surgeon. PRE-OP FINDINGS  Left Ventricle: The left ventricle has mildly reduced systolic function, with an ejection fraction of 45-50%. The cavity size was normal. There is no left ventricular hypertrophy.  LV Wall Scoring: The mid anteroseptal segment and mid inferoseptal segment are hypokinetic.  Right Ventricle: The right ventricle has normal systolic function. The cavity was normal. There is no increase in right ventricular wall thickness. Left Atrium: Left atrial size was dilated. No left atrial/left atrial appendage thrombus was detected. The left atrial appendage is well visualized and there is no evidence of thrombus present. Right Atrium: Right atrial size was normal in size. Interatrial Septum: No atrial level shunt detected by color flow Doppler. Pericardium: There is no evidence of pericardial effusion. Mitral Valve: The mitral valve is normal in structure. Mitral valve regurgitation is trivial by color flow Doppler. There is No evidence of mitral stenosis. Tricuspid Valve: The tricuspid valve was normal in structure. Tricuspid valve regurgitation was not visualized by color flow Doppler. Aortic Valve: The aortic valve is tricuspid Aortic valve regurgitation was not visualized by color flow Doppler. There is no stenosis of the aortic valve. Pulmonic Valve: The pulmonic valve was normal in structure. Pulmonic valve regurgitation is not visualized by color flow Doppler. Aorta: The aortic root, ascending aorta and aortic arch are normal in size and structure. Pulmonary Artery: The pulmonary  artery is of normal size.  Hoy Morn MD Electronically signed by Hoy Morn MD Signature Date/Time: 11/21/2020/5:04:46 PM    Final    VAS US DOPPLER PRE CABG  Result Date: 11/19/2020 PREOPERATIVE VASCULAR EVALUATION Patient Name:  [name] [facility]  Date of Exam:   11/19/2020 Medical Rec #: [id number]             Accession #:    [id number] Date of Birth: April 17, 1951  Patient Gender: M Patient Age:   47Y Exam Location:  [facility] Procedure:      VAS US DOPPLER PRE CABG Referring Phys: [id number] [place] --------------------------------------------------------------------------------  Indications:      Pre-CABG. Risk Factors:     Hypertension, hyperlipidemia, Diabetes, past history of                   smoking, coronary artery disease. Other Factors:    History of multiple PCIs/stents. Comparison Study: No prior study Performing Technologist: Sharion Dove RVS  Examination Guidelines: A complete evaluation includes B-mode imaging, spectral Doppler, color Doppler, and power Doppler as needed of all accessible portions of each vessel. Bilateral testing is considered an integral part of a complete examination. Limited examinations for reoccurring indications may be performed as noted.  Right Carotid Findings: +----------+--------+--------+--------+------------+------------------+           PSV cm/sEDV cm/sStenosisDescribe    Comments           +----------+--------+--------+--------+------------+------------------+ CCA Prox  57      10                          intimal thickening +----------+--------+--------+--------+------------+------------------+ CCA Distal70      13                          intimal thickening +----------+--------+--------+--------+------------+------------------+ ICA Prox  59      19              heterogenousShadowing          +----------+--------+--------+--------+------------+------------------+ ICA Distal80      26                                              +----------+--------+--------+--------+------------+------------------+ ECA       100     11                                             +----------+--------+--------+--------+------------+------------------+ Portions of this table do not appear on this page. +----------+--------+-------+--------+------------+           PSV cm/sEDV cmsDescribeArm Pressure +----------+--------+-------+--------+------------+ Subclavian121                                 +----------+--------+-------+--------+------------+ +---------+--------+--+--------+-+ VertebralPSV cm/s36EDV cm/s9 +---------+--------+--+--------+-+ Left Carotid Findings: +----------+--------+--------+--------+--------+------------------+           PSV cm/sEDV cm/sStenosisDescribeComments           +----------+--------+--------+--------+--------+------------------+ CCA Prox  90      14                      intimal thickening +----------+--------+--------+--------+--------+------------------+ CCA Distal83      14                      intimal thickening +----------+--------+--------+--------+--------+------------------+ ICA Prox  67      18                                         +----------+--------+--------+--------+--------+------------------+  ICA Distal81      26                                         +----------+--------+--------+--------+--------+------------------+ ECA       99      11                                         +----------+--------+--------+--------+--------+------------------+ +----------+--------+--------+--------+------------+ SubclavianPSV cm/sEDV cm/sDescribeArm Pressure +----------+--------+--------+--------+------------+           132                                  +----------+--------+--------+--------+------------+ +---------+--------+--+--------+-+ VertebralPSV cm/s34EDV cm/s6 +---------+--------+--+--------+-+  ABI  Findings: +---------+------------------+-----+-----------+--------+ Right    Rt Pressure (mmHg)IndexWaveform   Comment  +---------+------------------+-----+-----------+--------+ Brachial 127                    multiphasic         +---------+------------------+-----+-----------+--------+ PTA      107               0.84 multiphasic         +---------+------------------+-----+-----------+--------+ DP       105               0.83 multiphasic         +---------+------------------+-----+-----------+--------+ Great Toe90                0.71                     +---------+------------------+-----+-----------+--------+ +---------+------------------+-----+-----------+-------+ Left     Lt Pressure (mmHg)IndexWaveform   Comment +---------+------------------+-----+-----------+-------+ Brachial 127                    multiphasic        +---------+------------------+-----+-----------+-------+ PTA      152               1.20 multiphasic        +---------+------------------+-----+-----------+-------+ DP       136               1.07 multiphasic        +---------+------------------+-----+-----------+-------+ Great [id number]               0.92                    +---------+------------------+-----+-----------+-------+ +-------+---------------+----------------+ ABI/TBIToday's ABI/TBIPrevious ABI/TBI +-------+---------------+----------------+ Right  0.84                            +-------+---------------+----------------+ Left   1.2                             +-------+---------------+----------------+  Right Doppler Findings: +--------+--------+-----+-----------+--------+ Site    PressureIndexDoppler    Comments +--------+--------+-----+-----------+--------+ [id number]          multiphasic         +--------+--------+-----+-----------+--------+ Radial               multiphasic         +--------+--------+-----+-----------+--------+ Ulnar  multiphasic         +--------+--------+-----+-----------+--------+  Left Doppler Findings: +--------+--------+-----+-----------+--------+ Site    PressureIndexDoppler    Comments +--------+--------+-----+-----------+--------+ [id number]          multiphasic         +--------+--------+-----+-----------+--------+ Radial               multiphasic         +--------+--------+-----+-----------+--------+ Ulnar                multiphasic         +--------+--------+-----+-----------+--------+  Summary: Right Carotid: The extracranial vessels were near-normal with only minimal wall                thickening or plaque. Left Carotid: The extracranial vessels were near-normal with only minimal wall               thickening or plaque. Vertebrals:  Bilateral vertebral arteries demonstrate antegrade flow. Subclavians: Normal flow hemodynamics were seen in bilateral subclavian              arteries. Right ABI: Resting right ankle-brachial index indicates mild right lower extremity arterial disease. The right toe-brachial index is normal. Left ABI: Resting left ankle-brachial index is within normal range. No evidence of significant left lower extremity arterial disease. The left toe-brachial index is normal. Right Upper Extremity: Doppler waveform obliterate with right radial compression. Doppler waveform obliterate with right ulnar compression. Left Upper Extremity: Doppler waveforms remain within normal limits with left radial compression. Doppler waveforms decrease 50% with left ulnar compression.  Electronically signed by Monica Martinez MD on 11/19/2020 at 10:29:15 AM.    Final      Discharge Medications: Allergies as of 11/24/2020       Reactions   Hydroxyzine Hcl Other (See Comments)   Weakness, out of this world feeling. sluggishness   Latex Rash   I.e. Elastic= rash to blisters if worn for an extended time Patient show effects after long exposure.        Medication List      STOP taking these medications    Acidophilus Probiotic 100 MG Caps   Bayer Back & Body 500-32.5 MG Tabs Generic drug: Aspirin-Caffeine   blood glucose meter kit and supplies Kit   diclofenac sodium 1 % Gel Commonly known as: Voltaren   ketoconazole 2 % shampoo Commonly known as: NIZORAL   mupirocin ointment 2 % Commonly known as: BACTROBAN   nitroGLYCERIN 0.4 MG/SPRAY spray Commonly known as: NITROLINGUAL   prasugrel 10 MG Tabs tablet Commonly known as: EFFIENT   ramipril 2.5 MG capsule Commonly known as: ALTACE   ranolazine 1000 MG SR tablet Commonly known as: RANEXA   triamcinolone cream 0.1 % Commonly known as: KENALOG       TAKE these medications    aspirin 325 MG EC tablet Take 1 tablet (325 mg total) by mouth daily. What changed:  medication strength how much to take   atorvastatin 80 MG tablet Commonly known as: LIPITOR Take 1 tablet (80 mg total) by mouth daily.   carvedilol 6.25 MG tablet Commonly known as: COREG TAKE 1 TABLET TWICE A DAY WITH MEALS   colchicine 0.6 MG tablet Take 0.5 tablets (0.3 mg total) by mouth 2 (two) times daily.   diphenhydramine-acetaminophen 25-500 MG Tabs tablet Commonly known as: TYLENOL PM Take 2 tablets by mouth at bedtime.   furosemide 40 MG tablet Commonly known as: LASIX Take 1 tablet (40 mg total) by mouth daily.  isosorbide mononitrate 30 MG 24 hr tablet Commonly known as: IMDUR Take 1 tablet (30 mg total) by mouth daily.   Januvia 100 MG tablet Generic drug: sitaGLIPtin TAKE 1 TABLET DAILY What changed: how much to take   Jardiance 25 MG Tabs tablet Generic drug: empagliflozin TAKE 1 TABLET DAILY What changed: how much to take   metFORMIN 1000 MG tablet Commonly known as: GLUCOPHAGE TAKE 1 TABLET TWICE A DAY WITH A MEAL What changed: See the new instructions.   multivitamins ther. w/minerals Tabs tablet Take 1 tablet by mouth every morning.   niacin 1000 MG CR tablet Commonly known  as: NIASPAN TAKE 2 TABLETS DAILY AT BEDTIME What changed:  how much to take how to take this when to take this additional instructions   potassium chloride SA 20 MEQ tablet Commonly known as: KLOR-CON Take 1 tablet (20 mEq total) by mouth 2 (two) times daily.   traMADol 50 MG tablet Commonly known as: ULTRAM Take 1 tablet (50 mg total) by mouth every 6 (six) hours as needed for moderate pain.       The patient has been discharged on:   1.Beta Blocker:  Yes [  x ]                              No   [   ]                              If No, reason:  2.Ace Inhibitor/ARB: Yes [   ]                                     No  [   n ]                                     If No, reason:labils BP  3.Statin:   Yes [  x ]                  No  [   ]                  If No, reason:  4.Ecasa:  Yes  [ x  ]                  No   [   ]                  If No, reason:  Patient had ACS upon admission:NO  Plavix/P2Y12 inhibitor: Yes [   ]                                      No  [  x ]   Follow Up Appointments:  Follow-up Information     Minna Merritts, MD. Go on 12/07/2020.   Specialty: Cardiology Why: Appointment time is at 10:20 am Contact information: [place] [address] [phone]         [name], MD. Call.   Specialty: Family Medicine Why: for a follow up appointment regarding further diabetes management and surveillance of  HGA1C 8.6 Contact information: [place] [address] [place] [address] [phone]         [facility] [place]. Go on 12/24/2020.   Specialty: Cardiothoracic Surgery Why: PA/LAT CXR to be taken (at [place] which is in the same building as Dr. Orvan Seen office) on 08/15 at 1:00 pm;Appointment time is at 1:30 pm Contact information: [address] [place], [place] [phone]                 Signed: Gaspar Bidding 11/24/2020, 8:37 AM

## 2020-11-22 ENCOUNTER — Inpatient Hospital Stay (HOSPITAL_COMMUNITY): Payer: Medicare Other

## 2020-11-22 LAB — BASIC METABOLIC PANEL
Anion gap: 6 (ref 5–15)
BUN: 17 mg/dL (ref 8–23)
CO2: 24 mmol/L (ref 22–32)
Calcium: 8.4 mg/dL — ABNORMAL LOW (ref 8.9–10.3)
Chloride: 106 mmol/L (ref 98–111)
Creatinine, Ser: 0.94 mg/dL (ref 0.61–1.24)
GFR, Estimated: >60 mL/min (ref >60)
Glucose, Bld: 163 mg/dL — ABNORMAL HIGH (ref 70–99)
Potassium: 4.4 mmol/L (ref 3.5–5.1)
Sodium: 136 mmol/L (ref 135–145)

## 2020-11-22 LAB — GLUCOSE, CAPILLARY
Glucose-Capillary: 103 mg/dL — ABNORMAL HIGH (ref 70–99)
Glucose-Capillary: 267 mg/dL — ABNORMAL HIGH (ref 70–99)
Glucose-Capillary: 264 mg/dL — ABNORMAL HIGH (ref 70–99)
Glucose-Capillary: 194 mg/dL — ABNORMAL HIGH (ref 70–99)
Glucose-Capillary: 126 mg/dL — ABNORMAL HIGH (ref 70–99)
Glucose-Capillary: 227 mg/dL — ABNORMAL HIGH (ref 70–99)

## 2020-11-22 LAB — CBC
HCT: 39.6 % (ref 39.0–52.0)
Hemoglobin: 13.2 g/dL (ref 13.0–17.0)
MCH: 32.7 pg (ref 26.0–34.0)
MCHC: 33.3 g/dL (ref 30.0–36.0)
MCV: 98 fL (ref 80.0–100.0)
Platelets: 137 10*3/uL — ABNORMAL LOW (ref 150–400)
RBC: 4.04 MIL/uL — ABNORMAL LOW (ref 4.22–5.81)
RDW: 12.9 % (ref 11.5–15.5)
WBC: 12 10*3/uL — ABNORMAL HIGH (ref 4.0–10.5)
nRBC: 0 % (ref 0.0–0.2)

## 2020-11-22 MED ORDER — POTASSIUM CHLORIDE CRYS ER 20 MEQ PO TBCR: 20.0000 meq | EXTENDED_RELEASE_TABLET | Freq: Every day | ORAL | Status: DC

## 2020-11-22 MED ORDER — POTASSIUM CHLORIDE CRYS ER 20 MEQ PO TBCR
20.0000 meq | EXTENDED_RELEASE_TABLET | Freq: Two times a day (BID) | ORAL | Status: DC
  Administered 2020-11-22 – 2020-11-24 (×5): 20 meq via ORAL
  Filled 2020-11-22 (×6): qty 1

## 2020-11-22 MED ORDER — FUROSEMIDE 10 MG/ML IJ SOLN
40.0000 mg | Freq: Two times a day (BID) | INTRAMUSCULAR | Status: DC
  Administered 2020-11-22 (×2): 40 mg via INTRAVENOUS
  Filled 2020-11-22 (×2): qty 4

## 2020-11-22 MED ORDER — LACTULOSE 10 GM/15ML PO SOLN
20.0000 g | Freq: Once | ORAL | Status: AC
  Administered 2020-11-22: 20 g via ORAL
  Filled 2020-11-22: qty 30

## 2020-11-22 MED ORDER — FUROSEMIDE 40 MG PO TABS: 40.0000 mg | ORAL_TABLET | Freq: Every day | ORAL | Status: DC

## 2020-11-22 MED ORDER — FUROSEMIDE 10 MG/ML IJ SOLN: 40.0000 mg | Freq: Once | INTRAMUSCULAR | Status: DC

## 2020-11-22 MED ORDER — METOPROLOL TARTRATE 25 MG PO TABS
25.0000 mg | ORAL_TABLET | Freq: Two times a day (BID) | ORAL | Status: DC
  Administered 2020-11-22 (×2): 25 mg via ORAL
  Filled 2020-11-22 (×2): qty 1

## 2020-11-22 MED ORDER — METOPROLOL TARTRATE 25 MG/10 ML ORAL SUSPENSION
25.0000 mg | Freq: Two times a day (BID) | ORAL | Status: DC
  Filled 2020-11-22 (×2): qty 10

## 2020-11-22 MED FILL — Heparin Sodium (Porcine) Inj 1000 Unit/ML: INTRAMUSCULAR | Qty: 30 | Status: AC

## 2020-11-22 MED FILL — Sodium Chloride IV Soln 0.9%: INTRAVENOUS | Qty: 2000 | Status: AC

## 2020-11-22 MED FILL — Heparin Sodium (Porcine) Inj 1000 Unit/ML: INTRAMUSCULAR | Qty: 10 | Status: AC

## 2020-11-22 MED FILL — Mannitol IV Soln 20%: INTRAVENOUS | Qty: 500 | Status: AC

## 2020-11-22 MED FILL — Potassium Chloride Inj 2 mEq/ML: INTRAVENOUS | Qty: 40 | Status: AC

## 2020-11-22 MED FILL — Calcium Chloride Inj 10%: INTRAVENOUS | Qty: 10 | Status: AC

## 2020-11-22 MED FILL — Magnesium Sulfate Inj 50%: INTRAMUSCULAR | Qty: 10 | Status: AC

## 2020-11-22 MED FILL — Sodium Bicarbonate IV Soln 8.4%: INTRAVENOUS | Qty: 50 | Status: AC

## 2020-11-22 MED FILL — Electrolyte-R (PH 7.4) Solution: INTRAVENOUS | Qty: 3000 | Status: AC

## 2020-11-22 NOTE — Progress Notes (Signed)
CARDIAC REHAB PHASE I   Went to offer to walk pt several times throughout afternoon, pt asleep in the bed. Ambulating independently without difficulty. Maintaining sats on RA. Will continue to follow.  Rufina Falco, RN BSN 11/22/2020 2:49 PM

## 2020-11-22 NOTE — TOC Initial Note (Signed)
Transition of Care [facility]) - Initial/Assessment Note    Patient Details  Name: [name] MRN: [id number] Date of Birth: 1951/02/18  [facility] of [facility]) CM/SW Contact:    Verdell Carmine, RN Phone Number: 11/22/2020, 9:41 AM  Clinical Narrative:                 70 year old patient admitted for CABG. Lives with spouse, has PCP. Waiting for evaluation of PT/OT to see if he needs HH./DME CM will follow for needs   Expected Discharge Plan: [place] Barriers to Discharge: Continued Medical Work up   Patient Goals and CMS Choice        Expected Discharge Plan and Services Expected Discharge Plan: [place]   Discharge Planning Services: CM Consult   Living arrangements for the past 2 months: Single Family Home                                      Prior Living Arrangements/Services Living arrangements for the past 2 months: Single Family Home Lives with:: Spouse Patient language and need for interpreter reviewed:: Yes        Need for Family Participation in Patient Care: Yes (Comment) Care giver support system in place?: Yes (comment)   Criminal Activity/Legal Involvement Pertinent to Current Situation/Hospitalization: No - Comment as needed  Activities of Daily Living Home Assistive Devices/Equipment: None ADL Screening (condition at time of admission) Patient's cognitive ability adequate to safely complete daily activities?: Yes Is the patient deaf or have difficulty hearing?: No Does the patient have difficulty seeing, even when wearing glasses/contacts?: No Does the patient have difficulty concentrating, remembering, or making decisions?: No Patient able to express need for assistance with ADLs?: Yes Does the patient have difficulty dressing or bathing?: No Independently performs ADLs?: Yes (appropriate for developmental age) Does the patient have difficulty walking or climbing stairs?: No Weakness of Legs:  None Weakness of Arms/Hands: None  Permission Sought/Granted                  Emotional Assessment       Orientation: : Oriented to Self, Oriented to Place, Oriented to  Time, Oriented to Situation Alcohol / Substance Use: Not Applicable Psych Involvement: No (comment)  Admission diagnosis:  Unstable angina (HCC) [I20.0] CAD (coronary artery disease) [I25.10] Coronary artery disease [I25.10] Patient Active Problem List   Diagnosis Date Noted   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   Coronary artery disease 11/20/2020   Rash 01/25/2020   Colon cancer screening 10/18/2019   Bone spicules of jaw 12/27/2018   Ingrowing nail 12/27/2018   Insomnia 02/18/2018   Ingrown toenail 01/26/2018   Adrenal adenoma 03/03/2017   Right shoulder pain 03/03/2017   History of colon polyps 12/01/2016   Olecranon bursitis of left elbow 12/01/2016   OSA (obstructive sleep apnea) 07/30/2016   Low back pain 05/15/2016   Actinic keratoses 02/07/2016   Right hip pain 02/07/2016   Ischemic cardiomyopathy    Type 2 diabetes mellitus, controlled ([place]) 11/30/2014   Sleep disturbance 08/08/2013   Unstable angina ([place]) 04/12/2011   CAD (coronary artery disease) 04/12/2011   HTN (hypertension) 04/12/2011   Hyperlipemia 04/12/2011   PCP:  [name], MD Pharmacy:   [place], [place] [address] [place] [address]  Phone: [phone] Fax: [phone]  [id number] [place], [place] [address] Phone: [phone] Fax: [phone]     Social Determinants of Health (SDOH) Interventions    Readmission Risk Interventions No flowsheet data found.

## 2020-11-22 NOTE — Progress Notes (Signed)
Mobility Specialist: Progress Note   11/22/20 1719  Mobility  Activity Ambulated in hall  Level of Assistance Modified independent, requires aide device or extra time  Assistive Device Front wheel walker  Distance Ambulated (ft) 1500 ft  Mobility Ambulated with assistance in hallway  Mobility Response Tolerated well  Mobility performed by Mobility specialist  Bed Position Chair  $Mobility charge 1 Mobility   Post-Mobility: 95 HR  Met up with pt already ambulating in the hallway. Pt asx throughout. Pt able to ambulate independently. Pt back to recliner after walk with RN present in the room.   [facility] [name] Mobility Specialist Mobility Specialist Phone: [phone]

## 2020-11-22 NOTE — Progress Notes (Signed)
Patient resting comfortably on home CPAP unit.

## 2020-11-22 NOTE — Progress Notes (Addendum)
TCTS DAILY ICU PROGRESS NOTE                   [place]            [place], [address]          [phone]   2 Days Post-Op Procedure(s) (LRB): CORONARY ARTERY BYPASS GRAFTING (CABG) x  FIVE ON PUMP  USING LEFT INTERNAL MAMMARY ARTERTY AND LEFT ENDOSCOPIC GREATER SAPHENOUS VEIN CONDUITS, RIGHT LEG OPENED NOT HARVESTED (N/A) TRANSESOPHAGEAL ECHOCARDIOGRAM (TEE) (N/A) APPLICATION OF CELL SAVER  Total Length of Stay:  LOS: 6 days   Subjective: Patient states, "I have not had a bowel movement in 5 days and would likely something to help this".  Objective: Vital signs in last 24 hours: Temp:  [98.2 F (36.8 C)-99 F (37.2 C)] 98.4 F (36.9 C) (07/13 2345) Pulse Rate:  [74-90] 88 (07/14 0700) Cardiac Rhythm: Normal sinus rhythm (07/14 0000) Resp:  [12-23] 23 (07/14 0400) BP: (111-145)/(53-93) 142/73 (07/14 0700) SpO2:  [93 %-99 %] 93 % (07/14 0700) Arterial Line BP: (101-159)/(40-60) 128/47 (07/13 1500) Weight:  [89.6 kg] 89.6 kg (07/14 0400)  Filed Weights   11/15/20 1519 11/21/20 0609 11/22/20 0400  Weight: 84.6 kg 87.7 kg 89.6 kg    Weight change: 1.931 kg   Hemodynamic parameters for last 24 hours: PAP: (19-31)/(5-15) 19/5  Intake/Output from previous day: 07/13 0701 - 07/14 0700 In: 871.7 [I.V.:571.3; IV Piggyback:300.4] Out: 0938 [Urine:1145; Chest Tube:350]  Intake/Output this shift: No intake/output data recorded.  Current Meds: Scheduled Meds:  acetaminophen  1,000 mg Oral Q6H   Or   acetaminophen (TYLENOL) oral liquid 160 mg/5 mL  1,000 mg Per Tube Q6H   aspirin EC  325 mg Oral Daily   Or   aspirin  324 mg Per Tube Daily   atorvastatin  80 mg Oral Daily   bisacodyl  10 mg Oral Daily   Or   bisacodyl  10 mg Rectal Daily   Chlorhexidine Gluconate Cloth  6 each Topical Daily   colchicine  0.3 mg Oral BID   diazepam  2 mg Oral Q8H   docusate sodium  200 mg Oral Daily   insulin aspart  0-24 Units Subcutaneous Q4H   insulin detemir   12 Units Subcutaneous Daily   metoprolol tartrate  12.5 mg Oral BID   Or   metoprolol tartrate  12.5 mg Per Tube BID   pantoprazole  40 mg Oral Daily   sodium chloride flush  10-40 mL Intracatheter Q12H   sodium chloride flush  3 mL Intravenous Q12H   Continuous Infusions:  sodium chloride Stopped (11/21/20 0949)   sodium chloride     sodium chloride 10 mL/hr at 11/20/20 1309   dexmedetomidine (PRECEDEX) IV infusion Stopped (11/20/20 1507)   lactated ringers     lactated ringers     lactated ringers Stopped (11/21/20 1311)   nitroGLYCERIN Stopped (11/20/20 1348)   phenylephrine (NEO-SYNEPHRINE) Adult infusion Stopped (11/21/20 0604)   PRN Meds:.sodium chloride, dextrose, lactated ringers, levalbuterol, metoprolol tartrate, midazolam, morphine injection, ondansetron (ZOFRAN) IV, oxyCODONE, sodium chloride flush, sodium chloride flush, traMADol  General appearance: alert, cooperative, and no distress Neurologic: intact Heart: RRR Lungs: Slightly diminished bibasilar breath sounds Abdomen: Soft, non tender, sporadic bowel sounds present Extremities: Mild LE edema Wound: Aquacel intact on sternum. Bilateral LE wounds are clean and dry  Lab Results: CBC:   BMET:  Recent Labs    11/21/20 1703 11/22/20 0402  NA 139  136  K 4.3 4.4  CL 107 106  CO2 25 24  GLUCOSE 105* 163*  BUN 15 17  CREATININE 0.97 0.94  CALCIUM 8.5* 8.4*     CMET: Lab Results  Component Value Date   WBC 12.0 (H) 11/22/2020   HGB 13.2 11/22/2020   HCT 39.6 11/22/2020   PLT 137 (L) 11/22/2020   GLUCOSE 163 (H) 11/22/2020   CHOL 194 11/17/2020   TRIG 163 (H) 11/17/2020   HDL 40 (L) 11/17/2020   LDLDIRECT 71.0 07/11/2019   LDLCALC 121 (H) 11/17/2020   ALT 67 (H) 11/20/2020   AST 58 (H) 11/20/2020   NA 136 11/22/2020   K 4.4 11/22/2020   CL 106 11/22/2020   CREATININE 0.94 11/22/2020   BUN 17 11/22/2020   CO2 24 11/22/2020   TSH 0.919 11/22/2014   PSA 1.24 09/01/2013   INR 1.3 (H)  11/20/2020   HGBA1C 8.6 (H) 11/15/2020    PT/INR:  Recent Labs    11/20/20 1249  LABPROT 15.7*  INR 1.3*    Radiology: No results found.   Assessment/Plan: S/P Procedure(s) (LRB): CORONARY ARTERY BYPASS GRAFTING (CABG) x  FIVE ON PUMP  USING LEFT INTERNAL MAMMARY ARTERTY AND LEFT ENDOSCOPIC GREATER SAPHENOUS VEIN CONDUITS, RIGHT LEG OPENED NOT HARVESTED (N/A) TRANSESOPHAGEAL ECHOCARDIOGRAM (TEE) (N/A) APPLICATION OF CELL SAVER CV- SR with HR in the 80's. On  low dose Epinephrine drip this am as well as Lopressor 12.5 mg bid. Will increase Lopressor to 25 mg bid Pulmonary-History of OSA (on CPAP at night). On 4 liters of oxygen via Rafael Gonzalez. Wean as able over next few days. Chest tubes with 350 cc last 12 hours. CXR this am appears to show bibasilar atelectasis and small effusions. Encourage incentive spirometer. Volume overload-will give IV Lasix this am Mild expected post op blood loss anemia-H and H this am stable at 13.2 and 39.6 5. DM-CBGs 137/141/111. Pre op HGA1C 8.6. Will restart Jardiance, Januvia, and Metformin closer to discharge. Wean off Insulin drip. 6. Thrombocytopenia-platelets this am up to 137,000 7. LOC constipation 8. Likely transfer to 4#  Nani Skillern [facility] 11/22/2020 7:47 AM  Agree with documentation; txfer to progressive. Possibly home Sat if he diureses over next 2 days. Kymani Shimabukuro Z. Orvan Seen, [place]

## 2020-11-23 ENCOUNTER — Encounter (HOSPITAL_COMMUNITY): Payer: Self-pay | Admitting: Cardiothoracic Surgery

## 2020-11-23 ENCOUNTER — Inpatient Hospital Stay (HOSPITAL_COMMUNITY): Payer: Medicare Other

## 2020-11-23 DIAGNOSIS — I25118 Atherosclerotic heart disease of native coronary artery with other forms of angina pectoris: Secondary | ICD-10-CM

## 2020-11-23 DIAGNOSIS — Z951 Presence of aortocoronary bypass graft: Secondary | ICD-10-CM

## 2020-11-23 LAB — GLUCOSE, CAPILLARY
Glucose-Capillary: 105 mg/dL — ABNORMAL HIGH (ref 70–99)
Glucose-Capillary: 119 mg/dL — ABNORMAL HIGH (ref 70–99)
Glucose-Capillary: 204 mg/dL — ABNORMAL HIGH (ref 70–99)
Glucose-Capillary: 227 mg/dL — ABNORMAL HIGH (ref 70–99)
Glucose-Capillary: 241 mg/dL — ABNORMAL HIGH (ref 70–99)
Glucose-Capillary: 245 mg/dL — ABNORMAL HIGH (ref 70–99)

## 2020-11-23 LAB — BASIC METABOLIC PANEL
Anion gap: 8 (ref 5–15)
BUN: 17 mg/dL (ref 8–23)
CO2: 24 mmol/L (ref 22–32)
Calcium: 8.5 mg/dL — ABNORMAL LOW (ref 8.9–10.3)
Chloride: 105 mmol/L (ref 98–111)
Creatinine, Ser: 0.83 mg/dL (ref 0.61–1.24)
GFR, Estimated: >60 mL/min (ref >60)
Glucose, Bld: 124 mg/dL — ABNORMAL HIGH (ref 70–99)
Potassium: 3.9 mmol/L (ref 3.5–5.1)
Sodium: 137 mmol/L (ref 135–145)

## 2020-11-23 LAB — CBC
HCT: 39.3 % (ref 39.0–52.0)
Hemoglobin: 13.5 g/dL (ref 13.0–17.0)
MCH: 32.9 pg (ref 26.0–34.0)
MCHC: 34.4 g/dL (ref 30.0–36.0)
MCV: 95.9 fL (ref 80.0–100.0)
Platelets: 140 10*3/uL — ABNORMAL LOW (ref 150–400)
RBC: 4.1 MIL/uL — ABNORMAL LOW (ref 4.22–5.81)
RDW: 12.6 % (ref 11.5–15.5)
WBC: 11.5 10*3/uL — ABNORMAL HIGH (ref 4.0–10.5)
nRBC: 0 % (ref 0.0–0.2)

## 2020-11-23 MED ORDER — INSULIN ASPART 100 UNIT/ML IJ SOLN: 0.0000 [IU] | INTRAMUSCULAR | Status: DC

## 2020-11-23 MED ORDER — METFORMIN HCL 850 MG PO TABS
850.0000 mg | ORAL_TABLET | Freq: Two times a day (BID) | ORAL | Status: DC
  Administered 2020-11-23 – 2020-11-24 (×3): 850 mg via ORAL
  Filled 2020-11-23 (×3): qty 1

## 2020-11-23 MED ORDER — INSULIN ASPART 100 UNIT/ML IJ SOLN
0.0000 [IU] | Freq: Three times a day (TID) | INTRAMUSCULAR | Status: DC
Start: 2020-11-23 — End: 2020-11-24
  Administered 2020-11-23 (×3): 8 [IU] via SUBCUTANEOUS
  Administered 2020-11-24: 4 [IU] via SUBCUTANEOUS

## 2020-11-23 MED ORDER — FUROSEMIDE 10 MG/ML IJ SOLN
40.0000 mg | Freq: Once | INTRAMUSCULAR | Status: AC
  Administered 2020-11-23: 40 mg via INTRAVENOUS
  Filled 2020-11-23: qty 4

## 2020-11-23 MED ORDER — FUROSEMIDE 40 MG PO TABS
40.0000 mg | ORAL_TABLET | Freq: Every day | ORAL | Status: DC
  Administered 2020-11-24: 40 mg via ORAL
  Filled 2020-11-23: qty 1

## 2020-11-23 MED ORDER — ASPIRIN 325 MG PO TBEC: 325.0000 mg | DELAYED_RELEASE_TABLET | Freq: Every day | ORAL | 0 refills | Status: DC

## 2020-11-23 MED ORDER — INSULIN ASPART 100 UNIT/ML IJ SOLN: 0.0000 [IU] | Freq: Three times a day (TID) | INTRAMUSCULAR | Status: DC

## 2020-11-23 MED ORDER — CARVEDILOL 6.25 MG PO TABS
6.2500 mg | ORAL_TABLET | Freq: Two times a day (BID) | ORAL | Status: DC
  Administered 2020-11-23 – 2020-11-24 (×3): 6.25 mg via ORAL
  Filled 2020-11-23 (×3): qty 1

## 2020-11-23 NOTE — Progress Notes (Addendum)
      [address]       [place] [address]             [phone]        3 Days Post-Op Procedure(s) (LRB): CORONARY ARTERY BYPASS GRAFTING (CABG) x  FIVE ON PUMP  USING LEFT INTERNAL MAMMARY ARTERTY AND LEFT ENDOSCOPIC GREATER SAPHENOUS VEIN CONDUITS, RIGHT LEG OPENED NOT HARVESTED (N/A) TRANSESOPHAGEAL ECHOCARDIOGRAM (TEE) (N/A) APPLICATION OF CELL SAVER  Subjective: Patient had 2 bowel movements yesterday.   Objective: Vital signs in last 24 hours: Temp:  [98 F (36.7 C)-98.6 F (37 C)] 98.6 F (37 C) (07/15 0425) Pulse Rate:  [80-93] 83 (07/15 0425) Cardiac Rhythm: Normal sinus rhythm (07/14 1955) Resp:  [18-20] 20 (07/15 0425) BP: (106-139)/(63-81) 106/76 (07/15 0425) SpO2:  [92 %-98 %] 97 % (07/15 0425) Weight:  [86.2 kg] 86.2 kg (07/15 0425)  Pre op weight  85 kg Current Weight  11/23/20 86.2 kg      Intake/Output from previous day: 07/14 0701 - 07/15 0700 In: 600 [P.O.:600] Out: 3150 [Urine:3150]   Physical Exam:  Cardiovascular: RRR Pulmonary: Mostly clear Abdomen: Soft, non tender, bowel sounds present. Extremities: Mild bilateral lower extremity edema L>R Wounds: Aquacel removed and sternal wound is clean and dry.  No erythema or signs of infection.LE wounds are clean and dry  Lab Results: CBC: Recent Labs    11/22/20 0402 11/23/20 0527  WBC 12.0* 11.5*  HGB 13.2 13.5  HCT 39.6 39.3  PLT 137* 140*   BMET:  Recent Labs    11/22/20 0402 11/23/20 0430  NA 136 137  K 4.4 3.9  CL 106 105  CO2 24 24  GLUCOSE 163* 124*  BUN 17 17  CREATININE 0.94 0.83  CALCIUM 8.4* 8.5*    PT/INR:  Lab Results  Component Value Date   INR 1.3 (H) 11/20/2020   INR 1.0 11/20/2020   INR 1.1 (H) 06/23/2012   ABG:  INR: Will add last result for INR, ABG once components are confirmed Will add last 4 CBG results once components are confirmed  Assessment/Plan:  1. CV - SR with HR in the 80-90's. On Lopressor 25 mg bid. Will change  Lopressor to Coreg as depressed LVEF prior to surgery. 2.  Pulmonary - On room air. CXR this am appears stable (no pneumothorax, left base atelectasis, small pleural effusion). Encourage incentive spirometer 3. Volume Overload - On Lasix 40 mg IV bid. Will give once today and change to oral in am. 4. DM-CBGs 227/105/119. Pre op HGA1C 8.6. Will restart low dose Metformin today and stop scheduled Insulin. Will restart Januvia and Jardiance at discharge 5. Mild thrombocytopenia-platelets up to 140,000 6. Supplement potassium 7. Remove EPW 8. Possible discharge 1-2 days  Molly Savarino M ZimmermanPA-C 11/23/2020,7:14 AM

## 2020-11-23 NOTE — Progress Notes (Signed)
CARDIAC REHAB PHASE I   PRE:  Rate/Rhythm: 94 SR  BP:  Sitting: 122/74      SaO2: 97 RA  MODE:  Ambulation: >2200 ft   POST:  Rate/Rhythm: 101 ST  BP:  Sitting: 118/89    SaO2: 96 RA   Pt eager to ambulate. Pt independent to rise, and ambulated >2236ft in hallway independently with front wheel walker. Pt denies CP, SOB, or dizziness throughout walk. Pt returned to recliner. Pt denies home DME needs. Encouraged continued ambulation and IS use. Will continue to follow.  [phone] Rufina Falco, RN BSN 11/23/2020 10:34 AM

## 2020-11-23 NOTE — Progress Notes (Signed)
Patient EPW pulled per protocol and as ordered. All ends intact. Patient reminded to lie supine approximately one hour. Bp 112/84 hr 90 oxygen 93 on room air. Jobin Montelongo, Bettina Gavia RN

## 2020-11-23 NOTE — Progress Notes (Signed)
Progress Note  Patient Name: [name] Date of Encounter: 11/23/2020  [facility] Cardiologist: [name], MD   3 Days Post-Op Procedure(s) (LRB): CORONARY ARTERY BYPASS GRAFTING (CABG) x  FIVE ON PUMP  USING LEFT INTERNAL MAMMARY ARTERTY AND LEFT ENDOSCOPIC GREATER SAPHENOUS VEIN CONDUITS, RIGHT LEG OPENED NOT HARVESTED (N/A) TRANSESOPHAGEAL ECHOCARDIOGRAM (TEE) (N/A) APPLICATION OF CELL SAVER  Patient Profile     70 y.o. male with history of CAD, prior MI, DM, former tobacco abuse, sleep apnea, HTN, HLD, ischemic cardiomyopathy with recent chest pain concerning for unstable angina. Cardiac cath 11/15/20 with severe three vessel CAD. CT surgery consulted for CABG.  11/20/2020 -> CABG x 5 (LIMA-dLAD, SVG-PDA, SVG-D1, SeqSVG-OM1-2)   Subjective   Feeling pretty well.  Still a little sore.  Has a hard time pulling himself up in bed..  No chest pain or pressure.  Physical Exam   GEN: Resting comfortably in bed.  Was starting to get a little bit weak-needing to eat. Neck: No JVD or bruit RRR, Cardiac: RRR, normal S1 and S2.  No M/G with soft postoperative pericardial rub Respiratory: CTA B, nonlabored, good air movement.  Mild splinting GI: Soft/NT / ND NAbs.  No HSM. MS: No C/C/E. Neuro:  Nonfocal  Psych: Normal mood and affect.   Assessment & Plan    Unstable angina: Underwent cardiac catheterization on 7/7 with three-vessel CAD, 90% ostial/proximal LAD within the old stent, along proximal third distal circumflex disease of 70% and 50% distal RCA.   -- Surgery-status post CABG [phone] No further pain since CABG.  Is now on aspirin, high-dose atorvastatin and carvedilol.  Ischemic cardiomyopathy: Echocardiogram noted EF of 30 to 35% with mid to apical, anterior septal and inferior septal akinesis, grade 1 diastolic dysfunction. No overload present on exam. No signs of heart failure.  Euvolemic on exam.  Is on low-dose diuretic Currently on carvedilol, was on  losartan preop-plan will be to convert to [facility] and discharge once blood pressure more stabilized.   Hyperlipidemia: LDL 121 Continue home dose statin.  Also continue Zetia.  I suspect that he may require surgery. -- plan to add Zetia, suspect will need referral to lipid clinic at discharge   HTN: Blood pressure currently stable-was converted from Lopressor to carvedilol 6.25 mg twice daily.  Has not been started back on ARB.  Would like to see his blood pressure response to the carvedilol first.  However with reduced EF, would want to start back with an ARB (or if BP would tolerate-ARN I -Entresto 26-24 mg twice daily) as soon as possible  DM: Hgb A1c 8.6 Currently on SSI.  Metformin restarted Along with Jardiance and Januvia.  Jardiance indicated for diabetes and heart failure   OSA: CPAP -----------------------------------------------   Inpatient Medications    Scheduled Meds:  acetaminophen  1,000 mg Oral Q6H   Or   acetaminophen (TYLENOL) oral liquid 160 mg/5 mL  1,000 mg Per Tube Q6H   aspirin EC  325 mg Oral Daily   Or   aspirin  324 mg Per Tube Daily   atorvastatin  80 mg Oral Daily   bisacodyl  10 mg Oral Daily   Or   bisacodyl  10 mg Rectal Daily   carvedilol  6.25 mg Oral BID WC   Chlorhexidine Gluconate Cloth  6 each Topical Daily   colchicine  0.3 mg Oral BID   docusate sodium  200 mg Oral Daily   [START ON 11/24/2020] furosemide  40 mg Oral  Daily   insulin aspart  0-24 Units Subcutaneous TID WC & HS   metFORMIN  850 mg Oral BID WC   pantoprazole  40 mg Oral Daily   potassium chloride  20 mEq Oral BID   Continuous Infusions:   PRN Meds: dextrose, levalbuterol, metoprolol tartrate, ondansetron (ZOFRAN) IV, oxyCODONE, sodium chloride flush, sodium chloride flush, traMADol   Vital Signs    Vitals:   11/23/20 1124 11/23/20 1130 11/23/20 1230 11/23/20 1247  BP: 116/80 110/67 111/76 (!) 136/92  Pulse: 91 89 93 84  Resp: 16 20 12 17   Temp: 98.1 F (36.7  C)     TempSrc: Oral     SpO2: 92% 93% 91% 100%  Weight:      Height:        Intake/Output Summary (Last 24 hours) at 11/23/2020 1454 Last data filed at 11/23/2020 1400 Gross per 24 hour  Intake 840 ml  Output 4400 ml  Net -3560 ml   Last 3 Weights 11/23/2020 11/22/2020 11/21/2020  Weight (lbs) 190 lb 0.6 oz 197 lb 9.6 oz 193 lb 5.5 oz  Weight (kg) 86.2 kg 89.631 kg 87.7 kg      Telemetry    Sr - Personally Reviewed  ECG   N/A   Labs    High Sensitivity Troponin:  No results for input(s): TROPONINIHS in the last 720 hours.    Chemistry Recent Labs  Lab 11/20/20 0109 11/20/20 0755 11/21/20 1703 11/22/20 0402 11/23/20 0430  NA 136   < > 139 136 137  K 3.9   < > 4.3 4.4 3.9  CL 105   < > 107 106 105  CO2 24   < > 25 24 24   GLUCOSE 136*   < > 105* 163* 124*  BUN 20   < > 15 17 17   CREATININE 1.02   < > 0.97 0.94 0.83  CALCIUM 9.5   < > 8.5* 8.4* 8.5*  PROT 6.5  --   --   --   --   ALBUMIN 3.9  --   --   --   --   AST 58*  --   --   --   --   ALT 67*  --   --   --   --   ALKPHOS 40  --   --   --   --   BILITOT 1.1  --   --   --   --   GFRNONAA >60   < > >60 >60 >60  ANIONGAP 7   < > 7 6 8    < > = values in this interval not displayed.     Hematology Recent Labs  Lab 11/21/20 1703 11/22/20 0402 11/23/20 0527  WBC 11.0* 12.0* 11.5*  RBC 4.03* 4.04* 4.10*  HGB 13.3 13.2 13.5  HCT 39.1 39.6 39.3  MCV 97.0 98.0 95.9  MCH 33.0 32.7 32.9  MCHC 34.0 33.3 34.4  RDW 12.8 12.9 12.6  PLT 131* 137* 140*    BNPNo results for input(s): BNP, PROBNP in the last 168 hours.   DDimer No results for input(s): DDIMER in the last 168 hours.   Radiology    DG Chest 2 View  Result Date: 11/23/2020 CLINICAL DATA:  Sore throat status post open heart surgery a few days ago. Pleural effusion. EXAM: CHEST - 2 VIEW COMPARISON:  November 22, 2020. FINDINGS: Prior median sternotomy. Coronary artery stent. Right IJ sheath remains in place. Similar small left pleural effusion  with  overlying streaky left basilar opacities. Clear right lung. No visible pneumothorax. IMPRESSION: Similar small left pleural effusion with overlying streaky left basilar opacities, most likely atelectasis in the postoperative setting. Electronically Signed   By: Margaretha Sheffield MD   On: 11/23/2020 09:07   DG Chest 2 View  Result Date: 11/22/2020 CLINICAL DATA:  Recent CABG EXAM: CHEST - 2 VIEW COMPARISON:  11/22/2020 at 0517 hours FINDINGS: Prior median sternotomy. Stable cardiomediastinal contours. Right IJ sheath remains in place. Improving aeration of the left lung base with minimal residual left basilar atelectasis and small effusion. No pneumothorax. IMPRESSION: Improving aeration of the left lung base. Electronically Signed   By: Davina Poke D.O.   On: 11/22/2020 12:32   DG Chest Port 1 View  Result Date: 11/22/2020 CLINICAL DATA:  Recent CABG. EXAM: PORTABLE CHEST 1 VIEW COMPARISON:  Chest x-ray from yesterday. FINDINGS: Interval removal of the Swan-Ganz catheter. The right internal jugular sheath remains in place. Interval removal of the mediastinal drain and left chest tube. No pneumothorax. Small left pleural effusion with mild left basilar atelectasis. The right lung is clear. Stable cardiomediastinal silhouette. No acute osseous abnormality. IMPRESSION: 1. No pneumothorax. 2. Unchanged small left pleural effusion and left basilar atelectasis. Electronically Signed   By: Titus Dubin M.D.   On: 11/22/2020 08:59    Cardiac Studies   Cath: 11/15/20: Rec: 3 V CAD -> Admit for observation and cardiac surgery consultation for CABG (d/c Effient) , given worsening chest pain (including at rest prior to today's catheterization), reduced LVEF, and diabetes mellitus. Three-vessel coronary artery disease with 90% ostial/proximal LAD stenosis that appears to be within old stent though heavy calcification confounds visualization, long proximal through distal LCx disease of up to 70% that is  hemodynamically significant (RFR = 0.76), and 50% distal RCA stenosis that is borderline significant (RFR = 0.90). Right dominant Severely reduced left ventricular systolic function (LVEF 16-07%) with mildly elevated filling pressure (LVEDP ~20 mmHg).   Echo: 11/15/20 Moderately reduced LVEF of 30 to 35%.  Mid-apical anteroseptal and inferoseptal akinesis.  Apical anterior, lateral and inferior walls akinetic.  GR 1 DD.  Mild LA dilation.  Unable assess RVP but normal RV size and function.  Mild aortic valve sclerosis with no stenosis.  Normal CVP.   Assessment & Plan    Unstable angina: Underwent cardiac catheterization on 7/7 with three-vessel CAD, 90% ostial/proximal LAD within the old stent, along proximal third distal circumflex disease of 70% and 50% distal RCA.   -- Recommendation for TCTS for CABG.  He was evaluated with plans for surgery in the morning.   --Remains on IV heparin, aspirin, statin, Coreg, Imdur, losartan  Ischemic cardiomyopathy: Echocardiogram noted EF of 30 to 35% with mid to apical, anterior septal and inferior septal akinesis, grade 1 diastolic dysfunction. No overload present on exam. -- Continue Coreg 6.25 mg BID, losartan 25mg  daily -- plan for transition to [facility] prior to discharge   Hyperlipidemia: LDL 121 -- on high dose statin -- plan to add Zetia, suspect will need referral to lipid clinic at discharge   HTN: stable with coreg and losartan  DM: Hgb A1c 8.6 -- currently on SSI -- PTA meds include januvia, jardiance and metformin  OSA: on cpap  For questions or updates, please contact [facility] Please consult [email or web address] for contact info under        Signed, Glenetta Hew, MD  11/23/2020, 2:54 PM     Glenetta Hew, M.D., M.S. Interventional  Cardiologist   Pager # [phone] Phone # [phone] [address] [place], [place] [address]

## 2020-11-23 NOTE — Care Management Important Message (Signed)
Important Message  Patient Details  Name: [name] MRN: [id number] Date of Birth: [birth year]   Medicare Important Message Given:  Yes     [name] 11/23/2020, 8:53 AM

## 2020-11-23 NOTE — Progress Notes (Signed)
Central line DCd as ordered pressure held and dressing applied. Bp 136/92 heart rate 95. Flor Houdeshell, Bettina Gavia RN

## 2020-11-24 LAB — GLUCOSE, CAPILLARY: Glucose-Capillary: 161 mg/dL — ABNORMAL HIGH (ref 70–99)

## 2020-11-24 MED ORDER — POTASSIUM CHLORIDE CRYS ER 20 MEQ PO TBCR: 20.0000 meq | EXTENDED_RELEASE_TABLET | Freq: Two times a day (BID) | ORAL | 0 refills | Status: DC

## 2020-11-24 MED ORDER — FUROSEMIDE 40 MG PO TABS: 40.0000 mg | ORAL_TABLET | Freq: Every day | ORAL | 0 refills | Status: DC

## 2020-11-24 MED ORDER — COLCHICINE 0.6 MG PO TABS: 0.3000 mg | ORAL_TABLET | Freq: Two times a day (BID) | ORAL | 0 refills | Status: DC

## 2020-11-24 MED ORDER — TRAMADOL HCL 50 MG PO TABS: 50.0000 mg | ORAL_TABLET | Freq: Four times a day (QID) | ORAL | 0 refills | Status: DC | PRN

## 2020-11-24 NOTE — Progress Notes (Signed)
Chest tube sutures removed, per order. Sites clean and dry. Benzoin and steri strips applied.

## 2020-11-24 NOTE — Progress Notes (Signed)
[address]       [place] [address]             [phone]      4 Days Post-Op Procedure(s) (LRB): CORONARY ARTERY BYPASS GRAFTING (CABG) x  FIVE ON PUMP  USING LEFT INTERNAL MAMMARY ARTERTY AND LEFT ENDOSCOPIC GREATER SAPHENOUS VEIN CONDUITS, RIGHT LEG OPENED NOT HARVESTED (N/A) TRANSESOPHAGEAL ECHOCARDIOGRAM (TEE) (N/A) APPLICATION OF CELL SAVER Subjective: Conts to feel well  Objective: Vital signs in last 24 hours: Temp:  [98 F (36.7 C)-98.6 F (37 C)] 98.6 F (37 C) (07/16 0326) Pulse Rate:  [80-97] 80 (07/16 0326) Cardiac Rhythm: Normal sinus rhythm;Sinus tachycardia (07/15 1935) Resp:  [12-20] 17 (07/16 0326) BP: (110-136)/(67-92) 124/76 (07/16 0326) SpO2:  [90 %-100 %] 96 % (07/16 0326) Weight:  [86.8 kg] 86.8 kg (07/16 0648)  Hemodynamic parameters for last 24 hours:    Intake/Output from previous day: 07/15 0701 - 07/16 0700 In: 1080 [P.O.:1080] Out: 3600 [Urine:3600] Intake/Output this shift: No intake/output data recorded.  General appearance: alert, cooperative, and no distress Heart: regular rate and rhythm Lungs: min dim in bases Abdomen: benign Extremities: + edema left>right LE Wound: incis healing well  Lab Results: Recent Labs    11/22/20 0402 11/23/20 0527  WBC 12.0* 11.5*  HGB 13.2 13.5  HCT 39.6 39.3  PLT 137* 140*   BMET:  Recent Labs    11/22/20 0402 11/23/20 0430  NA 136 137  K 4.4 3.9  CL 106 105  CO2 24 24  GLUCOSE 163* 124*  BUN 17 17  CREATININE 0.94 0.83  CALCIUM 8.4* 8.5*    PT/INR: No results for input(s): LABPROT, INR in the last 72 hours. ABG    Component Value Date/Time   PHART 7.343 (L) 11/20/2020 1947   HCO3 21.8 11/20/2020 1947   TCO2 23 11/20/2020 1947   ACIDBASEDEF 4.0 (H) 11/20/2020 1947   O2SAT 95.0 11/20/2020 1947   CBG (last 3)  Recent Labs    11/23/20 1718 11/23/20 1958 11/24/20 0609  GLUCAP 204* 241* 161*    Meds Scheduled Meds:  acetaminophen  1,000 mg  Oral Q6H   Or   acetaminophen (TYLENOL) oral liquid 160 mg/5 mL  1,000 mg Per Tube Q6H   aspirin EC  325 mg Oral Daily   Or   aspirin  324 mg Per Tube Daily   atorvastatin  80 mg Oral Daily   bisacodyl  10 mg Oral Daily   Or   bisacodyl  10 mg Rectal Daily   carvedilol  6.25 mg Oral BID WC   Chlorhexidine Gluconate Cloth  6 each Topical Daily   colchicine  0.3 mg Oral BID   docusate sodium  200 mg Oral Daily   furosemide  40 mg Oral Daily   insulin aspart  0-24 Units Subcutaneous TID WC & HS   metFORMIN  850 mg Oral BID WC   pantoprazole  40 mg Oral Daily   potassium chloride  20 mEq Oral BID   Continuous Infusions: PRN Meds:.dextrose, levalbuterol, metoprolol tartrate, ondansetron (ZOFRAN) IV, oxyCODONE, sodium chloride flush, sodium chloride flush, traMADol  Xrays DG Chest 2 View  Result Date: 11/23/2020 CLINICAL DATA:  Sore throat status post open heart surgery a few days ago. Pleural effusion. EXAM: CHEST - 2 VIEW COMPARISON:  November 22, 2020. FINDINGS: Prior median sternotomy. Coronary artery stent. Right IJ sheath remains in place. Similar small left pleural effusion with overlying streaky left basilar opacities.  Clear right lung. No visible pneumothorax. IMPRESSION: Similar small left pleural effusion with overlying streaky left basilar opacities, most likely atelectasis in the postoperative setting. Electronically Signed   By: Margaretha Sheffield MD   On: 11/23/2020 09:07   DG Chest 2 View  Result Date: 11/22/2020 CLINICAL DATA:  Recent CABG EXAM: CHEST - 2 VIEW COMPARISON:  11/22/2020 at 0517 hours FINDINGS: Prior median sternotomy. Stable cardiomediastinal contours. Right IJ sheath remains in place. Improving aeration of the left lung base with minimal residual left basilar atelectasis and small effusion. No pneumothorax. IMPRESSION: Improving aeration of the left lung base. Electronically Signed   By: Davina Poke D.O.   On: 11/22/2020 12:32    Assessment/Plan: S/P  Procedure(s) (LRB): CORONARY ARTERY BYPASS GRAFTING (CABG) x  FIVE ON PUMP  USING LEFT INTERNAL MAMMARY ARTERTY AND LEFT ENDOSCOPIC GREATER SAPHENOUS VEIN CONDUITS, RIGHT LEG OPENED NOT HARVESTED (N/A) TRANSESOPHAGEAL ECHOCARDIOGRAM (TEE) (N/A) APPLICATION OF CELL SAVER  1 afeb, VSS, BP control is good,100's -120's-  will hold on losartan/entresto till outpatient - on coreg 2 sats good on RA 3 excellent UOP, cont lasix with EF 30-35 4 No new labs 5 DM fair control, will resume home meds at D/C- metformin, jardiance, januvia- will need good outpatient management/diet 6 appears stable for d/c  LOS: 8 days    John Giovanni PA-C Pager [phone] 11/24/2020

## 2020-11-24 NOTE — Progress Notes (Signed)
CARDIAC REHAB PHASE I   Pt seen ambulating in hallway, joined pt and completed d/c education. Pt ambulated >3031ft independently with steady gait. Pt educated on importance of site care and monitoring incision daily. Encouraged continued IS use, walks, and sternal precautions. Pt given in-the-tube sheet along with heart healthy and diabetic diets. Reviewed restrictions and exercise guidelines. Will refer to CRP II [place].   [phone] Rufina Falco, RN BSN 11/24/2020 8:58 AM

## 2020-11-26 ENCOUNTER — Telehealth: Payer: Self-pay

## 2020-11-26 ENCOUNTER — Other Ambulatory Visit: Payer: Self-pay | Admitting: *Deleted

## 2020-11-26 DIAGNOSIS — Z951 Presence of aortocoronary bypass graft: Secondary | ICD-10-CM

## 2020-11-26 NOTE — Telephone Encounter (Signed)
Transition Care Management Unsuccessful Follow-up Telephone Call  Date of discharge and from where:  11/24/20 from [facility]   Attempts:  1st Attempt  Reason for unsuccessful TCM follow-up call:  Left voice message

## 2020-11-27 DIAGNOSIS — Z48812 Encounter for surgical aftercare following surgery on the circulatory system: Secondary | ICD-10-CM | POA: Diagnosis not present

## 2020-11-27 DIAGNOSIS — I2510 Atherosclerotic heart disease of native coronary artery without angina pectoris: Secondary | ICD-10-CM | POA: Diagnosis not present

## 2020-11-27 DIAGNOSIS — Z951 Presence of aortocoronary bypass graft: Secondary | ICD-10-CM | POA: Diagnosis not present

## 2020-11-27 DIAGNOSIS — Z7984 Long term (current) use of oral hypoglycemic drugs: Secondary | ICD-10-CM | POA: Diagnosis not present

## 2020-11-27 DIAGNOSIS — I200 Unstable angina: Secondary | ICD-10-CM | POA: Diagnosis not present

## 2020-11-27 DIAGNOSIS — M6281 Muscle weakness (generalized): Secondary | ICD-10-CM | POA: Diagnosis not present

## 2020-11-27 DIAGNOSIS — E119 Type 2 diabetes mellitus without complications: Secondary | ICD-10-CM | POA: Diagnosis not present

## 2020-11-27 NOTE — Telephone Encounter (Signed)
Transition Care Management Follow-up Telephone Call Date of discharge and from where: 11/24/20 from [facility]. How have you been since you were released from the hospital? Doing really good. No sign of infection at site. HHRN in progress starting today. Given glucometer to monitor BS. Plans to begin regimen tomorrow. Good appetite. Pain from surgery managed with Tylenol. No longer taking Tramadol per patient preference. HH PT/OT scheduled next week.  Any questions or concerns? No  Items Reviewed: Did the pt receive and understand the discharge instructions provided? Yes  Medications obtained and verified? Yes  Any new allergies since your discharge? No  Dietary orders reviewed? Yes Do you have support at home? Yes   Functional Questionnaire: (I = Independent and D = Dependent) ADLs: I  Bathing/Dressing- I  Meal Prep- I  Eating- I  Maintaining continence- I  Transferring/Ambulation- I  Managing Meds- I  Follow up appointments reviewed:  PCP Hospital f/u appt confirmed? Yes  Scheduled to see Dr. Caryl Bis on 12/03/20. [facility] f/u appt confirmed? Yes  Scheduled to see Cardiology on 12/07/20.  Are transportation arrangements needed? No  If their condition worsens, is the pt aware to call PCP or go to the Emergency Dept.? Yes Was the patient provided with contact information for the PCP's office or ED? Yes Was to pt encouraged to call back with questions or concerns? Yes

## 2020-11-28 ENCOUNTER — Other Ambulatory Visit: Payer: Self-pay | Admitting: Cardiovascular Disease

## 2020-11-28 DIAGNOSIS — I2510 Atherosclerotic heart disease of native coronary artery without angina pectoris: Secondary | ICD-10-CM | POA: Diagnosis not present

## 2020-11-28 DIAGNOSIS — M6281 Muscle weakness (generalized): Secondary | ICD-10-CM | POA: Diagnosis not present

## 2020-11-28 DIAGNOSIS — Z48812 Encounter for surgical aftercare following surgery on the circulatory system: Secondary | ICD-10-CM | POA: Diagnosis not present

## 2020-11-28 DIAGNOSIS — Z7984 Long term (current) use of oral hypoglycemic drugs: Secondary | ICD-10-CM | POA: Diagnosis not present

## 2020-11-28 DIAGNOSIS — I200 Unstable angina: Secondary | ICD-10-CM | POA: Diagnosis not present

## 2020-11-28 DIAGNOSIS — I25119 Atherosclerotic heart disease of native coronary artery with unspecified angina pectoris: Secondary | ICD-10-CM

## 2020-11-28 DIAGNOSIS — E119 Type 2 diabetes mellitus without complications: Secondary | ICD-10-CM | POA: Diagnosis not present

## 2020-11-28 NOTE — Telephone Encounter (Signed)
Reviewed

## 2020-11-30 DIAGNOSIS — I2510 Atherosclerotic heart disease of native coronary artery without angina pectoris: Secondary | ICD-10-CM | POA: Diagnosis not present

## 2020-11-30 DIAGNOSIS — M6281 Muscle weakness (generalized): Secondary | ICD-10-CM | POA: Diagnosis not present

## 2020-11-30 DIAGNOSIS — Z7984 Long term (current) use of oral hypoglycemic drugs: Secondary | ICD-10-CM | POA: Diagnosis not present

## 2020-11-30 DIAGNOSIS — I200 Unstable angina: Secondary | ICD-10-CM | POA: Diagnosis not present

## 2020-11-30 DIAGNOSIS — E119 Type 2 diabetes mellitus without complications: Secondary | ICD-10-CM | POA: Diagnosis not present

## 2020-11-30 DIAGNOSIS — Z48812 Encounter for surgical aftercare following surgery on the circulatory system: Secondary | ICD-10-CM | POA: Diagnosis not present

## 2020-12-03 ENCOUNTER — Inpatient Hospital Stay: Payer: Medicare Other | Admitting: Family Medicine

## 2020-12-03 DIAGNOSIS — E119 Type 2 diabetes mellitus without complications: Secondary | ICD-10-CM | POA: Diagnosis not present

## 2020-12-03 DIAGNOSIS — M6281 Muscle weakness (generalized): Secondary | ICD-10-CM | POA: Diagnosis not present

## 2020-12-03 DIAGNOSIS — I200 Unstable angina: Secondary | ICD-10-CM | POA: Diagnosis not present

## 2020-12-03 DIAGNOSIS — Z48812 Encounter for surgical aftercare following surgery on the circulatory system: Secondary | ICD-10-CM | POA: Diagnosis not present

## 2020-12-03 DIAGNOSIS — Z7984 Long term (current) use of oral hypoglycemic drugs: Secondary | ICD-10-CM | POA: Diagnosis not present

## 2020-12-03 DIAGNOSIS — I2510 Atherosclerotic heart disease of native coronary artery without angina pectoris: Secondary | ICD-10-CM | POA: Diagnosis not present

## 2020-12-05 DIAGNOSIS — Z7984 Long term (current) use of oral hypoglycemic drugs: Secondary | ICD-10-CM | POA: Diagnosis not present

## 2020-12-05 DIAGNOSIS — I2510 Atherosclerotic heart disease of native coronary artery without angina pectoris: Secondary | ICD-10-CM | POA: Diagnosis not present

## 2020-12-05 DIAGNOSIS — E119 Type 2 diabetes mellitus without complications: Secondary | ICD-10-CM | POA: Diagnosis not present

## 2020-12-05 DIAGNOSIS — Z48812 Encounter for surgical aftercare following surgery on the circulatory system: Secondary | ICD-10-CM | POA: Diagnosis not present

## 2020-12-05 DIAGNOSIS — I200 Unstable angina: Secondary | ICD-10-CM | POA: Diagnosis not present

## 2020-12-05 DIAGNOSIS — M6281 Muscle weakness (generalized): Secondary | ICD-10-CM | POA: Diagnosis not present

## 2020-12-07 ENCOUNTER — Ambulatory Visit (INDEPENDENT_AMBULATORY_CARE_PROVIDER_SITE_OTHER): Payer: Medicare Other | Admitting: Cardiovascular Disease

## 2020-12-07 ENCOUNTER — Other Ambulatory Visit: Payer: Self-pay

## 2020-12-07 ENCOUNTER — Encounter: Payer: Self-pay | Admitting: Cardiovascular Disease

## 2020-12-07 VITALS — BP 120/60 | HR 96 | Ht 68.0 in | Wt 179.0 lb

## 2020-12-07 DIAGNOSIS — G4733 Obstructive sleep apnea (adult) (pediatric): Secondary | ICD-10-CM | POA: Diagnosis not present

## 2020-12-07 DIAGNOSIS — I255 Ischemic cardiomyopathy: Secondary | ICD-10-CM | POA: Diagnosis not present

## 2020-12-07 DIAGNOSIS — I200 Unstable angina: Secondary | ICD-10-CM | POA: Diagnosis not present

## 2020-12-07 DIAGNOSIS — I10 Essential (primary) hypertension: Secondary | ICD-10-CM | POA: Diagnosis not present

## 2020-12-07 DIAGNOSIS — M6281 Muscle weakness (generalized): Secondary | ICD-10-CM | POA: Diagnosis not present

## 2020-12-07 DIAGNOSIS — E785 Hyperlipidemia, unspecified: Secondary | ICD-10-CM

## 2020-12-07 DIAGNOSIS — Z48812 Encounter for surgical aftercare following surgery on the circulatory system: Secondary | ICD-10-CM | POA: Diagnosis not present

## 2020-12-07 DIAGNOSIS — Z951 Presence of aortocoronary bypass graft: Secondary | ICD-10-CM | POA: Diagnosis not present

## 2020-12-07 DIAGNOSIS — Z7984 Long term (current) use of oral hypoglycemic drugs: Secondary | ICD-10-CM | POA: Diagnosis not present

## 2020-12-07 DIAGNOSIS — I2510 Atherosclerotic heart disease of native coronary artery without angina pectoris: Secondary | ICD-10-CM | POA: Diagnosis not present

## 2020-12-07 DIAGNOSIS — I25118 Atherosclerotic heart disease of native coronary artery with other forms of angina pectoris: Secondary | ICD-10-CM | POA: Diagnosis not present

## 2020-12-07 DIAGNOSIS — E119 Type 2 diabetes mellitus without complications: Secondary | ICD-10-CM | POA: Diagnosis not present

## 2020-12-07 MED ORDER — EZETIMIBE 10 MG PO TABS: 10.0000 mg | ORAL_TABLET | Freq: Every day | ORAL | 3 refills | Status: DC
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
## 2020-12-07 NOTE — Patient Instructions (Addendum)
Medication Instructions:  - Your physician has recommended you make the following change in your medication:   1) Please start zetia 10 mg- take 1 tablet by mouth once daily  If you need a refill on your cardiac medications before your next appointment, please call your pharmacy.    Lab work: - Your physician recommends that you have lab work today: BMP   If you have labs (blood work) drawn today and your tests are completely normal, you will receive your results only by: [employer] (if you have MyChart) OR A paper copy in the mail If you have any lab test that is abnormal or we need to change your treatment, we will call you to review the results.   Testing/Procedures:  1) Cardiac Rehab:  You have been referred to cardiac rehab. This is a combination program including monitored exercise, dietary education, and support group. We strongly recommend participating in the program. Expect a phone call from them in approximately 1-2 weeks. If you do not hear from them, the phone number for cardiac rehab at [facility] is [phone].     Follow-Up: At [facility], you and your health needs are our priority.  As part of our continuing mission to provide you with exceptional heart care, we have created designated Provider Care Teams.  These Care Teams include your primary Cardiologist (physician) and Advanced Practice Providers (APPs -  Physician Assistants and Nurse Practitioners) who all work together to provide you with the care you need, when you need it.  You will need a follow up appointment in 3 months  Providers on your designated Care Team:   Murray Hodgkins, NP Christell Faith, PA-C Marrianne Mood, PA-C Cadence Kathlen Mody, [place]  Any Other Special Instructions Will Be Listed Below (If Applicable).  COVID-19 Vaccine Information can be found at: ShippingScam.co.uk For questions related to vaccine distribution or appointments,  please email [email or web address] or call [phone].

## 2020-12-08 LAB — BASIC METABOLIC PANEL
BUN/Creatinine Ratio: 19 (ref 10–24)
BUN: 20 mg/dL (ref 8–27)
CO2: 24 mmol/L (ref 20–29)
Calcium: 10.2 mg/dL (ref 8.6–10.2)
Chloride: 98 mmol/L (ref 96–106)
Creatinine, Ser: 1.07 mg/dL (ref 0.76–1.27)
Glucose: 186 mg/dL — ABNORMAL HIGH (ref 65–99)
Potassium: 4.1 mmol/L (ref 3.5–5.2)
Sodium: 142 mmol/L (ref 134–144)
eGFR: 75 mL/min/{1.73_m2} (ref >59)

## 2020-12-11 ENCOUNTER — Ambulatory Visit (INDEPENDENT_AMBULATORY_CARE_PROVIDER_SITE_OTHER): Payer: Medicare Other | Admitting: Family Medicine

## 2020-12-11 ENCOUNTER — Encounter: Payer: Self-pay | Admitting: Family Medicine

## 2020-12-11 ENCOUNTER — Other Ambulatory Visit: Payer: Self-pay

## 2020-12-11 VITALS — BP 110/70 | HR 73 | Temp 97.0°F | Ht 68.0 in | Wt 182.6 lb

## 2020-12-11 DIAGNOSIS — F419 Anxiety disorder, unspecified: Secondary | ICD-10-CM | POA: Diagnosis not present

## 2020-12-11 DIAGNOSIS — I200 Unstable angina: Secondary | ICD-10-CM | POA: Diagnosis not present

## 2020-12-11 DIAGNOSIS — I10 Essential (primary) hypertension: Secondary | ICD-10-CM

## 2020-12-11 DIAGNOSIS — Z951 Presence of aortocoronary bypass graft: Secondary | ICD-10-CM

## 2020-12-11 DIAGNOSIS — Z48812 Encounter for surgical aftercare following surgery on the circulatory system: Secondary | ICD-10-CM | POA: Diagnosis not present

## 2020-12-11 DIAGNOSIS — E1159 Type 2 diabetes mellitus with other circulatory complications: Secondary | ICD-10-CM | POA: Diagnosis not present

## 2020-12-11 DIAGNOSIS — E119 Type 2 diabetes mellitus without complications: Secondary | ICD-10-CM | POA: Diagnosis not present

## 2020-12-11 DIAGNOSIS — E1165 Type 2 diabetes mellitus with hyperglycemia: Secondary | ICD-10-CM | POA: Diagnosis not present

## 2020-12-11 DIAGNOSIS — M6281 Muscle weakness (generalized): Secondary | ICD-10-CM | POA: Diagnosis not present

## 2020-12-11 DIAGNOSIS — I2510 Atherosclerotic heart disease of native coronary artery without angina pectoris: Secondary | ICD-10-CM | POA: Diagnosis not present

## 2020-12-11 DIAGNOSIS — Z7984 Long term (current) use of oral hypoglycemic drugs: Secondary | ICD-10-CM | POA: Diagnosis not present

## 2020-12-11 LAB — MICROALBUMIN / CREATININE URINE RATIO
Creatinine,U: 45.2 mg/dL
Microalb Creat Ratio: 1.5 mg/g (ref 0.0–30.0)
Microalb, Ur: <0.7 mg/dL (ref 0.0–1.9)

## 2020-12-11 MED ORDER — TRULICITY 0.75 MG/0.5ML ~~LOC~~ SOAJ: 0.7500 mg | SUBCUTANEOUS | 1 refills | Status: DC

## 2020-12-11 NOTE — Progress Notes (Signed)
[name], MD Phone: [phone]  [name] is a 70 y.o. male who presents today for follow-up.  Anxiety: Patient notes this has increased since he had his open heart surgery.  He started himself on CBD and notes that has been beneficial.  He notes that has smoothed out the emotional roller coaster.  He notes no SI or HI.  CAD status post CABG: Patient reports he is doing relatively well.  He notes no chest pain or shortness of breath.  He has followed up with cardiology.  Cardiology recently started him on Zetia though the patient notes he has not received that yet.  He continues on carvedilol, Lasix, and Imdur.  Diabetes: His A1c was elevated in the hospital.  Typically his glucose has been around 160.  He has been on Januvia, Jardiance, and metformin.  He has had some polyuria with the Lasix.  No hypoglycemia since getting out of the hospital.  Notes he is due to see ophthalmology.  Social History   Tobacco Use  Smoking Status Former   Types: Cigarettes   Quit date: 09/14/2007   Years since quitting: 13.2  Smokeless Tobacco Never  Tobacco Comments   quit 2009    Current Outpatient Medications on File Prior to Visit  Medication Sig Dispense Refill   aspirin EC 325 MG EC tablet Take 1 tablet (325 mg total) by mouth daily. 30 tablet 0   atorvastatin (LIPITOR) 80 MG tablet TAKE 1 TABLET DAILY 90 tablet 0   carvedilol (COREG) 6.25 MG tablet TAKE 1 TABLET TWICE A DAY WITH MEALS 180 tablet 2   colchicine 0.6 MG tablet Take 0.5 tablets (0.3 mg total) by mouth 2 (two) times daily. 30 tablet 0   diphenhydramine-acetaminophen (TYLENOL PM) 25-500 MG TABS tablet Take 2 tablets by mouth at bedtime.     ezetimibe (ZETIA) 10 MG tablet Take 1 tablet (10 mg total) by mouth daily. 90 tablet 3   furosemide (LASIX) 40 MG tablet Take 1 tablet (40 mg total) by mouth daily. 30 tablet 0   isosorbide mononitrate (IMDUR) 30 MG 24 hr tablet Take 1 tablet (30 mg total) by mouth daily. 90  tablet 3   JARDIANCE 25 MG TABS tablet TAKE 1 TABLET DAILY 90 tablet 3   metFORMIN (GLUCOPHAGE) 1000 MG tablet TAKE 1 TABLET TWICE A DAY WITH A MEAL 180 tablet 3   Multiple Vitamins-Minerals (MULTIVITAMINS THER. W/MINERALS) TABS Take 1 tablet by mouth every morning.     niacin (NIASPAN) 1000 MG CR tablet TAKE 2 TABLETS DAILY AT BEDTIME 180 tablet 2   potassium chloride SA (KLOR-CON) 20 MEQ tablet Take 1 tablet (20 mEq total) by mouth 2 (two) times daily. 30 tablet 0   traMADol (ULTRAM) 50 MG tablet Take 1 tablet (50 mg total) by mouth every 6 (six) hours as needed for moderate pain. 28 tablet 0   No current facility-administered medications on file prior to visit.     ROS see history of present illness  Objective  Physical Exam Vitals:   12/11/20 1150  BP: 110/70  Pulse: 73  Temp: (!) 97 F (36.1 C)  SpO2: 97%    BP Readings from Last 3 Encounters:  12/11/20 110/70  12/07/20 120/60  11/24/20 121/70   Wt Readings from Last 3 Encounters:  12/11/20 182 lb 9.6 oz (82.8 kg)  12/07/20 179 lb (81.2 kg)  11/24/20 191 lb 5.8 oz (86.8 kg)    Physical Exam Constitutional:      General: He  is not in acute distress.    Appearance: He is not diaphoretic.  Cardiovascular:     Rate and Rhythm: Normal rate and regular rhythm.     Heart sounds: Normal heart sounds.  Pulmonary:     Effort: Pulmonary effort is normal.     Breath sounds: Normal breath sounds.  Skin:    General: Skin is warm and dry.     Comments: Well-healing vein harvest site in his left lower leg, sternotomy scar appears to be well-healing as well  Neurological:     Mental Status: He is alert.     Assessment/Plan: Please see individual problem list.  Problem List Items Addressed This Visit     HTN (hypertension) (Chronic)    Well-controlled.  He will continue Imdur 30 mg once daily, Lasix 40 mg once daily, carvedilol 6.25 mg twice daily.       Anxiety    New issue.  This seems to have improved with CBD.   I discussed that CBD is not a regulated substance so there is no way to tell exactly what is in it.  Discussed we do not know how this type of substance will interact with his medications.  I discussed that there are risks to using it though he wants to continue with this.  He does not want prescription medication to help with his anxiety at this time.  He will monitor for now.       S/P CABG x 5    Overall doing well.  He will continue carvedilol, Lasix, Imdur, and Lipitor.  He will start on Zetia when he receives it from his mail order pharmacy.       Type 2 diabetes mellitus, uncontrolled ([place]) - Primary    We will start him on Trulicity [id number] mg once weekly.  He notes no personal or family history of thyroid cancer, parathyroid cancer, or adrenal gland cancer.  He notes no history of pancreatitis or gallbladder disease.  Discussed medullary thyroid cancer being seen in rat studies though advised this does not seem to be an issue in humans.  He will monitor his thyroid area and if he develops any changes he will let us know.  If he develops any abdominal pain he will let us know.  Discussed risk of nausea with this medicine as well.  He will continue on Jardiance 25 mg once daily and metformin 1000 mg twice daily.       Relevant Medications   Dulaglutide (TRULICITY) A999333 0000000 SOPN     Return in about 3 months (around 03/13/2021) for Diabetes.  This visit occurred during the SARS-CoV-2 public health emergency.  Safety protocols were in place, including screening questions prior to the visit, additional usage of staff PPE, and extensive cleaning of exam room while observing appropriate contact time as indicated for disinfecting solutions.    [name], MD [place]

## 2020-12-11 NOTE — Assessment & Plan Note (Addendum)
Well-controlled.  He will continue Imdur 30 mg once daily, Lasix 40 mg once daily, carvedilol 6.25 mg twice daily.

## 2020-12-11 NOTE — Patient Instructions (Addendum)
Nice to see you. We will start you on Trulicity for your diabetes. Please start on the Zetia that Dr. Rockey Situ sent to your pharmacy.

## 2020-12-11 NOTE — Assessment & Plan Note (Signed)
New issue.  This seems to have improved with CBD.  I discussed that CBD is not a regulated substance so there is no way to tell exactly what is in it.  Discussed we do not know how this type of substance will interact with his medications.  I discussed that there are risks to using it though he wants to continue with this.  He does not want prescription medication to help with his anxiety at this time.  He will monitor for now.

## 2020-12-11 NOTE — Assessment & Plan Note (Signed)
Overall doing well.  He will continue carvedilol, Lasix, Imdur, and Lipitor.  He will start on Zetia when he receives it from his mail order pharmacy.

## 2020-12-11 NOTE — Assessment & Plan Note (Signed)
We will start him on Trulicity [id number] mg once weekly.  He notes no personal or family history of thyroid cancer, parathyroid cancer, or adrenal gland cancer.  He notes no history of pancreatitis or gallbladder disease.  Discussed medullary thyroid cancer being seen in rat studies though advised this does not seem to be an issue in humans.  He will monitor his thyroid area and if he develops any changes he will let us know.  If he develops any abdominal pain he will let us know.  Discussed risk of nausea with this medicine as well.  He will continue on Jardiance 25 mg once daily and metformin 1000 mg twice daily.

## 2020-12-12 DIAGNOSIS — Z7984 Long term (current) use of oral hypoglycemic drugs: Secondary | ICD-10-CM | POA: Diagnosis not present

## 2020-12-12 DIAGNOSIS — Z48812 Encounter for surgical aftercare following surgery on the circulatory system: Secondary | ICD-10-CM | POA: Diagnosis not present

## 2020-12-12 DIAGNOSIS — I2510 Atherosclerotic heart disease of native coronary artery without angina pectoris: Secondary | ICD-10-CM | POA: Diagnosis not present

## 2020-12-12 DIAGNOSIS — I200 Unstable angina: Secondary | ICD-10-CM | POA: Diagnosis not present

## 2020-12-12 DIAGNOSIS — E119 Type 2 diabetes mellitus without complications: Secondary | ICD-10-CM | POA: Diagnosis not present

## 2020-12-12 DIAGNOSIS — M6281 Muscle weakness (generalized): Secondary | ICD-10-CM | POA: Diagnosis not present

## 2020-12-14 ENCOUNTER — Other Ambulatory Visit: Payer: Self-pay | Admitting: *Deleted

## 2020-12-14 DIAGNOSIS — Z48812 Encounter for surgical aftercare following surgery on the circulatory system: Secondary | ICD-10-CM | POA: Diagnosis not present

## 2020-12-14 DIAGNOSIS — E119 Type 2 diabetes mellitus without complications: Secondary | ICD-10-CM | POA: Diagnosis not present

## 2020-12-14 DIAGNOSIS — M6281 Muscle weakness (generalized): Secondary | ICD-10-CM | POA: Diagnosis not present

## 2020-12-14 DIAGNOSIS — I200 Unstable angina: Secondary | ICD-10-CM | POA: Diagnosis not present

## 2020-12-14 DIAGNOSIS — Z7984 Long term (current) use of oral hypoglycemic drugs: Secondary | ICD-10-CM | POA: Diagnosis not present

## 2020-12-14 DIAGNOSIS — I2510 Atherosclerotic heart disease of native coronary artery without angina pectoris: Secondary | ICD-10-CM | POA: Diagnosis not present

## 2020-12-14 MED ORDER — ISOSORBIDE MONONITRATE ER 30 MG PO TB24: 30.0000 mg | ORAL_TABLET | Freq: Every day | ORAL | 0 refills | Status: DC

## 2020-12-17 ENCOUNTER — Telehealth: Payer: Self-pay | Admitting: *Deleted

## 2020-12-17 ENCOUNTER — Other Ambulatory Visit: Payer: Self-pay

## 2020-12-17 DIAGNOSIS — E119 Type 2 diabetes mellitus without complications: Secondary | ICD-10-CM | POA: Diagnosis not present

## 2020-12-17 DIAGNOSIS — Z7984 Long term (current) use of oral hypoglycemic drugs: Secondary | ICD-10-CM | POA: Diagnosis not present

## 2020-12-17 DIAGNOSIS — I2510 Atherosclerotic heart disease of native coronary artery without angina pectoris: Secondary | ICD-10-CM | POA: Diagnosis not present

## 2020-12-17 DIAGNOSIS — M6281 Muscle weakness (generalized): Secondary | ICD-10-CM | POA: Diagnosis not present

## 2020-12-17 DIAGNOSIS — I200 Unstable angina: Secondary | ICD-10-CM | POA: Diagnosis not present

## 2020-12-17 DIAGNOSIS — Z48812 Encounter for surgical aftercare following surgery on the circulatory system: Secondary | ICD-10-CM | POA: Diagnosis not present

## 2020-12-17 MED ORDER — POTASSIUM CHLORIDE CRYS ER 20 MEQ PO TBCR: 20.0000 meq | EXTENDED_RELEASE_TABLET | Freq: Two times a day (BID) | ORAL | 0 refills | Status: DC

## 2020-12-17 MED ORDER — FUROSEMIDE 40 MG PO TABS: 40.0000 mg | ORAL_TABLET | Freq: Every day | ORAL | 0 refills | Status: DC

## 2020-12-17 NOTE — Telephone Encounter (Signed)
-----   Message from Minna Merritts, MD sent at 12/17/2020  1:14 PM EDT ----- Stable renal function, though leaning towards mild dehydration Would stay on current medications, skip lasix and potassium one day a week

## 2020-12-17 NOTE — Telephone Encounter (Signed)
Left voicemail message to call back for review of results and recommendations.  

## 2020-12-18 DIAGNOSIS — E119 Type 2 diabetes mellitus without complications: Secondary | ICD-10-CM | POA: Diagnosis not present

## 2020-12-18 DIAGNOSIS — Z48812 Encounter for surgical aftercare following surgery on the circulatory system: Secondary | ICD-10-CM | POA: Diagnosis not present

## 2020-12-18 DIAGNOSIS — M6281 Muscle weakness (generalized): Secondary | ICD-10-CM | POA: Diagnosis not present

## 2020-12-18 DIAGNOSIS — I200 Unstable angina: Secondary | ICD-10-CM | POA: Diagnosis not present

## 2020-12-18 DIAGNOSIS — I2510 Atherosclerotic heart disease of native coronary artery without angina pectoris: Secondary | ICD-10-CM | POA: Diagnosis not present

## 2020-12-18 DIAGNOSIS — Z7984 Long term (current) use of oral hypoglycemic drugs: Secondary | ICD-10-CM | POA: Diagnosis not present

## 2020-12-19 ENCOUNTER — Telehealth (HOSPITAL_COMMUNITY): Payer: Self-pay

## 2020-12-19 NOTE — Telephone Encounter (Signed)
Returned pt phone call, pt wanted to know about scheduling with cardiac rehab. I advised pt that [facility] has has his cardiac rehab referral. Pt was confused of why they had his referral, told pt that he was closer to [facility] but if he wanted to come to [facility] for his cardiac rehab that he could. I advised pt that we have a 1-3 month backlog and that hopefully we could get him in sooner than later. Pt stated that he would call [facility] to see where they are scheduling and he would get back with [place].

## 2020-12-20 DIAGNOSIS — I2510 Atherosclerotic heart disease of native coronary artery without angina pectoris: Secondary | ICD-10-CM | POA: Diagnosis not present

## 2020-12-20 DIAGNOSIS — M6281 Muscle weakness (generalized): Secondary | ICD-10-CM | POA: Diagnosis not present

## 2020-12-20 DIAGNOSIS — I200 Unstable angina: Secondary | ICD-10-CM | POA: Diagnosis not present

## 2020-12-20 DIAGNOSIS — E119 Type 2 diabetes mellitus without complications: Secondary | ICD-10-CM | POA: Diagnosis not present

## 2020-12-20 DIAGNOSIS — Z7984 Long term (current) use of oral hypoglycemic drugs: Secondary | ICD-10-CM | POA: Diagnosis not present

## 2020-12-20 DIAGNOSIS — Z48812 Encounter for surgical aftercare following surgery on the circulatory system: Secondary | ICD-10-CM | POA: Diagnosis not present

## 2020-12-20 NOTE — Telephone Encounter (Signed)
Able to reach pt regarding his recent lab work, Dr. Rockey Situ had a chance to review his results and advised   "Stable renal function, though leaning towards mild dehydration  Would stay on current medications, skip lasix and potassium one day a week"  [name] verbalized understanding, thankful for the phone call and will skip his lasix and potassium at least one day a week. All questions or concerns were address and no additional concerns at this time. Agreeable to plan, will call back for anything further.

## 2020-12-21 ENCOUNTER — Other Ambulatory Visit: Payer: Self-pay | Admitting: Cardiothoracic Surgery

## 2020-12-21 DIAGNOSIS — Z951 Presence of aortocoronary bypass graft: Secondary | ICD-10-CM

## 2020-12-24 ENCOUNTER — Other Ambulatory Visit: Payer: Self-pay

## 2020-12-24 ENCOUNTER — Ambulatory Visit
Admission: RE | Admit: 2020-12-24 | Discharge: 2020-12-24 | Disposition: A | Discharge status: Home / Self Care | Payer: Medicare Other | Source: Ambulatory Visit | Attending: Surgery | Admitting: Surgery

## 2020-12-24 ENCOUNTER — Ambulatory Visit (INDEPENDENT_AMBULATORY_CARE_PROVIDER_SITE_OTHER): Payer: Self-pay | Admitting: Physician Assistant

## 2020-12-24 VITALS — BP 118/72 | HR 93 | Resp 20 | Ht 68.0 in | Wt 178.9 lb

## 2020-12-24 DIAGNOSIS — Z951 Presence of aortocoronary bypass graft: Secondary | ICD-10-CM

## 2020-12-24 NOTE — Patient Instructions (Signed)
Keep lower extremities elevated when not ambulatory until wounds are healed.  No changes in medications.  May resume driving.  Observe sternal precautions for another 4 weeks.

## 2020-12-24 NOTE — Progress Notes (Signed)
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 The patient's early postoperative recovery while in the hospital was uncomplicated.  He did not have any significant arrhythmias.  He was weaned from oxygen without difficulty. He was recently seen by his cardiologist, Dr. Rockey Situ, for scheduled follow-up.  No medication changes were made at that time except Zetia was added to his current Lipitor dosing.. Since hospital discharge the patient reports steady progress with his recovery.  He denies any chest pain or shortness of breath.  He does have some minor soreness that he says is well controlled with acetaminophen.    Current Outpatient Medications  Medication Sig Dispense Refill   aspirin EC 325 MG EC tablet Take 1 tablet (325 mg total) by mouth daily. 30 tablet 0   atorvastatin (LIPITOR) 80 MG tablet TAKE 1 TABLET DAILY 90 tablet 0   carvedilol (COREG) 6.25 MG tablet TAKE 1 TABLET TWICE A DAY WITH MEALS 180 tablet 2   colchicine 0.6 MG tablet Take 0.5 tablets (0.3 mg total) by mouth 2 (two) times daily. 30 tablet 0   diphenhydramine-acetaminophen (TYLENOL PM) 25-500 MG TABS tablet Take 2 tablets by mouth at bedtime.     Dulaglutide (TRULICITY) A999333 0000000 SOPN Inject 0.75 mg into the skin once a week. 6 mL 1   ezetimibe (ZETIA) 10 MG tablet Take 1 tablet (10 mg total) by mouth daily. 90 tablet 3   furosemide (LASIX) 40 MG tablet Take 1 tablet (40 mg total) by mouth daily. 90 tablet 0   isosorbide mononitrate (IMDUR) 30 MG 24 hr tablet Take 1 tablet (30 mg total) by mouth daily. 90 tablet 0   JARDIANCE 25 MG TABS tablet TAKE 1 TABLET DAILY 90 tablet 3   metFORMIN (GLUCOPHAGE) 1000 MG tablet TAKE 1 TABLET TWICE A DAY WITH A MEAL 180 tablet 3   Multiple Vitamins-Minerals (MULTIVITAMINS THER. W/MINERALS) TABS Take 1 tablet by mouth every morning.     niacin (NIASPAN) 1000 MG CR tablet TAKE 2 TABLETS DAILY AT BEDTIME 180 tablet 2    potassium chloride SA (KLOR-CON) 20 MEQ tablet Take 1 tablet (20 mEq total) by mouth 2 (two) times daily. 180 tablet 0   traMADol (ULTRAM) 50 MG tablet Take 1 tablet (50 mg total) by mouth every 6 (six) hours as needed for moderate pain. 28 tablet 0   No current facility-administered medications for this visit.    Physical Exam: BP 118/72 Pulse 93 Respirations 20 SPO2 95%  Heart-regular rate and rhythm, no murmur or rub. Chest-breath sounds are full and equal, clear to auscultation throughout.  Chest x-ray shows no complicating features. Incisions-sternotomy incision is well-healed.  Sternum is stable.  The right thigh incision is intact and dry.  He has a few skip incisions in his left lower leg that are scabbed over.  Remnants of suture material protruding through 2 of these.  These suture remnants were trimmed away.  The scabs are intact and dry with minimal surrounding erythema and no evidence of drainage.  Diagnostic Tests: CLINICAL DATA:  S/P CABGx5s/p cabg x 5   EXAM: CHEST - 2 VIEW   COMPARISON:  None.   FINDINGS: Sternotomy wires overlie normal cardiac silhouette. Normal pulmonary vasculature. No effusion, infiltrate, or pneumothorax. No acute osseous abnormality.   IMPRESSION: No acute cardiopulmonary process.     Electronically Signed   By: Suzy Bouchard M.D.   On: 12/24/2020 11:57  Impression / Plan: Mr. Brietzke is making  satisfactory progress following CABG x5 about 1 month ago.  He is gradually increasing his activity and is walking about 2 miles on a daily basis.  The left leg incisions are healing slowly and I think this is most related to the chronic edema.  He is on daily Lasix 40 mg by mouth.  This is being managed by his cardiologist.  I advised him to keep his lower extremities elevated as much as possible when not ambulatory.  I see no indication for starting antibiotics or debriding these sites. He is asked to follow-up with [place] as needed.  I certainly  would be happy to see him in weeks if he feels the leg incisions are not continuing to progress.   Antony Odea, PA-C [facility] [phone]

## 2020-12-25 DIAGNOSIS — Z48812 Encounter for surgical aftercare following surgery on the circulatory system: Secondary | ICD-10-CM | POA: Diagnosis not present

## 2020-12-25 DIAGNOSIS — E119 Type 2 diabetes mellitus without complications: Secondary | ICD-10-CM | POA: Diagnosis not present

## 2020-12-25 DIAGNOSIS — I200 Unstable angina: Secondary | ICD-10-CM | POA: Diagnosis not present

## 2020-12-25 DIAGNOSIS — M6281 Muscle weakness (generalized): Secondary | ICD-10-CM | POA: Diagnosis not present

## 2020-12-25 DIAGNOSIS — I2510 Atherosclerotic heart disease of native coronary artery without angina pectoris: Secondary | ICD-10-CM | POA: Diagnosis not present

## 2020-12-25 DIAGNOSIS — Z7984 Long term (current) use of oral hypoglycemic drugs: Secondary | ICD-10-CM | POA: Diagnosis not present

## 2020-12-26 ENCOUNTER — Other Ambulatory Visit: Payer: Self-pay

## 2020-12-26 ENCOUNTER — Encounter: Payer: Medicare Other | Attending: Cardiovascular Disease

## 2020-12-26 DIAGNOSIS — Z951 Presence of aortocoronary bypass graft: Secondary | ICD-10-CM | POA: Insufficient documentation

## 2020-12-26 DIAGNOSIS — Z48812 Encounter for surgical aftercare following surgery on the circulatory system: Secondary | ICD-10-CM | POA: Insufficient documentation

## 2020-12-26 DIAGNOSIS — Z7984 Long term (current) use of oral hypoglycemic drugs: Secondary | ICD-10-CM | POA: Insufficient documentation

## 2020-12-26 NOTE — Progress Notes (Signed)
Virtual Visit completed. Patient informed on EP and RD appointment and 6 Minute walk test. Patient also informed of patient health questionnaires on My Chart. Patient Verbalizes understanding. Visit diagnosis can be found in [facility] 11/15/2020.

## 2020-12-27 ENCOUNTER — Telehealth: Payer: Self-pay

## 2020-12-27 NOTE — Telephone Encounter (Signed)
He should contact [facility] in [place] to determine if a refill is appropriate.  They prescribed this following his recent open heart surgery and they should be the ones to manage this.

## 2020-12-28 NOTE — Telephone Encounter (Signed)
I called and spoke with the patients wife, the patient was asleep, I informed her that we received a request for colchicine and the cardiac and thoracic surgeon put the patients on this medication, because of the surgery and I informed her to have the patient call them to see if he should be taking this medication and if so her needs for them to manage, she wrote down everything and will have the patient cal them to get this straightened out.  She will return a call back. Sharlon Pfohl,cma

## 2020-12-31 ENCOUNTER — Encounter: Payer: Medicare Other | Admitting: *Deleted

## 2020-12-31 ENCOUNTER — Other Ambulatory Visit: Payer: Self-pay

## 2020-12-31 VITALS — Ht 68.6 in | Wt 184.0 lb

## 2020-12-31 DIAGNOSIS — Z48812 Encounter for surgical aftercare following surgery on the circulatory system: Secondary | ICD-10-CM | POA: Diagnosis not present

## 2020-12-31 DIAGNOSIS — Z951 Presence of aortocoronary bypass graft: Secondary | ICD-10-CM

## 2020-12-31 DIAGNOSIS — Z7984 Long term (current) use of oral hypoglycemic drugs: Secondary | ICD-10-CM | POA: Diagnosis not present

## 2020-12-31 NOTE — Patient Instructions (Signed)
Patient Instructions  Patient Details  Name: [name] MRN: [id number] Date of Birth: 1950-11-19 Referring Provider:  Minna Merritts, MD  Below are your personal goals for exercise, nutrition, and risk factors. Our goal is to help you stay on track towards obtaining and maintaining these goals. We will be discussing your progress on these goals with you throughout the program.  Initial Exercise Prescription:  Initial Exercise Prescription - 12/31/20 1100       Date of Initial Exercise RX and Referring Provider   Date 12/31/20    Referring Provider [name] MD      Treadmill   MPH 2.7    Grade 1    Minutes 15    METs 3.44      NuStep   Level 3    SPM 80    Minutes 15    METs 3.3      Elliptical   Level 1    Speed 3    Minutes 15    METs 3      Prescription Details   Frequency (times per week) 2    Duration Progress to 30 minutes of continuous aerobic without signs/symptoms of physical distress      Intensity   THRR 40-80% of Max Heartrate 110-137    Ratings of Perceived Exertion 11-13    Perceived Dyspnea 0-4      Progression   Progression Continue to progress workloads to maintain intensity without signs/symptoms of physical distress.      Resistance Training   Training Prescription Yes    Weight 4 lb    Reps 10-15             Exercise Goals: Frequency: Be able to perform aerobic exercise two to three times per week in program working toward 2-5 days per week of home exercise.  Intensity: Work with a perceived exertion of 11 (fairly light) - 15 (hard) while following your exercise prescription.  We will make changes to your prescription with you as you progress through the program.   Duration: Be able to do 30 to 45 minutes of continuous aerobic exercise in addition to a 5 minute warm-up and a 5 minute cool-down routine.   Nutrition Goals: Your personal nutrition goals will be established when you do your nutrition analysis with  the dietician.  The following are general nutrition guidelines to follow: Cholesterol < '200mg'$ /day Sodium < '1500mg'$ /day Fiber: Men over 50 yrs - 30 grams per day  Personal Goals:  Personal Goals and Risk Factors at Admission - 12/31/20 1200       Core Components/Risk Factors/Patient Goals on Admission    Weight Management Yes;Weight Loss    Intervention Weight Management: Develop a combined nutrition and exercise program designed to reach desired caloric intake, while maintaining appropriate intake of nutrient and fiber, sodium and fats, and appropriate energy expenditure required for the weight goal.;Weight Management: Provide education and appropriate resources to help participant work on and attain dietary goals.;Weight Management/Obesity: Establish reasonable short term and long term weight goals.    Admit Weight 184 lb (83.5 kg)    Goal Weight: Short Term 180 lb (81.6 kg)    Goal Weight: Long Term 174 lb (78.9 kg)    Expected Outcomes Short Term: Continue to assess and modify interventions until short term weight is achieved;Long Term: Adherence to nutrition and physical activity/exercise program aimed toward attainment of established weight goal;Weight Loss: Understanding of general recommendations for a balanced deficit meal plan, which promotes  1-2 lb weight loss per week and includes a negative energy balance of [phone] kcal/d;Understanding recommendations for meals to include 15-35% energy as protein, 25-35% energy from fat, 35-60% energy from carbohydrates, less than '200mg'$  of dietary cholesterol, 20-35 gm of total fiber daily;Understanding of distribution of calorie intake throughout the day with the consumption of 4-5 meals/snacks    Diabetes Yes    Intervention Provide education about signs/symptoms and action to take for hypo/hyperglycemia.;Provide education about proper nutrition, including hydration, and aerobic/resistive exercise prescription along with prescribed medications to  achieve blood glucose in normal ranges: Fasting glucose 65-99 mg/dL    Expected Outcomes Long Term: Attainment of HbA1C < 7%.;Short Term: Participant verbalizes understanding of the signs/symptoms and immediate care of hyper/hypoglycemia, proper foot care and importance of medication, aerobic/resistive exercise and nutrition plan for blood glucose control.    Heart Failure Yes    Intervention Provide a combined exercise and nutrition program that is supplemented with education, support and counseling about heart failure. Directed toward relieving symptoms such as shortness of breath, decreased exercise tolerance, and extremity edema.    Expected Outcomes Improve functional capacity of life;Short term: Attendance in program 2-3 days a week with increased exercise capacity. Reported lower sodium intake. Reported increased fruit and vegetable intake. Reports medication compliance.;Short term: Daily weights obtained and reported for increase. Utilizing diuretic protocols set by physician.;Long term: Adoption of self-care skills and reduction of barriers for early signs and symptoms recognition and intervention leading to self-care maintenance.    Hypertension Yes    Intervention Provide education on lifestyle modifcations including regular physical activity/exercise, weight management, moderate sodium restriction and increased consumption of fresh fruit, vegetables, and low fat dairy, alcohol moderation, and smoking cessation.;Monitor prescription use compliance.    Expected Outcomes Long Term: Maintenance of blood pressure at goal levels.;Short Term: Continued assessment and intervention until BP is < 140/49m HG in hypertensive participants. < 130/8102mHG in hypertensive participants with diabetes, heart failure or chronic kidney disease.    Lipids Yes    Intervention Provide education and support for participant on nutrition & aerobic/resistive exercise along with prescribed medications to achieve LDL '70mg'$ ,  HDL >'40mg'$ .    Expected Outcomes Short Term: Participant states understanding of desired cholesterol values and is compliant with medications prescribed. Participant is following exercise prescription and nutrition guidelines.;Long Term: Cholesterol controlled with medications as prescribed, with individualized exercise RX and with personalized nutrition plan. Value goals: LDL < '70mg'$ , HDL > 40 mg.             Tobacco Use Initial Evaluation: Social History   Tobacco Use  Smoking Status Former   Packs/day: 1.50   Years: 40.00   Pack years: 60.00   Types: Cigarettes   Quit date: 09/14/2007   Years since quitting: 13.3  Smokeless Tobacco Never  Tobacco Comments   quit 2009    Exercise Goals and Review:  Exercise Goals     RoRobyame 12/31/20 1158             Exercise Goals   Increase Physical Activity Yes       Intervention Provide advice, education, support and counseling about physical activity/exercise needs.;Develop an individualized exercise prescription for aerobic and resistive training based on initial evaluation findings, risk stratification, comorbidities and participant's personal goals.       Expected Outcomes Short Term: Attend rehab on a regular basis to increase amount of physical activity.;Long Term: Add in home exercise to make exercise part of routine and to  increase amount of physical activity.;Long Term: Exercising regularly at least 3-5 days a week.       Increase Strength and Stamina Yes       Intervention Provide advice, education, support and counseling about physical activity/exercise needs.;Develop an individualized exercise prescription for aerobic and resistive training based on initial evaluation findings, risk stratification, comorbidities and participant's personal goals.       Expected Outcomes Short Term: Increase workloads from initial exercise prescription for resistance, speed, and METs.;Short Term: Perform resistance training exercises routinely  during rehab and add in resistance training at home;Long Term: Improve cardiorespiratory fitness, muscular endurance and strength as measured by increased METs and functional capacity (6MWT)       Able to understand and use rate of perceived exertion (RPE) scale Yes       Intervention Provide education and explanation on how to use RPE scale       Expected Outcomes Short Term: Able to use RPE daily in rehab to express subjective intensity level;Long Term:  Able to use RPE to guide intensity level when exercising independently       Able to understand and use Dyspnea scale Yes       Intervention Provide education and explanation on how to use Dyspnea scale       Expected Outcomes Short Term: Able to use Dyspnea scale daily in rehab to express subjective sense of shortness of breath during exertion;Long Term: Able to use Dyspnea scale to guide intensity level when exercising independently       Knowledge and understanding of Target Heart Rate Range (THRR) Yes       Intervention Provide education and explanation of THRR including how the numbers were predicted and where they are located for reference       Expected Outcomes Short Term: Able to state/look up THRR;Short Term: Able to use daily as guideline for intensity in rehab;Long Term: Able to use THRR to govern intensity when exercising independently       Able to check pulse independently Yes       Intervention Provide education and demonstration on how to check pulse in carotid and radial arteries.;Review the importance of being able to check your own pulse for safety during independent exercise       Expected Outcomes Short Term: Able to explain why pulse checking is important during independent exercise;Long Term: Able to check pulse independently and accurately       Understanding of Exercise Prescription Yes       Intervention Provide education, explanation, and written materials on patient's individual exercise prescription       Expected  Outcomes Short Term: Able to explain program exercise prescription;Long Term: Able to explain home exercise prescription to exercise independently                Copy of goals given to participant.

## 2020-12-31 NOTE — Progress Notes (Signed)
Cardiac Individual Treatment Plan  Patient Details  Name: [name] MRN: [id number] Date of Birth: Dec 06, 1950 Referring Provider:   Flowsheet Row Cardiac Rehab from 12/31/2020 in [facility] Cardiac and Pulmonary Rehab  Referring Provider [name] MD       Initial Encounter Date:  Flowsheet Row Cardiac Rehab from 12/31/2020 in [facility] Cardiac and Pulmonary Rehab  Date 12/31/20       Visit Diagnosis: S/P CABG x 5  Patient's Home Medications on Admission:  Current Outpatient Medications:    aspirin EC 325 MG EC tablet, Take 1 tablet (325 mg total) by mouth daily., Disp: 30 tablet, Rfl: 0   atorvastatin (LIPITOR) 80 MG tablet, TAKE 1 TABLET DAILY, Disp: 90 tablet, Rfl: 0   carvedilol (COREG) 6.25 MG tablet, TAKE 1 TABLET TWICE A DAY WITH MEALS, Disp: 180 tablet, Rfl: 2   colchicine 0.6 MG tablet, Take 0.5 tablets (0.3 mg total) by mouth 2 (two) times daily., Disp: 30 tablet, Rfl: 0   diphenhydramine-acetaminophen (TYLENOL PM) 25-500 MG TABS tablet, Take 2 tablets by mouth at bedtime., Disp: , Rfl:    Dulaglutide (TRULICITY) 5.11 MY/1.1ZN SOPN, Inject 0.75 mg into the skin once a week., Disp: 6 mL, Rfl: 1   ezetimibe (ZETIA) 10 MG tablet, Take 1 tablet (10 mg total) by mouth daily., Disp: 90 tablet, Rfl: 3   furosemide (LASIX) 40 MG tablet, Take 1 tablet (40 mg total) by mouth daily., Disp: 90 tablet, Rfl: 0   furosemide (LASIX) 40 MG tablet, , Disp: , Rfl:    isosorbide mononitrate (IMDUR) 30 MG 24 hr tablet, Take 1 tablet (30 mg total) by mouth daily., Disp: 90 tablet, Rfl: 0   JARDIANCE 25 MG TABS tablet, TAKE 1 TABLET DAILY, Disp: 90 tablet, Rfl: 3   metFORMIN (GLUCOPHAGE) 1000 MG tablet, TAKE 1 TABLET TWICE A DAY WITH A MEAL, Disp: 180 tablet, Rfl: 3   Multiple Vitamins-Minerals (MULTIVITAMINS THER. W/MINERALS) TABS, Take 1 tablet by mouth every morning., Disp: , Rfl:    niacin (NIASPAN) 1000 MG CR tablet, TAKE 2 TABLETS DAILY AT BEDTIME, Disp: 180 tablet, Rfl: 2    potassium chloride 20 MEQ/100ML IVPB, , Disp: , Rfl:    potassium chloride SA (KLOR-CON) 20 MEQ tablet, Take 1 tablet (20 mEq total) by mouth 2 (two) times daily., Disp: 180 tablet, Rfl: 0   sitaGLIPtin (JANUVIA) 100 MG tablet, Take 100 mg by mouth daily., Disp: , Rfl:    traMADol (ULTRAM) 50 MG tablet, Take 1 tablet (50 mg total) by mouth every 6 (six) hours as needed for moderate pain., Disp: 28 tablet, Rfl: 0  Past Medical History: Past Medical History:  Diagnosis Date   Angina    Asthma    BCC (basal cell carcinoma of skin) 09/06/2020   left upper back lateral (EDC) ,  left upper arm anterior (EDC), left upper back medial (EDC)    Bursitis of left elbow 2018   Diabetes mellitus without complication ([place])    ED (erectile dysfunction)    HTN (hypertension) 04/12/2011   Hyperlipemia 04/12/2011   Ischemic cardiomyopathy    a. echo 2010: EF 45-50%, mild to mod ant and apical wall HK, trace MR; b. cardiac cath 06/2012: EF 40%, mild MR    Multiple vessel coronary artery disease 04/12/2011   a. remote MI 1996; b. MI 1997 s/p PCI - LAD & diag; c. MI 2009: long stenting P-MLAD & Diag & POBA of jailed diag branch; d. cath 2010: 80% ISR of LAD  w/ L-R collats, med Rx; e. cath 2012: 50-60% tub mLAD, 60-70% dLAD, FFR 0.75 -->s/p PCI dissection => w/ 2 stents (3 total); f. cath 06/2012: no changes from 2012 cath, med Rx, patent stents -> MV CAD 11/20/20 --> CABG X 5   Myocardial infarction (HCC)    x 5   Obesity    OSA (obstructive sleep apnea)    on CPAP   Osteoarthritis    Renal disorder    kidney stone    Tobacco Use: Social History   Tobacco Use  Smoking Status Former   Packs/day: 1.50   Years: 40.00   Pack years: 60.00   Types: Cigarettes   Quit date: 09/14/2007   Years since quitting: 13.3  Smokeless Tobacco Never  Tobacco Comments   quit 2009    Labs: Recent Review Flowsheet Data     Labs for ITP Cardiac and Pulmonary Rehab Latest Ref Rng & Units 11/20/2020 11/20/2020 11/20/2020  11/20/2020 11/20/2020   Cholestrol 0 - 200 mg/dL - - - - -   LDLCALC 0 - 99 mg/dL - - - - -   LDLDIRECT mg/dL - - - - -   HDL >40 mg/dL - - - - -   Trlycerides <150 mg/dL - - - - -   Hemoglobin A1c 4.8 - 5.6 % - - - - -   PHART 7.350 - 7.450 - 7.331(L) 7.335(L) 7.319(L) 7.343(L)   PCO2ART 32.0 - 48.0 mmHg - 42.9 40.0 31.8(L) 40.1   HCO3 20.0 - 28.0 mmol/L - 23.0 21.3 16.4(L) 21.8   TCO2 22 - 32 mmol/L '25 24 23 '$ 17(L) 23   ACIDBASEDEF 0.0 - 2.0 mmol/L - 3.0(H) 4.0(H) 9.0(H) 4.0(H)   O2SAT % - 96.0 99.0 99.0 95.0        Exercise Target Goals: Exercise Program Goal: Individual exercise prescription set using results from initial 6 min walk test and THRR while considering  patient's activity barriers and safety.   Exercise Prescription Goal: Initial exercise prescription builds to 30-45 minutes a day of aerobic activity, 2-3 days per week.  Home exercise guidelines will be given to patient during program as part of exercise prescription that the participant will acknowledge.   Education: Aerobic Exercise: - Group verbal and visual presentation on the components of exercise prescription. Introduces F.I.T.T principle from ACSM for exercise prescriptions.  Reviews F.I.T.T. principles of aerobic exercise including progression. Written material given at graduation.   Education: Resistance Exercise: - Group verbal and visual presentation on the components of exercise prescription. Introduces F.I.T.T principle from ACSM for exercise prescriptions  Reviews F.I.T.T. principles of resistance exercise including progression. Written material given at graduation.    Education: Exercise & Equipment Safety: - Individual verbal instruction and demonstration of equipment use and safety with use of the equipment. Flowsheet Row Cardiac Rehab from 12/31/2020 in [facility] Cardiac and Pulmonary Rehab  Date 12/26/20  Educator [facility]  Instruction Review Code 1- Verbalizes Understanding       Education: Exercise  Physiology & General Exercise Guidelines: - Group verbal and written instruction with models to review the exercise physiology of the cardiovascular system and associated critical values. Provides general exercise guidelines with specific guidelines to those with heart or lung disease.    Education: Flexibility, Balance, Mind/Body Relaxation: - Group verbal and visual presentation with interactive activity on the components of exercise prescription. Introduces F.I.T.T principle from ACSM for exercise prescriptions. Reviews F.I.T.T. principles of flexibility and balance exercise training including progression. Also discusses the mind body  connection.  Reviews various relaxation techniques to help reduce and manage stress (i.e. Deep breathing, progressive muscle relaxation, and visualization). Balance handout provided to take home. Written material given at graduation.   Activity Barriers & Risk Stratification:  Activity Barriers & Cardiac Risk Stratification - 12/31/20 1155       Activity Barriers & Cardiac Risk Stratification   Activity Barriers Deconditioning;Muscular Weakness;Joint Problems;History of Falls;Other (comment);Incisional Pain    Comments occasional left hip pain, vein grafts still painful at times    Cardiac Risk Stratification High             6 Minute Walk:  6 Minute Walk     Row Name 12/31/20 1154         6 Minute Walk   Phase Initial     Distance 1445 feet     Walk Time 6 minutes     # of Rest Breaks 0     MPH 2.74     METS 3.33     RPE 9     Perceived Dyspnea  0     VO2 Peak 11.67     Symptoms Yes (comment)     Comments chronic left hip pain 2/10     Resting HR 83 bpm     Resting BP 126/74     Resting Oxygen Saturation  97 %     Exercise Oxygen Saturation  during 6 min walk 98 %     Max Ex. HR 107 bpm     Max Ex. BP 142/70     2 Minute Post BP 122/66              Oxygen Initial Assessment:   Oxygen Re-Evaluation:   Oxygen Discharge  (Final Oxygen Re-Evaluation):   Initial Exercise Prescription:  Initial Exercise Prescription - 12/31/20 1100       Date of Initial Exercise RX and Referring Provider   Date 12/31/20    Referring Provider [name] MD      Treadmill   MPH 2.7    Grade 1    Minutes 15    METs 3.44      NuStep   Level 3    SPM 80    Minutes 15    METs 3.3      Elliptical   Level 1    Speed 3    Minutes 15    METs 3      Prescription Details   Frequency (times per week) 2    Duration Progress to 30 minutes of continuous aerobic without signs/symptoms of physical distress      Intensity   THRR 40-80% of Max Heartrate 110-137    Ratings of Perceived Exertion 11-13    Perceived Dyspnea 0-4      Progression   Progression Continue to progress workloads to maintain intensity without signs/symptoms of physical distress.      Resistance Training   Training Prescription Yes    Weight 4 lb    Reps 10-15             Perform Capillary Blood Glucose checks as needed.  Exercise Prescription Changes:   Exercise Prescription Changes     Row Name 12/31/20 1100             Response to Exercise   Blood Pressure (Admit) 126/74       Blood Pressure (Exercise) 142/70       Blood Pressure (Exit) 122/66       Heart  Rate (Admit) 83 bpm       Heart Rate (Exercise) 107 bpm       Heart Rate (Exit) 68 bpm       Oxygen Saturation (Admit) 97 %       Oxygen Saturation (Exercise) 98 %       Rating of Perceived Exertion (Exercise) 9       Perceived Dyspnea (Exercise) 0       Symptoms left hip pain 2/10       Comments walk test results                Exercise Comments:   Exercise Goals and Review:   Exercise Goals     Row Name 12/31/20 1158             Exercise Goals   Increase Physical Activity Yes       Intervention Provide advice, education, support and counseling about physical activity/exercise needs.;Develop an individualized exercise prescription for aerobic  and resistive training based on initial evaluation findings, risk stratification, comorbidities and participant's personal goals.       Expected Outcomes Short Term: Attend rehab on a regular basis to increase amount of physical activity.;Long Term: Add in home exercise to make exercise part of routine and to increase amount of physical activity.;Long Term: Exercising regularly at least 3-5 days a week.       Increase Strength and Stamina Yes       Intervention Provide advice, education, support and counseling about physical activity/exercise needs.;Develop an individualized exercise prescription for aerobic and resistive training based on initial evaluation findings, risk stratification, comorbidities and participant's personal goals.       Expected Outcomes Short Term: Increase workloads from initial exercise prescription for resistance, speed, and METs.;Short Term: Perform resistance training exercises routinely during rehab and add in resistance training at home;Long Term: Improve cardiorespiratory fitness, muscular endurance and strength as measured by increased METs and functional capacity (6MWT)       Able to understand and use rate of perceived exertion (RPE) scale Yes       Intervention Provide education and explanation on how to use RPE scale       Expected Outcomes Short Term: Able to use RPE daily in rehab to express subjective intensity level;Long Term:  Able to use RPE to guide intensity level when exercising independently       Able to understand and use Dyspnea scale Yes       Intervention Provide education and explanation on how to use Dyspnea scale       Expected Outcomes Short Term: Able to use Dyspnea scale daily in rehab to express subjective sense of shortness of breath during exertion;Long Term: Able to use Dyspnea scale to guide intensity level when exercising independently       Knowledge and understanding of Target Heart Rate Range (THRR) Yes       Intervention Provide education  and explanation of THRR including how the numbers were predicted and where they are located for reference       Expected Outcomes Short Term: Able to state/look up THRR;Short Term: Able to use daily as guideline for intensity in rehab;Long Term: Able to use THRR to govern intensity when exercising independently       Able to check pulse independently Yes       Intervention Provide education and demonstration on how to check pulse in carotid and radial arteries.;Review the importance of being able to check your own  pulse for safety during independent exercise       Expected Outcomes Short Term: Able to explain why pulse checking is important during independent exercise;Long Term: Able to check pulse independently and accurately       Understanding of Exercise Prescription Yes       Intervention Provide education, explanation, and written materials on patient's individual exercise prescription       Expected Outcomes Short Term: Able to explain program exercise prescription;Long Term: Able to explain home exercise prescription to exercise independently                Exercise Goals Re-Evaluation :   Discharge Exercise Prescription (Final Exercise Prescription Changes):  Exercise Prescription Changes - 12/31/20 1100       Response to Exercise   Blood Pressure (Admit) 126/74    Blood Pressure (Exercise) 142/70    Blood Pressure (Exit) 122/66    Heart Rate (Admit) 83 bpm    Heart Rate (Exercise) 107 bpm    Heart Rate (Exit) 68 bpm    Oxygen Saturation (Admit) 97 %    Oxygen Saturation (Exercise) 98 %    Rating of Perceived Exertion (Exercise) 9    Perceived Dyspnea (Exercise) 0    Symptoms left hip pain 2/10    Comments walk test results             Nutrition:  Target Goals: Understanding of nutrition guidelines, daily intake of sodium '1500mg'$ , cholesterol '200mg'$ , calories 30% from fat and 7% or less from saturated fats, daily to have 5 or more servings of fruits and  vegetables.  Education: All About Nutrition: -Group instruction provided by verbal, written material, interactive activities, discussions, models, and posters to present general guidelines for heart healthy nutrition including fat, fiber, MyPlate, the role of sodium in heart healthy nutrition, utilization of the nutrition label, and utilization of this knowledge for meal planning. Follow up email sent as well. Written material given at graduation. Flowsheet Row Cardiac Rehab from 12/31/2020 in [facility] Cardiac and Pulmonary Rehab  Education need identified 12/31/20       Biometrics:  Pre Biometrics - 12/31/20 1158       Pre Biometrics   Height 5' 8.6" (1.742 m)    Weight 184 lb (83.5 kg)    BMI (Calculated) 27.5    Single Leg Stand 17.5 seconds              Nutrition Therapy Plan and Nutrition Goals:   Nutrition Assessments:  MEDIFICTS Score Key: ?70 Need to make dietary changes  40-70 Heart Healthy Diet ? 40 Therapeutic Level Cholesterol Diet  Flowsheet Row Cardiac Rehab from 12/31/2020 in [facility] Cardiac and Pulmonary Rehab  Picture Your Plate Total Score on Admission 72      Picture Your Plate Scores: [id number] Unhealthy dietary pattern with much room for improvement. 41-50 Dietary pattern unlikely to meet recommendations for good health and room for improvement. 51-60 More healthful dietary pattern, with some room for improvement.  >60 Healthy dietary pattern, although there may be some specific behaviors that could be improved.    Nutrition Goals Re-Evaluation:   Nutrition Goals Discharge (Final Nutrition Goals Re-Evaluation):   Psychosocial: Target Goals: Acknowledge presence or absence of significant depression and/or stress, maximize coping skills, provide positive support system. Participant is able to verbalize types and ability to use techniques and skills needed for reducing stress and depression.   Education: Stress, Anxiety, and Depression - Group verbal and  visual presentation to define topics  covered.  Reviews how body is impacted by stress, anxiety, and depression.  Also discusses healthy ways to reduce stress and to treat/manage anxiety and depression.  Written material given at graduation.   Education: Sleep Hygiene -Provides group verbal and written instruction about how sleep can affect your health.  Define sleep hygiene, discuss sleep cycles and impact of sleep habits. Review good sleep hygiene tips.    Initial Review & Psychosocial Screening:  Initial Psych Review & Screening - 12/26/20 0926       Initial Review   Current issues with History of Depression      Family Dynamics   Good Support System? Yes    Comments His wife and friends are a good support system for him. He had a history of depression but has since then been ok.      Barriers   Psychosocial barriers to participate in program There are no identifiable barriers or psychosocial needs.;The patient should benefit from training in stress management and relaxation.      Screening Interventions   Interventions Encouraged to exercise;Provide feedback about the scores to participant;To provide support and resources with identified psychosocial needs    Expected Outcomes Short Term goal: Utilizing psychosocial counselor, staff and physician to assist with identification of specific Stressors or current issues interfering with healing process. Setting desired goal for each stressor or current issue identified.;Long Term Goal: Stressors or current issues are controlled or eliminated.;Short Term goal: Identification and review with participant of any Quality of Life or Depression concerns found by scoring the questionnaire.;Long Term goal: The participant improves quality of Life and PHQ9 Scores as seen by post scores and/or verbalization of changes             Quality of Life Scores:   Quality of Life - 12/31/20 1159       Quality of Life   Select Quality of Life       Quality of Life Scores   Health/Function Pre 21.47 %    Socioeconomic Pre 26.29 %    Psych/Spiritual Pre 24.07 %    Family Pre 22.4 %    GLOBAL Pre 23.13 %            Scores of 19 and below usually indicate a poorer quality of life in these areas.  A difference of  2-3 points is a clinically meaningful difference.  A difference of 2-3 points in the total score of the Quality of Life Index has been associated with significant improvement in overall quality of life, Malone-image, physical symptoms, and general health in studies assessing change in quality of life.  PHQ-9: Recent Review Flowsheet Data     Depression screen [facility] 2/9 12/31/2020 06/06/2020 05/29/2020 10/18/2019 06/06/2019   Decreased Interest 0 0 0 0 0   Down, Depressed, Hopeless 0 0 0 0 0   PHQ - 2 Score 0 0 0 0 0   Altered sleeping 0 - - - -   Tired, decreased energy 2 - - - -   Change in appetite 1 - - - -   Feeling bad or failure about yourself  0 - - - -   Trouble concentrating 0 - - - -   Moving slowly or fidgety/restless 0 - - - -   Suicidal thoughts 0 - - - -   PHQ-9 Score 3 - - - -   Difficult doing work/chores Not difficult at all - - - -      Interpretation of Total  Score  Total Score Depression Severity:  1-4 = Minimal depression, 5-9 = Mild depression, 10-14 = Moderate depression, 15-19 = Moderately severe depression, 20-27 = Severe depression   Psychosocial Evaluation and Intervention:  Psychosocial Evaluation - 12/26/20 0930       Psychosocial Evaluation & Interventions   Interventions Encouraged to exercise with the program and follow exercise prescription;Stress management education;Relaxation education    Comments His wife and friends are a good support system for him. He had a history of depression but has since then been ok.    Expected Outcomes Short: Exercise regularly to support mental health and notify staff of any changes. Long: maintain mental health and well being through teaching of rehab or  prescribed medications independently.    Continue Psychosocial Services  Follow up required by staff             Psychosocial Re-Evaluation:   Psychosocial Discharge (Final Psychosocial Re-Evaluation):   Vocational Rehabilitation: Provide vocational rehab assistance to qualifying candidates.   Vocational Rehab Evaluation & Intervention:   Education: Education Goals: Education classes will be provided on a variety of topics geared toward better understanding of heart health and risk factor modification. Participant will state understanding/return demonstration of topics presented as noted by education test scores.  Learning Barriers/Preferences:  Learning Barriers/Preferences - 12/26/20 0925       Learning Barriers/Preferences   Learning Barriers None    Learning Preferences None             General Cardiac Education Topics:  AED/CPR: - Group verbal and written instruction with the use of models to demonstrate the basic use of the AED with the basic ABC's of resuscitation.   Anatomy and Cardiac Procedures: - Group verbal and visual presentation and models provide information about basic cardiac anatomy and function. Reviews the testing methods done to diagnose heart disease and the outcomes of the test results. Describes the treatment choices: Medical Management, Angioplasty, or Coronary Bypass Surgery for treating various heart conditions including Myocardial Infarction, Angina, Valve Disease, and Cardiac Arrhythmias.  Written material given at graduation.   Medication Safety: - Group verbal and visual instruction to review commonly prescribed medications for heart and lung disease. Reviews the medication, class of the drug, and side effects. Includes the steps to properly store meds and maintain the prescription regimen.  Written material given at graduation.   Intimacy: - Group verbal instruction through game format to discuss how heart and lung disease can  affect sexual intimacy. Written material given at graduation..   Know Your Numbers and Heart Failure: - Group verbal and visual instruction to discuss disease risk factors for cardiac and pulmonary disease and treatment options.  Reviews associated critical values for Overweight/Obesity, Hypertension, Cholesterol, and Diabetes.  Discusses basics of heart failure: signs/symptoms and treatments.  Introduces Heart Failure Zone chart for action plan for heart failure.  Written material given at graduation. Flowsheet Row Cardiac Rehab from 12/31/2020 in [facility] Cardiac and Pulmonary Rehab  Education need identified 12/31/20       Infection Prevention: - Provides verbal and written material to individual with discussion of infection control including proper hand washing and proper equipment cleaning during exercise session. Flowsheet Row Cardiac Rehab from 12/31/2020 in [facility] Cardiac and Pulmonary Rehab  Date 12/26/20  Educator [facility]  Instruction Review Code 1- Verbalizes Understanding       Falls Prevention: - Provides verbal and written material to individual with discussion of falls prevention and safety. Flowsheet Row Cardiac Rehab from  12/31/2020 in [facility] Cardiac and Pulmonary Rehab  Date 12/26/20  Educator [place]  Instruction Review Code 1- Verbalizes Understanding       Other: -Provides group and verbal instruction on various topics (see comments)   Knowledge Questionnaire Score:  Knowledge Questionnaire Score - 12/31/20 1200       Knowledge Questionnaire Score   Pre Score 24/26 Education Focus: Smoking, Nutrition             Core Components/Risk Factors/Patient Goals at Admission:  Personal Goals and Risk Factors at Admission - 12/31/20 1200       Core Components/Risk Factors/Patient Goals on Admission    Weight Management Yes;Weight Loss    Intervention Weight Management: Develop a combined nutrition and exercise program designed to reach desired caloric intake, while  maintaining appropriate intake of nutrient and fiber, sodium and fats, and appropriate energy expenditure required for the weight goal.;Weight Management: Provide education and appropriate resources to help participant work on and attain dietary goals.;Weight Management/Obesity: Establish reasonable short term and long term weight goals.    Admit Weight 184 lb (83.5 kg)    Goal Weight: Short Term 180 lb (81.6 kg)    Goal Weight: Long Term 174 lb (78.9 kg)    Expected Outcomes Short Term: Continue to assess and modify interventions until short term weight is achieved;Long Term: Adherence to nutrition and physical activity/exercise program aimed toward attainment of established weight goal;Weight Loss: Understanding of general recommendations for a balanced deficit meal plan, which promotes 1-2 lb weight loss per week and includes a negative energy balance of [phone] kcal/d;Understanding recommendations for meals to include 15-35% energy as protein, 25-35% energy from fat, 35-60% energy from carbohydrates, less than '200mg'$  of dietary cholesterol, 20-35 gm of total fiber daily;Understanding of distribution of calorie intake throughout the day with the consumption of 4-5 meals/snacks    Diabetes Yes    Intervention Provide education about signs/symptoms and action to take for hypo/hyperglycemia.;Provide education about proper nutrition, including hydration, and aerobic/resistive exercise prescription along with prescribed medications to achieve blood glucose in normal ranges: Fasting glucose 65-99 mg/dL    Expected Outcomes Long Term: Attainment of HbA1C < 7%.;Short Term: Participant verbalizes understanding of the signs/symptoms and immediate care of hyper/hypoglycemia, proper foot care and importance of medication, aerobic/resistive exercise and nutrition plan for blood glucose control.    Heart Failure Yes    Intervention Provide a combined exercise and nutrition program that is supplemented with education,  support and counseling about heart failure. Directed toward relieving symptoms such as shortness of breath, decreased exercise tolerance, and extremity edema.    Expected Outcomes Improve functional capacity of life;Short term: Attendance in program 2-3 days a week with increased exercise capacity. Reported lower sodium intake. Reported increased fruit and vegetable intake. Reports medication compliance.;Short term: Daily weights obtained and reported for increase. Utilizing diuretic protocols set by physician.;Long term: Adoption of Malone-care skills and reduction of barriers for early signs and symptoms recognition and intervention leading to Malone-care maintenance.    Hypertension Yes    Intervention Provide education on lifestyle modifcations including regular physical activity/exercise, weight management, moderate sodium restriction and increased consumption of fresh fruit, vegetables, and low fat dairy, alcohol moderation, and smoking cessation.;Monitor prescription use compliance.    Expected Outcomes Long Term: Maintenance of blood pressure at goal levels.;Short Term: Continued assessment and intervention until BP is < 140/52m HG in hypertensive participants. < 130/891mHG in hypertensive participants with diabetes, heart failure or chronic kidney disease.  Lipids Yes    Intervention Provide education and support for participant on nutrition & aerobic/resistive exercise along with prescribed medications to achieve LDL '70mg'$ , HDL >'40mg'$ .    Expected Outcomes Short Term: Participant states understanding of desired cholesterol values and is compliant with medications prescribed. Participant is following exercise prescription and nutrition guidelines.;Long Term: Cholesterol controlled with medications as prescribed, with individualized exercise RX and with personalized nutrition plan. Value goals: LDL < '70mg'$ , HDL > 40 mg.             Education:Diabetes - Individual verbal and written instruction  to review signs/symptoms of diabetes, desired ranges of glucose level fasting, after meals and with exercise. Acknowledge that pre and post exercise glucose checks will be done for 3 sessions at entry of program. [place] from 12/31/2020 in [facility] Cardiac and Pulmonary Rehab  Date 12/26/20  Educator [facility]  Instruction Review Code 1- Verbalizes Understanding       Core Components/Risk Factors/Patient Goals Review:    Core Components/Risk Factors/Patient Goals at Discharge (Final Review):    ITP Comments:  ITP Comments     [place] Name 12/26/20 0932 12/31/20 1149         ITP Comments Virtual Visit completed. Patient informed on EP and RD appointment and 6 Minute walk test. Patient also informed of patient health questionnaires on My Chart. Patient Verbalizes understanding. Visit diagnosis can be found in [facility] 11/15/2020. Completed 6MWT and gym orientation. Initial ITP created and sent for review to Dr. Emily Filbert, Medical Director.               Comments: Initial ITO

## 2021-01-01 ENCOUNTER — Other Ambulatory Visit: Payer: Self-pay

## 2021-01-01 DIAGNOSIS — Z48812 Encounter for surgical aftercare following surgery on the circulatory system: Secondary | ICD-10-CM | POA: Diagnosis not present

## 2021-01-01 DIAGNOSIS — Z7984 Long term (current) use of oral hypoglycemic drugs: Secondary | ICD-10-CM | POA: Diagnosis not present

## 2021-01-01 DIAGNOSIS — Z951 Presence of aortocoronary bypass graft: Secondary | ICD-10-CM

## 2021-01-01 LAB — GLUCOSE, CAPILLARY
Glucose-Capillary: 272 mg/dL — ABNORMAL HIGH (ref 70–99)
Glucose-Capillary: 170 mg/dL — ABNORMAL HIGH (ref 70–99)

## 2021-01-01 NOTE — Progress Notes (Signed)
Daily Session Note  Patient Details  Name: Josias Tomerlin MRN: [id number] Date of Birth: 1951/03/10 Referring Provider:   Flowsheet Row Cardiac Rehab from 12/31/2020 in [facility] Cardiac and Pulmonary Rehab  Referring Provider [name] MD       Encounter Date: 01/01/2021  Check In:  Session Check In - 01/01/21 0921       Check-In   Supervising physician immediately available to respond to emergencies See telemetry face sheet for immediately available ER MD    Location [facility]-Cardiac & Pulmonary Rehab    Staff Present Birdie Sons, MPA, RN;Amanda Oletta Darter, BA, ACSM CEP, Exercise Physiologist;Jessica Luan Pulling, MA, RCEP, CCRP, CCET    Virtual Visit No    Medication changes reported     No    Fall or balance concerns reported    No    Warm-up and Cool-down Performed on first and last piece of equipment    Resistance Training Performed Yes    VAD Patient? No    PAD/SET Patient? No      Pain Assessment   Currently in Pain? No/denies                Social History   Tobacco Use  Smoking Status Former   Packs/day: 1.50   Years: 40.00   Pack years: 60.00   Types: Cigarettes   Quit date: 09/14/2007   Years since quitting: 13.3  Smokeless Tobacco Never  Tobacco Comments   quit 2009    Goals Met:  Independence with exercise equipment Exercise tolerated well No report of cardiac concerns or symptoms Strength training completed today  Goals Unmet:  Not Applicable  Comments: First full day of exercise!  Patient was oriented to gym and equipment including functions, settings, policies, and procedures.  Patient's individual exercise prescription and treatment plan were reviewed.  All starting workloads were established based on the results of the 6 minute walk test done at initial orientation visit.  The plan for exercise progression was also introduced and progression will be customized based on patient's performance and goals.    Dr. Emily Filbert is Medical  Director for [place].  Dr. Ottie Glazier is Medical Director for [facility] Pulmonary Rehabilitation.

## 2021-01-03 ENCOUNTER — Other Ambulatory Visit: Payer: Self-pay

## 2021-01-03 DIAGNOSIS — Z951 Presence of aortocoronary bypass graft: Secondary | ICD-10-CM | POA: Diagnosis not present

## 2021-01-03 DIAGNOSIS — Z48812 Encounter for surgical aftercare following surgery on the circulatory system: Secondary | ICD-10-CM | POA: Diagnosis not present

## 2021-01-03 DIAGNOSIS — Z7984 Long term (current) use of oral hypoglycemic drugs: Secondary | ICD-10-CM | POA: Diagnosis not present

## 2021-01-03 LAB — GLUCOSE, CAPILLARY
Glucose-Capillary: 286 mg/dL — ABNORMAL HIGH (ref 70–99)
Glucose-Capillary: 222 mg/dL — ABNORMAL HIGH (ref 70–99)

## 2021-01-03 NOTE — Progress Notes (Signed)
Daily Session Note  Patient Details  Name: [name] MRN: [id number] Date of Birth: 02-07-1951 Referring Provider:   Flowsheet Row Cardiac Rehab from 12/31/2020 in [facility] Cardiac and Pulmonary Rehab  Referring Provider [name] MD       Encounter Date: 01/03/2021  Check In:  Session Check In - 01/03/21 0913       Check-In   Supervising physician immediately available to respond to emergencies See telemetry face sheet for immediately available ER MD    Location [facility]-Cardiac & Pulmonary Rehab    Staff Present [name], MPA, [name], BA, ACSM CEP, Exercise Physiologist;[name] [name], BS, ACSM CEP, Exercise Physiologist    Virtual Visit No    Medication changes reported     No    Fall or balance concerns reported    No    Warm-up and Cool-down Performed on first and last piece of equipment    Resistance Training Performed Yes    VAD Patient? No    PAD/SET Patient? No      Pain Assessment   Currently in Pain? No/denies                Social History   Tobacco Use  Smoking Status Former   Packs/day: 1.50   Years: 40.00   Pack years: 60.00   Types: Cigarettes   Quit date: 09/14/2007   Years since quitting: 13.3  Smokeless Tobacco Never  Tobacco Comments   quit 2009    Goals Met:  Independence with exercise equipment Exercise tolerated well No report of cardiac concerns or symptoms Strength training completed today  Goals Unmet:  Not Applicable  Comments: Pt able to follow exercise prescription today without complaint.  Will continue to monitor for progression.    Dr. Emily Malone is Medical Director for [name].  Dr. Ottie Malone is Medical Director for [facility] Pulmonary Rehabilitation.

## 2021-01-07 ENCOUNTER — Telehealth: Payer: Self-pay | Admitting: Family Medicine

## 2021-01-07 ENCOUNTER — Other Ambulatory Visit: Payer: Self-pay

## 2021-01-07 DIAGNOSIS — M25552 Pain in left hip: Secondary | ICD-10-CM

## 2021-01-07 DIAGNOSIS — Z951 Presence of aortocoronary bypass graft: Secondary | ICD-10-CM

## 2021-01-07 NOTE — Telephone Encounter (Signed)
Patient states he is having left hip pain that is interfering with his everyday life.   Patient requesting referral to a [place] ortho provider . Please advise

## 2021-01-07 NOTE — Progress Notes (Signed)
Completed initial RD consultation ?

## 2021-01-07 NOTE — Telephone Encounter (Signed)
Patient states he is having left hip pain that is interfering with his everyday life.    Patient requesting referral to a [place] ortho provider . Please advise    Malee Grays,cma

## 2021-01-08 ENCOUNTER — Other Ambulatory Visit: Payer: Self-pay

## 2021-01-08 DIAGNOSIS — Z951 Presence of aortocoronary bypass graft: Secondary | ICD-10-CM | POA: Diagnosis not present

## 2021-01-08 DIAGNOSIS — Z48812 Encounter for surgical aftercare following surgery on the circulatory system: Secondary | ICD-10-CM | POA: Diagnosis not present

## 2021-01-08 DIAGNOSIS — Z7984 Long term (current) use of oral hypoglycemic drugs: Secondary | ICD-10-CM | POA: Diagnosis not present

## 2021-01-08 LAB — GLUCOSE, CAPILLARY
Glucose-Capillary: 241 mg/dL — ABNORMAL HIGH (ref 70–99)
Glucose-Capillary: 192 mg/dL — ABNORMAL HIGH (ref 70–99)

## 2021-01-08 NOTE — Telephone Encounter (Signed)
I can place a referral to orthopedics though he will have to go to [facility] to see a [place] provider.  Is he okay with that?

## 2021-01-08 NOTE — Progress Notes (Signed)
Daily Session Note  Patient Details  Name: [name] MRN: [id number] Date of Birth: July 12, 1950 Referring Provider:   Flowsheet Row Cardiac Rehab from 12/31/2020 in [facility] Cardiac and Pulmonary Rehab  Referring Provider [name] MD       Encounter Date: 01/08/2021  Check In:  Session Check In - 01/08/21 0920       Check-In   Supervising physician immediately available to respond to emergencies See telemetry face sheet for immediately available ER MD    Location [facility]-Cardiac & Pulmonary Rehab    Staff Present Birdie Sons, MPA, RN;Amanda Oletta Darter, BA, ACSM CEP, Exercise Physiologist;Jessica Luan Pulling, MA, RCEP, CCRP, CCET    Virtual Visit No    Medication changes reported     No    Fall or balance concerns reported    No    Warm-up and Cool-down Performed on first and last piece of equipment    Resistance Training Performed Yes    VAD Patient? No    PAD/SET Patient? No      Pain Assessment   Currently in Pain? No/denies                Social History   Tobacco Use  Smoking Status Former   Packs/day: 1.50   Years: 40.00   Pack years: 60.00   Types: Cigarettes   Quit date: 09/14/2007   Years since quitting: 13.3  Smokeless Tobacco Never  Tobacco Comments   quit 2009    Goals Met:  Independence with exercise equipment Exercise tolerated well No report of concerns or symptoms today Strength training completed today  Goals Unmet:  Not Applicable  Comments: Pt able to follow exercise prescription today without complaint.  Will continue to monitor for progression.    Dr. Emily Filbert is Medical Director for [place].  Dr. Ottie Glazier is Medical Director for [facility] Pulmonary Rehabilitation.

## 2021-01-09 ENCOUNTER — Inpatient Hospital Stay: Payer: Medicare Other | Admitting: Family Medicine

## 2021-01-09 NOTE — Telephone Encounter (Signed)
Referral placed.

## 2021-01-09 NOTE — Telephone Encounter (Signed)
I called and spoke with the patent and he stated he is scheduled for ortho with a [place] provider and the date is 01/24/2021.  Ion Gonnella,cma

## 2021-01-10 ENCOUNTER — Other Ambulatory Visit: Payer: Self-pay

## 2021-01-10 ENCOUNTER — Encounter: Payer: Medicare Other | Attending: Cardiovascular Disease | Admitting: *Deleted

## 2021-01-10 DIAGNOSIS — Z951 Presence of aortocoronary bypass graft: Secondary | ICD-10-CM

## 2021-01-10 NOTE — Progress Notes (Signed)
Daily Session Note  Patient Details  Name: [name] MRN: [id number] Date of Birth: 22-Oct-1950 Referring Provider:   Flowsheet Row Cardiac Rehab from 12/31/2020 in [facility] Cardiac and Pulmonary Rehab  Referring Provider Isaac Rogue MD       Encounter Date: 01/10/2021  Check In:  Session Check In - 01/10/21 0912       Check-In   Supervising physician immediately available to respond to emergencies See telemetry face sheet for immediately available ER MD    Location [facility]-Cardiac & Pulmonary Rehab    Staff Present Birdie Sons, MPA, RN;Melissa [place], RDN, LDN;Quaid Yeakle [place], MA, RCEP, CCRP, Rosalio Macadamia, BS, ACSM CEP, Exercise Physiologist;Joseph [place], [place]    Virtual Visit No    Medication changes reported     No    Fall or balance concerns reported    No    Warm-up and Cool-down Performed on first and last piece of equipment    Resistance Training Performed Yes    VAD Patient? No    PAD/SET Patient? No      Pain Assessment   Currently in Pain? No/denies                Social History   Tobacco Use  Smoking Status Former   Packs/day: 1.50   Years: 40.00   Pack years: 60.00   Types: Cigarettes   Quit date: 09/14/2007   Years since quitting: 13.3  Smokeless Tobacco Never  Tobacco Comments   quit 2009    Goals Met:  Independence with exercise equipment Exercise tolerated well No report of concerns or symptoms today Strength training completed today  Goals Unmet:  Not Applicable  Comments: Pt able to follow exercise prescription today without complaint.  Will continue to monitor for progression. Reviewed home exercise with pt today.  Pt plans to walk and go to [employer] for exercise.  He is also planning to do some weights on his own at home too. Reviewed THR, pulse, RPE, sign and symptoms, pulse oximetery and when to call 911 or MD.  Also discussed weather considerations and indoor options.  Pt voiced understanding.     Dr.  Emily Filbert is Medical Director for [place].  Dr. Ottie Glazier is Medical Director for [facility] Pulmonary Rehabilitation.

## 2021-01-15 ENCOUNTER — Other Ambulatory Visit: Payer: Self-pay

## 2021-01-15 DIAGNOSIS — Z951 Presence of aortocoronary bypass graft: Secondary | ICD-10-CM

## 2021-01-15 NOTE — Progress Notes (Signed)
Daily Session Note  Patient Details  Name: [name] MRN: [id number] Date of Birth: October 22, 1950 Referring Provider:   Flowsheet Row Cardiac Rehab from 12/31/2020 in [facility] Cardiac and Pulmonary Rehab  Referring Provider [name] MD       Encounter Date: 01/15/2021  Check In:  Session Check In - 01/15/21 0916       Check-In   Supervising physician immediately available to respond to emergencies See telemetry face sheet for immediately available ER MD    Location [facility]-Cardiac & Pulmonary Rehab    Staff Present Birdie Sons, MPA, Elveria Rising, BA, ACSM CEP, Exercise Physiologist;Laureen Owens Shark, BS, RRT, CPFT    Virtual Visit No    Medication changes reported     No    Fall or balance concerns reported    No    Warm-up and Cool-down Performed on first and last piece of equipment    Resistance Training Performed Yes    VAD Patient? No    PAD/SET Patient? No      Pain Assessment   Currently in Pain? No/denies                Social History   Tobacco Use  Smoking Status Former   Packs/day: 1.50   Years: 40.00   Pack years: 60.00   Types: Cigarettes   Quit date: 09/14/2007   Years since quitting: 13.3  Smokeless Tobacco Never  Tobacco Comments   quit 2009    Goals Met:  Independence with exercise equipment Exercise tolerated well No report of concerns or symptoms today Strength training completed today  Goals Unmet:  Not Applicable  Comments: Pt able to follow exercise prescription today without complaint.  Will continue to monitor for progression.    Dr. Emily Filbert is Medical Director for [place].  Dr. Ottie Glazier is Medical Director for [facility] Pulmonary Rehabilitation.

## 2021-01-16 ENCOUNTER — Encounter: Payer: Self-pay | Admitting: *Deleted

## 2021-01-16 ENCOUNTER — Ambulatory Visit: Payer: Medicare Other | Admitting: Orthopaedic Surgery

## 2021-01-16 ENCOUNTER — Other Ambulatory Visit: Payer: Self-pay

## 2021-01-16 DIAGNOSIS — Z951 Presence of aortocoronary bypass graft: Secondary | ICD-10-CM

## 2021-01-16 NOTE — Telephone Encounter (Signed)
This came from his surgeon following his heart surgery.  He needs to contact them to see if he needs to continue on this.

## 2021-01-16 NOTE — Progress Notes (Signed)
Cardiac Individual Treatment Plan  Patient Details  Name: [name] MRN: [id number] Date of Birth: Dec 06, 1950 Referring Provider:   Flowsheet Row Cardiac Rehab from 12/31/2020 in [facility] Cardiac and Pulmonary Rehab  Referring Provider [name] MD       Initial Encounter Date:  Flowsheet Row Cardiac Rehab from 12/31/2020 in [facility] Cardiac and Pulmonary Rehab  Date 12/31/20       Visit Diagnosis: S/P CABG x 5  Patient's Home Medications on Admission:  Current Outpatient Medications:    aspirin EC 325 MG EC tablet, Take 1 tablet (325 mg total) by mouth daily., Disp: 30 tablet, Rfl: 0   atorvastatin (LIPITOR) 80 MG tablet, TAKE 1 TABLET DAILY, Disp: 90 tablet, Rfl: 0   carvedilol (COREG) 6.25 MG tablet, TAKE 1 TABLET TWICE A DAY WITH MEALS, Disp: 180 tablet, Rfl: 2   colchicine 0.6 MG tablet, Take 0.5 tablets (0.3 mg total) by mouth 2 (two) times daily., Disp: 30 tablet, Rfl: 0   diphenhydramine-acetaminophen (TYLENOL PM) 25-500 MG TABS tablet, Take 2 tablets by mouth at bedtime., Disp: , Rfl:    Dulaglutide (TRULICITY) 5.11 MY/1.1ZN SOPN, Inject 0.75 mg into the skin once a week., Disp: 6 mL, Rfl: 1   ezetimibe (ZETIA) 10 MG tablet, Take 1 tablet (10 mg total) by mouth daily., Disp: 90 tablet, Rfl: 3   furosemide (LASIX) 40 MG tablet, Take 1 tablet (40 mg total) by mouth daily., Disp: 90 tablet, Rfl: 0   furosemide (LASIX) 40 MG tablet, , Disp: , Rfl:    isosorbide mononitrate (IMDUR) 30 MG 24 hr tablet, Take 1 tablet (30 mg total) by mouth daily., Disp: 90 tablet, Rfl: 0   JARDIANCE 25 MG TABS tablet, TAKE 1 TABLET DAILY, Disp: 90 tablet, Rfl: 3   metFORMIN (GLUCOPHAGE) 1000 MG tablet, TAKE 1 TABLET TWICE A DAY WITH A MEAL, Disp: 180 tablet, Rfl: 3   Multiple Vitamins-Minerals (MULTIVITAMINS THER. W/MINERALS) TABS, Take 1 tablet by mouth every morning., Disp: , Rfl:    niacin (NIASPAN) 1000 MG CR tablet, TAKE 2 TABLETS DAILY AT BEDTIME, Disp: 180 tablet, Rfl: 2    potassium chloride 20 MEQ/100ML IVPB, , Disp: , Rfl:    potassium chloride SA (KLOR-CON) 20 MEQ tablet, Take 1 tablet (20 mEq total) by mouth 2 (two) times daily., Disp: 180 tablet, Rfl: 0   sitaGLIPtin (JANUVIA) 100 MG tablet, Take 100 mg by mouth daily., Disp: , Rfl:    traMADol (ULTRAM) 50 MG tablet, Take 1 tablet (50 mg total) by mouth every 6 (six) hours as needed for moderate pain., Disp: 28 tablet, Rfl: 0  Past Medical History: Past Medical History:  Diagnosis Date   Angina    Asthma    BCC (basal cell carcinoma of skin) 09/06/2020   left upper back lateral (EDC) ,  left upper arm anterior (EDC), left upper back medial (EDC)    Bursitis of left elbow 2018   Diabetes mellitus without complication ([place])    ED (erectile dysfunction)    HTN (hypertension) 04/12/2011   Hyperlipemia 04/12/2011   Ischemic cardiomyopathy    a. echo 2010: EF 45-50%, mild to mod ant and apical wall HK, trace MR; b. cardiac cath 06/2012: EF 40%, mild MR    Multiple vessel coronary artery disease 04/12/2011   a. remote MI 1996; b. MI 1997 s/p PCI - LAD & diag; c. MI 2009: long stenting P-MLAD & Diag & POBA of jailed diag branch; d. cath 2010: 80% ISR of LAD  w/ L-R collats, med Rx; e. cath 2012: 50-60% tub mLAD, 60-70% dLAD, FFR 0.75 -->s/p PCI dissection => w/ 2 stents (3 total); f. cath 06/2012: no changes from 2012 cath, med Rx, patent stents -> MV CAD 11/20/20 --> CABG X 5   Myocardial infarction (HCC)    x 5   Obesity    OSA (obstructive sleep apnea)    on CPAP   Osteoarthritis    Renal disorder    kidney stone    Tobacco Use: Social History   Tobacco Use  Smoking Status Former   Packs/day: 1.50   Years: 40.00   Pack years: 60.00   Types: Cigarettes   Quit date: 09/14/2007   Years since quitting: 13.3  Smokeless Tobacco Never  Tobacco Comments   quit 2009    Labs: Recent Review Flowsheet Data     Labs for ITP Cardiac and Pulmonary Rehab Latest Ref Rng & Units 11/20/2020 11/20/2020 11/20/2020  11/20/2020 11/20/2020   Cholestrol 0 - 200 mg/dL - - - - -   LDLCALC 0 - 99 mg/dL - - - - -   LDLDIRECT mg/dL - - - - -   HDL >40 mg/dL - - - - -   Trlycerides <150 mg/dL - - - - -   Hemoglobin A1c 4.8 - 5.6 % - - - - -   PHART 7.350 - 7.450 - 7.331(L) 7.335(L) 7.319(L) 7.343(L)   PCO2ART 32.0 - 48.0 mmHg - 42.9 40.0 31.8(L) 40.1   HCO3 20.0 - 28.0 mmol/L - 23.0 21.3 16.4(L) 21.8   TCO2 22 - 32 mmol/L _0 17(L) 23   ACIDBASEDEF 0.0 - 2.0 mmol/L - 3.0(H) 4.0(H) 9.0(H) 4.0(H)   O2SAT % - 96.0 99.0 99.0 95.0        Exercise Target Goals: Exercise Program Goal: Individual exercise prescription set using results from initial 6 min walk test and THRR while considering  patient's activity barriers and safety.   Exercise Prescription Goal: Initial exercise prescription builds to 30-45 minutes a day of aerobic activity, 2-3 days per week.  Home exercise guidelines will be given to patient during program as part of exercise prescription that the participant will acknowledge.   Education: Aerobic Exercise: - Group verbal and visual presentation on the components of exercise prescription. Introduces F.I.T.T principle from ACSM for exercise prescriptions.  Reviews F.I.T.T. principles of aerobic exercise including progression. Written material given at graduation. Flowsheet Row Cardiac Rehab from 01/10/2021 in [facility] Cardiac and Pulmonary Rehab  Date 01/03/21  Educator [facility]  Instruction Review Code 1- Verbalizes Understanding       Education: Resistance Exercise: - Group verbal and visual presentation on the components of exercise prescription. Introduces F.I.T.T principle from ACSM for exercise prescriptions  Reviews F.I.T.T. principles of resistance exercise including progression. Written material given at graduation. Flowsheet Row Cardiac Rehab from 01/10/2021 in [facility] Cardiac and Pulmonary Rehab  Date 01/10/21  Educator [employer]  Instruction Review Code 1- Verbalizes Understanding         Education: Exercise & Equipment Safety: - Individual verbal instruction and demonstration of equipment use and safety with use of the equipment. Flowsheet Row Cardiac Rehab from 01/10/2021 in [facility] Cardiac and Pulmonary Rehab  Date 12/26/20  Educator [facility]  Instruction Review Code 1- Verbalizes Understanding       Education: Exercise Physiology & General Exercise Guidelines: - Group verbal and written instruction with models to review the exercise physiology of the cardiovascular system and associated critical values. Provides general exercise  guidelines with specific guidelines to those with heart or lung disease.    Education: Flexibility, Balance, Mind/Body Relaxation: - Group verbal and visual presentation with interactive activity on the components of exercise prescription. Introduces F.I.T.T principle from ACSM for exercise prescriptions. Reviews F.I.T.T. principles of flexibility and balance exercise training including progression. Also discusses the mind body connection.  Reviews various relaxation techniques to help reduce and manage stress (i.e. Deep breathing, progressive muscle relaxation, and visualization). Balance handout provided to take home. Written material given at graduation.   Activity Barriers & Risk Stratification:  Activity Barriers & Cardiac Risk Stratification - 12/31/20 1155       Activity Barriers & Cardiac Risk Stratification   Activity Barriers Deconditioning;Muscular Weakness;Joint Problems;History of Falls;Other (comment);Incisional Pain    Comments occasional left hip pain, vein grafts still painful at times    Cardiac Risk Stratification High             6 Minute Walk:  6 Minute Walk     Row Name 12/31/20 1154         6 Minute Walk   Phase Initial     Distance 1445 feet     Walk Time 6 minutes     # of Rest Breaks 0     MPH 2.74     METS 3.33     RPE 9     Perceived Dyspnea  0     VO2 Peak 11.67     Symptoms Yes (comment)     Comments  chronic left hip pain 2/10     Resting HR 83 bpm     Resting BP 126/74     Resting Oxygen Saturation  97 %     Exercise Oxygen Saturation  during 6 min walk 98 %     Max Ex. HR 107 bpm     Max Ex. BP 142/70     2 Minute Post BP 122/66              Oxygen Initial Assessment:   Oxygen Re-Evaluation:   Oxygen Discharge (Final Oxygen Re-Evaluation):   Initial Exercise Prescription:  Initial Exercise Prescription - 12/31/20 1100       Date of Initial Exercise RX and Referring Provider   Date 12/31/20    Referring Provider [name] MD      Treadmill   MPH 2.7    Grade 1    Minutes 15    METs 3.44      NuStep   Level 3    SPM 80    Minutes 15    METs 3.3      Elliptical   Level 1    Speed 3    Minutes 15    METs 3      Prescription Details   Frequency (times per week) 2    Duration Progress to 30 minutes of continuous aerobic without signs/symptoms of physical distress      Intensity   THRR 40-80% of Max Heartrate 110-137    Ratings of Perceived Exertion 11-13    Perceived Dyspnea 0-4      Progression   Progression Continue to progress workloads to maintain intensity without signs/symptoms of physical distress.      Resistance Training   Training Prescription Yes    Weight 4 lb    Reps 10-15             Perform Capillary Blood Glucose checks as needed.  Exercise Prescription Changes:   Exercise  Prescription Changes     Row Name 12/31/20 1100 01/07/21 1200 01/10/21 0900         Response to Exercise   Blood Pressure (Admit) 126/74 122/70 --     Blood Pressure (Exercise) 142/70 132/72 --     Blood Pressure (Exit) 122/66 126/64 --     Heart Rate (Admit) 83 bpm 94 bpm --     Heart Rate (Exercise) 107 bpm 122 bpm --     Heart Rate (Exit) 68 bpm 104 bpm --     Oxygen Saturation (Admit) 97 % -- --     Oxygen Saturation (Exercise) 98 % -- --     Rating of Perceived Exertion (Exercise) 9 12 --     Perceived Dyspnea (Exercise) 0 -- --      Symptoms left hip pain 2/10 none --     Comments walk test results second full day of exercise --     Duration -- Progress to 30 minutes of  aerobic without signs/symptoms of physical distress --     Intensity -- THRR unchanged --           Progression   Progression -- Continue to progress workloads to maintain intensity without signs/symptoms of physical distress. --     Average METs -- 2.97 --           Resistance Training   Training Prescription -- Yes --     Weight -- 4 lb --     Reps -- 10-15 --           Interval Training   Interval Training -- No --           Treadmill   MPH -- 3 --     Grade -- 1.5 --     Minutes -- 15 --     METs -- 3.92 --           NuStep   Level -- 3 --     Minutes -- 15 --     METs -- 2.9 --           Elliptical   Level -- 1 --     Minutes -- 15 --     METs -- 2.1 --           Home Exercise Plan   Plans to continue exercise at -- -- [employer] (comment)  [employer]     Frequency -- -- Add 3 additional days to program exercise sessions.     Initial Home Exercises Provided -- -- 01/10/21              Exercise Comments:   Exercise Comments     Row Name 01/01/21 [phone]           Exercise Comments First full day of exercise!  Patient was oriented to gym and equipment including functions, settings, policies, and procedures.  Patient's individual exercise prescription and treatment plan were reviewed.  All starting workloads were established based on the results of the 6 minute walk test done at initial orientation visit.  The plan for exercise progression was also introduced and progression will be customized based on patient's performance and goals.                Exercise Goals and Review:   Exercise Goals     Row Name 12/31/20 1158             Exercise Goals   Increase Physical Activity Yes  Intervention Provide advice, education, support and counseling about physical activity/exercise  needs.;Develop an individualized exercise prescription for aerobic and resistive training based on initial evaluation findings, risk stratification, comorbidities and participant's personal goals.       Expected Outcomes Short Term: Attend rehab on a regular basis to increase amount of physical activity.;Long Term: Add in home exercise to make exercise part of routine and to increase amount of physical activity.;Long Term: Exercising regularly at least 3-5 days a week.       Increase Strength and Stamina Yes       Intervention Provide advice, education, support and counseling about physical activity/exercise needs.;Develop an individualized exercise prescription for aerobic and resistive training based on initial evaluation findings, risk stratification, comorbidities and participant's personal goals.       Expected Outcomes Short Term: Increase workloads from initial exercise prescription for resistance, speed, and METs.;Short Term: Perform resistance training exercises routinely during rehab and add in resistance training at home;Long Term: Improve cardiorespiratory fitness, muscular endurance and strength as measured by increased METs and functional capacity (6MWT)       Able to understand and use rate of perceived exertion (RPE) scale Yes       Intervention Provide education and explanation on how to use RPE scale       Expected Outcomes Short Term: Able to use RPE daily in rehab to express subjective intensity level;Long Term:  Able to use RPE to guide intensity level when exercising independently       Able to understand and use Dyspnea scale Yes       Intervention Provide education and explanation on how to use Dyspnea scale       Expected Outcomes Short Term: Able to use Dyspnea scale daily in rehab to express subjective sense of shortness of breath during exertion;Long Term: Able to use Dyspnea scale to guide intensity level when exercising independently       Knowledge and understanding of  Target Heart Rate Range (THRR) Yes       Intervention Provide education and explanation of THRR including how the numbers were predicted and where they are located for reference       Expected Outcomes Short Term: Able to state/look up THRR;Short Term: Able to use daily as guideline for intensity in rehab;Long Term: Able to use THRR to govern intensity when exercising independently       Able to check pulse independently Yes       Intervention Provide education and demonstration on how to check pulse in carotid and radial arteries.;Review the importance of being able to check your own pulse for safety during independent exercise       Expected Outcomes Short Term: Able to explain why pulse checking is important during independent exercise;Long Term: Able to check pulse independently and accurately       Understanding of Exercise Prescription Yes       Intervention Provide education, explanation, and written materials on patient's individual exercise prescription       Expected Outcomes Short Term: Able to explain program exercise prescription;Long Term: Able to explain home exercise prescription to exercise independently                Exercise Goals Re-Evaluation :  Exercise Goals Re-Evaluation     Row Name 01/01/21 0100 01/07/21 1208 01/10/21 0956         Exercise Goal Re-Evaluation   Exercise Goals Review Increase Physical Activity;Able to understand and use rate of perceived exertion (  RPE) scale;Knowledge and understanding of Target Heart Rate Range (THRR);Understanding of Exercise Prescription;Able to understand and use Dyspnea scale;Able to check pulse independently;Increase Strength and Stamina Increase Physical Activity;Increase Strength and Stamina Increase Physical Activity;Increase Strength and Stamina;Understanding of Exercise Prescription     Comments Reviewed RPE and dyspnea scales, THR and program prescription with pt today.  Pt voiced understanding and was given a copy of  goals to take home. Nesta is doing well the first couple sessions that he has been here. He has already increased his treadmill speed and incline to 3/ 1.5%. We will continue to increase his workloads as he progresses throughout the program. Will continue to monitor. Reviewed home exercise with pt today.  Pt plans to walk and go to [employer] for exercise.  He is also planning to do some weights on his own at home too. Reviewed THR, pulse, RPE, sign and symptoms, pulse oximetery and when to call 911 or MD.  Also discussed weather considerations and indoor options.  Pt voiced understanding.     Expected Outcomes Short: Use RPE daily to regulate intensity. Long: Follow program prescription in THR. Short: Increase workloads as tolerated Long: Continue to increase overall MET level Short: Return to [employer] on off days Long: Continue to improve stamina              Discharge Exercise Prescription (Final Exercise Prescription Changes):  Exercise Prescription Changes - 01/10/21 0900       Home Exercise Plan   Plans to continue exercise at [employer] (comment)   [employer]   Frequency Add 3 additional days to program exercise sessions.    Initial Home Exercises Provided 01/10/21             Nutrition:  Target Goals: Understanding of nutrition guidelines, daily intake of sodium <1545m, cholesterol <2052m calories 30% from fat and 7% or less from saturated fats, daily to have 5 or more servings of fruits and vegetables.  Education: All About Nutrition: -Group instruction provided by verbal, written material, interactive activities, discussions, models, and posters to present general guidelines for heart healthy nutrition including fat, fiber, MyPlate, the role of sodium in heart healthy nutrition, utilization of the nutrition label, and utilization of this knowledge for meal planning. Follow up email sent as well. Written material given at graduation. Flowsheet Row [facility]  Education need identified 12/31/20       Biometrics:  Pre Biometrics - 12/31/20 1158       Pre Biometrics   Height 5' 8.6" (1.742 m)    Weight 184 lb (83.5 kg)    BMI (Calculated) 27.5    Single Leg Stand 17.5 seconds              Nutrition Therapy Plan and Nutrition Goals:  Nutrition Therapy & Goals - 01/07/21 0800       Nutrition Therapy   Diet Heart healthy, low Na, diabetes friendly    Drug/Food Interactions Statins/Certain Fruits    Protein (specify units) 65g    Fiber 30 grams    Whole Grain Foods 3 servings    Saturated Fats 12 max. grams    Fruits and Vegetables 8 servings/day    Sodium 1.5 grams      Personal Nutrition Goals   Nutrition Goal ST: Continue with ST goal made to reduce added sugar, practice CHO counting, pair CHO foods with protien and fat - ex: fruit with nuts LT:  manage carbohydrates (choose complex CHO most often, 2-3 CHO for meals, 1-2 CHO for snacks, pair CHO with protein and/or fat)    Comments He has seen a nutritionist previously and he has been slowly incorporating her tips. PYP score 72.  T2DM, no insulin. He feels he eats too mnay carbs, but he is monitoring sugar - he is not eating so many sweets. He would then like to start working on his carbs. Varied diet. B: crsipy oat squares with strawberries and blueberries and banana and 2% milk L: can have leftovers - his wife likes to eat out more than him: 1-2x/week goes to [employer] to eat (does not want to change this). D: cooks: tonight flounder and hushpuppy mix. He prefers fresh or frozen vegetables with his meals. S: red pears and other fruit during the day Drinks: water and sometimes cheerwine Reviewed heart healthy eating and diabetes friendly eating.      Intervention Plan   Intervention Prescribe, educate and counsel regarding individualized specific dietary modifications aiming towards targeted core components such as  weight, hypertension, lipid management, diabetes, heart failure and other comorbidities.;Nutrition handout(s) given to patient.    Expected Outcomes Short Term Goal: Understand basic principles of dietary content, such as calories, fat, sodium, cholesterol and nutrients.;Short Term Goal: A plan has been developed with personal nutrition goals set during dietitian appointment.;Long Term Goal: Adherence to prescribed nutrition plan.             Nutrition Assessments:  MEDIFICTS Score Key: ?70 Need to make dietary changes  40-70 Heart Healthy Diet ? 40 Therapeutic Level Cholesterol Diet  Flowsheet Row Cardiac Rehab from 12/31/2020 in [facility] Cardiac and Pulmonary Rehab  Picture Your Plate Total Score on Admission 72      Picture Your Plate Scores: <40 Unhealthy dietary pattern with much room for improvement. 41-50 Dietary pattern unlikely to meet recommendations for good health and room for improvement. 51-60 More healthful dietary pattern, with some room for improvement.  >60 Healthy dietary pattern, although there may be some specific behaviors that could be improved.    Nutrition Goals Re-Evaluation:   Nutrition Goals Discharge (Final Nutrition Goals Re-Evaluation):   Psychosocial: Target Goals: Acknowledge presence or absence of significant depression and/or stress, maximize coping skills, provide positive support system. Participant is able to verbalize types and ability to use techniques and skills needed for reducing stress and depression.   Education: Stress, Anxiety, and Depression - Group verbal and visual presentation to define topics covered.  Reviews how body is impacted by stress, anxiety, and depression.  Also discusses healthy ways to reduce stress and to treat/manage anxiety and depression.  Written material given at graduation.   Education: Sleep Hygiene -Provides group verbal and written instruction about how sleep can affect your health.  Define sleep hygiene,  discuss sleep cycles and impact of sleep habits. Review good sleep hygiene tips.    Initial Review & Psychosocial Screening:  Initial Psych Review & Screening - 12/26/20 0926       Initial Review   Current issues with History of Depression      Family Dynamics   Good Support System? Yes    Comments His wife and friends are a good support system for him. He had a history of depression but has since then been ok.      Barriers   Psychosocial barriers to participate in program There are no identifiable barriers or psychosocial needs.;The patient should benefit from training in stress management and relaxation.  Screening Interventions   Interventions Encouraged to exercise;Provide feedback about the scores to participant;To provide support and resources with identified psychosocial needs    Expected Outcomes Short Term goal: Utilizing psychosocial counselor, staff and physician to assist with identification of specific Stressors or current issues interfering with healing process. Setting desired goal for each stressor or current issue identified.;Long Term Goal: Stressors or current issues are controlled or eliminated.;Short Term goal: Identification and review with participant of any Quality of Life or Depression concerns found by scoring the questionnaire.;Long Term goal: The participant improves quality of Life and PHQ9 Scores as seen by post scores and/or verbalization of changes             Quality of Life Scores:   Quality of Life - 12/31/20 1159       Quality of Life   Select Quality of Life      Quality of Life Scores   Health/Function Pre 21.47 %    Socioeconomic Pre 26.29 %    Psych/Spiritual Pre 24.07 %    Family Pre 22.4 %    GLOBAL Pre 23.13 %            Scores of 19 and below usually indicate a poorer quality of life in these areas.  A difference of  2-3 points is a clinically meaningful difference.  A difference of 2-3 points in the total score of the  Quality of Life Index has been associated with significant improvement in overall quality of life, Malone-image, physical symptoms, and general health in studies assessing change in quality of life.  PHQ-9: Recent Review Flowsheet Data     Depression screen [facility] 2/9 12/31/2020 06/06/2020 05/29/2020 10/18/2019 06/06/2019   Decreased Interest 0 0 0 0 0   Down, Depressed, Hopeless 0 0 0 0 0   PHQ - 2 Score 0 0 0 0 0   Altered sleeping 0 - - - -   Tired, decreased energy 2 - - - -   Change in appetite 1 - - - -   Feeling bad or failure about yourself  0 - - - -   Trouble concentrating 0 - - - -   Moving slowly or fidgety/restless 0 - - - -   Suicidal thoughts 0 - - - -   PHQ-9 Score 3 - - - -   Difficult doing work/chores Not difficult at all - - - -      Interpretation of Total Score  Total Score Depression Severity:  1-4 = Minimal depression, 5-9 = Mild depression, 10-14 = Moderate depression, 15-19 = Moderately severe depression, 20-27 = Severe depression   Psychosocial Evaluation and Intervention:  Psychosocial Evaluation - 12/26/20 0930       Psychosocial Evaluation & Interventions   Interventions Encouraged to exercise with the program and follow exercise prescription;Stress management education;Relaxation education    Comments His wife and friends are a good support system for him. He had a history of depression but has since then been ok.    Expected Outcomes Short: Exercise regularly to support mental health and notify staff of any changes. Long: maintain mental health and well being through teaching of rehab or prescribed medications independently.    Continue Psychosocial Services  Follow up required by staff             Psychosocial Re-Evaluation:   Psychosocial Discharge (Final Psychosocial Re-Evaluation):   Vocational Rehabilitation: Provide vocational rehab assistance to qualifying candidates.   Vocational Rehab Evaluation & Intervention:  Education: Education  Goals: Education classes will be provided on a variety of topics geared toward better understanding of heart health and risk factor modification. Participant will state understanding/return demonstration of topics presented as noted by education test scores.  Learning Barriers/Preferences:  Learning Barriers/Preferences - 12/26/20 0925       Learning Barriers/Preferences   Learning Barriers None    Learning Preferences None             General Cardiac Education Topics:  AED/CPR: - Group verbal and written instruction with the use of models to demonstrate the basic use of the AED with the basic ABC's of resuscitation.   Anatomy and Cardiac Procedures: - Group verbal and visual presentation and models provide information about basic cardiac anatomy and function. Reviews the testing methods done to diagnose heart disease and the outcomes of the test results. Describes the treatment choices: Medical Management, Angioplasty, or Coronary Bypass Surgery for treating various heart conditions including Myocardial Infarction, Angina, Valve Disease, and Cardiac Arrhythmias.  Written material given at graduation. Flowsheet Row Cardiac Rehab from 01/10/2021 in [facility] Cardiac and Pulmonary Rehab  Date 01/10/21  Educator [facility]  Instruction Review Code 1- Verbalizes Understanding       Medication Safety: - Group verbal and visual instruction to review commonly prescribed medications for heart and lung disease. Reviews the medication, class of the drug, and side effects. Includes the steps to properly store meds and maintain the prescription regimen.  Written material given at graduation.   Intimacy: - Group verbal instruction through game format to discuss how heart and lung disease can affect sexual intimacy. Written material given at graduation.. Flowsheet Row Cardiac Rehab from 01/10/2021 in [facility] Cardiac and Pulmonary Rehab  Date 01/03/21  Educator [facility]  Instruction Review Code 1- Verbalizes  Understanding       Know Your Numbers and Heart Failure: - Group verbal and visual instruction to discuss disease risk factors for cardiac and pulmonary disease and treatment options.  Reviews associated critical values for Overweight/Obesity, Hypertension, Cholesterol, and Diabetes.  Discusses basics of heart failure: signs/symptoms and treatments.  Introduces Heart Failure Zone chart for action plan for heart failure.  Written material given at graduation. Flowsheet Row Cardiac Rehab from 01/10/2021 in [facility] Cardiac and Pulmonary Rehab  Education need identified 12/31/20       Infection Prevention: - Provides verbal and written material to individual with discussion of infection control including proper hand washing and proper equipment cleaning during exercise session. Flowsheet Row Cardiac Rehab from 01/10/2021 in [facility] Cardiac and Pulmonary Rehab  Date 12/26/20  Educator [facility]  Instruction Review Code 1- Verbalizes Understanding       Falls Prevention: - Provides verbal and written material to individual with discussion of falls prevention and safety. Flowsheet Row Cardiac Rehab from 01/10/2021 in [facility] Cardiac and Pulmonary Rehab  Date 12/26/20  Educator Cherylann Banas  Instruction Review Code 1- Verbalizes Understanding       Other: -Provides group and verbal instruction on various topics (see comments)   Knowledge Questionnaire Score:  Knowledge Questionnaire Score - 12/31/20 1200       Knowledge Questionnaire Score   Pre Score 24/26 Education Focus: Smoking, Nutrition             Core Components/Risk Factors/Patient Goals at Admission:  Personal Goals and Risk Factors at Admission - 12/31/20 1200       Core Components/Risk Factors/Patient Goals on Admission    Weight Management Yes;Weight Loss    Intervention Weight Management:  Develop a combined nutrition and exercise program designed to reach desired caloric intake, while maintaining appropriate intake of nutrient and  fiber, sodium and fats, and appropriate energy expenditure required for the weight goal.;Weight Management: Provide education and appropriate resources to help participant work on and attain dietary goals.;Weight Management/Obesity: Establish reasonable short term and long term weight goals.    Admit Weight 184 lb (83.5 kg)    Goal Weight: Short Term 180 lb (81.6 kg)    Goal Weight: Long Term 174 lb (78.9 kg)    Expected Outcomes Short Term: Continue to assess and modify interventions until short term weight is achieved;Long Term: Adherence to nutrition and physical activity/exercise program aimed toward attainment of established weight goal;Weight Loss: Understanding of general recommendations for a balanced deficit meal plan, which promotes 1-2 lb weight loss per week and includes a negative energy balance of [phone] kcal/d;Understanding recommendations for meals to include 15-35% energy as protein, 25-35% energy from fat, 35-60% energy from carbohydrates, less than 211m of dietary cholesterol, 20-35 gm of total fiber daily;Understanding of distribution of calorie intake throughout the day with the consumption of 4-5 meals/snacks    Diabetes Yes    Intervention Provide education about signs/symptoms and action to take for hypo/hyperglycemia.;Provide education about proper nutrition, including hydration, and aerobic/resistive exercise prescription along with prescribed medications to achieve blood glucose in normal ranges: Fasting glucose 65-99 mg/dL    Expected Outcomes Long Term: Attainment of HbA1C < 7%.;Short Term: Participant verbalizes understanding of the signs/symptoms and immediate care of hyper/hypoglycemia, proper foot care and importance of medication, aerobic/resistive exercise and nutrition plan for blood glucose control.    Heart Failure Yes    Intervention Provide a combined exercise and nutrition program that is supplemented with education, support and counseling about heart failure.  Directed toward relieving symptoms such as shortness of breath, decreased exercise tolerance, and extremity edema.    Expected Outcomes Improve functional capacity of life;Short term: Attendance in program 2-3 days a week with increased exercise capacity. Reported lower sodium intake. Reported increased fruit and vegetable intake. Reports medication compliance.;Short term: Daily weights obtained and reported for increase. Utilizing diuretic protocols set by physician.;Long term: Adoption of Malone-care skills and reduction of barriers for early signs and symptoms recognition and intervention leading to Malone-care maintenance.    Hypertension Yes    Intervention Provide education on lifestyle modifcations including regular physical activity/exercise, weight management, moderate sodium restriction and increased consumption of fresh fruit, vegetables, and low fat dairy, alcohol moderation, and smoking cessation.;Monitor prescription use compliance.    Expected Outcomes Long Term: Maintenance of blood pressure at goal levels.;Short Term: Continued assessment and intervention until BP is < 140/995mHG in hypertensive participants. < 130/8017mG in hypertensive participants with diabetes, heart failure or chronic kidney disease.    Lipids Yes    Intervention Provide education and support for participant on nutrition & aerobic/resistive exercise along with prescribed medications to achieve LDL <44m29mDL >40mg71m Expected Outcomes Short Term: Participant states understanding of desired cholesterol values and is compliant with medications prescribed. Participant is following exercise prescription and nutrition guidelines.;Long Term: Cholesterol controlled with medications as prescribed, with individualized exercise RX and with personalized nutrition plan. Value goals: LDL < 44mg,51m > 40 mg.             Education:Diabetes - Individual verbal and written instruction to review signs/symptoms of diabetes,  desired ranges of glucose level fasting, after meals and with exercise. Acknowledge that pre and  post exercise glucose checks will be done for 3 sessions at entry of program. Flowsheet Row Cardiac Rehab from 01/10/2021 in [facility] Cardiac and Pulmonary Rehab  Date 12/26/20  Educator [facility]  Instruction Review Code 1- Verbalizes Understanding       Core Components/Risk Factors/Patient Goals Review:    Core Components/Risk Factors/Patient Goals at Discharge (Final Review):    ITP Comments:  ITP Comments     Tea Name 12/26/20 0932 12/31/20 1149 01/01/21 0922 01/07/21 0835 01/16/21 0809   ITP Comments Virtual Visit completed. Patient informed on EP and RD appointment and 6 Minute walk test. Patient also informed of patient health questionnaires on My Chart. Patient Verbalizes understanding. Visit diagnosis can be found in [facility] 11/15/2020. Completed 6MWT and gym orientation. Initial ITP created and sent for review to Dr. Emily Filbert, Medical Director. First full day of exercise!  Patient was oriented to gym and equipment including functions, settings, policies, and procedures.  Patient's individual exercise prescription and treatment plan were reviewed.  All starting workloads were established based on the results of the 6 minute walk test done at initial orientation visit.  The plan for exercise progression was also introduced and progression will be customized based on patient's performance and goals. Completed initial RD consultation 30 Day review completed. Medical Director ITP review done, changes made as directed, and signed approval by Medical Director.            Comments:

## 2021-01-17 ENCOUNTER — Encounter: Payer: Medicare Other | Admitting: *Deleted

## 2021-01-17 ENCOUNTER — Other Ambulatory Visit: Payer: Self-pay

## 2021-01-17 DIAGNOSIS — Z951 Presence of aortocoronary bypass graft: Secondary | ICD-10-CM | POA: Diagnosis not present

## 2021-01-17 NOTE — Progress Notes (Signed)
Daily Session Note  Patient Details  Name: [name] MRN: [id number] Date of Birth: 02/19/1951 Referring Provider:   Flowsheet Row Cardiac Rehab from 12/31/2020 in [facility] Cardiac and Pulmonary Rehab  Referring Provider Gollan, Timothy MD       Encounter Date: 01/17/2021  Check In:  Session Check In - 01/17/21 0934       Check-In   Supervising physician immediately available to respond to emergencies See telemetry face sheet for immediately available ER MD    Location [facility]-Cardiac & Pulmonary Rehab    Staff Present Susanne Bice, RN, BSN, CCRP;Laureen Brown, BS, RRT, CPFT;Kelly Hayes, BS, ACSM CEP, Exercise Physiologist;Amanda Sommer, BA, ACSM CEP, Exercise Physiologist    Virtual Visit No    Medication changes reported     No    Fall or balance concerns reported    No    Warm-up and Cool-down Performed on first and last piece of equipment    Resistance Training Performed Yes    VAD Patient? No    PAD/SET Patient? No      Pain Assessment   Currently in Pain? No/denies                Social History   Tobacco Use  Smoking Status Former   Packs/day: 1.50   Years: 40.00   Pack years: 60.00   Types: Cigarettes   Quit date: 09/14/2007   Years since quitting: 13.3  Smokeless Tobacco Never  Tobacco Comments   quit 2009    Goals Met:  Independence with exercise equipment Exercise tolerated well No report of concerns or symptoms today  Goals Unmet:  Not Applicable  Comments: Pt able to follow exercise prescription today without complaint.  Will continue to monitor for progression.    Dr. Mark Miller is Medical Director for [facility].  Dr. Fuad Aleskerov is Medical Director for [facility]monary [facility]. 

## 2021-01-22 ENCOUNTER — Other Ambulatory Visit: Payer: Self-pay

## 2021-01-22 DIAGNOSIS — Z951 Presence of aortocoronary bypass graft: Secondary | ICD-10-CM

## 2021-01-22 NOTE — Progress Notes (Signed)
Daily Session Note  Patient Details  Name: [name] MRN: [id number] Date of Birth: 1950/07/31 Referring Provider:   Flowsheet Row Cardiac Rehab from 12/31/2020 in [facility] Cardiac and Pulmonary Rehab  Referring Provider [name] MD       Encounter Date: 01/22/2021  Check In:  Session Check In - 01/22/21 0924       Check-In   Supervising physician immediately available to respond to emergencies See telemetry face sheet for immediately available ER MD    Location [facility]-Cardiac & Pulmonary Rehab    Staff Present Birdie Sons, MPA, RN;Jessica Luan Pulling, MA, RCEP, CCRP, CCET;Amanda Sommer, BA, ACSM CEP, Exercise Physiologist    Virtual Visit No    Medication changes reported     No    Fall or balance concerns reported    No    Warm-up and Cool-down Performed on first and last piece of equipment    Resistance Training Performed Yes    VAD Patient? No    PAD/SET Patient? No      Pain Assessment   Currently in Pain? No/denies                Social History   Tobacco Use  Smoking Status Former   Packs/day: 1.50   Years: 40.00   Pack years: 60.00   Types: Cigarettes   Quit date: 09/14/2007   Years since quitting: 13.3  Smokeless Tobacco Never  Tobacco Comments   quit 2009    Goals Met:  Independence with exercise equipment Exercise tolerated well No report of concerns or symptoms today Strength training completed today  Goals Unmet:  Not Applicable  Comments: Pt able to follow exercise prescription today without complaint.  Will continue to monitor for progression.    Dr. Emily Filbert is Medical Director for [facility].  Dr. Ottie Glazier is Medical Director for [facility] Pulmonary Rehabilitation.

## 2021-01-24 ENCOUNTER — Ambulatory Visit (INDEPENDENT_AMBULATORY_CARE_PROVIDER_SITE_OTHER): Payer: Medicare Other

## 2021-01-24 ENCOUNTER — Other Ambulatory Visit: Payer: Self-pay

## 2021-01-24 ENCOUNTER — Ambulatory Visit (INDEPENDENT_AMBULATORY_CARE_PROVIDER_SITE_OTHER): Payer: Medicare Other | Admitting: Orthopaedic Surgery

## 2021-01-24 DIAGNOSIS — M25552 Pain in left hip: Secondary | ICD-10-CM

## 2021-01-24 DIAGNOSIS — I200 Unstable angina: Secondary | ICD-10-CM

## 2021-01-24 DIAGNOSIS — Z951 Presence of aortocoronary bypass graft: Secondary | ICD-10-CM | POA: Diagnosis not present

## 2021-01-24 DIAGNOSIS — M7062 Trochanteric bursitis, left hip: Secondary | ICD-10-CM

## 2021-01-24 MED ORDER — LIDOCAINE HCL 1 % IJ SOLN
3.0000 mL | INTRAMUSCULAR | Status: AC | PRN
  Administered 2021-01-24: 3 mL

## 2021-01-24 MED ORDER — METHYLPREDNISOLONE ACETATE 40 MG/ML IJ SUSP
40.0000 mg | INTRAMUSCULAR | Status: AC | PRN
  Administered 2021-01-24: 40 mg via INTRA_ARTICULAR

## 2021-01-24 NOTE — Progress Notes (Signed)
Daily Session Note  Patient Details  Name: Glyn Gerads MRN: [id number] Date of Birth: 1950/09/22 Referring Provider:   Flowsheet Row Cardiac Rehab from 12/31/2020 in [facility] Cardiac and Pulmonary Rehab  Referring Provider [name] MD       Encounter Date: 01/24/2021  Check In:  Session Check In - 01/24/21 0914       Check-In   Supervising physician immediately available to respond to emergencies See telemetry face sheet for immediately available ER MD    Location [facility]-Cardiac & Pulmonary Rehab    Staff Present Birdie Sons, MPA, RN;Maris Bena [place], MA, RCEP, CCRP, CCET;Amanda Sommer, BA, ACSM CEP, Exercise Physiologist;Kelly Amedeo Plenty, BS, ACSM CEP, Exercise Physiologist;Joseph [place], [place]    Virtual Visit No    Medication changes reported     No    Fall or balance concerns reported    No    Warm-up and Cool-down Performed on first and last piece of equipment    Resistance Training Performed Yes    VAD Patient? No    PAD/SET Patient? No      Pain Assessment   Currently in Pain? No/denies                Social History   Tobacco Use  Smoking Status Former   Packs/day: 1.50   Years: 40.00   Pack years: 60.00   Types: Cigarettes   Quit date: 09/14/2007   Years since quitting: 13.3  Smokeless Tobacco Never  Tobacco Comments   quit 2009    Goals Met:  Proper associated with RPD/PD & O2 Sat Independence with exercise equipment Using PLB without cueing & demonstrates good technique Exercise tolerated well No report of concerns or symptoms today Strength training completed today  Goals Unmet:  Not Applicable  Comments: Pt able to follow exercise prescription today without complaint.  Will continue to monitor for progression.    Dr. Emily Filbert is Medical Director for [facility].  Dr. Ottie Glazier is Medical Director for [facility] Pulmonary Rehabilitation.

## 2021-01-24 NOTE — Progress Notes (Signed)
Office Visit Note   Patient: [name]           Date of Birth: [birth year]           MRN: [id number] Visit Date: 01/24/2021              Requested by: [name], MD [address] [place],  [place] [id number] PCP: [name], MD   Assessment & Plan: Visit Diagnoses:  1. Pain in left hip   2. Trochanteric bursitis, left hip     Plan: His clinical exam with signs and symptoms are consistent with left hip tendinitis and bursitis.  I recommended a steroid injection over the area of maximal tenderness and he agreed to this and tolerated it well.  Also demonstrated stretching exercises for him and he was able to understand these stretching exercises.  All questions and concerns were answered addressed.  Follow-up can be as needed.  We can always repeat an injection in this area in 8 to 12 weeks if needed.  Follow-Up Instructions: Return if symptoms worsen or fail to improve.   Orders:  Orders Placed This Encounter  Procedures   Large Joint Inj   XR HIP UNILAT W OR W/O PELVIS 2-3 VIEWS LEFT   No orders of the defined types were placed in this encounter.     Procedures: Large Joint Inj: L greater trochanter on 01/24/2021 4:16 PM Indications: pain and diagnostic evaluation Details: 22 G 1.5 in needle, lateral approach  Arthrogram: No  Medications: 3 mL lidocaine 1 %; 40 mg methylPREDNISolone acetate 40 MG/ML Outcome: tolerated well, no immediate complications Procedure, treatment alternatives, risks and benefits explained, specific risks discussed. Consent was given by the patient. Immediately prior to procedure a time out was called to verify the correct patient, procedure, equipment, support staff and site/side marked as required. Patient was prepped and draped in the usual sterile fashion.      Clinical Data: No additional findings.   Subjective: Chief Complaint  Patient presents with   Left Hip - Pain  Patient is a very pleasant  70 year old gentleman I am seeing for the first time.  He points to the lateral aspect of his left hip as a source of his pain and that is why he is coming to see me.  He is actually 8 weeks into coronary artery bypass graft surgery.  He is in cardiac rehab for this.  He denies any groin pain.  He is just taking some Tylenol for pain.  He denies any back pain.  He is on blood thinning medication so he can take regular anti-inflammatories.  He is anxious to be able to get back to riding his motorcycle. HPI  Review of Systems Today there is no chest pain, shortness of breath, fever, chills, nausea, vomiting  Objective: Vital Signs: There were no vitals taken for this visit.  Physical Exam He is alert and oriented x3 and in no acute distress Ortho Exam He gets on the exam table easily.  Both hips have fluid and full range of motion with no blocks to rotation and no stiffness.  He is very flexible.  He has pain over the proximal trochanteric area to palpation on the left side. Specialty Comments:  No specialty comments available.  Imaging: XR HIP UNILAT W OR W/O PELVIS 2-3 VIEWS LEFT  Result Date: 01/24/2021 An AP pelvis and lateral left hip shows normal hips bilaterally with no acute findings.  The joint space is  well-maintained.    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   Coronary artery disease 11/20/2020   Rash 01/25/2020   Colon cancer screening 10/18/2019   Bone spicules of jaw 12/27/2018   Ingrowing nail 12/27/2018   Insomnia 02/18/2018   Ingrown toenail 01/26/2018   Adrenal adenoma 03/03/2017   Right shoulder pain 03/03/2017   History of colon polyps 12/01/2016   Olecranon bursitis of left elbow 12/01/2016   OSA (obstructive sleep apnea) 07/30/2016   Low back pain 05/15/2016   Actinic keratoses 02/07/2016   Right hip pain 02/07/2016   Ischemic cardiomyopathy    Type 2 diabetes mellitus,  uncontrolled ([place]) 11/30/2014   Sleep disturbance 08/08/2013   CAD (coronary artery disease) 04/12/2011   HTN (hypertension) 04/12/2011   Hyperlipemia 04/12/2011   Past Medical History:  Diagnosis Date   Angina    Asthma    BCC (basal cell carcinoma of skin) 09/06/2020   left upper back lateral (EDC) ,  left upper arm anterior (EDC), left upper back medial (EDC)    Bursitis of left elbow 2018   Diabetes mellitus without complication ([place])    ED (erectile dysfunction)    HTN (hypertension) 04/12/2011   Hyperlipemia 04/12/2011   Ischemic cardiomyopathy    a. echo 2010: EF 45-50%, mild to mod ant and apical wall HK, trace MR; b. cardiac cath 06/2012: EF 40%, mild MR    Multiple vessel coronary artery disease 04/12/2011   a. remote MI 1996; b. MI 1997 s/p PCI - LAD & diag; c. MI 2009: long stenting P-MLAD & Diag & POBA of jailed diag branch; d. cath 2010: 80% ISR of LAD w/ L-R collats, med Rx; e. cath 2012: 50-60% tub mLAD, 60-70% dLAD, FFR 0.75 -->s/p PCI dissection => w/ 2 stents (3 total); f. cath 06/2012: no changes from 2012 cath, med Rx, patent stents -> MV CAD 11/20/20 --> CABG X 5   Myocardial infarction (HCC)    x 5   Obesity    OSA (obstructive sleep apnea)    on CPAP   Osteoarthritis    Renal disorder    kidney stone    Family History  Problem Relation Age of Onset   COPD Mother    Other Father        muscle tumor   Stroke Father    Prostate cancer Father        mets   Heart disease Father    Alcoholism Father    Stroke Sister    Heart block Sister        Heart bypass   Bladder Cancer Neg Hx    Kidney cancer Neg Hx     Past Surgical History:  Procedure Laterality Date   CORONARY ANGIOPLASTY     x 5   CORONARY ARTERY BYPASS GRAFT N/A 11/20/2020   Procedure: CORONARY ARTERY BYPASS GRAFTING (CABG) x  FIVE (LIMA-dLAD, SVG-PDA, SVG-D1, SeqSVG-OM1-2) ON PUMP  USING LEFT INTERNAL MAMMARY ARTERTY AND LEFT ENDOSCOPIC GREATER SAPHENOUS VEIN CONDUITS, RIGHT LEG OPENED NOT  HARVESTED;  Surgeon: Wonda Olds, MD;  Location: [facility] OR;  Service: Open Heart Surgery;  Laterality: N/A;   CORONARY STENT PLACEMENT     x 7   INTRAVASCULAR PRESSURE WIRE/FFR STUDY N/A 11/15/2020   Procedure: INTRAVASCULAR PRESSURE WIRE/FFR STUDY;  Surgeon: Nelva Bush, MD;  Location: [facility] CV LAB;  Service: Cardiovascular;  Laterality: N/A;   LEFT  HEART CATH AND CORONARY ANGIOGRAPHY N/A 11/15/2020   Procedure: LEFT HEART CATH AND CORONARY ANGIOGRAPHY;  Surgeon: Nelva Bush, MD;  Location: [facility] CV LAB;  Service: Cardiovascular;  Laterality: N/A;   LEFT HEART CATHETERIZATION WITH CORONARY ANGIOGRAM N/A 04/14/2011   Procedure: LEFT HEART CATHETERIZATION WITH CORONARY ANGIOGRAM;  Surgeon: Pixie Casino, MD;  Location: [facility] CATH LAB;  Service: Cardiovascular;  Laterality: N/A;   LEFT HEART CATHETERIZATION WITH CORONARY ANGIOGRAM N/A 06/25/2012   Procedure: LEFT HEART CATHETERIZATION WITH CORONARY ANGIOGRAM;  Surgeon: Peter M Martinique, MD;  Location: [facility] CATH LAB;  Service: Cardiovascular;  Laterality: N/A;   TEE WITHOUT CARDIOVERSION N/A 11/20/2020   Procedure: TRANSESOPHAGEAL ECHOCARDIOGRAM (TEE);  Surgeon: Wonda Olds, MD;  Location: [facility];  Service: Open Heart Surgery;  Laterality: N/A;   TONSILLECTOMY AND ADENOIDECTOMY  1955   Social History   Occupational History   Occupation: [employer]-retired   Occupation: Press photographer  Tobacco Use   Smoking status: Former    Packs/day: 1.50    Years: 40.00    Pack years: 60.00    Types: Cigarettes    Quit date: 09/14/2007    Years since quitting: 13.3   Smokeless tobacco: Never   Tobacco comments:    quit 2009  Vaping Use   Vaping Use: Never used  Substance and Sexual Activity   Alcohol use: No    Alcohol/week: 0.0 standard drinks    Comment: rarely 1 beer in last 5 years   Drug use: No    Types: Marijuana    Comment: pt stopped fall 2012   Sexual activity: Never

## 2021-01-29 ENCOUNTER — Other Ambulatory Visit: Payer: Self-pay

## 2021-01-29 DIAGNOSIS — Z951 Presence of aortocoronary bypass graft: Secondary | ICD-10-CM | POA: Diagnosis not present

## 2021-01-29 NOTE — Progress Notes (Signed)
Daily Session Note  Patient Details  Name: [name] MRN: [id number] Date of Birth: 1951/02/14 Referring Provider:   Flowsheet Row Cardiac Rehab from 12/31/2020 in [facility] Cardiac [facility] Rehab  Referring Provider [name] MD       Encounter Date: 01/29/2021  Check In:  Session Check In - 01/29/21 0917       Check-In   Supervising physician immediately available to respond to emergencies See telemetry face sheet for immediately available ER MD    Location [facility]-Cardiac & Pulmonary Rehab    Staff Present Birdie Sons, MPA, RN;Jessica Luan Pulling, MA, RCEP, CCRP, CCET;Amanda Sommer, BA, ACSM CEP, Exercise Physiologist    Virtual Visit No    Medication changes reported     No    Fall or balance concerns reported    No    Warm-up and Cool-down Performed on first and last piece of equipment    Resistance Training Performed Yes    VAD Patient? No    PAD/SET Patient? No      Pain Assessment   Currently in Pain? No/denies                Social History   Tobacco Use  Smoking Status Former   Packs/day: 1.50   Years: 40.00   Pack years: 60.00   Types: Cigarettes   Quit date: 09/14/2007   Years since quitting: 13.3  Smokeless Tobacco Never  Tobacco Comments   quit 2009    Goals Met:  Independence with exercise equipment Exercise tolerated well No report of concerns or symptoms today Strength training completed today  Goals Unmet:  Not Applicable  Comments: Pt able to follow exercise prescription today without complaint.  Will continue to monitor for progression.    Dr. Emily Filbert is Medical Director for [place].  Dr. Ottie Glazier is Medical Director for [facility] Pulmonary Rehabilitation.

## 2021-01-30 ENCOUNTER — Telehealth: Payer: Self-pay | Admitting: Family Medicine

## 2021-01-30 NOTE — Telephone Encounter (Signed)
Patient has a 28m follow up with Dr Caryl Bis in November. He would like to know if labs will be done that day so he can come fasting.

## 2021-01-30 NOTE — Telephone Encounter (Signed)
I called and LVM informing the patient that he would need to be fasting because he would have labs done at his November appointment.  Nneoma Harral,cma

## 2021-01-31 ENCOUNTER — Other Ambulatory Visit: Payer: Self-pay

## 2021-01-31 DIAGNOSIS — Z951 Presence of aortocoronary bypass graft: Secondary | ICD-10-CM | POA: Diagnosis not present

## 2021-01-31 NOTE — Progress Notes (Signed)
Daily Session Note  Patient Details  Name: [name] MRN: [id number] Date of Birth: 08/11/1950 Referring Provider:   Flowsheet Row Cardiac Rehab from 12/31/2020 in [facility] Cardiac and Pulmonary Rehab  Referring Provider Gollan, Timothy MD       Encounter Date: 01/31/2021  Check In:  Session Check In - 01/31/21 0915       Check-In   Supervising physician immediately available to respond to emergencies See telemetry face sheet for immediately available ER MD    Location [facility]-Cardiac & Pulmonary Rehab    Staff Present Kelly Bollinger, MPA, RN;Amanda Sommer, BA, ACSM CEP, Exercise Physiologist;Kelly Hayes, BS, ACSM CEP, Exercise Physiologist    Virtual Visit No    Medication changes reported     No    Fall or balance concerns reported    No    Warm-up and Cool-down Performed on first and last piece of equipment    Resistance Training Performed Yes    VAD Patient? No    PAD/SET Patient? No      Pain Assessment   Currently in Pain? No/denies                Social History   Tobacco Use  Smoking Status Former   Packs/day: 1.50   Years: 40.00   Pack years: 60.00   Types: Cigarettes   Quit date: 09/14/2007   Years since quitting: 13.3  Smokeless Tobacco Never  Tobacco Comments   quit 2009    Goals Met:  Independence with exercise equipment Exercise tolerated well No report of concerns or symptoms today Strength training completed today  Goals Unmet:  Not Applicable  Comments: Pt able to follow exercise prescription today without complaint.  Will continue to monitor for progression.    Dr. Mark Miller is Medical Director for [facility].  Dr. Fuad Aleskerov is Medical Director for [facility]. 

## 2021-02-05 ENCOUNTER — Other Ambulatory Visit: Payer: Self-pay

## 2021-02-05 DIAGNOSIS — Z951 Presence of aortocoronary bypass graft: Secondary | ICD-10-CM

## 2021-02-05 NOTE — Progress Notes (Signed)
Daily Session Note  Patient Details  Name: [name] MRN: [id number] Date of Birth: [birth year] Referring Provider:   Flowsheet Row Cardiac Rehab from 12/31/2020 in [facility] Cardiac and Pulmonary Rehab  Referring Provider [name] MD       Encounter Date: 02/05/2021  Check In:  Session Check In - 02/05/21 0920       Check-In   Supervising physician immediately available to respond to emergencies See telemetry face sheet for immediately available ER MD    Location [facility]    Staff Present Birdie Sons, MPA, RN;Jessica Luan Pulling, MA, RCEP, CCRP, CCET;Amanda Sommer, BA, ACSM CEP, Exercise Physiologist    Virtual Visit No    Medication changes reported     No    Fall or balance concerns reported    No    Warm-up and Cool-down Performed on first and last piece of equipment    Resistance Training Performed Yes    VAD Patient? No    PAD/SET Patient? No      Pain Assessment   Currently in Pain? No/denies                Social History   Tobacco Use  Smoking Status Former   Packs/day: 1.50   Years: 40.00   Pack years: 60.00   Types: Cigarettes   Quit date: 09/14/2007   Years since quitting: 13.4  Smokeless Tobacco Never  Tobacco Comments   quit 2009    Goals Met:  Independence with exercise equipment Exercise tolerated well No report of concerns or symptoms today Strength training completed today  Goals Unmet:  Not Applicable  Comments: Pt able to follow exercise prescription today without complaint.  Will continue to monitor for progression.    Dr. Emily Filbert is Medical Director for [place].  Dr. Ottie Glazier is Medical Director for [facility] Pulmonary Rehabilitation.

## 2021-02-07 ENCOUNTER — Encounter: Payer: Medicare Other | Admitting: *Deleted

## 2021-02-07 ENCOUNTER — Other Ambulatory Visit: Payer: Self-pay

## 2021-02-07 DIAGNOSIS — Z951 Presence of aortocoronary bypass graft: Secondary | ICD-10-CM

## 2021-02-07 NOTE — Progress Notes (Signed)
Daily Session Note  Patient Details  Name: Yahshua Thibault MRN: [id number] Date of Birth: 1950/11/01 Referring Provider:   Flowsheet Row Cardiac Rehab from 12/31/2020 in [facility] Cardiac and Pulmonary Rehab  Referring Provider [name] MD       Encounter Date: 02/07/2021  Check In:  Session Check In - 02/07/21 0915       Check-In   Supervising physician immediately available to respond to emergencies See telemetry face sheet for immediately available ER MD    Location [facility] Re[facility]    Staff Present Nyoka Cowden, RN, BSN, [place], BS, ACSM CEP, Exercise Physiologist;Amanda Oletta Darter, [place], ACSM CEP, Exercise Physiologist    Virtual Visit No    Medication changes reported     No    Fall or balance concerns reported    No    Warm-up and Cool-down Performed on first and last piece of equipment    Resistance Training Performed Yes    VAD Patient? No    PAD/SET Patient? No      Pain Assessment   Currently in Pain? No/denies                Social History   Tobacco Use  Smoking Status Former   Packs/day: 1.50   Years: 40.00   Pack years: 60.00   Types: Cigarettes   Quit date: 09/14/2007   Years since quitting: 13.4  Smokeless Tobacco Never  Tobacco Comments   quit 2009    Goals Met:  Proper associated with RPD/PD & O2 Sat Independence with exercise equipment Using PLB without cueing & demonstrates good technique Exercise tolerated well No report of concerns or symptoms today Strength training completed today  Goals Unmet:  Not Applicable  Comments: Pt able to follow exercise prescription today without complaint.  Will continue to monitor for progression.    Dr. Emily Filbert is Medical Director for [place].  Dr. Ottie Glazier is Medical Director for [facility] Pulmonary Rehabilitation.

## 2021-02-12 ENCOUNTER — Encounter: Payer: Medicare Other | Attending: Cardiovascular Disease

## 2021-02-12 ENCOUNTER — Other Ambulatory Visit: Payer: Self-pay

## 2021-02-12 DIAGNOSIS — Z951 Presence of aortocoronary bypass graft: Secondary | ICD-10-CM | POA: Diagnosis not present

## 2021-02-12 DIAGNOSIS — Z87891 Personal history of nicotine dependence: Secondary | ICD-10-CM | POA: Diagnosis not present

## 2021-02-12 NOTE — Progress Notes (Signed)
Daily Session Note  Patient Details  Name: [name] MRN: [id number] Date of Birth: [birth year] Referring Provider:   Flowsheet Row Cardiac Rehab from 12/31/2020 in [facility] Cardiac and Pulmonary Rehab  Referring Provider [name] MD       Encounter Date: 02/12/2021  Check In:  Session Check In - 02/12/21 0916       Check-In   Supervising physician immediately available to respond to emergencies See telemetry face sheet for immediately available ER MD    Location [facility] Pul[facility] Re[facility]    Staff Present Birdie Sons, MPA, RN;Amanda Oletta Darter, BA, ACSM CEP, Exercise Physiologist;Jessica Luan Pulling, MA, RCEP, CCRP, CCET    Virtual Visit No    Medication changes reported     No    Fall or balance concerns reported    No    Warm-up and Cool-down Performed on first and last piece of equipment    Resistance Training Performed Yes    VAD Patient? No    PAD/SET Patient? No      Pain Assessment   Currently in Pain? No/denies                Social History   Tobacco Use  Smoking Status Former   Packs/day: 1.50   Years: 40.00   Pack years: 60.00   Types: Cigarettes   Quit date: 09/14/2007   Years since quitting: 13.4  Smokeless Tobacco Never  Tobacco Comments   quit 2009    Goals Met:  Independence with exercise equipment Exercise tolerated well No report of concerns or symptoms today Strength training completed today  Goals Unmet:  Not Applicable  Comments: Pt able to follow exercise prescription today without complaint.  Will continue to monitor for progression.    Dr. Emily Filbert is Medical Director for [place].  Dr. Ottie Glazier is Medical Director for [facility] Pulmonary Rehabilitation.

## 2021-02-13 ENCOUNTER — Encounter: Payer: Self-pay | Admitting: *Deleted

## 2021-02-13 DIAGNOSIS — Z951 Presence of aortocoronary bypass graft: Secondary | ICD-10-CM

## 2021-02-13 NOTE — Progress Notes (Signed)
Cardiac Individual Treatment Plan  Patient Details  Name: [name] MRN: [id number] Date of Birth: Dec 06, 1950 Referring Provider:   Flowsheet Row Cardiac Rehab from 12/31/2020 in [facility] Cardiac and Pulmonary Rehab  Referring Provider [name] MD       Initial Encounter Date:  Flowsheet Row Cardiac Rehab from 12/31/2020 in [facility] Cardiac and Pulmonary Rehab  Date 12/31/20       Visit Diagnosis: S/P CABG x 5  Patient's Home Medications on Admission:  Current Outpatient Medications:    aspirin EC 325 MG EC tablet, Take 1 tablet (325 mg total) by mouth daily., Disp: 30 tablet, Rfl: 0   atorvastatin (LIPITOR) 80 MG tablet, TAKE 1 TABLET DAILY, Disp: 90 tablet, Rfl: 0   carvedilol (COREG) 6.25 MG tablet, TAKE 1 TABLET TWICE A DAY WITH MEALS, Disp: 180 tablet, Rfl: 2   colchicine 0.6 MG tablet, Take 0.5 tablets (0.3 mg total) by mouth 2 (two) times daily., Disp: 30 tablet, Rfl: 0   diphenhydramine-acetaminophen (TYLENOL PM) 25-500 MG TABS tablet, Take 2 tablets by mouth at bedtime., Disp: , Rfl:    Dulaglutide (TRULICITY) 5.11 MY/1.1ZN SOPN, Inject 0.75 mg into the skin once a week., Disp: 6 mL, Rfl: 1   ezetimibe (ZETIA) 10 MG tablet, Take 1 tablet (10 mg total) by mouth daily., Disp: 90 tablet, Rfl: 3   furosemide (LASIX) 40 MG tablet, Take 1 tablet (40 mg total) by mouth daily., Disp: 90 tablet, Rfl: 0   furosemide (LASIX) 40 MG tablet, , Disp: , Rfl:    isosorbide mononitrate (IMDUR) 30 MG 24 hr tablet, Take 1 tablet (30 mg total) by mouth daily., Disp: 90 tablet, Rfl: 0   JARDIANCE 25 MG TABS tablet, TAKE 1 TABLET DAILY, Disp: 90 tablet, Rfl: 3   metFORMIN (GLUCOPHAGE) 1000 MG tablet, TAKE 1 TABLET TWICE A DAY WITH A MEAL, Disp: 180 tablet, Rfl: 3   Multiple Vitamins-Minerals (MULTIVITAMINS THER. W/MINERALS) TABS, Take 1 tablet by mouth every morning., Disp: , Rfl:    niacin (NIASPAN) 1000 MG CR tablet, TAKE 2 TABLETS DAILY AT BEDTIME, Disp: 180 tablet, Rfl: 2    potassium chloride 20 MEQ/100ML IVPB, , Disp: , Rfl:    potassium chloride SA (KLOR-CON) 20 MEQ tablet, Take 1 tablet (20 mEq total) by mouth 2 (two) times daily., Disp: 180 tablet, Rfl: 0   sitaGLIPtin (JANUVIA) 100 MG tablet, Take 100 mg by mouth daily., Disp: , Rfl:    traMADol (ULTRAM) 50 MG tablet, Take 1 tablet (50 mg total) by mouth every 6 (six) hours as needed for moderate pain., Disp: 28 tablet, Rfl: 0  Past Medical History: Past Medical History:  Diagnosis Date   Angina    Asthma    BCC (basal cell carcinoma of skin) 09/06/2020   left upper back lateral (EDC) ,  left upper arm anterior (EDC), left upper back medial (EDC)    Bursitis of left elbow 2018   Diabetes mellitus without complication ([place])    ED (erectile dysfunction)    HTN (hypertension) 04/12/2011   Hyperlipemia 04/12/2011   Ischemic cardiomyopathy    a. echo 2010: EF 45-50%, mild to mod ant and apical wall HK, trace MR; b. cardiac cath 06/2012: EF 40%, mild MR    Multiple vessel coronary artery disease 04/12/2011   a. remote MI 1996; b. MI 1997 s/p PCI - LAD & diag; c. MI 2009: long stenting P-MLAD & Diag & POBA of jailed diag branch; d. cath 2010: 80% ISR of LAD  w/ L-R collats, med Rx; e. cath 2012: 50-60% tub mLAD, 60-70% dLAD, FFR 0.75 -->s/p PCI dissection => w/ 2 stents (3 total); f. cath 06/2012: no changes from 2012 cath, med Rx, patent stents -> MV CAD 11/20/20 --> CABG X 5   Myocardial infarction (HCC)    x 5   Obesity    OSA (obstructive sleep apnea)    on CPAP   Osteoarthritis    Renal disorder    kidney stone    Tobacco Use: Social History   Tobacco Use  Smoking Status Former   Packs/day: 1.50   Years: 40.00   Pack years: 60.00   Types: Cigarettes   Quit date: 09/14/2007   Years since quitting: 13.4  Smokeless Tobacco Never  Tobacco Comments   quit 2009    Labs: Recent Review Flowsheet Data     Labs for ITP Cardiac and Pulmonary Rehab Latest Ref Rng & Units 11/20/2020 11/20/2020 11/20/2020  11/20/2020 11/20/2020   Cholestrol 0 - 200 mg/dL - - - - -   LDLCALC 0 - 99 mg/dL - - - - -   LDLDIRECT mg/dL - - - - -   HDL >40 mg/dL - - - - -   Trlycerides <150 mg/dL - - - - -   Hemoglobin A1c 4.8 - 5.6 % - - - - -   PHART 7.350 - 7.450 - 7.331(L) 7.335(L) 7.319(L) 7.343(L)   PCO2ART 32.0 - 48.0 mmHg - 42.9 40.0 31.8(L) 40.1   HCO3 20.0 - 28.0 mmol/L - 23.0 21.3 16.4(L) 21.8   TCO2 22 - 32 mmol/L _0 17(L) 23   ACIDBASEDEF 0.0 - 2.0 mmol/L - 3.0(H) 4.0(H) 9.0(H) 4.0(H)   O2SAT % - 96.0 99.0 99.0 95.0        Exercise Target Goals: Exercise Program Goal: Individual exercise prescription set using results from initial 6 min walk test and THRR while considering  patient's activity barriers and safety.   Exercise Prescription Goal: Initial exercise prescription builds to 30-45 minutes a day of aerobic activity, 2-3 days per week.  Home exercise guidelines will be given to patient during program as part of exercise prescription that the participant will acknowledge.   Education: Aerobic Exercise: - Group verbal and visual presentation on the components of exercise prescription. Introduces F.I.T.T principle from ACSM for exercise prescriptions.  Reviews F.I.T.T. principles of aerobic exercise including progression. Written material given at graduation. Flowsheet Row Cardiac Rehab from 02/07/2021 in [facility] Cardiac and Pulmonary Rehab  Date 01/03/21  Educator [facility]  Instruction Review Code 1- Verbalizes Understanding       Education: Resistance Exercise: - Group verbal and visual presentation on the components of exercise prescription. Introduces F.I.T.T principle from ACSM for exercise prescriptions  Reviews F.I.T.T. principles of resistance exercise including progression. Written material given at graduation. Flowsheet Row Cardiac Rehab from 02/07/2021 in [facility] Cardiac and Pulmonary Rehab  Date 01/10/21  Educator [employer]  Instruction Review Code 1- Verbalizes Understanding         Education: Exercise & Equipment Safety: - Individual verbal instruction and demonstration of equipment use and safety with use of the equipment. Flowsheet Row Cardiac Rehab from 02/07/2021 in [facility] Cardiac and Pulmonary Rehab  Date 12/26/20  Educator [facility]  Instruction Review Code 1- Verbalizes Understanding       Education: Exercise Physiology & General Exercise Guidelines: - Group verbal and written instruction with models to review the exercise physiology of the cardiovascular system and associated critical values. Provides general exercise  guidelines with specific guidelines to those with heart or lung disease.    Education: Flexibility, Balance, Mind/Body Relaxation: - Group verbal and visual presentation with interactive activity on the components of exercise prescription. Introduces F.I.T.T principle from ACSM for exercise prescriptions. Reviews F.I.T.T. principles of flexibility and balance exercise training including progression. Also discusses the mind body connection.  Reviews various relaxation techniques to help reduce and manage stress (i.e. Deep breathing, progressive muscle relaxation, and visualization). Balance handout provided to take home. Written material given at graduation. Flowsheet Row Cardiac Rehab from 02/07/2021 in [facility] Cardiac and Pulmonary Rehab  Date 01/17/21  Educator [facility]  Instruction Review Code 1- Verbalizes Understanding       Activity Barriers & Risk Stratification:  Activity Barriers & Cardiac Risk Stratification - 12/31/20 1155       Activity Barriers & Cardiac Risk Stratification   Activity Barriers Deconditioning;Muscular Weakness;Joint Problems;History of Falls;Other (comment);Incisional Pain    Comments occasional left hip pain, vein grafts still painful at times    Cardiac Risk Stratification High             6 Minute Walk:  6 Minute Walk     Row Name 12/31/20 1154         6 Minute Walk   Phase Initial     Distance 1445 feet      Walk Time 6 minutes     # of Rest Breaks 0     MPH 2.74     METS 3.33     RPE 9     Perceived Dyspnea  0     VO2 Peak 11.67     Symptoms Yes (comment)     Comments chronic left hip pain 2/10     Resting HR 83 bpm     Resting BP 126/74     Resting Oxygen Saturation  97 %     Exercise Oxygen Saturation  during 6 min walk 98 %     Max Ex. HR 107 bpm     Max Ex. BP 142/70     2 Minute Post BP 122/66              Oxygen Initial Assessment:   Oxygen Re-Evaluation:   Oxygen Discharge (Final Oxygen Re-Evaluation):   Initial Exercise Prescription:  Initial Exercise Prescription - 12/31/20 1100       Date of Initial Exercise RX and Referring Provider   Date 12/31/20    Referring Provider [name] MD      Treadmill   MPH 2.7    Grade 1    Minutes 15    METs 3.44      NuStep   Level 3    SPM 80    Minutes 15    METs 3.3      Elliptical   Level 1    Speed 3    Minutes 15    METs 3      Prescription Details   Frequency (times per week) 2    Duration Progress to 30 minutes of continuous aerobic without signs/symptoms of physical distress      Intensity   THRR 40-80% of Max Heartrate 110-137    Ratings of Perceived Exertion 11-13    Perceived Dyspnea 0-4      Progression   Progression Continue to progress workloads to maintain intensity without signs/symptoms of physical distress.      Resistance Training   Training Prescription Yes    Weight 4 lb  Reps 10-15             Perform Capillary Blood Glucose checks as needed.  Exercise Prescription Changes:   Exercise Prescription Changes     Row Name 12/31/20 1100 01/07/21 1200 01/10/21 0900 01/23/21 1100 02/04/21 1000     Response to Exercise   Blood Pressure (Admit) 126/74 122/70 -- 124/64 114/56   Blood Pressure (Exercise) 142/70 132/72 -- 146/68 --   Blood Pressure (Exit) 122/66 126/64 -- 118/64 114/64   Heart Rate (Admit) 83 bpm 94 bpm -- 78 bpm 89 bpm   Heart Rate (Exercise) 107  bpm 122 bpm -- 124 bpm 127 bpm   Heart Rate (Exit) 68 bpm 104 bpm -- 90 bpm 106 bpm   Oxygen Saturation (Admit) 97 % -- -- -- --   Oxygen Saturation (Exercise) 98 % -- -- -- --   Rating of Perceived Exertion (Exercise) 9 12 -- 15 13   Perceived Dyspnea (Exercise) 0 -- -- -- --   Symptoms left hip pain 2/10 none -- none none   Comments walk test results second full day of exercise -- -- --   Duration -- Progress to 30 minutes of  aerobic without signs/symptoms of physical distress -- Continue with 30 min of aerobic exercise without signs/symptoms of physical distress. Continue with 30 min of aerobic exercise without signs/symptoms of physical distress.   Intensity -- THRR unchanged -- THRR unchanged THRR unchanged     Progression   Progression -- Continue to progress workloads to maintain intensity without signs/symptoms of physical distress. -- Continue to progress workloads to maintain intensity without signs/symptoms of physical distress. Continue to progress workloads to maintain intensity without signs/symptoms of physical distress.   Average METs -- 2.97 -- 4.6 3.83     Resistance Training   Training Prescription -- Yes -- Yes Yes   Weight -- 4 lb -- 4 lb 4 lb   Reps -- 10-15 -- 10-15 10-15     Interval Training   Interval Training -- No -- No No     Treadmill   MPH -- 3 -- 3.4 3.5   Grade -- 1.5 -- 1.5 1.5   Minutes -- 15 -- 15 15   METs -- 3.92 -- 4.31 4.4     NuStep   Level -- 3 -- 4 4   Minutes -- 15 -- 15 15   METs -- 2.9 -- 4.9 4.8     Recumbant Elliptical   Level -- -- -- -- 3.2   Minutes -- -- -- -- 15   METs -- -- -- -- 2.1     Elliptical   Level -- 1 -- -- --   Minutes -- 15 -- -- --   METs -- 2.1 -- -- --     Home Exercise Plan   Plans to continue exercise at -- -- [employer] (comment)  [employer] (comment)  [employer] (comment)  [employer]   Frequency -- -- Add 3 additional days to program  exercise sessions. Add 3 additional days to program exercise sessions. Add 3 additional days to program exercise sessions.   Initial Home Exercises Provided -- -- 01/10/21 01/10/21 01/10/21            Exercise Comments:   Exercise Comments     Row Name 01/01/21 [phone]           Exercise Comments First full day of exercise!  Patient was oriented to gym and  equipment including functions, settings, policies, and procedures.  Patient's individual exercise prescription and treatment plan were reviewed.  All starting workloads were established based on the results of the 6 minute walk test done at initial orientation visit.  The plan for exercise progression was also introduced and progression will be customized based on patient's performance and goals.                Exercise Goals and Review:   Exercise Goals     Row Name 12/31/20 1158             Exercise Goals   Increase Physical Activity Yes       Intervention Provide advice, education, support and counseling about physical activity/exercise needs.;Develop an individualized exercise prescription for aerobic and resistive training based on initial evaluation findings, risk stratification, comorbidities and participant's personal goals.       Expected Outcomes Short Term: Attend rehab on a regular basis to increase amount of physical activity.;Long Term: Add in home exercise to make exercise part of routine and to increase amount of physical activity.;Long Term: Exercising regularly at least 3-5 days a week.       Increase Strength and Stamina Yes       Intervention Provide advice, education, support and counseling about physical activity/exercise needs.;Develop an individualized exercise prescription for aerobic and resistive training based on initial evaluation findings, risk stratification, comorbidities and participant's personal goals.       Expected Outcomes Short Term: Increase workloads from initial exercise prescription  for resistance, speed, and METs.;Short Term: Perform resistance training exercises routinely during rehab and add in resistance training at home;Long Term: Improve cardiorespiratory fitness, muscular endurance and strength as measured by increased METs and functional capacity (6MWT)       Able to understand and use rate of perceived exertion (RPE) scale Yes       Intervention Provide education and explanation on how to use RPE scale       Expected Outcomes Short Term: Able to use RPE daily in rehab to express subjective intensity level;Long Term:  Able to use RPE to guide intensity level when exercising independently       Able to understand and use Dyspnea scale Yes       Intervention Provide education and explanation on how to use Dyspnea scale       Expected Outcomes Short Term: Able to use Dyspnea scale daily in rehab to express subjective sense of shortness of breath during exertion;Long Term: Able to use Dyspnea scale to guide intensity level when exercising independently       Knowledge and understanding of Target Heart Rate Range (THRR) Yes       Intervention Provide education and explanation of THRR including how the numbers were predicted and where they are located for reference       Expected Outcomes Short Term: Able to state/look up THRR;Short Term: Able to use daily as guideline for intensity in rehab;Long Term: Able to use THRR to govern intensity when exercising independently       Able to check pulse independently Yes       Intervention Provide education and demonstration on how to check pulse in carotid and radial arteries.;Review the importance of being able to check your own pulse for safety during independent exercise       Expected Outcomes Short Term: Able to explain why pulse checking is important during independent exercise;Long Term: Able to check pulse independently and accurately  Understanding of Exercise Prescription Yes       Intervention Provide education,  explanation, and written materials on patient's individual exercise prescription       Expected Outcomes Short Term: Able to explain program exercise prescription;Long Term: Able to explain home exercise prescription to exercise independently                Exercise Goals Re-Evaluation :  Exercise Goals Re-Evaluation     Row Name 01/01/21 0300 01/07/21 1208 01/10/21 0956 01/22/21 0936 02/04/21 1105     Exercise Goal Re-Evaluation   Exercise Goals Review Increase Physical Activity;Able to understand and use rate of perceived exertion (RPE) scale;Knowledge and understanding of Target Heart Rate Range (THRR);Understanding of Exercise Prescription;Able to understand and use Dyspnea scale;Able to check pulse independently;Increase Strength and Stamina Increase Physical Activity;Increase Strength and Stamina Increase Physical Activity;Increase Strength and Stamina;Understanding of Exercise Prescription Increase Physical Activity;Increase Strength and Stamina Increase Physical Activity;Increase Strength and Stamina   Comments Reviewed RPE and dyspnea scales, THR and program prescription with pt today.  Pt voiced understanding and was given a copy of goals to take home. [name] is doing well the first couple sessions that he has been here. He has already increased his treadmill speed and incline to 3/ 1.5%. We will continue to increase his workloads as he progresses throughout the program. Will continue to monitor. Reviewed home exercise with pt today.  Pt plans to walk and go to [employer] for exercise.  He is also planning to do some weights on his own at home too. Reviewed THR, pulse, RPE, sign and symptoms, pulse oximetery and when to call 911 or MD.  Also discussed weather considerations and indoor options.  Pt voiced understanding. [name] has started doing aerobic and balance exercises last week. He is not checking HR  outside sessions. He wants to be able to ride his motorcycle here.  His Dr said it  may take 3 months to get back on his bike and it has been over 2.  He wants to try riding soon.  This staff recommended starting with short rides first. [name] is doing well in rehab. He increased his loads on the treadmill to 3.5 speed and 1.5% incline. Will continue to monitor his progression.   Expected Outcomes Short: Use RPE daily to regulate intensity. Long: Follow program prescription in THR. Short: Increase workloads as tolerated Long: Continue to increase overall MET level Short: Return to [employer] on off days Long: Continue to improve stamina Short: continue to work on balance and check HR while exercising Long: get back to riding motorcycle Short: Continue building up tolerance on treadmill Long: Continue to increase overall MET level            Discharge Exercise Prescription (Final Exercise Prescription Changes):  Exercise Prescription Changes - 02/04/21 1000       Response to Exercise   Blood Pressure (Admit) 114/56    Blood Pressure (Exit) 114/64    Heart Rate (Admit) 89 bpm    Heart Rate (Exercise) 127 bpm    Heart Rate (Exit) 106 bpm    Rating of Perceived Exertion (Exercise) 13    Symptoms none    Duration Continue with 30 min of aerobic exercise without signs/symptoms of physical distress.    Intensity THRR unchanged      Progression   Progression Continue to progress workloads to maintain intensity without signs/symptoms of physical distress.    Average METs 3.83  Resistance Training   Training Prescription Yes    Weight 4 lb    Reps 10-15      Interval Training   Interval Training No      Treadmill   MPH 3.5    Grade 1.5    Minutes 15    METs 4.4      NuStep   Level 4    Minutes 15    METs 4.8      Recumbant Elliptical   Level 3.2    Minutes 15    METs 2.1      Home Exercise Plan   Plans to continue exercise at [employer] (comment)   [employer]   Frequency Add 3 additional days to program exercise sessions.    Initial  Home Exercises Provided 01/10/21             Nutrition:  Target Goals: Understanding of nutrition guidelines, daily intake of sodium <1570m, cholesterol <2082m calories 30% from fat and 7% or less from saturated fats, daily to have 5 or more servings of fruits and vegetables.  Education: All About Nutrition: -Group instruction provided by verbal, written material, interactive activities, discussions, models, and posters to present general guidelines for heart healthy nutrition including fat, fiber, MyPlate, the role of sodium in heart healthy nutrition, utilization of the nutrition label, and utilization of this knowledge for meal planning. Follow up email sent as well. Written material given at graduation. [facility]  Education need identified 12/31/20  Date 01/31/21  [place] Code 1- Verbalizes Understanding       Biometrics:  Pre Biometrics - 12/31/20 1158       Pre Biometrics   Height 5' 8.6" (1.742 m)    Weight 184 lb (83.5 kg)    BMI (Calculated) 27.5    Single Leg Stand 17.5 seconds              Nutrition Therapy Plan and Nutrition Goals:  Nutrition Therapy & Goals - 01/07/21 0800       Nutrition Therapy   Diet Heart healthy, low Na, diabetes friendly    Drug/Food Interactions Statins/Certain Fruits    Protein (specify units) 65g    Fiber 30 grams    Whole Grain Foods 3 servings    Saturated Fats 12 max. grams    Fruits and Vegetables 8 servings/day    Sodium 1.5 grams      Personal Nutrition Goals   Nutrition Goal ST: Continue with ST goal made to reduce added sugar, practice CHO counting, pair CHO foods with protien and fat - ex: fruit with nuts LT: manage carbohydrates (choose complex CHO most often, 2-3 CHO for meals, 1-2 CHO for snacks, pair CHO with protein and/or fat)    Comments He has seen a nutritionist previously and he has been slowly incorporating her  tips. PYP score 72.  T2DM, no insulin. He feels he eats too mnay carbs, but he is monitoring sugar - he is not eating so many sweets. He would then like to start working on his carbs. Varied diet. B: crsipy oat squares with strawberries and blueberries and banana and 2% milk L: can have leftovers - his wife likes to eat out more than him: 1-2x/week [place] [employer] [place] (does not want to change this). D: cooks: tonight flounder and hushpuppy mix. He prefers fresh or frozen vegetables with his meals. S: red pears and  other fruit during the day Drinks: water and sometimes cheerwine Reviewed heart healthy eating and diabetes friendly eating.      Intervention Plan   Intervention Prescribe, educate and counsel regarding individualized specific dietary modifications aiming towards targeted core components such as weight, hypertension, lipid management, diabetes, heart failure and other comorbidities.;Nutrition handout(s) given to patient.    Expected Outcomes Short Term Goal: Understand basic principles of dietary content, such as calories, fat, sodium, cholesterol and nutrients.;Short Term Goal: A plan has been developed with personal nutrition goals set during dietitian appointment.;Long Term Goal: Adherence to prescribed nutrition plan.             Nutrition Assessments:  MEDIFICTS Score Key: ?70 Need to make dietary changes  40-70 Heart Healthy Diet ? 40 Therapeutic Level Cholesterol Diet  Flowsheet Row Cardiac Rehab from 12/31/2020 in [facility] Cardiac and Pulmonary Rehab  Picture Your Plate Total Score on Admission 72      Picture Your Plate Scores: <51 Unhealthy dietary pattern with much room for improvement. 41-50 Dietary pattern unlikely to meet recommendations for good health and room for improvement. 51-60 More healthful dietary pattern, with some room for improvement.  >60 Healthy dietary pattern, although there may be some specific behaviors that could be improved.     Nutrition Goals Re-Evaluation:  Nutrition Goals Re-Evaluation     Row Name 01/22/21 0940             Goals   Comment Craven says he hasnt really been counting carbs yet.  He states he does remember how to read a food label.  he does only eat whole grain bread etc.  He also likes beans.       Expected Outcome Short:  focus on healthy choices Long: maintain heart healthy diet                Nutrition Goals Discharge (Final Nutrition Goals Re-Evaluation):  Nutrition Goals Re-Evaluation - 01/22/21 0940       Goals   Comment [name] says he hasnt really been counting carbs yet.  He states he does remember how to read a food label.  he does only eat whole grain bread etc.  He also likes beans.    Expected Outcome Short:  focus on healthy choices Long: maintain heart healthy diet             Psychosocial: Target Goals: Acknowledge presence or absence of significant depression and/or stress, maximize coping skills, provide positive support system. Participant is able to verbalize types and ability to use techniques and skills needed for reducing stress and depression.   Education: Stress, Anxiety, and Depression - Group verbal and visual presentation to define topics covered.  Reviews how body is impacted by stress, anxiety, and depression.  Also discusses healthy ways to reduce stress and to treat/manage anxiety and depression.  Written material given at graduation.   Education: Sleep Hygiene -Provides group verbal and written instruction about how sleep can affect your health.  Define sleep hygiene, discuss sleep cycles and impact of sleep habits. Review good sleep hygiene tips.    Initial Review & Psychosocial Screening:  Initial Psych Review & Screening - 12/26/20 0926       Initial Review   Current issues with History of Depression      Family Dynamics   Good Support System? Yes    Comments His wife and friends are a good support system for him. He had a history  of depression but has since  then been ok.      Barriers   Psychosocial barriers to participate in program There are no identifiable barriers or psychosocial needs.;The patient should benefit from training in stress management and relaxation.      Screening Interventions   Interventions Encouraged to exercise;Provide feedback about the scores to participant;To provide support and resources with identified psychosocial needs    Expected Outcomes Short Term goal: Utilizing psychosocial counselor, staff and physician to assist with identification of specific Stressors or current issues interfering with healing process. Setting desired goal for each stressor or current issue identified.;Long Term Goal: Stressors or current issues are controlled or eliminated.;Short Term goal: Identification and review with participant of any Quality of Life or Depression concerns found by scoring the questionnaire.;Long Term goal: The participant improves quality of Life and PHQ9 Scores as seen by post scores and/or verbalization of changes             Quality of Life Scores:   Quality of Life - 12/31/20 1159       Quality of Life   Select Quality of Life      Quality of Life Scores   Health/Function Pre 21.47 %    Socioeconomic Pre 26.29 %    Psych/Spiritual Pre 24.07 %    Family Pre 22.4 %    GLOBAL Pre 23.13 %            Scores of 19 and below usually indicate a poorer quality of life in these areas.  A difference of  2-3 points is a clinically meaningful difference.  A difference of 2-3 points in the total score of the Quality of Life Index has been associated with significant improvement in overall quality of life, Malone-image, physical symptoms, and general health in studies assessing change in quality of life.  PHQ-9: Recent Review Flowsheet Data     Depression screen [facility] 2/9 12/31/2020 06/06/2020 05/29/2020 10/18/2019 06/06/2019   Decreased Interest 0 0 0 0 0   Down, Depressed, Hopeless 0 0 0 0 0    PHQ - 2 Score 0 0 0 0 0   Altered sleeping 0 - - - -   Tired, decreased energy 2 - - - -   Change in appetite 1 - - - -   Feeling bad or failure about yourself  0 - - - -   Trouble concentrating 0 - - - -   Moving slowly or fidgety/restless 0 - - - -   Suicidal thoughts 0 - - - -   PHQ-9 Score 3 - - - -   Difficult doing work/chores Not difficult at all - - - -      Interpretation of Total Score  Total Score Depression Severity:  1-4 = Minimal depression, 5-9 = Mild depression, 10-14 = Moderate depression, 15-19 = Moderately severe depression, 20-27 = Severe depression   Psychosocial Evaluation and Intervention:  Psychosocial Evaluation - 12/26/20 0930       Psychosocial Evaluation & Interventions   Interventions Encouraged to exercise with the program and follow exercise prescription;Stress management education;Relaxation education    Comments His wife and friends are a good support system for him. He had a history of depression but has since then been ok.    Expected Outcomes Short: Exercise regularly to support mental health and notify staff of any changes. Long: maintain mental health and well being through teaching of rehab or prescribed medications independently.    Continue Psychosocial Services  Follow up required by  staff             Psychosocial Re-Evaluation:  Psychosocial Re-Evaluation     [place] Name 01/22/21 [phone]             Psychosocial Re-Evaluation   Current issues with Current Stress Concerns       Comments [name] really wants to get back to riding his motorcycle.  He finds this very relaxing.  He plans to try a short ride sometime soon.  He does keep stress low by learning  to not get upset.       Expected Outcomes Short: get a bike ride soon Long: maintain positive outlook                Psychosocial Discharge (Final Psychosocial Re-Evaluation):  Psychosocial Re-Evaluation - 01/22/21 0944       Psychosocial Re-Evaluation   Current issues  with Current Stress Concerns    Comments [name] really wants to get back to riding his motorcycle.  He finds this very relaxing.  He plans to try a short ride sometime soon.  He does keep stress low by learning  to not get upset.    Expected Outcomes Short: get a bike ride soon Long: maintain positive outlook             Vocational Rehabilitation: Provide vocational rehab assistance to qualifying candidates.   Vocational Rehab Evaluation & Intervention:   Education: Education Goals: Education classes will be provided on a variety of topics geared toward better understanding of heart health and risk factor modification. Participant will state understanding/return demonstration of topics presented as noted by education test scores.  Learning Barriers/Preferences:  Learning Barriers/Preferences - 12/26/20 0925       Learning Barriers/Preferences   Learning Barriers None    Learning Preferences None             General Cardiac Education Topics:  AED/CPR: - Group verbal and written instruction with the use of models to demonstrate the basic use of the AED with the basic ABC's of resuscitation.   Anatomy and Cardiac Procedures: - Group verbal and visual presentation and models provide information about basic cardiac anatomy and function. Reviews the testing methods done to diagnose heart disease and the outcomes of the test results. Describes the treatment choices: Medical Management, Angioplasty, or Coronary Bypass Surgery for treating various heart conditions including Myocardial Infarction, Angina, Valve Disease, and Cardiac Arrhythmias.  Written material given at graduation. Flowsheet Row Cardiac Rehab from 02/07/2021 in [facility] Cardiac and Pulmonary Rehab  Date 01/10/21  Educator KB  Instruction Review Code 1- Verbalizes Understanding       Medication Safety: - Group verbal and visual instruction to review commonly prescribed medications for heart and lung disease. Reviews  the medication, class of the drug, and side effects. Includes the steps to properly store meds and maintain the prescription regimen.  Written material given at graduation.   Intimacy: - Group verbal instruction through game format to discuss how heart and lung disease can affect sexual intimacy. Written material given at graduation.. Flowsheet Row Card[facility]  Date 01/03/21  Educator [facility]  Instruction Review Code 1- Verbalizes Understanding       Know Your Numbers and Heart Failure: - Group verbal and visual instruction to discuss disease risk factors for cardiac and pulmonary disease and treatment options.  Reviews associated critical values for Overweight/Obesity, Hypertension, Cholesterol, and Diabetes.  Discusses basics of heart failure: signs/symptoms and  treatments.  Introduces Heart Failure Zone chart for action plan for heart failure.  Written material given at graduation. Flowsheet Row Cardiac Rehab from 02/07/2021 in [facility] Cardiac and Pulmonary Rehab  Education need identified 12/31/20  Date 02/07/21  Educator KB  Instruction Review Code 1- Verbalizes Understanding       Infection Prevention: - Provides verbal and written material to individual with discussion of infection control including proper hand washing and proper equipment cleaning during exercise session. Flowsheet Row Cardiac Rehab from 02/07/2021 in [facility] Cardiac and Pulmonary Rehab  Date 12/26/20  Educator [facility]  Instruction Review Code 1- Verbalizes Understanding       Falls Prevention: - Provides verbal and written material to individual with discussion of falls prevention and safety. Flowsheet Row Cardiac Rehab from 02/07/2021 in [facility] Cardiac and Pulmonary Rehab  Date 12/26/20  Educator Cherylann Banas  Instruction Review Code 1- Verbalizes Understanding       Other: -Provides group and verbal instruction on various topics (see comments)   Knowledge Questionnaire  Score:  Knowledge Questionnaire Score - 12/31/20 1200       Knowledge Questionnaire Score   Pre Score 24/26 Education Focus: Smoking, Nutrition             Core Components/Risk Factors/Patient Goals at Admission:  Personal Goals and Risk Factors at Admission - 12/31/20 1200       Core Components/Risk Factors/Patient Goals on Admission    Weight Management Yes;Weight Loss    Intervention Weight Management: Develop a combined nutrition and exercise program designed to reach desired caloric intake, while maintaining appropriate intake of nutrient and fiber, sodium and fats, and appropriate energy expenditure required for the weight goal.;Weight Management: Provide education and appropriate resources to help participant work on and attain dietary goals.;Weight Management/Obesity: Establish reasonable short term and long term weight goals.    Admit Weight 184 lb (83.5 kg)    Goal Weight: Short Term 180 lb (81.6 kg)    Goal Weight: Long Term 174 lb (78.9 kg)    Expected Outcomes Short Term: Continue to assess and modify interventions until short term weight is achieved;Long Term: Adherence to nutrition and physical activity/exercise program aimed toward attainment of established weight goal;Weight Loss: Understanding of general recommendations for a balanced deficit meal plan, which promotes 1-2 lb weight loss per week and includes a negative energy balance of [phone] kcal/d;Understanding recommendations for meals to include 15-35% energy as protein, 25-35% energy from fat, 35-60% energy from carbohydrates, less than 255m of dietary cholesterol, 20-35 gm of total fiber daily;Understanding of distribution of calorie intake throughout the day with the consumption of 4-5 meals/snacks    Diabetes Yes    Intervention Provide education about signs/symptoms and action to take for hypo/hyperglycemia.;Provide education about proper nutrition, including hydration, and aerobic/resistive exercise  prescription along with prescribed medications to achieve blood glucose in normal ranges: Fasting glucose 65-99 mg/dL    Expected Outcomes Long Term: Attainment of HbA1C < 7%.;Short Term: Participant verbalizes understanding of the signs/symptoms and immediate care of hyper/hypoglycemia, proper foot care and importance of medication, aerobic/resistive exercise and nutrition plan for blood glucose control.    Heart Failure Yes    Intervention Provide a combined exercise and nutrition program that is supplemented with education, support and counseling about heart failure. Directed toward relieving symptoms such as shortness of breath, decreased exercise tolerance, and extremity edema.    Expected Outcomes Improve functional capacity of life;Short term: Attendance in program 2-3 days a week with increased exercise capacity.  Reported lower sodium intake. Reported increased fruit and vegetable intake. Reports medication compliance.;Short term: Daily weights obtained and reported for increase. Utilizing diuretic protocols set by physician.;Long term: Adoption of Malone-care skills and reduction of barriers for early signs and symptoms recognition and intervention leading to Malone-care maintenance.    Hypertension Yes    Intervention Provide education on lifestyle modifcations including regular physical activity/exercise, weight management, moderate sodium restriction and increased consumption of fresh fruit, vegetables, and low fat dairy, alcohol moderation, and smoking cessation.;Monitor prescription use compliance.    Expected Outcomes Long Term: Maintenance of blood pressure at goal levels.;Short Term: Continued assessment and intervention until BP is < 140/71m HG in hypertensive participants. < 130/864mHG in hypertensive participants with diabetes, heart failure or chronic kidney disease.    Lipids Yes    Intervention Provide education and support for participant on nutrition & aerobic/resistive exercise along  with prescribed medications to achieve LDL <7018mHDL >44m45m  Expected Outcomes Short Term: Participant states understanding of desired cholesterol values and is compliant with medications prescribed. Participant is following exercise prescription and nutrition guidelines.;Long Term: Cholesterol controlled with medications as prescribed, with individualized exercise RX and with personalized nutrition plan. Value goals: LDL < 70mg54mL > 40 mg.             Education:Diabetes - Individual verbal and written instruction to review signs/symptoms of diabetes, desired ranges of glucose level fasting, after meals and with exercise. Acknowledge that pre and post exercise glucose checks will be done for 3 sessions at entry of program. [facility]  Date 12/26/20  Educator [facility] Review Code 1- Verbalizes Understanding       Core Components/Risk Factors/Patient Goals Review:   Goals and Risk Factor Review     Row Name 01/22/21 0930             Core Components/Risk Factors/Patient Goals Review   Personal Goals Review Weight Management/Obesity;Heart Failure;Diabetes;Hypertension       Review RogerJeryn check his weight and BG at home.  His weight is about the same but he feels his appetite is back and he is building muscle back.  He also monitors BP at home.  He wants to "take his life back".  He would like to ride his motocycle here.       Expected Outcomes Short: continue to monitor vitals Long: manage risk factors long term                Core Components/Risk Factors/Patient Goals at Discharge (Final Review):   Goals and Risk Factor Review - 01/22/21 0930       Core Components/Risk Factors/Patient Goals Review   Personal Goals Review Weight Management/Obesity;Heart Failure;Diabetes;Hypertension    Review RogerVenkat check his weight and BG at home.  His weight is about the same but he feels his appetite is  back and he is building muscle back.  He also monitors BP at home.  He wants to "take his life back".  He would like to ride his motocycle here.    Expected Outcomes Short: continue to monitor vitals Long: manage risk factors long term             ITP Comments:  ITP Comments     Row Name 12/26/20 0932 12/31/20 1149 01/01/21 0922 01/07/21 0835 01/16/21 0809   ITP Comments Virtual Visit completed. Patient informed on EP and RD appointment and 6 Minute  walk test. Patient also informed of patient health questionnaires on My Chart. Patient Verbalizes understanding. Visit diagnosis can be found in [facility] 11/15/2020. Completed 6MWT and gym orientation. Initial ITP created and sent for review to Dr. Emily Filbert, Medical Director. First full day of exercise!  Patient was oriented to gym and equipment including functions, settings, policies, and procedures.  Patient's individual exercise prescription and treatment plan were reviewed.  All starting workloads were established based on the results of the 6 minute walk test done at initial orientation visit.  The plan for exercise progression was also introduced and progression will be customized based on patient's performance and goals. Completed initial RD consultation 30 Day review completed. Medical Director ITP review done, changes made as directed, and signed approval by Medical Director.    [place] Name 02/13/21 1443           ITP Comments 30 day review completed. ITP sent to Dr. Emily Filbert, Medical Director of Cardiac Rehab. Continue with ITP unless changes are made by physician.                Comments: 30 day review

## 2021-02-14 ENCOUNTER — Other Ambulatory Visit: Payer: Self-pay

## 2021-02-14 DIAGNOSIS — Z87891 Personal history of nicotine dependence: Secondary | ICD-10-CM | POA: Diagnosis not present

## 2021-02-14 DIAGNOSIS — Z951 Presence of aortocoronary bypass graft: Secondary | ICD-10-CM

## 2021-02-14 NOTE — Progress Notes (Signed)
Daily Session Note  Patient Details  Name: [name] MRN: [id number] Date of Birth: [birth year] Referring Provider:   Flowsheet Row Cardiac Rehab from 12/31/2020 in [facility] Cardiac and Pulmonary Rehab  Referring Provider [name] MD       Encounter Date: 02/14/2021  Check In:  Session Check In - 02/14/21 0922       Check-In   Supervising physician immediately available to respond to emergencies See telemetry face sheet for immediately available ER MD    Location [facility]-Cardiac & Pulmonary Rehab    Staff Present Birdie Sons, MPA, RN;Jessica Luan Pulling, MA, RCEP, CCRP, CCET;Amanda Sommer, BA, ACSM CEP, Exercise Physiologist    Virtual Visit No    Medication changes reported     No    Fall or balance concerns reported    No    Warm-up and Cool-down Performed on first and last piece of equipment    Resistance Training Performed Yes    VAD Patient? No    PAD/SET Patient? No      Pain Assessment   Currently in Pain? No/denies                Social History   Tobacco Use  Smoking Status Former   Packs/day: 1.50   Years: 40.00   Pack years: 60.00   Types: Cigarettes   Quit date: 09/14/2007   Years since quitting: 13.4  Smokeless Tobacco Never  Tobacco Comments   quit 2009    Goals Met:  Independence with exercise equipment Exercise tolerated well No report of concerns or symptoms today Strength training completed today  Goals Unmet:  Not Applicable  Comments: Pt able to follow exercise prescription today without complaint.  Will continue to monitor for progression.    Dr. Emily Filbert is Medical Director for [place].  Dr. Ottie Glazier is Medical Director for [facility] Pulmonary Rehabilitation.

## 2021-02-19 ENCOUNTER — Other Ambulatory Visit: Payer: Self-pay

## 2021-02-19 DIAGNOSIS — Z951 Presence of aortocoronary bypass graft: Secondary | ICD-10-CM

## 2021-02-19 DIAGNOSIS — Z87891 Personal history of nicotine dependence: Secondary | ICD-10-CM | POA: Diagnosis not present

## 2021-02-19 NOTE — Progress Notes (Signed)
Daily Session Note  Patient Details  Name: [name] MRN: [id number] Date of Birth: February 03, 1951 Referring Provider:   Flowsheet Row Cardiac Rehab from 12/31/2020 in [facility] Cardiac and Pulmonary Rehab  Referring Provider [name] MD       Encounter Date: 02/19/2021  Check In:  Session Check In - 02/19/21 0916       Check-In   Supervising physician immediately available to respond to emergencies See telemetry face sheet for immediately available ER MD    Location [facility]-Cardiac & Pulmonary Rehab    Staff Present Birdie Sons, MPA, RN;Jessica Luan Pulling, MA, RCEP, CCRP, CCET;Amanda Sommer, BA, ACSM CEP, Exercise Physiologist    Virtual Visit No    Medication changes reported     No    Fall or balance concerns reported    No    Warm-up and Cool-down Performed on first and last piece of equipment    Resistance Training Performed Yes    VAD Patient? No    PAD/SET Patient? No      Pain Assessment   Currently in Pain? No/denies                Social History   Tobacco Use  Smoking Status Former   Packs/day: 1.50   Years: 40.00   Pack years: 60.00   Types: Cigarettes   Quit date: 09/14/2007   Years since quitting: 13.4  Smokeless Tobacco Never  Tobacco Comments   quit 2009    Goals Met:  Independence with exercise equipment Exercise tolerated well No report of concerns or symptoms today Strength training completed today  Goals Unmet:  Not Applicable  Comments: Pt able to follow exercise prescription today without complaint.  Will continue to monitor for progression.    Dr. Emily Filbert is Medical Director for [place].  Dr. Ottie Glazier is Medical Director for [facility] Pulmonary Rehabilitation.

## 2021-02-21 ENCOUNTER — Other Ambulatory Visit: Payer: Self-pay

## 2021-02-21 DIAGNOSIS — Z951 Presence of aortocoronary bypass graft: Secondary | ICD-10-CM

## 2021-02-21 NOTE — Progress Notes (Signed)
Daily Session Note  Patient Details  Name: [name] MRN: [id number] Date of Birth: [birth year] Referring Provider:   Flowsheet Row Cardiac Rehab from 12/31/2020 in [facility] Cardiac and Pulmonary Rehab  Referring Provider [name] MD       Encounter Date: 02/21/2021  Check In:  Session Check In - 02/21/21 0930       Check-In   Supervising physician immediately available to respond to emergencies See telemetry face sheet for immediately available ER MD    Location [facility]-Cardiac & Pulmonary Rehab    Staff Present Birdie Sons, MPA, RN;Amanda Oletta Darter, BA, ACSM CEP, Exercise Physiologist;Melissa Caiola, RDN, LDN    Virtual Visit No    Medication changes reported     No    Fall or balance concerns reported    No    Warm-up and Cool-down Performed on first and last piece of equipment    Resistance Training Performed Yes    VAD Patient? No    PAD/SET Patient? No      Pain Assessment   Currently in Pain? No/denies                Social History   Tobacco Use  Smoking Status Former   Packs/day: 1.50   Years: 40.00   Pack years: 60.00   Types: Cigarettes   Quit date: 09/14/2007   Years since quitting: 13.4  Smokeless Tobacco Never  Tobacco Comments   quit 2009    Goals Met:  Independence with exercise equipment Exercise tolerated well No report of concerns or symptoms today Strength training completed today  Goals Unmet:  Not Applicable  Comments: Pt able to follow exercise prescription today without complaint.  Will continue to monitor for progression.    Dr. Emily Filbert is Medical Director for [place].  Dr. Ottie Glazier is Medical Director for [facility] Pulmonary Rehabilitation.

## 2021-02-26 ENCOUNTER — Other Ambulatory Visit: Payer: Self-pay

## 2021-02-26 DIAGNOSIS — Z951 Presence of aortocoronary bypass graft: Secondary | ICD-10-CM

## 2021-02-26 DIAGNOSIS — Z87891 Personal history of nicotine dependence: Secondary | ICD-10-CM | POA: Diagnosis not present

## 2021-02-26 NOTE — Progress Notes (Signed)
Daily Session Note  Patient Details  Name: [name] MRN: [id number] Date of Birth: 1950-12-07 Referring Provider:   Flowsheet Row Cardiac Rehab from 12/31/2020 in [facility] Cardiac and Pulmonary Rehab  Referring Provider [name] MD       Encounter Date: 02/26/2021  Check In:  Session Check In - 02/26/21 0938       Check-In   Supervising physician immediately available to respond to emergencies See telemetry face sheet for immediately available ER MD    Location [facility]-Cardiac & Pulmonary Rehab    Staff Present Birdie Sons, MPA, RN;Jessica Luan Pulling, MA, RCEP, CCRP, CCET;Amanda Sommer, BA, ACSM CEP, Exercise Physiologist    Virtual Visit No    Medication changes reported     No    Fall or balance concerns reported    No    Warm-up and Cool-down Performed on first and last piece of equipment    Resistance Training Performed Yes    VAD Patient? No    PAD/SET Patient? No      Pain Assessment   Currently in Pain? No/denies                Social History   Tobacco Use  Smoking Status Former   Packs/day: 1.50   Years: 40.00   Pack years: 60.00   Types: Cigarettes   Quit date: 09/14/2007   Years since quitting: 13.4  Smokeless Tobacco Never  Tobacco Comments   quit 2009    Goals Met:  Independence with exercise equipment Exercise tolerated well No report of concerns or symptoms today Strength training completed today  Goals Unmet:  Not Applicable  Comments: Pt able to follow exercise prescription today without complaint.  Will continue to monitor for progression.    Dr. Emily Filbert is Medical Director for [place].  Dr. Ottie Glazier is Medical Director for [facility] Pulmonary Rehabilitation.

## 2021-02-28 ENCOUNTER — Other Ambulatory Visit: Payer: Self-pay

## 2021-02-28 DIAGNOSIS — Z87891 Personal history of nicotine dependence: Secondary | ICD-10-CM | POA: Diagnosis not present

## 2021-02-28 DIAGNOSIS — Z951 Presence of aortocoronary bypass graft: Secondary | ICD-10-CM | POA: Diagnosis not present

## 2021-02-28 NOTE — Progress Notes (Signed)
Daily Session Note  Patient Details  Name: [name] MRN: [id number] Date of Birth: 12-30-1950 Referring Provider:   Flowsheet Row Cardiac Rehab from 12/31/2020 in [facility] Cardiac and Pulmonary Rehab  Referring Provider [name] MD       Encounter Date: 02/28/2021  Check In:  Session Check In - 02/28/21 1000       Check-In   Supervising physician immediately available to respond to emergencies See telemetry face sheet for immediately available ER MD    Location [facility]-Cardiac & Pulmonary Rehab    Staff Present Birdie Sons, MPA, RN;Jessica Kunkle, MA, RCEP, CCRP, Rosalio Macadamia, BS, ACSM CEP, Exercise Physiologist;Amanda Oletta Darter, [place], ACSM CEP, Exercise Physiologist    Virtual Visit No    Medication changes reported     No    Fall or balance concerns reported    No    Warm-up and Cool-down Performed on first and last piece of equipment    Resistance Training Performed Yes    VAD Patient? No    PAD/SET Patient? No      Pain Assessment   Currently in Pain? No/denies                Social History   Tobacco Use  Smoking Status Former   Packs/day: 1.50   Years: 40.00   Pack years: 60.00   Types: Cigarettes   Quit date: 09/14/2007   Years since quitting: 13.4  Smokeless Tobacco Never  Tobacco Comments   quit 2009    Goals Met:  Independence with exercise equipment Exercise tolerated well No report of concerns or symptoms today Strength training completed today  Goals Unmet:  Not Applicable  Comments: Pt able to follow exercise prescription today without complaint.  Will continue to monitor for progression.    Dr. Emily Filbert is Medical Director for [place].  Dr. Ottie Glazier is Medical Director for [facility] Pulmonary Rehabilitation.

## 2021-03-05 ENCOUNTER — Other Ambulatory Visit: Payer: Self-pay

## 2021-03-05 DIAGNOSIS — Z951 Presence of aortocoronary bypass graft: Secondary | ICD-10-CM

## 2021-03-05 DIAGNOSIS — Z87891 Personal history of nicotine dependence: Secondary | ICD-10-CM | POA: Diagnosis not present

## 2021-03-05 NOTE — Progress Notes (Signed)
Daily Session Note  Patient Details  Name: [name] MRN: [id number] Date of Birth: [birth year] Referring Provider:   Flowsheet Row Cardiac Rehab from 12/31/2020 in [facility] Cardiac and Pulmonary Rehab  Referring Provider [name] MD       Encounter Date: 03/05/2021  Check In:  Session Check In - 03/05/21 0948       Check-In   Supervising physician immediately available to respond to emergencies See telemetry face sheet for immediately available ER MD    Location [facility]-Cardiac & Pulmonary Rehab    Staff Present Birdie Sons, MPA, RN;Melissa [place], RDN, Rowe Pavy, BA, ACSM CEP, Exercise Physiologist    Virtual Visit No    Medication changes reported     No    Fall or balance concerns reported    No    Warm-up and Cool-down Performed on first and last piece of equipment    Resistance Training Performed Yes    VAD Patient? No    PAD/SET Patient? No      Pain Assessment   Currently in Pain? No/denies                Social History   Tobacco Use  Smoking Status Former   Packs/day: 1.50   Years: 40.00   Pack years: 60.00   Types: Cigarettes   Quit date: 09/14/2007   Years since quitting: 13.4  Smokeless Tobacco Never  Tobacco Comments   quit 2009    Goals Met:  Independence with exercise equipment Exercise tolerated well No report of concerns or symptoms today Strength training completed today  Goals Unmet:  Not Applicable  Comments: Pt able to follow exercise prescription today without complaint.  Will continue to monitor for progression.    Dr. Emily Filbert is Medical Director for [place].  Dr. Ottie Glazier is Medical Director for [facility] Pulmonary Rehabilitation.

## 2021-03-07 ENCOUNTER — Other Ambulatory Visit: Payer: Self-pay

## 2021-03-07 VITALS — Ht 68.6 in | Wt 183.0 lb

## 2021-03-07 DIAGNOSIS — Z87891 Personal history of nicotine dependence: Secondary | ICD-10-CM | POA: Diagnosis not present

## 2021-03-07 DIAGNOSIS — Z951 Presence of aortocoronary bypass graft: Secondary | ICD-10-CM

## 2021-03-07 NOTE — Progress Notes (Signed)
Daily Session Note  Patient Details  Name: [name] MRN: [id number] Date of Birth: 21-Aug-1950 Referring Provider:   Flowsheet Row Cardiac Rehab from 12/31/2020 in [facility] Cardiac and Pulmonary Rehab  Referring Provider [name] MD       Encounter Date: 03/07/2021  Check In:  Session Check In - 03/07/21 0932       Check-In   Supervising physician immediately available to respond to emergencies See telemetry face sheet for immediately available ER MD    Location [facility]-Cardiac & Pulmonary Rehab    Staff Present Birdie Sons, MPA, Mauricia Area, BS, ACSM CEP, Exercise Physiologist;Joseph Tessie Fass, [place]    Virtual Visit No    Medication changes reported     No    Fall or balance concerns reported    No    Warm-up and Cool-down Performed on first and last piece of equipment    Resistance Training Performed Yes    VAD Patient? No    PAD/SET Patient? No      Pain Assessment   Currently in Pain? No/denies                Social History   Tobacco Use  Smoking Status Former   Packs/day: 1.50   Years: 40.00   Pack years: 60.00   Types: Cigarettes   Quit date: 09/14/2007   Years since quitting: 13.4  Smokeless Tobacco Never  Tobacco Comments   quit 2009    Goals Met:  Independence with exercise equipment Exercise tolerated well No report of concerns or symptoms today Strength training completed today  Goals Unmet:  Not Applicable  Comments: Pt able to follow exercise prescription today without complaint.  Will continue to monitor for progression.   [place] Name 12/31/20 1154 03/07/21 0934       6 Minute Walk   Phase Initial Discharge    Distance 1445 feet 1805 feet    Distance % Change -- 24.9 %    Distance Feet Change -- 360 ft    Walk Time 6 minutes 6 minutes    # of Rest Breaks 0 0    MPH 2.74 3.42    METS 3.33 4.26    RPE 9 14    Perceived Dyspnea  0 0    VO2 Peak 11.67 14.89    Symptoms Yes (comment) No     Comments chronic left hip pain 2/10 --    Resting HR 83 bpm 89 bpm    Resting BP 126/74 124/70    Resting Oxygen Saturation  97 % 98 %    Exercise Oxygen Saturation  during 6 min walk 98 % 98 %    Max Ex. HR 107 bpm 138 bpm    Max Ex. BP 142/70 138/70    2 Minute Post BP 122/66 --               Dr. Emily Filbert is Medical Director for [place].  Dr. Ottie Glazier is Medical Director for [facility] Pulmonary Rehabilitation.
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
## 2021-03-11 ENCOUNTER — Other Ambulatory Visit: Payer: Self-pay

## 2021-03-11 ENCOUNTER — Encounter: Payer: Self-pay | Admitting: Cardiovascular Disease

## 2021-03-11 ENCOUNTER — Ambulatory Visit (INDEPENDENT_AMBULATORY_CARE_PROVIDER_SITE_OTHER): Payer: Medicare Other | Admitting: Cardiovascular Disease

## 2021-03-11 VITALS — BP 122/70 | HR 79 | Ht 68.0 in | Wt 183.1 lb

## 2021-03-11 DIAGNOSIS — I25118 Atherosclerotic heart disease of native coronary artery with other forms of angina pectoris: Secondary | ICD-10-CM

## 2021-03-11 DIAGNOSIS — Z951 Presence of aortocoronary bypass graft: Secondary | ICD-10-CM

## 2021-03-11 DIAGNOSIS — Z79899 Other long term (current) drug therapy: Secondary | ICD-10-CM

## 2021-03-11 DIAGNOSIS — I200 Unstable angina: Secondary | ICD-10-CM | POA: Diagnosis not present

## 2021-03-11 DIAGNOSIS — I25119 Atherosclerotic heart disease of native coronary artery with unspecified angina pectoris: Secondary | ICD-10-CM | POA: Diagnosis not present

## 2021-03-11 DIAGNOSIS — E7800 Pure hypercholesterolemia, unspecified: Secondary | ICD-10-CM

## 2021-03-11 DIAGNOSIS — I493 Ventricular premature depolarization: Secondary | ICD-10-CM

## 2021-03-11 DIAGNOSIS — G4733 Obstructive sleep apnea (adult) (pediatric): Secondary | ICD-10-CM

## 2021-03-11 DIAGNOSIS — I10 Essential (primary) hypertension: Secondary | ICD-10-CM

## 2021-03-11 DIAGNOSIS — E785 Hyperlipidemia, unspecified: Secondary | ICD-10-CM

## 2021-03-11 DIAGNOSIS — I255 Ischemic cardiomyopathy: Secondary | ICD-10-CM | POA: Diagnosis not present

## 2021-03-11 MED ORDER — POTASSIUM CHLORIDE CRYS ER 20 MEQ PO TBCR: 20.0000 meq | EXTENDED_RELEASE_TABLET | Freq: Every day | ORAL | 3 refills | Status: DC

## 2021-03-11 MED ORDER — CARVEDILOL 6.25 MG PO TABS: 6.2500 mg | ORAL_TABLET | Freq: Two times a day (BID) | ORAL | 3 refills | Status: DC

## 2021-03-11 MED ORDER — FUROSEMIDE 40 MG PO TABS: 40.0000 mg | ORAL_TABLET | Freq: Every day | ORAL | 3 refills | Status: DC

## 2021-03-11 MED ORDER — ATORVASTATIN CALCIUM 80 MG PO TABS: 80.0000 mg | ORAL_TABLET | Freq: Every day | ORAL | 3 refills | Status: DC

## 2021-03-11 MED ORDER — ISOSORBIDE MONONITRATE ER 30 MG PO TB24: 30.0000 mg | ORAL_TABLET | Freq: Every day | ORAL | 3 refills | Status: DC

## 2021-03-11 MED ORDER — LOSARTAN POTASSIUM 25 MG PO TABS: 25.0000 mg | ORAL_TABLET | Freq: Every day | ORAL | 3 refills | Status: DC

## 2021-03-11 NOTE — Patient Instructions (Addendum)
Medication Instructions:  Please decrease  potassium down to one a day  Please START losartan 25 mg daily   If you need a refill on your cardiac medications before your next appointment, please call your pharmacy.   Lab work: BMP and lipids in 1 month  Testing/Procedures: ECHO Your physician has requested that you have an echocardiogram. Echocardiography is a painless test that uses sound waves to create images of your heart. It provides your doctor with information about the size and shape of your heart and how well your heart's chambers and valves are working. This procedure takes approximately one hour. There are no restrictions for this procedure.  There is a possibility that an IV may need to be started during your test to inject an image enhancing agent. This is done to obtain more optimal pictures of your heart. Therefore we ask that you do at least drink some water prior to coming in to hydrate your veins.    Follow-Up: At [facility], you and your health needs are our priority.  As part of our continuing mission to provide you with exceptional heart care, we have created designated Provider Care Teams.  These Care Teams include your primary Cardiologist (physician) and Advanced Practice Providers (APPs -  Physician Assistants and Nurse Practitioners) who all work together to provide you with the care you need, when you need it.  You will need a follow up appointment in 12 months  Providers on your designated Care Team:   Murray Hodgkins, NP Christell Faith, PA-C Cadence Kathlen Mody, [place]  COVID-19 Vaccine Information can be found at: [email or web address].uk For questions related to vaccine distribution or appointments, please email vaccine@[email or web address] or call [phone].

## 2021-03-12 ENCOUNTER — Encounter: Payer: Medicare Other | Attending: Cardiovascular Disease

## 2021-03-12 DIAGNOSIS — Z951 Presence of aortocoronary bypass graft: Secondary | ICD-10-CM | POA: Diagnosis not present

## 2021-03-12 NOTE — Progress Notes (Signed)
Daily Session Note  Patient Details  Name: [name] MRN: [id number] Date of Birth: [birth year] Referring Provider:   Flowsheet Row Cardiac Rehab from 12/31/2020 in [facility] Cardiac and Pulmonary Rehab  Referring Provider [name] MD       Encounter Date: 03/12/2021  Check In:  Session Check In - 03/12/21 0929       Check-In   Supervising physician immediately available to respond to emergencies See telemetry face sheet for immediately available ER MD    Location [facility]-Cardiac & Pulmonary Rehab    Staff Present Birdie Sons, MPA, RN;Jessica Luan Pulling, MA, RCEP, CCRP, CCET;Amanda Sommer, BA, ACSM CEP, Exercise Physiologist    Virtual Visit No    Medication changes reported     No    Fall or balance concerns reported    No    Warm-up and Cool-down Performed on first and last piece of equipment    Resistance Training Performed Yes    VAD Patient? No    PAD/SET Patient? No      Pain Assessment   Currently in Pain? No/denies                Social History   Tobacco Use  Smoking Status Former   Packs/day: 1.50   Years: 40.00   Pack years: 60.00   Types: Cigarettes   Quit date: 09/14/2007   Years since quitting: 13.5  Smokeless Tobacco Never  Tobacco Comments   quit 2009    Goals Met:  Independence with exercise equipment Exercise tolerated well No report of concerns or symptoms today Strength training completed today  Goals Unmet:  Not Applicable  Comments: Pt able to follow exercise prescription today without complaint.  Will continue to monitor for progression.    Dr. Emily Filbert is Medical Director for [place].  Dr. Ottie Glazier is Medical Director for [facility] Pulmonary Rehabilitation.

## 2021-03-13 ENCOUNTER — Encounter: Payer: Self-pay | Admitting: *Deleted

## 2021-03-13 DIAGNOSIS — Z951 Presence of aortocoronary bypass graft: Secondary | ICD-10-CM

## 2021-03-13 NOTE — Progress Notes (Signed)
Cardiac Individual Treatment Plan  Patient Details  Name: [name] MRN: [id number] Date of Birth: 04-Oct-1950 Referring Provider:   Flowsheet Row Cardiac Rehab from 12/31/2020 in [facility] Cardiac and Pulmonary Rehab  Referring Provider [name] MD       Initial Encounter Date:  Flowsheet Row Cardiac Rehab from 12/31/2020 in [facility] Cardiac and Pulmonary Rehab  Date 12/31/20       Visit Diagnosis: S/P CABG x 5  Patient's Home Medications on Admission:  Current Outpatient Medications:    aspirin EC 325 MG EC tablet, Take 1 tablet (325 mg total) by mouth daily., Disp: 30 tablet, Rfl: 0   atorvastatin (LIPITOR) 80 MG tablet, Take 1 tablet (80 mg total) by mouth daily., Disp: 90 tablet, Rfl: 3   carvedilol (COREG) 6.25 MG tablet, Take 1 tablet (6.25 mg total) by mouth 2 (two) times daily with a meal., Disp: 180 tablet, Rfl: 3   diphenhydramine-acetaminophen (TYLENOL PM) 25-500 MG TABS tablet, Take 2 tablets by mouth at bedtime., Disp: , Rfl:    Dulaglutide (TRULICITY) 9.97 FS/1.4EL SOPN, Inject 0.75 mg into the skin once a week. (Patient not taking: Reported on 03/11/2021), Disp: 6 mL, Rfl: 1   ezetimibe (ZETIA) 10 MG tablet, Take 1 tablet (10 mg total) by mouth daily., Disp: 90 tablet, Rfl: 3   furosemide (LASIX) 40 MG tablet, Take 1 tablet (40 mg total) by mouth daily., Disp: 90 tablet, Rfl: 3   isosorbide mononitrate (IMDUR) 30 MG 24 hr tablet, Take 1 tablet (30 mg total) by mouth daily., Disp: 90 tablet, Rfl: 3   JARDIANCE 25 MG TABS tablet, TAKE 1 TABLET DAILY, Disp: 90 tablet, Rfl: 3   losartan (COZAAR) 25 MG tablet, Take 1 tablet (25 mg total) by mouth daily., Disp: 90 tablet, Rfl: 3   metFORMIN (GLUCOPHAGE) 1000 MG tablet, TAKE 1 TABLET TWICE A DAY WITH A MEAL, Disp: 180 tablet, Rfl: 3   Multiple Vitamins-Minerals (MULTIVITAMINS THER. W/MINERALS) TABS, Take 1 tablet by mouth every morning., Disp: , Rfl:    niacin (NIASPAN) 1000 MG CR tablet, TAKE 2 TABLETS DAILY AT  BEDTIME, Disp: 180 tablet, Rfl: 2   potassium chloride SA (KLOR-CON) 20 MEQ tablet, Take 1 tablet (20 mEq total) by mouth daily., Disp: 90 tablet, Rfl: 3  Past Medical History: Past Medical History:  Diagnosis Date   Angina    Asthma    BCC (basal cell carcinoma of skin) 09/06/2020   left upper back lateral (EDC) ,  left upper arm anterior (EDC), left upper back medial (EDC)    Bursitis of left elbow 2018   Diabetes mellitus without complication ([place])    ED (erectile dysfunction)    HTN (hypertension) 04/12/2011   Hyperlipemia 04/12/2011   Ischemic cardiomyopathy    a. echo 2010: EF 45-50%, mild to mod ant and apical wall HK, trace MR; b. cardiac cath 06/2012: EF 40%, mild MR    Multiple vessel coronary artery disease 04/12/2011   a. remote MI 1996; b. MI 1997 s/p PCI - LAD & diag; c. MI 2009: long stenting P-MLAD & Diag & POBA of jailed diag branch; d. cath 2010: 80% ISR of LAD w/ L-R collats, med Rx; e. cath 2012: 50-60% tub mLAD, 60-70% dLAD, FFR 0.75 -->s/p PCI dissection => w/ 2 stents (3 total); f. cath 06/2012: no changes from 2012 cath, med Rx, patent stents -> MV CAD 11/20/20 --> CABG X 5   Myocardial infarction (HCC)    x 5   Obesity  OSA (obstructive sleep apnea)    on CPAP   Osteoarthritis    Renal disorder    kidney stone    Tobacco Use: Social History   Tobacco Use  Smoking Status Former   Packs/day: 1.50   Years: 40.00   Pack years: 60.00   Types: Cigarettes   Quit date: 09/14/2007   Years since quitting: 13.5  Smokeless Tobacco Never  Tobacco Comments   quit 2009    Labs: Recent Review Flowsheet Data     Labs for ITP Cardiac and Pulmonary Rehab Latest Ref Rng & Units 11/20/2020 11/20/2020 11/20/2020 11/20/2020 11/20/2020   Cholestrol 0 - 200 mg/dL - - - - -   LDLCALC 0 - 99 mg/dL - - - - -   LDLDIRECT mg/dL - - - - -   HDL >40 mg/dL - - - - -   Trlycerides <150 mg/dL - - - - -   Hemoglobin A1c 4.8 - 5.6 % - - - - -   PHART 7.350 - 7.450 - 7.331(L)  7.335(L) 7.319(L) 7.343(L)   PCO2ART 32.0 - 48.0 mmHg - 42.9 40.0 31.8(L) 40.1   HCO3 20.0 - 28.0 mmol/L - 23.0 21.3 16.4(L) 21.8   TCO2 22 - 32 mmol/L _0 17(L) 23   ACIDBASEDEF 0.0 - 2.0 mmol/L - 3.0(H) 4.0(H) 9.0(H) 4.0(H)   O2SAT % - 96.0 99.0 99.0 95.0        Exercise Target Goals: Exercise Program Goal: Individual exercise prescription set using results from initial 6 min walk test and THRR while considering  patient's activity barriers and safety.   Exercise Prescription Goal: Initial exercise prescription builds to 30-45 minutes a day of aerobic activity, 2-3 days per week.  Home exercise guidelines will be given to patient during program as part of exercise prescription that the participant will acknowledge.   Education: Aerobic Exercise: - Group verbal and visual presentation on the components of exercise prescription. Introduces F.I.T.T principle from ACSM for exercise prescriptions.  Reviews F.I.T.T. principles of aerobic exercise including progression. Written material given at graduation. Flowsheet Row Cardiac Rehab from 02/28/2021 in [facility] Cardiac and Pulmonary Rehab  Date 01/03/21  Educator [facility]  Instruction Review Code 1- Verbalizes Understanding       Education: Resistance Exercise: - Group verbal and visual presentation on the components of exercise prescription. Introduces F.I.T.T principle from ACSM for exercise prescriptions  Reviews F.I.T.T. principles of resistance exercise including progression. Written material given at graduation. Flowsheet Row Cardiac Rehab from 02/28/2021 in [facility] Cardiac and Pulmonary Rehab  Date 01/10/21  Educator [employer]  Instruction Review Code 1- Verbalizes Understanding        Education: Exercise & Equipment Safety: - Individual verbal instruction and demonstration of equipment use and safety with use of the equipment. Flowsheet Row Cardiac Rehab from 02/28/2021 in [facility] Cardiac and Pulmonary Rehab  Date 12/26/20  Educator [facility]   Instruction Review Code 1- Verbalizes Understanding       Education: Exercise Physiology & General Exercise Guidelines: - Group verbal and written instruction with models to review the exercise physiology of the cardiovascular system and associated critical values. Provides general exercise guidelines with specific guidelines to those with heart or lung disease.  Flowsheet Row Cardiac Rehab from 02/28/2021 in [facility] Cardiac and Pulmonary Rehab  Date 02/28/21  Educator [facility]  Instruction Review Code 1- Verbalizes Understanding       Education: Flexibility, Balance, Mind/Body Relaxation: - Group verbal and visual presentation with interactive activity on the components  of exercise prescription. Introduces F.I.T.T principle from ACSM for exercise prescriptions. Reviews F.I.T.T. principles of flexibility and balance exercise training including progression. Also discusses the mind body connection.  Reviews various relaxation techniques to help reduce and manage stress (i.e. Deep breathing, progressive muscle relaxation, and visualization). Balance handout provided to take home. Written material given at graduation. Flowsheet Row Cardiac Rehab from 02/28/2021 in [facility] Cardiac and Pulmonary Rehab  Date 01/17/21  Educator [facility]  Instruction Review Code 1- Verbalizes Understanding       Activity Barriers & Risk Stratification:  Activity Barriers & Cardiac Risk Stratification - 12/31/20 1155       Activity Barriers & Cardiac Risk Stratification   Activity Barriers Deconditioning;Muscular Weakness;Joint Problems;History of Falls;Other (comment);Incisional Pain    Comments occasional left hip pain, vein grafts still painful at times    Cardiac Risk Stratification High             6 Minute Walk:  6 Minute Walk     Row Name 12/31/20 1154 03/07/21 0934       6 Minute Walk   Phase Initial Discharge    Distance 1445 feet 1805 feet    Distance % Change -- 24.9 %    Distance Feet Change --  360 ft    Walk Time 6 minutes 6 minutes    # of Rest Breaks 0 0    MPH 2.74 3.42    METS 3.33 4.26    RPE 9 14    Perceived Dyspnea  0 0    VO2 Peak 11.67 14.89    Symptoms Yes (comment) No    Comments chronic left hip pain 2/10 --    Resting HR 83 bpm 89 bpm    Resting BP 126/74 124/70    Resting Oxygen Saturation  97 % 98 %    Exercise Oxygen Saturation  during 6 min walk 98 % 98 %    Max Ex. HR 107 bpm 138 bpm    Max Ex. BP 142/70 138/70    2 Minute Post BP 122/66 --             Oxygen Initial Assessment:   Oxygen Re-Evaluation:   Oxygen Discharge (Final Oxygen Re-Evaluation):   Initial Exercise Prescription:  Initial Exercise Prescription - 12/31/20 1100       Date of Initial Exercise RX and Referring Provider   Date 12/31/20    Referring Provider [name] MD      Treadmill   MPH 2.7    Grade 1    Minutes 15    METs 3.44      NuStep   Level 3    SPM 80    Minutes 15    METs 3.3      Elliptical   Level 1    Speed 3    Minutes 15    METs 3      Prescription Details   Frequency (times per week) 2    Duration Progress to 30 minutes of continuous aerobic without signs/symptoms of physical distress      Intensity   THRR 40-80% of Max Heartrate 110-137    Ratings of Perceived Exertion 11-13    Perceived Dyspnea 0-4      Progression   Progression Continue to progress workloads to maintain intensity without signs/symptoms of physical distress.      Resistance Training   Training Prescription Yes    Weight 4 lb    Reps 10-15  Perform Capillary Blood Glucose checks as needed.  Exercise Prescription Changes:   Exercise Prescription Changes     Row Name 12/31/20 1100 01/07/21 1200 01/10/21 0900 01/23/21 1100 02/04/21 1000     Response to Exercise   Blood Pressure (Admit) 126/74 122/70 -- 124/64 114/56   Blood Pressure (Exercise) 142/70 132/72 -- 146/68 --   Blood Pressure (Exit) 122/66 126/64 -- 118/64 114/64    Heart Rate (Admit) 83 bpm 94 bpm -- 78 bpm 89 bpm   Heart Rate (Exercise) 107 bpm 122 bpm -- 124 bpm 127 bpm   Heart Rate (Exit) 68 bpm 104 bpm -- 90 bpm 106 bpm   Oxygen Saturation (Admit) 97 % -- -- -- --   Oxygen Saturation (Exercise) 98 % -- -- -- --   Rating of Perceived Exertion (Exercise) 9 12 -- 15 13   Perceived Dyspnea (Exercise) 0 -- -- -- --   Symptoms left hip pain 2/10 none -- none none   Comments walk test results second full day of exercise -- -- --   Duration -- Progress to 30 minutes of  aerobic without signs/symptoms of physical distress -- Continue with 30 min of aerobic exercise without signs/symptoms of physical distress. Continue with 30 min of aerobic exercise without signs/symptoms of physical distress.   Intensity -- THRR unchanged -- THRR unchanged THRR unchanged     Progression   Progression -- Continue to progress workloads to maintain intensity without signs/symptoms of physical distress. -- Continue to progress workloads to maintain intensity without signs/symptoms of physical distress. Continue to progress workloads to maintain intensity without signs/symptoms of physical distress.   Average METs -- 2.97 -- 4.6 3.83     Resistance Training   Training Prescription -- Yes -- Yes Yes   Weight -- 4 lb -- 4 lb 4 lb   Reps -- 10-15 -- 10-15 10-15     Interval Training   Interval Training -- No -- No No     Treadmill   MPH -- 3 -- 3.4 3.5   Grade -- 1.5 -- 1.5 1.5   Minutes -- 15 -- 15 15   METs -- 3.92 -- 4.31 4.4     NuStep   Level -- 3 -- 4 4   Minutes -- 15 -- 15 15   METs -- 2.9 -- 4.9 4.8     Recumbant Elliptical   Level -- -- -- -- 3.2   Minutes -- -- -- -- 15   METs -- -- -- -- 2.1     Elliptical   Level -- 1 -- -- --   Minutes -- 15 -- -- --   METs -- 2.1 -- -- --     Home Exercise Plan   Plans to continue exercise at -- -- [employer] (comment)  [employer] (comment)  [employer]  (comment)  [employer]   Frequency -- -- Add 3 additional days to program exercise sessions. Add 3 additional days to program exercise sessions. Add 3 additional days to program exercise sessions.   Initial Home Exercises Provided -- -- 01/10/21 01/10/21 01/10/21    Row Name 02/19/21 1400 03/04/21 1500           Response to Exercise   Blood Pressure (Admit) 108/60 132/82      Blood Pressure (Exit) 108/68 110/62      Heart Rate (Admit) 89 bpm 107 bpm      Heart Rate (Exercise) 114 bpm 145 bpm  Heart Rate (Exit) 100 bpm 110 bpm      Oxygen Saturation (Admit) -- 96 %      Oxygen Saturation (Exercise) -- 94 %      Oxygen Saturation (Exit) -- 94 %      Rating of Perceived Exertion (Exercise) 15 13      Perceived Dyspnea (Exercise) -- 1      Symptoms none none      Duration Continue with 30 min of aerobic exercise without signs/symptoms of physical distress. Continue with 30 min of aerobic exercise without signs/symptoms of physical distress.      Intensity THRR unchanged THRR unchanged        Progression   Progression Continue to progress workloads to maintain intensity without signs/symptoms of physical distress. Continue to progress workloads to maintain intensity without signs/symptoms of physical distress.      Average METs 8.7 8.53        Resistance Training   Training Prescription Yes Yes      Weight 4 lb 4 lb      Reps 10-15 10-15        Interval Training   Interval Training No No        Treadmill   MPH 3.7 5.6      Grade 2.5 2      Minutes 15 15      METs 5.11 11.5        NuStep   Level 5 5      Minutes 15 115      METs 12.3 8.7        Recumbant Elliptical   Level -- 3      Minutes -- 15      METs -- 5.4        Home Exercise Plan   Plans to continue exercise at [employer] (comment)  [employer] (comment)  [employer]      Frequency Add 3 additional days to program exercise sessions. Add 3 additional days to program  exercise sessions.      Initial Home Exercises Provided 01/10/21 01/10/21        Oxygen   Maintain Oxygen Saturation -- 88% or higher               Exercise Comments:   Exercise Comments     Row Name 01/01/21 1607           Exercise Comments First full day of exercise!  Patient was oriented to gym and equipment including functions, settings, policies, and procedures.  Patient's individual exercise prescription and treatment plan were reviewed.  All starting workloads were established based on the results of the 6 minute walk test done at initial orientation visit.  The plan for exercise progression was also introduced and progression will be customized based on patient's performance and goals.                Exercise Goals and Review:   Exercise Goals     Row Name 12/31/20 1158             Exercise Goals   Increase Physical Activity Yes       Intervention Provide advice, education, support and counseling about physical activity/exercise needs.;Develop an individualized exercise prescription for aerobic and resistive training based on initial evaluation findings, risk stratification, comorbidities and participant's personal goals.       Expected Outcomes Short Term: Attend rehab on a regular basis to increase amount of physical activity.;Long Term:  Add in home exercise to make exercise part of routine and to increase amount of physical activity.;Long Term: Exercising regularly at least 3-5 days a week.       Increase Strength and Stamina Yes       Intervention Provide advice, education, support and counseling about physical activity/exercise needs.;Develop an individualized exercise prescription for aerobic and resistive training based on initial evaluation findings, risk stratification, comorbidities and participant's personal goals.       Expected Outcomes Short Term: Increase workloads from initial exercise prescription for resistance, speed, and METs.;Short Term:  Perform resistance training exercises routinely during rehab and add in resistance training at home;Long Term: Improve cardiorespiratory fitness, muscular endurance and strength as measured by increased METs and functional capacity (6MWT)       Able to understand and use rate of perceived exertion (RPE) scale Yes       Intervention Provide education and explanation on how to use RPE scale       Expected Outcomes Short Term: Able to use RPE daily in rehab to express subjective intensity level;Long Term:  Able to use RPE to guide intensity level when exercising independently       Able to understand and use Dyspnea scale Yes       Intervention Provide education and explanation on how to use Dyspnea scale       Expected Outcomes Short Term: Able to use Dyspnea scale daily in rehab to express subjective sense of shortness of breath during exertion;Long Term: Able to use Dyspnea scale to guide intensity level when exercising independently       Knowledge and understanding of Target Heart Rate Range (THRR) Yes       Intervention Provide education and explanation of THRR including how the numbers were predicted and where they are located for reference       Expected Outcomes Short Term: Able to state/look up THRR;Short Term: Able to use daily as guideline for intensity in rehab;Long Term: Able to use THRR to govern intensity when exercising independently       Able to check pulse independently Yes       Intervention Provide education and demonstration on how to check pulse in carotid and radial arteries.;Review the importance of being able to check your own pulse for safety during independent exercise       Expected Outcomes Short Term: Able to explain why pulse checking is important during independent exercise;Long Term: Able to check pulse independently and accurately       Understanding of Exercise Prescription Yes       Intervention Provide education, explanation, and written materials on patient's  individual exercise prescription       Expected Outcomes Short Term: Able to explain program exercise prescription;Long Term: Able to explain home exercise prescription to exercise independently                Exercise Goals Re-Evaluation :  Exercise Goals Re-Evaluation     Row Name 01/01/21 5638 01/07/21 1208 01/10/21 0956 01/22/21 0936 02/04/21 1105     Exercise Goal Re-Evaluation   Exercise Goals Review Increase Physical Activity;Able to understand and use rate of perceived exertion (RPE) scale;Knowledge and understanding of Target Heart Rate Range (THRR);Understanding of Exercise Prescription;Able to understand and use Dyspnea scale;Able to check pulse independently;Increase Strength and Stamina Increase Physical Activity;Increase Strength and Stamina Increase Physical Activity;Increase Strength and Stamina;Understanding of Exercise Prescription Increase Physical Activity;Increase Strength and Stamina Increase Physical Activity;Increase Strength and Stamina  Comments Reviewed RPE and dyspnea scales, THR and program prescription with pt today.  Pt voiced understanding and was given a copy of goals to take home. [name] is doing well the first couple sessions that he has been here. He has already increased his treadmill speed and incline to 3/ 1.5%. We will continue to increase his workloads as he progresses throughout the program. Will continue to monitor. Reviewed home exercise with pt today.  Pt plans to walk and go to [employer] for exercise.  He is also planning to do some weights on his own at home too. Reviewed THR, pulse, RPE, sign and symptoms, pulse oximetery and when to call 911 or MD.  Also discussed weather considerations and indoor options.  Pt voiced understanding. [name] has started doing aerobic and balance exercises last week. He is not checking HR  outside sessions. He wants to be able to ride his motorcycle here.  His Dr said it may take 3 months to get back on his bike and it  has been over 2.  He wants to try riding soon.  This staff recommended starting with short rides first. [name] is doing well in rehab. He increased his loads on the treadmill to 3.5 speed and 1.5% incline. Will continue to monitor his progression.   Expected Outcomes Short: Use RPE daily to regulate intensity. Long: Follow program prescription in THR. Short: Increase workloads as tolerated Long: Continue to increase overall MET level Short: Return to [employer] on off days Long: Continue to improve stamina Short: continue to work on balance and check HR while exercising Long: get back to riding motorcycle Short: Continue building up tolerance on treadmill Long: Continue to increase overall MET level    Row Name 02/19/21 1451 02/26/21 0932 03/04/21 1541         Exercise Goal Re-Evaluation   Exercise Goals Review Increase Physical Activity;Increase Strength and Stamina Increase Physical Activity;Increase Strength and Stamina;Understanding of Exercise Prescription Increase Physical Activity;Increase Strength and Stamina;Understanding of Exercise Prescription     Comments [name] continues to progress well and has added interval training, which has increased his average MET level.  We will continue to monitor progress. [name] is doing well in rehab.  He is walking and using 8 lb weights at home on his off days.  He is planning to join [employer] with his wife to keep it up.  He will usually go between 2.6-2.9 miles and then plans to increase from there.  He aims for an hour of walking.  His strength and stamina have improved. [name] continues to do well in rehab.  He is doing intervals on both treadmill and NuStep.  He has worked his way up to 5.6 mph. We will continue to monitor his progress.     Expected Outcomes Short: continue interval training  Long: continue to improve MET level Short: Continue to walk on off days Long: Continue to improve stamina. Short: Continue to use intervals Long:  Continue to improve stamina              Discharge Exercise Prescription (Final Exercise Prescription Changes):  Exercise Prescription Changes - 03/04/21 1500       Response to Exercise   Blood Pressure (Admit) 132/82    Blood Pressure (Exit) 110/62    Heart Rate (Admit) 107 bpm    Heart Rate (Exercise) 145 bpm    Heart Rate (Exit) 110 bpm    Oxygen Saturation (Admit) 96 %    Oxygen  Saturation (Exercise) 94 %    Oxygen Saturation (Exit) 94 %    Rating of Perceived Exertion (Exercise) 13    Perceived Dyspnea (Exercise) 1    Symptoms none    Duration Continue with 30 min of aerobic exercise without signs/symptoms of physical distress.    Intensity THRR unchanged      Progression   Progression Continue to progress workloads to maintain intensity without signs/symptoms of physical distress.    Average METs 8.53      Resistance Training   Training Prescription Yes    Weight 4 lb    Reps 10-15      Interval Training   Interval Training No      Treadmill   MPH 5.6    Grade 2    Minutes 15    METs 11.5      NuStep   Level 5    Minutes 115    METs 8.7      Recumbant Elliptical   Level 3    Minutes 15    METs 5.4      Home Exercise Plan   Plans to continue exercise at [employer] (comment)   [employer]   Frequency Add 3 additional days to program exercise sessions.    Initial Home Exercises Provided 01/10/21      Oxygen   Maintain Oxygen Saturation 88% or higher             Nutrition:  Target Goals: Understanding of nutrition guidelines, daily intake of sodium <1538m, cholesterol <2039m calories 30% from fat and 7% or less from saturated fats, daily to have 5 or more servings of fruits and vegetables.  Education: All About Nutrition: -Group instruction provided by verbal, written material, interactive activities, discussions, models, and posters to present general guidelines for heart healthy nutrition including fat, fiber, MyPlate, the role  of sodium in heart healthy nutrition, utilization of the nutrition label, and utilization of this knowledge for meal planning. Follow up email sent as well. Written material given at graduation. [facility]  Education need identified 12/31/20  Date 01/31/21  Educator MCRancho AlegreInstruction Review Code 1- Verbalizes Understanding       Biometrics:  Pre Biometrics - 12/31/20 1158       Pre Biometrics   Height 5' 8.6" (1.742 m)    Weight 184 lb (83.5 kg)    BMI (Calculated) 27.5    Single Leg Stand 17.5 seconds             Post Biometrics - 03/07/21 0935        Post  Biometrics   Height 5' 8.6" (1.742 m)    Weight 183 lb (83 kg)    BMI (Calculated) 27.35    Single Leg Stand 26.2 seconds             Nutrition Therapy Plan and Nutrition Goals:  Nutrition Therapy & Goals - 01/07/21 0800       Nutrition Therapy   Diet Heart healthy, low Na, diabetes friendly    Drug/Food Interactions Statins/Certain Fruits    Protein (specify units) 65g    Fiber 30 grams    Whole Grain Foods 3 servings    Saturated Fats 12 max. grams    Fruits and Vegetables 8 servings/day    Sodium 1.5 grams      Personal Nutrition Goals   Nutrition Goal ST: Continue with ST goal made to reduce added  sugar, practice CHO counting, pair CHO foods with protien and fat - ex: fruit with nuts LT: manage carbohydrates (choose complex CHO most often, 2-3 CHO for meals, 1-2 CHO for snacks, pair CHO with protein and/or fat)    Comments He has seen a nutritionist previously and he has been slowly incorporating her tips. PYP score 72.  T2DM, no insulin. He feels he eats too mnay carbs, but he is monitoring sugar - he is not eating so many sweets. He would then like to start working on his carbs. Varied diet. B: crsipy oat squares with strawberries and blueberries and banana and 2% milk L: can have leftovers - his wife likes to eat out more than him:  1-2x/week goes to [employer] to eat (does not want to change this). D: cooks: tonight flounder and hushpuppy mix. He prefers fresh or frozen vegetables with his meals. S: red pears and other fruit during the day Drinks: water and sometimes cheerwine Reviewed heart healthy eating and diabetes friendly eating.      Intervention Plan   Intervention Prescribe, educate and counsel regarding individualized specific dietary modifications aiming towards targeted core components such as weight, hypertension, lipid management, diabetes, heart failure and other comorbidities.;Nutrition handout(s) given to patient.    Expected Outcomes Short Term Goal: Understand basic principles of dietary content, such as calories, fat, sodium, cholesterol and nutrients.;Short Term Goal: A plan has been developed with personal nutrition goals set during dietitian appointment.;Long Term Goal: Adherence to prescribed nutrition plan.             Nutrition Assessments:  MEDIFICTS Score Key: ?70 Need to make dietary changes  40-70 Heart Healthy Diet ? 40 Therapeutic Level Cholesterol Diet  Flowsheet Row Cardiac Rehab from 03/12/2021 in [facility] Cardiac and Pulmonary Rehab  Picture Your Plate Total Score on Admission 72  Picture Your Plate Total Score on Discharge 67      Picture Your Plate Scores: <00 Unhealthy dietary pattern with much room for improvement. 41-50 Dietary pattern unlikely to meet recommendations for good health and room for improvement. 51-60 More healthful dietary pattern, with some room for improvement.  >60 Healthy dietary pattern, although there may be some specific behaviors that could be improved.    Nutrition Goals Re-Evaluation:  Nutrition Goals Re-Evaluation     [name] Name 01/22/21 0940 02/26/21 0938           Goals   Nutrition Goal -- ST: Continue with ST goal made to reduce added sugar, practice CHO counting, pair CHO foods with protien and fat - ex: fruit with nuts LT: manage  carbohydrates (choose complex CHO most often, 2-3 CHO for meals, 1-2 CHO for snacks, pair CHO with protein and/or fat)      Comment [name] says he hasnt really been counting carbs yet.  He states he does remember how to read a food label.  he does only eat whole grain bread etc.  He also likes beans. [name] continues to do well with his diet.  He is building muscle and maintaining his weight.  He is doing well with his protein. He is not card counting as much as looking at end results.  He is focused on balanced eating and gets lots of vegetables.  He is also eating more frequent small meals.      Expected Outcome Short:  focus on healthy choices Long: maintain heart healthy diet Short:  focus on healthy choices Long: maintain heart healthy diet  Nutrition Goals Discharge (Final Nutrition Goals Re-Evaluation):  Nutrition Goals Re-Evaluation - 02/26/21 0938       Goals   Nutrition Goal ST: Continue with ST goal made to reduce added sugar, practice CHO counting, pair CHO foods with protien and fat - ex: fruit with nuts LT: manage carbohydrates (choose complex CHO most often, 2-3 CHO for meals, 1-2 CHO for snacks, pair CHO with protein and/or fat)    Comment [name] continues to do well with his diet.  He is building muscle and maintaining his weight.  He is doing well with his protein. He is not card counting as much as looking at end results.  He is focused on balanced eating and gets lots of vegetables.  He is also eating more frequent small meals.    Expected Outcome Short:  focus on healthy choices Long: maintain heart healthy diet             Psychosocial: Target Goals: Acknowledge presence or absence of significant depression and/or stress, maximize coping skills, provide positive support system. Participant is able to verbalize types and ability to use techniques and skills needed for reducing stress and depression.   Education: Stress, Anxiety, and Depression - Group  verbal and visual presentation to define topics covered.  Reviews how body is impacted by stress, anxiety, and depression.  Also discusses healthy ways to reduce stress and to treat/manage anxiety and depression.  Written material given at graduation. Flowsheet Row Cardiac Rehab from 02/28/2021 in [facility] Cardiac and Pulmonary Rehab  Date 02/21/21  Educator [facility]  Instruction Review Code 1- Verbalizes Understanding       Education: Sleep Hygiene -Provides group verbal and written instruction about how sleep can affect your health.  Define sleep hygiene, discuss sleep cycles and impact of sleep habits. Review good sleep hygiene tips.    Initial Review & Psychosocial Screening:  Initial Psych Review & Screening - 12/26/20 0926       Initial Review   Current issues with History of Depression      Family Dynamics   Good Support System? Yes    Comments His wife and friends are a good support system for him. He had a history of depression but has since then been ok.      Barriers   Psychosocial barriers to participate in program There are no identifiable barriers or psychosocial needs.;The patient should benefit from training in stress management and relaxation.      Screening Interventions   Interventions Encouraged to exercise;Provide feedback about the scores to participant;To provide support and resources with identified psychosocial needs    Expected Outcomes Short Term goal: Utilizing psychosocial counselor, staff and physician to assist with identification of specific Stressors or current issues interfering with healing process. Setting desired goal for each stressor or current issue identified.;Long Term Goal: Stressors or current issues are controlled or eliminated.;Short Term goal: Identification and review with participant of any Quality of Life or Depression concerns found by scoring the questionnaire.;Long Term goal: The participant improves quality of Life and PHQ9 Scores as seen by  post scores and/or verbalization of changes             Quality of Life Scores:   Quality of Life - 03/12/21 0937       Quality of Life Scores   Health/Function Pre 21.47 %    Health/Function Post 27.1 %    Health/Function % Change 26.22 %    Socioeconomic Pre 26.29 %    Socioeconomic  Post 28 %    Socioeconomic % Change  6.5 %    Psych/Spiritual Pre 24.07 %    Psych/Spiritual Post 25.29 %    Psych/Spiritual % Change 5.07 %    Family Pre 22.4 %    Family Post 26.4 %    Family % Change 17.86 %    GLOBAL Pre 23.13 %    GLOBAL Post 26.84 %    GLOBAL % Change 16.04 %            Scores of 19 and below usually indicate a poorer quality of life in these areas.  A difference of  2-3 points is a clinically meaningful difference.  A difference of 2-3 points in the total score of the Quality of Life Index has been associated with significant improvement in overall quality of life, self-image, physical symptoms, and general health in studies assessing change in quality of life.  PHQ-9: Recent Review Flowsheet Data     Depression screen [facility] 2/9 03/12/2021 12/31/2020 06/06/2020 05/29/2020 10/18/2019   Decreased Interest 0 0 0 0 0   Down, Depressed, Hopeless 0 0 0 0 0   PHQ - 2 Score 0 0 0 0 0   Altered sleeping 0 0 - - -   Tired, decreased energy 0 2 - - -   Change in appetite 0 1 - - -   Feeling bad or failure about yourself  0 0 - - -   Trouble concentrating 0 0 - - -   Moving slowly or fidgety/restless 0 0 - - -   Suicidal thoughts 0 0 - - -   PHQ-9 Score 0 3 - - -   Difficult doing work/chores Not difficult at all Not difficult at all - - -      Interpretation of Total Score  Total Score Depression Severity:  1-4 = Minimal depression, 5-9 = Mild depression, 10-14 = Moderate depression, 15-19 = Moderately severe depression, 20-27 = Severe depression   Psychosocial Evaluation and Intervention:  Psychosocial Evaluation - 12/26/20 0930       Psychosocial Evaluation &  Interventions   Interventions Encouraged to exercise with the program and follow exercise prescription;Stress management education;Relaxation education    Comments His wife and friends are a good support system for him. He had a history of depression but has since then been ok.    Expected Outcomes Short: Exercise regularly to support mental health and notify staff of any changes. Long: maintain mental health and well being through teaching of rehab or prescribed medications independently.    Continue Psychosocial Services  Follow up required by staff             Psychosocial Re-Evaluation:  Psychosocial Re-Evaluation     [place] Name 01/22/21 1448 02/26/21 0935           Psychosocial Re-Evaluation   Current issues with Current Stress Concerns Current Stress Concerns;Current Sleep Concerns      Comments [name] really wants to get back to riding his motorcycle.  He finds this very relaxing.  He plans to try a short ride sometime soon.  He does keep stress low by learning  to not get upset. [name] is doing well in rehab.  He spent the weekend building a cross for his son's wedding next weekend.  He is feeling good mentally.  He continues to just let stress roll off.  He used to get intense but since retiring he's no longer in a hurry to get things  done.  He has not been sleeping well recently. His lasix has him getting up frequent and has a hard time getting back to sleep.  He has tried melatonin some but it did not work.  He is able to use tylenol PM with some success.      Expected Outcomes Short: get a bike ride soon Long: maintain positive outlook Short: Continue to work on sleep Long: Continue to stay positive.      Interventions -- Encouraged to attend Cardiac Rehabilitation for the exercise      Continue Psychosocial Services  -- Follow up required by staff               Psychosocial Discharge (Final Psychosocial Re-Evaluation):  Psychosocial Re-Evaluation - 02/26/21 0935        Psychosocial Re-Evaluation   Current issues with Current Stress Concerns;Current Sleep Concerns    Comments [name] is doing well in rehab.  He spent the weekend building a cross for his son's wedding next weekend.  He is feeling good mentally.  He continues to just let stress roll off.  He used to get intense but since retiring he's no longer in a hurry to get things done.  He has not been sleeping well recently. His lasix has him getting up frequent and has a hard time getting back to sleep.  He has tried melatonin some but it did not work.  He is able to use tylenol PM with some success.    Expected Outcomes Short: Continue to work on sleep Long: Continue to stay positive.    Interventions Encouraged to attend Cardiac Rehabilitation for the exercise    Continue Psychosocial Services  Follow up required by staff             Vocational Rehabilitation: Provide vocational rehab assistance to qualifying candidates.   Vocational Rehab Evaluation & Intervention:  Vocational Rehab - 02/26/21 0929       Initial Vocational Rehab Evaluation & Intervention   Assessment shows need for Vocational Rehabilitation No             Education: Education Goals: Education classes will be provided on a variety of topics geared toward better understanding of heart health and risk factor modification. Participant will state understanding/return demonstration of topics presented as noted by education test scores.  Learning Barriers/Preferences:  Learning Barriers/Preferences - 12/26/20 0925       Learning Barriers/Preferences   Learning Barriers None    Learning Preferences None             General Cardiac Education Topics:  AED/CPR: - Group verbal and written instruction with the use of models to demonstrate the basic use of the AED with the basic ABC's of resuscitation.   Anatomy and Cardiac Procedures: - Group verbal and visual presentation and models provide information about basic  cardiac anatomy and function. Reviews the testing methods done to diagnose heart disease and the outcomes of the test results. Describes the treatment choices: Medical Management, Angioplasty, or Coronary Bypass Surgery for treating various heart conditions including Myocardial Infarction, Angina, Valve Disease, and Cardiac Arrhythmias.  Written material given at graduation. Flowsheet Row Cardiac Rehab from 02/28/2021 in [facility] Cardiac and Pulmonary Rehab  Date 01/10/21  Educator [facility]  Instruction Review Code 1- Verbalizes Understanding       Medication Safety: - Group verbal and visual instruction to review commonly prescribed medications for heart and lung disease. Reviews the medication, class of the drug, and side effects. Includes the  steps to properly store meds and maintain the prescription regimen.  Written material given at graduation.   Intimacy: - Group verbal instruction through game format to discuss how heart and lung disease can affect sexual intimacy. Written material given at graduation.. Flowsheet Row Cardiac Rehab from 02/28/2021 in [facility] Cardiac and Pulmonary Rehab  Date 01/03/21  Educator [facility]  Instruction Review Code 1- Verbalizes Understanding       Know Your Numbers and Heart Failure: - Group verbal and visual instruction to discuss disease risk factors for cardiac and pulmonary disease and treatment options.  Reviews associated critical values for Overweight/Obesity, Hypertension, Cholesterol, and Diabetes.  Discusses basics of heart failure: signs/symptoms and treatments.  Introduces Heart Failure Zone chart for action plan for heart failure.  Written material given at graduation. Flowsheet Row Cardiac Rehab from 02/28/2021 in [facility] Cardiac and Pulmonary Rehab  Education need identified 12/31/20  Date 02/07/21  Educator KB  Instruction Review Code 1- Verbalizes Understanding       Infection Prevention: - Provides verbal and written material to individual with  discussion of infection control including proper hand washing and proper equipment cleaning during exercise session. Flowsheet Row Cardiac Rehab from 02/28/2021 in [facility] Cardiac and Pulmonary Rehab  Date 12/26/20  Educator [facility]  Instruction Review Code 1- Verbalizes Understanding       Falls Prevention: - Provides verbal and written material to individual with discussion of falls prevention and safety. Flowsheet Row Cardiac Rehab from 02/28/2021 in [facility] Cardiac and Pulmonary Rehab  Date 12/26/20  Educator Cherylann Banas  Instruction Review Code 1- Verbalizes Understanding       Other: -Provides group and verbal instruction on various topics (see comments)   Knowledge Questionnaire Score:  Knowledge Questionnaire Score - 03/12/21 9604       Knowledge Questionnaire Score   Pre Score 24/26 Education Focus: Smoking, Nutrition    Post Score 25/26             Core Components/Risk Factors/Patient Goals at Admission:  Personal Goals and Risk Factors at Admission - 12/31/20 1200       Core Components/Risk Factors/Patient Goals on Admission    Weight Management Yes;Weight Loss    Intervention Weight Management: Develop a combined nutrition and exercise program designed to reach desired caloric intake, while maintaining appropriate intake of nutrient and fiber, sodium and fats, and appropriate energy expenditure required for the weight goal.;Weight Management: Provide education and appropriate resources to help participant work on and attain dietary goals.;Weight Management/Obesity: Establish reasonable short term and long term weight goals.    Admit Weight 184 lb (83.5 kg)    Goal Weight: Short Term 180 lb (81.6 kg)    Goal Weight: Long Term 174 lb (78.9 kg)    Expected Outcomes Short Term: Continue to assess and modify interventions until short term weight is achieved;Long Term: Adherence to nutrition and physical activity/exercise program aimed toward attainment of established weight  goal;Weight Loss: Understanding of general recommendations for a balanced deficit meal plan, which promotes 1-2 lb weight loss per week and includes a negative energy balance of [id number] kcal/d;Understanding recommendations for meals to include 15-35% energy as protein, 25-35% energy from fat, 35-60% energy from carbohydrates, less than 240m of dietary cholesterol, 20-35 gm of total fiber daily;Understanding of distribution of calorie intake throughout the day with the consumption of 4-5 meals/snacks    Diabetes Yes    Intervention Provide education about signs/symptoms and action to take for hypo/hyperglycemia.;Provide education about proper nutrition, including hydration, and  aerobic/resistive exercise prescription along with prescribed medications to achieve blood glucose in normal ranges: Fasting glucose 65-99 mg/dL    Expected Outcomes Long Term: Attainment of HbA1C < 7%.;Short Term: Participant verbalizes understanding of the signs/symptoms and immediate care of hyper/hypoglycemia, proper foot care and importance of medication, aerobic/resistive exercise and nutrition plan for blood glucose control.    Heart Failure Yes    Intervention Provide a combined exercise and nutrition program that is supplemented with education, support and counseling about heart failure. Directed toward relieving symptoms such as shortness of breath, decreased exercise tolerance, and extremity edema.    Expected Outcomes Improve functional capacity of life;Short term: Attendance in program 2-3 days a week with increased exercise capacity. Reported lower sodium intake. Reported increased fruit and vegetable intake. Reports medication compliance.;Short term: Daily weights obtained and reported for increase. Utilizing diuretic protocols set by physician.;Long term: Adoption of self-care skills and reduction of barriers for early signs and symptoms recognition and intervention leading to self-care maintenance.    Hypertension Yes     Intervention Provide education on lifestyle modifcations including regular physical activity/exercise, weight management, moderate sodium restriction and increased consumption of fresh fruit, vegetables, and low fat dairy, alcohol moderation, and smoking cessation.;Monitor prescription use compliance.    Expected Outcomes Long Term: Maintenance of blood pressure at goal levels.;Short Term: Continued assessment and intervention until BP is < 140/26m HG in hypertensive participants. < 130/868mHG in hypertensive participants with diabetes, heart failure or chronic kidney disease.    Lipids Yes    Intervention Provide education and support for participant on nutrition & aerobic/resistive exercise along with prescribed medications to achieve LDL <7055mHDL >36m12m  Expected Outcomes Short Term: Participant states understanding of desired cholesterol values and is compliant with medications prescribed. Participant is following exercise prescription and nutrition guidelines.;Long Term: Cholesterol controlled with medications as prescribed, with individualized exercise RX and with personalized nutrition plan. Value goals: LDL < 70mg41mL > 40 mg.             Education:Diabetes - Individual verbal and written instruction to review signs/symptoms of diabetes, desired ranges of glucose level fasting, after meals and with exercise. Acknowledge that pre and post exercise glucose checks will be done for 3 sessions at entry of program. [place] [facility]  Date 12/26/20  Educator [facility] Vallejotruction Review Code 1- Verbalizes Understanding       Core Components/Risk Factors/Patient Goals Review:   Goals and Risk Factor Review     Row Name 01/22/21 0930 02/26/21 0939           Core Components/Risk Factors/Patient Goals Review   Personal Goals Review Weight Management/Obesity;Heart Failure;Diabetes;Hypertension Weight  Management/Obesity;Heart Failure;Diabetes;Hypertension      Review [name] check his weight and BG at home.  His weight is about the same but he feels his appetite is back and he is building muscle back.  He also monitors BP at home.  He wants to "take his life back".  He would like to ride his motocycle here. RogerAnnieoing well with his weight and still feels like he is gaining muscle.  His pressures have been good still.  His sugars seem okay, but he has not been checking them.  He denies heart failure symptoms. He is eating more small meals to help balance.      Expected Outcomes Short: continue to monitor vitals Long: manage risk factors long term Short: Continue  to work on [employer] Long: Continue to manage heart failure               Core Components/Risk Factors/Patient Goals at Discharge (Final Review):   Goals and Risk Factor Review - 02/26/21 0939       Core Components/Risk Factors/Patient Goals Review   Personal Goals Review Weight Management/Obesity;Heart Failure;Diabetes;Hypertension    Review Talal is doing well with his weight and still feels like he is gaining muscle.  His pressures have been good still.  His sugars seem okay, but he has not been checking them.  He denies heart failure symptoms. He is eating more small meals to help balance.    Expected Outcomes Short: Continue to work on weight Long: Continue to manage heart failure             ITP Comments:  ITP Comments     Row Name 12/26/20 0932 12/31/20 1149 01/01/21 0922 01/07/21 0835 01/16/21 0809   ITP Comments Virtual Visit completed. Patient informed on EP and RD appointment and 6 Minute walk test. Patient also informed of patient health questionnaires on My Chart. Patient Verbalizes understanding. Visit diagnosis [facility] in [facility]/15/[facility]. Completed 6MWT and gym orientation. Initial ITP created and sent for review to Dr. Emily Filbert, Medical Director. First full day of exercise!  Patient was oriented to gym  and equipment including functions, settings, policies, and procedures.  Patient's individual exercise prescription and treatment plan were reviewed.  All starting workloads were established based on the results of the 6 minute walk test done at initial orientation visit.  The plan for exercise progression was also introduced and progression will be customized based on patient's performance and goals. Completed initial RD consultation 30 Day review completed. Medical Director ITP review done, changes made as directed, and signed approval by Medical Director.    [place] Name 02/13/21 1443 03/13/21 0820         ITP Comments 30 day review completed. ITP sent to Dr. Emily Filbert, Medical Director of Cardiac Rehab. Continue with ITP unless changes are made by physician. 30 Day review completed. Medical Director ITP review done, changes made as directed, and signed approval by Medical Director.               Comments:  30 Day review completed. Medical Director ITP review done, changes made as directed, and signed approval by Medical Director.

## 2021-03-14 ENCOUNTER — Encounter: Payer: Medicare Other | Admitting: *Deleted

## 2021-03-14 ENCOUNTER — Other Ambulatory Visit: Payer: Self-pay

## 2021-03-14 DIAGNOSIS — Z951 Presence of aortocoronary bypass graft: Secondary | ICD-10-CM | POA: Diagnosis not present

## 2021-03-14 NOTE — Progress Notes (Signed)
Daily Session Note  Patient Details  Name: [name] MRN: [id number] Date of Birth: 04/06/1951 Referring Provider:   Flowsheet Row Cardiac Rehab from 12/31/2020 in [facility] Cardiac and Pulmonary Rehab  Referring Provider [name] MD       Encounter Date: 03/14/2021  Check In:  Session Check In - 03/14/21 0955       Check-In   Supervising physician immediately available to respond to emergencies See telemetry face sheet for immediately available ER MD    Location [facility]-Cardiac & Pulmonary Rehab    Staff Present Nyoka Cowden, RN, BSN, Tyna Jaksch, MS, ASCM CEP, Exercise Physiologist;Amanda Oletta Darter, [place], ACSM CEP, Exercise Physiologist    Virtual Visit No    Medication changes reported     No    Fall or balance concerns reported    No    Tobacco Cessation No Change    Warm-up and Cool-down Performed on first and last piece of equipment    Resistance Training Performed Yes    VAD Patient? No    PAD/SET Patient? No      Pain Assessment   Currently in Pain? No/denies                Social History   Tobacco Use  Smoking Status Former   Packs/day: 1.50   Years: 40.00   Pack years: 60.00   Types: Cigarettes   Quit date: 09/14/2007   Years since quitting: 13.5  Smokeless Tobacco Never  Tobacco Comments   quit 2009    Goals Met:    Goals Unmet:  Not Applicable  Comments: Pt able to follow exercise prescription today without complaint.  Will continue to monitor for progression.   Dr. Emily Filbert is Medical Director for [place].  Dr. Ottie Glazier is Medical Director for [facility] Pulmonary Rehabilitation.

## 2021-03-15 ENCOUNTER — Ambulatory Visit: Payer: Medicare Other | Admitting: Family Medicine

## 2021-03-19 ENCOUNTER — Other Ambulatory Visit: Payer: Self-pay

## 2021-03-19 DIAGNOSIS — Z951 Presence of aortocoronary bypass graft: Secondary | ICD-10-CM | POA: Diagnosis not present

## 2021-03-19 NOTE — Progress Notes (Signed)
Daily Session Note  Patient Details  Name: [name] MRN: [id number] Date of Birth: [birth year] Referring Provider:   Flowsheet Row Cardiac Rehab from 12/31/2020 in [facility] Cardiac and Pulmonary Rehab  Referring Provider [name] MD       Encounter Date: 03/19/2021  Check In:  Session Check In - 03/19/21 0938       Check-In   Supervising physician immediately available to respond to emergencies See telemetry face sheet for immediately available ER MD    Location [facility]-Cardiac & Pulmonary Rehab    Staff Present Birdie Sons, MPA, RN;Amanda Oletta Darter, BA, ACSM CEP, Exercise Physiologist;Melissa Caiola, RDN, LDN    Virtual Visit No    Medication changes reported     No    Fall or balance concerns reported    No    Tobacco Cessation No Change    Warm-up and Cool-down Performed on first and last piece of equipment    Resistance Training Performed Yes    VAD Patient? No    PAD/SET Patient? No      Pain Assessment   Currently in Pain? No/denies                Social History   Tobacco Use  Smoking Status Former   Packs/day: 1.50   Years: 40.00   Pack years: 60.00   Types: Cigarettes   Quit date: 09/14/2007   Years since quitting: 13.5  Smokeless Tobacco Never  Tobacco Comments   quit 2009    Goals Met:  Independence with exercise equipment Exercise tolerated well No report of concerns or symptoms today Strength training completed today  Goals Unmet:  Not Applicable  Comments: Pt able to follow exercise prescription today without complaint.  Will continue to monitor for progression.    Dr. Emily Filbert is Medical Director for [place].  Dr. Ottie Glazier is Medical Director for [facility] Pulmonary Rehabilitation.

## 2021-03-20 ENCOUNTER — Encounter: Payer: Self-pay | Admitting: Family Medicine

## 2021-03-20 ENCOUNTER — Ambulatory Visit (INDEPENDENT_AMBULATORY_CARE_PROVIDER_SITE_OTHER): Payer: Medicare Other | Admitting: Family Medicine

## 2021-03-20 ENCOUNTER — Other Ambulatory Visit: Payer: Self-pay

## 2021-03-20 VITALS — BP 110/70 | HR 78 | Temp 98.1°F | Ht 68.0 in | Wt 183.8 lb

## 2021-03-20 DIAGNOSIS — I25110 Atherosclerotic heart disease of native coronary artery with unstable angina pectoris: Secondary | ICD-10-CM | POA: Diagnosis not present

## 2021-03-20 DIAGNOSIS — Z23 Encounter for immunization: Secondary | ICD-10-CM

## 2021-03-20 DIAGNOSIS — I200 Unstable angina: Secondary | ICD-10-CM | POA: Diagnosis not present

## 2021-03-20 DIAGNOSIS — G4733 Obstructive sleep apnea (adult) (pediatric): Secondary | ICD-10-CM

## 2021-03-20 DIAGNOSIS — I10 Essential (primary) hypertension: Secondary | ICD-10-CM

## 2021-03-20 DIAGNOSIS — R252 Cramp and spasm: Secondary | ICD-10-CM | POA: Diagnosis not present

## 2021-03-20 DIAGNOSIS — E1165 Type 2 diabetes mellitus with hyperglycemia: Secondary | ICD-10-CM | POA: Diagnosis not present

## 2021-03-20 LAB — BASIC METABOLIC PANEL
BUN: 22 mg/dL (ref 6–23)
CO2: 28 mEq/L (ref 19–32)
Calcium: 9.7 mg/dL (ref 8.4–10.5)
Chloride: 99 mEq/L (ref 96–112)
Creatinine, Ser: 0.99 mg/dL (ref 0.40–1.50)
GFR: 77.28 mL/min (ref >60.00)
Glucose, Bld: 339 mg/dL — ABNORMAL HIGH (ref 70–99)
Potassium: 4 mEq/L (ref 3.5–5.1)
Sodium: 133 mEq/L — ABNORMAL LOW (ref 135–145)

## 2021-03-20 LAB — HEMOGLOBIN A1C: Hgb A1c MFr Bld: 11.2 % — ABNORMAL HIGH (ref 4.6–6.5)

## 2021-03-20 NOTE — Assessment & Plan Note (Signed)
Discussed potential causes including inadequate water intake, inadequate stretching, and electrolyte disturbances.  He takes in adequate water so that is unlikely to be the cause.  Discussed evaluation with a BMP.  Advised on stretching before he takes his naps.  Discussed he could try a teaspoon of yellow mustard to see if that would make a difference.

## 2021-03-20 NOTE — Assessment & Plan Note (Signed)
Well-controlled.  He will continue his carvedilol, Lasix, losartan, and Imdur.

## 2021-03-20 NOTE — Patient Instructions (Signed)
Nice to see you. We will get lab work today. Please try the stretches that we discussed.  You can also try yellow mustard to see if that helps with your cramps.

## 2021-03-20 NOTE — Progress Notes (Signed)
[name], MD Phone: [phone]  [name] is a 70 y.o. male who presents today for f/u.  HYPERTENSION/CAD Disease Monitoring Home BP Monitoring 100/53 yesterday at cardiac rehab per patient report Chest pain- no    Dyspnea- no Medications Compliance-  taking lipitor, coreg, zetia, lasix, losartan, imdur, crestor. RUQ pain: no   Myalgia: no        Edema- no BMET    Component Value Date/Time   NA 142 12/07/2020 1114   K 4.1 12/07/2020 1114   CL 98 12/07/2020 1114   CO2 24 12/07/2020 1114   GLUCOSE 186 (H) 12/07/2020 1114   GLUCOSE 124 (H) 11/23/2020 0430   BUN 20 12/07/2020 1114   CREATININE 1.07 12/07/2020 1114   CALCIUM 10.2 12/07/2020 1114   GFRNONAA >60 11/23/2020 0430   GFRAA >60 05/22/2018 1743   DIABETES Disease Monitoring: Blood Sugar ranges-150 or less Polyuria/phagia/dipsia- no      Optho- scheduled Medications: Compliance- taking jardiance, metformin, has not brought himself to start the trulicity, he is afraid of the needle aspect of this medication Hypoglycemic symptoms- no He is exercising by walking 5 to 6 days a week.  OSA CPAP use: nightly 8 hours Hypersomnia: no Well rested: yes CPAP company: [employer]  Foot cramps: This has been an ongoing issue.  Only occurs when he takes a nap in the afternoon.  He does drink plenty of water.  He is on Lasix and does take a potassium supplement.   Social History   Tobacco Use  Smoking Status Former   Packs/day: 1.50   Years: 40.00   Pack years: 60.00   Types: Cigarettes   Quit date: 09/14/2007   Years since quitting: 13.5  Smokeless Tobacco Never  Tobacco Comments   quit 2009    Current Outpatient Medications on File Prior to Visit  Medication Sig Dispense Refill   aspirin EC 325 MG EC tablet Take 1 tablet (325 mg total) by mouth daily. 30 tablet 0   atorvastatin (LIPITOR) 80 MG tablet Take 1 tablet (80 mg total) by mouth daily. 90 tablet 3   carvedilol (COREG) 6.25 MG tablet Take 1  tablet (6.25 mg total) by mouth 2 (two) times daily with a meal. 180 tablet 3   diphenhydramine-acetaminophen (TYLENOL PM) 25-500 MG TABS tablet Take 2 tablets by mouth at bedtime.     Dulaglutide (TRULICITY) 6.21 HY/8.6VH SOPN Inject 0.75 mg into the skin once a week. 6 mL 1   ezetimibe (ZETIA) 10 MG tablet Take 1 tablet (10 mg total) by mouth daily. 90 tablet 3   furosemide (LASIX) 40 MG tablet Take 1 tablet (40 mg total) by mouth daily. 90 tablet 3   isosorbide mononitrate (IMDUR) 30 MG 24 hr tablet Take 1 tablet (30 mg total) by mouth daily. 90 tablet 3   JARDIANCE 25 MG TABS tablet TAKE 1 TABLET DAILY 90 tablet 3   losartan (COZAAR) 25 MG tablet Take 1 tablet (25 mg total) by mouth daily. 90 tablet 3   metFORMIN (GLUCOPHAGE) 1000 MG tablet TAKE 1 TABLET TWICE A DAY WITH A MEAL 180 tablet 3   Multiple Vitamins-Minerals (MULTIVITAMINS THER. W/MINERALS) TABS Take 1 tablet by mouth every morning.     niacin (NIASPAN) 1000 MG CR tablet TAKE 2 TABLETS DAILY AT BEDTIME 180 tablet 2   potassium chloride SA (KLOR-CON) 20 MEQ tablet Take 1 tablet (20 mEq total) by mouth daily. 90 tablet 3   No current facility-administered medications on file prior to visit.  ROS see history of present illness  Objective  Physical Exam Vitals:   03/20/21 0932  BP: 110/70  Pulse: 78  Temp: 98.1 F (36.7 C)  SpO2: 98%    BP Readings from Last 3 Encounters:  03/20/21 110/70  03/11/21 122/70  12/24/20 118/72   Wt Readings from Last 3 Encounters:  03/20/21 183 lb 12.8 oz (83.4 kg)  03/11/21 183 lb 2 oz (83.1 kg)  03/07/21 183 lb (83 kg)    Physical Exam Constitutional:      General: He is not in acute distress.    Appearance: He is not diaphoretic.  Cardiovascular:     Rate and Rhythm: Normal rate and regular rhythm.     Heart sounds: Normal heart sounds.  Pulmonary:     Effort: Pulmonary effort is normal.     Breath sounds: Normal breath sounds.  Skin:    General: Skin is warm and dry.   Neurological:     Mental Status: He is alert.   Diabetic Foot Exam - Simple   Simple Foot Form Diabetic Foot exam was performed with the following findings: Yes 03/20/2021  9:55 AM  Visual Inspection No deformities, no ulcerations, no other skin breakdown bilaterally: Yes Sensation Testing Intact to touch and monofilament testing bilaterally: Yes Pulse Check Posterior Tibialis and Dorsalis pulse intact bilaterally: Yes Comments      Assessment/Plan: Please see individual problem list.  Problem List Items Addressed This Visit     CAD (coronary artery disease) (Chronic)    Asymptomatic.  He will continue his aspirin, Lipitor 80 mg once daily, carvedilol 6.25 mg twice daily, Zetia 10 mg daily, Lasix 40 mg daily, Imdur 30 mg daily, and losartan 25 mg daily.      HTN (hypertension) (Chronic)    Well-controlled.  He will continue his carvedilol, Lasix, losartan, and Imdur.      Foot cramps    Discussed potential causes including inadequate water intake, inadequate stretching, and electrolyte disturbances.  He takes in adequate water so that is unlikely to be the cause.  Discussed evaluation with a BMP.  Advised on stretching before he takes his naps.  Discussed he could try a teaspoon of yellow mustard to see if that would make a difference.      Relevant Orders   Basic Metabolic Panel (BMET)   OSA (obstructive sleep apnea)    He appears to be benefiting from his CPAP.  He will continue use of this.  We will request a compliance report.      Uncontrolled type 2 diabetes mellitus with hyperglycemia ([place])    He will continue Jardiance and metformin.  CMA provided some Trulicity teaching.  I encouraged the patient to start on this.  I discussed that the needle was quite small and he would likely not even know that he was being stuck.  Check A1c.      Relevant Orders   HgB A1c   Other Visit Diagnoses     Need for immunization against influenza    -  Primary   Relevant Orders    Flu Vaccine QUAD High Dose(Fluad) (Completed)        Health Maintenance: Flu vaccine and Pneumovax will be given today.  Discussed the need for colonoscopy.  The patient defers this at this time.  We will check with his cardiologist to see when he would be okay to proceed with that.  Return in about 3 months (around 06/20/2021) for Diabetes.  This visit occurred during the  SARS-CoV-2 public health emergency.  Safety protocols were in place, including screening questions prior to the visit, additional usage of staff PPE, and extensive cleaning of exam room while observing appropriate contact time as indicated for disinfecting solutions.    Farhad Burleson, MD [facility] [place]  

## 2021-03-20 NOTE — Assessment & Plan Note (Signed)
Asymptomatic.  He will continue his aspirin, Lipitor 80 mg once daily, carvedilol 6.25 mg twice daily, Zetia 10 mg daily, Lasix 40 mg daily, Imdur 30 mg daily, and losartan 25 mg daily.

## 2021-03-20 NOTE — Assessment & Plan Note (Signed)
He appears to be benefiting from his CPAP.  He will continue use of this.  We will request a compliance report.

## 2021-03-20 NOTE — Addendum Note (Signed)
Addended by: Kerin Salen R on: 03/20/2021 11:50 AM   Modules accepted: Orders

## 2021-03-20 NOTE — Assessment & Plan Note (Signed)
He will continue Jardiance and metformin.  CMA provided some Trulicity teaching.  I encouraged the patient to start on this.  I discussed that the needle was quite small and he would likely not even know that he was being stuck.  Check A1c.

## 2021-03-21 DIAGNOSIS — Z951 Presence of aortocoronary bypass graft: Secondary | ICD-10-CM

## 2021-03-21 NOTE — Progress Notes (Signed)
Daily Session Note  Patient Details  Name: [name] MRN: [id number] Date of Birth: 1951-01-06 Referring Provider:   Flowsheet Row Cardiac Rehab from 12/31/2020 in [facility] Cardiac and Pulmonary Rehab  Referring Provider [name] MD       Encounter Date: 03/21/2021  Check In:  Session Check In - 03/21/21 0918       Check-In   Supervising physician immediately available to respond to emergencies See telemetry face sheet for immediately available ER MD    Location [facility]-Cardiac & Pulmonary Rehab    Staff Present Birdie Sons, MPA, RN;Jessica [place], MA, RCEP, CCRP, [place], BS, ACSM CEP, Exercise Physiologist    Virtual Visit No    Medication changes reported     No    Fall or balance concerns reported    No    Tobacco Cessation No Change    Warm-up and Cool-down Performed on first and last piece of equipment    Resistance Training Performed Yes    VAD Patient? No    PAD/SET Patient? No      Pain Assessment   Currently in Pain? No/denies                Social History   Tobacco Use  Smoking Status Former   Packs/day: 1.50   Years: 40.00   Pack years: 60.00   Types: Cigarettes   Quit date: 09/14/2007   Years since quitting: 13.5  Smokeless Tobacco Never  Tobacco Comments   quit 2009    Goals Met:  Independence with exercise equipment Exercise tolerated well No report of concerns or symptoms today Strength training completed today  Goals Unmet:  Not Applicable  Comments: Pt able to follow exercise prescription today without complaint.  Will continue to monitor for progression.    Dr. Emily Filbert is Medical Director for [place].  Dr. Ottie Glazier is Medical Director for [facility] Pulmonary Rehabilitation.

## 2021-03-21 NOTE — Patient Instructions (Signed)
Discharge Patient Instructions  Patient Details  Name: [name] MRN: [id number] Date of Birth: 1950-12-28 Referring Provider:  Minna Merritts, MD   Number of Visits: 44  Reason for Discharge:  Patient reached a stable level of exercise. Patient independent in their exercise. Patient has met program and personal goals.   Diagnosis:  S/P CABG x 5  Initial Exercise Prescription:  Initial Exercise Prescription - 12/31/20 1100       Date of Initial Exercise RX and Referring Provider   Date 12/31/20    Referring Provider [name] MD      Treadmill   MPH 2.7    Grade 1    Minutes 15    METs 3.44      NuStep   Level 3    SPM 80    Minutes 15    METs 3.3      Elliptical   Level 1    Speed 3    Minutes 15    METs 3      Prescription Details   Frequency (times per week) 2    Duration Progress to 30 minutes of continuous aerobic without signs/symptoms of physical distress      Intensity   THRR 40-80% of Max Heartrate 110-137    Ratings of Perceived Exertion 11-13    Perceived Dyspnea 0-4      Progression   Progression Continue to progress workloads to maintain intensity without signs/symptoms of physical distress.      Resistance Training   Training Prescription Yes    Weight 4 lb    Reps 10-15             Discharge Exercise Prescription (Final Exercise Prescription Changes):  Exercise Prescription Changes - 03/20/21 1400       Response to Exercise   Blood Pressure (Admit) 112/58    Blood Pressure (Exit) 104/54    Heart Rate (Admit) 94 bpm    Heart Rate (Exercise) 129 bpm    Heart Rate (Exit) 113 bpm    Oxygen Saturation (Admit) 95 %    Oxygen Saturation (Exercise) 94 %    Oxygen Saturation (Exit) 95 %    Rating of Perceived Exertion (Exercise) 14    Symptoms none    Duration Continue with 30 min of aerobic exercise without signs/symptoms of physical distress.    Intensity THRR unchanged      Progression   Progression  Continue to progress workloads to maintain intensity without signs/symptoms of physical distress.    Average METs 5      Resistance Training   Training Prescription Yes    Weight 10 lb    Reps 10-15      Interval Training   Interval Training No      Treadmill   MPH 3.6    Grade 2.5   intervals   Minutes 15    METs 5      NuStep   Level 5    Minutes 15      Home Exercise Plan   Plans to continue exercise at [employer] (comment)   [employer]   Frequency Add 3 additional days to program exercise sessions.    Initial Home Exercises Provided 01/10/21             Functional Capacity:  6 Minute Walk     Row Name 12/31/20 1154 03/07/21 0934       6 Minute Walk   Phase Initial Discharge    Distance  1445 feet 1805 feet    Distance % Change -- 24.9 %    Distance Feet Change -- 360 ft    Walk Time 6 minutes 6 minutes    # of Rest Breaks 0 0    MPH 2.74 3.42    METS 3.33 4.26    RPE 9 14    Perceived Dyspnea  0 0    VO2 Peak 11.67 14.89    Symptoms Yes (comment) No    Comments chronic left hip pain 2/10 --    Resting HR 83 bpm 89 bpm    Resting BP 126/74 124/70    Resting Oxygen Saturation  97 % 98 %    Exercise Oxygen Saturation  during 6 min walk 98 % 98 %    Max Ex. HR 107 bpm 138 bpm    Max Ex. BP 142/70 138/70    2 Minute Post BP 122/66 --             Nutrition & Weight - Outcomes:  Pre Biometrics - 12/31/20 1158       Pre Biometrics   Height 5' 8.6" (1.742 m)    Weight 184 lb (83.5 kg)    BMI (Calculated) 27.5    Single Leg Stand 17.5 seconds             Post Biometrics - 03/07/21 0935        Post  Biometrics   Height 5' 8.6" (1.742 m)    Weight 183 lb (83 kg)    BMI (Calculated) 27.35    Single Leg Stand 26.2 seconds             Nutrition:  Nutrition Therapy & Goals - 01/07/21 0800       Nutrition Therapy   Diet Heart healthy, low Na, diabetes friendly    Drug/Food Interactions Statins/Certain Fruits     Protein (specify units) 65g    Fiber 30 grams    Whole Grain Foods 3 servings    Saturated Fats 12 max. grams    Fruits and Vegetables 8 servings/day    Sodium 1.5 grams      Personal Nutrition Goals   Nutrition Goal ST: Continue with ST goal made to reduce added sugar, practice CHO counting, pair CHO foods with protien and fat - ex: fruit with nuts LT: manage carbohydrates (choose complex CHO most often, 2-3 CHO for meals, 1-2 CHO for snacks, pair CHO with protein and/or fat)    Comments He has seen a nutritionist previously and he has been slowly incorporating her tips. PYP score 72.  T2DM, no insulin. He feels he eats too mnay carbs, but he is monitoring sugar - he is not eating so many sweets. He would then like to start working on his carbs. Varied diet. B: crsipy oat squares with strawberries and blueberries and banana and 2% milk L: can have leftovers - his wife likes to eat out more than him: 1-2x/week goes to [employer] to eat (does not want to change this). D: cooks: tonight flounder and hushpuppy mix. He prefers fresh or frozen vegetables with his meals. S: red pears and other fruit during the day Drinks: water and sometimes cheerwine Reviewed heart healthy eating and diabetes friendly eating.      Intervention Plan   Intervention Prescribe, educate and counsel regarding individualized specific dietary modifications aiming towards targeted core components such as weight, hypertension, lipid management, diabetes, heart failure and other comorbidities.;Nutrition handout(s) given to patient.  Expected Outcomes Short Term Goal: Understand basic principles of dietary content, such as calories, fat, sodium, cholesterol and nutrients.;Short Term Goal: A plan has been developed with personal nutrition goals set during dietitian appointment.;Long Term Goal: Adherence to prescribed nutrition plan.              Goals reviewed with patient; copy given to patient.

## 2021-03-26 ENCOUNTER — Other Ambulatory Visit: Payer: Self-pay

## 2021-03-26 DIAGNOSIS — Z951 Presence of aortocoronary bypass graft: Secondary | ICD-10-CM | POA: Diagnosis not present

## 2021-03-26 NOTE — Progress Notes (Signed)
Daily Session Note  Patient Details  Name: [name] MRN: [id number] Date of Birth: 21-Dec-1950 Referring Provider:   Flowsheet Row Cardiac Rehab from 12/31/2020 in [facility] Cardiac and Pulmonary Rehab  Referring Provider [name] MD       Encounter Date: 03/26/2021  Check In:  Session Check In - 03/26/21 0924       Check-In   Supervising physician immediately available to respond to emergencies See telemetry face sheet for immediately available ER MD    Location [facility]-Cardiac & Pulmonary Rehab    Staff Present Birdie Sons, MPA, RN;Jessica [place], MA, RCEP, CCRP, CCET;Amanda Sommer, BA, ACSM CEP, Exercise Physiologist;Kara Eliezer Bottom, MS, ASCM CEP, Exercise Physiologist    Virtual Visit No    Medication changes reported     Yes    Comments started trulicity    Fall or balance concerns reported    No    Tobacco Cessation No Change    Warm-up and Cool-down Performed on first and last piece of equipment    Resistance Training Performed Yes    VAD Patient? No    PAD/SET Patient? No      Pain Assessment   Currently in Pain? No/denies                Social History   Tobacco Use  Smoking Status Former   Packs/day: 1.50   Years: 40.00   Pack years: 60.00   Types: Cigarettes   Quit date: 09/14/2007   Years since quitting: 13.5  Smokeless Tobacco Never  Tobacco Comments   quit 2009    Goals Met:  Independence with exercise equipment Exercise tolerated well No report of concerns or symptoms today Strength training completed today  Goals Unmet:  Not Applicable  Comments: Pt able to follow exercise prescription today without complaint.  Will continue to monitor for progression.    Dr. Emily Filbert is Medical Director for [place].  Dr. Ottie Glazier is Medical Director for [facility] Pulmonary Rehabilitation.

## 2021-03-26 NOTE — Patient Instructions (Signed)
Discharge Patient Instructions  Patient Details  Name: [name] MRN: [id number] Date of Birth: [birth year] Referring Provider:  Minna Merritts, MD   Number of Visits: 103  Reason for Discharge:  Patient reached a stable level of exercise. Patient independent in their exercise. Patient has met program and personal goals.  Smoking History:  Social History   Tobacco Use  Smoking Status Former   Packs/day: 1.50   Years: 40.00   Pack years: 60.00   Types: Cigarettes   Quit date: 09/14/2007   Years since quitting: 13.5  Smokeless Tobacco Never  Tobacco Comments   quit 2009    Diagnosis:  S/P CABG x 5  Initial Exercise Prescription:  Initial Exercise Prescription - 12/31/20 1100       Date of Initial Exercise RX and Referring Provider   Date 12/31/20    Referring Provider [name] MD      Treadmill   MPH 2.7    Grade 1    Minutes 15    METs 3.44      NuStep   Level 3    SPM 80    Minutes 15    METs 3.3      Elliptical   Level 1    Speed 3    Minutes 15    METs 3      Prescription Details   Frequency (times per week) 2    Duration Progress to 30 minutes of continuous aerobic without signs/symptoms of physical distress      Intensity   THRR 40-80% of Max Heartrate 110-137    Ratings of Perceived Exertion 11-13    Perceived Dyspnea 0-4      Progression   Progression Continue to progress workloads to maintain intensity without signs/symptoms of physical distress.      Resistance Training   Training Prescription Yes    Weight 4 lb    Reps 10-15             Discharge Exercise Prescription (Final Exercise Prescription Changes):  Exercise Prescription Changes - 03/20/21 1400       Response to Exercise   Blood Pressure (Admit) 112/58    Blood Pressure (Exit) 104/54    Heart Rate (Admit) 94 bpm    Heart Rate (Exercise) 129 bpm    Heart Rate (Exit) 113 bpm    Oxygen Saturation (Admit) 95 %    Oxygen Saturation (Exercise) 94  %    Oxygen Saturation (Exit) 95 %    Rating of Perceived Exertion (Exercise) 14    Symptoms none    Duration Continue with 30 min of aerobic exercise without signs/symptoms of physical distress.    Intensity THRR unchanged      Progression   Progression Continue to progress workloads to maintain intensity without signs/symptoms of physical distress.    Average METs 5      Resistance Training   Training Prescription Yes    Weight 10 lb    Reps 10-15      Interval Training   Interval Training No      Treadmill   MPH 3.6    Grade 2.5   intervals   Minutes 15    METs 5      NuStep   Level 5    Minutes 15      Home Exercise Plan   Plans to continue exercise at [employer] [facility] [employer] (comment)   [employer]   Frequency Add 3 additional days to program exercise sessions.  Initial Home Exercises Provided 01/10/21             Functional Capacity:  6 Minute Walk     Row Name 12/31/20 1154 03/07/21 0934       6 Minute Walk   Phase Initial Discharge    Distance 1445 feet 1805 feet    Distance % Change -- 24.9 %    Distance Feet Change -- 360 ft    Walk Time 6 minutes 6 minutes    # of Rest Breaks 0 0    MPH 2.74 3.42    METS 3.33 4.26    RPE 9 14    Perceived Dyspnea  0 0    VO2 Peak 11.67 14.89    Symptoms Yes (comment) No    Comments chronic left hip pain 2/10 --    Resting HR 83 bpm 89 bpm    Resting BP 126/74 124/70    Resting Oxygen Saturation  97 % 98 %    Exercise Oxygen Saturation  during 6 min walk 98 % 98 %    Max Ex. HR 107 bpm 138 bpm    Max Ex. BP 142/70 138/70    2 Minute Post BP 122/66 --               Nutrition & Weight - Outcomes:  Pre Biometrics - 12/31/20 1158       Pre Biometrics   Height 5' 8.6" (1.742 m)    Weight 184 lb (83.5 kg)    BMI (Calculated) 27.5    Single Leg Stand 17.5 seconds             Post Biometrics - 03/07/21 0935        Post  Biometrics   Height 5' 8.6" (1.742 m)    Weight 183 lb (83 kg)     BMI (Calculated) 27.35    Single Leg Stand 26.2 seconds             Nutrition:  Nutrition Therapy & Goals - 01/07/21 0800       Nutrition Therapy   Diet Heart healthy, low Na, diabetes friendly    Drug/Food Interactions Statins/Certain Fruits    Protein (specify units) 65g    Fiber 30 grams    Whole Grain Foods 3 servings    Saturated Fats 12 max. grams    Fruits and Vegetables 8 servings/day    Sodium 1.5 grams      Personal Nutrition Goals   Nutrition Goal ST: Continue with ST goal made to reduce added sugar, practice CHO counting, pair CHO foods with protien and fat - ex: fruit with nuts LT: manage carbohydrates (choose complex CHO most often, 2-3 CHO for meals, 1-2 CHO for snacks, pair CHO with protein and/or fat)    Comments He has seen a nutritionist previously and he has been slowly incorporating her tips. PYP score 72.  T2DM, no insulin. He feels he eats too mnay carbs, but he is monitoring sugar - he is not eating so many sweets. He would then like to start working on his carbs. Varied diet. B: crsipy oat squares with strawberries and blueberries and banana and 2% milk L: can have leftovers - his wife likes to eat out more than him: 1-2x/week goes to [employer] to eat (does not want to change this). D: cooks: tonight flounder and hushpuppy mix. He prefers fresh or frozen vegetables with his meals. S: red pears and other fruit during the day Drinks: water  and sometimes cheerwine Reviewed heart healthy eating and diabetes friendly eating.      Intervention Plan   Intervention Prescribe, educate and counsel regarding individualized specific dietary modifications aiming towards targeted core components such as weight, hypertension, lipid management, diabetes, heart failure and other comorbidities.;Nutrition handout(s) given to patient.    Expected Outcomes Short Term Goal: Understand basic principles of dietary content, such as calories, fat, sodium, cholesterol and  nutrients.;Short Term Goal: A plan has been developed with personal nutrition goals set during dietitian appointment.;Long Term Goal: Adherence to prescribed nutrition plan.            Goals reviewed with patient; copy given to patient.

## 2021-03-28 ENCOUNTER — Other Ambulatory Visit: Payer: Self-pay

## 2021-03-28 DIAGNOSIS — Z951 Presence of aortocoronary bypass graft: Secondary | ICD-10-CM

## 2021-03-28 NOTE — Progress Notes (Signed)
Discharge Progress Report  Patient Details  Name: [name] MRN: [id number] Date of Birth: 1951-04-29 Referring Provider:   Flowsheet Row Cardiac Rehab from 12/31/2020 in [facility] Cardiac [facility] Pul[facility]  Referring Provider [name] MD        Number of Visits: 36  Reason for Discharge:  Patient reached a stable level of exercise. Patient independent in their exercise. Patient has met program and personal goals.  Smoking History:  Social History   Tobacco Use  Smoking Status Former   Packs/day: 1.50   Years: 40.00   Pack years: 60.00   Types: Cigarettes   Quit date: 09/14/2007   Years since quitting: 13.5  Smokeless Tobacco Never  Tobacco Comments   quit 2009    Diagnosis:  S/P CABG x 5  ADL UCSD:   Initial Exercise Prescription:  Initial Exercise Prescription - 12/31/20 1100       Date of Initial Exercise RX and Referring Provider   Date 12/31/20    Referring Provider [name] MD      Treadmill   MPH 2.7    Grade 1    Minutes 15    METs 3.44      NuStep   Level 3    SPM 80    Minutes 15    METs 3.3      Elliptical   Level 1    Speed 3    Minutes 15    METs 3      Prescription Details   Frequency (times per week) 2    Duration Progress to 30 minutes of continuous aerobic without signs/symptoms of physical distress      Intensity   THRR 40-80% of Max Heartrate 110-137    Ratings of Perceived Exertion 11-13    Perceived Dyspnea 0-4      Progression   Progression Continue to progress workloads to maintain intensity without signs/symptoms of physical distress.      Resistance Training   Training Prescription Yes    Weight 4 lb    Reps 10-15             Discharge Exercise Prescription (Final Exercise Prescription Changes):  Exercise Prescription Changes - 03/20/21 1400       Response to Exercise   Blood Pressure (Admit) 112/58    Blood Pressure (Exit) 104/54    Heart Rate (Admit) 94 bpm    Heart Rate  (Exercise) 129 bpm    Heart Rate (Exit) 113 bpm    Oxygen Saturation (Admit) 95 %    Oxygen Saturation (Exercise) 94 %    Oxygen Saturation (Exit) 95 %    Rating of Perceived Exertion (Exercise) 14    Symptoms none    Duration Continue with 30 min of aerobic exercise without signs/symptoms of physical distress.    Intensity THRR unchanged      Progression   Progression Continue to progress workloads to maintain intensity without signs/symptoms of physical distress.    Average METs 5      Resistance Training   Training Prescription Yes    Weight 10 lb    Reps 10-15      Interval Training   Interval Training No      Treadmill   MPH 3.6    Grade 2.5   intervals   Minutes 15    METs 5      NuStep   Level 5    Minutes 15      Home Exercise Plan  [facility]   [employer]   Frequency Add 3 additional days to program exercise sessions.    Initial Home Exercises Provided 01/10/21             Functional Capacity:  6 Minute Walk     Row Name 12/31/20 1154 03/07/21 0934       6 Minute Walk   Phase Initial Discharge    Distance 1445 feet 1805 feet    Distance % Change -- 24.9 %    Distance Feet Change -- 360 ft    Walk Time 6 minutes 6 minutes    # of Rest Breaks 0 0    MPH 2.74 3.42    METS 3.33 4.26    RPE 9 14    Perceived Dyspnea  0 0    VO2 Peak 11.67 14.89    Symptoms Yes (comment) No    Comments chronic left hip pain 2/10 --    Resting HR 83 bpm 89 bpm    Resting BP 126/74 124/70    Resting Oxygen Saturation  97 % 98 %    Exercise Oxygen Saturation  during 6 min walk 98 % 98 %    Max Ex. HR 107 bpm 138 bpm    Max Ex. BP 142/70 138/70    2 Minute Post BP 122/66 --             Psychological, QOL, Others - Outcomes: PHQ 2/9: Depression screen PHQ 2/9 03/12/2021 12/31/2020 06/06/2020 05/29/2020 10/18/2019  Decreased Interest 0 0 0 0 0  Down, Depressed, Hopeless 0 0 0 0 0  PHQ - 2 Score 0 0 0 0 0   Altered sleeping 0 0 - - -  Tired, decreased energy 0 2 - - -  Change in appetite 0 1 - - -  Feeling bad or failure about yourself  0 0 - - -  Trouble concentrating 0 0 - - -  Moving slowly or fidgety/restless 0 0 - - -  Suicidal thoughts 0 0 - - -  PHQ-9 Score 0 3 - - -  Difficult doing work/chores Not difficult at all Not difficult at all - - -  Some recent data might be hidden    Quality of Life:  Quality of Life - 03/12/21 0937       Quality of Life Scores   Health/Function Pre 21.47 %    Health/Function Post 27.1 %    Health/Function % Change 26.22 %    Socioeconomic Pre 26.29 %    Socioeconomic Post 28 %    Socioeconomic % Change  6.5 %    Psych/Spiritual Pre 24.07 %    Psych/Spiritual Post 25.29 %    Psych/Spiritual % Change 5.07 %    Family Pre 22.4 %    Family Post 26.4 %    Family % Change 17.86 %    GLOBAL Pre 23.13 %    GLOBAL Post 26.84 %    GLOBAL % Change 16.04 %              Nutrition & Weight - Outcomes:  Pre Biometrics - 12/31/20 1158       Pre Biometrics   Height 5' 8.6" (1.742 m)    Weight 184 lb (83.5 kg)    BMI (Calculated) 27.5    Single Leg Stand 17.5 seconds             Post Biometrics - 03/07/21 0935          Post  Biometrics   Height 5' 8.6" (1.742 m)    Weight 183 lb (83 kg)    BMI (Calculated) 27.35    Single Leg Stand 26.2 seconds             Nutrition:  Nutrition Therapy & Goals - 01/07/21 0800       Nutrition Therapy   Diet Heart healthy, low Na, diabetes friendly    Drug/Food Interactions Statins/Certain Fruits    Protein (specify units) 65g    Fiber 30 grams    Whole Grain Foods 3 servings    Saturated Fats 12 max. grams    Fruits and Vegetables 8 servings/day    Sodium 1.5 grams      Personal Nutrition Goals   Nutrition Goal ST: Continue with ST goal made to reduce added sugar, practice CHO counting, pair CHO foods with protien and fat - ex: fruit with nuts LT: manage carbohydrates (choose complex  CHO most often, 2-3 CHO for meals, 1-2 CHO for snacks, pair CHO with protein and/or fat)    Comments He has seen a nutritionist previously and he has been slowly incorporating her tips. PYP score 72.  T2DM, no insulin. He feels he eats too mnay carbs, but he is monitoring sugar - he is not eating so many sweets. He would then like to start working on his carbs. Varied diet. B: crsipy oat squares with strawberries and blueberries and banana and 2% milk L: can have leftovers - his wife likes to eat out more than him: 1-2x/week goes to [employer] to eat (does not want to change this). D: cooks: tonight flounder and hushpuppy mix. He prefers fresh or frozen vegetables with his meals. S: red pears and other fruit during the day Drinks: water and sometimes cheerwine Reviewed heart healthy eating and diabetes friendly eating.      Intervention Plan   Intervention Prescribe, educate and counsel regarding individualized specific dietary modifications aiming towards targeted core components such as weight, hypertension, lipid management, diabetes, heart failure and other comorbidities.;Nutrition handout(s) given to patient.    Expected Outcomes Short Term Goal: Understand basic principles of dietary content, such as calories, fat, sodium, cholesterol and nutrients.;Short Term Goal: A plan has been developed with personal nutrition goals set during dietitian appointment.;Long Term Goal: Adherence to prescribed nutrition plan.             Nutrition Discharge:   Education Questionnaire Score:  Knowledge Questionnaire Score - 03/12/21 0938       Knowledge Questionnaire Score   Pre Score 24/26 Education Focus: Smoking, Nutrition    Post Score 25/26             Goals reviewed with patient; copy given to patient. 

## 2021-03-28 NOTE — Progress Notes (Signed)
Daily Session Note  Patient Details  Name: [name] MRN: [id number] Date of Birth: 1950/11/09 Referring Provider:   Flowsheet Row Cardiac Rehab from 12/31/2020 in [facility] Cardiac and Pulmonary Rehab  Referring Provider [name] MD       Encounter Date: 03/28/2021  Check In:  Session Check In - 03/28/21 0926       Check-In   Supervising physician immediately available to respond to emergencies See telemetry face sheet for immediately available ER MD    Location [facility]-Cardiac & Pulmonary Rehab    Staff Present Birdie Sons, MPA, Nino Glow, MS, ASCM CEP, Exercise Physiologist;Amanda Oletta Darter, BA, ACSM CEP, Exercise Physiologist;Ersa Delaney Amedeo Plenty, BS, ACSM CEP, Exercise Physiologist    Virtual Visit No    Medication changes reported     No    Fall or balance concerns reported    No    Tobacco Cessation No Change    Warm-up and Cool-down Performed on first and last piece of equipment    Resistance Training Performed Yes    VAD Patient? No    PAD/SET Patient? No      Pain Assessment   Currently in Pain? No/denies                Social History   Tobacco Use  Smoking Status Former   Packs/day: 1.50   Years: 40.00   Pack years: 60.00   Types: Cigarettes   Quit date: 09/14/2007   Years since quitting: 13.5  Smokeless Tobacco Never  Tobacco Comments   quit 2009    Goals Met:  Independence with exercise equipment Exercise tolerated well No report of concerns or symptoms today Strength training completed today  Goals Unmet:  Not Applicable  Comments:  [name] graduated today from  rehab with 36 sessions completed.  Details of the patient's exercise prescription and what He needs to do in order to continue the prescription and progress were discussed with patient.  Patient was given a copy of prescription and goals.  Patient verbalized understanding.  [name] plans to continue to exercise by going to [employer].    Dr. Emily Filbert is Medical  Director for [facility].  Dr. Ottie Glazier is Medical Director for [facility] Pulmonary Rehabilitation.

## 2021-03-28 NOTE — Progress Notes (Signed)
Cardiac Individual Treatment Plan  Patient Details  Name: [name] MRN: [id number] Date of Birth: 1951-03-06 Referring Provider:   Flowsheet Row Cardiac Rehab from 12/31/2020 in [facility] Cardiac and Pulmonary Rehab  Referring Provider [name] MD       Initial Encounter Date:  Flowsheet Row Cardiac Rehab from 12/31/2020 in [facility] Cardiac and Pulmonary Rehab  Date 12/31/20       Visit Diagnosis: S/P CABG x 5  Patient's Home Medications on Admission:  Current Outpatient Medications:    aspirin EC 325 MG EC tablet, Take 1 tablet (325 mg total) by mouth daily., Disp: 30 tablet, Rfl: 0   atorvastatin (LIPITOR) 80 MG tablet, Take 1 tablet (80 mg total) by mouth daily., Disp: 90 tablet, Rfl: 3   carvedilol (COREG) 6.25 MG tablet, Take 1 tablet (6.25 mg total) by mouth 2 (two) times daily with a meal., Disp: 180 tablet, Rfl: 3   diphenhydramine-acetaminophen (TYLENOL PM) 25-500 MG TABS tablet, Take 2 tablets by mouth at bedtime., Disp: , Rfl:    Dulaglutide (TRULICITY) 9.47 SJ/6.2EZ SOPN, Inject 0.75 mg into the skin once a week., Disp: 6 mL, Rfl: 1   ezetimibe (ZETIA) 10 MG tablet, Take 1 tablet (10 mg total) by mouth daily., Disp: 90 tablet, Rfl: 3   furosemide (LASIX) 40 MG tablet, Take 1 tablet (40 mg total) by mouth daily., Disp: 90 tablet, Rfl: 3   isosorbide mononitrate (IMDUR) 30 MG 24 hr tablet, Take 1 tablet (30 mg total) by mouth daily., Disp: 90 tablet, Rfl: 3   JARDIANCE 25 MG TABS tablet, TAKE 1 TABLET DAILY, Disp: 90 tablet, Rfl: 3   losartan (COZAAR) 25 MG tablet, Take 1 tablet (25 mg total) by mouth daily., Disp: 90 tablet, Rfl: 3   metFORMIN (GLUCOPHAGE) 1000 MG tablet, TAKE 1 TABLET TWICE A DAY WITH A MEAL, Disp: 180 tablet, Rfl: 3   Multiple Vitamins-Minerals (MULTIVITAMINS THER. W/MINERALS) TABS, Take 1 tablet by mouth every morning., Disp: , Rfl:    niacin (NIASPAN) 1000 MG CR tablet, TAKE 2 TABLETS DAILY AT BEDTIME, Disp: 180 tablet, Rfl: 2   potassium  chloride SA (KLOR-CON) 20 MEQ tablet, Take 1 tablet (20 mEq total) by mouth daily., Disp: 90 tablet, Rfl: 3  Past Medical History: Past Medical History:  Diagnosis Date   Angina    Asthma    BCC (basal cell carcinoma of skin) 09/06/2020   left upper back lateral (EDC) ,  left upper arm anterior (EDC), left upper back medial (EDC)    Bursitis of left elbow 2018   Diabetes mellitus without complication ([place])    ED (erectile dysfunction)    HTN (hypertension) 04/12/2011   Hyperlipemia 04/12/2011   Ischemic cardiomyopathy    a. echo 2010: EF 45-50%, mild to mod ant and apical wall HK, trace MR; b. cardiac cath 06/2012: EF 40%, mild MR    Multiple vessel coronary artery disease 04/12/2011   a. remote MI 1996; b. MI 1997 s/p PCI - LAD & diag; c. MI 2009: long stenting P-MLAD & Diag & POBA of jailed diag branch; d. cath 2010: 80% ISR of LAD w/ L-R collats, med Rx; e. cath 2012: 50-60% tub mLAD, 60-70% dLAD, FFR 0.75 -->s/p PCI dissection => w/ 2 stents (3 total); f. cath 06/2012: no changes from 2012 cath, med Rx, patent stents -> MV CAD 11/20/20 --> CABG X 5   Myocardial infarction (HCC)    x 5   Obesity    OSA (obstructive sleep  apnea)    on CPAP   Osteoarthritis    Renal disorder    kidney stone    Tobacco Use: Social History   Tobacco Use  Smoking Status Former   Packs/day: 1.50   Years: 40.00   Pack years: 60.00   Types: Cigarettes   Quit date: 09/14/2007   Years since quitting: 13.5  Smokeless Tobacco Never  Tobacco Comments   quit 2009    Labs: Recent Review Flowsheet Data     Labs for ITP Cardiac and Pulmonary Rehab Latest Ref Rng & Units 11/20/2020 11/20/2020 11/20/2020 11/20/2020 03/20/2021   Cholestrol 0 - 200 mg/dL - - - - -   LDLCALC 0 - 99 mg/dL - - - - -   LDLDIRECT mg/dL - - - - -   HDL >40 mg/dL - - - - -   Trlycerides <150 mg/dL - - - - -   Hemoglobin A1c 4.6 - 6.5 % - - - - 11.2(H)   PHART 7.350 - 7.450 7.331(L) 7.335(L) 7.319(L) 7.343(L) -   PCO2ART 32.0 -  48.0 mmHg 42.9 40.0 31.8(L) 40.1 -   HCO3 20.0 - 28.0 mmol/L 23.0 21.3 16.4(L) 21.8 -   TCO2 22 - 32 mmol/L 24 23 17(L) 23 -   ACIDBASEDEF 0.0 - 2.0 mmol/L 3.0(H) 4.0(H) 9.0(H) 4.0(H) -   O2SAT % 96.0 99.0 99.0 95.0 -        Exercise Target Goals: Exercise Program Goal: Individual exercise prescription set using results from initial 6 min walk test and THRR while considering  patient's activity barriers and safety.   Exercise Prescription Goal: Initial exercise prescription builds to 30-45 minutes a day of aerobic activity, 2-3 days per week.  Home exercise guidelines will be given to patient during program as part of exercise prescription that the participant will acknowledge.   Education: Aerobic Exercise: - Group verbal and visual presentation on the components of exercise prescription. Introduces F.I.T.T principle from ACSM for exercise prescriptions.  Reviews F.I.T.T. principles of aerobic exercise including progression. Written material given at graduation. Flowsheet Row Cardiac Rehab from 02/28/2021 in [facility] Cardiac and Pulmonary Rehab  Date 01/03/21  Educator [facility]  Instruction Review Code 1- Verbalizes Understanding       Education: Resistance Exercise: - Group verbal and visual presentation on the components of exercise prescription. Introduces F.I.T.T principle from ACSM for exercise prescriptions  Reviews F.I.T.T. principles of resistance exercise including progression. Written material given at graduation. Flowsheet Row Cardiac Rehab from 02/28/2021 in [facility] Cardiac and Pulmonary Rehab  Date 01/10/21  Educator [employer]  Instruction Review Code 1- Verbalizes Understanding        Education: Exercise & Equipment Safety: - Individual verbal instruction and demonstration of equipment use and safety with use of the equipment. Flowsheet Row Cardiac Rehab from 02/28/2021 in [facility] Cardiac and Pulmonary Rehab  Date 12/26/20  Educator [facility]  Instruction Review Code 1- Verbalizes  Understanding       Education: Exercise Physiology & General Exercise Guidelines: - Group verbal and written instruction with models to review the exercise physiology of the cardiovascular system and associated critical values. Provides general exercise guidelines with specific guidelines to those with heart or lung disease.  Flowsheet Row Cardiac Rehab from 02/28/2021 in [facility] Cardiac and Pulmonary Rehab  Date 02/28/21  Educator [facility]  Instruction Review Code 1- Verbalizes Understanding       Education: Flexibility, Balance, Mind/Body Relaxation: - Group verbal and visual presentation with interactive activity on the components of exercise prescription.  Introduces F.I.T.T principle from ACSM for exercise prescriptions. Reviews F.I.T.T. principles of flexibility and balance exercise training including progression. Also discusses the mind body connection.  Reviews various relaxation techniques to help reduce and manage stress (i.e. Deep breathing, progressive muscle relaxation, and visualization). Balance handout provided to take home. Written material given at graduation. Flowsheet Row Cardiac Rehab from 02/28/2021 in [facility] Cardiac and Pulmonary Rehab  Date 01/17/21  Educator [facility]  Instruction Review Code 1- Verbalizes Understanding       Activity Barriers & Risk Stratification:  Activity Barriers & Cardiac Risk Stratification - 12/31/20 1155       Activity Barriers & Cardiac Risk Stratification   Activity Barriers Deconditioning;Muscular Weakness;Joint Problems;History of Falls;Other (comment);Incisional Pain    Comments occasional left hip pain, vein grafts still painful at times    Cardiac Risk Stratification High             6 Minute Walk:  6 Minute Walk     Row Name 12/31/20 1154 03/07/21 0934       6 Minute Walk   Phase Initial Discharge    Distance 1445 feet 1805 feet    Distance % Change -- 24.9 %    Distance Feet Change -- 360 ft    Walk Time 6 minutes 6 minutes     # of Rest Breaks 0 0    MPH 2.74 3.42    METS 3.33 4.26    RPE 9 14    Perceived Dyspnea  0 0    VO2 Peak 11.67 14.89    Symptoms Yes (comment) No    Comments chronic left hip pain 2/10 --    Resting HR 83 bpm 89 bpm    Resting BP 126/74 124/70    Resting Oxygen Saturation  97 % 98 %    Exercise Oxygen Saturation  during 6 min walk 98 % 98 %    Max Ex. HR 107 bpm 138 bpm    Max Ex. BP 142/70 138/70    2 Minute Post BP 122/66 --             Oxygen Initial Assessment:   Oxygen Re-Evaluation:   Oxygen Discharge (Final Oxygen Re-Evaluation):   Initial Exercise Prescription:  Initial Exercise Prescription - 12/31/20 1100       Date of Initial Exercise RX and Referring Provider   Date 12/31/20    Referring Provider [name] MD      Treadmill   MPH 2.7    Grade 1    Minutes 15    METs 3.44      NuStep   Level 3    SPM 80    Minutes 15    METs 3.3      Elliptical   Level 1    Speed 3    Minutes 15    METs 3      Prescription Details   Frequency (times per week) 2    Duration Progress to 30 minutes of continuous aerobic without signs/symptoms of physical distress      Intensity   THRR 40-80% of Max Heartrate 110-137    Ratings of Perceived Exertion 11-13    Perceived Dyspnea 0-4      Progression   Progression Continue to progress workloads to maintain intensity without signs/symptoms of physical distress.      Resistance Training   Training Prescription Yes    Weight 4 lb    Reps 10-15  Perform Capillary Blood Glucose checks as needed.  Exercise Prescription Changes:   Exercise Prescription Changes     Row Name 12/31/20 1100 01/07/21 1200 01/10/21 0900 01/23/21 1100 02/04/21 1000     Response to Exercise   Blood Pressure (Admit) 126/74 122/70 -- 124/64 114/56   Blood Pressure (Exercise) 142/70 132/72 -- 146/68 --   Blood Pressure (Exit) 122/66 126/64 -- 118/64 114/64   Heart Rate (Admit) 83 bpm 94 bpm -- 78 bpm 89  bpm   Heart Rate (Exercise) 107 bpm 122 bpm -- 124 bpm 127 bpm   Heart Rate (Exit) 68 bpm 104 bpm -- 90 bpm 106 bpm   Oxygen Saturation (Admit) 97 % -- -- -- --   Oxygen Saturation (Exercise) 98 % -- -- -- --   Rating of Perceived Exertion (Exercise) 9 12 -- 15 13   Perceived Dyspnea (Exercise) 0 -- -- -- --   Symptoms left hip pain 2/10 none -- none none   Comments walk test results second full day of exercise -- -- --   Duration -- Progress to 30 minutes of  aerobic without signs/symptoms of physical distress -- Continue with 30 min of aerobic exercise without signs/symptoms of physical distress. Continue with 30 min of aerobic exercise without signs/symptoms of physical distress.   Intensity -- THRR unchanged -- THRR unchanged THRR unchanged     Progression   Progression -- Continue to progress workloads to maintain intensity without signs/symptoms of physical distress. -- Continue to progress workloads to maintain intensity without signs/symptoms of physical distress. Continue to progress workloads to maintain intensity without signs/symptoms of physical distress.   Average METs -- 2.97 -- 4.6 3.83     Resistance Training   Training Prescription -- Yes -- Yes Yes   Weight -- 4 lb -- 4 lb 4 lb   Reps -- 10-15 -- 10-15 10-15     Interval Training   Interval Training -- No -- No No     Treadmill   MPH -- 3 -- 3.4 3.5   Grade -- 1.5 -- 1.5 1.5   Minutes -- 15 -- 15 15   METs -- 3.92 -- 4.31 4.4     NuStep   Level -- 3 -- 4 4   Minutes -- 15 -- 15 15   METs -- 2.9 -- 4.9 4.8     Recumbant Elliptical   Level -- -- -- -- 3.2   Minutes -- -- -- -- 15   METs -- -- -- -- 2.1     Elliptical   Level -- 1 -- -- --   Minutes -- 15 -- -- --   METs -- 2.1 -- -- --     Home Exercise Plan   Plans to continue exercise at -- -- [employer] (comment)  [employer] (comment)  [employer] (comment)  [employer]   Frequency -- -- Add 3  additional days to program exercise sessions. Add 3 additional days to program exercise sessions. Add 3 additional days to program exercise sessions.   Initial Home Exercises Provided -- -- 01/10/21 01/10/21 01/10/21    Row Name 02/19/21 1400 03/04/21 1500 03/20/21 1400         Response to Exercise   Blood Pressure (Admit) 108/60 132/82 112/58     Blood Pressure (Exit) 108/68 110/62 104/54     Heart Rate (Admit) 89 bpm 107 bpm 94 bpm     Heart Rate (Exercise) 114 bpm 145  bpm 129 bpm     Heart Rate (Exit) 100 bpm 110 bpm 113 bpm     Oxygen Saturation (Admit) -- 96 % 95 %     Oxygen Saturation (Exercise) -- 94 % 94 %     Oxygen Saturation (Exit) -- 94 % 95 %     Rating of Perceived Exertion (Exercise) _0 Perceived Dyspnea (Exercise) -- 1 --     Symptoms none none none     Duration Continue with 30 min of aerobic exercise without signs/symptoms of physical distress. Continue with 30 min of aerobic exercise without signs/symptoms of physical distress. Continue with 30 min of aerobic exercise without signs/symptoms of physical distress.     Intensity THRR unchanged THRR unchanged THRR unchanged       Progression   Progression Continue to progress workloads to maintain intensity without signs/symptoms of physical distress. Continue to progress workloads to maintain intensity without signs/symptoms of physical distress. Continue to progress workloads to maintain intensity without signs/symptoms of physical distress.     Average METs 8.7 8.53 5       Resistance Training   Training Prescription Yes Yes Yes     Weight 4 lb 4 lb 10 lb     Reps 10-15 10-15 10-15       Interval Training   Interval Training No No No       Treadmill   MPH 3.7 5.6 3.6     Grade 2.5 2 2.5  intervals     Minutes _1 METs 5.11 11.5 5       NuStep   Level _2 Minutes 15 115 15     METs 12.3 8.7 --       Recumbant Elliptical   Level -- 3 --     Minutes -- 15 --     METs -- 5.4 --        Home Exercise Plan   Plans to continue exercise at [employer] (comment)  [employer] (comment)  [employer] (comment)  [employer]     Frequency Add 3 additional days to program exercise sessions. Add 3 additional days to program exercise sessions. Add 3 additional days to program exercise sessions.     Initial Home Exercises Provided 01/10/21 01/10/21 01/10/21       Oxygen   Maintain Oxygen Saturation -- 88% or higher --              Exercise Comments:   Exercise Comments     Row Name 01/01/21 6629 03/28/21 0927         Exercise Comments First full day of exercise!  Patient was oriented to gym and equipment including functions, settings, policies, and procedures.  Patient's individual exercise prescription and treatment plan were reviewed.  All starting workloads were established based on the results of the 6 minute walk test done at initial orientation visit.  The plan for exercise progression was also introduced and progression will be customized based on patient's performance and goals. [name] graduated today from  rehab with 36 sessions completed.  Details of the patient's exercise prescription and what He needs to do in order to continue the prescription and progress were discussed with patient.  Patient was given a copy of prescription and goals.  Patient verbalized understanding.  [name] plans to continue to exercise by going to [employer].  Exercise Goals and Review:   Exercise Goals     Row Name 12/31/20 1158             Exercise Goals   Increase Physical Activity Yes       Intervention Provide advice, education, support and counseling about physical activity/exercise needs.;Develop an individualized exercise prescription for aerobic and resistive training based on initial evaluation findings, risk stratification, comorbidities and participant's personal goals.       Expected Outcomes  Short Term: Attend rehab on a regular basis to increase amount of physical activity.;Long Term: Add in home exercise to make exercise part of routine and to increase amount of physical activity.;Long Term: Exercising regularly at least 3-5 days a week.       Increase Strength and Stamina Yes       Intervention Provide advice, education, support and counseling about physical activity/exercise needs.;Develop an individualized exercise prescription for aerobic and resistive training based on initial evaluation findings, risk stratification, comorbidities and participant's personal goals.       Expected Outcomes Short Term: Increase workloads from initial exercise prescription for resistance, speed, and METs.;Short Term: Perform resistance training exercises routinely during rehab and add in resistance training at home;Long Term: Improve cardiorespiratory fitness, muscular endurance and strength as measured by increased METs and functional capacity (6MWT)       Able to understand and use rate of perceived exertion (RPE) scale Yes       Intervention Provide education and explanation on how to use RPE scale       Expected Outcomes Short Term: Able to use RPE daily in rehab to express subjective intensity level;Long Term:  Able to use RPE to guide intensity level when exercising independently       Able to understand and use Dyspnea scale Yes       Intervention Provide education and explanation on how to use Dyspnea scale       Expected Outcomes Short Term: Able to use Dyspnea scale daily in rehab to express subjective sense of shortness of breath during exertion;Long Term: Able to use Dyspnea scale to guide intensity level when exercising independently       Knowledge and understanding of Target Heart Rate Range (THRR) Yes       Intervention Provide education and explanation of THRR including how the numbers were predicted and where they are located for reference       Expected Outcomes Short Term: Able to  state/look up THRR;Short Term: Able to use daily as guideline for intensity in rehab;Long Term: Able to use THRR to govern intensity when exercising independently       Able to check pulse independently Yes       Intervention Provide education and demonstration on how to check pulse in carotid and radial arteries.;Review the importance of being able to check your own pulse for safety during independent exercise       Expected Outcomes Short Term: Able to explain why pulse checking is important during independent exercise;Long Term: Able to check pulse independently and accurately       Understanding of Exercise Prescription Yes       Intervention Provide education, explanation, and written materials on patient's individual exercise prescription       Expected Outcomes Short Term: Able to explain program exercise prescription;Long Term: Able to explain home exercise prescription to exercise independently                Exercise Goals Re-Evaluation :  Exercise Goals Re-Evaluation     Row Name 01/01/21 [phone] 01/07/21 1208 01/10/21 0956 01/22/21 0936 02/04/21 1105     Exercise Goal Re-Evaluation   Exercise Goals Review Increase Physical Activity;Able to understand and use rate of perceived exertion (RPE) scale;Knowledge and understanding of Target Heart Rate Range (THRR);Understanding of Exercise Prescription;Able to understand and use Dyspnea scale;Able to check pulse independently;Increase Strength and Stamina Increase Physical Activity;Increase Strength and Stamina Increase Physical Activity;Increase Strength and Stamina;Understanding of Exercise Prescription Increase Physical Activity;Increase Strength and Stamina Increase Physical Activity;Increase Strength and Stamina   Comments Reviewed RPE and dyspnea scales, THR and program prescription with pt today.  Pt voiced understanding and was given a copy of goals to take home. [name] is doing well the first couple sessions that he has been here. He  has already increased his treadmill speed and incline to 3/ 1.5%. We will continue to increase his workloads as he progresses throughout the program. Will continue to monitor. Reviewed home exercise with pt today.  Pt plans to walk and go to [employer] for exercise.  He is also planning to do some weights on his own at home too. Reviewed THR, pulse, RPE, sign and symptoms, pulse oximetery and when to call 911 or MD.  Also discussed weather considerations and indoor options.  Pt voiced understanding. [name] has started doing aerobic and balance exercises last week. He is not checking HR  outside sessions. He wants to be able to ride his motorcycle here.  His Dr said it may take 3 months to get back on his bike and it has been over 2.  He wants to try riding soon.  This staff recommended starting with short rides first. [name] is doing well in rehab. He increased his loads on the treadmill to 3.5 speed and 1.5% incline. Will continue to monitor his progression.   Expected Outcomes Short: Use RPE daily to regulate intensity. Long: Follow program prescription in THR. Short: Increase workloads as tolerated Long: Continue to increase overall MET level Short: Return to [employer] on off days Long: Continue to improve stamina Short: continue to work on balance and check HR while exercising Long: get back to riding motorcycle Short: Continue building up tolerance on treadmill Long: Continue to increase overall MET level    Row Name 02/19/21 1451 02/26/21 0932 03/04/21 1541 03/20/21 1404 03/21/21 0925     Exercise Goal Re-Evaluation   Exercise Goals Review Increase Physical Activity;Increase Strength and Stamina Increase Physical Activity;Increase Strength and Stamina;Understanding of Exercise Prescription Increase Physical Activity;Increase Strength and Stamina;Understanding of Exercise Prescription Increase Physical Activity;Increase Strength and Stamina Increase Physical Activity;Increase Strength and Stamina    Comments Handsome continues to progress well and has added interval training, which has increased his average MET level.  We will continue to monitor progress. [name] is doing well in rehab.  He is walking and using 8 lb weights at home on his off days.  He is planning to join [employer] with his wife to keep it up.  He will usually go between 2.6-2.9 miles and then plans to increase from there.  He aims for an hour of walking.  His strength and stamina have improved. [name] continues to do well in rehab.  He is doing intervals on both treadmill and NuStep.  He has worked his way up to 5.6 mph. We will continue to monitor his progress. [name] improved post walk by 25%!  He plans to attend [employer] after he completes [facility].  [name] has done well in rehab. He is planning to go to [employer] after rehab.  His hip has started to agrravate him again and he is going to get an appointment with his doctor. He has noticed that he needs to work on balance and has plans to continue to work on it.   Expected Outcomes Short: continue interval training  Long: continue to improve MET level Short: Continue to walk on off days Long: Continue to improve stamina. Short: Continue to use intervals Long: Continue to improve stamina ST/LT: maintain exercise on his own Continue to exercise independently            Discharge Exercise Prescription (Final Exercise Prescription Changes):  Exercise Prescription Changes - 03/20/21 1400       Response to Exercise   Blood Pressure (Admit) 112/58    Blood Pressure (Exit) 104/54    Heart Rate (Admit) 94 bpm    Heart Rate (Exercise) 129 bpm    Heart Rate (Exit) 113 bpm    Oxygen Saturation (Admit) 95 %    Oxygen Saturation (Exercise) 94 %    Oxygen Saturation (Exit) 95 %    Rating of Perceived Exertion (Exercise) 14    Symptoms none    Duration Continue with 30 min of aerobic exercise without signs/symptoms of physical distress.    Intensity THRR  unchanged      Progression   Progression Continue to progress workloads to maintain intensity without signs/symptoms of physical distress.    Average METs 5      Resistance Training   Training Prescription Yes    Weight 10 lb    Reps 10-15      Interval Training   Interval Training No      Treadmill   MPH 3.6    Grade 2.5   intervals   Minutes 15    METs 5      NuStep   Level 5    Minutes 15      Home Exercise Plan   Plans to continue exercise at [employer] (comment)   [employer]   Frequency Add 3 additional days to program exercise sessions.    Initial Home Exercises Provided 01/10/21             Nutrition:  Target Goals: Understanding of nutrition guidelines, daily intake of sodium <1524m, cholesterol <2046m calories 30% from fat and 7% or less from saturated fats, daily to have 5 or more servings of fruits and vegetables.  Education: All About Nutrition: -Group instruction provided by verbal, written material, interactive activities, discussions, models, and posters to present general guidelines for heart healthy nutrition including fat, fiber, MyPlate, the role of sodium in heart healthy nutrition, utilization of the nutrition label, and utilization of this knowledge for meal planning. Follow up email sent as well. Written material given at graduation. Flow[facility]  Education need identified 12/31/20  Date 01/31/21  Educator MCHomecroftInstruction Review Code 1- Verbalizes Understanding       Biometrics:  Pre Biometrics - 12/31/20 1158       Pre Biometrics   Height 5' 8.6" (1.742 m)    Weight 184 lb (83.5 kg)    BMI (Calculated) 27.5    Single Leg Stand 17.5 seconds             Post Biometrics - 03/07/21 0935        Post  Biometrics   Height  5' 8.6" (1.742 m)    Weight 183 lb (83 kg)    BMI (Calculated) 27.35    Single Leg Stand 26.2 seconds              Nutrition Therapy Plan and Nutrition Goals:  Nutrition Therapy & Goals - 01/07/21 0800       Nutrition Therapy   Diet Heart healthy, low Na, diabetes friendly    Drug/Food Interactions Statins/Certain Fruits    Protein (specify units) 65g    Fiber 30 grams    Whole Grain Foods 3 servings    Saturated Fats 12 max. grams    Fruits and Vegetables 8 servings/day    Sodium 1.5 grams      Personal Nutrition Goals   Nutrition Goal ST: Continue with ST goal made to reduce added sugar, practice CHO counting, pair CHO foods with protien and fat - ex: fruit with nuts LT: manage carbohydrates (choose complex CHO most often, 2-3 CHO for meals, 1-2 CHO for snacks, pair CHO with protein and/or fat)    Comments He has seen a nutritionist previously and he has been slowly incorporating her tips. PYP score 72.  T2DM, no insulin. He feels he eats too mnay carbs, but he is monitoring sugar - he is not eating so many sweets. He would then like to start working on his carbs. Varied diet. B: crsipy oat squares with strawberries and blueberries and banana and 2% milk L: can have leftovers - his wife likes to eat out more than him: 1-2x/week goes to [employer] to eat (does not want to change this). D: cooks: tonight flounder and hushpuppy mix. He prefers fresh or frozen vegetables with his meals. S: red pears and other fruit during the day Drinks: water and sometimes cheerwine Reviewed heart healthy eating and diabetes friendly eating.      Intervention Plan   Intervention Prescribe, educate and counsel regarding individualized specific dietary modifications aiming towards targeted core components such as weight, hypertension, lipid management, diabetes, heart failure and other comorbidities.;Nutrition handout(s) given to patient.    Expected Outcomes Short Term Goal: Understand basic principles of dietary content, such as calories, fat, sodium, cholesterol and nutrients.;Short Term Goal: A plan has been  developed with personal nutrition goals set during dietitian appointment.;Long Term Goal: Adherence to prescribed nutrition plan.             Nutrition Assessments:  MEDIFICTS Score Key: ?70 Need to make dietary changes  40-70 Heart Healthy Diet ? 40 Therapeutic Level Cholesterol Diet  Flowsheet Row Cardiac Rehab from 03/12/2021 in [facility] Cardiac and Pulmonary Rehab  Picture Your Plate Total Score on Admission 72  Picture Your Plate Total Score on Discharge 67      Picture Your Plate Scores: <97 Unhealthy dietary pattern with much room for improvement. 41-50 Dietary pattern unlikely to meet recommendations for good health and room for improvement. 51-60 More healthful dietary pattern, with some room for improvement.  >60 Healthy dietary pattern, although there may be some specific behaviors that could be improved.    Nutrition Goals Re-Evaluation:  Nutrition Goals Re-Evaluation     [name] Name 01/22/21 0940 02/26/21 0938 03/21/21 0930         Goals   Nutrition Goal -- ST: Continue with ST goal made to reduce added sugar, practice CHO counting, pair CHO foods with protien and fat - ex: fruit with nuts LT: manage carbohydrates (choose complex CHO most often, 2-3 CHO for meals, 1-2 CHO for snacks,  pair CHO with protein and/or fat) Short:  focus on healthy choices Long: maintain heart healthy diet     Comment [name] says he hasnt really been counting carbs yet.  He states he does remember how to read a food label.  he does only eat whole grain bread etc.  He also likes beans. [name] continues to do well with his diet.  He is building muscle and maintaining his weight.  He is doing well with his protein. He is not card counting as much as looking at end results.  He is focused on balanced eating and gets lots of vegetables.  He is also eating more frequent small meals. 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  He is continuing to do well with protein and trying to eat  better.  He has now started on trulicity to help.     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  He is continuing to do well with protein and trying to eat better.  He has now started on trulicity to help.    Expected Outcome Short:  focus on healthy choices Long: maintain heart healthy diet             Psychosocial: Target Goals: Acknowledge presence or absence of significant depression and/or stress, maximize coping skills, provide positive support system. Participant is able to verbalize types and ability to use techniques and skills needed for reducing stress and depression.   Education: Stress, Anxiety, and Depression - Group verbal and visual presentation to define topics covered.  Reviews how body is impacted by stress, anxiety, and depression.  Also discusses healthy ways to reduce stress and to treat/manage anxiety and depression.  Written material given at graduation. Flowsheet Row Cardiac Rehab from 02/28/2021 in [facility] Cardiac and Pulmonary Rehab  Date 02/21/21  Educator [facility]  Instruction Review Code 1- Verbalizes Understanding       Education: Sleep Hygiene -Provides group verbal and written instruction about how sleep can affect your health.  Define sleep hygiene, discuss sleep cycles and impact of sleep habits. Review good sleep hygiene tips.    Initial Review & Psychosocial Screening:  Initial Psych Review & Screening - 12/26/20 0926       Initial Review   Current issues with History of  Depression      Family Dynamics   Good Support System? Yes    Comments His wife and friends are a good support system for him. He had a history of depression but has since then been ok.      Barriers   Psychosocial barriers to participate in program There are no identifiable barriers or psychosocial needs.;The patient should benefit from training in stress management and relaxation.      Screening Interventions   Interventions Encouraged to exercise;Provide feedback about the scores to participant;To provide support and resources with identified psychosocial needs    Expected Outcomes Short Term goal: Utilizing psychosocial counselor, staff and  physician to assist with identification of specific Stressors or current issues interfering with healing process. Setting desired goal for each stressor or current issue identified.;Long Term Goal: Stressors or current issues are controlled or eliminated.;Short Term goal: Identification and review with participant of any Quality of Life or Depression concerns found by scoring the questionnaire.;Long Term goal: The participant improves quality of Life and PHQ9 Scores as seen by post scores and/or verbalization of changes             Quality of Life Scores:   Quality of Life - 03/12/21 0937       Quality of Life Scores   Health/Function Pre 21.47 %    Health/Function Post 27.1 %    Health/Function % Change 26.22 %    Socioeconomic Pre 26.29 %    Socioeconomic Post 28 %    Socioeconomic % Change  6.5 %    Psych/Spiritual Pre 24.07 %    Psych/Spiritual Post 25.29 %    Psych/Spiritual % Change 5.07 %    Family Pre 22.4 %    Family Post 26.4 %    Family % Change 17.86 %    GLOBAL Pre 23.13 %    GLOBAL Post 26.84 %    GLOBAL % Change 16.04 %            Scores of 19 and below usually indicate a poorer quality of life in these areas.  A difference of  2-3 points is a clinically meaningful difference.  A difference of 2-3 points in the total  score of the Quality of Life Index has been associated with significant improvement in overall quality of life, self-image, physical symptoms, and general health in studies assessing change in quality of life.  PHQ-9: Recent Review Flowsheet Data     Depression screen [facility] 2/9 03/12/2021 12/31/2020 06/06/2020 05/29/2020 10/18/2019   Decreased Interest 0 0 0 0 0   Down, Depressed, Hopeless 0 0 0 0 0   PHQ - 2 Score 0 0 0 0 0   Altered sleeping 0 0 - - -   Tired, decreased energy 0 2 - - -   Change in appetite 0 1 - - -   Feeling bad or failure about yourself  0 0 - - -   Trouble concentrating 0 0 - - -   Moving slowly or fidgety/restless 0 0 - - -   Suicidal thoughts 0 0 - - -   PHQ-9 Score 0 3 - - -   Difficult doing work/chores Not difficult at all Not difficult at all - - -      Interpretation of Total Score  Total Score Depression Severity:  1-4 = Minimal depression, 5-9 = Mild depression, 10-14 = Moderate depression, 15-19 = Moderately severe depression, 20-27 = Severe depression   Psychosocial Evaluation and Intervention:  Psychosocial Evaluation - 12/26/20 0930       Psychosocial Evaluation & Interventions   Interventions Encouraged to exercise with the program and follow exercise prescription;Stress management education;Relaxation education    Comments His wife and friends are a good support system for him. He had a history of depression but has since then been ok.    Expected Outcomes Short: Exercise regularly to support mental health and notify staff of any changes. Long: maintain mental health and well being through teaching of rehab or prescribed medications independently.    Continue Psychosocial Services  Follow up required by staff  Psychosocial Re-Evaluation:  Psychosocial Re-Evaluation     Row Name 01/22/21 0944 02/26/21 0935 03/21/21 0927         Psychosocial Re-Evaluation   Current issues with Current Stress Concerns Current Stress  Concerns;Current Sleep Concerns Current Stress Concerns;Current Sleep Concerns     Comments [name] really wants to get back to riding his motorcycle.  He finds this very relaxing.  He plans to try a short ride sometime soon.  He does keep stress low by learning  to not get upset. Cashis is doing well in rehab.  He spent the weekend building a cross for his son's wedding next weekend.  He is feeling good mentally.  He continues to just let stress roll off.  He used to get intense but since retiring he's no longer in a hurry to get things done.  He has not been sleeping well recently. His lasix has him getting up frequent and has a hard time getting back to sleep.  He has tried melatonin some but it did not work.  He is able to use tylenol PM with some success. [name] has done well in rehab. He is doing well mentally.  He has enjoyed coming to class.  He had a good time at the wedding and his cross was well recieved.  They had a great time.  He continues to struggle with his sleep. He has really enjoyed learning about interval training and feels that it gave him the biggest benefit.  Classes just reinforced what he already knew.     Expected Outcomes Short: get a bike ride soon Long: maintain positive outlook Short: Continue to work on sleep Long: Continue to stay positive. Short: Continue to work on sleep Long: Continue to stay positive.     Interventions -- Encouraged to attend Cardiac Rehabilitation for the exercise Encouraged to attend Cardiac Rehabilitation for the exercise     Continue Psychosocial Services  -- Follow up required by staff Follow up required by staff              Psychosocial Discharge (Final Psychosocial Re-Evaluation):  Psychosocial Re-Evaluation - 03/21/21 0927       Psychosocial Re-Evaluation   Current issues with Current Stress Concerns;Current Sleep Concerns    Comments [name] has done well in rehab. He is doing well mentally.  He has enjoyed coming to class.  He had a good  time at the wedding and his cross was well recieved.  They had a great time.  He continues to struggle with his sleep. He has really enjoyed learning about interval training and feels that it gave him the biggest benefit.  Classes just reinforced what he already knew.    Expected Outcomes Short: Continue to work on sleep Long: Continue to stay positive.    Interventions Encouraged to attend Cardiac Rehabilitation for the exercise    Continue Psychosocial Services  Follow up required by staff             Vocational Rehabilitation: Provide vocational rehab assistance to qualifying candidates.   Vocational Rehab Evaluation & Intervention:  Vocational Rehab - 02/26/21 0929       Initial Vocational Rehab Evaluation & Intervention   Assessment shows need for Vocational Rehabilitation No             Education: Education Goals: Education classes will be provided on a variety of topics geared toward better understanding of heart health and risk factor modification. Participant will state understanding/return demonstration of topics presented as  noted by education test scores.  Learning Barriers/Preferences:  Learning Barriers/Preferences - 12/26/20 0925       Learning Barriers/Preferences   Learning Barriers None    Learning Preferences None             General Cardiac Education Topics:  AED/CPR: - Group verbal and written instruction with the use of models to demonstrate the basic use of the AED with the basic ABC's of resuscitation.   Anatomy and Cardiac Procedures: - Group verbal and visual presentation and models provide information about basic cardiac anatomy and function. Reviews the testing methods done to diagnose heart disease and the outcomes of the test results. Describes the treatment choices: Medical Management, Angioplasty, or Coronary Bypass Surgery for treating various heart conditions including Myocardial Infarction, Angina, Valve Disease, and Cardiac  Arrhythmias.  Written material given at graduation. Flowsheet Row Cardiac Rehab from 02/28/2021 in [facility] Cardiac and Pulmonary Rehab  Date 01/10/21  Educator KB  Instruction Review Code 1- Verbalizes Understanding       Medication Safety: - Group verbal and visual instruction to review commonly prescribed medications for heart and lung disease. Reviews the medication, class of the drug, and side effects. Includes the steps to properly store meds and maintain the prescription regimen.  Written material given at graduation.   Intimacy: - Group verbal instruction through game format to discuss how heart and lung disease can affect sexual intimacy. Written material given at graduation.. Flowsheet Row Cardiac Rehab from 02/28/2021 in [facility] Cardiac and Pulmonary Rehab  Date 01/03/21  Educator [facility]  Instruction Review Code 1- Verbalizes Understanding       Know Your Numbers and Heart Failure: - Group verbal and visual instruction to discuss disease risk factors for cardiac and pulmonary disease and treatment options.  Reviews associated critical values for Overweight/Obesity, Hypertension, Cholesterol, and Diabetes.  Discusses basics of heart failure: signs/symptoms and treatments.  Introduces Heart Failure Zone chart for action plan for heart failure.  Written material given at graduation. Flowsheet Row Cardiac Rehab from 02/28/2021 in [facility] Cardiac and Pulmonary Rehab  Education need identified 12/31/20  Date 02/07/21  Educator KB  Instruction Review Code 1- Verbalizes Understanding       Infection Prevention: - Provides verbal and written material to individual with discussion of infection control including proper hand washing and proper equipment cleaning during exercise session. Flowsheet Row Cardiac Rehab from 02/28/2021 in [facility] Cardiac and Pulmonary Rehab  Date 12/26/20  Educator [facility]  Instruction Review Code 1- Verbalizes Understanding       Falls Prevention: - Provides verbal  and written material to individual with discussion of falls prevention and safety. Flowsheet Row Cardiac Rehab from 02/28/2021 in [facility] Cardiac and Pulmonary Rehab  Date 12/26/20  Educator Cherylann Banas  Instruction Review Code 1- Verbalizes Understanding       Other: -Provides group and verbal instruction on various topics (see comments)   Knowledge Questionnaire Score:  Knowledge Questionnaire Score - 03/12/21 5573       Knowledge Questionnaire Score   Pre Score 24/26 Education Focus: Smoking, Nutrition    Post Score 25/26             Core Components/Risk Factors/Patient Goals at Admission:  Personal Goals and Risk Factors at Admission - 12/31/20 1200       Core Components/Risk Factors/Patient Goals on Admission    Weight Management Yes;Weight Loss    Intervention Weight Management: Develop a combined nutrition and exercise program designed to reach desired caloric intake, while  maintaining appropriate intake of nutrient and fiber, sodium and fats, and appropriate energy expenditure required for the weight goal.;Weight Management: Provide education and appropriate resources to help participant work on and attain dietary goals.;Weight Management/Obesity: Establish reasonable short term and long term weight goals.    Admit Weight 184 lb (83.5 kg)    Goal Weight: Short Term 180 lb (81.6 kg)    Goal Weight: Long Term 174 lb (78.9 kg)    Expected Outcomes Short Term: Continue to assess and modify interventions until short term weight is achieved;Long Term: Adherence to nutrition and physical activity/exercise program aimed toward attainment of established weight goal;Weight Loss: Understanding of general recommendations for a balanced deficit meal plan, which promotes 1-2 lb weight loss per week and includes a negative energy balance of [phone] kcal/d;Understanding recommendations for meals to include 15-35% energy as protein, 25-35% energy from fat, 35-60% energy from carbohydrates, less than  285m of dietary cholesterol, 20-35 gm of total fiber daily;Understanding of distribution of calorie intake throughout the day with the consumption of 4-5 meals/snacks    Diabetes Yes    Intervention Provide education about signs/symptoms and action to take for hypo/hyperglycemia.;Provide education about proper nutrition, including hydration, and aerobic/resistive exercise prescription along with prescribed medications to achieve blood glucose in normal ranges: Fasting glucose 65-99 mg/dL    Expected Outcomes Long Term: Attainment of HbA1C < 7%.;Short Term: Participant verbalizes understanding of the signs/symptoms and immediate care of hyper/hypoglycemia, proper foot care and importance of medication, aerobic/resistive exercise and nutrition plan for blood glucose control.    Heart Failure Yes    Intervention Provide a combined exercise and nutrition program that is supplemented with education, support and counseling about heart failure. Directed toward relieving symptoms such as shortness of breath, decreased exercise tolerance, and extremity edema.    Expected Outcomes Improve functional capacity of life;Short term: Attendance in program 2-3 days a week with increased exercise capacity. Reported lower sodium intake. Reported increased fruit and vegetable intake. Reports medication compliance.;Short term: Daily weights obtained and reported for increase. Utilizing diuretic protocols set by physician.;Long term: Adoption of self-care skills and reduction of barriers for early signs and symptoms recognition and intervention leading to self-care maintenance.    Hypertension Yes    Intervention Provide education on lifestyle modifcations including regular physical activity/exercise, weight management, moderate sodium restriction and increased consumption of fresh fruit, vegetables, and low fat dairy, alcohol moderation, and smoking cessation.;Monitor prescription use compliance.    Expected Outcomes Long  Term: Maintenance of blood pressure at goal levels.;Short Term: Continued assessment and intervention until BP is < 140/992mHG in hypertensive participants. < 130/8036mG in hypertensive participants with diabetes, heart failure or chronic kidney disease.    Lipids Yes    Intervention Provide education and support for participant on nutrition & aerobic/resistive exercise along with prescribed medications to achieve LDL <67m21mDL >40mg31m Expected Outcomes Short Term: Participant states understanding of desired cholesterol values and is compliant with medications prescribed. Participant is following exercise prescription and nutrition guidelines.;Long Term: Cholesterol controlled with medications as prescribed, with individualized exercise RX and with personalized nutrition plan. Value goals: LDL < 67mg,29m > 40 mg.             Education:Diabetes - Individual verbal and written instruction to review signs/symptoms of diabetes, desired ranges of glucose level fasting, after meals and with exercise. Acknowledge that pre and post exercise glucose checks will be done for 3 sessions at entry of program. Flowsheet  Row Cardiac Rehab from 02/28/2021 in [facility] Cardiac and Pulmonary Rehab  Date 12/26/20  Educator [facility]  Instruction Review Code 1- Verbalizes Understanding       Core Components/Risk Factors/Patient Goals Review:   Goals and Risk Factor Review     Row Name 01/22/21 0930 02/26/21 0939 03/21/21 0932         Core Components/Risk Factors/Patient Goals Review   Personal Goals Review Weight Management/Obesity;Heart Failure;Diabetes;Hypertension Weight Management/Obesity;Heart Failure;Diabetes;Hypertension Weight Management/Obesity;Heart Failure;Diabetes;Hypertension     Review [name] does check his weight and BG at home.  His weight is about the same but he feels his appetite is back and he is building muscle back.  He also monitors BP at home.  He wants to "take his life back".  He would  like to ride his motocycle here. [name] is doing well with his weight and still feels like he is gaining muscle.  His pressures have been good still.  His sugars seem okay, but he has not been checking them.  He denies heart failure symptoms. He is eating more small meals to help balance. [name] had a good follow up with his primary yesterday.  They have also started on trulicity to help with sugars.  His pressures continues to do well.  He has not had any heart failure symptoms and plans to stay on top of all of this more.     Expected Outcomes Short: continue to monitor vitals Long: manage risk factors long term Short: Continue to work on [employer] Long: Continue to manage heart failure Continue to montior risk factors and work on diet.              Core Components/Risk Factors/Patient Goals at Discharge (Final Review):   Goals and Risk Factor Review - 03/21/21 0932       Core Components/Risk Factors/Patient Goals Review   Personal Goals Review Weight Management/Obesity;Heart Failure;Diabetes;Hypertension    Review [name] had a good follow up with his primary yesterday.  They have also started on trulicity to help with sugars.  His pressures continues to do well.  He has not had any heart failure symptoms and plans to stay on top of all of this more.    Expected Outcomes Continue to montior risk factors and work on diet.             ITP Comments:  ITP Comments     Row Name 12/26/20 0932 12/31/20 1149 01/01/21 0922 01/07/21 0835 01/16/21 0809   ITP Comments Virtual Visit completed. Patient informed on EP and RD appointment and 6 Minute walk test. Patient also informed of patient health questionnaires on My Chart. Patient Verbalizes understanding. Visit diagnosis can be found in [facility] 11/15/2020. Completed 6MWT and gym orientation. Initial ITP created and sent for review to Dr. Emily Filbert, Medical Director. First full day of exercise!  Patient was oriented to gym and equipment including functions,  settings, policies, and procedures.  Patient's individual exercise prescription and treatment plan were reviewed.  All starting workloads were established based on the results of the 6 minute walk test done at initial orientation visit.  The plan for exercise progression was also introduced and progression will be customized based on patient's performance and goals. Completed initial RD consultation 30 Day review completed. Medical Director ITP review done, changes made as directed, and signed approval by Medical Director.    Chelsea Name 02/13/21 1443 03/13/21 0820 03/28/21 0927       ITP Comments 30 day review completed. ITP  sent to Dr. Emily Filbert, Medical Director of Cardiac Rehab. Continue with ITP unless changes are made by physician. 30 Day review completed. Medical Director ITP review done, changes made as directed, and signed approval by Medical Director. Sloane graduated today from  rehab with 36 sessions completed.  Details of the patient's exercise prescription and what He needs to do in order to continue the prescription and progress were discussed with patient.  Patient was given a copy of prescription and goals.  Patient verbalized understanding.  Myles plans to continue to exercise by going to [employer].              Comments: discharge ITP

## 2021-04-09 ENCOUNTER — Other Ambulatory Visit: Payer: Medicare Other

## 2021-04-10 ENCOUNTER — Telehealth: Payer: Self-pay | Admitting: Family Medicine

## 2021-04-10 ENCOUNTER — Other Ambulatory Visit: Payer: Self-pay

## 2021-04-10 ENCOUNTER — Other Ambulatory Visit (INDEPENDENT_AMBULATORY_CARE_PROVIDER_SITE_OTHER): Payer: Medicare Other

## 2021-04-10 DIAGNOSIS — Z79899 Other long term (current) drug therapy: Secondary | ICD-10-CM | POA: Diagnosis not present

## 2021-04-10 DIAGNOSIS — E7800 Pure hypercholesterolemia, unspecified: Secondary | ICD-10-CM

## 2021-04-10 DIAGNOSIS — I25118 Atherosclerotic heart disease of native coronary artery with other forms of angina pectoris: Secondary | ICD-10-CM | POA: Diagnosis not present

## 2021-04-10 DIAGNOSIS — E785 Hyperlipidemia, unspecified: Secondary | ICD-10-CM | POA: Diagnosis not present

## 2021-04-10 DIAGNOSIS — Z1211 Encounter for screening for malignant neoplasm of colon: Secondary | ICD-10-CM

## 2021-04-10 NOTE — Telephone Encounter (Signed)
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 I can refer him for the colonoscopy once you speak with him. Thanks.

## 2021-04-10 NOTE — Telephone Encounter (Signed)
-----   Message from Minna Merritts, MD sent at 04/07/2021  3:25 PM EST ----- I think he should be good to go,  Typically 3 months after cabg, should be well healed Thx TG  ----- Message ----- From: [name], MD Sent: 03/20/2021  10:02 AM EST To: Minna Merritts, MD  Hi Tim,  I saw Mr Spieker for follow-up today and he is due for a colonoscopy. 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 Does he need to wait a certain amount of time after that prior to having his colonoscopy completed? Thanks.   Randall Hiss

## 2021-04-11 LAB — BASIC METABOLIC PANEL
BUN/Creatinine Ratio: 17 (ref 10–24)
BUN: 17 mg/dL (ref 8–27)
CO2: 23 mmol/L (ref 20–29)
Calcium: 10 mg/dL (ref 8.6–10.2)
Chloride: 98 mmol/L (ref 96–106)
Creatinine, Ser: 1.01 mg/dL (ref 0.76–1.27)
Glucose: 211 mg/dL — ABNORMAL HIGH (ref 70–99)
Potassium: 3.8 mmol/L (ref 3.5–5.2)
Sodium: 139 mmol/L (ref 134–144)
eGFR: 80 mL/min/{1.73_m2} (ref >59)

## 2021-04-11 LAB — LIPID PANEL
Chol/HDL Ratio: 3 ratio (ref 0.0–5.0)
Cholesterol, Total: 95 mg/dL — ABNORMAL LOW (ref 100–199)
HDL: 32 mg/dL — ABNORMAL LOW (ref >39)
LDL Chol Calc (NIH): 47 mg/dL (ref 0–99)
Triglycerides: 76 mg/dL (ref 0–149)
VLDL Cholesterol Cal: 16 mg/dL (ref 5–40)

## 2021-04-11 NOTE — Telephone Encounter (Signed)
LVM for patient to call back and ask for Kento Gossman,cma

## 2021-04-16 ENCOUNTER — Ambulatory Visit (INDEPENDENT_AMBULATORY_CARE_PROVIDER_SITE_OTHER): Payer: Medicare Other

## 2021-04-16 ENCOUNTER — Other Ambulatory Visit: Payer: Self-pay

## 2021-04-16 DIAGNOSIS — I255 Ischemic cardiomyopathy: Secondary | ICD-10-CM

## 2021-04-16 LAB — ECHOCARDIOGRAM COMPLETE
AR max vel: 2.05 cm2
AV Area VTI: 2.04 cm2
AV Area mean vel: 2.2 cm2
AV Mean grad: 7 mmHg
AV Peak grad: 10.8 mmHg
Ao pk vel: 1.64 m/s
Area-P 1/2: 3.85 cm2
Calc EF: 51 %
S' Lateral: 4.3 cm
Single Plane A2C EF: 54.3 %
Single Plane A4C EF: 48.2 %

## 2021-04-16 NOTE — Telephone Encounter (Signed)
I called and spoke with the patient and he is okay with the referral for his colonoscopy.  Kyree Adriano,cma

## 2021-04-17 NOTE — Telephone Encounter (Signed)
Referral placed.

## 2021-04-17 NOTE — Addendum Note (Signed)
Addended by: Caryl Bis, Naveyah Iacovelli G on: 04/17/2021 12:15 PM   Modules accepted: Orders

## 2021-04-23 ENCOUNTER — Telehealth: Payer: Self-pay

## 2021-04-23 NOTE — Telephone Encounter (Signed)
Able to reach pt regarding his recent ECHO & Labs Dr. Rockey Situ had a chance to review his results and advised   "Echo  Some improvement in ejection fraction now 40-45, up from the 30s  Mild valvular issues, will need periodic observation " "Cholesterol at goal  Sugars running little bit high  Other labs look good "  [name] very thankful for the phone call of his results, all questions and concerns were address with nothing further at this time. Will see at next schedule f/u appt.

## 2021-04-27 DIAGNOSIS — Z20822 Contact with and (suspected) exposure to covid-19: Secondary | ICD-10-CM | POA: Diagnosis not present

## 2021-05-02 ENCOUNTER — Ambulatory Visit: Payer: Medicare Other | Admitting: Dermatology

## 2021-05-14 ENCOUNTER — Encounter: Payer: Self-pay | Admitting: Dermatology

## 2021-05-14 ENCOUNTER — Ambulatory Visit (INDEPENDENT_AMBULATORY_CARE_PROVIDER_SITE_OTHER): Payer: Medicare Other | Admitting: Dermatology

## 2021-05-14 ENCOUNTER — Other Ambulatory Visit: Payer: Self-pay

## 2021-05-14 DIAGNOSIS — D492 Neoplasm of unspecified behavior of bone, soft tissue, and skin: Secondary | ICD-10-CM | POA: Diagnosis not present

## 2021-05-14 DIAGNOSIS — Z85828 Personal history of other malignant neoplasm of skin: Secondary | ICD-10-CM | POA: Diagnosis not present

## 2021-05-14 DIAGNOSIS — Z1283 Encounter for screening for malignant neoplasm of skin: Secondary | ICD-10-CM

## 2021-05-14 DIAGNOSIS — L910 Hypertrophic scar: Secondary | ICD-10-CM | POA: Diagnosis not present

## 2021-05-14 DIAGNOSIS — L570 Actinic keratosis: Secondary | ICD-10-CM | POA: Diagnosis not present

## 2021-05-14 DIAGNOSIS — L821 Other seborrheic keratosis: Secondary | ICD-10-CM

## 2021-05-14 DIAGNOSIS — D229 Melanocytic nevi, unspecified: Secondary | ICD-10-CM

## 2021-05-14 DIAGNOSIS — L814 Other melanin hyperpigmentation: Secondary | ICD-10-CM

## 2021-05-14 DIAGNOSIS — L578 Other skin changes due to chronic exposure to nonionizing radiation: Secondary | ICD-10-CM | POA: Diagnosis not present

## 2021-05-14 DIAGNOSIS — D1800 Hemangioma unspecified site: Secondary | ICD-10-CM | POA: Diagnosis not present

## 2021-05-14 NOTE — Patient Instructions (Addendum)
Prior to procedure, discussed risks of blister formation, small wound, skin dyspigmentation, or rare scar following cryotherapy. Recommend Vaseline ointment to treated areas while healing.   Cryotherapy Aftercare  Wash gently with soap and water everyday.   Apply Vaseline and Band-Aid daily until healed.   Melanoma ABCDEs  Melanoma is the most dangerous type of skin cancer, and is the leading cause of death from skin disease.  You are more likely to develop melanoma if you: Have light-colored skin, light-colored eyes, or red or blond hair Spend a lot of time in the sun Tan regularly, either outdoors or in a tanning bed Have had blistering sunburns, especially during childhood Have a close family member who has had a melanoma Have atypical moles or large birthmarks  Early detection of melanoma is key since treatment is typically straightforward and cure rates are extremely high if we catch it early.   The first sign of melanoma is often a change in a mole or a new dark spot.  The ABCDE system is a way of remembering the signs of melanoma.  A for asymmetry:  The two halves do not match. B for border:  The edges of the growth are irregular. C for color:  A mixture of colors are present instead of an even brown color. D for diameter:  Melanomas are usually (but not always) greater than 76mm - the size of a pencil eraser. E for evolution:  The spot keeps changing in size, shape, and color.  Please check your skin once per month between visits. You can use a small mirror in front and a large mirror behind you to keep an eye on the back side or your body.   If you see any new or changing lesions before your next follow-up, please call to schedule a visit.  Please continue daily skin protection including broad spectrum sunscreen SPF 30+ to sun-exposed areas, reapplying every 2 hours as needed when you're outdoors.   Staying in the shade or wearing long sleeves, sun glasses (UVA+UVB  protection) and wide brim hats (4-inch brim around the entire circumference of the hat) are also recommended for sun protection.   Recommend taking Heliocare sun protection supplement daily in sunny weather for additional sun protection. For maximum protection on the sunniest days, you can take up to 2 capsules of regular Heliocare OR take 1 capsule of Heliocare Ultra. For prolonged exposure (such as a full day in the sun), you can repeat your dose of the supplement 4 hours after your first dose. Heliocare can be purchased at [facility] or at [email or web address].    Levulan/PDT Treatment Common Side Effects  - Burning/stinging, which may be severe and last up to 24-72 hours after your treatment  - Redness, swelling and/or peeling which may last up to 4 weeks  - Scaling/crusting which may last up to 2 weeks  - Sun sensitivity (you MUST avoid sun exposure for 48-72 hours after treatment)  Care Instructions  - Okay to wash with soap and water and shampoo as normal  - If needed, you can do a cold compress (ex. Ice packs) for comfort  - If okay with your Primary Doctor, you may use analgesics such as Tylenol every 4-6 hours, not to exceed recommended dose  - You may apply Cerave Healing Ointment, Vaseline or Aquaphor  - If you have a lot of swelling you may take a Benadryl to help with this (this may cause drowsiness)  Sun Precautions  - Wear a  wide brim hat for the next week if outside  - Wear a sunblock with zinc or titanium dioxide at least SPF 50 daily   We will recheck you in 10-12 weeks. If any problems, please call the office and ask to speak with a nurse.   If You Need Anything After Your Visit  If you have any questions or concerns for your doctor, please call our main line at [phone] and press option 4 to reach your doctor's medical assistant. If no one answers, please leave a voicemail as directed and we will return your call as soon as possible. Messages  left after 4 pm will be answered the following business day.   You may also send [place] a message via [place]. We typically respond to MyChart messages within 1-2 business days.  For prescription refills, please ask your pharmacy to contact our office. Our fax number is [phone].  If you have an urgent issue when the clinic is closed that cannot wait until the next business day, you can page your doctor at the number below.    Please note that while we do our best to be available for urgent issues outside of office hours, we are not available 24/7.   If you have an urgent issue and are unable to reach [place], you may choose to seek medical care at your doctor's office, retail clinic, urgent care center, or emergency room.  If you have a medical emergency, please immediately call 911 or go to the emergency department.  Pager Numbers  - Dr. Nehemiah Massed: [phone]  - Dr. Laurence Ferrari: [phone]  - Dr. Nicole Kindred: [phone]  In the event of inclement weather, please call our main line at [phone] for an update on the status of any delays or closures.  Dermatology Medication Tips: Please keep the boxes that topical medications come in in order to help keep track of the instructions about where and how to use these. Pharmacies typically print the medication instructions only on the boxes and not directly on the medication tubes.   If your medication is too expensive, please contact our office at [phone] option 4 or send [place] a message through [place].   We are unable to tell what your co-pay for medications will be in advance as this is different depending on your insurance coverage. However, we may be able to find a substitute medication at lower cost or fill out paperwork to get insurance to cover a needed medication.   If a prior authorization is required to get your medication covered by your insurance company, please allow [place] 1-2 business days to complete this process.  Drug prices  often vary depending on where the prescription is filled and some pharmacies may offer cheaper prices.  The website www.goodrx.com contains coupons for medications through different pharmacies. The prices here do not account for what the cost may be with help from insurance (it may be cheaper with your insurance), but the website can give you the price if you did not use any insurance.  - You can print the associated coupon and take it with your prescription to the pharmacy.  - You may also stop by our office during regular business hours and pick up a GoodRx coupon card.  - If you need your prescription sent electronically to a different pharmacy, notify our office through [facility] or by phone at [phone] option 4.     Si Usted Necesita Algo Despus de Su Visita  Tambin puede enviarnos un mensaje  a travs de MyChart. Por lo general respondemos a los mensajes de MyChart en el transcurso de 1 a 2 das hbiles.  Para renovar recetas, por favor pida a su farmacia que se ponga en contacto con nuestra oficina. Harland Dingwall de fax es [place] [phone].  Si tiene un asunto urgente cuando la clnica est cerrada y que no puede esperar hasta el siguiente da hbil, puede llamar/localizar a su doctor(a) al nmero que aparece a continuacin.   Por favor, tenga en cuenta que aunque hacemos todo lo posible para estar disponibles para asuntos urgentes fuera del horario de [place], no estamos disponibles las 24 horas del da, los 7 das de la [place].   Si tiene un problema urgente y no puede comunicarse con nosotros, puede optar por buscar atencin mdica  en el consultorio de su doctor(a), en una clnica privada, en un centro de atencin urgente o en una sala de emergencias.  Si tiene Engineering geologist, por favor llame inmediatamente al 911 o vaya a la sala de emergencias.  Nmeros de bper  - Dr. Nehemiah Massed: [phone]  - Dra. Moye: [phone]  - Dra. Nicole Kindred:  [phone]  En caso de inclemencias del [place], por favor llame a Johnsie Kindred principal al [phone] para una actualizacin sobre el [place] de cualquier retraso o cierre.  Consejos para la medicacin en dermatologa: Por favor, guarde las cajas en las que vienen los medicamentos de uso tpico para ayudarle a seguir las instrucciones sobre dnde y cmo usarlos. Las farmacias generalmente imprimen las instrucciones del medicamento slo en las cajas y no directamente en los tubos del [place].   Si su medicamento es muy caro, por favor, pngase en contacto con Zigmund Daniel llamando al [phone] y presione la opcin 4 o envenos un mensaje a travs de Pharmacist, community.   No podemos decirle cul ser su copago por los medicamentos por adelantado ya que esto es diferente dependiendo de la cobertura de su seguro. Sin embargo, es posible que podamos encontrar un medicamento sustituto a Electrical engineer un formulario para que el seguro cubra el medicamento que se considera necesario.   Si se requiere una autorizacin previa para que su compaa de seguros [place] su medicamento, por favor permtanos de 1 a 2 das hbiles para completar este proceso.  Los precios de los medicamentos varan con frecuencia dependiendo del Environmental consultant de dnde se surte la receta y alguna farmacias pueden ofrecer precios ms baratos.  El sitio web www.goodrx.com tiene cupones para medicamentos de Airline pilot. Los precios aqu no tienen en cuenta lo que podra costar con la ayuda del seguro (puede ser ms barato con su seguro), pero el sitio web puede darle el precio si no utiliz Research scientist (physical sciences).  - Puede imprimir el cupn correspondiente y llevarlo con su receta a la farmacia.  - Tambin puede pasar por nuestra oficina durante el horario de atencin regular y Charity fundraiser una tarjeta de cupones de GoodRx.  - Si necesita que su receta se enve electrnicamente a una farmacia diferente, informe a nuestra oficina a travs de  MyChart de [place] o por telfono llamando al [phone] y presione la opcin 4.

## 2021-05-14 NOTE — Progress Notes (Signed)
Follow-Up Visit   Subjective  [name] is a 71 y.o. male who presents for the following: Follow-up (Recheck BCC's. left upper back lateral (EDC) ,  left upper arm anterior (EDC), left upper back medial (EDC) ) and Annual Exam (HxAK. Hx BCC's. No particular areas of concern. ).  Patient here for full body skin exam and skin cancer screening.   The following portions of the chart were reviewed this encounter and updated as appropriate:  Tobacco   Allergies   Meds   Problems   Med Hx   Surg Hx   Fam Hx       Review of Systems: No other skin or systemic complaints except as noted in HPI or Assessment and Plan.   Objective  Well appearing patient in no apparent distress; mood and affect are within normal limits.  A full examination was performed including scalp, head, eyes, ears, nose, lips, neck, chest, axillae, abdomen, back, buttocks, bilateral upper extremities, bilateral lower extremities, hands, feet, fingers, toes, fingernails, and toenails. All findings within normal limits unless otherwise noted below.  Left Upper Back x2, left upper arm x1 Firm pink/brown dermal papule(s)/plaque(s).   Right Lower Back x1 Erythematous thin papules/macules with gritty scale.   Right Thumb Nail Plate Lateral nail dystrophy, keratotic at proximal nail fold        Assessment & Plan   Lentigines - Scattered tan macules - Due to sun exposure - Benign-appearing, observe - Recommend daily broad spectrum sunscreen SPF 30+ to sun-exposed areas, reapply every 2 hours as needed. - Call for any changes  Seborrheic Keratoses - Stuck-on, waxy, tan-brown papules and/or plaques  - Benign-appearing - Discussed benign etiology and prognosis. - Observe - Call for any changes  Melanocytic Nevi - Tan-brown and/or pink-flesh-colored symmetric macules and papules - Benign appearing on exam today - Observation - Call clinic for new or changing moles - Recommend daily use of broad  spectrum spf 30+ sunscreen to sun-exposed areas.   Hemangiomas - Red papules - Discussed benign nature - Observe - Call for any changes  Actinic Damage - Severe, confluent actinic changes with pre-cancerous actinic keratoses  - Severe, chronic, not at goal, secondary to cumulative UV radiation exposure over time - diffuse scaly erythematous macules and papules with underlying dyspigmentation - Discussed Prescription "Field Treatment" for Severe, Chronic Confluent Actinic Changes with Pre-Cancerous Actinic Keratoses Field treatment involves treatment of an entire area of skin that has confluent Actinic Changes (Sun/ Ultraviolet light damage) and PreCancerous Actinic Keratoses by method of PhotoDynamic Therapy (PDT) and/or prescription Topical Chemotherapy agents such as 5-fluorouracil, 5-fluorouracil/calcipotriene, and/or imiquimod.  The purpose is to decrease the number of clinically evident and subclinical PreCancerous lesions to prevent progression to development of skin cancer by chemically destroying early precancer changes that may or may not be visible.  It has been shown to reduce the risk of developing skin cancer in the treated area. As a result of treatment, redness, scaling, crusting, and open sores may occur during treatment course. One or more than one of these methods may be used and may have to be used several times to control, suppress and eliminate the PreCancerous changes. Discussed treatment course, expected reaction, and possible side effects. - Recommend daily broad spectrum sunscreen SPF 30+ to sun-exposed areas, reapply every 2 hours as needed.  - Staying in the shade or wearing long sleeves, sun glasses (UVA+UVB protection) and wide brim hats (4-inch brim around the entire circumference of the hat) are also  recommended. - Call for new or changing lesions. - Will schedule photodynamic therapy to the scalp and face.   Skin cancer screening performed today.  History of Basal  Cell Carcinoma of the Skin - No evidence of recurrence today left upper back lateral,  left upper arm anterior, left upper back medial  - Recommend regular full body skin exams - Recommend daily broad spectrum sunscreen SPF 30+ to sun-exposed areas, reapply every 2 hours as needed.  - Call if any new or changing lesions are noted between office visits  Keloid Left Upper Back x2, left upper arm x1  Symptomatic  Intralesional steroid injection side effects were reviewed including thinning of the skin and discoloration, such as redness, lightening or darkening.    Intralesional injection - Left Upper Back x2, left upper arm x1 Location: left upper back, left upper arm  Informed Consent: Discussed risks (infection, pain, bleeding, bruising, thinning of the skin, loss of skin pigment, lack of resolution, and recurrence of lesion) and benefits of the procedure, as well as the alternatives. Informed consent was obtained. Preparation: The area was prepared a standard fashion.  Anesthesia:  Procedure Details: An intralesional injection was performed with Kenalog 10 mg/cc. 0.6 cc in total were injected.  Total number of injections: 3  Plan: The patient was instructed on post-op care. Recommend OTC analgesia as needed for pain.   AK (actinic keratosis) Right Lower Back x1  Actinic keratoses are precancerous spots that appear secondary to cumulative UV radiation exposure/sun exposure over time. They are chronic with expected duration over 1 year. A portion of actinic keratoses will progress to squamous cell carcinoma of the skin. It is not possible to reliably predict which spots will progress to skin cancer and so treatment is recommended to prevent development of skin cancer.  Recommend daily broad spectrum sunscreen SPF 30+ to sun-exposed areas, reapply every 2 hours as needed.  Recommend staying in the shade or wearing long sleeves, sun glasses (UVA+UVB protection) and wide brim hats  (4-inch brim around the entire circumference of the hat). Call for new or changing lesions.  Prior to procedure, discussed risks of blister formation, small wound, skin dyspigmentation, or rare scar following cryotherapy. Recommend Vaseline ointment to treated areas while healing.   Destruction of lesion - Right Lower Back x1  Destruction method: cryotherapy   Informed consent: discussed and consent obtained   Lesion destroyed using liquid nitrogen: Yes   Outcome: patient tolerated procedure well with no complications   Post-procedure details: wound care instructions given    Neoplasm of skin Right Thumb Nail Plate  Present for a couple of years. Recommend biopsy of nail matrix. Will discuss with Dr. Nehemiah Massed and refer to him for biopsy.   Return for TBSE 6 months, PDT face/scalp next available.Loraine Maple, CMA, am acting as scribe for Forest Gleason, MD.  Documentation: I have reviewed the above documentation for accuracy and completeness, and I agree with the above.  Forest Gleason, MD

## 2021-05-15 ENCOUNTER — Telehealth: Payer: Self-pay

## 2021-05-15 ENCOUNTER — Telehealth: Payer: Self-pay | Admitting: Dermatology

## 2021-05-15 NOTE — Telephone Encounter (Signed)
Please call patient and let him know Dr. Nehemiah Massed reviewed the photos and agrees with scheduling for biopsy. However, he also recommended treating for possible bacterial infection in the meantime to see if his nail clears/improves between now and his appointment with Dr. Nehemiah Massed. He recommends doxycycline 100 mg twice a day for 2 weeks, and then once a day for 4 weeks.  Doxycycline should be taken with food to prevent nausea. Do not lay down for 30 minutes after taking. Be cautious with sun exposure and use good sun protection while on this medication. Pregnant women should not take this medication.   Dr. Nehemiah Massed suggests putting him at 4:30 PM on his Tuesday schedule. Approximately 6-8 weeks out would be good.   Please review with patient, send in doxycycline if he is in agreement, and schedule for nail biopsy/fu appt with Dr. Nehemiah Massed.   Thank you!

## 2021-05-15 NOTE — Telephone Encounter (Signed)
LVM for patient to return call./js

## 2021-05-20 ENCOUNTER — Ambulatory Visit (INDEPENDENT_AMBULATORY_CARE_PROVIDER_SITE_OTHER): Payer: Medicare Other

## 2021-05-20 ENCOUNTER — Other Ambulatory Visit: Payer: Self-pay

## 2021-05-20 DIAGNOSIS — L570 Actinic keratosis: Secondary | ICD-10-CM

## 2021-05-20 MED ORDER — AMINOLEVULINIC ACID HCL 20 % EX SOLR
1.0000 "application " | Freq: Once | CUTANEOUS | Status: AC
  Administered 2021-05-20: 354 mg via TOPICAL

## 2021-05-20 NOTE — Patient Instructions (Signed)

## 2021-05-20 NOTE — Progress Notes (Signed)
1. AK (actinic keratosis) Scalp  Photodynamic therapy - Scalp Procedure discussed: discussed risks, benefits, side effects. and alternatives   Prep: site scrubbed/prepped with acetone   Location:  Scalp Number of lesions:  Multiple Type of treatment:  Blue light Aminolevulinic Acid (see MAR for details): Levulan Number of Levulan sticks used:  1 Incubation time (minutes):  120 Number of minutes under lamp:  16 Number of seconds under lamp:  40 Cooling:  Floor fan Outcome: patient tolerated procedure well with no complications   Post-procedure details: sunscreen applied and aftercare instructions given to patient    Aminolevulinic Acid HCl 20 % SOLR 354 mg - Scalp

## 2021-05-21 DIAGNOSIS — Z20822 Contact with and (suspected) exposure to covid-19: Secondary | ICD-10-CM | POA: Diagnosis not present

## 2021-05-22 ENCOUNTER — Telehealth: Payer: Self-pay

## 2021-05-22 NOTE — Telephone Encounter (Signed)
LVM for pt to return call./js

## 2021-05-23 ENCOUNTER — Encounter: Payer: Self-pay | Admitting: Dermatology

## 2021-05-27 ENCOUNTER — Ambulatory Visit (INDEPENDENT_AMBULATORY_CARE_PROVIDER_SITE_OTHER): Payer: Medicare Other

## 2021-05-27 ENCOUNTER — Other Ambulatory Visit: Payer: Self-pay

## 2021-05-27 DIAGNOSIS — L570 Actinic keratosis: Secondary | ICD-10-CM | POA: Diagnosis not present

## 2021-05-27 MED ORDER — AMINOLEVULINIC ACID HCL 20 % EX SOLR
1.0000 "application " | Freq: Once | CUTANEOUS | Status: AC
  Administered 2021-05-27: 354 mg via TOPICAL

## 2021-05-27 NOTE — Progress Notes (Signed)
Patient completed PDT therapy today.  1. AK (actinic keratosis) Head - Anterior (Face)  Photodynamic therapy - Head - Anterior (Face) Procedure discussed: discussed risks, benefits, side effects. and alternatives   Prep: site scrubbed/prepped with acetone   Location:  Face Number of lesions:  Multiple Type of treatment:  Blue light Aminolevulinic Acid (see MAR for details): Levulan Number of Levulan sticks used:  1 Incubation time (minutes):  60 Number of minutes under lamp:  16 Number of seconds under lamp:  40 Cooling:  Floor fan Outcome: patient tolerated procedure well with no complications   Post-procedure details: sunscreen applied    Patient given samples of Solbar Sunscreen and Vanicream Face Wash and Moisturizer.

## 2021-05-27 NOTE — Patient Instructions (Signed)

## 2021-06-03 ENCOUNTER — Other Ambulatory Visit: Payer: Self-pay

## 2021-06-03 ENCOUNTER — Ambulatory Visit (INDEPENDENT_AMBULATORY_CARE_PROVIDER_SITE_OTHER): Payer: Medicare Other | Admitting: Orthopaedic Surgery

## 2021-06-03 VITALS — Ht 68.0 in | Wt 183.8 lb

## 2021-06-03 DIAGNOSIS — M25552 Pain in left hip: Secondary | ICD-10-CM

## 2021-06-03 DIAGNOSIS — M7062 Trochanteric bursitis, left hip: Secondary | ICD-10-CM | POA: Diagnosis not present

## 2021-06-03 MED ORDER — METHYLPREDNISOLONE ACETATE 40 MG/ML IJ SUSP
40.0000 mg | INTRAMUSCULAR | Status: AC | PRN
  Administered 2021-06-03: 40 mg via INTRA_ARTICULAR

## 2021-06-03 MED ORDER — LIDOCAINE HCL 1 % IJ SOLN
3.0000 mL | INTRAMUSCULAR | Status: AC | PRN
  Administered 2021-06-03: 3 mL

## 2021-06-03 NOTE — Progress Notes (Signed)
Office Visit Note   Patient: [name]           Date of Birth: 03-20-1951           MRN: [id number] Visit Date: 06/03/2021              Requested by: [name], MD [address] [place],  [place] [address] PCP: [name], MD   Assessment & Plan: Visit Diagnoses:  1. Trochanteric bursitis, left hip   2. Pain in left hip     Plan: Per the patient's request I did provide a steroid injection over his left hip trochanteric area which he tolerated well.  All questions and concerns were answered addressed.  Follow-up can be as needed.  Follow-Up Instructions: Return if symptoms worsen or fail to improve.   Orders:  Orders Placed This Encounter  Procedures   Large Joint Inj   No orders of the defined types were placed in this encounter.     Procedures: Large Joint Inj: L greater trochanter on 06/03/2021 11:07 AM Indications: pain and diagnostic evaluation Details: 22 G 1.5 in needle, lateral approach  Arthrogram: No  Medications: 3 mL lidocaine 1 %; 40 mg methylPREDNISolone acetate 40 MG/ML Outcome: tolerated well, no immediate complications Procedure, treatment alternatives, risks and benefits explained, specific risks discussed. Consent was given by the patient. Immediately prior to procedure a time out was called to verify the correct patient, procedure, equipment, support staff and site/side marked as required. Patient was prepped and draped in the usual sterile fashion.      Clinical Data: No additional findings.   Subjective: Chief Complaint  Patient presents with   Left Hip - Pain  The patient comes in today requesting a left hip trochanteric injection.  I did this 4 months ago on him and he said it helped greatly and his pain only just came back recently.  He has been doing well in his recovery from cardiac bypass surgery.  He is a diabetic but has good control.  He has had no adverse effects of the previous steroid  injection on his left hip trochanteric area.  He has had no other acute change in medical status.  HPI  Review of Systems There is no listed fever, chills, nausea, vomiting  Objective: Vital Signs: Ht 5\' 8"  (1.727 m)    Wt 183 lb 12.8 oz (83.4 kg)    BMI 27.95 kg/m   Physical Exam He is alert and orient x3 and in no acute distress Ortho Exam Examination of his left hip shows only pain over the trochanteric area and the proximal IT band. Specialty Comments:  No specialty comments available.  Imaging: No results found.   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   Coronary artery disease 11/20/2020   Rash 01/25/2020   Colon cancer screening 10/18/2019   Bone spicules of jaw 12/27/2018   Ingrowing nail 12/27/2018   Insomnia 02/18/2018   Ingrown toenail 01/26/2018   Adrenal adenoma 03/03/2017   Right shoulder pain 03/03/2017   History of colon polyps 12/01/2016   Olecranon bursitis of left elbow 12/01/2016   OSA (obstructive sleep apnea) 07/30/2016   Low back pain 05/15/2016   Actinic keratoses 02/07/2016   Right hip pain 02/07/2016   Ischemic cardiomyopathy    Uncontrolled type 2 diabetes mellitus with hyperglycemia ([place]) 11/30/2014  Sleep disturbance 08/08/2013   CAD (coronary artery disease) 04/12/2011   HTN (hypertension) 04/12/2011   Hyperlipemia 04/12/2011   Past Medical History:  Diagnosis Date   Actinic keratoses    Angina    Asthma    BCC (basal cell carcinoma of skin) 09/06/2020   left upper back lateral (EDC) ,  left upper arm anterior (EDC), left upper back medial (EDC)    Bursitis of left elbow 2018   Diabetes mellitus without complication [facility])    ED (erectile dysfunction)    HTN (hypertension) 04/12/2011   Hyperlipemia 04/12/2011   Ischemic cardiomyopathy    a. echo 2010: EF 45-50%, mild to mod ant and apical wall HK, trace MR; b. cardiac cath  06/2012: EF 40%, mild MR    Multiple vessel coronary artery disease 04/12/2011   a. remote MI 1996; b. MI 1997 s/p PCI - LAD & diag; c. MI 2009: long stenting P-MLAD & Diag & POBA of jailed diag branch; d. cath 2010: 80% ISR of LAD w/ L-R collats, med Rx; e. cath 2012: 50-60% tub mLAD, 60-70% dLAD, FFR 0.75 -->s/p PCI dissection => w/ 2 stents (3 total); f. cath 06/2012: no changes from 2012 cath, med Rx, patent stents -> MV CAD 11/20/20 --> CABG X 5   Myocardial infarction (HCC)    x 5   Obesity    OSA (obstructive sleep apnea)    on CPAP   Osteoarthritis    Renal disorder    kidney stone    Family History  Problem Relation Age of Onset   COPD Mother    Other Father        muscle tumor   Stroke Father    Prostate cancer Father        mets   Heart disease Father    Alcoholism Father    Stroke Sister    Heart block Sister        Heart bypass   Bladder Cancer Neg Hx    Kidney cancer Neg Hx     Past Surgical History:  Procedure Laterality Date   CORONARY ANGIOPLASTY     x 5   CORONARY ARTERY BYPASS GRAFT N/A 11/20/2020   Procedure: CORONARY ARTERY BYPASS GRAFTING (CABG) x  FIVE (LIMA-dLAD, SVG-PDA, SVG-D1, SeqSVG-OM1-2) ON PUMP  USING LEFT INTERNAL MAMMARY ARTERTY AND LEFT ENDOSCOPIC GREATER SAPHENOUS VEIN CONDUITS, RIGHT LEG OPENED NOT HARVESTED;  Surgeon: Wonda Olds, MD;  Location: [facility] OR;  Service: Open Heart Surgery;  Laterality: N/A;   CORONARY STENT PLACEMENT     x 7   INTRAVASCULAR PRESSURE WIRE/FFR STUDY N/A 11/15/2020   Procedure: INTRAVASCULAR PRESSURE WIRE/FFR STUDY;  Surgeon: Nelva Bush, MD;  Location: [facility];  Service: Cardiovascular;  Laterality: N/A;   LEFT HEART CATH AND CORONARY ANGIOGRAPHY N/A 11/15/2020   Procedure: LEFT HEART CATH AND CORONARY ANGIOGRAPHY;  Surgeon: Nelva Bush, MD;  Location: [facility];  Service: Cardiovascular;  Laterality: N/A;   LEFT HEART CATHETERIZATION WITH CORONARY ANGIOGRAM N/A 04/14/2011    Procedure: LEFT HEART CATHETERIZATION WITH CORONARY ANGIOGRAM;  Surgeon: Pixie Casino, MD;  Location: [facility] CATH LAB;  Service: Cardiovascular;  Laterality: N/A;   LEFT HEART CATHETERIZATION WITH CORONARY ANGIOGRAM N/A 06/25/2012   Procedure: LEFT HEART CATHETERIZATION WITH CORONARY ANGIOGRAM;  Surgeon: Peter M Martinique, MD;  Location: [facility] CATH LAB;  Service: Cardiovascular;  Laterality: N/A;   TEE WITHOUT CARDIOVERSION N/A 11/20/2020   Procedure: TRANSESOPHAGEAL ECHOCARDIOGRAM (TEE);  Surgeon: Wonda Olds, MD;  Location: [facility] OR;  Service: Open Heart Surgery;  Laterality: N/A;   TONSILLECTOMY AND ADENOIDECTOMY  1955   Social History   Occupational History   Occupation: [employer]-retired   Occupation: Press photographer  Tobacco Use   Smoking status: Former    Packs/day: 1.50    Years: 40.00    Pack years: 60.00    Types: Cigarettes    Quit date: 09/14/2007    Years since quitting: 13.7   Smokeless tobacco: Never   Tobacco comments:    quit 2009  Vaping Use   Vaping Use: Never used  Substance and Sexual Activity   Alcohol use: No    Alcohol/week: 0.0 standard drinks    Comment: rarely 1 beer in last 5 years   Drug use: No    Types: Marijuana    Comment: pt stopped fall 2012   Sexual activity: Never

## 2021-06-07 ENCOUNTER — Ambulatory Visit (INDEPENDENT_AMBULATORY_CARE_PROVIDER_SITE_OTHER): Payer: Medicare Other

## 2021-06-07 ENCOUNTER — Other Ambulatory Visit: Payer: Self-pay

## 2021-06-07 VITALS — Ht 68.0 in | Wt 183.0 lb

## 2021-06-07 DIAGNOSIS — Z86010 Personal history of colonic polyps: Secondary | ICD-10-CM

## 2021-06-07 DIAGNOSIS — Z0000 Encounter for general adult medical examination without abnormal findings: Secondary | ICD-10-CM | POA: Diagnosis not present

## 2021-06-07 DIAGNOSIS — Z1211 Encounter for screening for malignant neoplasm of colon: Secondary | ICD-10-CM

## 2021-06-07 MED ORDER — SUTAB 1479-225-188 MG PO TABS: 12.0000 | ORAL_TABLET | Freq: Once | ORAL | 0 refills | Status: AC

## 2021-06-07 NOTE — Patient Instructions (Addendum)
[name] , Thank you for taking time to come for your Medicare Wellness Visit. I appreciate your ongoing commitment to your health goals. Please review the following plan we discussed and let me know if I can assist you in the future.   These are the goals we discussed:  Goals       Patient Stated     DIET - REDUCE PORTION SIZE (pt-stated)      Low carb foods         This is a list of the screening recommended for you and due dates:  Health Maintenance  Topic Date Due   Colon Cancer Screening  11/08/2016   Eye exam for diabetics  06/07/2021*   COVID-19 Vaccine (4 - Booster for Moderna series) 06/23/2021*   Zoster (Shingles) Vaccine (1 of 2) 09/05/2021*   Tetanus Vaccine  06/07/2022*   Hemoglobin A1C  09/17/2021   Complete foot exam   03/20/2022   Pneumonia Vaccine  Completed   Flu Shot  Completed   Hepatitis C Screening: USPSTF Recommendation to screen - Ages 18-79 yo.  Completed   HPV Vaccine  Aged Out  *Topic was postponed. The date shown is not the original due date.     Colonoscopy, Adult A colonoscopy is a procedure to look at the entire large intestine. This procedure is done using a long, thin, flexible tube that has a camera on the end. You may have a colonoscopy: As a part of normal colorectal screening. If you have certain symptoms, such as: A low number of red blood cells in your blood (anemia). Diarrhea that does not go away. Pain in your abdomen. Blood in your stool. A colonoscopy can help screen for and diagnose medical problems, including: Tumors. Extra tissue that grows where mucus forms (polyps). Inflammation. Areas of bleeding. Tell your health care provider about: Any allergies you have. All medicines you are taking, including vitamins, herbs, eye drops, creams, and over-the-counter medicines. Any problems you or family members have had with anesthetic medicines. Any blood disorders you have. Any surgeries you have had. Any medical  conditions you have. Any problems you have had with having bowel movements. Whether you are pregnant or may be pregnant. What are the risks? Generally, this is a safe procedure. However, problems may occur, including: Bleeding. Damage to your intestine. Allergic reactions to medicines given during the procedure. Infection. This is rare. What happens before the procedure? Eating and drinking restrictions Follow instructions from your health care provider about eating or drinking restrictions, which may include: A few days before the procedure: Follow a low-fiber diet. Avoid nuts, seeds, dried fruit, raw fruits, and vegetables. 1-3 days before the procedure: Eat only gelatin dessert or ice pops. Drink only clear liquids, such as water, clear juice, clear broth or bouillon, black coffee or tea, or clear soft drinks or sports drinks. Avoid liquids that contain red or purple dye. The day of the procedure: Do not eat solid foods. You may continue to drink clear liquids until up to 2 hours before the procedure. Do not eat or drink anything starting 2 hours before the procedure, or within the time period that your health care provider recommends. Bowel prep If you were prescribed a bowel prep to take by mouth (orally) to clean out your colon: Take it as told by your health care provider. Starting the day before your procedure, you will need to drink a large amount of liquid medicine. The liquid will cause you to have many  bowel movements of loose stool until your stool becomes almost clear or light green. If your skin or the opening between the buttocks (anus) gets irritated from diarrhea, you may relieve the irritation using: Wipes with medicine in them, such as adult wet wipes with aloe and vitamin E. A product to soothe skin, such as petroleum jelly. If you vomit while drinking the bowel prep: Take a break for up to 60 minutes. Begin the bowel prep again. Call your health care provider if  you keep vomiting or you cannot take the bowel prep without vomiting. To clean out your colon, you may also be given: Laxative medicines. These help you have a bowel movement. Instructions for enema use. An enema is liquid medicine injected into your rectum. Medicines Ask your health care provider about: Changing or stopping your regular medicines or supplements. This is especially important if you are taking iron supplements, diabetes medicines, or blood thinners. Taking medicines such as aspirin and ibuprofen. These medicines can thin your blood. Do not take these medicines unless your health care provider tells you to take them. Taking over-the-counter medicines, vitamins, herbs, and supplements. General instructions Ask your health care provider what steps will be taken to help prevent infection. These may include washing skin with a germ-killing soap. Plan to have someone take you home from the hospital or clinic. What happens during the procedure?  An IV will be inserted into one of your veins. You may be given one or more of the following: A medicine to help you relax (sedative). A medicine to numb the area (local anesthetic). A medicine to make you fall asleep (general anesthetic). This is rarely needed. You will lie on your side with your knees bent. The tube will: Have oil or gel put on it (be lubricated). Be inserted into your anus. Be gently eased through all parts of your large intestine. Air will be sent into your colon to keep it open. This may cause some pressure or cramping. Images will be taken with the camera and will appear on a screen. A small tissue sample may be removed to be looked at under a microscope (biopsy). The tissue may be sent to a lab for testing if any signs of problems are found. If small polyps are found, they may be removed and checked for cancer cells. When the procedure is finished, the tube will be removed. The procedure may vary among health care  providers and hospitals. What happens after the procedure? Your blood pressure, heart rate, breathing rate, and blood oxygen level will be monitored until you leave the hospital or clinic. You may have a small amount of blood in your stool. You may pass gas and have mild cramping or bloating in your abdomen. This is caused by the air that was used to open your colon during the exam. Do not drive for 24 hours after the procedure. It is up to you to get the results of your procedure. Ask your health care provider, or the department that is doing the procedure, when your results will be ready. Summary A colonoscopy is a procedure to look at the entire large intestine. Follow instructions from your health care provider about eating and drinking before the procedure. If you were prescribed an oral bowel prep to clean out your colon, take it as told by your health care provider. During the colonoscopy, a flexible tube with a camera on its end is inserted into the anus and then passed into the other parts  of the large intestine. This information is not intended to replace advice given to you by your health care provider. Make sure you discuss any questions you have with your health care provider. Document Revised: 11/19/2018 Document Reviewed: 11/19/2018 Elsevier Patient Education  [place].

## 2021-06-07 NOTE — Progress Notes (Unsigned)
Gastroenterology Pre-Procedure Review  Request Date: 07/01/2021 Requesting Physician: Dr. Vicente Males  PATIENT REVIEW QUESTIONS: The patient responded to the following health history questions as indicated:    1. Are you having any GI issues? no 2. Do you have a personal history of Polyps? yes (last colonoscopy) 3. Do you have a family history of Colon Cancer or Polyps? yes (prostate cancer ) 4. Diabetes Mellitus? yes (type 2) 5. Joint replacements in the past 12 months?no 6. Major health problems in the past 3 months?no 7. Any artificial heart valves, MVP, or defibrillator?no    MEDICATIONS & ALLERGIES:    Patient reports the following regarding taking any anticoagulation/antiplatelet therapy:   Plavix, Coumadin, Eliquis, Xarelto, Lovenox, Pradaxa, Brilinta, or Effient? yes (coventalol) Aspirin? yes (325)  Patient confirms/reports the following medications:  Current Outpatient Medications  Medication Sig Dispense Refill   aspirin EC 325 MG EC tablet Take 1 tablet (325 mg total) by mouth daily. 30 tablet 0   atorvastatin (LIPITOR) 80 MG tablet Take 1 tablet (80 mg total) by mouth daily. 90 tablet 3   carvedilol (COREG) 6.25 MG tablet Take 1 tablet (6.25 mg total) by mouth 2 (two) times daily with a meal. 180 tablet 3   diphenhydramine-acetaminophen (TYLENOL PM) 25-500 MG TABS tablet Take 2 tablets by mouth at bedtime.     Dulaglutide (TRULICITY) 8.92 JJ/9.4RD SOPN Inject 0.75 mg into the skin once a week. 6 mL 1   ezetimibe (ZETIA) 10 MG tablet Take 1 tablet (10 mg total) by mouth daily. 90 tablet 3   furosemide (LASIX) 40 MG tablet Take 1 tablet (40 mg total) by mouth daily. 90 tablet 3   isosorbide mononitrate (IMDUR) 30 MG 24 hr tablet Take 1 tablet (30 mg total) by mouth daily. 90 tablet 3   JARDIANCE 25 MG TABS tablet TAKE 1 TABLET DAILY 90 tablet 3   losartan (COZAAR) 25 MG tablet Take 1 tablet (25 mg total) by mouth daily. 90 tablet 3   metFORMIN (GLUCOPHAGE) 1000 MG tablet TAKE 1  TABLET TWICE A DAY WITH A MEAL 180 tablet 3   Multiple Vitamins-Minerals (MULTIVITAMINS THER. W/MINERALS) TABS Take 1 tablet by mouth every morning.     niacin (NIASPAN) 1000 MG CR tablet TAKE 2 TABLETS DAILY AT BEDTIME 180 tablet 2   potassium chloride SA (KLOR-CON) 20 MEQ tablet Take 1 tablet (20 mEq total) by mouth daily. 90 tablet 3   No current facility-administered medications for this visit.    Patient confirms/reports the following allergies:  Allergies  Allergen Reactions   Hydroxyzine Hcl Other (See Comments)    Weakness, out of this world feeling. sluggishness   Latex Rash    I.e. Elastic= rash to blisters if worn for an extended time Patient show effects after long exposure.    No orders of the defined types were placed in this encounter.   AUTHORIZATION INFORMATION Primary Insurance: 1D#: Group #:  Secondary Insurance: 1D#: Group #:  SCHEDULE INFORMATION: Date: 07/01/2021 Time: Location:[facility]

## 2021-06-07 NOTE — Progress Notes (Addendum)
Subjective:   [name] is a 71 y.o. male who presents for Medicare Annual/Subsequent preventive examination.  Review of Systems    No ROS.  Medicare Wellness Virtual Visit.  Visual/audio telehealth visit, UTA vital signs.   See social history for additional risk factors.                                             Cardiac Risk Factors include: advanced age (>73men, >35 women);hypertension;diabetes mellitus;male gender     Objective:    Today's Vitals   06/07/21 0832  Weight: 183 lb (83 kg)  Height: 5\' 8"  (1.727 m)   Body mass index is 27.83 kg/m.  Advanced Directives 06/07/2021 12/26/2020 11/15/2020 06/06/2020 09/16/2019 06/12/2019 06/06/2019  Does Patient Have a Medical Advance Directive? No No Yes No No No No  Type of Advance Directive - Healthcare Power of [place];Living will Living will - - - -  Does patient want to make changes to medical advance directive? - Yes (MAU/Ambulatory/Procedural Areas - Information given) No - Patient declined - - - -  Copy of [place] in Chart? - - No - copy requested - - - -  Would patient like information on creating a medical advance directive? No - Patient declined - - No - Patient declined - - Yes (MAU/Ambulatory/Procedural Areas - Information given)  Pre-existing out of facility DNR order (yellow form or pink MOST form) - - - - - - -    Current Medications (verified) Outpatient Encounter Medications as of 06/07/2021  Medication Sig   aspirin EC 325 MG EC tablet Take 1 tablet (325 mg total) by mouth daily.   atorvastatin (LIPITOR) 80 MG tablet Take 1 tablet (80 mg total) by mouth daily.   carvedilol (COREG) 6.25 MG tablet Take 1 tablet (6.25 mg total) by mouth 2 (two) times daily with a meal.   diphenhydramine-acetaminophen (TYLENOL PM) 25-500 MG TABS tablet Take 2 tablets by mouth at bedtime.   Dulaglutide (TRULICITY) 3.82 NK/5.3ZJ SOPN Inject 0.75 mg into the skin once a week.   ezetimibe (ZETIA) 10 MG tablet  Take 1 tablet (10 mg total) by mouth daily.   furosemide (LASIX) 40 MG tablet Take 1 tablet (40 mg total) by mouth daily.   isosorbide mononitrate (IMDUR) 30 MG 24 hr tablet Take 1 tablet (30 mg total) by mouth daily.   JARDIANCE 25 MG TABS tablet TAKE 1 TABLET DAILY   losartan (COZAAR) 25 MG tablet Take 1 tablet (25 mg total) by mouth daily.   metFORMIN (GLUCOPHAGE) 1000 MG tablet TAKE 1 TABLET TWICE A DAY WITH A MEAL   Multiple Vitamins-Minerals (MULTIVITAMINS THER. W/MINERALS) TABS Take 1 tablet by mouth every morning.   niacin (NIASPAN) 1000 MG CR tablet TAKE 2 TABLETS DAILY AT BEDTIME   potassium chloride SA (KLOR-CON) 20 MEQ tablet Take 1 tablet (20 mEq total) by mouth daily.   No facility-administered encounter medications on file as of 06/07/2021.    Allergies (verified) Hydroxyzine hcl and Latex   History: Past Medical History:  Diagnosis Date   Actinic keratoses    Angina    Asthma    BCC (basal cell carcinoma of skin) 09/06/2020   left upper back lateral (EDC) ,  left upper arm anterior (EDC), left upper back medial (EDC)    Bursitis of left elbow 2018   Diabetes mellitus  without complication [facility])    ED (erectile dysfunction)    HTN (hypertension) 04/12/2011   Hyperlipemia 04/12/2011   Ischemic cardiomyopathy    a. echo 2010: EF 45-50%, mild to mod ant and apical wall HK, trace MR; b. cardiac cath 06/2012: EF 40%, mild MR    Multiple vessel coronary artery disease 04/12/2011   a. remote MI 1996; b. MI 1997 s/p PCI - LAD & diag; c. MI 2009: long stenting P-MLAD & Diag & POBA of jailed diag branch; d. cath 2010: 80% ISR of LAD w/ L-R collats, med Rx; e. cath 2012: 50-60% tub mLAD, 60-70% dLAD, FFR 0.75 -->s/p PCI dissection => w/ 2 stents (3 total); f. cath 06/2012: no changes from 2012 cath, med Rx, patent stents -> MV CAD 11/20/20 --> CABG X 5   Myocardial infarction (HCC)    x 5   Obesity    OSA (obstructive sleep apnea)    on CPAP   Osteoarthritis    Renal disorder     kidney stone   Past Surgical History:  Procedure Laterality Date   CORONARY ANGIOPLASTY     x 5   CORONARY ARTERY BYPASS GRAFT N/A 11/20/2020   Procedure: CORONARY ARTERY BYPASS GRAFTING (CABG) x  FIVE (LIMA-dLAD, SVG-PDA, SVG-D1, SeqSVG-OM1-2) ON PUMP  USING LEFT INTERNAL MAMMARY ARTERTY AND LEFT ENDOSCOPIC GREATER SAPHENOUS VEIN CONDUITS, RIGHT LEG OPENED NOT HARVESTED;  Surgeon: Wonda Olds, MD;  Location: [place];  Service: Open Heart Surgery;  Laterality: N/A;   CORONARY STENT PLACEMENT     x 7   INTRAVASCULAR PRESSURE WIRE/FFR STUDY N/A 11/15/2020   Procedure: INTRAVASCULAR PRESSURE WIRE/FFR STUDY;  Surgeon: Nelva Bush, MD;  Location: [name] [facility];  Service: Cardiovascular;  Laterality: N/A;   LEFT HEART CATH AND CORONARY ANGIOGRAPHY N/A 11/15/2020   Procedure: LEFT HEART CATH AND CORONARY ANGIOGRAPHY;  Surgeon: Nelva Bush, MD;  Location: [place] [facility];  Service: Cardiovascular;  Laterality: N/A;   LEFT HEART CATHETERIZATION WITH CORONARY ANGIOGRAM N/A 04/14/2011   Procedure: LEFT HEART CATHETERIZATION WITH CORONARY ANGIOGRAM;  Surgeon: Pixie Casino, MD;  Location: [facility] CATH LAB;  Service: Cardiovascular;  Laterality: N/A;   LEFT HEART CATHETERIZATION WITH CORONARY ANGIOGRAM N/A 06/25/2012   Procedure: LEFT HEART CATHETERIZATION WITH CORONARY ANGIOGRAM;  Surgeon: Peter M Martinique, MD;  Location: [facility] CATH LAB;  Service: Cardiovascular;  Laterality: N/A;   TEE WITHOUT CARDIOVERSION N/A 11/20/2020   Procedure: TRANSESOPHAGEAL ECHOCARDIOGRAM (TEE);  Surgeon: Wonda Olds, MD;  Location: [place];  Service: Open Heart Surgery;  Laterality: N/A;   TONSILLECTOMY AND ADENOIDECTOMY  1955   Family History  Problem Relation Age of Onset   COPD Mother    Other Father        muscle tumor   Stroke Father    Prostate cancer Father        mets   Heart disease Father    Alcoholism Father    Stroke Sister    Heart block Sister        Heart bypass   Bladder Cancer  Neg Hx    Kidney cancer Neg Hx    Social History   Socioeconomic History   Marital status: Married    Spouse name: Not on file   Number of children: 2   Years of education: Not on file   Highest education level: Not on file  Occupational History   Occupation: [employer]-retired   Occupation: Press photographer  Tobacco Use   Smoking status: Former  Packs/day: 1.50    Years: 40.00    Pack years: 60.00    Types: Cigarettes    Quit date: 09/14/2007    Years since quitting: 13.7   Smokeless tobacco: Never   Tobacco comments:    quit 2009  Vaping Use   Vaping Use: Never used  Substance and Sexual Activity   Alcohol use: No    Alcohol/week: 0.0 standard drinks    Comment: rarely 1 beer in last 5 years   Drug use: No    Types: Marijuana    Comment: pt stopped fall 2012   Sexual activity: Never  Other Topics Concern   Not on file  Social History Narrative   Retired twice    Working with rescue dogs    Lives with girlfriend   Pet: 1 snake ([place] Bull snake), 3 dogs    Caffeine- coffee 3 cups a day, tea- unsweet    Social Determinants of Health   Financial Resource Strain: Low Risk    Difficulty of Paying Living Expenses: Not hard at all  Food Insecurity: No Food Insecurity   Worried About Charity fundraiser in the Last Year: Never true   Slaughters in the Last Year: Never true  Transportation Needs: No Transportation Needs   Lack of Transportation (Medical): No   Lack of Transportation (Non-Medical): No  Physical Activity: Sufficiently Active   Days of Exercise per Week: 4 days   Minutes of Exercise per Session: 60 min  Stress: No Stress Concern Present   Feeling of Stress : Not at all  Social Connections: Unknown   Frequency of Communication with Friends and Family: Not on file   Frequency of Social Gatherings with Friends and Family: Not on file   Attends Religious Services: Not on file   Active Member of Clubs or Organizations: Not on file   Attends Archivist  Meetings: Not on file   Marital Status: Married    Tobacco Counseling Counseling given: Not Answered Tobacco comments: quit 2009   Clinical Intake:  Pre-visit preparation completed: Yes        Diabetes: Yes (Followed by PCP)  How often do you need to have someone help you when you read instructions, pamphlets, or other written materials from your doctor or pharmacy?: 1 - Never  Nutrition Risk Assessment: Does the patient have any non-healing wounds?  No   Diabetes Management: Followed by PCP  Activities of Daily Living In your present state of health, do you have any difficulty performing the following activities: 06/07/2021 11/16/2020  Hearing? N N  Vision? N N  Difficulty concentrating or making decisions? N N  Walking or climbing stairs? N N  Comment Paces self when walking or climbing stairs -  Dressing or bathing? N N  Doing errands, shopping? N N  Preparing Food and eating ? N -  Using the Toilet? N -  In the past six months, have you accidently leaked urine? N -  Do you have problems with loss of bowel control? N -  Managing your Medications? N -  Managing your Finances? N -  Housekeeping or managing your Housekeeping? N -  Some recent data might be hidden    Patient Care Team: [name], MD as PCP - General (Family Medicine) Rockey Situ Kathlene November, MD as PCP - Cardiology (Cardiology) Minna Merritts, MD as Consulting Physician (Cardiology) [name], MD as Consulting Physician (Family Medicine) Christene Lye, MD (General Surgery)  Indicate any recent Medical Services you may have received from other than [facility] providers in the past year (date may be approximate).     Assessment:   This is a routine wellness examination for [place].  Virtual Visit via Telephone Note  I connected with  [name] on 06/07/21 at  8:15 AM EST by telephone and verified that I am speaking with the correct person using two  identifiers.  Persons participating in the virtual visit: patient/Nurse Health Advisor   I discussed the limitations, risks, security and privacy concerns of performing an evaluation and management service by telephone and the availability of in person appointments. The patient expressed understanding and agreed to proceed.  Interactive audio and video telecommunications were attempted between this nurse and patient, however failed, due to patient having technical difficulties OR patient did not have access to video capability.  We continued and completed visit with audio only.  Some vital signs may be absent or patient reported.   Hearing/Vision screen Hearing Screening - Comments:: Patient is able to hear conversational tones without difficulty. No issues reported. Vision Screening - Comments:: Wears corrective lenses    Dietary issues and exercise activities discussed: He tries to eat a low carb diet.    Goals Addressed               This Visit's Progress     Patient Stated     DIET - REDUCE PORTION SIZE (pt-stated)   On track     Low carb foods        Depression Screen PHQ 2/9 Scores 06/07/2021 03/12/2021 12/31/2020 06/06/2020 05/29/2020 10/18/2019 06/06/2019  PHQ - 2 Score 0 0 0 0 0 0 0  PHQ- 9 Score - 0 3 - - - -    Fall Risk Fall Risk  06/07/2021 03/20/2021 12/26/2020 06/06/2020 05/29/2020  Falls in the past year? 0 0 0 0 0  Comment - - - - -  Number falls in past yr: 0 0 0 0 0  Comment - - - - -  Injury with Fall? - 0 0 0 -  Risk for fall due to : - No Fall Risks Impaired balance/gait - -  Follow up Falls evaluation completed Falls evaluation completed Falls evaluation completed;Education provided;Falls prevention discussed Falls evaluation completed Falls evaluation completed    FALL RISK PREVENTION PERTAINING TO THE HOME: Home free of loose throw rugs in walkways, pet beds, electrical cords, etc? Yes  Adequate lighting in your home to reduce risk of falls? Yes    ASSISTIVE DEVICES UTILIZED TO PREVENT FALLS: Life alert? No  Use of a cane, walker or w/c? No  Grab bars in the bathroom? No  Shower chair or bench in shower? No  Elevated toilet seat or a handicapped toilet? No   TIMED UP AND GO: Was the test performed? No .   Cognitive Function: Patient is alert and oriented x3.  Enjoys reading, playing Jeopardy and other brain health games for stimulation.  MMSE - Mini Mental State Exam 06/03/2018  Orientation to time 5  Orientation to Place 5  Registration 3  Attention/ Calculation 5  Recall 3  Language- name 2 objects 2  Language- repeat 1  Language- follow 3 step command 3  Language- read & follow direction 1  Write a sentence 1  Copy design 1  Total score 30     6CIT Screen 06/06/2019  What Year? 0 points  What month? 0 points  What time? 0 points  Count back from 20 0 points  Months in reverse 0 points  Repeat phrase 0 points  Total Score 0    Immunizations Immunization History  Administered Date(s) Administered   Fluad Quad(high Dose 65+) 03/20/2021   Influenza, High Dose Seasonal PF 02/07/2016, 01/26/2018   Influenza,inj,Quad PF,6+ Mos 03/13/2014, 02/26/2015   Moderna Sars-Covid-2 Vaccination 08/08/2019, 09/05/2019, 03/12/2020   Pneumococcal Conjugate-13 06/03/2018   Pneumococcal Polysaccharide-23 03/20/2021   Zoster, Live 05/13/2011    TDAP status: Due, Education has been provided regarding the importance of this vaccine. Advised may receive this vaccine at local pharmacy or Health Dept. Aware to provide a copy of the vaccination record if obtained from local pharmacy or Health Dept. Verbalized acceptance and understanding. Deferred.  Shingrix Completed?: No.    Education has been provided regarding the importance of this vaccine. Patient has been advised to call insurance company to determine out of pocket expense if they have not yet received this vaccine. Advised may also receive vaccine at local pharmacy or Health  Dept. Verbalized acceptance and understanding.  Screening Tests Health Maintenance  Topic Date Due   COLONOSCOPY (Pts 45-18yrs Insurance coverage will need to be confirmed)  11/08/2016   OPHTHALMOLOGY EXAM  06/07/2021 (Originally 11/24/2019)   COVID-19 Vaccine (4 - Booster for Moderna series) 06/23/2021 (Originally 05/07/2020)   Zoster Vaccines- Shingrix (1 of 2) 09/05/2021 (Originally 11/20/1969)   TETANUS/TDAP  06/07/2022 (Originally 02/25/2021)   HEMOGLOBIN A1C  09/17/2021   FOOT EXAM  03/20/2022   Pneumonia Vaccine 70+ Years old  Completed   INFLUENZA VACCINE  Completed   Hepatitis C Screening  Completed   HPV VACCINES  Aged Out   Health Maintenance Health Maintenance Due  Topic Date Due   COLONOSCOPY (Pts 45-80yrs Insurance coverage will need to be confirmed)  11/08/2016   Colonoscopy- ordered per patient.   DG Chest 2 View- completed 12/24/20.   Vision Screening: Recommended annual ophthalmology exams for early detection of glaucoma and other disorders of the eye. Appoint[facility] 08/2021.  Dental Screening: Recommended annual dental exams for proper oral hygiene  Community Resource Referral / Chronic Care Management: CRR required this visit?  No   CCM required this visit?  No      Plan:   Keep all routine maintenance appointments.   I have personally reviewed and noted the following in the patients chart:   Medical and social history Use of alcohol, tobacco or illicit drugs  Current medications and supplements including opioid prescriptions. Patient is not currently taking opioid prescriptions. Functional ability and status Nutritional status Physical activity Advanced directives List of other physicians Hospitalizations, surgeries, and ER visits in previous 12 months Vitals Screenings to include cognitive, depression, and falls Referrals and appointments  In addition, I have reviewed and discussed with patient certain  preventive protocols, quality metrics, and best practice recommendations. A written personalized care plan for preventive services as well as general preventive health recommendations were provided to patient.     OBrien-Blaney, Lillianna Sabel L, LPN   0/96/2836   I have reviewed the above information and agree with above.   Deborra Medina, MD

## 2021-06-17 ENCOUNTER — Telehealth: Payer: Self-pay

## 2021-06-17 MED ORDER — SUTAB 1479-225-188 MG PO TABS: 12.0000 | ORAL_TABLET | Freq: Once | ORAL | 0 refills | Status: AC

## 2021-06-17 NOTE — Telephone Encounter (Signed)
Patient called and left a voicemail and states that he needs to reschedule his colonoscopy schedule for 07/01/21 with Dr. Vicente Males. [facility] also [facility]

## 2021-06-17 NOTE — Telephone Encounter (Signed)
[facility] and rescheduled colonoscopy for patient

## 2021-06-26 DIAGNOSIS — Z20822 Contact with and (suspected) exposure to covid-19: Secondary | ICD-10-CM | POA: Diagnosis not present

## 2021-07-02 ENCOUNTER — Encounter: Payer: Self-pay | Admitting: Family Medicine

## 2021-07-02 ENCOUNTER — Ambulatory Visit (INDEPENDENT_AMBULATORY_CARE_PROVIDER_SITE_OTHER): Payer: Medicare Other | Admitting: Family Medicine

## 2021-07-02 ENCOUNTER — Other Ambulatory Visit: Payer: Self-pay

## 2021-07-02 VITALS — BP 121/80 | HR 78 | Temp 98.1°F | Ht 68.0 in | Wt 189.2 lb

## 2021-07-02 DIAGNOSIS — H903 Sensorineural hearing loss, bilateral: Secondary | ICD-10-CM

## 2021-07-02 DIAGNOSIS — R252 Cramp and spasm: Secondary | ICD-10-CM | POA: Diagnosis not present

## 2021-07-02 DIAGNOSIS — M25552 Pain in left hip: Secondary | ICD-10-CM

## 2021-07-02 DIAGNOSIS — R361 Hematospermia: Secondary | ICD-10-CM

## 2021-07-02 DIAGNOSIS — E1165 Type 2 diabetes mellitus with hyperglycemia: Secondary | ICD-10-CM | POA: Diagnosis not present

## 2021-07-02 DIAGNOSIS — Z125 Encounter for screening for malignant neoplasm of prostate: Secondary | ICD-10-CM | POA: Diagnosis not present

## 2021-07-02 DIAGNOSIS — H9190 Unspecified hearing loss, unspecified ear: Secondary | ICD-10-CM | POA: Insufficient documentation

## 2021-07-02 LAB — BASIC METABOLIC PANEL
BUN: 24 mg/dL — ABNORMAL HIGH (ref 6–23)
CO2: 27 mEq/L (ref 19–32)
Calcium: 9.6 mg/dL (ref 8.4–10.5)
Chloride: 103 mEq/L (ref 96–112)
Creatinine, Ser: 0.95 mg/dL (ref 0.40–1.50)
GFR: 81.04 mL/min (ref >60.00)
Glucose, Bld: 197 mg/dL — ABNORMAL HIGH (ref 70–99)
Potassium: 4.2 mEq/L (ref 3.5–5.1)
Sodium: 137 mEq/L (ref 135–145)

## 2021-07-02 LAB — MAGNESIUM: Magnesium: 2.4 mg/dL (ref 1.5–2.5)

## 2021-07-02 LAB — PSA, MEDICARE: PSA: 7.37 ng/ml — ABNORMAL HIGH (ref 0.10–4.00)

## 2021-07-02 LAB — HEMOGLOBIN A1C: Hgb A1c MFr Bld: 9.5 % — ABNORMAL HIGH (ref 4.6–6.5)

## 2021-07-02 NOTE — Assessment & Plan Note (Signed)
The patient likely has sensorineural hearing loss bilaterally given his description and his history of noise exposure.  Discussed the option of seeing an audiologist versus monitoring.  He opts to monitor at this time.  If he notices any unilateral symptoms or worsening symptoms he will let us know right away.

## 2021-07-02 NOTE — Progress Notes (Signed)
[name], MD Phone: [phone]  [name] is a 71 y.o. male who presents today for f/u.  DIABETES Disease Monitoring: Blood Sugar ranges-100-150 fasting Polyuria/phagia/dipsia- no      Optho- scheduled in April Medications: Compliance- taking trulicity, jardiance, metformin Hypoglycemic symptoms- only if he doesn't eat when he is supposed to  Hearing loss: Patient notes this has been an ongoing issue.  Left slightly greater than right.  No ear fullness.  He has bilateral tinnitus.  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  He notes this is not that bothersome at this time.  Blood in the semen: He noticed this a year ago.  It has been a recurrent issue.  Notes it will start out bright red and then become darker with time and then eventually return back to being bright red.  There is no pain with ejaculation.  No erectile pain either.  Left hip pain: He had to stop doing his 2 mile walks due to left lateral hip pain.  He saw orthopedics and received 2 shots at what sounds to be the bursa.  He notes the first 1 worked really well and the second 1 worked well but not quite as well.  He we will follow-up with orthopedics as needed.  Foot cramps: He was having these quite badly previously.  He started a magnesium supplement and they have been improving.  He reports his colonoscopy is scheduled for March.   Social History   Tobacco Use  Smoking Status Former   Packs/day: 1.50   Years: 40.00   Pack years: 60.00   Types: Cigarettes   Quit date: 09/14/2007   Years since quitting: 13.8  Smokeless Tobacco Never  Tobacco Comments   quit 2009    Current Outpatient Medications on File Prior to Visit  Medication Sig Dispense Refill   aspirin EC 325 MG EC tablet Take 1 tablet (325 mg total) by mouth daily. 30 tablet 0   atorvastatin (LIPITOR) 80 MG tablet Take 1 tablet (80 mg total) by mouth daily. 90 tablet 3   carvedilol (COREG) 6.25  MG tablet Take 1 tablet (6.25 mg total) by mouth 2 (two) times daily with a meal. 180 tablet 3   diphenhydramine-acetaminophen (TYLENOL PM) 25-500 MG TABS tablet Take 2 tablets by mouth at bedtime.     Dulaglutide (TRULICITY) 3.38 SN/0.5LZ SOPN Inject 0.75 mg into the skin once a week. 6 mL 1   ezetimibe (ZETIA) 10 MG tablet Take 1 tablet (10 mg total) by mouth daily. 90 tablet 3   furosemide (LASIX) 40 MG tablet Take 1 tablet (40 mg total) by mouth daily. 90 tablet 3   isosorbide mononitrate (IMDUR) 30 MG 24 hr tablet Take 1 tablet (30 mg total) by mouth daily. 90 tablet 3   JARDIANCE 25 MG TABS tablet TAKE 1 TABLET DAILY 90 tablet 3   metFORMIN (GLUCOPHAGE) 1000 MG tablet TAKE 1 TABLET TWICE A DAY WITH A MEAL 180 tablet 3   Multiple Vitamins-Minerals (MULTIVITAMINS THER. W/MINERALS) TABS Take 1 tablet by mouth every morning.     niacin (NIASPAN) 1000 MG CR tablet TAKE 2 TABLETS DAILY AT BEDTIME 180 tablet 2   potassium chloride SA (KLOR-CON) 20 MEQ tablet Take 1 tablet (20 mEq total) by mouth daily. 90 tablet 3   losartan (COZAAR) 25 MG tablet Take 1 tablet (25 mg total) by mouth daily. 90 tablet 3   prasugrel (EFFIENT) 10 MG TABS  tablet Take 10 mg by mouth daily.     No current facility-administered medications on file prior to visit.     ROS see history of present illness  Objective  Physical Exam Vitals:   07/02/21 0941  BP: 121/80  Pulse: 78  Temp: 98.1 F (36.7 C)  SpO2: 97%    BP Readings from Last 3 Encounters:  07/02/21 121/80  03/20/21 110/70  03/11/21 122/70   Wt Readings from Last 3 Encounters:  07/02/21 189 lb 3.2 oz (85.8 kg)  06/07/21 183 lb (83 kg)  06/03/21 183 lb 12.8 oz (83.4 kg)    Physical Exam Constitutional:      General: He is not in acute distress.    Appearance: He is not diaphoretic.  HENT:     Right Ear: Tympanic membrane and ear canal normal.     Left Ear: Tympanic membrane and ear canal normal.  Cardiovascular:     Rate and Rhythm:  Normal rate and regular rhythm.     Heart sounds: Normal heart sounds.  Pulmonary:     Effort: Pulmonary effort is normal.     Breath sounds: Normal breath sounds.  Musculoskeletal:     Comments: Good internal/external range of motion left hip, no pain with internal or external range of motion, minimal discomfort on palpation of his left greater trochanter area  Skin:    General: Skin is warm and dry.  Neurological:     Mental Status: He is alert.     Assessment/Plan: Please see individual problem list.  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  Discussed the option of seeing an audiologist versus monitoring.  He opts to monitor at this time.  If he notices any unilateral symptoms or worsening symptoms he will let us know right away.      Uncontrolled type 2 diabetes mellitus with hyperglycemia (HCC) (Chronic)    CBGs seem to be adequately controlled.  We will check an A1c.  He will continue Jardiance 25 mg once daily, metformin 1000 mg twice daily, and Trulicity 4.08 mg weekly.      Relevant Orders   Basic Metabolic Panel (BMET)   HgB A1c   Blood in semen    Ongoing issue with this over the last year.  We will refer to urology for further evaluation.  We will check a PSA for prostate cancer screening.      Relevant Orders   PSA, Medicare ( [facility] only)   Ambulatory referral to Urology   Foot cramps    Improved with adding magnesium.  We will check a magnesium level.      Relevant Orders   Magnesium   Basic Metabolic Panel (BMET)   Left hip pain    Likely related to bursitis given the location of his injection.  This has improved at this point.  He will monitor and if it worsens he can follow-up with orthopedics.      Other Visit Diagnoses     Prostate cancer screening    -  Primary   Relevant Orders   PSA,  Medicare ( [place] [facility] only)      The patient notes he never received his losartan that his cardiologist sent him.  I discussed he should be taking this and he should contact his mail-order pharmacy.  He was advised to contact us  when he received this so we can schedule him for labs 10 days after he starts the losartan.  Return in about 6 months (around 12/30/2021).  This visit occurred during the SARS-CoV-2 public health emergency.  Safety protocols were in place, including screening questions prior to the visit, additional usage of staff PPE, and extensive cleaning of exam room while observing appropriate contact time as indicated for disinfecting solutions.    [name], MD [place]

## 2021-07-02 NOTE — Assessment & Plan Note (Signed)
Improved with adding magnesium.  We will check a magnesium level.

## 2021-07-02 NOTE — Assessment & Plan Note (Signed)
CBGs seem to be adequately controlled.  We will check an A1c.  He will continue Jardiance 25 mg once daily, metformin 1000 mg twice daily, and Trulicity 5.08 mg weekly.

## 2021-07-02 NOTE — Assessment & Plan Note (Signed)
Ongoing issue with this over the last year.  We will refer to urology for further evaluation.  We will check a PSA for prostate cancer screening.

## 2021-07-02 NOTE — Patient Instructions (Signed)
Nice to see. Please check with [facility] for your losartan prescription. We will get lab work today and contact you with results. Urology should contact you to schedule an appointment. Please monitor your hearing and if it worsens significantly or you have unilateral issues with your hearing or unilateral worsening of ringing in your ears please let us know right away.

## 2021-07-02 NOTE — Assessment & Plan Note (Signed)
Likely related to bursitis given the location of his injection.  This has improved at this point.  He will monitor and if it worsens he can follow-up with orthopedics.

## 2021-07-10 NOTE — Progress Notes (Signed)
07/12/21 8:16 AM   [name] 1950-07-01 [id number]  Referring provider:  [name], MD [address] [place],  [place] [address] Chief Complaint  Patient presents with   New Patient (Initial Visit)    Blood i     HPI: [name] is a 71 y.o.male who presents today for further evaluation of blood in semen.   He was seen by his PCP, Dr Caryl Bis, for blood in his semen that had been ongoing for over the last year on 07/02/2021. His PCP checked PSA for prostate screening which was 7.37 he had a previous PSA 7 years ago of 1.24. He was referred to Urology.   He has a significant cardiac history and is managed on Effient and aspirin.  He states today that he is no longer on Effient.  He is followed by Dr. Rockey Situ.  He reports that he has intermittent fresh blood in his sperm, then for the next 3-4 weeks it is brown in color like old blood. He has been having weak stream that is ongoing for years. He denies blood in his urine. He has never been off his aspirin   He has a family history of prostate cancer his father was diagnosed in his early 48s he had surgery for this.  He does mention that his father underwent orchiectomy.  He is unsure if it is metastatic.    PMH: Past Medical History:  Diagnosis Date   Actinic keratoses    Angina    Asthma    BCC (basal cell carcinoma of skin) 09/06/2020   left upper back lateral (EDC) ,  left upper arm anterior (EDC), left upper back medial (EDC)    Bursitis of left elbow 2018   Diabetes mellitus without complication ([place])    ED (erectile dysfunction)    HTN (hypertension) 04/12/2011   Hyperlipemia 04/12/2011   Ischemic cardiomyopathy    a. echo 2010: EF 45-50%, mild to mod ant and apical wall HK, trace MR; b. cardiac cath 06/2012: EF 40%, mild MR    Multiple vessel coronary artery disease 04/12/2011   a. remote MI 1996; b. MI 1997 s/p PCI - LAD & diag; c. MI 2009: long stenting P-MLAD & Diag & POBA  of jailed diag branch; d. cath 2010: 80% ISR of LAD w/ L-R collats, med Rx; e. cath 2012: 50-60% tub mLAD, 60-70% dLAD, FFR 0.75 -->s/p PCI dissection => w/ 2 stents (3 total); f. cath 06/2012: no changes from 2012 cath, med Rx, patent stents -> MV CAD 11/20/20 --> CABG X 5   Myocardial infarction (HCC)    x 5   Obesity    OSA (obstructive sleep apnea)    on CPAP   Osteoarthritis    Renal disorder    kidney stone    Surgical History: Past Surgical History:  Procedure Laterality Date   CORONARY ANGIOPLASTY     x 5   CORONARY ARTERY BYPASS GRAFT N/A 11/20/2020   Procedure: CORONARY ARTERY BYPASS GRAFTING (CABG) x  FIVE (LIMA-dLAD, SVG-PDA, SVG-D1, SeqSVG-OM1-2) ON PUMP  USING LEFT INTERNAL MAMMARY ARTERTY AND LEFT ENDOSCOPIC GREATER SAPHENOUS VEIN CONDUITS, RIGHT LEG OPENED NOT HARVESTED;  Surgeon: Wonda Olds, MD;  Location: [facility];  Service: Open Heart Surgery;  Laterality: N/A;   CORONARY STENT PLACEMENT     x 7   INTRAVASCULAR PRESSURE WIRE/FFR STUDY N/A 11/15/2020   Procedure: INTRAVASCULAR PRESSURE WIRE/FFR STUDY;  Surgeon: Nelva Bush, MD;  Location: [facility];  Service: Cardiovascular;  Laterality: N/A;   LEFT HEART CATH AND CORONARY ANGIOGRAPHY N/A 11/15/2020   Procedure: LEFT HEART CATH AND CORONARY ANGIOGRAPHY;  Surgeon: Nelva Bush, MD;  Location: [facility];  Service: Cardiovascular;  Laterality: N/A;   LEFT HEART CATHETERIZATION WITH CORONARY ANGIOGRAM N/A 04/14/2011   Procedure: LEFT HEART CATHETERIZATION WITH CORONARY ANGIOGRAM;  Surgeon: Pixie Casino, MD;  [facility] C[facility];  Service: Cardiovascular;  Laterality: N/A;   LEFT HEART CATHETERIZATION WITH CORONARY ANGIOGRAM N/A 06/25/2012   Procedure: LEFT HEART CATHETERIZATION WITH CORONARY ANGIOGRAM;  Surgeon: Peter M Martinique, MD;  Location: [facility] CATH LAB;  Service: Cardiovascular;  Laterality: N/A;   TEE WITHOUT CARDIOVERSION N/A 11/20/2020   Procedure: TRANSESOPHAGEAL ECHOCARDIOGRAM  (TEE);  Surgeon: Wonda Olds, MD;  Location: [facility] [place];  Service: Open Heart Surgery;  Laterality: N/A;   TONSILLECTOMY AND ADENOIDECTOMY  1955    Home Medications:  Allergies as of 07/11/2021       Reactions   Hydroxyzine Hcl Other (See Comments)   Weakness, out of this world feeling. sluggishness   Latex Rash   I.e. Elastic= rash to blisters if worn for an extended time Patient show effects after long exposure.        Medication List        Accurate as of July 11, 2021 11:59 PM. If you have any questions, ask your nurse or doctor.          STOP taking these medications    prasugrel 10 MG Tabs tablet Commonly known as: EFFIENT Stopped by: Hollice Espy, MD       TAKE these medications    aspirin 325 MG EC tablet Take 1 tablet (325 mg total) by mouth daily.   atorvastatin 80 MG tablet Commonly known as: LIPITOR Take 1 tablet (80 mg total) by mouth daily.   carvedilol 6.25 MG tablet Commonly known as: COREG Take 1 tablet (6.25 mg total) by mouth 2 (two) times daily with a meal.   diphenhydramine-acetaminophen 25-500 MG Tabs tablet Commonly known as: TYLENOL PM Take 2 tablets by mouth at bedtime.   ezetimibe 10 MG tablet Commonly known as: ZETIA Take 1 tablet (10 mg total) by mouth daily.   furosemide 40 MG tablet Commonly known as: LASIX Take 1 tablet (40 mg total) by mouth daily.   isosorbide mononitrate 30 MG 24 hr tablet Commonly known as: IMDUR Take 1 tablet (30 mg total) by mouth daily.   Jardiance 25 MG Tabs tablet Generic drug: empagliflozin TAKE 1 TABLET DAILY   losartan 25 MG tablet Commonly known as: COZAAR Take 1 tablet (25 mg total) by mouth daily.   metFORMIN 1000 MG tablet Commonly known as: GLUCOPHAGE TAKE 1 TABLET TWICE A DAY WITH A MEAL   multivitamins ther. w/minerals Tabs tablet Take 1 tablet by mouth every morning.   niacin 1000 MG CR tablet Commonly known as: NIASPAN TAKE 2 TABLETS DAILY AT BEDTIME   potassium  chloride SA 20 MEQ tablet Commonly known as: KLOR-CON M Take 1 tablet (20 mEq total) by mouth daily.   Trulicity 3.22 GU/5.4YH Sopn Generic drug: Dulaglutide Inject 0.75 mg into the skin once a week.        Allergies:  Allergies  Allergen Reactions   Hydroxyzine Hcl Other (See Comments)    Weakness, out of this world feeling. sluggishness   Latex Rash    I.e. Elastic= rash to blisters if worn for an extended time Patient show effects after long  exposure.    Family History: Family History  Problem Relation Age of Onset   COPD Mother    Other Father        muscle tumor   Stroke Father    Prostate cancer Father        mets   Heart disease Father    Alcoholism Father    Stroke Sister    Heart block Sister        Heart bypass   Bladder Cancer Neg Hx    Kidney cancer Neg Hx     Social History:  reports that he quit smoking about 13 years ago. His smoking use included cigarettes. He has a 60.00 pack-year smoking history. He has never used smokeless tobacco. He reports that he does not drink alcohol and does not use drugs.   Physical Exam: BP (!) 178/92    Pulse 83    Ht 5\' 8"  (1.727 m)    Wt 185 lb (83.9 kg)    BMI 28.13 kg/m   Constitutional:  Alert and oriented, No acute distress. HEENT: [place] AT, moist mucus membranes.  Trachea midline, no masses. Cardiovascular: No clubbing, cyanosis, or edema. Respiratory: Normal respiratory effort, no increased work of breathing. Rectal: Normal sphincter tone, grossly abnormal rectal exam with multiple nodules, primarily in the left and mid prostate.  The mots prominent nodule measure about 2 cm nodule at the left mid extending from the base down to the mid prostate as well as a 1 cm nodule just lateral to the left and inferior to this.  Highly concerning. Skin: No rashes, bruises or suspicious lesions. Neurologic: Grossly intact, no focal deficits, moving all 4 extremities. Psychiatric: Normal mood and affect.  Laboratory  Data: Lab Results  Component Value Date   CREATININE 0.95 07/02/2021   Lab Results  Component Value Date   PSA 7.37 (H) 07/02/2021   PSA 1.24 09/01/2013   Lab Results  Component Value Date   HGBA1C 9.5 (H) 07/02/2021    Urinalysis 3+ Glucose otherwise unremarkable   Assessment & Plan:   Elevated PSA/grossly abnormal rectal exam - We reviewed the implications of an elevated PSA and the uncertainty surrounding it. In general, a man's PSA increases with age and is produced by both normal and cancerous prostate tissue. Differential for elevated PSA is BPH, prostate cancer, infection, recent intercourse/ejaculation, prostate infarction, recent urethroscopic manipulation (foley placement/cystoscopy) and prostatitis. Management of an elevated PSA can include observation or prostate biopsy and wediscussed this in detail. We discussed that indications for prostate biopsy are defined by age and race specific PSA cutoffs as well as a PSA velocity of 0.75/year. - Rectal exam revealed 2 nodules the most prominent measuring about 2 cm  - Recommend he undergo prostate biopsy; he was very clear with Mr. Geurin about my concerns for prostate cancer based on his family history, abnormal rectal exam, and markedly elevated PSA - We discussed prostate biopsy in detail including the procedure itself, the risks of blood in the urine, stool, and ejaculate, serious infection, and discomfort. He is willing to proceed with this as discussed. - Needs to be cleared by cardiologist Dr. Rockey Situ for prostate biopsy  - PSA;pending   2. Hematospermia  -Generally benign however in this clinical scenario, most likely related to locally advanced prostate cancer.  Work-up as above. -No evidence of hematuria  Return for prostate biopsy   Conley Rolls as a scribe for Hollice Espy, MD.,have documented all relevant documentation on the behalf of Hollice Espy,  MD,as directed by  Hollice Espy, MD while in the  presence of Hollice Espy, MD.  I have reviewed the above documentation for accuracy and completeness, and I agree with the above.   Hollice Espy, MD   [facility] [address], [place], [place] [address] [phone]

## 2021-07-11 ENCOUNTER — Encounter: Payer: Self-pay | Admitting: Urology

## 2021-07-11 ENCOUNTER — Ambulatory Visit: Admission: RE | Admit: 2021-07-11 | Payer: Medicare Other | Source: Home / Self Care | Admitting: Gastroenterology

## 2021-07-11 ENCOUNTER — Other Ambulatory Visit: Payer: Self-pay

## 2021-07-11 ENCOUNTER — Encounter: Admission: RE | Payer: Self-pay | Source: Home / Self Care

## 2021-07-11 ENCOUNTER — Telehealth: Payer: Self-pay | Admitting: Cardiovascular Disease

## 2021-07-11 ENCOUNTER — Ambulatory Visit (INDEPENDENT_AMBULATORY_CARE_PROVIDER_SITE_OTHER): Payer: Medicare Other | Admitting: Urology

## 2021-07-11 VITALS — BP 178/92 | HR 83 | Ht 68.0 in | Wt 185.0 lb

## 2021-07-11 DIAGNOSIS — R361 Hematospermia: Secondary | ICD-10-CM

## 2021-07-11 DIAGNOSIS — R6889 Other general symptoms and signs: Secondary | ICD-10-CM | POA: Diagnosis not present

## 2021-07-11 DIAGNOSIS — R972 Elevated prostate specific antigen [PSA]: Secondary | ICD-10-CM

## 2021-07-11 LAB — URINALYSIS, COMPLETE
Bilirubin, UA: NEGATIVE
Ketones, UA: NEGATIVE
Leukocytes,UA: NEGATIVE
Nitrite, UA: NEGATIVE
Protein,UA: NEGATIVE
RBC, UA: NEGATIVE
Specific Gravity, UA: 1.015 (ref 1.005–1.030)
Urobilinogen, Ur: 0.2 mg/dL (ref 0.2–1.0)
pH, UA: 5.5 (ref 5.0–7.5)

## 2021-07-11 LAB — MICROSCOPIC EXAMINATION: RBC, Urine: NONE SEEN /hpf (ref 0–2)

## 2021-07-11 SURGERY — COLONOSCOPY WITH PROPOFOL
Anesthesia: General

## 2021-07-11 NOTE — Patient Instructions (Signed)

## 2021-07-11 NOTE — Telephone Encounter (Signed)
? ?  Pre-operative Risk Assessment  ?  ?Patient Name: [name]  ?DOB: Nov 01, 1950 ?MRN: [id number]  ? ?  ? ?Request for Surgical Clearance   ? ?Procedure:   Prostate biopsy ? ?Date of Surgery:  Clearance TBD                              ?   ?Surgeon:  not indicated  ?Surgeon's Group or Practice Name:  [facility]  ?Phone number:  [phone] ?Fax number:  [phone] ?  ?Type of Clearance Requested:   ?- Pharmacy:  Hold Aspirin 7 days prior procedure  ?  ?Type of Anesthesia:  Not Indicated ?  ?Additional requests/questions:   ? ?Signed, ?Caryl Pina Gerringer   ?07/11/2021, 4:29 PM  ? ?

## 2021-07-12 ENCOUNTER — Encounter: Payer: Self-pay | Admitting: *Deleted

## 2021-07-12 ENCOUNTER — Telehealth: Payer: Self-pay | Admitting: *Deleted

## 2021-07-12 NOTE — Telephone Encounter (Signed)
LMTCB 07/12/21 at 1104 am.  Patient needs call back. ?

## 2021-07-12 NOTE — Telephone Encounter (Signed)
Received cardiac clearance per Dr.Gollan to take low dose aspirin 7 days prior to prostate biopsy and hold aspirin day of procedure. Patient informed, voiced understanding. Sent reminder thru mychart per patient requests.  ?

## 2021-07-15 NOTE — Telephone Encounter (Signed)
Preoperative team, please contact this patient and set up a phone call appointment for further cardiac evaluation.  Thank you for your help. ? ?Jossie Ng. Teondre Jarosz NP-C ? ?  ?07/15/2021, 3:10 PM ?[place] [address] ?Office [phone] Fax [phone] ? ?

## 2021-07-16 ENCOUNTER — Telehealth: Payer: Self-pay | Admitting: *Deleted

## 2021-07-16 ENCOUNTER — Ambulatory Visit (INDEPENDENT_AMBULATORY_CARE_PROVIDER_SITE_OTHER): Payer: Medicare Other | Admitting: General Practice

## 2021-07-16 DIAGNOSIS — Z01810 Encounter for preprocedural cardiovascular examination: Secondary | ICD-10-CM | POA: Diagnosis not present

## 2021-07-16 NOTE — Progress Notes (Signed)
Virtual Visit via Telephone Note   This visit type was conducted due to national recommendations for restrictions regarding the COVID-19 Pandemic (e.g. social distancing) in an effort to limit this patient's exposure and mitigate transmission in our community.  Due to his co-morbid illnesses, this patient is at least at moderate risk for complications without adequate follow up.  This format is felt to be most appropriate for this patient at this time.  The patient did not have access to video technology/had technical difficulties with video requiring transitioning to audio format only (telephone).  All issues noted in this document were discussed and addressed.  No physical exam could be performed with this format.  Please refer to the patient's chart for his  consent to telehealth for [facility]. Evaluation Performed:  Preoperative cardiovascular risk assessment  This visit type was conducted due to national recommendations for restrictions regarding the COVID-19 Pandemic (e.g. social distancing).  This format is felt to be most appropriate for this patient at this time.  All issues noted in this document were discussed and addressed.  No physical exam was performed (except for noted visual exam findings with Video Visits).  Please refer to the patient's chart (MyChart message for video visits and phone note for telephone visits) for the patient's consent to telehealth for [facility]. _____________   Date:  07/16/2021   Patient ID:  Le Faulcon, DOB 1950/08/22, MRN [id number] Patient Location:  Home Provider location:   Office  Primary Care Provider:  [name], MD Primary Cardiologist:  [name], MD  Chief Complaint    71 y.o. y/o male with a h/o coronary artery disease, hypertension, ischemic cardiomyopathy, OSA, type 2 diabetes, who is pending prostate biopsy, and presents today for telephonic preoperative cardiovascular risk assessment.  Past Medical  History    Past Medical History:  Diagnosis Date   Actinic keratoses    Angina    Asthma    BCC (basal cell carcinoma of skin) 09/06/2020   left upper back lateral (EDC) ,  left upper arm anterior (EDC), left upper back medial (EDC)    Bursitis of left elbow 2018   Diabetes mellitus without complication ([place])    ED (erectile dysfunction)    HTN (hypertension) 04/12/2011   Hyperlipemia 04/12/2011   Ischemic cardiomyopathy    a. echo 2010: EF 45-50%, mild to mod ant and apical wall HK, trace MR; b. cardiac cath 06/2012: EF 40%, mild MR    Multiple vessel coronary artery disease 04/12/2011   a. remote MI 1996; b. MI 1997 s/p PCI - LAD & diag; c. MI 2009: long stenting P-MLAD & Diag & POBA of jailed diag branch; d. cath 2010: 80% ISR of LAD w/ L-R collats, med Rx; e. cath 2012: 50-60% tub mLAD, 60-70% dLAD, FFR 0.75 -->s/p PCI dissection => w/ 2 stents (3 total); f. cath 06/2012: no changes from 2012 cath, med Rx, patent stents -> MV CAD 11/20/20 --> CABG X 5   Myocardial infarction (HCC)    x 5   Obesity    OSA (obstructive sleep apnea)    on CPAP   Osteoarthritis    Renal disorder    kidney stone   Past Surgical History:  Procedure Laterality Date   CORONARY ANGIOPLASTY     x 5   CORONARY ARTERY BYPASS GRAFT N/A 11/20/2020   Procedure: CORONARY ARTERY BYPASS GRAFTING (CABG) x  FIVE (LIMA-dLAD, SVG-PDA, SVG-D1, SeqSVG-OM1-2) ON PUMP  USING LEFT INTERNAL MAMMARY ARTERTY AND  LEFT ENDOSCOPIC GREATER SAPHENOUS VEIN CONDUITS, RIGHT LEG OPENED NOT HARVESTED;  Surgeon: Wonda Olds, MD;  Location: [facility];  Service: Open Heart Surgery;  Laterality: N/A;   CORONARY STENT PLACEMENT     x 7   INTRAVASCULAR PRESSURE WIRE/FFR STUDY N/A 11/15/2020   Procedure: INTRAVASCULAR PRESSURE WIRE/FFR STUDY;  Surgeon: Nelva Bush, MD;  Location: [place] [facility];  Service: Cardiovascular;  Laterality: N/A;   LEFT HEART CATH AND CORONARY ANGIOGRAPHY N/A 11/15/2020   Procedure: LEFT HEART CATH AND  CORONARY ANGIOGRAPHY;  Surgeon: Nelva Bush, MD;  Location: [place] [facility];  Service: Cardiovascular;  Laterality: N/A;   LEFT HEART CATHETERIZATION WITH CORONARY ANGIOGRAM N/A 04/14/2011   Procedure: LEFT HEART CATHETERIZATION WITH CORONARY ANGIOGRAM;  Surgeon: Pixie Casino, MD;  Location: [facility] CATH LAB;  Service: Cardiovascular;  Laterality: N/A;   LEFT HEART CATHETERIZATION WITH CORONARY ANGIOGRAM N/A 06/25/2012   Procedure: LEFT HEART CATHETERIZATION WITH CORONARY ANGIOGRAM;  Surgeon: Peter M Martinique, MD;  Location[facility];  Service: Cardiovascular;  Laterality: N/A;   TEE WITHOUT CARDIOVERSION N/A 11/20/2020   Procedure: TRANSESOPHAGEAL ECHOCARDIOGRAM (TEE);  Surgeon: Wonda Olds, MD;  Location: [facility];  Service: Open Heart Surgery;  Laterality: N/A;   TONSILLECTOMY AND ADENOIDECTOMY  1955    Allergies  Allergies  Allergen Reactions   Hydroxyzine Hcl Other (See Comments)    Weakness, out of this world feeling. sluggishness   Latex Rash    I.e. Elastic= rash to blisters if worn for an extended time Patient show effects after long exposure.    History of Present Illness    [name] is a 71 y.o. male who presents via audio/video conferencing for a telehealth visit today.  Pt was last seen in cardiology clinic on 03/11/21 , by Dr. Rockey Situ.  At that time Arlando Leisinger was doing well .  he is now pending prostate biopsy.  Since his last visit, he continues to be stable from a cardiac standpoint.  Today he denies chest pain, shortness of breath, lower extremity edema, fatigue, palpitations, melena, hematuria, hemoptysis, presyncope, syncope, orthopnea, and PND.    Home Medications    Prior to Admission medications   Medication Sig Start Date End Date Taking? Authorizing Provider  aspirin EC 325 MG EC tablet Take 1 tablet (325 mg total) by mouth daily. Patient not taking: Reported on 07/16/2021 11/23/20   Nani Skillern, PA-C  aspirin EC 81  MG tablet Take 81 mg by mouth daily. Swallow whole.    [provider]  atorvastatin (LIPITOR) 80 MG tablet Take 1 tablet (80 mg total) by mouth daily. 03/11/21   Minna Merritts, MD  carvedilol (COREG) 6.25 MG tablet Take 1 tablet (6.25 mg total) by mouth 2 (two) times daily with a meal. 03/11/21   Gollan, Kathlene November, MD  diphenhydramine-acetaminophen (TYLENOL PM) 25-500 MG TABS tablet Take 2 tablets by mouth at bedtime.    [provider]  Dulaglutide (TRULICITY) 2.75 TZ/0.0FV SOPN Inject 0.75 mg into the skin once a week. 12/11/20   [name], MD  ezetimibe (ZETIA) 10 MG tablet Take 1 tablet (10 mg total) by mouth daily. 12/07/20   Minna Merritts, MD  furosemide (LASIX) 40 MG tablet Take 1 tablet (40 mg total) by mouth daily. 03/11/21   Minna Merritts, MD  isosorbide mononitrate (IMDUR) 30 MG 24 hr tablet Take 1 tablet (30 mg total) by mouth daily. 03/11/21   Minna Merritts, MD  JARDIANCE 25 MG TABS tablet TAKE 1 TABLET DAILY 08/30/20   [name], MD  losartan (COZAAR) 25 MG tablet Take 1 tablet (25 mg total) by mouth daily. 03/11/21 07/16/21  Minna Merritts, MD  metFORMIN (GLUCOPHAGE) 1000 MG tablet TAKE 1 TABLET TWICE A DAY WITH A MEAL 08/30/20   [name], MD  Multiple Vitamins-Minerals (MULTIVITAMINS THER. W/MINERALS) TABS Take 1 tablet by mouth every morning. Patient not taking: Reported on 07/16/2021    [provider]  niacin (NIASPAN) 1000 MG CR tablet TAKE 2 TABLETS DAILY AT BEDTIME 10/23/20   Minna Merritts, MD  potassium chloride SA (KLOR-CON) 20 MEQ tablet Take 1 tablet (20 mEq total) by mouth daily. 03/11/21   Minna Merritts, MD    Physical Exam    Vital Signs:  Earnie Bechard does not have vital signs available for review today.  Given telephonic nature of communication, physical exam is limited. AAOx3. NAD. Normal affect.  Speech and respirations are unlabored.  Accessory Clinical Findings     None  Assessment & Plan    1.  Preoperative Cardiovascular Risk Assessment:   Chart reviewed as part of pre-operative protocol coverage. Given past medical history and time since last visit, based on ACC/AHA guidelines, Caven Perine would be at acceptable risk for the planned procedure without further cardiovascular testing.   Patient was advised that if he develops new symptoms prior to surgery to contact our office to arrange a follow-up appointment.  He verbalized understanding.  Dr. Rockey Situ has also reviewed preoperative request and recommends his aspirin be held the day of the procedure.    COVID-19 Education: The signs and symptoms of COVID-19 were discussed with the patient and how to seek care for testing (follow up with PCP or arrange E-visit).  The importance of social distancing was discussed today.  Patient Risk:   After full review of this patient's history and clinical status, I feel that he is at least moderate risk for cardiac complications at this time, thus necessitating a telehealth visit sooner than our first available in office visit.  Time:   Today, I have spent 6 minutes with the patient with telehealth technology discussing medical history, symptoms, and management plan.  I spent 10 minutes prior to contacting the patient reviewing his chart and past medical history.   Deberah Pelton, NP  07/16/2021, 1:36 PM

## 2021-07-16 NOTE — Telephone Encounter (Signed)
[name] pt set up with you today at 3 pm ?

## 2021-07-16 NOTE — Telephone Encounter (Signed)
?  Patient Consent for Virtual Visit  ? ? ?   ? ?[name] has provided verbal consent on 07/16/2021 for a virtual visit (video or telephone). ? ? ?CONSENT FOR VIRTUAL VISIT FOR:  [name]  ?By participating in this virtual visit I agree to the following: ? ?I hereby voluntarily request, consent and authorize [facility] and its employed or contracted physicians, physician assistants, nurse practitioners or other licensed health care professionals (the Practitioner), to provide me with telemedicine health care services (the ?Services") as deemed necessary by the treating Practitioner. I acknowledge and consent to receive the Services by the Practitioner via telemedicine. I understand that the telemedicine visit will involve communicating with the Practitioner through live audiovisual communication technology and the disclosure of certain medical information by electronic transmission. I acknowledge that I have been given the opportunity to request an in-person assessment or other available alternative prior to the telemedicine visit and am voluntarily participating in the telemedicine visit. ? ?I understand that I have the right to withhold or withdraw my consent to the use of telemedicine in the course of my care at any time, without affecting my right to future care or treatment, and that the Practitioner or I may terminate the telemedicine visit at any time. I understand that I have the right to inspect all information obtained and/or recorded in the course of the telemedicine visit and may receive copies of available information for a reasonable fee.  I understand that some of the potential risks of receiving the Services via telemedicine include:  ?Delay or interruption in medical evaluation due to technological equipment failure or disruption; ?Information transmitted may not be sufficient (e.g. poor resolution of images) to allow for appropriate medical decision making by the  Practitioner; and/or  ?In rare instances, security protocols could fail, causing a breach of personal health information. ? ?Furthermore, I acknowledge that it is my responsibility to provide information about my medical history, conditions and care that is complete and accurate to the best of my ability. I acknowledge that Practitioner's advice, recommendations, and/or decision may be based on factors not within their control, such as incomplete or inaccurate data provided by me or distortions of diagnostic images or specimens that may result from electronic transmissions. I understand that the practice of medicine is not an exact science and that Practitioner makes no warranties or guarantees regarding treatment outcomes. I acknowledge that a copy of this consent can be made available to me via my patient portal ([facility]), or I can request a printed copy by calling the office of [facility].   ? ?I understand that my insurance will be billed for this visit.  ? ?I have read or had this consent read to me. ?I understand the contents of this consent, which adequately explains the benefits and risks of the Services being provided via telemedicine.  ?I have been provided ample opportunity to ask questions regarding this consent and the Services and have had my questions answered to my satisfaction. ?I give my informed consent for the services to be provided through the use of telemedicine in my medical care ? ?  ?

## 2021-07-17 NOTE — Progress Notes (Signed)
? ?  07/19/21 ? ?CC:  ?Chief Complaint  ?Patient presents with  ? Prostate Biopsy  ? ? ? ? ?HPI: ? ?[name] is a 71 y.o. male with a personal history of elevated PSA, grossly abnormal rectal exam, and hematospermia, who presents today for a prostate biopsy. ? ?His PCP checked PSA for prostate screening which was 7.37 he had a previous PSA 7 years ago of 1.24. He was referred to Urology.  ?  ?He has a significant cardiac history and is managed on Effient and aspirin.  He states today that he is no longer on Effient.  He is followed by Dr. Rockey Situ. ? ?He has a family history of prostate cancer his father was diagnosed in his early 29s he had surgery for this.  He does mention that his father underwent orchiectomy.  He is unsure if it is metastatic.  ? ?He is on baby aspirin but reports he did not take it this morning.  ? ?Vitals:  ? 07/18/21 0927  ?BP: (!) 149/73  ?Pulse: 78  ? ?NED. A&Ox3.   ?No respiratory distress   ?Abd soft, NT, ND ?Normal external genitalia with patent urethral meatus ? ?Prostate Biopsy Procedure  ? ?Informed consent was obtained after discussing risks/benefits of the procedure.  A time out was performed to ensure correct patient identity. ? ?Pre-Procedure: ?- Last PSA Level:  ?Lab Results  ?Component Value Date  ? PSA 7.37 (H) 07/02/2021  ? PSA 1.24 09/01/2013  ? ?- Gentamicin given prophylactically ?- Levaquin 500 mg administered PO ?-Transrectal Ultrasound performed revealing a 53.6 gm prostate ?-Significant hypoechoic involving most of left prostate notably the tissue was more denser and resistant to the needle  ?Procedure: ?- Prostate block performed using 10 cc 1% lidocaine and biopsies taken from sextant areas, a total of 12 under ultrasound guidance. ? ?Post-Procedure: ?- Patient tolerated the procedure well ?- He was counseled to seek immediate medical attention if experiences any severe pain, significant bleeding, or fevers ?- Return in one week to discuss biopsy  results ? ? ?Conley Rolls as a scribe for Hollice Espy, MD.,have documented all relevant documentation on the behalf of Hollice Espy, MD,as directed by  Hollice Espy, MD while in the presence of Hollice Espy, MD. ? ?I have reviewed the above documentation for accuracy and completeness, and I agree with the above.  ? ?Hollice Espy, MD ? ? ?

## 2021-07-18 ENCOUNTER — Ambulatory Visit (INDEPENDENT_AMBULATORY_CARE_PROVIDER_SITE_OTHER): Payer: Medicare Other | Admitting: Urology

## 2021-07-18 ENCOUNTER — Encounter: Payer: Self-pay | Admitting: Urology

## 2021-07-18 ENCOUNTER — Other Ambulatory Visit: Payer: Self-pay

## 2021-07-18 VITALS — BP 149/73 | HR 78 | Ht 68.0 in | Wt 185.0 lb

## 2021-07-18 DIAGNOSIS — C61 Malignant neoplasm of prostate: Secondary | ICD-10-CM

## 2021-07-18 DIAGNOSIS — R972 Elevated prostate specific antigen [PSA]: Secondary | ICD-10-CM | POA: Diagnosis not present

## 2021-07-18 MED ORDER — GENTAMICIN SULFATE 40 MG/ML IJ SOLN
80.0000 mg | Freq: Once | INTRAMUSCULAR | Status: AC
  Administered 2021-07-18: 80 mg via INTRAMUSCULAR

## 2021-07-18 MED ORDER — LEVOFLOXACIN 500 MG PO TABS
500.0000 mg | ORAL_TABLET | Freq: Once | ORAL | Status: AC
  Administered 2021-07-18: 500 mg via ORAL

## 2021-07-19 ENCOUNTER — Telehealth: Payer: Self-pay | Admitting: Urology

## 2021-07-19 DIAGNOSIS — C61 Malignant neoplasm of prostate: Secondary | ICD-10-CM

## 2021-07-19 LAB — SURGICAL PATHOLOGY

## 2021-07-19 NOTE — Telephone Encounter (Signed)
Contact by pathology, Gleason 4+3 on majority of left side of prostate.   ? ?Order placed for PSMA pet scan, please let patient know and push out f/u to once scan is complete.  ? ?He was aware that this was likely by the plan. ? ?Hollice Espy, MD ? ? ? ?

## 2021-07-24 ENCOUNTER — Ambulatory Visit: Payer: Medicare Other | Admitting: Urology

## 2021-08-01 ENCOUNTER — Other Ambulatory Visit: Payer: Self-pay

## 2021-08-01 ENCOUNTER — Ambulatory Visit (HOSPITAL_COMMUNITY)
Admission: RE | Admit: 2021-08-01 | Discharge: 2021-08-01 | Disposition: A | Discharge status: Home / Self Care | Payer: Medicare Other | Source: Ambulatory Visit | Attending: Urology | Admitting: Urology

## 2021-08-01 DIAGNOSIS — C61 Malignant neoplasm of prostate: Secondary | ICD-10-CM | POA: Diagnosis not present

## 2021-08-01 DIAGNOSIS — I2510 Atherosclerotic heart disease of native coronary artery without angina pectoris: Secondary | ICD-10-CM | POA: Diagnosis not present

## 2021-08-01 DIAGNOSIS — D3500 Benign neoplasm of unspecified adrenal gland: Secondary | ICD-10-CM | POA: Diagnosis not present

## 2021-08-01 DIAGNOSIS — N200 Calculus of kidney: Secondary | ICD-10-CM | POA: Diagnosis not present

## 2021-08-01 MED ORDER — PIFLIFOLASTAT F 18 (PYLARIFY) INJECTION
9.0000 | Freq: Once | INTRAVENOUS | Status: AC
Start: 2021-08-01 — End: 2021-08-01
  Administered 2021-08-01: 9.8 via INTRAVENOUS

## 2021-08-12 NOTE — Progress Notes (Signed)
? ?08/13/21 ?1:29 PM  ? ?Marquinn Meschke Knoth ?12/09/1950 ?[id number] ? ?Referring provider:  ?[name], MD ?[address] ?[id number] ?[place],  [place] [address] ?Chief Complaint  ?Patient presents with  ? Results  ? ? ? ?HPI: ?[name] is a 71 y.o.male with a personal history of elevated PSA, grossly abnormal rectal exam, and hematospermia, who presents today for PET scan results.  ? ?PSA on 07/02/2021 was 7.37.  ? ?He underwent a prostate biopsy on 07/18/2021. TRUS 53.6 gm. Surgical pathology was consistent with Gleason 4+3 involving 5 cores affecting up to 100%.  ? ?PET scan on 08/01/2021 visualized mildly heterogeneous uptake in the prostate. Nonspecific in this patient with reported history of prostate cancer. No signs of distant disease or nodal enlargement. RIGHT UPJ obstruction appears worse than in July 29, 2017 but not as pronounced as in Jul 29, 2016. Correlate with any RIGHT-sided symptoms. LEFT nephrolithiasis. LEFT adrenal adenoma. ? 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 CRIBRIFORM GLANDS  ?PRESENT.  ? 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 CRIBRIFORM GLANDS  ?PRESENT.  ? [C] PROSTATE, LEFT APEX:   ACINAR ADENOCARCINOMA, GLEASON 4+3=7  (GG  ?3), INVOLVING 1/1 CORES, MEASURING 13  MM ( 93%). CRIBRIFORM GLANDS  ?PRESENT.  ? [D] PROSTATE, RIGHT BASE:   NEGATIVE FOR MALIGNANCY.  ? [E] PROSTATE, RIGHT MID:   NEGATIVE FOR MALIGNANCY.  ? [F] PROSTATE, RIGHT APEX:   NEGATIVE FOR MALIGNANCY.  ? 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 CRIBRIFORM GLANDS  ?PRESENT.  ? [H] PROSTATE, LEFT LATERAL MID:   NEGATIVE FOR MALIGNANCY.  ?FIBROMUSCULAR TISSUE ONLY.  ? 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 CRIBRIFORM GLANDS  ?PRESENT.  ? [J] PROSTATE, RIGHT LATERAL BASE:   NEGATIVE FOR MALIGNANCY.  ? [K] PROSTATE, RIGHT LATERAL MID:   NEGATIVE FOR MALIGNANCY.  ? [L] PROSTATE, RIGHT LATERAL APEX:   NEGATIVE FOR MALIGNANCY.  ? ? ? ?PMH: ?Past Medical History:  ?Diagnosis Date  ? Actinic keratoses   ? Angina   ? Asthma   ? BCC (basal cell carcinoma of skin) 09/06/2020  ? left upper back lateral (EDC) ,  left upper arm anterior (EDC), left upper back medial (EDC)   ? Bursitis of left elbow 07/29/16  ? Diabetes mellitus without complication ([place])   ? ED (erectile dysfunction)   ? HTN (hypertension) 04/12/2011  ? Hyperlipemia 04/12/2011  ? Ischemic cardiomyopathy   ? a. echo 07-29-08: EF 45-50%, mild to mod ant and apical wall HK, trace MR; b. cardiac cath 06/2012: EF 40%, mild MR   ? 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 a. remote MI 07-30-1994; b. MI 07-30-95 s/p PCI - LAD & diag; c. MI 30-Jul-2007: long stenting P-MLAD & Diag & POBA of jailed diag branch; d. cath 07/29/2008: 80% ISR of LAD w/ L-R collats, med Rx; e. cath 07/30/2010: 50-60% tub mLAD, 60-70% dLAD, FFR 0.75 -->s/p PCI dissection => w/ 2 stents (3 total); f. cath 06/2012: no changes from Jul 30, 2010 cath, med Rx, patent stents -> MV CAD 11/20/20 --> CABG X 5  ? Myocardial infarction [facility])   ?  x 5  ? Obesity   ? OSA (obstructive sleep apnea)   ? on CPAP  ? Osteoarthritis   ? Renal disorder   ? kidney stone  ? ? ?Surgical History: ?Past Surgical History:  ?Procedure Laterality Date  ? CORONARY ANGIOPLASTY    ? x 5  ? CORONARY ARTERY BYPASS GRAFT N/A 11/20/2020  ? Procedure: CORONARY ARTERY BYPASS GRAFTING (CABG) x  FIVE (LIMA-dLAD, SVG-PDA, SVG-D1, SeqSVG-OM1-2) ON PUMP  USING LEFT INTERNAL MAMMARY ARTERTY AND LEFT ENDOSCOPIC GREATER SAPHENOUS VEIN CONDUITS, RIGHT LEG OPENED NOT HARVESTED;  Surgeon: Wonda Olds, MD;  Location: [facility];  Service: Open Heart Surgery;  Laterality: N/A;  ? CORONARY STENT PLACEMENT    ? x 7  ? INTRAVASCULAR  PRESSURE WIRE/FFR STUDY N/A 11/15/2020  ? Procedure: INTRAVASCULAR PRESSURE WIRE/FFR STUDY;  Surgeon: Nelva Bush, MD;  Location: [facility];  Service: Cardiovascular;  Laterality: N/A;  ? LEFT HEART CATH AND CORONARY ANGIOGRAPHY N/A 11/15/2020  ? Procedure: LEFT HEART CATH AND CORONARY ANGIOGRAPHY;  Surgeon: Nelva Bush, MD;  Location: [facility];  Service: Cardiovascular;  Laterality: N/A;  ? LEFT HEART CATHETERIZATION WITH CORONARY ANGIOGRAM N/A 04/14/2011  ? Procedure: LEFT HEART CATHETERIZATION WITH CORONARY ANGIOGRAM;  Surgeon: Pixie Casino, MD;  Location: [facility] CATH LAB;  Service: Cardiovascular;  Laterality: N/A;  ? LEFT HEART CATHETERIZATION WITH CORONARY ANGIOGRAM N/A 06/25/2012  ? Procedure: LEFT HEART CATHETERIZATION WITH CORONARY ANGIOGRAM;  Surgeon: Peter M Martinique, MD;  Location: [facility] CATH LAB;  Service: Cardiovascular;  Laterality: N/A;  ? TEE WITHOUT CARDIOVERSION N/A 11/20/2020  ? Procedure: TRANSESOPHAGEAL ECHOCARDIOGRAM (TEE);  Surgeon: Wonda Olds, MD;  Location: [facility];  Service: Open Heart Surgery;  Laterality: N/A;  ? [place]  ? ? ?Home Medications:  ?Allergies as of 08/13/2021   ? ?   Reactions  ? Hydroxyzine Hcl Other (See Comments)  ? Weakness, out of this world feeling. ?sluggishness  ? Latex Rash  ? I.e. Elastic= rash to blisters if worn for an extended time ?Patient show effects after long exposure.  ? ?  ? ?  ?Medication List  ?  ? ?  ? Accurate as of August 13, 2021 11:59 PM. If you have any questions, ask your nurse or doctor.  ?  ?  ? ?  ? ?aspirin EC 81 MG tablet ?Take 81 mg by mouth daily. Swallow whole. ?  ?aspirin 325 MG EC tablet ?Take 1 tablet (325 mg total) by mouth daily. ?  ?atorvastatin 80 MG tablet ?Commonly known as: LIPITOR ?Take 1 tablet (80 mg total) by mouth daily. ?  ?carvedilol 6.25 MG tablet ?Commonly known as: COREG ?Take 1 tablet (6.25 mg total) by mouth 2 (two) times daily with a meal. ?   ?diphenhydramine-acetaminophen 25-500 MG Tabs tablet ?Commonly known as: TYLENOL PM ?Take 2 tablets by mouth at bedtime. ?  ?ezetimibe 10 MG tablet ?Commonly known as: ZETIA ?Take 1 tablet (10 mg total) by mouth daily. ?  ?furosemide 40 MG tablet ?Commonly known as: LASIX ?Take 1 tablet (40 mg total) by mouth daily. ?  ?isosorbide mononitrate 30 MG 24 hr tablet ?Commonly known as: IMDUR ?Take 1 tablet (30 mg total) by mouth daily. ?  ?Jardiance 25 MG Tabs tablet ?Generic drug: empagliflozin ?TAKE 1 TABLET DAILY ?  ?losartan 25 MG tablet ?Commonly known as: COZAAR ?Take 1 tablet (25 mg total) by mouth daily. ?  ?metFORMIN 1000 MG tablet ?Commonly known as: GLUCOPHAGE ?TAKE 1  TABLET TWICE A DAY WITH A MEAL ?  ?multivitamins ther. w/minerals Tabs tablet ?Take 1 tablet by mouth every morning. ?  ?niacin 1000 MG CR tablet ?Commonly known as: NIASPAN ?TAKE 2 TABLETS DAILY AT BEDTIME ?  ?potassium chloride SA 20 MEQ tablet ?Commonly known as: KLOR-CON M ?Take 1 tablet (20 mEq total) by mouth daily. ?  ?Trulicity 7.78 EU/2.3NT Sopn ?Generic drug: Dulaglutide ?Inject 0.75 mg into the skin once a week. ?  ? ?  ? ? ?Allergies:  ?Allergies  ?Allergen Reactions  ? Hydroxyzine Hcl Other (See Comments)  ?  Weakness, out of this world feeling. ?sluggishness  ? Latex Rash  ?  I.e. Elastic= rash to blisters if worn for an extended time ?Patient show effects after long exposure.  ? ? ?Family History: ?Family History  ?Problem Relation Age of Onset  ? COPD Mother   ? Other Father   ?     muscle tumor  ? Stroke Father   ? Prostate cancer Father   ?     mets  ? Heart disease Father   ? Alcoholism Father   ? Stroke Sister   ? Heart block Sister   ?     Heart bypass  ? Bladder Cancer Neg Hx   ? Kidney cancer Neg Hx   ? ? ?Social History:  reports that he quit smoking about 13 years ago. His smoking use included cigarettes. He has a 60.00 pack-year smoking history. He has never used smokeless tobacco. He reports that he does not drink  alcohol and does not use drugs. ? ? ?Physical Exam: ?BP (!) 146/84   Pulse 96   Ht '5\' 8"'$  (1.727 m)   Wt 193 lb (87.5 kg)   BMI 29.35 kg/m?   ?Constitutional:  Alert and oriented, No acute distress. ?HEENT: [place] AT, moist mucus membrane

## 2021-08-13 ENCOUNTER — Ambulatory Visit (INDEPENDENT_AMBULATORY_CARE_PROVIDER_SITE_OTHER): Payer: Medicare Other | Admitting: Urology

## 2021-08-13 VITALS — BP 146/84 | HR 96 | Ht 68.0 in | Wt 193.0 lb

## 2021-08-13 DIAGNOSIS — C61 Malignant neoplasm of prostate: Secondary | ICD-10-CM

## 2021-08-13 NOTE — Patient Instructions (Signed)
Leuprolide depot injection ?What is this medication? ?LEUPROLIDE (loo PROE lide) is a man-made protein that acts like a natural hormone in the body. It decreases testosterone in men and decreases estrogen in women. In men, this medicine is used to treat advanced prostate cancer. In women, some forms of this medicine may be used to treat endometriosis, uterine fibroids, or other male hormone-related problems. ?This medicine may be used for other purposes; ask your health care provider or pharmacist if you have questions. ?COMMON BRAND NAME(S): Eligard, Fensolv, Lupron Depot, Lupron Depot-Ped, Viadur ?What should I tell my care team before I take this medication? ?They need to know if you have any of these conditions: ?diabetes ?heart disease or previous heart attack ?high blood pressure ?high cholesterol ?mental illness ?osteoporosis ?pain or difficulty passing urine ?seizures ?spinal cord metastasis ?stroke ?suicidal thoughts, plans, or attempt; a previous suicide attempt by you or a family member ?tobacco smoker ?unusual vaginal bleeding (women) ?an unusual or allergic reaction to leuprolide, benzyl alcohol, other medicines, foods, dyes, or preservatives ?pregnant or trying to get pregnant ?breast-feeding ?How should I use this medication? ?This medicine is for injection into a muscle or for injection under the skin. It is given by a health care professional in a hospital or clinic setting. The specific product will determine how it will be given to you. Make sure you understand which product you receive and how often you will receive it. ?Talk to your pediatrician regarding the use of this medicine in children. Special care may be needed. ?Overdosage: If you think you have taken too much of this medicine contact a poison control center or emergency room at once. ?NOTE: This medicine is only for you. Do not share this medicine with others. ?What if I miss a dose? ?It is important not to miss a dose. Call your  doctor or health care professional if you are unable to keep an appointment. ?Depot injections: Depot injections are given either once-monthly, every 12 weeks, every 16 weeks, or every 24 weeks depending on the product you are prescribed. The product you are prescribed will be based on if you are male or male, and your condition. Make sure you understand your product and dosing. ?What may interact with this medication? ?Do not take this medicine with any of the following medications: ?chasteberry ?cisapride ?dronedarone ?pimozide ?thioridazine ?This medicine may also interact with the following medications: ?herbal or dietary supplements, like black cohosh or DHEA ?male hormones, like estrogens or progestins and birth control pills, patches, rings, or injections ?male hormones, like testosterone ?other medicines that prolong the QT interval (abnormal heart rhythm) ?This list may not describe all possible interactions. Give your health care provider a list of all the medicines, herbs, non-prescription drugs, or dietary supplements you use. Also tell them if you smoke, drink alcohol, or use illegal drugs. Some items may interact with your medicine. ?What should I watch for while using this medication? ?Visit your doctor or health care professional for regular checks on your progress. During the first weeks of treatment, your symptoms may get worse, but then will improve as you continue your treatment. You may get hot flashes, increased bone pain, increased difficulty passing urine, or an aggravation of nerve symptoms. Discuss these effects with your doctor or health care professional, some of them may improve with continued use of this medicine. ?Male patients may experience a menstrual cycle or spotting during the first months of therapy with this medicine. If this continues, contact your doctor or   health care professional. ?This medicine may increase blood sugar. Ask your healthcare provider if changes in diet  or medicines are needed if you have diabetes. ?What side effects may I notice from receiving this medication? ?Side effects that you should report to your doctor or health care professional as soon as possible: ?allergic reactions like skin rash, itching or hives, swelling of the face, lips, or tongue ?breathing problems ?chest pain ?depression or memory disorders ?pain in your legs or groin ?pain at site where injected or implanted ?seizures ?severe headache ?signs and symptoms of high blood sugar such as being more thirsty or hungry or having to urinate more than normal. You may also feel very tired or have blurry vision ?swelling of the feet and legs ?suicidal thoughts or other mood changes ?visual changes ?vomiting ?Side effects that usually do not require medical attention (report to your doctor or health care professional if they continue or are bothersome): ?breast swelling or tenderness ?decrease in sex drive or performance ?diarrhea ?hot flashes ?loss of appetite ?muscle, joint, or bone pains ?nausea ?redness or irritation at site where injected or implanted ?skin problems or acne ?This list may not describe all possible side effects. Call your doctor for medical advice about side effects. You may report side effects to FDA at 1-800-FDA-1088. ?Where should I keep my medication? ?This drug is given in a hospital or clinic and will not be stored at home. ?NOTE: This sheet is a summary. It may not cover all possible information. If you have questions about this medicine, talk to your doctor, pharmacist, or health care provider. ?? 2022 Elsevier/Gold Standard (2021-01-15 00:00:00) ? ?

## 2021-08-19 ENCOUNTER — Encounter: Payer: Self-pay | Admitting: Radiation Oncology

## 2021-08-19 ENCOUNTER — Telehealth: Payer: Self-pay

## 2021-08-19 ENCOUNTER — Ambulatory Visit
Admission: RE | Admit: 2021-08-19 | Discharge: 2021-08-19 | Disposition: A | Discharge status: Home / Self Care | Payer: Medicare Other | Source: Ambulatory Visit | Attending: Radiation Oncology | Admitting: Radiation Oncology

## 2021-08-19 VITALS — BP 142/83 | HR 89 | Temp 97.0°F | Resp 18 | Ht 68.0 in | Wt 191.6 lb

## 2021-08-19 DIAGNOSIS — L570 Actinic keratosis: Secondary | ICD-10-CM | POA: Insufficient documentation

## 2021-08-19 DIAGNOSIS — Z79899 Other long term (current) drug therapy: Secondary | ICD-10-CM | POA: Insufficient documentation

## 2021-08-19 DIAGNOSIS — Z85828 Personal history of other malignant neoplasm of skin: Secondary | ICD-10-CM | POA: Diagnosis not present

## 2021-08-19 DIAGNOSIS — E119 Type 2 diabetes mellitus without complications: Secondary | ICD-10-CM | POA: Diagnosis not present

## 2021-08-19 DIAGNOSIS — I252 Old myocardial infarction: Secondary | ICD-10-CM | POA: Diagnosis not present

## 2021-08-19 DIAGNOSIS — C61 Malignant neoplasm of prostate: Secondary | ICD-10-CM | POA: Diagnosis not present

## 2021-08-19 DIAGNOSIS — I10 Essential (primary) hypertension: Secondary | ICD-10-CM | POA: Insufficient documentation

## 2021-08-19 DIAGNOSIS — Z8042 Family history of malignant neoplasm of prostate: Secondary | ICD-10-CM | POA: Diagnosis not present

## 2021-08-19 DIAGNOSIS — E785 Hyperlipidemia, unspecified: Secondary | ICD-10-CM | POA: Insufficient documentation

## 2021-08-19 DIAGNOSIS — Z87891 Personal history of nicotine dependence: Secondary | ICD-10-CM | POA: Insufficient documentation

## 2021-08-19 DIAGNOSIS — Z7982 Long term (current) use of aspirin: Secondary | ICD-10-CM | POA: Diagnosis not present

## 2021-08-19 DIAGNOSIS — R972 Elevated prostate specific antigen [PSA]: Secondary | ICD-10-CM | POA: Diagnosis not present

## 2021-08-19 DIAGNOSIS — Z7984 Long term (current) use of oral hypoglycemic drugs: Secondary | ICD-10-CM | POA: Insufficient documentation

## 2021-08-19 NOTE — Telephone Encounter (Signed)
No PA required. Called pt to schedule no answer. Needs appt on PA schedule for Eligard.  ?

## 2021-08-19 NOTE — Telephone Encounter (Signed)
-----   Message from Shanon Ace, [place] sent at 08/13/2021 11:45 AM EDT ----- ?Regarding: Eligard ?Hey! ? ?Dr. Erlene Quan wants this patient to get Eligard. ? ?Thank you ?[place] ? ?

## 2021-08-19 NOTE — Consult Note (Signed)
?NEW PATIENT EVALUATION ? ?Name: [name]  ?MRN: [id number]  ?Date:   08/19/2021     ?DOB: 26-Sep-1950 ? ? ?This 71 y.o. 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 ? ?REFERRING PHYSICIAN: [name], MD ? ?CHIEF COMPLAINT:  ?Chief Complaint  ?Patient presents with  ? Consult  ?  Prostate Cancer  ? ? ?DIAGNOSIS: The encounter diagnosis was Malignant neoplasm of prostate ([place]). ?  ?PREVIOUS INVESTIGATIONS:  ?MRI scan reviewed ?Pathology reports reviewed ?Clinical notes reviewed ? ?HPI: Patient is a 71 year old male who presented with an elevated PSA back in February 23 of 7.37.  He had a 53 g prostate.  Had markedly abnormal nodular prostate on examination.  PET scan PSMA PET was performed showing mild heterogeneous uptake in the prostate no signs of distant disease or nodal enlargement.  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  He underwentTransrectal ultrasound-guided biopsy positive for Gleason 7 (4+3) adenocarcinoma in 5 of 12 cores.  Patient is extensive cardiac history including MI x5 stents placed ejection fraction of approximately 45%.  Not thought to be a candidate for surgical intervention.  He is fairly asymptomatic has a poor stream although no significant nocturia bowel function is fine.  He is opted for radiation therapy is now referred for treatment. ? ?PLANNED TREATMENT REGIMEN: Image guided IMRT radiation therapy ? ?PAST MEDICAL HISTORY:  has a past medical history of Actinic keratoses, Angina, Asthma, BCC (basal cell carcinoma of skin) (09/06/2020), Bursitis of left elbow (2018), Diabetes mellitus without complication ([place]), ED (erectile dysfunction), HTN (hypertension) (04/12/2011), Hyperlipemia (04/12/2011), Ischemic cardiomyopathy, Multiple vessel coronary artery disease (04/12/2011), Myocardial infarction ([place]), Obesity, OSA (obstructive sleep apnea), Osteoarthritis,  and Renal disorder.   ? ?PAST SURGICAL HISTORY:  ?Past Surgical History:  ?Procedure Laterality Date  ? CORONARY ANGIOPLASTY    ? x 5  ? CORONARY ARTERY BYPASS GRAFT N/A 11/20/2020  ? Procedure: CORONARY ARTERY BYPASS GRAFTING (CABG) x  FIVE (LIMA-dLAD, SVG-PDA, SVG-D1, SeqSVG-OM1-2) ON PUMP  USING LEFT INTERNAL MAMMARY ARTERTY AND LEFT ENDOSCOPIC GREATER SAPHENOUS VEIN CONDUITS, RIGHT LEG OPENED NOT HARVESTED;  Surgeon: Wonda Olds, MD;  Location: [place];  Service: Open Heart Surgery;  Laterality: N/A;  ? CORONARY STENT PLACEMENT    ? x 7  ? INTRAVASCULAR PRESSURE WIRE/FFR STUDY N/A 11/15/2020  ? Procedure: INTRAVASCULAR PRESSURE WIRE/FFR STUDY;  Surgeon: Nelva Bush, MD;  Location: [facility];  Service: Cardiovascular;  Laterality: N/A;  ? LEFT HEART CATH AND CORONARY ANGIOGRAPHY N/A 11/15/2020  ? Procedure: LEFT HEART CATH AND CORONARY ANGIOGRAPHY;  Surgeon: Nelva Bush, MD;  Location: [facility];  Service: Cardiovascular;  Laterality: N/A;  ? LEFT HEART CATHETERIZATION WITH CORONARY ANGIOGRAM N/A 04/14/2011  ? Procedure: LEFT HEART CATHETERIZATION WITH CORONARY ANGIOGRAM;  Surgeon: Pixie Casino, MD;  Location: [facility] CATH LAB;  Service: Cardiovascular;  Laterality: N/A;  ? LEFT HEART CATHETERIZATION WITH CORONARY ANGIOGRAM N/A 06/25/2012  ? Procedure: LEFT HEART CATHETERIZATION WITH CORONARY ANGIOGRAM;  Surgeon: Peter M Martinique, MD;  Location: [facility] CATH LAB;  Service: Cardiovascular;  Laterality: N/A;  ? TEE WITHOUT CARDIOVERSION N/A 11/20/2020  ? Procedure: TRANSESOPHAGEAL ECHOCARDIOGRAM (TEE);  Surgeon: Wonda Olds, MD;  Location: [place];  Service: Open Heart Surgery;  Laterality: N/A;  ? [place]  ? ? ?FAMILY HISTORY: family history includes Alcoholism in his father; COPD in his  mother; Heart block in his sister; Heart disease in his father; Other in his father; Prostate cancer in his father; Stroke in his father and sister. ? ?SOCIAL HISTORY:   reports that he quit smoking about 13 years ago. His smoking use included cigarettes. He has a 60.00 pack-year smoking history. He has never used smokeless tobacco. He reports that he does not drink alcohol and does not use drugs. ? ?ALLERGIES: Hydroxyzine hcl and Latex ? ?MEDICATIONS:  ?Current Outpatient Medications  ?Medication Sig Dispense Refill  ? aspirin EC 325 MG EC tablet Take 1 tablet (325 mg total) by mouth daily. 30 tablet 0  ? aspirin EC 81 MG tablet Take 81 mg by mouth daily. Swallow whole.    ? atorvastatin (LIPITOR) 80 MG tablet Take 1 tablet (80 mg total) by mouth daily. 90 tablet 3  ? carvedilol (COREG) 6.25 MG tablet Take 1 tablet (6.25 mg total) by mouth 2 (two) times daily with a meal. 180 tablet 3  ? diphenhydramine-acetaminophen (TYLENOL PM) 25-500 MG TABS tablet Take 2 tablets by mouth at bedtime.    ? Dulaglutide (TRULICITY) 2.94 TM/5.4YT SOPN Inject 0.75 mg into the skin once a week. 6 mL 1  ? ezetimibe (ZETIA) 10 MG tablet Take 1 tablet (10 mg total) by mouth daily. 90 tablet 3  ? furosemide (LASIX) 40 MG tablet Take 1 tablet (40 mg total) by mouth daily. 90 tablet 3  ? isosorbide mononitrate (IMDUR) 30 MG 24 hr tablet Take 1 tablet (30 mg total) by mouth daily. 90 tablet 3  ? JARDIANCE 25 MG TABS tablet TAKE 1 TABLET DAILY 90 tablet 3  ? losartan (COZAAR) 25 MG tablet Take 1 tablet (25 mg total) by mouth daily. 90 tablet 3  ? metFORMIN (GLUCOPHAGE) 1000 MG tablet TAKE 1 TABLET TWICE A DAY WITH A MEAL 180 tablet 3  ? Multiple Vitamins-Minerals (MULTIVITAMINS THER. W/MINERALS) TABS Take 1 tablet by mouth every morning.    ? niacin (NIASPAN) 1000 MG CR tablet TAKE 2 TABLETS DAILY AT BEDTIME 180 tablet 2  ? potassium chloride SA (KLOR-CON) 20 MEQ tablet Take 1 tablet (20 mEq total) by mouth daily. 90 tablet 3  ? ?No current facility-administered medications for this encounter.  ? ? ?ECOG PERFORMANCE STATUS:  0 - Asymptomatic ? ?REVIEW OF SYSTEMS: Patient has extensive cardiac history as  stated above. ?Patient denies any weight loss, fatigue, weakness, fever, chills or night sweats. Patient denies any loss of vision, blurred vision. Patient denies any ringing  of the ears or hearing loss. No irregular heartbeat. Patient denies heart murmur or history of fainting. Patient denies any chest pain or pain radiating to her upper extremities. Patient denies any shortness of breath, difficulty breathing at night, cough or hemoptysis. Patient denies any swelling in the lower legs. Patient denies any nausea vomiting, vomiting of blood, or coffee ground material in the vomitus. Patient denies any stomach pain. Patient states has had normal bowel movements no significant constipation or diarrhea. Patient denies any dysuria, hematuria or significant nocturia. Patient denies any problems walking, swelling in the joints or loss of balance. Patient denies any skin changes, loss of hair or loss of weight. Patient denies any excessive worrying or anxiety or significant depression. Patient denies any problems with insomnia. Patient denies excessive thirst, polyuria, polydipsia. Patient denies any swollen glands, patient denies easy bruising or easy bleeding. Patient denies any recent infections, allergies or URI. Patient "s visual fields have not changed significantly in recent time. ?PHYSICAL EXAM: ?  BP (!) 142/83   Pulse 89   Temp (!) 97 ?F (36.1 ?C)   Resp 18   Ht '5\' 8"'$  (1.727 m)   Wt 191 lb 9.6 oz (86.9 kg)   BMI 29.13 kg/m?  ?Well-developed well-nourished patient in NAD. HEENT reveals PERLA, EOMI, discs not visualized.  Oral cavity is clear. No oral mucosal lesions are identified. Neck is clear without evidence of cervical or supraclavicular adenopathy. Lungs are clear to A&P. Cardiac examination is essentially unremarkable with regular rate and rhythm without murmur rub or thrill. Abdomen is benign with no organomegaly or masses noted. Motor sensory and DTR levels are equal and symmetric in the upper and  lower extremities. Cranial nerves II through XII are grossly intact. Proprioception is intact. No peripheral adenopathy or edema is identified. No motor or sensory levels are noted. Crude visual fields are within norm

## 2021-08-24 ENCOUNTER — Encounter: Payer: Self-pay | Admitting: Dermatology

## 2021-08-28 ENCOUNTER — Encounter: Payer: Self-pay | Admitting: Urology

## 2021-08-28 ENCOUNTER — Ambulatory Visit (INDEPENDENT_AMBULATORY_CARE_PROVIDER_SITE_OTHER): Payer: Medicare Other | Admitting: Urology

## 2021-08-28 VITALS — BP 123/74 | HR 98 | Ht 68.0 in | Wt 189.0 lb

## 2021-08-28 DIAGNOSIS — C61 Malignant neoplasm of prostate: Secondary | ICD-10-CM | POA: Diagnosis not present

## 2021-08-28 MED ORDER — LEUPROLIDE ACETATE (6 MONTH) 45 MG ~~LOC~~ KIT
45.0000 mg | PACK | Freq: Once | SUBCUTANEOUS | Status: AC
  Administered 2021-08-28: 45 mg via SUBCUTANEOUS

## 2021-08-28 MED ORDER — LEVOFLOXACIN 500 MG PO TABS
500.0000 mg | ORAL_TABLET | Freq: Once | ORAL | Status: AC
  Administered 2021-08-28: 500 mg via ORAL

## 2021-08-28 MED ORDER — GENTAMICIN SULFATE 40 MG/ML IJ SOLN
80.0000 mg | Freq: Once | INTRAMUSCULAR | Status: AC
  Administered 2021-08-28: 80 mg via INTRAMUSCULAR

## 2021-08-28 NOTE — Patient Instructions (Signed)
Start Calcium and Vitamin D daily ?Leuprolide depot injection ?What is this medication? ?LEUPROLIDE (loo PROE lide) is a man-made protein that acts like a natural hormone in the body. It decreases testosterone in men and decreases estrogen in women. In men, this medicine is used to treat advanced prostate cancer. In women, some forms of this medicine may be used to treat endometriosis, uterine fibroids, or other male hormone-related problems. ?This medicine may be used for other purposes; ask your health care provider or pharmacist if you have questions. ?COMMON BRAND NAME(S): Eligard, Fensolv, Lupron Depot, Lupron Depot-Ped, Viadur ?What should I tell my care team before I take this medication? ?They need to know if you have any of these conditions: ?diabetes ?heart disease or previous heart attack ?high blood pressure ?high cholesterol ?mental illness ?osteoporosis ?pain or difficulty passing urine ?seizures ?spinal cord metastasis ?stroke ?suicidal thoughts, plans, or attempt; a previous suicide attempt by you or a family member ?tobacco smoker ?unusual vaginal bleeding (women) ?an unusual or allergic reaction to leuprolide, benzyl alcohol, other medicines, foods, dyes, or preservatives ?pregnant or trying to get pregnant ?breast-feeding ?How should I use this medication? ?This medicine is for injection into a muscle or for injection under the skin. It is given by a health care professional in a hospital or clinic setting. The specific product will determine how it will be given to you. Make sure you understand which product you receive and how often you will receive it. ?Talk to your pediatrician regarding the use of this medicine in children. Special care may be needed. ?Overdosage: If you think you have taken too much of this medicine contact a poison control center or emergency room at once. ?NOTE: This medicine is only for you. Do not share this medicine with others. ?What if I miss a dose? ?It is  important not to miss a dose. Call your doctor or health care professional if you are unable to keep an appointment. ?Depot injections: Depot injections are given either once-monthly, every 12 weeks, every 16 weeks, or every 24 weeks depending on the product you are prescribed. The product you are prescribed will be based on if you are male or male, and your condition. Make sure you understand your product and dosing. ?What may interact with this medication? ?Do not take this medicine with any of the following medications: ?chasteberry ?cisapride ?dronedarone ?pimozide ?thioridazine ?This medicine may also interact with the following medications: ?herbal or dietary supplements, like black cohosh or DHEA ?male hormones, like estrogens or progestins and birth control pills, patches, rings, or injections ?male hormones, like testosterone ?other medicines that prolong the QT interval (abnormal heart rhythm) ?This list may not describe all possible interactions. Give your health care provider a list of all the medicines, herbs, non-prescription drugs, or dietary supplements you use. Also tell them if you smoke, drink alcohol, or use illegal drugs. Some items may interact with your medicine. ?What should I watch for while using this medication? ?Visit your doctor or health care professional for regular checks on your progress. During the first weeks of treatment, your symptoms may get worse, but then will improve as you continue your treatment. You may get hot flashes, increased bone pain, increased difficulty passing urine, or an aggravation of nerve symptoms. Discuss these effects with your doctor or health care professional, some of them may improve with continued use of this medicine. ?Male patients may experience a menstrual cycle or spotting during the first months of therapy with this medicine. If  this continues, contact your doctor or health care professional. ?This medicine may increase blood sugar. Ask  your healthcare provider if changes in diet or medicines are needed if you have diabetes. ?What side effects may I notice from receiving this medication? ?Side effects that you should report to your doctor or health care professional as soon as possible: ?allergic reactions like skin rash, itching or hives, swelling of the face, lips, or tongue ?breathing problems ?chest pain ?depression or memory disorders ?pain in your legs or groin ?pain at site where injected or implanted ?seizures ?severe headache ?signs and symptoms of high blood sugar such as being more thirsty or hungry or having to urinate more than normal. You may also feel very tired or have blurry vision ?swelling of the feet and legs ?suicidal thoughts or other mood changes ?visual changes ?vomiting ?Side effects that usually do not require medical attention (report to your doctor or health care professional if they continue or are bothersome): ?breast swelling or tenderness ?decrease in sex drive or performance ?diarrhea ?hot flashes ?loss of appetite ?muscle, joint, or bone pains ?nausea ?redness or irritation at site where injected or implanted ?skin problems or acne ?This list may not describe all possible side effects. Call your doctor for medical advice about side effects. You may report side effects to FDA at 1-800-FDA-1088. ?Where should I keep my medication? ?This drug is given in a hospital or clinic and will not be stored at home. ?NOTE: This sheet is a summary. It may not cover all possible information. If you have questions about this medicine, talk to your doctor, pharmacist, or health care provider. ?? 2023 Elsevier/Gold Standard (2021-03-29 00:00:00) ? ?

## 2021-08-28 NOTE — Progress Notes (Signed)
Eligard SubQ Injection  ? ?Due to Prostate Cancer patient is present today for a Eligard Injection. ? ?Medication: Eligard 6 month ?Dose: 45 mg  ?Location: left upper abd ?Lot: [id number] ?Exp: 03/2023 ? ?Patient tolerated well, no complications were noted ? ?Performed by: Fonnie Jarvis, CMA ? ?Per Dr. Erlene Quan patient is a one time dose . Patient given reminder continue on Vitamin D 800-1000iu and Calcium 1000-'1200mg'$  daily while on Androgen Deprivation Therapy.  ?PA approval dates:  ?No PA required ?

## 2021-08-28 NOTE — Progress Notes (Signed)
? ?  09/03/21 ? ?CC:  ?Chief Complaint  ?Patient presents with  ? Gold Seed Placement  ? ? ?HPI: ?[name] is a 71 y.o.male with a personal history of prostate cancer and hematospermia, who presents today for eligard injection and gold seed markers.  ? ?He underwent a prostate biopsy on 07/18/2021. TRUS 53.6 gm. Surgical pathology was consistent with Gleason 4+3 involving 5 cores affecting up to 100%.  ?  ?PET scan on 08/01/2021 visualized mildly heterogeneous uptake in the prostate. Nonspecific in this patient with reported history of prostate cancer. No signs of distant disease or nodal enlargement. RIGHT UPJ obstruction appears worse than in August 04, 2017 but not as pronounced as in 08/04/16. Correlate with any RIGHT-sided symptoms. LEFT nephrolithiasis. LEFT adrenal adenoma. ? ? ?Vitals:  ? 08/28/21 1146  ?BP: 123/74  ?Pulse: 98  ? ?NED. A&Ox3.   ?No respiratory distress   ?Abd soft, NT, ND ?Normal external genitalia with patent urethral meatus ? ? ?Prostate Gold Seed Marker Placement Procedure  ? ?Informed consent was obtained after discussing risks/benefits of the procedure.  A time out was performed to ensure correct patient identity. ? ?Pre-Procedure: ?- Gentamicin given prophylactically ?- PO Levaquin 500 mg also given today ? ?Procedure: ?-Lidocaine jelly was administered per rectum ?-Rectal ultrasound probe was placed without difficulty and the prostate visualized ?- 3 fiducial gold seed markers placed, one at right base, one at left base, one at apex of prostate gland under transrectal ultrasound guidance ? ?Post-Procedure: ?- Patient tolerated the procedure well ?- He was counseled to seek immediate medical attention if experiences any severe pain, significant bleeding, or fevers ? ?Assessment and plan ? ?Prostate cancer  ?- Eligard injection given today x 4-monthDepo ?- Successful gold sleep marker placement  ? ?IConley Rollsas a scribe for AHollice Espy MD.,have documented all relevant  documentation on the behalf of AHollice Espy MD,as directed by  AHollice Espy MD while in the presence of AHollice Espy MD. ? ?I have reviewed the above documentation for accuracy and completeness, and I agree with the above.  ? ?AHollice Espy MD ? ?

## 2021-09-02 ENCOUNTER — Encounter: Payer: Self-pay | Admitting: Family Medicine

## 2021-09-03 ENCOUNTER — Ambulatory Visit
Admission: RE | Admit: 2021-09-03 | Discharge: 2021-09-03 | Disposition: A | Discharge status: Home / Self Care | Payer: Medicare Other | Source: Ambulatory Visit | Attending: Radiation Oncology | Admitting: Radiation Oncology

## 2021-09-03 DIAGNOSIS — C61 Malignant neoplasm of prostate: Secondary | ICD-10-CM | POA: Insufficient documentation

## 2021-09-03 DIAGNOSIS — R972 Elevated prostate specific antigen [PSA]: Secondary | ICD-10-CM | POA: Diagnosis not present

## 2021-09-05 ENCOUNTER — Other Ambulatory Visit: Payer: Self-pay | Admitting: *Deleted

## 2021-09-05 DIAGNOSIS — C61 Malignant neoplasm of prostate: Secondary | ICD-10-CM

## 2021-09-05 LAB — HM DIABETES EYE EXAM

## 2021-09-06 DIAGNOSIS — R972 Elevated prostate specific antigen [PSA]: Secondary | ICD-10-CM | POA: Diagnosis not present

## 2021-09-06 DIAGNOSIS — C61 Malignant neoplasm of prostate: Secondary | ICD-10-CM | POA: Diagnosis not present

## 2021-09-10 ENCOUNTER — Ambulatory Visit: Admission: RE | Admit: 2021-09-10 | Payer: Medicare Other | Source: Ambulatory Visit

## 2021-09-11 ENCOUNTER — Ambulatory Visit
Admission: RE | Admit: 2021-09-11 | Discharge: 2021-09-11 | Disposition: A | Discharge status: Home / Self Care | Payer: Medicare Other | Source: Ambulatory Visit | Attending: Radiation Oncology | Admitting: Radiation Oncology

## 2021-09-11 ENCOUNTER — Other Ambulatory Visit: Payer: Self-pay

## 2021-09-11 DIAGNOSIS — C61 Malignant neoplasm of prostate: Secondary | ICD-10-CM | POA: Diagnosis not present

## 2021-09-11 DIAGNOSIS — R972 Elevated prostate specific antigen [PSA]: Secondary | ICD-10-CM | POA: Diagnosis not present

## 2021-09-11 LAB — RAD ONC ARIA SESSION SUMMARY
Course Elapsed Days: 0
Plan Fractions Treated to Date: 1
Plan Prescribed Dose Per Fraction: 2 Gy
Plan Total Fractions Prescribed: 40
Plan Total Prescribed Dose: 80 Gy
Reference Point Dosage Given to Date: 2 Gy
Reference Point Session Dosage Given: 2 Gy
Session Number: 1

## 2021-09-12 ENCOUNTER — Ambulatory Visit
Admission: RE | Admit: 2021-09-12 | Discharge: 2021-09-12 | Disposition: A | Discharge status: Home / Self Care | Payer: Medicare Other | Source: Ambulatory Visit | Attending: Radiation Oncology | Admitting: Radiation Oncology

## 2021-09-12 ENCOUNTER — Other Ambulatory Visit: Payer: Self-pay

## 2021-09-12 DIAGNOSIS — C61 Malignant neoplasm of prostate: Secondary | ICD-10-CM | POA: Diagnosis not present

## 2021-09-12 DIAGNOSIS — R972 Elevated prostate specific antigen [PSA]: Secondary | ICD-10-CM | POA: Diagnosis not present

## 2021-09-12 LAB — RAD ONC ARIA SESSION SUMMARY
Course Elapsed Days: 1
Plan Fractions Treated to Date: 2
Plan Prescribed Dose Per Fraction: 2 Gy
Plan Total Fractions Prescribed: 40
Plan Total Prescribed Dose: 80 Gy
Reference Point Dosage Given to Date: 4 Gy
Reference Point Session Dosage Given: 2 Gy
Session Number: 2

## 2021-09-13 ENCOUNTER — Other Ambulatory Visit: Payer: Self-pay

## 2021-09-13 ENCOUNTER — Ambulatory Visit
Admission: RE | Admit: 2021-09-13 | Discharge: 2021-09-13 | Disposition: A | Discharge status: Home / Self Care | Payer: Medicare Other | Source: Ambulatory Visit | Attending: Radiation Oncology | Admitting: Radiation Oncology

## 2021-09-13 DIAGNOSIS — C61 Malignant neoplasm of prostate: Secondary | ICD-10-CM | POA: Diagnosis not present

## 2021-09-13 DIAGNOSIS — R972 Elevated prostate specific antigen [PSA]: Secondary | ICD-10-CM | POA: Diagnosis not present

## 2021-09-13 LAB — RAD ONC ARIA SESSION SUMMARY
Course Elapsed Days: 2
Plan Fractions Treated to Date: 3
Plan Prescribed Dose Per Fraction: 2 Gy
Plan Total Fractions Prescribed: 40
Plan Total Prescribed Dose: 80 Gy
Reference Point Dosage Given to Date: 6 Gy
Reference Point Session Dosage Given: 2 Gy
Session Number: 3

## 2021-09-16 ENCOUNTER — Ambulatory Visit
Admission: RE | Admit: 2021-09-16 | Discharge: 2021-09-16 | Disposition: A | Discharge status: Home / Self Care | Payer: Medicare Other | Source: Ambulatory Visit | Attending: Radiation Oncology | Admitting: Radiation Oncology

## 2021-09-16 ENCOUNTER — Other Ambulatory Visit: Payer: Self-pay

## 2021-09-16 DIAGNOSIS — C61 Malignant neoplasm of prostate: Secondary | ICD-10-CM | POA: Diagnosis not present

## 2021-09-16 DIAGNOSIS — R972 Elevated prostate specific antigen [PSA]: Secondary | ICD-10-CM | POA: Diagnosis not present

## 2021-09-16 LAB — RAD ONC ARIA SESSION SUMMARY
Course Elapsed Days: 5
Plan Fractions Treated to Date: 4
Plan Prescribed Dose Per Fraction: 2 Gy
Plan Total Fractions Prescribed: 40
Plan Total Prescribed Dose: 80 Gy
Reference Point Dosage Given to Date: 8 Gy
Reference Point Session Dosage Given: 2 Gy
Session Number: 4

## 2021-09-17 ENCOUNTER — Ambulatory Visit
Admission: RE | Admit: 2021-09-17 | Discharge: 2021-09-17 | Disposition: A | Discharge status: Home / Self Care | Payer: Medicare Other | Source: Ambulatory Visit | Attending: Radiation Oncology | Admitting: Radiation Oncology

## 2021-09-17 ENCOUNTER — Other Ambulatory Visit: Payer: Self-pay

## 2021-09-17 DIAGNOSIS — C61 Malignant neoplasm of prostate: Secondary | ICD-10-CM | POA: Diagnosis not present

## 2021-09-17 DIAGNOSIS — R972 Elevated prostate specific antigen [PSA]: Secondary | ICD-10-CM | POA: Diagnosis not present

## 2021-09-17 LAB — RAD ONC ARIA SESSION SUMMARY
Course Elapsed Days: 6
Plan Fractions Treated to Date: 5
Plan Prescribed Dose Per Fraction: 2 Gy
Plan Total Fractions Prescribed: 40
Plan Total Prescribed Dose: 80 Gy
Reference Point Dosage Given to Date: 10 Gy
Reference Point Session Dosage Given: 2 Gy
Session Number: 5

## 2021-09-18 ENCOUNTER — Other Ambulatory Visit: Payer: Self-pay

## 2021-09-18 ENCOUNTER — Ambulatory Visit (INDEPENDENT_AMBULATORY_CARE_PROVIDER_SITE_OTHER): Payer: Medicare Other | Admitting: Dermatology

## 2021-09-18 ENCOUNTER — Ambulatory Visit
Admission: RE | Admit: 2021-09-18 | Discharge: 2021-09-18 | Disposition: A | Discharge status: Home / Self Care | Payer: Medicare Other | Source: Ambulatory Visit | Attending: Radiation Oncology | Admitting: Radiation Oncology

## 2021-09-18 DIAGNOSIS — C61 Malignant neoplasm of prostate: Secondary | ICD-10-CM | POA: Diagnosis not present

## 2021-09-18 DIAGNOSIS — D485 Neoplasm of uncertain behavior of skin: Secondary | ICD-10-CM | POA: Diagnosis not present

## 2021-09-18 DIAGNOSIS — B078 Other viral warts: Secondary | ICD-10-CM | POA: Diagnosis not present

## 2021-09-18 LAB — RAD ONC ARIA SESSION SUMMARY
Course Elapsed Days: 7
Plan Fractions Treated to Date: 6
Plan Prescribed Dose Per Fraction: 2 Gy
Plan Total Fractions Prescribed: 40
Plan Total Prescribed Dose: 80 Gy
Reference Point Dosage Given to Date: 12 Gy
Reference Point Session Dosage Given: 2 Gy
Session Number: 6

## 2021-09-18 NOTE — Progress Notes (Signed)
   Follow-Up Visit   Subjective  [name] is a 71 y.o. male who presents for the following: Irregular skin lesion (R thumb nail - irregular nail changes for a couple of years. Patient is here today for a nail matrix biopsy.).  The following portions of the chart were reviewed this encounter and updated as appropriate:   Tobacco  Allergies  Meds  Problems  Med Hx  Surg Hx  Fam Hx     Review of Systems:  No other skin or systemic complaints except as noted in HPI or Assessment and Plan.  Objective  Well appearing patient in no apparent distress; mood and affect are within normal limits.  A focused examination was performed including the R hand. Relevant physical exam findings are noted in the Assessment and Plan.  R thumb Lateral nail dystrophy, keratotic at proximal nail fold       Assessment & Plan  Neoplasm of uncertain behavior of skin R thumb  Skin / nail biopsy Type of biopsy: punch   Informed consent: discussed and consent obtained   Timeout: patient name, date of birth, surgical site, and procedure verified   Procedure prep:  Patient was prepped and draped in usual sterile fashion (the patient was cleaned and prepped) Prep type:  Isopropyl alcohol Anesthesia: the lesion was anesthetized in a standard fashion   Anesthetic:  1% lidocaine w/ epinephrine 1-100,000 buffered w/ 8.4% NaHCO3 Punch size:  3.5 mm Hemostasis achieved with: pressure and Gelfoam   Outcome: patient tolerated procedure well   Post-procedure details: sterile dressing applied and wound care instructions given   Dressing type: bandage and pressure dressing    Specimen 1 - Surgical pathology Differential Diagnosis: D48.5 r/o CA  Check Margins: No   Return for follow up pending biopsy results.  Luther Redo, CMA, am acting as scribe for Sarina Ser, MD . Documentation: I have reviewed the above documentation for accuracy and completeness, and I agree with the  above.  Sarina Ser, MD

## 2021-09-18 NOTE — Patient Instructions (Addendum)

## 2021-09-19 ENCOUNTER — Other Ambulatory Visit: Payer: Self-pay

## 2021-09-19 ENCOUNTER — Ambulatory Visit
Admission: RE | Admit: 2021-09-19 | Discharge: 2021-09-19 | Disposition: A | Discharge status: Home / Self Care | Payer: Medicare Other | Source: Ambulatory Visit | Attending: Radiation Oncology | Admitting: Radiation Oncology

## 2021-09-19 DIAGNOSIS — C61 Malignant neoplasm of prostate: Secondary | ICD-10-CM | POA: Diagnosis not present

## 2021-09-19 LAB — RAD ONC ARIA SESSION SUMMARY
Course Elapsed Days: 8
Plan Fractions Treated to Date: 7
Plan Prescribed Dose Per Fraction: 2 Gy
Plan Total Fractions Prescribed: 40
Plan Total Prescribed Dose: 80 Gy
Reference Point Dosage Given to Date: 14 Gy
Reference Point Session Dosage Given: 2 Gy
Session Number: 7

## 2021-09-20 ENCOUNTER — Other Ambulatory Visit: Payer: Self-pay

## 2021-09-20 ENCOUNTER — Ambulatory Visit
Admission: RE | Admit: 2021-09-20 | Discharge: 2021-09-20 | Disposition: A | Discharge status: Home / Self Care | Payer: Medicare Other | Source: Ambulatory Visit | Attending: Radiation Oncology | Admitting: Radiation Oncology

## 2021-09-20 DIAGNOSIS — C61 Malignant neoplasm of prostate: Secondary | ICD-10-CM | POA: Diagnosis not present

## 2021-09-20 LAB — RAD ONC ARIA SESSION SUMMARY
Course Elapsed Days: 9
Plan Fractions Treated to Date: 8
Plan Prescribed Dose Per Fraction: 2 Gy
Plan Total Fractions Prescribed: 40
Plan Total Prescribed Dose: 80 Gy
Reference Point Dosage Given to Date: 16 Gy
Reference Point Session Dosage Given: 2 Gy
Session Number: 8

## 2021-09-23 ENCOUNTER — Ambulatory Visit
Admission: RE | Admit: 2021-09-23 | Discharge: 2021-09-23 | Disposition: A | Discharge status: Home / Self Care | Payer: Medicare Other | Source: Ambulatory Visit | Attending: Radiation Oncology | Admitting: Radiation Oncology

## 2021-09-23 ENCOUNTER — Other Ambulatory Visit: Payer: Self-pay

## 2021-09-23 DIAGNOSIS — C61 Malignant neoplasm of prostate: Secondary | ICD-10-CM | POA: Diagnosis not present

## 2021-09-23 LAB — RAD ONC ARIA SESSION SUMMARY
Course Elapsed Days: 12
Plan Fractions Treated to Date: 9
Plan Prescribed Dose Per Fraction: 2 Gy
Plan Total Fractions Prescribed: 40
Plan Total Prescribed Dose: 80 Gy
Reference Point Dosage Given to Date: 18 Gy
Reference Point Session Dosage Given: 2 Gy
Session Number: 9

## 2021-09-24 ENCOUNTER — Other Ambulatory Visit: Payer: Self-pay

## 2021-09-24 ENCOUNTER — Telehealth: Payer: Self-pay

## 2021-09-24 ENCOUNTER — Ambulatory Visit
Admission: RE | Admit: 2021-09-24 | Discharge: 2021-09-24 | Disposition: A | Discharge status: Home / Self Care | Payer: Medicare Other | Source: Ambulatory Visit | Attending: Radiation Oncology | Admitting: Radiation Oncology

## 2021-09-24 DIAGNOSIS — C61 Malignant neoplasm of prostate: Secondary | ICD-10-CM | POA: Diagnosis not present

## 2021-09-24 LAB — RAD ONC ARIA SESSION SUMMARY
Course Elapsed Days: 13
Plan Fractions Treated to Date: 10
Plan Prescribed Dose Per Fraction: 2 Gy
Plan Total Fractions Prescribed: 40
Plan Total Prescribed Dose: 80 Gy
Reference Point Dosage Given to Date: 20 Gy
Reference Point Session Dosage Given: 2 Gy
Session Number: 10

## 2021-09-24 NOTE — Telephone Encounter (Signed)
-----   Message from Ralene Bathe, MD sent at 09/23/2021  6:11 PM EDT ----- ?Diagnosis ?Skin , right thumb ?VERRUCA VULGARIS, ENDOPHYTIC TYPE, BASE INVOLVED ? ?Benign viral wart ?No evidence of cancer ?Warts may persist or recur ?Will discuss treatment options at follow up appt -- if pt has no fu appt, may make follow up in 6 weeks. ?

## 2021-09-24 NOTE — Telephone Encounter (Signed)
Left message on voicemail to return my call.  

## 2021-09-25 ENCOUNTER — Other Ambulatory Visit: Payer: Self-pay

## 2021-09-25 ENCOUNTER — Ambulatory Visit
Admission: RE | Admit: 2021-09-25 | Discharge: 2021-09-25 | Disposition: A | Discharge status: Home / Self Care | Payer: Medicare Other | Source: Ambulatory Visit | Attending: Radiation Oncology | Admitting: Radiation Oncology

## 2021-09-25 ENCOUNTER — Inpatient Hospital Stay: Payer: Medicare Other

## 2021-09-25 DIAGNOSIS — C61 Malignant neoplasm of prostate: Secondary | ICD-10-CM | POA: Insufficient documentation

## 2021-09-25 LAB — CBC
HCT: 42.4 % (ref 39.0–52.0)
Hemoglobin: 14.7 g/dL (ref 13.0–17.0)
MCH: 31.7 pg (ref 26.0–34.0)
MCHC: 34.7 g/dL (ref 30.0–36.0)
MCV: 91.6 fL (ref 80.0–100.0)
Platelets: 174 10*3/uL (ref 150–400)
RBC: 4.63 MIL/uL (ref 4.22–5.81)
RDW: 12.7 % (ref 11.5–15.5)
WBC: 7.8 10*3/uL (ref 4.0–10.5)
nRBC: 0 % (ref 0.0–0.2)

## 2021-09-25 LAB — RAD ONC ARIA SESSION SUMMARY
Course Elapsed Days: 14
Plan Fractions Treated to Date: 11
Plan Prescribed Dose Per Fraction: 2 Gy
Plan Total Fractions Prescribed: 40
Plan Total Prescribed Dose: 80 Gy
Reference Point Dosage Given to Date: 22 Gy
Reference Point Session Dosage Given: 2 Gy
Session Number: 11

## 2021-09-26 ENCOUNTER — Ambulatory Visit
Admission: RE | Admit: 2021-09-26 | Discharge: 2021-09-26 | Disposition: A | Discharge status: Home / Self Care | Payer: Medicare Other | Source: Ambulatory Visit | Attending: Radiation Oncology | Admitting: Radiation Oncology

## 2021-09-26 ENCOUNTER — Other Ambulatory Visit: Payer: Self-pay

## 2021-09-26 DIAGNOSIS — C61 Malignant neoplasm of prostate: Secondary | ICD-10-CM | POA: Diagnosis not present

## 2021-09-26 LAB — RAD ONC ARIA SESSION SUMMARY
Course Elapsed Days: 15
Plan Fractions Treated to Date: 12
Plan Prescribed Dose Per Fraction: 2 Gy
Plan Total Fractions Prescribed: 40
Plan Total Prescribed Dose: 80 Gy
Reference Point Dosage Given to Date: 24 Gy
Reference Point Session Dosage Given: 2 Gy
Session Number: 12

## 2021-09-27 ENCOUNTER — Other Ambulatory Visit: Payer: Self-pay

## 2021-09-27 ENCOUNTER — Ambulatory Visit
Admission: RE | Admit: 2021-09-27 | Discharge: 2021-09-27 | Disposition: A | Discharge status: Home / Self Care | Payer: Medicare Other | Source: Ambulatory Visit | Attending: Radiation Oncology | Admitting: Radiation Oncology

## 2021-09-27 DIAGNOSIS — C61 Malignant neoplasm of prostate: Secondary | ICD-10-CM | POA: Diagnosis not present

## 2021-09-27 LAB — RAD ONC ARIA SESSION SUMMARY
Course Elapsed Days: 16
Plan Fractions Treated to Date: 13
Plan Prescribed Dose Per Fraction: 2 Gy
Plan Total Fractions Prescribed: 40
Plan Total Prescribed Dose: 80 Gy
Reference Point Dosage Given to Date: 26 Gy
Reference Point Session Dosage Given: 2 Gy
Session Number: 13

## 2021-09-30 ENCOUNTER — Ambulatory Visit
Admission: RE | Admit: 2021-09-30 | Discharge: 2021-09-30 | Disposition: A | Discharge status: Home / Self Care | Payer: Medicare Other | Source: Ambulatory Visit | Attending: Radiation Oncology | Admitting: Radiation Oncology

## 2021-09-30 ENCOUNTER — Other Ambulatory Visit: Payer: Self-pay

## 2021-09-30 DIAGNOSIS — C61 Malignant neoplasm of prostate: Secondary | ICD-10-CM | POA: Diagnosis not present

## 2021-09-30 LAB — RAD ONC ARIA SESSION SUMMARY
Course Elapsed Days: 19
Plan Fractions Treated to Date: 14
Plan Prescribed Dose Per Fraction: 2 Gy
Plan Total Fractions Prescribed: 40
Plan Total Prescribed Dose: 80 Gy
Reference Point Dosage Given to Date: 28 Gy
Reference Point Session Dosage Given: 2 Gy
Session Number: 14

## 2021-10-01 ENCOUNTER — Ambulatory Visit
Admission: RE | Admit: 2021-10-01 | Discharge: 2021-10-01 | Disposition: A | Discharge status: Home / Self Care | Payer: Medicare Other | Source: Ambulatory Visit | Attending: Radiation Oncology | Admitting: Radiation Oncology

## 2021-10-01 ENCOUNTER — Telehealth: Payer: Self-pay

## 2021-10-01 ENCOUNTER — Other Ambulatory Visit: Payer: Self-pay | Admitting: *Deleted

## 2021-10-01 ENCOUNTER — Other Ambulatory Visit: Payer: Self-pay

## 2021-10-01 DIAGNOSIS — C61 Malignant neoplasm of prostate: Secondary | ICD-10-CM | POA: Diagnosis not present

## 2021-10-01 LAB — RAD ONC ARIA SESSION SUMMARY
Course Elapsed Days: 20
Plan Fractions Treated to Date: 15
Plan Prescribed Dose Per Fraction: 2 Gy
Plan Total Fractions Prescribed: 40
Plan Total Prescribed Dose: 80 Gy
Reference Point Dosage Given to Date: 30 Gy
Reference Point Session Dosage Given: 2 Gy
Session Number: 15

## 2021-10-01 MED ORDER — TAMSULOSIN HCL 0.4 MG PO CAPS: 0.4000 mg | ORAL_CAPSULE | Freq: Every day | ORAL | 0 refills | Status: DC

## 2021-10-01 NOTE — Telephone Encounter (Signed)
Patient informed of pathology results and already scheduled with Dr. Laurence Ferrari in six weeks.

## 2021-10-01 NOTE — Telephone Encounter (Signed)
-----   Message from Ralene Bathe, MD sent at 09/23/2021  6:11 PM EDT ----- Diagnosis Skin , right thumb VERRUCA VULGARIS, ENDOPHYTIC TYPE, BASE INVOLVED  Benign viral wart No evidence of cancer Warts may persist or recur Will discuss treatment options at follow up appt -- if pt has no fu appt, may make follow up in 6 weeks.

## 2021-10-02 ENCOUNTER — Ambulatory Visit
Admission: RE | Admit: 2021-10-02 | Discharge: 2021-10-02 | Disposition: A | Discharge status: Home / Self Care | Payer: Medicare Other | Source: Ambulatory Visit | Attending: Radiation Oncology | Admitting: Radiation Oncology

## 2021-10-02 ENCOUNTER — Encounter: Payer: Self-pay | Admitting: Family

## 2021-10-02 ENCOUNTER — Other Ambulatory Visit: Payer: Self-pay

## 2021-10-02 ENCOUNTER — Telehealth: Payer: Self-pay | Admitting: Family Medicine

## 2021-10-02 ENCOUNTER — Ambulatory Visit (INDEPENDENT_AMBULATORY_CARE_PROVIDER_SITE_OTHER): Payer: Medicare Other | Admitting: Family

## 2021-10-02 VITALS — BP 100/60 | HR 103 | Temp 98.3°F | Ht 68.0 in | Wt 181.0 lb

## 2021-10-02 DIAGNOSIS — C61 Malignant neoplasm of prostate: Secondary | ICD-10-CM | POA: Diagnosis not present

## 2021-10-02 DIAGNOSIS — R300 Dysuria: Secondary | ICD-10-CM | POA: Insufficient documentation

## 2021-10-02 DIAGNOSIS — E1159 Type 2 diabetes mellitus with other circulatory complications: Secondary | ICD-10-CM

## 2021-10-02 DIAGNOSIS — E1165 Type 2 diabetes mellitus with hyperglycemia: Secondary | ICD-10-CM | POA: Diagnosis not present

## 2021-10-02 LAB — RAD ONC ARIA SESSION SUMMARY
Course Elapsed Days: 21
Plan Fractions Treated to Date: 16
Plan Prescribed Dose Per Fraction: 2 Gy
Plan Total Fractions Prescribed: 40
Plan Total Prescribed Dose: 80 Gy
Reference Point Dosage Given to Date: 32 Gy
Reference Point Session Dosage Given: 2 Gy
Session Number: 16

## 2021-10-02 LAB — URINALYSIS, MICROSCOPIC ONLY

## 2021-10-02 LAB — POCT GLYCOSYLATED HEMOGLOBIN (HGB A1C): Hemoglobin A1C: 8.9 % — AB (ref 4.0–5.6)

## 2021-10-02 LAB — POCT URINALYSIS DIPSTICK
Glucose, UA: POSITIVE — AB
Nitrite, UA: POSITIVE
Protein, UA: POSITIVE — AB
Spec Grav, UA: 1.01 (ref 1.010–1.025)
Urobilinogen, UA: 1 E.U./dL
pH, UA: 5.5 (ref 5.0–8.0)

## 2021-10-02 MED ORDER — NITROFURANTOIN MONOHYD MACRO 100 MG PO CAPS: 100.0000 mg | ORAL_CAPSULE | Freq: Two times a day (BID) | ORAL | 0 refills | Status: DC

## 2021-10-02 MED ORDER — TRULICITY 1.5 MG/0.5ML ~~LOC~~ SOAJ: 1.5000 mg | SUBCUTANEOUS | 0 refills | Status: DC

## 2021-10-02 NOTE — Assessment & Plan Note (Addendum)
New.  Afebrile.  Nontoxic in appearance. urinalysis positive for glucose ( expected on jardiance), nitrites , leukocytes, blood.  Concern for urinary tract infection and we have agreed to start [place].  Pending urine culture.  Patient will let me know how he is doing

## 2021-10-02 NOTE — Assessment & Plan Note (Addendum)
Lab Results  Component Value Date   HGBA1C 8.9 (A) 10/02/2021   Uncontrolled. A1c with slight improvement.  He has tolerated Trulicity.  Increase Trulicity to 1.'5mg'$ .  Continue Jardiance 25 mg, metformin 1000 mg twice daily

## 2021-10-02 NOTE — Telephone Encounter (Signed)
[name]  See below Let me know once corrected

## 2021-10-02 NOTE — Telephone Encounter (Signed)
Prescrition printed and given to patient and I called [facility] and cancelled the prescription.  Jami Bogdanski,cma

## 2021-10-02 NOTE — Progress Notes (Signed)
Subjective:    Patient ID: [name], male    DOB: 05/30/1950, 71 y.o.   MRN: [id number]  CC: Ekin Pilar is a 71 y.o. male who presents today for an acute visit.    HPI: Complains of urinary frequency, dysuria x  7 days, worsening   Endorses chills. He is nausea at times prior to urinary symptoms.  No abdominal pain, nausea.   He has bilateral low back pain which is occasional.  No recent antibiotics. He is focused on eating more.   He started azo yesterday.   He had prostate radiation yesterday   h/o renal stone. This doesn't feel like renal stone.    DM- compliant with trulicity 9.50. metformin , jardiance '25mg'$  No vision changes, increased thirst.  History of hypertension, diabetes, ischemic cardiomyopathy.  He is compliant with Lasix, Jardiance  No h/o CKD  History of prostate cancer and following with urology.  Currently undergoing radiation following with Dr. Erlene Quan, Dr Baruch Gouty  Previous history of blood noted in his semen.  Elevated PSA  Former smoker HISTORY:  Past Medical History:  Diagnosis Date   Actinic keratoses    Angina    Asthma    BCC (basal cell carcinoma of skin) 09/06/2020   left upper back lateral (EDC) ,  left upper arm anterior (EDC), left upper back medial (EDC)    Bursitis of left elbow 2018   Diabetes mellitus without complication ([place])    ED (erectile dysfunction)    HTN (hypertension) 04/12/2011   Hyperlipemia 04/12/2011   Ischemic cardiomyopathy    a. echo 2010: EF 45-50%, mild to mod ant and apical wall HK, trace MR; b. cardiac cath 06/2012: EF 40%, mild MR    Multiple vessel coronary artery disease 04/12/2011   a. remote MI 1996; b. MI 1997 s/p PCI - LAD & diag; c. MI 2009: long stenting P-MLAD & Diag & POBA of jailed diag branch; d. cath 2010: 80% ISR of LAD w/ L-R collats, med Rx; e. cath 2012: 50-60% tub mLAD, 60-70% dLAD, FFR 0.75 -->s/p PCI dissection => w/ 2 stents (3 total); f. cath 06/2012: no changes from  2012 cath, med Rx, patent stents -> MV CAD 11/20/20 --> CABG X 5   Myocardial infarction (HCC)    x 5   Obesity    OSA (obstructive sleep apnea)    on CPAP   Osteoarthritis    Renal disorder    kidney stone   Past Surgical History:  Procedure Laterality Date   CORONARY ANGIOPLASTY     x 5   CORONARY ARTERY BYPASS GRAFT N/A 11/20/2020   Procedure: CORONARY ARTERY BYPASS GRAFTING (CABG) x  FIVE (LIMA-dLAD, SVG-PDA, SVG-D1, SeqSVG-OM1-2) ON PUMP  USING LEFT INTERNAL MAMMARY ARTERTY AND LEFT ENDOSCOPIC GREATER SAPHENOUS VEIN CONDUITS, RIGHT LEG OPENED NOT HARVESTED;  Surgeon: Wonda Olds, MD;  Location: [facility];  Service: Open Heart Surgery;  Laterality: N/A;   CORONARY STENT PLACEMENT     x 7   INTRAVASCULAR PRESSURE WIRE/FFR STUDY N/A 11/15/2020   Procedure: INTRAVASCULAR PRESSURE WIRE/FFR STUDY;  Surgeon: Nelva Bush, MD;  Location: [facility];  Service: Cardiovascular;  Laterality: N/A;   LEFT HEART CATH AND CORONARY ANGIOGRAPHY N/A 11/15/2020   Procedure: LEFT HEART CATH AND CORONARY ANGIOGRAPHY;  Surgeon: Nelva Bush, MD;  Location: [facility];  Service: Cardiovascular;  Laterality: N/A;   LEFT HEART CATHETERIZATION WITH CORONARY ANGIOGRAM N/A 04/14/2011   Procedure: LEFT HEART CATHETERIZATION WITH CORONARY  ANGIOGRAM;  Surgeon: Pixie Casino, MD;  Location[facility] CATH LAB;  Service: Cardiovascular;  Laterality: N/A;   LEFT HEART CATHETERIZATION WITH CORONARY ANGIOGRAM N/A 06/25/2012   Procedure: LEFT HEART CATHETERIZATION WITH CORONARY ANGIOGRAM;  Surgeon: Peter M Martinique, MD;  Location: [facility] CATH LAB;  Service: Cardiovascular;  Laterality: N/A;   TEE WITHOUT CARDIOVERSION N/A 11/20/2020   Procedure: TRANSESOPHAGEAL ECHOCARDIOGRAM (TEE);  Surgeon: Wonda Olds, MD;  Location: [place];  Service: Open Heart Surgery;  Laterality: N/A;   TONSILLECTOMY AND ADENOIDECTOMY  1955   Family History  Problem Relation Age of Onset   COPD Mother    Other Father         muscle tumor   Stroke Father    Prostate cancer Father        mets   Heart disease Father    Alcoholism Father    Stroke Sister    Heart block Sister        Heart bypass   Bladder Cancer Neg Hx    Kidney cancer Neg Hx     Allergies: Hydroxyzine hcl and Latex Current Outpatient Medications on File Prior to Visit  Medication Sig Dispense Refill   aspirin EC 325 MG EC tablet Take 1 tablet (325 mg total) by mouth daily. 30 tablet 0   aspirin EC 81 MG tablet Take 81 mg by mouth daily. Swallow whole.     atorvastatin (LIPITOR) 80 MG tablet Take 1 tablet (80 mg total) by mouth daily. 90 tablet 3   carvedilol (COREG) 6.25 MG tablet Take 1 tablet (6.25 mg total) by mouth 2 (two) times daily with a meal. 180 tablet 3   diphenhydramine-acetaminophen (TYLENOL PM) 25-500 MG TABS tablet Take 2 tablets by mouth at bedtime.     ezetimibe (ZETIA) 10 MG tablet Take 1 tablet (10 mg total) by mouth daily. 90 tablet 3   furosemide (LASIX) 40 MG tablet Take 1 tablet (40 mg total) by mouth daily. 90 tablet 3   isosorbide mononitrate (IMDUR) 30 MG 24 hr tablet Take 1 tablet (30 mg total) by mouth daily. 90 tablet 3   JARDIANCE 25 MG TABS tablet TAKE 1 TABLET DAILY 90 tablet 3   metFORMIN (GLUCOPHAGE) 1000 MG tablet TAKE 1 TABLET TWICE A DAY WITH A MEAL 180 tablet 3   Multiple Vitamins-Minerals (MULTIVITAMINS THER. W/MINERALS) TABS Take 1 tablet by mouth every morning.     niacin (NIASPAN) 1000 MG CR tablet TAKE 2 TABLETS DAILY AT BEDTIME 180 tablet 2   potassium chloride SA (KLOR-CON) 20 MEQ tablet Take 1 tablet (20 mEq total) by mouth daily. 90 tablet 3   tamsulosin (FLOMAX) 0.4 MG CAPS capsule Take 1 capsule (0.4 mg total) by mouth daily after supper. 90 capsule 0   losartan (COZAAR) 25 MG tablet Take 1 tablet (25 mg total) by mouth daily. 90 tablet 3   No current facility-administered medications on file prior to visit.    Social History   Tobacco Use   Smoking status: Former    Packs/day: 1.50     Years: 40.00    Pack years: 60.00    Types: Cigarettes    Quit date: 09/14/2007    Years since quitting: 14.0   Smokeless tobacco: Never   Tobacco comments:    quit 2009  Vaping Use   Vaping Use: Never used  Substance Use Topics   Alcohol use: No    Alcohol/week: 0.0 standard drinks    Comment: rarely 1 beer in last  5 years   Drug use: No    Types: Marijuana    Comment: pt stopped fall 2012    Review of Systems  Constitutional:  Negative for chills and fever.  Respiratory:  Negative for cough.   Cardiovascular:  Negative for chest pain and palpitations.  Gastrointestinal:  Negative for nausea and vomiting.  Genitourinary:  Positive for dysuria and frequency.     Objective:    BP 100/60 (BP Location: Left Arm, Patient Position: Sitting, Cuff Size: Normal)   Pulse (!) 103   Temp 98.3 F (36.8 C) (Oral)   Ht '5\' 8"'$  (1.727 m)   Wt 181 lb (82.1 kg)   SpO2 95%   BMI 27.52 kg/m   Wt Readings from Last 3 Encounters:  10/02/21 181 lb (82.1 kg)  08/28/21 189 lb (85.7 kg)  08/19/21 191 lb 9.6 oz (86.9 kg)    Physical Exam Vitals reviewed.  Constitutional:      Appearance: He is well-developed.  Cardiovascular:     Rate and Rhythm: Regular rhythm.     Heart sounds: Normal heart sounds.  Pulmonary:     Effort: Pulmonary effort is normal. No respiratory distress.     Breath sounds: Normal breath sounds. No wheezing, rhonchi or rales.  Abdominal:     Tenderness: There is abdominal tenderness in the suprapubic area.     Comments: Slight suprapubic tenderness with deep palpation  Skin:    General: Skin is warm and dry.  Neurological:     Mental Status: He is alert.  Psychiatric:        Speech: Speech normal.        Behavior: Behavior normal.       Assessment & Plan:   Problem List Items Addressed This Visit       Endocrine   Uncontrolled type 2 diabetes mellitus with hyperglycemia ([place]) - Primary (Chronic)    Lab Results  Component Value Date   HGBA1C 8.9  (A) 10/02/2021  Uncontrolled. A1c with slight improvement.  He has tolerated Trulicity.  Increase Trulicity to 1.'5mg'$ .  Continue Jardiance 25 mg, metformin 1000 mg twice daily      Relevant Medications   Dulaglutide (TRULICITY) 1.5 LP/3.7TK SOPN   Other Relevant Orders   POCT HgB A1C (Completed)     Other   Dysuria    New.  Afebrile.  Nontoxic in appearance. urinalysis positive for glucose ( expected on jardiance), nitrites , leukocytes, blood.  Concern for urinary tract infection and we have agreed to start [place].  Pending urine culture.  Patient will let me know how he is doing       Relevant Medications   nitrofurantoin, macrocrystal-monohydrate, (MACROBID) 100 MG capsule   Other Relevant Orders   POCT Urinalysis Dipstick (Completed)   Urine Culture   Urine Microscopic Only   Other Visit Diagnoses     Controlled type 2 diabetes mellitus with other circulatory complication, without long-term current use of insulin (HCC)       Relevant Medications   Dulaglutide (TRULICITY) 1.5 WI/0.9BD SOPN         I have discontinued Alfonse Flavors Trulicity. I am also having him start on Trulicity. Additionally, I am having him maintain his multivitamins ther. w/minerals, Jardiance, metFORMIN, niacin, diphenhydramine-acetaminophen, aspirin EC, ezetimibe, losartan, potassium chloride SA, atorvastatin, carvedilol, furosemide, isosorbide mononitrate, aspirin EC, tamsulosin, and nitrofurantoin (macrocrystal-monohydrate).   Meds ordered this encounter  Medications   Dulaglutide (TRULICITY) 1.5 ZH/2.9JM SOPN    Sig: Inject 1.5 mg  into the skin once a week.    Dispense:  6 mL    Refill:  0    Requests 90 day supply    Order Specific Question:   Supervising Provider    Answer:   Crecencio Mc [[id number]]   DISCONTD: nitrofurantoin, macrocrystal-monohydrate, (MACROBID) 100 MG capsule    Sig: Take 1 capsule (100 mg total) by mouth 2 (two) times daily.    Dispense:  14 capsule    Refill:   0    Order Specific Question:   Supervising Provider    Answer:   Deborra Medina L [[id number]]   nitrofurantoin, macrocrystal-monohydrate, (MACROBID) 100 MG capsule    Sig: Take 1 capsule (100 mg total) by mouth 2 (two) times daily.    Dispense:  14 capsule    Refill:  0    Order Specific Question:   Supervising Provider    Answer:   Crecencio Mc [[id number]]    Return precautions given.   Risks, benefits, and alternatives of the medications and treatment plan prescribed today were discussed, and patient expressed understanding.   Education regarding symptom management and diagnosis given to patient on AVS.  Continue to follow with [name], MD for routine health maintenance.   Leone Payor and I agreed with plan.   Mable Paris, FNP

## 2021-10-02 NOTE — Patient Instructions (Signed)
As discussed, increase Trulicity to 1.5 mg  I have also sent an antibiotic, Macrobid, please either fill this with [facility] if they can mail this within 1 day or go to local pharmacy as I printed the prescription for you.  Please continue increase water intake.    Ensure to take probiotics while on antibiotics and also for 2 weeks after completion. This can either be by eating yogurt daily or taking a probiotic supplement over the counter such as Culturelle.It is important to re-colonize the gut with good bacteria and also to prevent any diarrheal infections associated with antibiotic use.    Call us and let us know of any concerns.

## 2021-10-02 NOTE — Telephone Encounter (Signed)
Patient came into office, he was seen today by Arnett. Patient was prescribed a antibiotic and was given a paper prescription to  [facility]. Provider checked to see how long it would take to get from [place], too long. [facility], he was told by them that they could not feel it because it has already been ordered through [facility]. Please cancel [facility] request for MACROBID '100Mg'$  so [facility] will fill. Thank you.

## 2021-10-03 ENCOUNTER — Other Ambulatory Visit: Payer: Self-pay | Admitting: Cardiovascular Disease

## 2021-10-03 ENCOUNTER — Other Ambulatory Visit: Payer: Self-pay | Admitting: Family Medicine

## 2021-10-03 ENCOUNTER — Ambulatory Visit
Admission: RE | Admit: 2021-10-03 | Discharge: 2021-10-03 | Disposition: A | Discharge status: Home / Self Care | Payer: Medicare Other | Source: Ambulatory Visit | Attending: Radiation Oncology | Admitting: Radiation Oncology

## 2021-10-03 ENCOUNTER — Other Ambulatory Visit: Payer: Self-pay

## 2021-10-03 ENCOUNTER — Encounter: Payer: Self-pay | Admitting: Dermatology

## 2021-10-03 DIAGNOSIS — C61 Malignant neoplasm of prostate: Secondary | ICD-10-CM | POA: Diagnosis not present

## 2021-10-03 LAB — RAD ONC ARIA SESSION SUMMARY
Course Elapsed Days: 22
Plan Fractions Treated to Date: 17
Plan Prescribed Dose Per Fraction: 2 Gy
Plan Total Fractions Prescribed: 40
Plan Total Prescribed Dose: 80 Gy
Reference Point Dosage Given to Date: 34 Gy
Reference Point Session Dosage Given: 2 Gy
Session Number: 17

## 2021-10-04 ENCOUNTER — Ambulatory Visit
Admission: RE | Admit: 2021-10-04 | Discharge: 2021-10-04 | Disposition: A | Discharge status: Home / Self Care | Payer: Medicare Other | Source: Ambulatory Visit | Attending: Radiation Oncology | Admitting: Radiation Oncology

## 2021-10-04 ENCOUNTER — Other Ambulatory Visit: Payer: Self-pay

## 2021-10-04 DIAGNOSIS — C61 Malignant neoplasm of prostate: Secondary | ICD-10-CM | POA: Diagnosis not present

## 2021-10-04 LAB — RAD ONC ARIA SESSION SUMMARY
Course Elapsed Days: 23
Plan Fractions Treated to Date: 18
Plan Prescribed Dose Per Fraction: 2 Gy
Plan Total Fractions Prescribed: 40
Plan Total Prescribed Dose: 80 Gy
Reference Point Dosage Given to Date: 36 Gy
Reference Point Session Dosage Given: 2 Gy
Session Number: 18

## 2021-10-04 LAB — URINE CULTURE
MICRO NUMBER:: 13439822
SPECIMEN QUALITY:: ADEQUATE

## 2021-10-08 ENCOUNTER — Ambulatory Visit
Admission: RE | Admit: 2021-10-08 | Discharge: 2021-10-08 | Disposition: A | Discharge status: Home / Self Care | Payer: Medicare Other | Source: Ambulatory Visit | Attending: Radiation Oncology | Admitting: Radiation Oncology

## 2021-10-08 ENCOUNTER — Other Ambulatory Visit: Payer: Self-pay

## 2021-10-08 ENCOUNTER — Telehealth: Payer: Self-pay

## 2021-10-08 DIAGNOSIS — C61 Malignant neoplasm of prostate: Secondary | ICD-10-CM | POA: Diagnosis not present

## 2021-10-08 DIAGNOSIS — R300 Dysuria: Secondary | ICD-10-CM

## 2021-10-08 DIAGNOSIS — E1165 Type 2 diabetes mellitus with hyperglycemia: Secondary | ICD-10-CM

## 2021-10-08 LAB — RAD ONC ARIA SESSION SUMMARY
Course Elapsed Days: 27
Plan Fractions Treated to Date: 19
Plan Prescribed Dose Per Fraction: 2 Gy
Plan Total Fractions Prescribed: 40
Plan Total Prescribed Dose: 80 Gy
Reference Point Dosage Given to Date: 38 Gy
Reference Point Session Dosage Given: 2 Gy
Session Number: 19

## 2021-10-08 MED ORDER — TRULICITY 1.5 MG/0.5ML ~~LOC~~ SOAJ: 1.5000 mg | SUBCUTANEOUS | 0 refills | Status: DC

## 2021-10-08 NOTE — Telephone Encounter (Signed)
LMTCB for results. Pt needs UA scheduled in 6-8 weeks & I have ordered.

## 2021-10-09 ENCOUNTER — Other Ambulatory Visit: Payer: Self-pay

## 2021-10-09 ENCOUNTER — Ambulatory Visit
Admission: RE | Admit: 2021-10-09 | Discharge: 2021-10-09 | Disposition: A | Discharge status: Home / Self Care | Payer: Medicare Other | Source: Ambulatory Visit | Attending: Radiation Oncology | Admitting: Radiation Oncology

## 2021-10-09 DIAGNOSIS — C61 Malignant neoplasm of prostate: Secondary | ICD-10-CM | POA: Diagnosis not present

## 2021-10-09 LAB — RAD ONC ARIA SESSION SUMMARY
Course Elapsed Days: 28
Plan Fractions Treated to Date: 20
Plan Prescribed Dose Per Fraction: 2 Gy
Plan Total Fractions Prescribed: 40
Plan Total Prescribed Dose: 80 Gy
Reference Point Dosage Given to Date: 40 Gy
Reference Point Session Dosage Given: 2 Gy
Session Number: 20

## 2021-10-10 ENCOUNTER — Ambulatory Visit
Admission: RE | Admit: 2021-10-10 | Discharge: 2021-10-10 | Disposition: A | Discharge status: Home / Self Care | Payer: Medicare Other | Source: Ambulatory Visit | Attending: Radiation Oncology | Admitting: Radiation Oncology

## 2021-10-10 ENCOUNTER — Other Ambulatory Visit: Payer: Self-pay

## 2021-10-10 DIAGNOSIS — C61 Malignant neoplasm of prostate: Secondary | ICD-10-CM | POA: Diagnosis not present

## 2021-10-10 LAB — RAD ONC ARIA SESSION SUMMARY
Course Elapsed Days: 29
Plan Fractions Treated to Date: 21
Plan Prescribed Dose Per Fraction: 2 Gy
Plan Total Fractions Prescribed: 40
Plan Total Prescribed Dose: 80 Gy
Reference Point Dosage Given to Date: 42 Gy
Reference Point Session Dosage Given: 2 Gy
Session Number: 21

## 2021-10-11 ENCOUNTER — Other Ambulatory Visit: Payer: Self-pay

## 2021-10-11 ENCOUNTER — Ambulatory Visit
Admission: RE | Admit: 2021-10-11 | Discharge: 2021-10-11 | Disposition: A | Discharge status: Home / Self Care | Payer: Medicare Other | Source: Ambulatory Visit | Attending: Radiation Oncology | Admitting: Radiation Oncology

## 2021-10-11 DIAGNOSIS — C61 Malignant neoplasm of prostate: Secondary | ICD-10-CM | POA: Diagnosis not present

## 2021-10-11 LAB — RAD ONC ARIA SESSION SUMMARY
Course Elapsed Days: 30
Plan Fractions Treated to Date: 22
Plan Prescribed Dose Per Fraction: 2 Gy
Plan Total Fractions Prescribed: 40
Plan Total Prescribed Dose: 80 Gy
Reference Point Dosage Given to Date: 44 Gy
Reference Point Session Dosage Given: 2 Gy
Session Number: 22

## 2021-10-14 ENCOUNTER — Other Ambulatory Visit: Payer: Self-pay

## 2021-10-14 ENCOUNTER — Ambulatory Visit
Admission: RE | Admit: 2021-10-14 | Discharge: 2021-10-14 | Disposition: A | Discharge status: Home / Self Care | Payer: Medicare Other | Source: Ambulatory Visit | Attending: Radiation Oncology | Admitting: Radiation Oncology

## 2021-10-14 DIAGNOSIS — C61 Malignant neoplasm of prostate: Secondary | ICD-10-CM | POA: Diagnosis not present

## 2021-10-14 LAB — RAD ONC ARIA SESSION SUMMARY
Course Elapsed Days: 33
Plan Fractions Treated to Date: 23
Plan Prescribed Dose Per Fraction: 2 Gy
Plan Total Fractions Prescribed: 40
Plan Total Prescribed Dose: 80 Gy
Reference Point Dosage Given to Date: 46 Gy
Reference Point Session Dosage Given: 2 Gy
Session Number: 23

## 2021-10-15 ENCOUNTER — Ambulatory Visit
Admission: RE | Admit: 2021-10-15 | Discharge: 2021-10-15 | Disposition: A | Discharge status: Home / Self Care | Payer: Medicare Other | Source: Ambulatory Visit | Attending: Radiation Oncology | Admitting: Radiation Oncology

## 2021-10-15 ENCOUNTER — Other Ambulatory Visit: Payer: Self-pay

## 2021-10-15 DIAGNOSIS — C61 Malignant neoplasm of prostate: Secondary | ICD-10-CM | POA: Diagnosis not present

## 2021-10-15 LAB — RAD ONC ARIA SESSION SUMMARY
Course Elapsed Days: 34
Plan Fractions Treated to Date: 24
Plan Prescribed Dose Per Fraction: 2 Gy
Plan Total Fractions Prescribed: 40
Plan Total Prescribed Dose: 80 Gy
Reference Point Dosage Given to Date: 48 Gy
Reference Point Session Dosage Given: 2 Gy
Session Number: 24

## 2021-10-16 ENCOUNTER — Other Ambulatory Visit: Payer: Self-pay

## 2021-10-16 ENCOUNTER — Ambulatory Visit
Admission: RE | Admit: 2021-10-16 | Discharge: 2021-10-16 | Disposition: A | Discharge status: Home / Self Care | Payer: Medicare Other | Source: Ambulatory Visit | Attending: Radiation Oncology | Admitting: Radiation Oncology

## 2021-10-16 ENCOUNTER — Inpatient Hospital Stay: Payer: Medicare Other

## 2021-10-16 DIAGNOSIS — C61 Malignant neoplasm of prostate: Secondary | ICD-10-CM | POA: Insufficient documentation

## 2021-10-16 LAB — RAD ONC ARIA SESSION SUMMARY
Course Elapsed Days: 35
Plan Fractions Treated to Date: 25
Plan Prescribed Dose Per Fraction: 2 Gy
Plan Total Fractions Prescribed: 40
Plan Total Prescribed Dose: 80 Gy
Reference Point Dosage Given to Date: 50 Gy
Reference Point Session Dosage Given: 2 Gy
Session Number: 25

## 2021-10-16 LAB — CBC
HCT: 39.6 % (ref 39.0–52.0)
Hemoglobin: 13.2 g/dL (ref 13.0–17.0)
MCH: 31.5 pg (ref 26.0–34.0)
MCHC: 33.3 g/dL (ref 30.0–36.0)
MCV: 94.5 fL (ref 80.0–100.0)
Platelets: 211 10*3/uL (ref 150–400)
RBC: 4.19 MIL/uL — ABNORMAL LOW (ref 4.22–5.81)
RDW: 13.1 % (ref 11.5–15.5)
WBC: 5.9 10*3/uL (ref 4.0–10.5)
nRBC: 0 % (ref 0.0–0.2)

## 2021-10-17 ENCOUNTER — Other Ambulatory Visit: Payer: Self-pay

## 2021-10-17 ENCOUNTER — Ambulatory Visit
Admission: RE | Admit: 2021-10-17 | Discharge: 2021-10-17 | Disposition: A | Discharge status: Home / Self Care | Payer: Medicare Other | Source: Ambulatory Visit | Attending: Radiation Oncology | Admitting: Radiation Oncology

## 2021-10-17 DIAGNOSIS — C61 Malignant neoplasm of prostate: Secondary | ICD-10-CM | POA: Diagnosis not present

## 2021-10-17 LAB — RAD ONC ARIA SESSION SUMMARY
Course Elapsed Days: 36
Plan Fractions Treated to Date: 26
Plan Prescribed Dose Per Fraction: 2 Gy
Plan Total Fractions Prescribed: 40
Plan Total Prescribed Dose: 80 Gy
Reference Point Dosage Given to Date: 52 Gy
Reference Point Session Dosage Given: 2 Gy
Session Number: 26

## 2021-10-18 ENCOUNTER — Ambulatory Visit
Admission: RE | Admit: 2021-10-18 | Discharge: 2021-10-18 | Disposition: A | Discharge status: Home / Self Care | Payer: Medicare Other | Source: Ambulatory Visit | Attending: Radiation Oncology | Admitting: Radiation Oncology

## 2021-10-18 ENCOUNTER — Other Ambulatory Visit: Payer: Self-pay

## 2021-10-18 DIAGNOSIS — C61 Malignant neoplasm of prostate: Secondary | ICD-10-CM | POA: Diagnosis not present

## 2021-10-18 LAB — RAD ONC ARIA SESSION SUMMARY
Course Elapsed Days: 37
Plan Fractions Treated to Date: 27
Plan Prescribed Dose Per Fraction: 2 Gy
Plan Total Fractions Prescribed: 40
Plan Total Prescribed Dose: 80 Gy
Reference Point Dosage Given to Date: 54 Gy
Reference Point Session Dosage Given: 2 Gy
Session Number: 27

## 2021-10-21 ENCOUNTER — Encounter: Payer: Self-pay | Admitting: Emergency Medicine

## 2021-10-21 ENCOUNTER — Other Ambulatory Visit: Payer: Self-pay

## 2021-10-21 ENCOUNTER — Inpatient Hospital Stay
Admission: EM | Admit: 2021-10-21 | Discharge: 2021-10-25 | DRG: 871 | Disposition: A | Discharge status: Home / Self Care | Payer: Medicare Other | Attending: Family Medicine | Admitting: Family Medicine

## 2021-10-21 ENCOUNTER — Ambulatory Visit
Admission: RE | Admit: 2021-10-21 | Discharge: 2021-10-21 | Disposition: A | Discharge status: Home / Self Care | Payer: Medicare Other | Source: Ambulatory Visit | Attending: Radiation Oncology | Admitting: Radiation Oncology

## 2021-10-21 ENCOUNTER — Emergency Department: Payer: Medicare Other

## 2021-10-21 DIAGNOSIS — E1165 Type 2 diabetes mellitus with hyperglycemia: Secondary | ICD-10-CM

## 2021-10-21 DIAGNOSIS — T402X5A Adverse effect of other opioids, initial encounter: Secondary | ICD-10-CM | POA: Diagnosis present

## 2021-10-21 DIAGNOSIS — R0902 Hypoxemia: Secondary | ICD-10-CM | POA: Diagnosis not present

## 2021-10-21 DIAGNOSIS — C61 Malignant neoplasm of prostate: Secondary | ICD-10-CM | POA: Diagnosis not present

## 2021-10-21 DIAGNOSIS — I5022 Chronic systolic (congestive) heart failure: Secondary | ICD-10-CM | POA: Diagnosis present

## 2021-10-21 DIAGNOSIS — I255 Ischemic cardiomyopathy: Secondary | ICD-10-CM | POA: Diagnosis not present

## 2021-10-21 DIAGNOSIS — Z79899 Other long term (current) drug therapy: Secondary | ICD-10-CM

## 2021-10-21 DIAGNOSIS — K5730 Diverticulosis of large intestine without perforation or abscess without bleeding: Secondary | ICD-10-CM | POA: Diagnosis not present

## 2021-10-21 DIAGNOSIS — N12 Tubulo-interstitial nephritis, not specified as acute or chronic: Secondary | ICD-10-CM | POA: Diagnosis not present

## 2021-10-21 DIAGNOSIS — J9601 Acute respiratory failure with hypoxia: Secondary | ICD-10-CM | POA: Diagnosis present

## 2021-10-21 DIAGNOSIS — Z7984 Long term (current) use of oral hypoglycemic drugs: Secondary | ICD-10-CM

## 2021-10-21 DIAGNOSIS — R000 Tachycardia, unspecified: Secondary | ICD-10-CM | POA: Diagnosis not present

## 2021-10-21 DIAGNOSIS — Z7982 Long term (current) use of aspirin: Secondary | ICD-10-CM

## 2021-10-21 DIAGNOSIS — Z955 Presence of coronary angioplasty implant and graft: Secondary | ICD-10-CM

## 2021-10-21 DIAGNOSIS — Z85828 Personal history of other malignant neoplasm of skin: Secondary | ICD-10-CM

## 2021-10-21 DIAGNOSIS — Z8249 Family history of ischemic heart disease and other diseases of the circulatory system: Secondary | ICD-10-CM

## 2021-10-21 DIAGNOSIS — K5900 Constipation, unspecified: Secondary | ICD-10-CM | POA: Diagnosis present

## 2021-10-21 DIAGNOSIS — Z91040 Latex allergy status: Secondary | ICD-10-CM | POA: Diagnosis not present

## 2021-10-21 DIAGNOSIS — Z888 Allergy status to other drugs, medicaments and biological substances status: Secondary | ICD-10-CM

## 2021-10-21 DIAGNOSIS — R300 Dysuria: Secondary | ICD-10-CM | POA: Diagnosis not present

## 2021-10-21 DIAGNOSIS — I252 Old myocardial infarction: Secondary | ICD-10-CM

## 2021-10-21 DIAGNOSIS — N3289 Other specified disorders of bladder: Secondary | ICD-10-CM | POA: Diagnosis not present

## 2021-10-21 DIAGNOSIS — N10 Acute tubulo-interstitial nephritis: Secondary | ICD-10-CM

## 2021-10-21 DIAGNOSIS — G4733 Obstructive sleep apnea (adult) (pediatric): Secondary | ICD-10-CM | POA: Diagnosis present

## 2021-10-21 DIAGNOSIS — N132 Hydronephrosis with renal and ureteral calculous obstruction: Secondary | ICD-10-CM | POA: Diagnosis not present

## 2021-10-21 DIAGNOSIS — Z87891 Personal history of nicotine dependence: Secondary | ICD-10-CM

## 2021-10-21 DIAGNOSIS — R109 Unspecified abdominal pain: Secondary | ICD-10-CM

## 2021-10-21 DIAGNOSIS — I110 Hypertensive heart disease with heart failure: Secondary | ICD-10-CM | POA: Diagnosis present

## 2021-10-21 DIAGNOSIS — A4151 Sepsis due to Escherichia coli [E. coli]: Secondary | ICD-10-CM | POA: Diagnosis present

## 2021-10-21 DIAGNOSIS — I2510 Atherosclerotic heart disease of native coronary artery without angina pectoris: Secondary | ICD-10-CM | POA: Diagnosis present

## 2021-10-21 DIAGNOSIS — I10 Essential (primary) hypertension: Secondary | ICD-10-CM | POA: Diagnosis not present

## 2021-10-21 DIAGNOSIS — J45909 Unspecified asthma, uncomplicated: Secondary | ICD-10-CM | POA: Diagnosis present

## 2021-10-21 DIAGNOSIS — Z825 Family history of asthma and other chronic lower respiratory diseases: Secondary | ICD-10-CM

## 2021-10-21 DIAGNOSIS — N136 Pyonephrosis: Secondary | ICD-10-CM | POA: Diagnosis present

## 2021-10-21 DIAGNOSIS — D6959 Other secondary thrombocytopenia: Secondary | ICD-10-CM | POA: Diagnosis present

## 2021-10-21 DIAGNOSIS — Z823 Family history of stroke: Secondary | ICD-10-CM

## 2021-10-21 DIAGNOSIS — Z8042 Family history of malignant neoplasm of prostate: Secondary | ICD-10-CM | POA: Diagnosis not present

## 2021-10-21 DIAGNOSIS — K4090 Unilateral inguinal hernia, without obstruction or gangrene, not specified as recurrent: Secondary | ICD-10-CM | POA: Diagnosis not present

## 2021-10-21 DIAGNOSIS — I25110 Atherosclerotic heart disease of native coronary artery with unstable angina pectoris: Secondary | ICD-10-CM | POA: Diagnosis not present

## 2021-10-21 LAB — BASIC METABOLIC PANEL
Anion gap: 10 (ref 5–15)
BUN: 15 mg/dL (ref 8–23)
CO2: 27 mmol/L (ref 22–32)
Calcium: 10.1 mg/dL (ref 8.9–10.3)
Chloride: 100 mmol/L (ref 98–111)
Creatinine, Ser: 0.94 mg/dL (ref 0.61–1.24)
GFR, Estimated: >60 mL/min (ref >60)
Glucose, Bld: 416 mg/dL — ABNORMAL HIGH (ref 70–99)
Potassium: 3.9 mmol/L (ref 3.5–5.1)
Sodium: 137 mmol/L (ref 135–145)

## 2021-10-21 LAB — RAD ONC ARIA SESSION SUMMARY
Course Elapsed Days: 40
Plan Fractions Treated to Date: 28
Plan Prescribed Dose Per Fraction: 2 Gy
Plan Total Fractions Prescribed: 40
Plan Total Prescribed Dose: 80 Gy
Reference Point Dosage Given to Date: 56 Gy
Reference Point Session Dosage Given: 2 Gy
Session Number: 28

## 2021-10-21 LAB — CBC
HCT: 44.3 % (ref 39.0–52.0)
Hemoglobin: 14.6 g/dL (ref 13.0–17.0)
MCH: 31.1 pg (ref 26.0–34.0)
MCHC: 33 g/dL (ref 30.0–36.0)
MCV: 94.5 fL (ref 80.0–100.0)
Platelets: 207 10*3/uL (ref 150–400)
RBC: 4.69 MIL/uL (ref 4.22–5.81)
RDW: 13.4 % (ref 11.5–15.5)
WBC: 4.9 10*3/uL (ref 4.0–10.5)
nRBC: 0 % (ref 0.0–0.2)

## 2021-10-21 NOTE — ED Triage Notes (Signed)
Pt presents via POV with complaints of left sided flank pain that has been exacerbated tonight. Hx of prostate CA and has been receiving radiation therapy that has been causing him some burning with urination. His most recent radiation treatment was today. Denies CP or SOB.

## 2021-10-21 NOTE — ED Provider Notes (Signed)
[facility] Provider Note    Event Date/Time   First MD Initiated Contact with Patient 10/21/21 2339     (approximate)   History   Flank Pain   HPI  [name] is a 71 y.o. male who presents to the ED for evaluation of Flank Pain   I reviewed PCP visit from 5/24.  Symptoms of dysuria, urine culture with E. coli treated with Macrobid, sensitive to this on the culture. History of diabetes, HTN, CAD and ischemic cardiomyopathy.  Prostate cancer undergoing radiation treatments  Patient presents to the ED for evaluation of left-sided flank pain for past few hours.  Reports pain worsening since about 9 PM on 6/12.  He reports a chronic degree of dysuria since starting his radiation treatments.  Reports the dysuria got a little bit better when he took the [place] a couple weeks ago, but due to more severe now.  Also reports chronic urinary frequency.  No fevers.  Physical Exam   Triage Vital Signs: ED Triage Vitals  Enc Vitals Group     BP 10/21/21 2247 (!) 177/108     Pulse Rate 10/21/21 2247 96     Resp 10/21/21 2247 20     Temp 10/21/21 2247 98.3 F (36.8 C)     Temp Source 10/21/21 2247 Oral     SpO2 10/21/21 2247 98 %     Weight 10/21/21 2249 180 lb (81.6 kg)     Height 10/21/21 2249 '5\' 8"'$  (1.727 m)     Head Circumference --      Peak Flow --      Pain Score 10/21/21 2244 10     Pain Loc --      Pain Edu? --      Excl. in [place]? --     Most recent vital signs: Vitals:   10/22/21 0030 10/22/21 0100  BP: (!) 155/85 (!) 155/72  Pulse: (!) 110 (!) 113  Resp: 18   Temp:    SpO2: 96% (!) 80%    General: Awake, no distress.  Obviously uncomfortable. CV:  Good peripheral perfusion.  Resp:  Normal effort.  Abd:  No distention.  Suprapubic and left-sided CVA tenderness.  Upper abdomen is benign.  Right-sided abdomen is benign. MSK:  No deformity noted.  Neuro:  No focal deficits appreciated. Other:     ED Results / Procedures /  Treatments   Labs (all labs ordered are listed, but only abnormal results are displayed) Labs Reviewed  URINALYSIS, ROUTINE W REFLEX MICROSCOPIC - Abnormal; Notable for the following components:      Result Value   Color, Urine AMBER (*)    APPearance CLOUDY (*)    Glucose, UA >=500 (*)    Protein, ur 30 (*)    Nitrite POSITIVE (*)    Leukocytes,Ua LARGE (*)    WBC, UA >50 (*)    Bacteria, UA RARE (*)    All other components within normal limits  BASIC METABOLIC PANEL - Abnormal; Notable for the following components:   Glucose, Bld 416 (*)    All other components within normal limits  HEPATIC FUNCTION PANEL - Abnormal; Notable for the following components:   Indirect Bilirubin 1.0 (*)    All other components within normal limits  CULTURE, BLOOD (SINGLE)  URINE CULTURE  CBC  LACTIC ACID, PLASMA  PROCALCITONIN  LACTIC ACID, PLASMA    EKG   RADIOLOGY CT renal study interpreted by me with left-sided perinephric stranding and  hydro ureter without obstructing ureteral stone identified.  Official radiology report(s): CT Renal Stone Study  Result Date: 10/21/2021 CLINICAL DATA:  Left flank pain EXAM: CT ABDOMEN AND PELVIS WITHOUT CONTRAST TECHNIQUE: Multidetector CT imaging of the abdomen and pelvis was performed following the standard protocol without IV contrast. RADIATION DOSE REDUCTION: This exam was performed according to the departmental dose-optimization program which includes automated exposure control, adjustment of the mA and/or kV according to patient size and/or use of iterative reconstruction technique. COMPARISON:  08/25/2017 FINDINGS: Lower chest: Coronary artery calcifications.  No acute abnormality. Hepatobiliary: No focal hepatic abnormality. Gallbladder unremarkable. Pancreas: No focal abnormality or ductal dilatation. Spleen: No focal abnormality.  Normal size. Adrenals/Urinary Tract: 2.3 cm low-density left adrenal nodule again noted, unchanged since prior study  most compatible with adenoma. Right adrenal gland normal. Mild left hydronephrosis and extensive perinephric stranding around the left kidney. Left ureter mildly dilated to the bladder. No visible obstructing stones. Right hydronephrosis noted. Right ureter decompressed. 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 Punctate bilateral nonobstructing renal stones. Urinary bladder unremarkable. Stomach/Bowel: Sigmoid diverticulosis. No active diverticulitis. Stomach and small bowel decompressed, unremarkable. Normal appendix. Vascular/Lymphatic: Aortic atherosclerosis. No evidence of aneurysm or adenopathy. Reproductive: No visible focal abnormality. Other: No free fluid or free air. Small left inguinal hernia containing fat. Musculoskeletal: No acute bony abnormality. IMPRESSION: Moderate left hydroureteronephrosis with extensive perinephric stranding. No visible obstructing stones. 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 Bilateral nephrolithiasis. Sigmoid diverticulosis. Aortic atherosclerosis. Electronically Signed   By: Rolm Baptise M.D.   On: 10/21/2021 23:46    PROCEDURES and INTERVENTIONS:  .1-3 Lead EKG Interpretation  Performed by: Vladimir Crofts, MD Authorized by: Vladimir Crofts, MD     Interpretation: abnormal     ECG rate:  110   ECG rate assessment: tachycardic     Rhythm: sinus tachycardia     Ectopy: none     Conduction: normal   .Critical Care  Performed by: Vladimir Crofts, MD Authorized by: Vladimir Crofts, MD   Critical care provider statement:    Critical care time (minutes):  30   Critical care time was exclusive of:  Separately billable procedures and treating other patients   Critical care was necessary to treat or prevent imminent or life-threatening deterioration of the following conditions:  Sepsis   Critical care was time spent personally by me on the following activities:  Development of treatment plan with patient or surrogate, discussions with  consultants, evaluation of patient's response to treatment, examination of patient, ordering and review of laboratory studies, ordering and review of radiographic studies, ordering and performing treatments and interventions, pulse oximetry, re-evaluation of patient's condition and review of old charts   Medications  ondansetron (ZOFRAN) injection 4 mg (4 mg Intravenous Given 10/22/21 0054)  lactated ringers bolus 1,000 mL (0 mLs Intravenous Paused 10/22/21 0059)  morphine (PF) 4 MG/ML injection 4 mg (4 mg Intravenous Given 10/22/21 0054)  cefTRIAXone (ROCEPHIN) 1 g in sodium chloride 0.9 % 100 mL IVPB (1 g Intravenous New Bag/Given 10/22/21 0055)  HYDROmorphone (DILAUDID) injection 0.5 mg (0.5 mg Intravenous Given 10/22/21 0055)     IMPRESSION / MDM / [place] / ED COURSE  I reviewed the triage vital signs and the nursing notes.  Differential diagnosis includes, but is not limited to, ureteral stone, pyelonephritis, sepsis, diverticulitis, ureteral rupture  {Patient presents with symptoms of an acute illness or injury that is potentially life-threatening.  71 year old male undergoing radiation treatments for prostate cancer presents  to the ED with acute left-sided flank pain superimposed on chronic dysuria and urinary frequency, with evidence of pyelonephritis and requiring medical admission.  He is tachycardic but hemodynamically stable.  No fevers.  Blood work is generally reassuring with normal CBC.  Hyperglycemia is noted without acidosis or signs of DKA.  Urine with infectious features and will be sent for culture.  Empirically started on ceftriaxone.  CT renal study with perinephric stranding and hydronephrosis concerning for pyelonephritis, but no evidence of ureteral obstruction.  Requiring multiple doses of IV narcotics for appropriate pain control.  We will consult medicine for admission.  Clinical Course as of 10/22/21 0127  Tue Oct 22, 2021  0049 Reassessed.  We discussed  pyelonephritis and my recommendation for admission.  He is agreeable. [DS]  0117 I consult with medicine who agrees to admit. [DS]    Clinical Course User Index [DS] Vladimir Crofts, MD     FINAL CLINICAL IMPRESSION(S) / ED DIAGNOSES   Final diagnoses:  Pyelonephritis  Left flank pain  Dysuria     Rx / DC Orders   ED Discharge Orders     None        Note:  This document was prepared using Dragon voice recognition software and may include unintentional dictation errors.   Vladimir Crofts, MD 10/22/21 [phone]

## 2021-10-22 ENCOUNTER — Other Ambulatory Visit: Payer: Self-pay

## 2021-10-22 ENCOUNTER — Encounter: Payer: Self-pay | Admitting: Internal Medicine

## 2021-10-22 ENCOUNTER — Ambulatory Visit: Payer: Medicare Other

## 2021-10-22 DIAGNOSIS — I25110 Atherosclerotic heart disease of native coronary artery with unstable angina pectoris: Secondary | ICD-10-CM | POA: Diagnosis not present

## 2021-10-22 DIAGNOSIS — K5900 Constipation, unspecified: Secondary | ICD-10-CM | POA: Diagnosis present

## 2021-10-22 DIAGNOSIS — T402X5A Adverse effect of other opioids, initial encounter: Secondary | ICD-10-CM | POA: Diagnosis present

## 2021-10-22 DIAGNOSIS — N10 Acute tubulo-interstitial nephritis: Secondary | ICD-10-CM

## 2021-10-22 DIAGNOSIS — Z79899 Other long term (current) drug therapy: Secondary | ICD-10-CM | POA: Diagnosis not present

## 2021-10-22 DIAGNOSIS — D6959 Other secondary thrombocytopenia: Secondary | ICD-10-CM | POA: Diagnosis present

## 2021-10-22 DIAGNOSIS — A4151 Sepsis due to Escherichia coli [E. coli]: Secondary | ICD-10-CM | POA: Diagnosis present

## 2021-10-22 DIAGNOSIS — I5022 Chronic systolic (congestive) heart failure: Secondary | ICD-10-CM | POA: Diagnosis present

## 2021-10-22 DIAGNOSIS — Z85828 Personal history of other malignant neoplasm of skin: Secondary | ICD-10-CM | POA: Diagnosis not present

## 2021-10-22 DIAGNOSIS — R0902 Hypoxemia: Secondary | ICD-10-CM | POA: Diagnosis not present

## 2021-10-22 DIAGNOSIS — I2510 Atherosclerotic heart disease of native coronary artery without angina pectoris: Secondary | ICD-10-CM | POA: Diagnosis present

## 2021-10-22 DIAGNOSIS — Z91040 Latex allergy status: Secondary | ICD-10-CM | POA: Diagnosis not present

## 2021-10-22 DIAGNOSIS — E1165 Type 2 diabetes mellitus with hyperglycemia: Secondary | ICD-10-CM | POA: Diagnosis present

## 2021-10-22 DIAGNOSIS — J9601 Acute respiratory failure with hypoxia: Secondary | ICD-10-CM | POA: Diagnosis present

## 2021-10-22 DIAGNOSIS — Z955 Presence of coronary angioplasty implant and graft: Secondary | ICD-10-CM | POA: Diagnosis not present

## 2021-10-22 DIAGNOSIS — J45909 Unspecified asthma, uncomplicated: Secondary | ICD-10-CM | POA: Diagnosis present

## 2021-10-22 DIAGNOSIS — I255 Ischemic cardiomyopathy: Secondary | ICD-10-CM | POA: Diagnosis present

## 2021-10-22 DIAGNOSIS — Z888 Allergy status to other drugs, medicaments and biological substances status: Secondary | ICD-10-CM | POA: Diagnosis not present

## 2021-10-22 DIAGNOSIS — Z8042 Family history of malignant neoplasm of prostate: Secondary | ICD-10-CM | POA: Diagnosis not present

## 2021-10-22 DIAGNOSIS — N136 Pyonephrosis: Secondary | ICD-10-CM | POA: Diagnosis present

## 2021-10-22 DIAGNOSIS — Z825 Family history of asthma and other chronic lower respiratory diseases: Secondary | ICD-10-CM | POA: Diagnosis not present

## 2021-10-22 DIAGNOSIS — Z87891 Personal history of nicotine dependence: Secondary | ICD-10-CM | POA: Diagnosis not present

## 2021-10-22 DIAGNOSIS — I252 Old myocardial infarction: Secondary | ICD-10-CM | POA: Diagnosis not present

## 2021-10-22 DIAGNOSIS — I10 Essential (primary) hypertension: Secondary | ICD-10-CM | POA: Diagnosis not present

## 2021-10-22 DIAGNOSIS — Z7982 Long term (current) use of aspirin: Secondary | ICD-10-CM | POA: Diagnosis not present

## 2021-10-22 DIAGNOSIS — C61 Malignant neoplasm of prostate: Secondary | ICD-10-CM | POA: Diagnosis present

## 2021-10-22 DIAGNOSIS — G4733 Obstructive sleep apnea (adult) (pediatric): Secondary | ICD-10-CM | POA: Diagnosis present

## 2021-10-22 DIAGNOSIS — I110 Hypertensive heart disease with heart failure: Secondary | ICD-10-CM | POA: Diagnosis present

## 2021-10-22 LAB — URINALYSIS, ROUTINE W REFLEX MICROSCOPIC
Bilirubin Urine: NEGATIVE
Glucose, UA: >=500 mg/dL — AB
Hgb urine dipstick: NEGATIVE
Ketones, ur: NEGATIVE mg/dL
Nitrite: POSITIVE — AB
Protein, ur: 30 mg/dL — AB
Specific Gravity, Urine: 1.021 (ref 1.005–1.030)
WBC, UA: >50 WBC/hpf — ABNORMAL HIGH (ref 0–5)
pH: 6 (ref 5.0–8.0)

## 2021-10-22 LAB — CBG MONITORING, ED: Glucose-Capillary: 437 mg/dL — ABNORMAL HIGH (ref 70–99)

## 2021-10-22 LAB — GLUCOSE, CAPILLARY
Glucose-Capillary: 587 mg/dL (ref 70–99)
Glucose-Capillary: 555 mg/dL (ref 70–99)
Glucose-Capillary: 498 mg/dL — ABNORMAL HIGH (ref 70–99)
Glucose-Capillary: 414 mg/dL — ABNORMAL HIGH (ref 70–99)

## 2021-10-22 LAB — BLOOD CULTURE ID PANEL (REFLEXED) - BCID2

## 2021-10-22 LAB — RAD ONC ARIA SESSION SUMMARY
Course Elapsed Days: 41
Plan Fractions Treated to Date: 29
Plan Prescribed Dose Per Fraction: 2 Gy
Plan Total Fractions Prescribed: 40
Plan Total Prescribed Dose: 80 Gy
Reference Point Dosage Given to Date: 58 Gy
Reference Point Session Dosage Given: 2 Gy
Session Number: 29

## 2021-10-22 LAB — HEPATIC FUNCTION PANEL
ALT: 24 U/L (ref 0–44)
AST: 21 U/L (ref 15–41)
Albumin: 4.2 g/dL (ref 3.5–5.0)
Alkaline Phosphatase: 111 U/L (ref 38–126)
Bilirubin, Direct: 0.1 mg/dL (ref 0.0–0.2)
Indirect Bilirubin: 1 mg/dL — ABNORMAL HIGH (ref 0.3–0.9)
Total Bilirubin: 1.1 mg/dL (ref 0.3–1.2)
Total Protein: 7.9 g/dL (ref 6.5–8.1)

## 2021-10-22 LAB — LACTIC ACID, PLASMA
Lactic Acid, Venous: 1.6 mmol/L (ref 0.5–1.9)
Lactic Acid, Venous: 1.3 mmol/L (ref 0.5–1.9)

## 2021-10-22 LAB — PROCALCITONIN: Procalcitonin: <0.1 ng/mL

## 2021-10-22 LAB — HIV ANTIBODY (ROUTINE TESTING W REFLEX): HIV Screen 4th Generation wRfx: NONREACTIVE

## 2021-10-22 MED ORDER — ACETAMINOPHEN 650 MG RE SUPP: 650.0000 mg | Freq: Four times a day (QID) | RECTAL | Status: DC | PRN

## 2021-10-22 MED ORDER — TAMSULOSIN HCL 0.4 MG PO CAPS
0.4000 mg | ORAL_CAPSULE | Freq: Every day | ORAL | Status: DC
  Administered 2021-10-22 – 2021-10-24 (×3): 0.4 mg via ORAL
  Filled 2021-10-22 (×3): qty 1

## 2021-10-22 MED ORDER — FUROSEMIDE 40 MG PO TABS
40.0000 mg | ORAL_TABLET | Freq: Every day | ORAL | Status: DC
  Administered 2021-10-22 – 2021-10-25 (×4): 40 mg via ORAL
  Filled 2021-10-22 (×4): qty 1

## 2021-10-22 MED ORDER — KETOROLAC TROMETHAMINE 15 MG/ML IJ SOLN
15.0000 mg | Freq: Four times a day (QID) | INTRAMUSCULAR | Status: DC | PRN
  Administered 2021-10-22: 15 mg via INTRAVENOUS
  Filled 2021-10-22: qty 1

## 2021-10-22 MED ORDER — INSULIN GLARGINE-YFGN 100 UNIT/ML ~~LOC~~ SOLN
15.0000 [IU] | Freq: Every day | SUBCUTANEOUS | Status: DC
  Administered 2021-10-22 – 2021-10-23 (×2): 15 [IU] via SUBCUTANEOUS
  Filled 2021-10-22 (×2): qty 0.15

## 2021-10-22 MED ORDER — SODIUM CHLORIDE 0.9 % IV SOLN
1.0000 g | Freq: Once | INTRAVENOUS | Status: AC
  Administered 2021-10-22: 1 g via INTRAVENOUS
  Filled 2021-10-22: qty 10

## 2021-10-22 MED ORDER — SODIUM CHLORIDE 0.9 % IV SOLN: 1.0000 g | INTRAVENOUS | Status: DC

## 2021-10-22 MED ORDER — LACTATED RINGERS IV BOLUS
1000.0000 mL | Freq: Once | INTRAVENOUS | Status: AC
  Administered 2021-10-22: 1000 mL via INTRAVENOUS

## 2021-10-22 MED ORDER — ISOSORBIDE MONONITRATE ER 30 MG PO TB24
30.0000 mg | ORAL_TABLET | Freq: Every day | ORAL | Status: DC
  Administered 2021-10-22 – 2021-10-25 (×4): 30 mg via ORAL
  Filled 2021-10-22 (×4): qty 1

## 2021-10-22 MED ORDER — ONDANSETRON HCL 4 MG PO TABS: 4.0000 mg | ORAL_TABLET | Freq: Four times a day (QID) | ORAL | Status: DC | PRN

## 2021-10-22 MED ORDER — CARVEDILOL 6.25 MG PO TABS
6.2500 mg | ORAL_TABLET | Freq: Two times a day (BID) | ORAL | Status: DC
  Administered 2021-10-22 – 2021-10-25 (×7): 6.25 mg via ORAL
  Filled 2021-10-22 (×7): qty 1

## 2021-10-22 MED ORDER — POTASSIUM CHLORIDE CRYS ER 20 MEQ PO TBCR
20.0000 meq | EXTENDED_RELEASE_TABLET | Freq: Every day | ORAL | Status: DC
  Administered 2021-10-22: 20 meq via ORAL
  Filled 2021-10-22: qty 1

## 2021-10-22 MED ORDER — SODIUM CHLORIDE 0.9 % IV SOLN
2.0000 g | INTRAVENOUS | Status: DC
  Administered 2021-10-22 – 2021-10-23 (×2): 2 g via INTRAVENOUS
  Filled 2021-10-22 (×2): qty 2
  Filled 2021-10-22: qty 20

## 2021-10-22 MED ORDER — MORPHINE SULFATE (PF) 4 MG/ML IV SOLN
4.0000 mg | Freq: Once | INTRAVENOUS | Status: AC
  Administered 2021-10-22: 4 mg via INTRAVENOUS
  Filled 2021-10-22: qty 1

## 2021-10-22 MED ORDER — HYDROMORPHONE HCL 1 MG/ML IJ SOLN
0.5000 mg | Freq: Once | INTRAMUSCULAR | Status: AC
  Administered 2021-10-22: 0.5 mg via INTRAVENOUS
  Filled 2021-10-22: qty 0.5

## 2021-10-22 MED ORDER — ENOXAPARIN SODIUM 40 MG/0.4ML IJ SOSY
40.0000 mg | PREFILLED_SYRINGE | INTRAMUSCULAR | Status: DC
  Administered 2021-10-22 – 2021-10-25 (×4): 40 mg via SUBCUTANEOUS
  Filled 2021-10-22 (×4): qty 0.4

## 2021-10-22 MED ORDER — ATORVASTATIN CALCIUM 80 MG PO TABS
80.0000 mg | ORAL_TABLET | Freq: Every day | ORAL | Status: DC
  Administered 2021-10-22 – 2021-10-24 (×3): 80 mg via ORAL
  Filled 2021-10-22 (×4): qty 1

## 2021-10-22 MED ORDER — ONDANSETRON HCL 4 MG/2ML IJ SOLN: 4.0000 mg | Freq: Four times a day (QID) | INTRAMUSCULAR | Status: DC | PRN

## 2021-10-22 MED ORDER — MORPHINE SULFATE (PF) 2 MG/ML IV SOLN
2.0000 mg | INTRAVENOUS | Status: DC | PRN
  Administered 2021-10-22 (×2): 2 mg via INTRAVENOUS
  Filled 2021-10-22 (×2): qty 1

## 2021-10-22 MED ORDER — HYDROCODONE-ACETAMINOPHEN 5-325 MG PO TABS
1.0000 | ORAL_TABLET | ORAL | Status: DC | PRN
  Administered 2021-10-22 – 2021-10-24 (×4): 2 via ORAL
  Filled 2021-10-22 (×4): qty 2

## 2021-10-22 MED ORDER — INSULIN ASPART 100 UNIT/ML IJ SOLN
0.0000 [IU] | Freq: Three times a day (TID) | INTRAMUSCULAR | Status: DC
  Administered 2021-10-22: 15 [IU] via SUBCUTANEOUS
  Filled 2021-10-22: qty 1

## 2021-10-22 MED ORDER — INSULIN ASPART 100 UNIT/ML IJ SOLN
0.0000 [IU] | Freq: Every day | INTRAMUSCULAR | Status: DC
  Administered 2021-10-23: 5 [IU] via SUBCUTANEOUS
  Filled 2021-10-22: qty 1

## 2021-10-22 MED ORDER — ACETAMINOPHEN 325 MG PO TABS
650.0000 mg | ORAL_TABLET | Freq: Four times a day (QID) | ORAL | Status: DC | PRN
  Filled 2021-10-22: qty 2

## 2021-10-22 MED ORDER — LOSARTAN POTASSIUM 25 MG PO TABS
25.0000 mg | ORAL_TABLET | Freq: Every day | ORAL | Status: DC
  Administered 2021-10-22 – 2021-10-25 (×4): 25 mg via ORAL
  Filled 2021-10-22 (×4): qty 1

## 2021-10-22 MED ORDER — EZETIMIBE 10 MG PO TABS
10.0000 mg | ORAL_TABLET | Freq: Every day | ORAL | Status: DC
  Administered 2021-10-22 – 2021-10-25 (×4): 10 mg via ORAL
  Filled 2021-10-22 (×4): qty 1

## 2021-10-22 MED ORDER — INSULIN ASPART 100 UNIT/ML IJ SOLN: 0.0000 [IU] | Freq: Three times a day (TID) | INTRAMUSCULAR | Status: DC

## 2021-10-22 MED ORDER — SODIUM CHLORIDE 0.9 % IV SOLN: INTRAVENOUS | Status: AC

## 2021-10-22 MED ORDER — ONDANSETRON HCL 4 MG/2ML IJ SOLN
4.0000 mg | Freq: Once | INTRAMUSCULAR | Status: AC
  Administered 2021-10-22: 4 mg via INTRAVENOUS
  Filled 2021-10-22: qty 2

## 2021-10-22 NOTE — Care Plan (Signed)
This 71 years old male with PMH significant for prostate cancer on radiation therapy, CAD s/p CABG and ischemic cardiomyopathy with LVEF of 40%, OSA on CPAP, hypertension, non-insulin-dependent diabetes mellitus presented in the ED with c/o: left-sided flank pain associated with fever and chills.  Patient has been experiencing dysuria for past several weeks and was recently treated for E. coli UTI with Macrobid for 3 weeks.  Patient reports worsening symptoms.  CT abdomen showed moderate left hydroureteronephrosis with extensive perinephric stranding, no visible obstructing stones. Patient is admitted for acute pyelonephritis,  started on IV antibiotics.  Blood cultures growing gram negative rods.  Patient was seen and examined at bedside,  reports improvement in flank pain.

## 2021-10-22 NOTE — Assessment & Plan Note (Signed)
Last EF 40% Currently euvolemic but monitor for fluid overload with IV fluids given Continue carvedilol, losartan, furosemide and isosorbide

## 2021-10-22 NOTE — H&P (Signed)
History and Physical    Patient: [name] BBC:[id number] DOB: 1950-09-01 DOA: 10/21/2021 DOS: the patient was seen and examined on 10/22/2021 PCP: [name], MD  Patient coming from: Home  Chief Complaint:  Chief Complaint  Patient presents with   Flank Pain    HPI: [name] is a 71 y.o. male with medical history significant for Prostate cancer on radiation therapy , CAD s/p CABG and ischemic cardiomyopathy EF 40%, OSA on CPAP, HTN, non-insulin-dependent diabetes who presents to the ED with left-sided flank pain.  He denies fever or chills.  He has been experiencing dysuria for the past several weeks and was treated for E. coli UTI with Macrobid 3 weeks ago per review of PCP records.  He is now having worsening burning over the past several days.  He denies nausea or vomiting. ED course and data review: Afebrile with pulse up to 113, BP 177/108 O2 sat initially 98% on room air but fell to 80% after administration of Dilaudid for pain. Labs CBC and BMP significant only for glucose of 416.  Hepatic function panel unremarkable.  Procalcitonin pending.  Lactic acid 1.6.  UA strongly consistent with UTI CT renal stone study showing the following IMPRESSION: Moderate left hydroureteronephrosis with extensive perinephric stranding. No visible obstructing stones.   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   Bilateral nephrolithiasis.   Sigmoid diverticulosis.   Aortic atherosclerosis.  Patient treated with a fluid bolus, IV morphine and then Dilaudid, Rocephin.  Hospitalist consulted for admission.   Review of Systems: As mentioned in the history of present illness. All other systems reviewed and are negative.  Past Medical History:  Diagnosis Date   Actinic keratoses    Angina    Asthma    BCC (basal cell carcinoma of skin) 09/06/2020   left upper back lateral (EDC) ,  left upper arm anterior (EDC), left upper back medial (EDC)     Bursitis of left elbow 2018   Diabetes mellitus without complication [facility])    ED (erectile dysfunction)    HTN (hypertension) 04/12/2011   Hyperlipemia 04/12/2011   Ischemic cardiomyopathy    a. echo 2010: EF 45-50%, mild to mod ant and apical wall HK, trace MR; b. cardiac cath 06/2012: EF 40%, mild MR    Multiple vessel coronary artery disease 04/12/2011   a. remote MI 1996; b. MI 1997 s/p PCI - LAD & diag; c. MI 2009: long stenting P-MLAD & Diag & POBA of jailed diag branch; d. cath 2010: 80% ISR of LAD w/ L-R collats, med Rx; e. cath 2012: 50-60% tub mLAD, 60-70% dLAD, FFR 0.75 -->s/p PCI dissection => w/ 2 stents (3 total); f. cath 06/2012: no changes from 2012 cath, med Rx, patent stents -> MV CAD 11/20/20 --> CABG X 5   Myocardial infarction (HCC)    x 5   Obesity    OSA (obstructive sleep apnea)    on CPAP   Osteoarthritis    Renal disorder    kidney stone   Past Surgical History:  Procedure Laterality Date   CORONARY ANGIOPLASTY     x 5   CORONARY ARTERY BYPASS GRAFT N/A 11/20/2020   Procedure: CORONARY ARTERY BYPASS GRAFTING (CABG) x  FIVE (LIMA-dLAD, SVG-PDA, SVG-D1, SeqSVG-OM1-2) ON PUMP  USING LEFT INTERNAL MAMMARY ARTERTY AND LEFT ENDOSCOPIC GREATER SAPHENOUS VEIN CONDUITS, RIGHT LEG OPENED NOT HARVESTED;  Surgeon: Wonda Olds, MD;  Location: [place];  Service: Open Heart Surgery;  Laterality: N/A;  CORONARY STENT PLACEMENT     x 7   INTRAVASCULAR PRESSURE WIRE/FFR STUDY N/A 11/15/2020   Procedure: INTRAVASCULAR PRESSURE WIRE/FFR STUDY;  Surgeon: Nelva Bush, MD;  Location: [place] CV [facility];  Service: Cardiovascular;  Laterality: N/A;   LEFT HEART CATH AND CORONARY ANGIOGRAPHY N/A 11/15/2020   Procedure: LEFT HEART CATH AND CORONARY ANGIOGRAPHY;  Surgeon: Nelva Bush, MD;  Location: [place] [facility];  Service: Cardiovascular;  Laterality: N/A;   LEFT HEART CATHETERIZATION WITH CORONARY ANGIOGRAM N/A 04/14/2011   Procedure: LEFT HEART CATHETERIZATION WITH  CORONARY ANGIOGRAM;  Surgeon: Pixie Casino, MD;  Location: [facility] CATH LAB;  Service: Cardiovascular;  Laterality: N/A;   LEFT HEART CATHETERIZATION WITH CORONARY ANGIOGRAM N/A 06/25/2012   Procedure: LEFT HEART CATHETERIZATION WITH CORONARY ANGIOGRAM;  Surgeon: Peter M Martinique, MD;  Location: [facility] CATH LAB;  Service: Cardiovascular;  Laterality: N/A;   TEE WITHOUT CARDIOVERSION N/A 11/20/2020   Procedure: TRANSESOPHAGEAL ECHOCARDIOGRAM (TEE);  Surgeon: Wonda Olds, MD;  Location: [place];  Service: Open Heart Surgery;  Laterality: N/A;   TONSILLECTOMY AND ADENOIDECTOMY  1955   Social History:  reports that he quit smoking about 14 years ago. His smoking use included cigarettes. He has a 60.00 pack-year smoking history. He has never used smokeless tobacco. He reports that he does not drink alcohol and does not use drugs.  Allergies  Allergen Reactions   Hydroxyzine Hcl Other (See Comments)    Weakness, out of this world feeling. sluggishness   Latex Rash    I.e. Elastic= rash to blisters if worn for an extended time Patient show effects after long exposure.    Family History  Problem Relation Age of Onset   COPD Mother    Other Father        muscle tumor   Stroke Father    Prostate cancer Father        mets   Heart disease Father    Alcoholism Father    Stroke Sister    Heart block Sister        Heart bypass   Bladder Cancer Neg Hx    Kidney cancer Neg Hx     Prior to Admission medications   Medication Sig Start Date End Date Taking? Authorizing Provider  aspirin EC 325 MG EC tablet Take 1 tablet (325 mg total) by mouth daily. 11/23/20   Nani Skillern, PA-C  aspirin EC 81 MG tablet Take 81 mg by mouth daily. Swallow whole.    [provider]  atorvastatin (LIPITOR) 80 MG tablet Take 1 tablet (80 mg total) by mouth daily. 03/11/21   Minna Merritts, MD  carvedilol (COREG) 6.25 MG tablet Take 1 tablet (6.25 mg total) by mouth 2 (two) times daily with a meal.  03/11/21   Gollan, Kathlene November, MD  diphenhydramine-acetaminophen (TYLENOL PM) 25-500 MG TABS tablet Take 2 tablets by mouth at bedtime.    [provider]  Dulaglutide (TRULICITY) 1.5 MG/8.6PY SOPN Inject 1.5 mg into the skin once a week. 10/08/21 01/06/22  Burnard Hawthorne, FNP  ezetimibe (ZETIA) 10 MG tablet Take 1 tablet (10 mg total) by mouth daily. 12/07/20   Minna Merritts, MD  furosemide (LASIX) 40 MG tablet Take 1 tablet (40 mg total) by mouth daily. 03/11/21   Minna Merritts, MD  isosorbide mononitrate (IMDUR) 30 MG 24 hr tablet Take 1 tablet (30 mg total) by mouth daily. 03/11/21   Minna Merritts, MD  JARDIANCE 25 MG TABS tablet  TAKE 1 TABLET DAILY 10/04/21   [name], MD  losartan (COZAAR) 25 MG tablet Take 1 tablet (25 mg total) by mouth daily. 03/11/21 07/16/21  Minna Merritts, MD  metFORMIN (GLUCOPHAGE) 1000 MG tablet TAKE 1 TABLET TWICE A DAY WITH MEALS 10/04/21   [name], MD  Multiple Vitamins-Minerals (MULTIVITAMINS THER. W/MINERALS) TABS Take 1 tablet by mouth every morning.    [provider]  niacin (NIASPAN) 1000 MG CR tablet TAKE 2 TABLETS DAILY AT BEDTIME 10/04/21   Minna Merritts, MD  nitrofurantoin, macrocrystal-monohydrate, (MACROBID) 100 MG capsule Take 1 capsule (100 mg total) by mouth 2 (two) times daily. 10/02/21   Burnard Hawthorne, FNP  potassium chloride SA (KLOR-CON) 20 MEQ tablet Take 1 tablet (20 mEq total) by mouth daily. 03/11/21   Minna Merritts, MD  tamsulosin (FLOMAX) 0.4 MG CAPS capsule Take 1 capsule (0.4 mg total) by mouth daily after supper. 10/01/21 12/30/21  Noreene Filbert, MD    Physical Exam: Vitals:   10/21/21 2249 10/21/21 2330 10/22/21 0030 10/22/21 0100  BP:  116/75 (!) 155/85 (!) 155/72  Pulse:  100 (!) 110 (!) 113  Resp:   18   Temp:      TempSrc:      SpO2:  98% 96% (!) 80%  Weight: 81.6 kg     Height: '5\' 8"'$  (1.727 m)      Physical Exam Vitals and nursing note reviewed.   Constitutional:      General: He is not in acute distress. HENT:     Head: Normocephalic and atraumatic.  Cardiovascular:     Rate and Rhythm: Normal rate and regular rhythm.     Heart sounds: Normal heart sounds.  Pulmonary:     Effort: Pulmonary effort is normal.     Breath sounds: Normal breath sounds.  Abdominal:     Palpations: Abdomen is soft.     Tenderness: There is no abdominal tenderness.  Neurological:     Mental Status: Mental status is at baseline.     Labs on Admission: I have personally reviewed following labs and imaging studies  CBC: Recent Labs  Lab 10/16/21 1401 10/21/21 2249  WBC 5.9 4.9  HGB 13.2 14.6  HCT 39.6 44.3  MCV 94.5 94.5  PLT 211 161   Basic Metabolic Panel: Recent Labs  Lab 10/21/21 2249  NA 137  K 3.9  CL 100  CO2 27  GLUCOSE 416*  BUN 15  CREATININE 0.94  CALCIUM 10.1   GFR: Estimated Creatinine Clearance: 70.7 mL/min (by C-G formula based on SCr of 0.94 mg/dL). Liver Function Tests: Recent Labs  Lab 10/21/21 2249  AST 21  ALT 24  ALKPHOS 111  BILITOT 1.1  PROT 7.9  ALBUMIN 4.2   No results for input(s): "LIPASE", "AMYLASE" in the last 168 hours. No results for input(s): "AMMONIA" in the last 168 hours. Coagulation Profile: No results for input(s): "INR", "PROTIME" in the last 168 hours. Cardiac Enzymes: No results for input(s): "CKTOTAL", "CKMB", "CKMBINDEX", "TROPONINI" in the last 168 hours. BNP (last 3 results) No results for input(s): "PROBNP" in the last 8760 hours. HbA1C: No results for input(s): "HGBA1C" in the last 72 hours. CBG: No results for input(s): "GLUCAP" in the last 168 hours. Lipid Profile: No results for input(s): "CHOL", "HDL", "LDLCALC", "TRIG", "CHOLHDL", "LDLDIRECT" in the last 72 hours. Thyroid Function Tests: No results for input(s): "TSH", "T4TOTAL", "FREET4", "T3FREE", "THYROIDAB" in the last 72 hours. Anemia Panel: No results  for input(s): "VITAMINB12", "FOLATE", "FERRITIN",  "TIBC", "IRON", "RETICCTPCT" in the last 72 hours. Urine analysis:    Component Value Date/Time   COLORURINE AMBER (A) 10/22/2021 0020   APPEARANCEUR CLOUDY (A) 10/22/2021 0020   APPEARANCEUR Clear 07/11/2021 0925   LABSPEC 1.021 10/22/2021 0020   PHURINE 6.0 10/22/2021 0020   GLUCOSEU >=500 (A) 10/22/2021 0020   GLUCOSEU NEGATIVE 09/01/2013 0942   HGBUR NEGATIVE 10/22/2021 0020   BILIRUBINUR NEGATIVE 10/22/2021 0020   BILIRUBINUR 2+ 10/02/2021 1140   BILIRUBINUR Negative 07/11/2021 0925   KETONESUR NEGATIVE 10/22/2021 0020   PROTEINUR 30 (A) 10/22/2021 0020   UROBILINOGEN 1.0 10/02/2021 1140   UROBILINOGEN 0.2 09/01/2013 0942   NITRITE POSITIVE (A) 10/22/2021 0020   LEUKOCYTESUR LARGE (A) 10/22/2021 0020    Radiological Exams on Admission: CT Renal Stone Study  Result Date: 10/21/2021 CLINICAL DATA:  Left flank pain EXAM: CT ABDOMEN AND PELVIS WITHOUT CONTRAST TECHNIQUE: Multidetector CT imaging of the abdomen and pelvis was performed following the standard protocol without IV contrast. RADIATION DOSE REDUCTION: This exam was performed according to the departmental dose-optimization program which includes automated exposure control, adjustment of the mA and/or kV according to patient size and/or use of iterative reconstruction technique. COMPARISON:  08/25/2017 FINDINGS: Lower chest: Coronary artery calcifications.  No acute abnormality. Hepatobiliary: No focal hepatic abnormality. Gallbladder unremarkable. Pancreas: No focal abnormality or ductal dilatation. Spleen: No focal abnormality.  Normal size. Adrenals/Urinary Tract: 2.3 cm low-density left adrenal nodule again noted, unchanged since prior study most compatible with adenoma. Right adrenal gland normal. Mild left hydronephrosis and extensive perinephric stranding around the left kidney. Left ureter mildly dilated to the bladder. No visible obstructing stones. Right hydronephrosis noted. Right ureter decompressed. Configuration most  compatible with chronic UPJ obstruction. Punctate bilateral nonobstructing renal stones. Urinary bladder unremarkable. Stomach/Bowel: Sigmoid diverticulosis. No active diverticulitis. Stomach and small bowel decompressed, unremarkable. Normal appendix. Vascular/Lymphatic: Aortic atherosclerosis. No evidence of aneurysm or adenopathy. Reproductive: No visible focal abnormality. Other: No free fluid or free air. Small left inguinal hernia containing fat. Musculoskeletal: No acute bony abnormality. IMPRESSION: Moderate left hydroureteronephrosis with extensive perinephric stranding. No visible obstructing stones. 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 Bilateral nephrolithiasis. Sigmoid diverticulosis. Aortic atherosclerosis. Electronically Signed   By: Rolm Baptise M.D.   On: 10/21/2021 23:46     Data Reviewed: Relevant notes from primary care and specialist visits, past discharge summaries as available in EHR, including Care Everywhere. Prior diagnostic testing as pertinent to current admission diagnoses Updated medications and problem lists for reconciliation ED course, including vitals, labs, imaging, treatment and response to treatment Triage notes, nursing and pharmacy notes and ED provider's notes Notable results as noted in HPI   Assessment and Plan: * Acute pyelonephritis IV Rocephin and follow cultures IV fluids and pain control   Uncontrolled type 2 diabetes mellitus with hyperglycemia, without long-term current use of insulin (HCC) Blood sugar 416 Sliding scale insulin coverage Patient on multiple hypoglycemic agents and might benefit from going on insulin  Hypoxemia Patient desaturated to the 80s after administration of Dilaudid for pain in the ED Patient denies shortness of breath but will continue to monitor Continue supplemental oxygen and wean as tolerated Does have underlying sleep apnea CPAP nightly   HTN (hypertension) BP uncontrolled.  Continue home  losartan and carvedilol Hydralazine IV as needed for additional BP control to goal SBP under 130  Prostate cancer (HCC) Currently receiving radiation Last treatment was 6/12  OSA (obstructive sleep apnea) CPAP nightly  Ischemic cardiomyopathy Last EF 40% Currently euvolemic but monitor for fluid overload with IV fluids given Continue carvedilol, losartan, furosemide and isosorbide  CAD s/p CABG (coronary artery disease) No complaints of chest pain Continue aspirin, atorvastatin, carvedilol, losartan and Imdur        DVT prophylaxis: Lovenox  Consults: none  Advance Care Planning:   Code Status: Prior   Family Communication: none  Disposition Plan: Back to previous home environment  Severity of Illness: The appropriate patient status for this patient is INPATIENT. Inpatient status is judged to be reasonable and necessary in order to provide the required intensity of service to ensure the patient's safety. The patient's presenting symptoms, physical exam findings, and initial radiographic and laboratory data in the context of their chronic comorbidities is felt to place them at high risk for further clinical deterioration. Furthermore, it is not anticipated that the patient will be medically stable for discharge from the hospital within 2 midnights of admission.   * I certify that at the point of admission it is my clinical judgment that the patient will require inpatient hospital care spanning beyond 2 midnights from the point of admission due to high intensity of service, high risk for further deterioration and high frequency of surveillance required.*  Author: Athena Masse, MD 10/22/2021 1:24 AM  For on call review [email or web address].

## 2021-10-22 NOTE — Assessment & Plan Note (Signed)
BP uncontrolled.  Continue home losartan and carvedilol Hydralazine IV as needed for additional BP control to goal SBP under 130

## 2021-10-22 NOTE — ED Notes (Addendum)
Pt became hypoxic after IV pain medication. 4L/O2 placed on pt. States he does have OSA and uses a Cpap at home. O2 sats maintaining between 93-96%

## 2021-10-22 NOTE — Progress Notes (Signed)
Admission profile updated. ?

## 2021-10-22 NOTE — ED Notes (Signed)
Night shift RN obtained CBG and reached out to MD regarding increase CBG value. MD states to give sliding scale plus 2 units, this RN request order be placed prior to being given, IP RN aware.

## 2021-10-22 NOTE — Assessment & Plan Note (Addendum)
Patient desaturated to the 80s after administration of Dilaudid for pain in the ED Patient denies shortness of breath but will continue to monitor Continue supplemental oxygen and wean as tolerated Does have underlying sleep apnea CPAP nightly

## 2021-10-22 NOTE — Assessment & Plan Note (Addendum)
Currently receiving radiation Last treatment was 6/12

## 2021-10-22 NOTE — Assessment & Plan Note (Signed)
CPAP nightly

## 2021-10-22 NOTE — Assessment & Plan Note (Signed)
Blood sugar 416 Sliding scale insulin coverage Patient on multiple hypoglycemic agents and might benefit from going on insulin

## 2021-10-22 NOTE — Assessment & Plan Note (Addendum)
IV Rocephin and follow cultures IV fluids and pain control

## 2021-10-22 NOTE — Progress Notes (Signed)
PHARMACY - PHYSICIAN COMMUNICATION CRITICAL VALUE ALERT - BLOOD CULTURE IDENTIFICATION (BCID)  [name] is an 71 y.o. male who presented to [facility] on 10/21/2021 with a chief complaint of flank pain   Assessment:  Single blood culture from 6/13 with GNR,  BCID detects E Coli.  Had E coli in urine cx 5/24 (Resistant to quinolones) on nitrofurantoin.  Found to have pyelonephritis.    Name of physician (or Provider) Contacted:    Current antibiotics: Dr Madaline Brilliant  Changes to prescribed antibiotics recommended:  Patient is on recommended antibiotics - No changes needed.  Will increase dose of ceftriaxone to 2gm IV q24h   Results for orders placed or performed during the hospital encounter of 10/21/21  Blood Culture ID Panel (Reflexed) (Collected: 10/22/2021 12:35 AM)  Result Value Ref Range   Enterococcus faecalis NOT DETECTED NOT DETECTED   Enterococcus Faecium NOT DETECTED NOT DETECTED   Listeria monocytogenes NOT DETECTED NOT DETECTED   Staphylococcus species NOT DETECTED NOT DETECTED   Staphylococcus aureus (BCID) NOT DETECTED NOT DETECTED   Staphylococcus epidermidis NOT DETECTED NOT DETECTED   Staphylococcus lugdunensis NOT DETECTED NOT DETECTED   Streptococcus species NOT DETECTED NOT DETECTED   Streptococcus agalactiae NOT DETECTED NOT DETECTED   Streptococcus pneumoniae NOT DETECTED NOT DETECTED   Streptococcus pyogenes NOT DETECTED NOT DETECTED   A.calcoaceticus-baumannii NOT DETECTED NOT DETECTED   Bacteroides fragilis NOT DETECTED NOT DETECTED   Enterobacterales DETECTED (A) NOT DETECTED   Enterobacter cloacae complex NOT DETECTED NOT DETECTED   Escherichia coli DETECTED (A) NOT DETECTED   Klebsiella aerogenes NOT DETECTED NOT DETECTED   Klebsiella oxytoca NOT DETECTED NOT DETECTED   Klebsiella pneumoniae NOT DETECTED NOT DETECTED   Proteus species NOT DETECTED NOT DETECTED   Salmonella species NOT DETECTED NOT DETECTED   Serratia marcescens NOT DETECTED NOT  DETECTED   Haemophilus influenzae NOT DETECTED NOT DETECTED   Neisseria meningitidis NOT DETECTED NOT DETECTED   Pseudomonas aeruginosa NOT DETECTED NOT DETECTED   Stenotrophomonas maltophilia NOT DETECTED NOT DETECTED   Candida albicans NOT DETECTED NOT DETECTED   Candida auris NOT DETECTED NOT DETECTED   Candida glabrata NOT DETECTED NOT DETECTED   Candida krusei NOT DETECTED NOT DETECTED   Candida parapsilosis NOT DETECTED NOT DETECTED   Candida tropicalis NOT DETECTED NOT DETECTED   Cryptococcus neoformans/gattii NOT DETECTED NOT DETECTED   CTX-M ESBL NOT DETECTED NOT DETECTED   Carbapenem resistance IMP NOT DETECTED NOT DETECTED   Carbapenem resistance KPC NOT DETECTED NOT DETECTED   Carbapenem resistance NDM NOT DETECTED NOT DETECTED   Carbapenem resist OXA 48 LIKE NOT DETECTED NOT DETECTED   Carbapenem resistance VIM NOT DETECTED NOT DETECTED    Doreene Eland, PharmD, BCPS, BCIDP Work Cell: [phone] 10/22/2021 11:24 AM

## 2021-10-22 NOTE — ED Notes (Addendum)
Admitting provider contacted for instruction on how much insulin the pt needs. Sliding scale does not specify past 400, awaiting the response

## 2021-10-22 NOTE — Assessment & Plan Note (Signed)
No complaints of chest pain Continue aspirin, atorvastatin, carvedilol, losartan and Imdur

## 2021-10-22 NOTE — Inpatient Diabetes Management (Signed)
Inpatient Diabetes Program Recommendations  AACE/ADA: New Consensus Statement on Inpatient Glycemic Control (2015)  Target Ranges:  Prepandial:   less than 140 mg/dL      Peak postprandial:   less than 180 mg/dL (1-2 hours)      Critically ill patients:  140 - 180 mg/dL   Lab Results  Component Value Date   GLUCAP 437 (H) 10/22/2021   HGBA1C 8.9 (A) 10/02/2021    Review of Glycemic Control  Latest Reference Range & Units 10/22/21 06:03  Glucose-Capillary 70 - 99 mg/dL 437 (H)   Diabetes history: DM 2 Outpatient Diabetes medications: Trulicity 1.5 mg weekly, Jardiance 25 mg Daily, Metformin 1000 mg bid Current orders for Inpatient glycemic control:  Novolog 0-15 units tid + hs  Inpatient Diabetes Program Recommendations:    -  May consider Semglee 5 units  Thanks,  Tama Headings RN, MSN, BC-ADM Inpatient Diabetes Coordinator Team Pager [phone] (8a-5p)

## 2021-10-23 ENCOUNTER — Ambulatory Visit: Payer: Medicare Other

## 2021-10-23 ENCOUNTER — Other Ambulatory Visit: Payer: Self-pay

## 2021-10-23 DIAGNOSIS — I10 Essential (primary) hypertension: Secondary | ICD-10-CM

## 2021-10-23 DIAGNOSIS — N10 Acute tubulo-interstitial nephritis: Secondary | ICD-10-CM | POA: Diagnosis not present

## 2021-10-23 DIAGNOSIS — I25110 Atherosclerotic heart disease of native coronary artery with unstable angina pectoris: Secondary | ICD-10-CM

## 2021-10-23 DIAGNOSIS — G4733 Obstructive sleep apnea (adult) (pediatric): Secondary | ICD-10-CM

## 2021-10-23 DIAGNOSIS — R0902 Hypoxemia: Secondary | ICD-10-CM

## 2021-10-23 DIAGNOSIS — E1165 Type 2 diabetes mellitus with hyperglycemia: Secondary | ICD-10-CM

## 2021-10-23 DIAGNOSIS — I255 Ischemic cardiomyopathy: Secondary | ICD-10-CM

## 2021-10-23 DIAGNOSIS — C61 Malignant neoplasm of prostate: Secondary | ICD-10-CM

## 2021-10-23 LAB — RAD ONC ARIA SESSION SUMMARY
Course Elapsed Days: 42
Plan Fractions Treated to Date: 30
Plan Prescribed Dose Per Fraction: 2 Gy
Plan Total Fractions Prescribed: 40
Plan Total Prescribed Dose: 80 Gy
Reference Point Dosage Given to Date: 60 Gy
Reference Point Session Dosage Given: 2 Gy
Session Number: 30

## 2021-10-23 LAB — CBC
HCT: 35.2 % — ABNORMAL LOW (ref 39.0–52.0)
Hemoglobin: 11.9 g/dL — ABNORMAL LOW (ref 13.0–17.0)
MCH: 31.1 pg (ref 26.0–34.0)
MCHC: 33.8 g/dL (ref 30.0–36.0)
MCV: 91.9 fL (ref 80.0–100.0)
Platelets: 110 10*3/uL — ABNORMAL LOW (ref 150–400)
RBC: 3.83 MIL/uL — ABNORMAL LOW (ref 4.22–5.81)
RDW: 13.6 % (ref 11.5–15.5)
WBC: 14.1 10*3/uL — ABNORMAL HIGH (ref 4.0–10.5)
nRBC: 0 % (ref 0.0–0.2)

## 2021-10-23 LAB — BASIC METABOLIC PANEL WITH GFR
Anion gap: 8 (ref 5–15)
BUN: 28 mg/dL — ABNORMAL HIGH (ref 8–23)
CO2: 26 mmol/L (ref 22–32)
Calcium: 8.8 mg/dL — ABNORMAL LOW (ref 8.9–10.3)
Chloride: 101 mmol/L (ref 98–111)
Creatinine, Ser: 1.08 mg/dL (ref 0.61–1.24)
GFR, Estimated: >60 mL/min
Glucose, Bld: 251 mg/dL — ABNORMAL HIGH (ref 70–99)
Potassium: 3.2 mmol/L — ABNORMAL LOW (ref 3.5–5.1)
Sodium: 135 mmol/L (ref 135–145)

## 2021-10-23 LAB — GLUCOSE, CAPILLARY
Glucose-Capillary: 195 mg/dL — ABNORMAL HIGH (ref 70–99)
Glucose-Capillary: 228 mg/dL — ABNORMAL HIGH (ref 70–99)
Glucose-Capillary: 245 mg/dL — ABNORMAL HIGH (ref 70–99)
Glucose-Capillary: 252 mg/dL — ABNORMAL HIGH (ref 70–99)
Glucose-Capillary: 289 mg/dL — ABNORMAL HIGH (ref 70–99)
Glucose-Capillary: 350 mg/dL — ABNORMAL HIGH (ref 70–99)

## 2021-10-23 LAB — PHOSPHORUS: Phosphorus: 3.3 mg/dL (ref 2.5–4.6)

## 2021-10-23 LAB — PROCALCITONIN: Procalcitonin: 42.82 ng/mL

## 2021-10-23 LAB — MAGNESIUM: Magnesium: 2.3 mg/dL (ref 1.7–2.4)

## 2021-10-23 MED ORDER — INSULIN ASPART 100 UNIT/ML IJ SOLN
0.0000 [IU] | Freq: Every day | INTRAMUSCULAR | Status: DC
  Administered 2021-10-23: 3 [IU] via SUBCUTANEOUS
  Filled 2021-10-23: qty 1

## 2021-10-23 MED ORDER — INSULIN ASPART 100 UNIT/ML IJ SOLN
6.0000 [IU] | Freq: Three times a day (TID) | INTRAMUSCULAR | Status: DC
  Administered 2021-10-23 (×3): 6 [IU] via SUBCUTANEOUS
  Filled 2021-10-23 (×3): qty 1

## 2021-10-23 MED ORDER — POLYETHYLENE GLYCOL 3350 17 G PO PACK
17.0000 g | PACK | Freq: Every day | ORAL | Status: DC
  Administered 2021-10-23 – 2021-10-25 (×3): 17 g via ORAL
  Filled 2021-10-23 (×3): qty 1

## 2021-10-23 MED ORDER — INSULIN ASPART 100 UNIT/ML IJ SOLN
0.0000 [IU] | Freq: Three times a day (TID) | INTRAMUSCULAR | Status: DC
  Administered 2021-10-23: 11 [IU] via SUBCUTANEOUS
  Administered 2021-10-23: 4 [IU] via SUBCUTANEOUS
  Administered 2021-10-23: 7 [IU] via SUBCUTANEOUS
  Administered 2021-10-24: 3 [IU] via SUBCUTANEOUS
  Administered 2021-10-24: 7 [IU] via SUBCUTANEOUS
  Administered 2021-10-25: 4 [IU] via SUBCUTANEOUS
  Administered 2021-10-25: 7 [IU] via SUBCUTANEOUS
  Filled 2021-10-23 (×7): qty 1

## 2021-10-23 MED ORDER — POTASSIUM CHLORIDE CRYS ER 20 MEQ PO TBCR
40.0000 meq | EXTENDED_RELEASE_TABLET | Freq: Every day | ORAL | Status: DC
  Administered 2021-10-23 – 2021-10-25 (×3): 40 meq via ORAL
  Filled 2021-10-23 (×3): qty 2

## 2021-10-23 MED ORDER — INSULIN DETEMIR 100 UNIT/ML ~~LOC~~ SOLN
5.0000 [IU] | Freq: Once | SUBCUTANEOUS | Status: AC
  Administered 2021-10-23: 5 [IU] via SUBCUTANEOUS
  Filled 2021-10-23: qty 0.05

## 2021-10-23 MED ORDER — SENNOSIDES-DOCUSATE SODIUM 8.6-50 MG PO TABS
1.0000 | ORAL_TABLET | Freq: Two times a day (BID) | ORAL | Status: DC
  Administered 2021-10-23 – 2021-10-25 (×5): 1 via ORAL
  Filled 2021-10-23 (×5): qty 1

## 2021-10-23 MED ORDER — INSULIN GLARGINE-YFGN 100 UNIT/ML ~~LOC~~ SOLN
20.0000 [IU] | Freq: Every day | SUBCUTANEOUS | Status: DC
Start: 2021-10-24 — End: 2021-10-25
  Administered 2021-10-24 – 2021-10-25 (×2): 20 [IU] via SUBCUTANEOUS
  Filled 2021-10-23 (×2): qty 0.2

## 2021-10-23 NOTE — TOC Initial Note (Signed)
Transition of Care [facility]) - Initial/Assessment Note    Patient Details  Name: [name] MRN: [id number] Date of Birth: July 30, 1950  Transition of Care [facility]) CM/SW Contact:    Beverly Sessions, RN Phone Number: 10/23/2021, 10:00 AM  Clinical Narrative:                   Transition of Care [facility]) Screening Note   Patient Details  Name: [name] Date of Birth: 03-07-1951   Transition of Care [facility]) CM/SW Contact:    Beverly Sessions, RN Phone Number: 10/23/2021, 10:00 AM    Transition of Care Department [facility]) has reviewed patient and no TOC needs have been identified at this time. We will continue to monitor patient advancement through interdisciplinary progression rounds. If new patient transition needs arise, please place a TOC consult.         Patient Goals and CMS Choice        Expected Discharge Plan and Services                                                Prior Living Arrangements/Services                       Activities of Daily Living Home Assistive Devices/Equipment: Eyeglasses ADL Screening (condition at time of admission) Patient's cognitive ability adequate to safely complete daily activities?: Yes Is the patient deaf or have difficulty hearing?: No Does the patient have difficulty seeing, even when wearing glasses/contacts?: No Does the patient have difficulty concentrating, remembering, or making decisions?: No Patient able to express need for assistance with ADLs?: Yes Does the patient have difficulty dressing or bathing?: No Independently performs ADLs?: Yes (appropriate for developmental age) Does the patient have difficulty walking or climbing stairs?: No Weakness of Legs: None Weakness of Arms/Hands: None  Permission Sought/Granted                  Emotional Assessment              Admission diagnosis:  Dysuria [R30.0] Pyelonephritis [N12] Left flank pain [R10.9] Acute  pyelonephritis [N10] Patient Active Problem List   Diagnosis Date Noted   Dysuria 10/02/2021   Left hip pain 07/02/2021   Hearing loss 07/02/2021   Foot cramps 03/20/2021   Anxiety 12/11/2020   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   Multiple vessel coronary artery disease 11/20/2020   Colon cancer screening 10/18/2019   Bone spicules of jaw 12/27/2018   Insomnia 02/18/2018   Adrenal adenoma 03/03/2017   Right shoulder pain 03/03/2017   OSA (obstructive sleep apnea) 07/30/2016   Low back pain 05/15/2016   Actinic keratoses 02/07/2016   Right hip pain 02/07/2016   Ischemic cardiomyopathy    Uncontrolled type 2 diabetes mellitus with hyperglycemia, without long-term current use of insulin ([place]) 11/30/2014   Blood in semen 09/01/2013   Sleep disturbance 08/08/2013   CAD s/p CABG (coronary artery disease) 04/12/2011   HTN (hypertension) 04/12/2011   Hyperlipemia 04/12/2011   Acute pyelonephritis 04/12/2011   Hypoxemia 04/12/2011   Prostate cancer ([place]) 04/12/2011   PCP:  [name], MD Pharmacy:   [place], [address] [place] [address] Phone: [phone] Fax: [phone]  [id number]  [place] [address] Phone: [phone] Fax: [phone]     Social Determinants of Health (SDOH) Interventions    Readmission Risk Interventions     No data to display

## 2021-10-23 NOTE — Progress Notes (Addendum)
Progress Note  Patient: [name] [id number]:[id number] DOB: [birth year]  DOA: 10/21/2021  DOS: 10/23/2021    Brief hospital course: [name] is a 71 y.o. male with a history of prostate CA on XRT, CAD s/p CABG, ICM, chronic HFrEF (LVEF 40%), OSA on CPAP, HTN, uncontrolled T2DM who presented to the ED 6/12 with left flank pain, fever and chills despite taking macrobid for E. coli UTI PTA. Temperature 99.49F, after admission WBC up to 14.1k, PCT up to 42, CT revealing extensive left perinephric stranding with moderate left hydroureteronephrosis without obstructing stone. Blood cultures subsequently grew E. coli and the patient continues on IV ceftriaxone pending susceptibilities.    Assessment and Plan: Sepsis due to E. coli causing acute left pyelonephritis:  - Continue ceftriaxone 2g q24h pending susceptibilities. Pt is at high risk of deterioration given malignancy undergoing treatment and uncontrolled diabetes.  - Low threshold to give IVF if not taking po or becomes tachycardic/hypotensive.   Uncontrolled NIDT2DM: Severe hyperglycemia noted requiring significant insulin dosing while inpatient. Stress/infection suspected precipitant. Also ?if receiving steroids with CA treatments.  - HbA1c has been improving over previous months, though still was 8.9% on 5/24. Given this and his use of multiple oral hypoglycemic agents, suspect insulin may be required for adequate control long-term.  - Continue basal bolus insulin. Continue glargine, increase mealtime and SSI to resistant scale today.  - Patient not interested in insulin at home. Anticipate improvement with infection Tx.    Acute hypoxic respiratory failure due to hypoventilation due to opioid administration: This was fleeting and has resolved.  - Caution with sedating medications.   Prostate cancer: Undergoing radiation therapy, last Tx 6/12.   Thrombocytopenia: Acute. Fits with pattern of sepsis.  - Monitor in AM. Given high  risk of VTE w/malignancy, will continue VTE ppx for now.  OSA:  - Continue CPAP qHS  ICM, chronic HFrEF, HTN: Appears compensated presently.  - Caution with fluids despite sepsis at this time.  - Continue home regimen including carvedilol, losartan, furosemide and isosorbide  CAD s/p 5v CABG: No angina.  - Continue ASA, statin, coreg  Constipation:  - Institute regular bowel regimen  Subjective: His left flank pain is improved significantly, down from 10/10 to 2/10. Has been tachycardic with soft BP. Has increasing left abdominal pain over past 24-48 hours associated with constipation.   Objective: Vitals:   10/22/21 1458 10/22/21 1956 10/23/21 0231 10/23/21 0759  BP: (!) 102/58 (!) 95/58 (!) 98/58 107/63  Pulse: (!) 110 100 94 99  Resp:  18 18   Temp: 98.2 F (36.8 C) 97.7 F (36.5 C) 98.7 F (37.1 C) 98 F (36.7 C)  TempSrc:      SpO2: 94% 95% 95% 95%  Weight:      Height:       Gen: 71 y.o. male in no distress Pulm: Nonlabored breathing room air. Clear CV: Regular rate and rhythm. No murmur, rub, or gallop. No JVD, no pitting dependent edema. GI: Abdomen soft, tender on left with +CVA tenderness to percussion. Hypoactive bowel sounds.   Ext: Warm, no deformities Skin: No rashes, lesions or ulcers on visualized skin. Neuro: Alert and oriented. No focal neurological deficits. Psych: Judgement and insight appear fair. Mood euthymic & affect congruent. Behavior is appropriate.    Data Personally reviewed: CBC: Recent Labs  Lab 10/16/21 1401 10/21/21 2249 10/23/21 0433  WBC 5.9 4.9 14.1*  HGB 13.2 14.6 11.9*  HCT 39.6 44.3 35.2*  MCV 94.5 94.5 91.9  PLT 211 207 161*   Basic Metabolic Panel: Recent Labs  Lab 10/21/21 2249 10/23/21 0433  NA 137 135  K 3.9 3.2*  CL 100 101  CO2 27 26  GLUCOSE 416* 251*  BUN 15 28*  CREATININE 0.94 1.08  CALCIUM 10.1 8.8*  MG  --  2.3  PHOS  --  3.3   GFR: Estimated Creatinine Clearance: 61.6 mL/min (by C-G formula  based on SCr of 1.08 mg/dL). Liver Function Tests: Recent Labs  Lab 10/21/21 2249  AST 21  ALT 24  ALKPHOS 111  BILITOT 1.1  PROT 7.9  ALBUMIN 4.2   No results for input(s): "LIPASE", "AMYLASE" in the last 168 hours. No results for input(s): "AMMONIA" in the last 168 hours. Coagulation Profile: No results for input(s): "INR", "PROTIME" in the last 168 hours. Cardiac Enzymes: No results for input(s): "CKTOTAL", "CKMB", "CKMBINDEX", "TROPONINI" in the last 168 hours. BNP (last 3 results) No results for input(s): "PROBNP" in the last 8760 hours. HbA1C: No results for input(s): "HGBA1C" in the last 72 hours. CBG: Recent Labs  Lab 10/22/21 1631 10/22/21 2125 10/23/21 0013 10/23/21 0757 10/23/21 0828  GLUCAP 498* 414* 350* 228* 245*   Lipid Profile: No results for input(s): "CHOL", "HDL", "LDLCALC", "TRIG", "CHOLHDL", "LDLDIRECT" in the last 72 hours. Thyroid Function Tests: No results for input(s): "TSH", "T4TOTAL", "FREET4", "T3FREE", "THYROIDAB" in the last 72 hours. Anemia Panel: No results for input(s): "VITAMINB12", "FOLATE", "FERRITIN", "TIBC", "IRON", "RETICCTPCT" in the last 72 hours. Urine analysis:    Component Value Date/Time   COLORURINE AMBER (A) 10/22/2021 0020   APPEARANCEUR CLOUDY (A) 10/22/2021 0020   APPEARANCEUR Clear 07/11/2021 0925   LABSPEC 1.021 10/22/2021 0020   PHURINE 6.0 10/22/2021 0020   GLUCOSEU >=500 (A) 10/22/2021 0020   GLUCOSEU NEGATIVE 09/01/2013 0942   HGBUR NEGATIVE 10/22/2021 0020   BILIRUBINUR NEGATIVE 10/22/2021 0020   BILIRUBINUR 2+ 10/02/2021 1140   BILIRUBINUR Negative 07/11/2021 0925   KETONESUR NEGATIVE 10/22/2021 0020   PROTEINUR 30 (A) 10/22/2021 0020   UROBILINOGEN 1.0 10/02/2021 1140   UROBILINOGEN 0.2 09/01/2013 0942   NITRITE POSITIVE (A) 10/22/2021 0020   LEUKOCYTESUR LARGE (A) 10/22/2021 0020   Recent Results (from the past 240 hour(s))  Urine Culture     Status: Abnormal (Preliminary result)   Collection  Time: 10/22/21 12:20 AM   Specimen: Urine, Random  Result Value Ref Range Status   Specimen Description   Final    URINE, RANDOM Performed at [facility], [address], [place], [place] [address]    Special Requests   Final    NONE Performed at [facility], [address], [place], [place] [address]    Culture >=100,000 COLONIES/mL GRAM NEGATIVE RODS (A)  Final   Report Status PENDING  Incomplete  Blood culture (single)     Status: Abnormal (Preliminary result)   Collection Time: 10/22/21 12:35 AM   Specimen: BLOOD  Result Value Ref Range Status   Specimen Description   Final    BLOOD RIGHT ASSIST CONTROL Performed at [facility], [address] [place],  [address]    Special Requests   Final    BOTTLES DRAWN AEROBIC AND ANAEROBIC Blood Culture adequate volume Performed at [facility], [address] [place],  [address]    Culture  Setup Time   Final    GRAM NEGATIVE RODS IN BOTH AEROBIC AND ANAEROBIC BOTTLES CRITICAL RESULT CALLED TO, READ BACK BY AND VERIFIED WITH: KRISTIN MERRILL  PHARMD 1111 10/22/21 HNM    Culture (A)  Final    ESCHERICHIA COLI SUSCEPTIBILITIES TO FOLLOW Performed at [facility], [place] [address] [place], [place] [address]    Report Status PENDING  Incomplete  Blood Culture ID Panel (Reflexed)     Status: Abnormal   Collection Time: 10/22/21 12:35 AM  Result Value Ref Range Status   Enterococcus faecalis NOT DETECTED NOT DETECTED Final   Enterococcus Faecium NOT DETECTED NOT DETECTED Final   Listeria monocytogenes NOT DETECTED NOT DETECTED Final   Staphylococcus species NOT DETECTED NOT DETECTED Final   Staphylococcus aureus (BCID) NOT DETECTED NOT DETECTED Final   Staphylococcus epidermidis NOT DETECTED NOT DETECTED Final   Staphylococcus lugdunensis NOT DETECTED NOT DETECTED Final   Streptococcus species NOT DETECTED NOT DETECTED Final   Streptococcus agalactiae NOT DETECTED NOT  DETECTED Final   Streptococcus pneumoniae NOT DETECTED NOT DETECTED Final   Streptococcus pyogenes NOT DETECTED NOT DETECTED Final   A.calcoaceticus-baumannii NOT DETECTED NOT DETECTED Final   Bacteroides fragilis NOT DETECTED NOT DETECTED Final   Enterobacterales DETECTED (A) NOT DETECTED Final    Comment: Enterobacterales represent a large order of gram negative bacteria, not a single organism. CRITICAL RESULT CALLED TO, READ BACK BY AND VERIFIED WITH: KRISTIN MERRILL [id number] 10/22/21 HNM    Enterobacter cloacae complex NOT DETECTED NOT DETECTED Final   Escherichia coli DETECTED (A) NOT DETECTED Final    Comment: CRITICAL RESULT CALLED TO, READ BACK BY AND VERIFIED WITH: Chinita Greenland [id number] 10/22/21 HNM    Klebsiella aerogenes NOT DETECTED NOT DETECTED Final   Klebsiella oxytoca NOT DETECTED NOT DETECTED Final   Klebsiella pneumoniae NOT DETECTED NOT DETECTED Final   Proteus species NOT DETECTED NOT DETECTED Final   Salmonella species NOT DETECTED NOT DETECTED Final   Serratia marcescens NOT DETECTED NOT DETECTED Final   Haemophilus influenzae NOT DETECTED NOT DETECTED Final   Neisseria meningitidis NOT DETECTED NOT DETECTED Final   Pseudomonas aeruginosa NOT DETECTED NOT DETECTED Final   Stenotrophomonas maltophilia NOT DETECTED NOT DETECTED Final   Candida albicans NOT DETECTED NOT DETECTED Final   Candida auris NOT DETECTED NOT DETECTED Final   Candida glabrata NOT DETECTED NOT DETECTED Final   Candida krusei NOT DETECTED NOT DETECTED Final   Candida parapsilosis NOT DETECTED NOT DETECTED Final   Candida tropicalis NOT DETECTED NOT DETECTED Final   Cryptococcus neoformans/gattii NOT DETECTED NOT DETECTED Final   CTX-M ESBL NOT DETECTED NOT DETECTED Final   Carbapenem resistance IMP NOT DETECTED NOT DETECTED Final   Carbapenem resistance KPC NOT DETECTED NOT DETECTED Final   Carbapenem resistance NDM NOT DETECTED NOT DETECTED Final   Carbapenem resist OXA 48 LIKE NOT  DETECTED NOT DETECTED Final   Carbapenem resistance VIM NOT DETECTED NOT DETECTED Final    Comment: Performed at [facility], [address] [place], [place] [address]     CT Renal Stone Study  Result Date: 10/21/2021 CLINICAL DATA:  Left flank pain EXAM: CT ABDOMEN AND PELVIS WITHOUT CONTRAST TECHNIQUE: Multidetector CT imaging of the abdomen and pelvis was performed following the standard protocol without IV contrast. RADIATION DOSE REDUCTION: This exam was performed according to the departmental dose-optimization program which includes automated exposure control, adjustment of the mA and/or kV according to patient size and/or use of iterative reconstruction technique. COMPARISON:  08/25/2017 FINDINGS: Lower chest: Coronary artery calcifications.  No acute abnormality. Hepatobiliary: No focal hepatic abnormality. Gallbladder unremarkable. Pancreas: No focal abnormality or ductal dilatation.  Spleen: No focal abnormality.  Normal size. Adrenals/Urinary Tract: 2.3 cm low-density left adrenal nodule again noted, unchanged since prior study most compatible with adenoma. Right adrenal gland normal. Mild left hydronephrosis and extensive perinephric stranding around the left kidney. Left ureter mildly dilated to the bladder. No visible obstructing stones. Right hydronephrosis noted. Right ureter decompressed. 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 Punctate bilateral nonobstructing renal stones. Urinary bladder unremarkable. Stomach/Bowel: Sigmoid diverticulosis. No active diverticulitis. Stomach and small bowel decompressed, unremarkable. Normal appendix. Vascular/Lymphatic: Aortic atherosclerosis. No evidence of aneurysm or adenopathy. Reproductive: No visible focal abnormality. Other: No free fluid or free air. Small left inguinal hernia containing fat. Musculoskeletal: No acute bony abnormality. IMPRESSION: Moderate left hydroureteronephrosis with extensive perinephric stranding.  No visible obstructing stones. 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 Bilateral nephrolithiasis. Sigmoid diverticulosis. Aortic atherosclerosis. Electronically Signed   By: Rolm Baptise M.D.   On: 10/21/2021 23:46    Family Communication: None at bedside this AM  Disposition: Status is: Inpatient Remains inpatient appropriate because: Continue IV antibiotics pending clinical stability Planned Discharge Destination: Home  Patrecia Pour, MD 10/23/2021 10:09 AM Page by [email or web address]

## 2021-10-23 NOTE — Inpatient Diabetes Management (Addendum)
Inpatient Diabetes Program Recommendations  AACE/ADA: New Consensus Statement on Inpatient Glycemic Control (2015)  Target Ranges:  Prepandial:   less than 140 mg/dL      Peak postprandial:   less than 180 mg/dL (1-2 hours)      Critically ill patients:  140 - 180 mg/dL   Lab Results  Component Value Date   GLUCAP 245 (H) 10/23/2021   HGBA1C 8.9 (A) 10/02/2021    Review of Glycemic Control  Latest Reference Range & Units 10/22/21 21:25 10/23/21 00:13 10/23/21 07:57 10/23/21 08:28  Glucose-Capillary 70 - 99 mg/dL 414 (H) 350 (H) 228 (H) 245 (H)  (H): Data is abnormally high  Diabetes history: DM 2 Outpatient Diabetes medications: Trulicity 1.5 mg weekly, Jardiance 25 mg Daily, Metformin 1000 mg bid Current orders for Inpatient glycemic control:  Novolog 0-20 units tid + hs + Novolog 6 units TID + Semglee 15 units QD  Inpatient Diabetes Program Recommendations:    Semglee 20 units QD (could give additional 5 now)  May need insulin going home?   Addendum'@1113'$ : Spoke with patient at bedside.  His last A1C was 8.9% (average BG of 203 mg/dL) on 10/02/21.  He is current with his PCP.  Confirms above medications.  He states his A1C is down from 11%.  Is not interested in insulin going home.  Hopefully glucose will come down with treatment of infection.    He has a glucometer at home and checks daily.  He has been drinking a lot of cranberry juice.  Explained there is a lot of sugar in cranberry juice.    Currently under treatment for prostate cancer/radiation.  No steroids noted in chart review.    Will continue to follow while inpatient.  Thank you, Isaac Dixon, MSN, [place] [facility] Coordinator Inpatient Diabetes Program [phone] (team pager from 8a-5p)

## 2021-10-24 ENCOUNTER — Encounter: Payer: Self-pay | Admitting: Internal Medicine

## 2021-10-24 ENCOUNTER — Other Ambulatory Visit (HOSPITAL_COMMUNITY): Payer: Self-pay

## 2021-10-24 ENCOUNTER — Other Ambulatory Visit: Payer: Self-pay

## 2021-10-24 ENCOUNTER — Ambulatory Visit: Payer: Medicare Other

## 2021-10-24 DIAGNOSIS — C61 Malignant neoplasm of prostate: Secondary | ICD-10-CM | POA: Diagnosis not present

## 2021-10-24 DIAGNOSIS — I25110 Atherosclerotic heart disease of native coronary artery with unstable angina pectoris: Secondary | ICD-10-CM | POA: Diagnosis not present

## 2021-10-24 DIAGNOSIS — N10 Acute tubulo-interstitial nephritis: Secondary | ICD-10-CM | POA: Diagnosis not present

## 2021-10-24 DIAGNOSIS — R0902 Hypoxemia: Secondary | ICD-10-CM | POA: Diagnosis not present

## 2021-10-24 DIAGNOSIS — I10 Essential (primary) hypertension: Secondary | ICD-10-CM | POA: Diagnosis not present

## 2021-10-24 LAB — BASIC METABOLIC PANEL
Anion gap: 6 (ref 5–15)
BUN: 26 mg/dL — ABNORMAL HIGH (ref 8–23)
CO2: 29 mmol/L (ref 22–32)
Calcium: 9.1 mg/dL (ref 8.9–10.3)
Chloride: 104 mmol/L (ref 98–111)
Creatinine, Ser: 0.85 mg/dL (ref 0.61–1.24)
GFR, Estimated: >60 mL/min (ref >60)
Glucose, Bld: 133 mg/dL — ABNORMAL HIGH (ref 70–99)
Potassium: 3.9 mmol/L (ref 3.5–5.1)
Sodium: 139 mmol/L (ref 135–145)

## 2021-10-24 LAB — CBC
HCT: 38.6 % — ABNORMAL LOW (ref 39.0–52.0)
Hemoglobin: 12.8 g/dL — ABNORMAL LOW (ref 13.0–17.0)
MCH: 31 pg (ref 26.0–34.0)
MCHC: 33.2 g/dL (ref 30.0–36.0)
MCV: 93.5 fL (ref 80.0–100.0)
Platelets: 128 10*3/uL — ABNORMAL LOW (ref 150–400)
RBC: 4.13 MIL/uL — ABNORMAL LOW (ref 4.22–5.81)
RDW: 13.7 % (ref 11.5–15.5)
WBC: 10 10*3/uL (ref 4.0–10.5)
nRBC: 0 % (ref 0.0–0.2)

## 2021-10-24 LAB — CULTURE, BLOOD (SINGLE): Special Requests: ADEQUATE

## 2021-10-24 LAB — RAD ONC ARIA SESSION SUMMARY
Course Elapsed Days: 43
Plan Fractions Treated to Date: 31
Plan Prescribed Dose Per Fraction: 2 Gy
Plan Total Fractions Prescribed: 40
Plan Total Prescribed Dose: 80 Gy
Reference Point Dosage Given to Date: 62 Gy
Reference Point Session Dosage Given: 2 Gy
Session Number: 31

## 2021-10-24 LAB — URINE CULTURE: Culture: >=100000 — AB

## 2021-10-24 LAB — GLUCOSE, CAPILLARY
Glucose-Capillary: 129 mg/dL — ABNORMAL HIGH (ref 70–99)
Glucose-Capillary: 221 mg/dL — ABNORMAL HIGH (ref 70–99)
Glucose-Capillary: 98 mg/dL (ref 70–99)
Glucose-Capillary: 163 mg/dL — ABNORMAL HIGH (ref 70–99)

## 2021-10-24 LAB — PROCALCITONIN: Procalcitonin: 24.48 ng/mL

## 2021-10-24 MED ORDER — CEFAZOLIN SODIUM-DEXTROSE 2-4 GM/100ML-% IV SOLN
2.0000 g | Freq: Three times a day (TID) | INTRAVENOUS | Status: DC
  Administered 2021-10-24 – 2021-10-25 (×3): 2 g via INTRAVENOUS
  Filled 2021-10-24 (×4): qty 100

## 2021-10-24 MED ORDER — INSULIN ASPART 100 UNIT/ML IJ SOLN
8.0000 [IU] | Freq: Three times a day (TID) | INTRAMUSCULAR | Status: DC
  Administered 2021-10-24 – 2021-10-25 (×4): 8 [IU] via SUBCUTANEOUS
  Filled 2021-10-24 (×4): qty 1

## 2021-10-24 NOTE — Progress Notes (Signed)
Progress Note  Patient: [name] [id number]:[id number] DOB: [birth year]  DOA: 10/21/2021  DOS: 10/24/2021    Brief hospital course: [name] is a 71 y.o. male with a history of prostate CA on XRT, CAD s/p CABG, ICM, chronic HFrEF (LVEF 40%), OSA on CPAP, HTN, uncontrolled T2DM who presented to the ED 6/12 with left flank pain, fever and chills despite taking macrobid for E. coli UTI PTA. Temperature 99.35F, after admission WBC up to 14.1k, PCT up to 42, CT revealing extensive left perinephric stranding with moderate left hydroureteronephrosis without obstructing stone. Blood cultures subsequently grew E. coli and the patient continues on IV ceftriaxone pending susceptibilities.    Assessment and Plan: Sepsis due to E. coli bacteremia and acute left pyelonephritis:  - Continue IV antibiotics given severity of infection. D/w pharmacy, transition ceftriaxone 2g q24h > ancef 2g q8h while here. PCT and WBC declining, still having chills and pain. Will plan for transition to oral culture-informed abx at discharge, possible 6/16.  Uncontrolled NIDT2DM: Severe hyperglycemia noted requiring significant insulin dosing while inpatient. Stress/infection suspected precipitant. Also ?if receiving steroids with CA treatments.  - HbA1c has been improving over previous months, though still was 8.9% on 5/24. Given this and his use of multiple oral hypoglycemic agents, suspect insulin may be required for adequate control long-term. Discussed with patient who states needle phobia resolved since being on trulicity, amenable to insulin. For simplicity and maximizing adherence, will plan to start once daily basal insulin. Benefits check performed, will plan on lantus solostar pens, likely 20u daily to avoid hypoglycemia as stress/infection is improved, CBGs will decrease.  - Fasting CBG 129 this AM. Will keep glargine at 20u daily and increase mealtime to 8u TIDWC + resistant SSI while admitted.    Acute  hypoxic respiratory failure due to hypoventilation due to opioid administration: This was fleeting and has resolved.  - Caution with sedating medications.   Prostate cancer: Undergoing radiation therapy, continuing during admission.  Thrombocytopenia: Acute, improving without bleeding. Fits with pattern of sepsis.  - Monitoring. Given high risk of VTE w/malignancy, will continue VTE ppx for now.  OSA:  - Continue CPAP qHS  ICM, chronic HFrEF, HTN: Appears compensated presently.  - Caution with fluids. Continues to appear euvolemic today. - Continue home regimen including carvedilol, losartan, furosemide and isosorbide  CAD s/p 5v CABG: No angina.  - Continue ASA, statin, coreg  Constipation:  - Instituted regular bowel regimen  Subjective: Left flank pain overall improved but recurred severely last night associated with chills all improved with vicodin. No new complaints this AM.   Objective: Vitals:   10/23/21 1631 10/23/21 1943 10/24/21 0432 10/24/21 0804  BP: 107/75 104/61 118/76 118/76  Pulse: 91 94 89 91  Resp: '16 20 18 18  '$ Temp: 98.2 F (36.8 C) 98.2 F (36.8 C) 98 F (36.7 C) 98.1 F (36.7 C)  TempSrc: Oral     SpO2: 97% 93% 95% 97%  Weight:      Height:       Gen: 71 y.o. male in no distress Pulm: Nonlabored breathing room air. Clear. CV: Regular rate and rhythm. No murmur, rub, or gallop. No JVD, no dependent edema. GI: Abdomen soft, non-tender, non-distended, with normoactive bowel sounds. +left CVA tenderness to palpation and laterally Ext: Warm, no deformities Skin: No rashes, lesions or ulcers on visualized skin. Neuro: Alert and oriented. No focal neurological deficits. Psych: Judgement and insight appear fair. Mood euthymic & affect congruent. Behavior is appropriate.  Data Personally reviewed: CBC: Recent Labs  Lab 10/21/21 2249 10/23/21 0433 10/24/21 0354  WBC 4.9 14.1* 10.0  HGB 14.6 11.9* 12.8*  HCT 44.3 35.2* 38.6*  MCV 94.5 91.9 93.5   PLT 207 110* 563*   Basic Metabolic Panel: Recent Labs  Lab 10/21/21 2249 10/23/21 0433 10/24/21 0354  NA 137 135 139  K 3.9 3.2* 3.9  CL 100 101 104  CO2 '27 26 29  '$ GLUCOSE 416* 251* 133*  BUN 15 28* 26*  CREATININE 0.94 1.08 0.85  CALCIUM 10.1 8.8* 9.1  MG  --  2.3  --   PHOS  --  3.3  --    Liver Function Tests: Recent Labs  Lab 10/21/21 2249  AST 21  ALT 24  ALKPHOS 111  BILITOT 1.1  PROT 7.9  ALBUMIN 4.2   CBG: Recent Labs  Lab 10/23/21 1142 10/23/21 1635 10/23/21 2108 10/24/21 0805 10/24/21 1149  GLUCAP 289* 195* 252* 129* 221*   Urine analysis:    Component Value Date/Time   COLORURINE AMBER (A) 10/22/2021 0020   APPEARANCEUR CLOUDY (A) 10/22/2021 0020   APPEARANCEUR Clear 07/11/2021 0925   LABSPEC 1.021 10/22/2021 0020   PHURINE 6.0 10/22/2021 0020   GLUCOSEU >=500 (A) 10/22/2021 0020   GLUCOSEU NEGATIVE 09/01/2013 0942   HGBUR NEGATIVE 10/22/2021 0020   BILIRUBINUR NEGATIVE 10/22/2021 0020   BILIRUBINUR 2+ 10/02/2021 1140   BILIRUBINUR Negative 07/11/2021 0925   KETONESUR NEGATIVE 10/22/2021 0020   PROTEINUR 30 (A) 10/22/2021 0020   UROBILINOGEN 1.0 10/02/2021 1140   UROBILINOGEN 0.2 09/01/2013 0942   NITRITE POSITIVE (A) 10/22/2021 0020   LEUKOCYTESUR LARGE (A) 10/22/2021 0020   Recent Results (from the past 240 hour(s))  Urine Culture     Status: Abnormal   Collection Time: 10/22/21 12:20 AM   Specimen: Urine, Random  Result Value Ref Range Status   Specimen Description   Final    URINE, RANDOM Performed at [facility], [address] [place], [place] [address]    Special Requests   Final    NONE Performed at [facility], [address] [place], [place] [address]    Culture >=100,000 COLONIES/mL ESCHERICHIA COLI (A)  Final   Report Status 10/24/2021 FINAL  Final   Organism ID, Bacteria ESCHERICHIA COLI (A)  Final      Susceptibility   Escherichia coli - MIC*    AMPICILLIN 4 SENSITIVE Sensitive      CEFAZOLIN <=4 SENSITIVE Sensitive     CEFEPIME <=0.12 SENSITIVE Sensitive     CEFTRIAXONE <=0.25 SENSITIVE Sensitive     CIPROFLOXACIN >=4 RESISTANT Resistant     GENTAMICIN <=1 SENSITIVE Sensitive     IMIPENEM <=0.25 SENSITIVE Sensitive     NITROFURANTOIN <=16 SENSITIVE Sensitive     TRIMETH/SULFA <=20 SENSITIVE Sensitive     AMPICILLIN/SULBACTAM <=2 SENSITIVE Sensitive     PIP/TAZO <=4 SENSITIVE Sensitive     * >=100,000 COLONIES/mL ESCHERICHIA COLI  Blood culture (single)     Status: Abnormal   Collection Time: 10/22/21 12:35 AM   Specimen: BLOOD  Result Value Ref Range Status   Specimen Description   Final    BLOOD RIGHT ASSIST CONTROL Performed at [facility], [address], [place], [place] [address]    Special Requests   Final    BOTTLES DRAWN AEROBIC AND ANAEROBIC Blood Culture adequate volume Performed at [facility], [address], [place], [place] [address]    Culture  Setup Time  Final    GRAM NEGATIVE RODS IN BOTH AEROBIC AND ANAEROBIC BOTTLES CRITICAL RESULT CALLED TO, READ BACK BY AND VERIFIED WITHChinita Greenland [facility] 1914 10/22/21 HNM Performed at [facility] [address], [place], [place] [address]    Culture ESCHERICHIA COLI (A)  Final   Report Status 10/24/2021 FINAL  Final   Organism ID, Bacteria ESCHERICHIA COLI  Final      Susceptibility   Escherichia coli - MIC*    AMPICILLIN 4 SENSITIVE Sensitive     CEFAZOLIN <=4 SENSITIVE Sensitive     CEFEPIME <=0.12 SENSITIVE Sensitive     CEFTAZIDIME <=1 SENSITIVE Sensitive     CEFTRIAXONE <=0.25 SENSITIVE Sensitive     CIPROFLOXACIN >=4 RESISTANT Resistant     GENTAMICIN <=1 SENSITIVE Sensitive     IMIPENEM <=0.25 SENSITIVE Sensitive     TRIMETH/SULFA <=20 SENSITIVE Sensitive     AMPICILLIN/SULBACTAM <=2 SENSITIVE Sensitive     PIP/TAZO <=4 SENSITIVE Sensitive     * ESCHERICHIA COLI  Blood Culture ID Panel (Reflexed)     Status: Abnormal   Collection Time: 10/22/21  12:35 AM  Result Value Ref Range Status   Enterococcus faecalis NOT DETECTED NOT DETECTED Final   Enterococcus Faecium NOT DETECTED NOT DETECTED Final   Listeria monocytogenes NOT DETECTED NOT DETECTED Final   Staphylococcus species NOT DETECTED NOT DETECTED Final   Staphylococcus aureus (BCID) NOT DETECTED NOT DETECTED Final   Staphylococcus epidermidis NOT DETECTED NOT DETECTED Final   Staphylococcus lugdunensis NOT DETECTED NOT DETECTED Final   Streptococcus species NOT DETECTED NOT DETECTED Final   Streptococcus agalactiae NOT DETECTED NOT DETECTED Final   Streptococcus pneumoniae NOT DETECTED NOT DETECTED Final   Streptococcus pyogenes NOT DETECTED NOT DETECTED Final   A.calcoaceticus-baumannii NOT DETECTED NOT DETECTED Final   Bacteroides fragilis NOT DETECTED NOT DETECTED Final   Enterobacterales DETECTED (A) NOT DETECTED Final    Comment: Enterobacterales represent a large order of gram negative bacteria, not a single organism. CRITICAL RESULT CALLED TO, READ BACK BY AND VERIFIED WITH: KRISTIN MERRILL PHARMD [id number] 10/22/21 HNM    Enterobacter cloacae complex NOT DETECTED NOT DETECTED Final   Escherichia coli DETECTED (A) NOT DETECTED Final    Comment: CRITICAL RESULT CALLED TO, READ BACK BY AND VERIFIED WITH: Chinita Greenland PHARMD [id number] 10/22/21 HNM    Klebsiella aerogenes NOT DETECTED NOT DETECTED Final   Klebsiella oxytoca NOT DETECTED NOT DETECTED Final   Klebsiella pneumoniae NOT DETECTED NOT DETECTED Final   Proteus species NOT DETECTED NOT DETECTED Final   Salmonella species NOT DETECTED NOT DETECTED Final   Serratia marcescens NOT DETECTED NOT DETECTED Final   Haemophilus influenzae NOT DETECTED NOT DETECTED Final   Neisseria meningitidis NOT DETECTED NOT DETECTED Final   Pseudomonas aeruginosa NOT DETECTED NOT DETECTED Final   Stenotrophomonas maltophilia NOT DETECTED NOT DETECTED Final   Candida albicans NOT DETECTED NOT DETECTED Final   Candida auris NOT DETECTED  NOT DETECTED Final   Candida glabrata NOT DETECTED NOT DETECTED Final   Candida krusei NOT DETECTED NOT DETECTED Final   Candida parapsilosis NOT DETECTED NOT DETECTED Final   Candida tropicalis NOT DETECTED NOT DETECTED Final   Cryptococcus neoformans/gattii NOT DETECTED NOT DETECTED Final   CTX-M ESBL NOT DETECTED NOT DETECTED Final   Carbapenem resistance IMP NOT DETECTED NOT DETECTED Final   Carbapenem resistance KPC NOT DETECTED NOT DETECTED Final   Carbapenem resistance NDM NOT DETECTED NOT DETECTED Final   Carbapenem resist OXA 48 LIKE NOT  DETECTED NOT DETECTED Final   Carbapenem resistance VIM NOT DETECTED NOT DETECTED Final    Comment: Performed at [facility], [address] [place], [place] [address]     No results found.  Family Communication: Spouse at bedside this AM  Disposition: Status is: Inpatient Remains inpatient appropriate because: Continue IV antibiotics pending clinical stability Planned Discharge Destination: Home  Patrecia Pour, MD 10/24/2021 12:50 PM Page by [email or web address]

## 2021-10-24 NOTE — TOC Benefit Eligibility Note (Signed)
Patient Teacher, English as a foreign language completed.    The patient is currently admitted and upon discharge could be taking Lantus Solostar Pens.  The current 30 day co-pay is, $14.00.   The patient is insured through [employer], [place] Patient Advocate Specialist [place] Patient Advocate Team Direct Number: [phone]  Fax: [phone]

## 2021-10-25 ENCOUNTER — Ambulatory Visit: Payer: Medicare Other

## 2021-10-25 ENCOUNTER — Other Ambulatory Visit: Payer: Self-pay

## 2021-10-25 DIAGNOSIS — I255 Ischemic cardiomyopathy: Secondary | ICD-10-CM | POA: Diagnosis not present

## 2021-10-25 DIAGNOSIS — I10 Essential (primary) hypertension: Secondary | ICD-10-CM | POA: Diagnosis not present

## 2021-10-25 DIAGNOSIS — N10 Acute tubulo-interstitial nephritis: Secondary | ICD-10-CM | POA: Diagnosis not present

## 2021-10-25 DIAGNOSIS — I25110 Atherosclerotic heart disease of native coronary artery with unstable angina pectoris: Secondary | ICD-10-CM | POA: Diagnosis not present

## 2021-10-25 LAB — RAD ONC ARIA SESSION SUMMARY
Course Elapsed Days: 44
Plan Fractions Treated to Date: 32
Plan Prescribed Dose Per Fraction: 2 Gy
Plan Total Fractions Prescribed: 40
Plan Total Prescribed Dose: 80 Gy
Reference Point Dosage Given to Date: 64 Gy
Reference Point Session Dosage Given: 2 Gy
Session Number: 32

## 2021-10-25 LAB — CBC
HCT: 38.7 % — ABNORMAL LOW (ref 39.0–52.0)
Hemoglobin: 12.9 g/dL — ABNORMAL LOW (ref 13.0–17.0)
MCH: 30.6 pg (ref 26.0–34.0)
MCHC: 33.3 g/dL (ref 30.0–36.0)
MCV: 91.7 fL (ref 80.0–100.0)
Platelets: 130 10*3/uL — ABNORMAL LOW (ref 150–400)
RBC: 4.22 MIL/uL (ref 4.22–5.81)
RDW: 13.5 % (ref 11.5–15.5)
WBC: 6.7 10*3/uL (ref 4.0–10.5)
nRBC: 0 % (ref 0.0–0.2)

## 2021-10-25 LAB — BASIC METABOLIC PANEL
Anion gap: 8 (ref 5–15)
BUN: 17 mg/dL (ref 8–23)
CO2: 24 mmol/L (ref 22–32)
Calcium: 8.9 mg/dL (ref 8.9–10.3)
Chloride: 105 mmol/L (ref 98–111)
Creatinine, Ser: 0.76 mg/dL (ref 0.61–1.24)
GFR, Estimated: >60 mL/min (ref >60)
Glucose, Bld: 159 mg/dL — ABNORMAL HIGH (ref 70–99)
Potassium: 3.9 mmol/L (ref 3.5–5.1)
Sodium: 137 mmol/L (ref 135–145)

## 2021-10-25 LAB — GLUCOSE, CAPILLARY
Glucose-Capillary: 167 mg/dL — ABNORMAL HIGH (ref 70–99)
Glucose-Capillary: 225 mg/dL — ABNORMAL HIGH (ref 70–99)

## 2021-10-25 LAB — PROCALCITONIN: Procalcitonin: 11.38 ng/mL

## 2021-10-25 MED ORDER — INSULIN GLARGINE 100 UNIT/ML SOLOSTAR PEN: 25.0000 [IU] | PEN_INJECTOR | Freq: Every day | SUBCUTANEOUS | 0 refills | Status: DC

## 2021-10-25 MED ORDER — INSULIN PEN NEEDLE 31G X 5 MM MISC: 0 refills | Status: AC

## 2021-10-25 MED ORDER — CEFADROXIL 500 MG PO CAPS
1000.0000 mg | ORAL_CAPSULE | Freq: Two times a day (BID) | ORAL | Status: DC
  Administered 2021-10-25: 1000 mg via ORAL
  Filled 2021-10-25: qty 2

## 2021-10-25 MED ORDER — BISACODYL 10 MG RE SUPP
10.0000 mg | Freq: Once | RECTAL | Status: AC
  Administered 2021-10-25: 10 mg via RECTAL
  Filled 2021-10-25: qty 1

## 2021-10-25 MED ORDER — BLOOD GLUCOSE MONITOR KIT: PACK | 0 refills | Status: AC

## 2021-10-25 MED ORDER — INSULIN PEN NEEDLE 31G X 5 MM MISC: 0 refills | Status: DC

## 2021-10-25 MED ORDER — OXYCODONE-ACETAMINOPHEN 5-325 MG PO TABS: 1.0000 | ORAL_TABLET | Freq: Three times a day (TID) | ORAL | 0 refills | Status: DC | PRN

## 2021-10-25 MED ORDER — SORBITOL 70 % SOLN
60.0000 mL | Freq: Once | Status: AC
Start: 2021-10-25 — End: 2021-10-25
  Administered 2021-10-25: 60 mL via ORAL
  Filled 2021-10-25: qty 60
  Filled 2021-10-25: qty 30

## 2021-10-25 MED ORDER — CEFADROXIL 1 G PO TABS
1.0000 g | ORAL_TABLET | Freq: Two times a day (BID) | ORAL | 0 refills | Status: DC
Start: 2021-10-25 — End: 2021-10-25

## 2021-10-25 MED ORDER — CEFADROXIL 500 MG PO CAPS: 1000.0000 mg | ORAL_CAPSULE | Freq: Two times a day (BID) | ORAL | 0 refills | Status: AC

## 2021-10-25 MED ORDER — BLOOD GLUCOSE MONITOR KIT: PACK | 0 refills | Status: DC

## 2021-10-25 NOTE — Care Management Important Message (Signed)
Important Message  Patient Details  Name: Copper Basnett MRN: [id number] Date of Birth: Dec 28, 1950   Medicare Important Message Given:  Yes     Dannette Barbara 10/25/2021, 12:49 PM

## 2021-10-25 NOTE — Progress Notes (Signed)
Mobility Specialist - Progress Note    10/25/21 0900  Mobility  Activity Ambulated independently in hallway  Level of Assistance Independent  Assistive Device None  Distance Ambulated (ft) 1000 ft  Activity Response Tolerated well  $Mobility charge 1 Mobility     Merrily Brittle Mobility Specialist 10/25/21, 9:45 AM

## 2021-10-25 NOTE — Discharge Summary (Signed)
Physician Discharge Summary   Patient: [name] MRN: [id number] DOB: [birth year]  Admit date:     10/21/2021  Discharge date: 10/25/21  Discharge Physician: Patrecia Pour   PCP: [name], MD   Recommendations at discharge:  Complete antibiotics for E. coli bacteremia and pyelonephritis with cefadroxil 1g BID for 10 more days.  Follow up with PCP concerning T2DM, started on lantus 20u daily with instructions to check CBGs TID. Holding jardiance while treating UTI.  Continue radiation therapy per routine for prostate CA.  Discharge Diagnoses: Principal Problem:   Acute pyelonephritis Active Problems:   Uncontrolled type 2 diabetes mellitus with hyperglycemia, without long-term current use of insulin (HCC)   HTN (hypertension)   Hypoxemia   CAD s/p CABG (coronary artery disease)   Ischemic cardiomyopathy   OSA (obstructive sleep apnea)   Prostate cancer [facility])  Hospital Course: [name] is a 71 y.o. male with a history of prostate CA on XRT, CAD s/p CABG, ICM, chronic HFrEF (LVEF 40%), OSA on CPAP, HTN, uncontrolled T2DM who presented to the ED 6/12 with left flank pain, fever and chills despite taking macrobid for E. coli UTI PTA. Temperature 99.54F, after admission WBC up to 14.1k, PCT up to 42, CT revealing extensive left perinephric stranding with moderate left hydroureteronephrosis without obstructing stone. Blood cultures subsequently grew E. coli and the patient continued on IV ceftriaxone until susceptibilities returned, informing choice of cefadroxil to complete treatment as outpatient.   Blood sugar was elevated up to 570m/dl for which insulin was administered and titrated. Based on requirements as an inpatient, he was discharged on new insulin, lantus 20u daily with directions stressing PCP follow up.  Assessment and Plan: Sepsis due to E. coli bacteremia and acute left pyelonephritis:  - Based on susceptibilities, will discharge on cefadroxil  to complete 14 days total Tx.     Uncontrolled NIDT2DM: Severe hyperglycemia noted requiring significant insulin dosing while inpatient. Stress/infection suspected precipitant. - HbA1c has been improving over previous months, though still was 8.9% on 5/24. Given this and his use of multiple oral hypoglycemic agents, suspect insulin may be required for adequate control long-term. Discussed with patient who states needle phobia resolved since being on trulicity, amenable to insulin. For simplicity and maximizing adherence, discharged on once daily basal insulin. Benefits check performed, will plan on lantus solostar pens 20u daily to avoid hypoglycemia as stress/infection is improved, CBGs will decrease.  - Hold jardiance in setting of UTI - Continue other Tx.    Acute hypoxic respiratory failure due to hypoventilation due to opioid administration: This was fleeting and has resolved.  - Caution with sedating medications.    Prostate cancer: Undergoing radiation therapy, continued during admission.   Thrombocytopenia: Acute, improving without bleeding. Fits with pattern of sepsis.  - Consider recheck at follow up   OSA:  - Continue CPAP qHS   ICM, chronic HFrEF, HTN: Continues to appear euvolemic. - Continue home regimen including carvedilol, losartan, furosemide and isosorbide   CAD s/p 5v CABG: No angina.  - Continue ASA, statin, coreg   Constipation: Abd exam benign, +flatus. - Continue regular bowel regimen going forward.  Consultants: None Procedures performed: None  Disposition: Home Diet recommendation:  Carb modified diet DISCHARGE MEDICATION: Allergies as of 10/25/2021       Reactions   Hydroxyzine Hcl Other (See Comments)   Weakness, out of this world feeling. sluggishness   Latex Rash   I.e. Elastic= rash to blisters if worn for  an extended time Patient show effects after long exposure.        Medication List     STOP taking these medications    Jardiance 25  MG Tabs tablet Generic drug: empagliflozin   nitrofurantoin (macrocrystal-monohydrate) 100 MG capsule Commonly known as: Macrobid       TAKE these medications    aspirin EC 325 MG tablet Take 1 tablet (325 mg total) by mouth daily. What changed: Another medication with the same name was removed. Continue taking this medication, and follow the directions you see here.   atorvastatin 80 MG tablet Commonly known as: LIPITOR Take 1 tablet (80 mg total) by mouth daily.   blood glucose meter kit and supplies Kit Dispense based on patient and insurance preference. Use up to four times daily as directed.   carvedilol 6.25 MG tablet Commonly known as: COREG Take 1 tablet (6.25 mg total) by mouth 2 (two) times daily with a meal.   cefadroxil 1 g tablet Commonly known as: DURICEF Take 1 tablet (1 g total) by mouth 2 (two) times daily.   diphenhydramine-acetaminophen 25-500 MG Tabs tablet Commonly known as: TYLENOL PM Take 2 tablets by mouth at bedtime.   ezetimibe 10 MG tablet Commonly known as: ZETIA Take 1 tablet (10 mg total) by mouth daily.   furosemide 40 MG tablet Commonly known as: LASIX Take 1 tablet (40 mg total) by mouth daily.   insulin glargine 100 UNIT/ML Solostar Pen Commonly known as: LANTUS Inject 25 Units into the skin daily.   Insulin Pen Needle 31G X 5 MM Misc Inject insulin via insulin pen daily   isosorbide mononitrate 30 MG 24 hr tablet Commonly known as: IMDUR Take 1 tablet (30 mg total) by mouth daily.   losartan 25 MG tablet Commonly known as: COZAAR Take 1 tablet (25 mg total) by mouth daily.   metFORMIN 1000 MG tablet Commonly known as: GLUCOPHAGE TAKE 1 TABLET TWICE A DAY WITH MEALS   multivitamins ther. w/minerals Tabs tablet Take 1 tablet by mouth every morning.   niacin 1000 MG CR tablet Commonly known as: NIASPAN TAKE 2 TABLETS DAILY AT BEDTIME   oxyCODONE-acetaminophen 5-325 MG tablet Commonly known as: Percocet Take 1 tablet  by mouth every 8 (eight) hours as needed for severe pain.   potassium chloride SA 20 MEQ tablet Commonly known as: KLOR-CON M Take 1 tablet (20 mEq total) by mouth daily.   tamsulosin 0.4 MG Caps capsule Commonly known as: Flomax Take 1 capsule (0.4 mg total) by mouth daily after supper.   Trulicity 1.5 NO/7.0JG Sopn Generic drug: Dulaglutide Inject 1.5 mg into the skin once a week.        Follow-up Information     [name], MD Follow up.   Specialty: Family Medicine Contact information: [place] [address] [phone]         Minna Merritts, MD .   Specialty: Cardiology Contact information: [place] [address] [phone]                Discharge Exam: Danley Danker Weights   10/21/21 2249  Weight: 81.6 kg  BP 118/80 (BP Location: Left Arm)   Pulse 99   Temp 98.2 F (36.8 C)   Resp 18   Ht _0  (1.727 m)   Wt 81.6 kg   SpO2 95%   BMI 27.37 kg/m   No distress, discussed care with him while walking laps  around nurses station.  Clear, nonlabored RRR without murmur or edema +BS, soft, NT, ND. Mild far lateral tenderness which is improved.  Condition at discharge: stable  The results of significant diagnostics from this hospitalization (including imaging, microbiology, ancillary and laboratory) are listed below for reference.   Imaging Studies: CT Renal Stone Study  Result Date: 10/21/2021 CLINICAL DATA:  Left flank pain EXAM: CT ABDOMEN AND PELVIS WITHOUT CONTRAST TECHNIQUE: Multidetector CT imaging of the abdomen and pelvis was performed following the standard protocol without IV contrast. RADIATION DOSE REDUCTION: This exam was performed according to the departmental dose-optimization program which includes automated exposure control, adjustment of the mA and/or kV according to patient size and/or use of iterative reconstruction technique. COMPARISON:  08/25/2017 FINDINGS: Lower  chest: Coronary artery calcifications.  No acute abnormality. Hepatobiliary: No focal hepatic abnormality. Gallbladder unremarkable. Pancreas: No focal abnormality or ductal dilatation. Spleen: No focal abnormality.  Normal size. Adrenals/Urinary Tract: 2.3 cm low-density left adrenal nodule again noted, unchanged since prior study most compatible with adenoma. Right adrenal gland normal. Mild left hydronephrosis and extensive perinephric stranding around the left kidney. Left ureter mildly dilated to the bladder. No visible obstructing stones. Right hydronephrosis noted. Right ureter decompressed. 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 Punctate bilateral nonobstructing renal stones. Urinary bladder unremarkable. Stomach/Bowel: Sigmoid diverticulosis. No active diverticulitis. Stomach and small bowel decompressed, unremarkable. Normal appendix. Vascular/Lymphatic: Aortic atherosclerosis. No evidence of aneurysm or adenopathy. Reproductive: No visible focal abnormality. Other: No free fluid or free air. Small left inguinal hernia containing fat. Musculoskeletal: No acute bony abnormality. IMPRESSION: Moderate left hydroureteronephrosis with extensive perinephric stranding. No visible obstructing stones. 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 Bilateral nephrolithiasis. Sigmoid diverticulosis. Aortic atherosclerosis. Electronically Signed   By: Rolm Baptise M.D.   On: 10/21/2021 23:46    Microbiology: Results for orders placed or performed during the hospital encounter of 10/21/21  Urine Culture     Status: Abnormal   Collection Time: 10/22/21 12:20 AM   Specimen: Urine, Random  Result Value Ref Range Status   Specimen Description   Final    URINE, RANDOM Performed at [facility], [address], [place], [place] [address]    Special Requests   Final    NONE Performed at [facility], [address] [place],  [address]    Culture  >=100,000 COLONIES/mL ESCHERICHIA COLI (A)  Final   Report Status 10/24/2021 FINAL  Final   Organism ID, Bacteria ESCHERICHIA COLI (A)  Final      Susceptibility   Escherichia coli - MIC*    AMPICILLIN 4 SENSITIVE Sensitive     CEFAZOLIN <=4 SENSITIVE Sensitive     CEFEPIME <=0.12 SENSITIVE Sensitive     CEFTRIAXONE <=0.25 SENSITIVE Sensitive     CIPROFLOXACIN >=4 RESISTANT Resistant     GENTAMICIN <=1 SENSITIVE Sensitive     IMIPENEM <=0.25 SENSITIVE Sensitive     NITROFURANTOIN <=16 SENSITIVE Sensitive     TRIMETH/SULFA <=20 SENSITIVE Sensitive     AMPICILLIN/SULBACTAM <=2 SENSITIVE Sensitive     PIP/TAZO <=4 SENSITIVE Sensitive     * >=100,000 COLONIES/mL ESCHERICHIA COLI  Blood culture (single)     Status: Abnormal   Collection Time: 10/22/21 12:35 AM   Specimen: BLOOD  Result Value Ref Range Status   Specimen Description   Final    BLOOD RIGHT ASSIST CONTROL Performed at [facility], [address] [place],  [address]    Special Requests   Final  BOTTLES DRAWN AEROBIC AND ANAEROBIC Blood Culture adequate volume Performed at [facility], [address] [place], [place] [address]    Culture  Setup Time   Final    GRAM NEGATIVE RODS IN BOTH AEROBIC AND ANAEROBIC BOTTLES CRITICAL RESULT CALLED TO, READ BACK BY AND VERIFIED WITHChinita Greenland [facility] 6045 10/22/21 HNM Performed at [facility], [place] [address] [place], [place] [address]    Culture ESCHERICHIA COLI (A)  Final   Report Status 10/24/2021 FINAL  Final   Organism ID, Bacteria ESCHERICHIA COLI  Final      Susceptibility   Escherichia coli - MIC*    AMPICILLIN 4 SENSITIVE Sensitive     CEFAZOLIN <=4 SENSITIVE Sensitive     CEFEPIME <=0.12 SENSITIVE Sensitive     CEFTAZIDIME <=1 SENSITIVE Sensitive     CEFTRIAXONE <=0.25 SENSITIVE Sensitive     CIPROFLOXACIN >=4 RESISTANT Resistant     GENTAMICIN <=1 SENSITIVE Sensitive     IMIPENEM <=0.25 SENSITIVE Sensitive      TRIMETH/SULFA <=20 SENSITIVE Sensitive     AMPICILLIN/SULBACTAM <=2 SENSITIVE Sensitive     PIP/TAZO <=4 SENSITIVE Sensitive     * ESCHERICHIA COLI  Blood Culture ID Panel (Reflexed)     Status: Abnormal   Collection Time: 10/22/21 12:35 AM  Result Value Ref Range Status   Enterococcus faecalis NOT DETECTED NOT DETECTED Final   Enterococcus Faecium NOT DETECTED NOT DETECTED Final   Listeria monocytogenes NOT DETECTED NOT DETECTED Final   Staphylococcus species NOT DETECTED NOT DETECTED Final   Staphylococcus aureus (BCID) NOT DETECTED NOT DETECTED Final   Staphylococcus epidermidis NOT DETECTED NOT DETECTED Final   Staphylococcus lugdunensis NOT DETECTED NOT DETECTED Final   Streptococcus species NOT DETECTED NOT DETECTED Final   Streptococcus agalactiae NOT DETECTED NOT DETECTED Final   Streptococcus pneumoniae NOT DETECTED NOT DETECTED Final   Streptococcus pyogenes NOT DETECTED NOT DETECTED Final   A.calcoaceticus-baumannii NOT DETECTED NOT DETECTED Final   Bacteroides fragilis NOT DETECTED NOT DETECTED Final   Enterobacterales DETECTED (A) NOT DETECTED Final    Comment: Enterobacterales represent a large order of gram negative bacteria, not a single organism. CRITICAL RESULT CALLED TO, READ BACK BY AND VERIFIED WITH: KRISTIN MERRILL PHARMD [id number] 10/22/21 HNM    Enterobacter cloacae complex NOT DETECTED NOT DETECTED Final   Escherichia coli DETECTED (A) NOT DETECTED Final    Comment: CRITICAL RESULT CALLED TO, READ BACK BY AND VERIFIED WITH: Chinita Greenland PHARMD [id number] 10/22/21 HNM    Klebsiella aerogenes NOT DETECTED NOT DETECTED Final   Klebsiella oxytoca NOT DETECTED NOT DETECTED Final   Klebsiella pneumoniae NOT DETECTED NOT DETECTED Final   Proteus species NOT DETECTED NOT DETECTED Final   Salmonella species NOT DETECTED NOT DETECTED Final   Serratia marcescens NOT DETECTED NOT DETECTED Final   Haemophilus influenzae NOT DETECTED NOT DETECTED Final   Neisseria meningitidis  NOT DETECTED NOT DETECTED Final   Pseudomonas aeruginosa NOT DETECTED NOT DETECTED Final   Stenotrophomonas maltophilia NOT DETECTED NOT DETECTED Final   Candida albicans NOT DETECTED NOT DETECTED Final   Candida auris NOT DETECTED NOT DETECTED Final   Candida glabrata NOT DETECTED NOT DETECTED Final   Candida krusei NOT DETECTED NOT DETECTED Final   Candida parapsilosis NOT DETECTED NOT DETECTED Final   Candida tropicalis NOT DETECTED NOT DETECTED Final   Cryptococcus neoformans/gattii NOT DETECTED NOT DETECTED Final   CTX-M ESBL NOT DETECTED NOT DETECTED Final   Carbapenem resistance IMP NOT DETECTED NOT  DETECTED Final   Carbapenem resistance KPC NOT DETECTED NOT DETECTED Final   Carbapenem resistance NDM NOT DETECTED NOT DETECTED Final   Carbapenem resist OXA 48 LIKE NOT DETECTED NOT DETECTED Final   Carbapenem resistance VIM NOT DETECTED NOT DETECTED Final    Comment: Performed at [facility], [address] [place], [place] [address]    Labs: CBC: Recent Labs  Lab 10/21/21 2249 10/23/21 0433 10/24/21 0354 10/25/21 0405  WBC 4.9 14.1* 10.0 6.7  HGB 14.6 11.9* 12.8* 12.9*  HCT 44.3 35.2* 38.6* 38.7*  MCV 94.5 91.9 93.5 91.7  PLT 207 110* 128* 842*   Basic Metabolic Panel: Recent Labs  Lab 10/21/21 2249 10/23/21 0433 10/24/21 0354 10/25/21 0405  NA 137 135 139 137  K 3.9 3.2* 3.9 3.9  CL 100 101 104 105  CO2 _0 GLUCOSE 416* 251* 133* 159*  BUN 15 28* 26* 17  CREATININE 0.94 1.08 0.85 0.76  CALCIUM 10.1 8.8* 9.1 8.9  MG  --  2.3  --   --   PHOS  --  3.3  --   --    Liver Function Tests: Recent Labs  Lab 10/21/21 2249  AST 21  ALT 24  ALKPHOS 111  BILITOT 1.1  PROT 7.9  ALBUMIN 4.2   CBG: Recent Labs  Lab 10/24/21 1149 10/24/21 1640 10/24/21 2100 10/25/21 0747 10/25/21 1136  GLUCAP 221* 98 163* 167* 225*    Discharge time spent: greater than 30 minutes.  Signed: Patrecia Pour, MD [facility] 10/25/2021

## 2021-10-25 NOTE — Progress Notes (Signed)
Patient discharged to home after radiation treatment and patient had successful bowel movement.  PIV removed with tip intact.  Patietn discharge instruction reviewed and given to patient who verbalized understanding.

## 2021-10-25 NOTE — Progress Notes (Signed)
Mobility Specialist - Progress Note    10/25/21 1300  Mobility  Activity Ambulated independently in hallway  Level of Assistance Independent  Assistive Device None  Distance Ambulated (ft) 320 ft  Activity Response Tolerated well  $Mobility charge 1 Mobility    Merrily Brittle Mobility Specialist 10/25/21, 1:35 PM

## 2021-10-28 ENCOUNTER — Other Ambulatory Visit: Payer: Self-pay

## 2021-10-28 ENCOUNTER — Ambulatory Visit
Admission: RE | Admit: 2021-10-28 | Discharge: 2021-10-28 | Disposition: A | Discharge status: Home / Self Care | Payer: Medicare Other | Source: Ambulatory Visit | Attending: Radiation Oncology | Admitting: Radiation Oncology

## 2021-10-28 ENCOUNTER — Telehealth: Payer: Self-pay

## 2021-10-28 DIAGNOSIS — C61 Malignant neoplasm of prostate: Secondary | ICD-10-CM | POA: Diagnosis not present

## 2021-10-28 LAB — RAD ONC ARIA SESSION SUMMARY
Course Elapsed Days: 47
Plan Fractions Treated to Date: 33
Plan Prescribed Dose Per Fraction: 2 Gy
Plan Total Fractions Prescribed: 40
Plan Total Prescribed Dose: 80 Gy
Reference Point Dosage Given to Date: 66 Gy
Reference Point Session Dosage Given: 2 Gy
Session Number: 33

## 2021-10-28 NOTE — Telephone Encounter (Signed)
Transition Care Management Unsuccessful Follow-up Telephone Call  Date of discharge and from where:  10/25/21 [facility]  Attempts:  1st Attempt  Reason for unsuccessful TCM follow-up call:  Agrees to contact office and schedule for hfu at earliest convenience. Unable to discus at this time. Will follow.

## 2021-10-29 ENCOUNTER — Other Ambulatory Visit: Payer: Self-pay

## 2021-10-29 ENCOUNTER — Ambulatory Visit
Admission: RE | Admit: 2021-10-29 | Discharge: 2021-10-29 | Disposition: A | Discharge status: Home / Self Care | Payer: Medicare Other | Source: Ambulatory Visit | Attending: Radiation Oncology | Admitting: Radiation Oncology

## 2021-10-29 DIAGNOSIS — C61 Malignant neoplasm of prostate: Secondary | ICD-10-CM | POA: Diagnosis not present

## 2021-10-29 LAB — RAD ONC ARIA SESSION SUMMARY
Course Elapsed Days: 48
Plan Fractions Treated to Date: 34
Plan Prescribed Dose Per Fraction: 2 Gy
Plan Total Fractions Prescribed: 40
Plan Total Prescribed Dose: 80 Gy
Reference Point Dosage Given to Date: 68 Gy
Reference Point Session Dosage Given: 2 Gy
Session Number: 34

## 2021-10-29 NOTE — Telephone Encounter (Signed)
Transition Care Management Unsuccessful Follow-up Telephone Call  Date of discharge and from where:  10/25/21 [facility]  Attempts:  2nd Attempt  Reason for unsuccessful TCM follow-up call:  No answer/busy. Unable  to leave voicemail. Will follow as appropriate.

## 2021-10-30 ENCOUNTER — Other Ambulatory Visit: Payer: Self-pay

## 2021-10-30 ENCOUNTER — Ambulatory Visit
Admission: RE | Admit: 2021-10-30 | Discharge: 2021-10-30 | Disposition: A | Discharge status: Home / Self Care | Payer: Medicare Other | Source: Ambulatory Visit | Attending: Radiation Oncology | Admitting: Radiation Oncology

## 2021-10-30 DIAGNOSIS — C61 Malignant neoplasm of prostate: Secondary | ICD-10-CM | POA: Diagnosis not present

## 2021-10-30 LAB — RAD ONC ARIA SESSION SUMMARY
Course Elapsed Days: 49
Plan Fractions Treated to Date: 35
Plan Prescribed Dose Per Fraction: 2 Gy
Plan Total Fractions Prescribed: 40
Plan Total Prescribed Dose: 80 Gy
Reference Point Dosage Given to Date: 70 Gy
Reference Point Session Dosage Given: 2 Gy
Session Number: 35

## 2021-10-30 NOTE — Telephone Encounter (Signed)
Transition Care Management Unsuccessful Follow-up Telephone Call  Date of discharge and from where:  10/25/21 [facility]  Attempts:  3rd Attempt  Reason for unsuccessful TCM follow-up call:  Unable to reach patient. No further calls from nurse made at this time. This closes TCM attempts for follow up.

## 2021-10-31 ENCOUNTER — Other Ambulatory Visit: Payer: Self-pay

## 2021-10-31 ENCOUNTER — Ambulatory Visit
Admission: RE | Admit: 2021-10-31 | Discharge: 2021-10-31 | Disposition: A | Discharge status: Home / Self Care | Payer: Medicare Other | Source: Ambulatory Visit | Attending: Radiation Oncology | Admitting: Radiation Oncology

## 2021-10-31 DIAGNOSIS — C61 Malignant neoplasm of prostate: Secondary | ICD-10-CM | POA: Diagnosis not present

## 2021-10-31 LAB — RAD ONC ARIA SESSION SUMMARY
Course Elapsed Days: 50
Plan Fractions Treated to Date: 36
Plan Prescribed Dose Per Fraction: 2 Gy
Plan Total Fractions Prescribed: 40
Plan Total Prescribed Dose: 80 Gy
Reference Point Dosage Given to Date: 72 Gy
Reference Point Session Dosage Given: 2 Gy
Session Number: 36

## 2021-11-01 ENCOUNTER — Other Ambulatory Visit: Payer: Self-pay

## 2021-11-01 ENCOUNTER — Ambulatory Visit
Admission: RE | Admit: 2021-11-01 | Discharge: 2021-11-01 | Disposition: A | Discharge status: Home / Self Care | Payer: Medicare Other | Source: Ambulatory Visit | Attending: Radiation Oncology | Admitting: Radiation Oncology

## 2021-11-01 DIAGNOSIS — C61 Malignant neoplasm of prostate: Secondary | ICD-10-CM | POA: Diagnosis not present

## 2021-11-01 LAB — RAD ONC ARIA SESSION SUMMARY
Course Elapsed Days: 51
Plan Fractions Treated to Date: 37
Plan Prescribed Dose Per Fraction: 2 Gy
Plan Total Fractions Prescribed: 40
Plan Total Prescribed Dose: 80 Gy
Reference Point Dosage Given to Date: 74 Gy
Reference Point Session Dosage Given: 2 Gy
Session Number: 37

## 2021-11-04 ENCOUNTER — Ambulatory Visit
Admission: RE | Admit: 2021-11-04 | Discharge: 2021-11-04 | Disposition: A | Discharge status: Home / Self Care | Payer: Medicare Other | Source: Ambulatory Visit | Attending: Radiation Oncology | Admitting: Radiation Oncology

## 2021-11-04 ENCOUNTER — Other Ambulatory Visit: Payer: Self-pay

## 2021-11-04 DIAGNOSIS — C61 Malignant neoplasm of prostate: Secondary | ICD-10-CM | POA: Diagnosis not present

## 2021-11-04 LAB — RAD ONC ARIA SESSION SUMMARY
Course Elapsed Days: 54
Plan Fractions Treated to Date: 38
Plan Prescribed Dose Per Fraction: 2 Gy
Plan Total Fractions Prescribed: 40
Plan Total Prescribed Dose: 80 Gy
Reference Point Dosage Given to Date: 76 Gy
Reference Point Session Dosage Given: 2 Gy
Session Number: 38

## 2021-11-05 ENCOUNTER — Other Ambulatory Visit: Payer: Self-pay

## 2021-11-05 ENCOUNTER — Ambulatory Visit
Admission: RE | Admit: 2021-11-05 | Discharge: 2021-11-05 | Disposition: A | Discharge status: Home / Self Care | Payer: Medicare Other | Source: Ambulatory Visit | Attending: Radiation Oncology | Admitting: Radiation Oncology

## 2021-11-05 DIAGNOSIS — C61 Malignant neoplasm of prostate: Secondary | ICD-10-CM | POA: Diagnosis not present

## 2021-11-05 LAB — RAD ONC ARIA SESSION SUMMARY
Course Elapsed Days: 55
Plan Fractions Treated to Date: 39
Plan Prescribed Dose Per Fraction: 2 Gy
Plan Total Fractions Prescribed: 40
Plan Total Prescribed Dose: 80 Gy
Reference Point Dosage Given to Date: 78 Gy
Reference Point Session Dosage Given: 2 Gy
Session Number: 39

## 2021-11-06 ENCOUNTER — Other Ambulatory Visit: Payer: Self-pay

## 2021-11-06 ENCOUNTER — Ambulatory Visit
Admission: RE | Admit: 2021-11-06 | Discharge: 2021-11-06 | Disposition: A | Discharge status: Home / Self Care | Payer: Medicare Other | Source: Ambulatory Visit | Attending: Radiation Oncology | Admitting: Radiation Oncology

## 2021-11-06 ENCOUNTER — Ambulatory Visit: Payer: Medicare Other

## 2021-11-06 ENCOUNTER — Inpatient Hospital Stay: Payer: Medicare Other

## 2021-11-06 DIAGNOSIS — C61 Malignant neoplasm of prostate: Secondary | ICD-10-CM | POA: Diagnosis not present

## 2021-11-06 LAB — RAD ONC ARIA SESSION SUMMARY
Course Elapsed Days: 56
Plan Fractions Treated to Date: 40
Plan Prescribed Dose Per Fraction: 2 Gy
Plan Total Fractions Prescribed: 40
Plan Total Prescribed Dose: 80 Gy
Reference Point Dosage Given to Date: 80 Gy
Reference Point Session Dosage Given: 2 Gy
Session Number: 40

## 2021-11-06 LAB — CBC
HCT: 35.9 % — ABNORMAL LOW (ref 39.0–52.0)
Hemoglobin: 12 g/dL — ABNORMAL LOW (ref 13.0–17.0)
MCH: 31.7 pg (ref 26.0–34.0)
MCHC: 33.4 g/dL (ref 30.0–36.0)
MCV: 94.7 fL (ref 80.0–100.0)
Platelets: 176 10*3/uL (ref 150–400)
RBC: 3.79 MIL/uL — ABNORMAL LOW (ref 4.22–5.81)
RDW: 13.6 % (ref 11.5–15.5)
WBC: 4.5 10*3/uL (ref 4.0–10.5)
nRBC: 0 % (ref 0.0–0.2)

## 2021-11-07 ENCOUNTER — Ambulatory Visit: Payer: Medicare Other

## 2021-11-08 ENCOUNTER — Ambulatory Visit: Payer: Medicare Other

## 2021-11-11 ENCOUNTER — Ambulatory Visit (INDEPENDENT_AMBULATORY_CARE_PROVIDER_SITE_OTHER): Payer: Medicare Other | Admitting: Family Medicine

## 2021-11-11 ENCOUNTER — Encounter: Payer: Self-pay | Admitting: Family Medicine

## 2021-11-11 VITALS — BP 118/70 | HR 90 | Temp 98.1°F | Ht 66.0 in | Wt 190.8 lb

## 2021-11-11 DIAGNOSIS — G4733 Obstructive sleep apnea (adult) (pediatric): Secondary | ICD-10-CM

## 2021-11-11 DIAGNOSIS — C61 Malignant neoplasm of prostate: Secondary | ICD-10-CM

## 2021-11-11 DIAGNOSIS — N10 Acute tubulo-interstitial nephritis: Secondary | ICD-10-CM

## 2021-11-11 DIAGNOSIS — E1165 Type 2 diabetes mellitus with hyperglycemia: Secondary | ICD-10-CM | POA: Diagnosis not present

## 2021-11-11 DIAGNOSIS — D649 Anemia, unspecified: Secondary | ICD-10-CM

## 2021-11-11 LAB — POCT URINALYSIS DIPSTICK
Bilirubin, UA: NEGATIVE
Glucose, UA: NEGATIVE
Ketones, UA: NEGATIVE
Nitrite, UA: NEGATIVE
Protein, UA: NEGATIVE
Spec Grav, UA: 1.015 (ref 1.010–1.025)
Urobilinogen, UA: 0.2 E.U./dL
pH, UA: 5.5 (ref 5.0–8.0)

## 2021-11-11 LAB — URINALYSIS, MICROSCOPIC ONLY: RBC / HPF: NONE SEEN (ref 0–?)

## 2021-11-11 MED ORDER — TRULICITY 3 MG/0.5ML ~~LOC~~ SOAJ: 3.0000 mg | SUBCUTANEOUS | 3 refills | Status: DC

## 2021-11-11 NOTE — Assessment & Plan Note (Addendum)
Remains uncontrolled.  We will increase his Trulicity to 3 mg weekly.  He will continue Lantus 25 units daily.  He will monitor for increasing hypoglycemia and if that occurs he will let us know.  Monitor for nausea with the increased dose of Trulicity.

## 2021-11-11 NOTE — Assessment & Plan Note (Signed)
Mild noted on labs.  Potentially could be related to recent illness or radiation treatment or some other cause.  Plan to recheck in a few weeks with iron studies.

## 2021-11-11 NOTE — Assessment & Plan Note (Signed)
Patient has requested a new CPAP.  He needs an updated sleep study.  Order placed.

## 2021-11-11 NOTE — Assessment & Plan Note (Signed)
I suspect his continued dysuria, frequency, and urgency are related to his radiation treatment.  We are going to check a urinalysis today.  He will continue to follow with his urologist.

## 2021-11-11 NOTE — Patient Instructions (Addendum)
Nice to see you. We will check your urine today and contact you with the results. We will increase your Trulicity to 3 mg weekly.  If you develop excessive nausea with this please let us know. Somebody will contact you to get your sleep study scheduled. We will have you return in a few weeks for labs.

## 2021-11-11 NOTE — Progress Notes (Signed)
[name], MD Phone: [phone]  [name] is a 71 y.o. male who presents today for f/u.  Pyelonephritis: Patient recently hospitalized for pyelonephritis and bacteremia.  He notes he was initially treated with Macrobid as an outpatient though had progressive symptoms.  He was subsequently treated with IV antibiotics in the hospital and transitioned to cefadroxil at discharge.  He continues to have some dysuria, frequency, and urgency though he has been undergoing radiation treatment for prostate cancer.  He notes some minimal left flank pain that has improved.  He notes no fevers.  He generally feels better.  The patient CT scan revealed moderate left hydroureteronephrosis with extensive perinephric stranding.  He had right hydronephrosis with a chronic UPJ configuration.  Prostate cancer: Patient has undergone radiation therapy.  He was treated with leuprolide and notes he lost stamina as well as strength and interest in things.  He finished radiation last week.  Diabetes: Patient notes his sugars been close to 150 fasting.  He had a glucose of 232 prior to bedtime the other night.  He was started on Lantus 25 units daily in the hospital.  They discontinued his Jardiance.  He has been taking Trulicity 1.5 mg weekly.  He has polyuria with his prostate cancer treatment.  He notes if he is late for a meal he ends up with some hypoglycemia though he does not check his sugar and typically tries to eat something as quickly as possible.  Social History   Tobacco Use  Smoking Status Former   Packs/day: 1.50   Years: 40.00   Total pack years: 60.00   Types: Cigarettes   Quit date: 09/14/2007   Years since quitting: 14.1  Smokeless Tobacco Never  Tobacco Comments   quit 2009    Current Outpatient Medications on File Prior to Visit  Medication Sig Dispense Refill   aspirin EC 325 MG EC tablet Take 1 tablet (325 mg total) by mouth daily. 30 tablet 0   atorvastatin (LIPITOR) 80  MG tablet Take 1 tablet (80 mg total) by mouth daily. 90 tablet 3   blood glucose meter kit and supplies KIT Dispense based on patient and insurance preference. Use up to four times daily as directed. 1 each 0   carvedilol (COREG) 6.25 MG tablet Take 1 tablet (6.25 mg total) by mouth 2 (two) times daily with a meal. 180 tablet 3   diphenhydramine-acetaminophen (TYLENOL PM) 25-500 MG TABS tablet Take 2 tablets by mouth at bedtime.     ezetimibe (ZETIA) 10 MG tablet Take 1 tablet (10 mg total) by mouth daily. 90 tablet 3   furosemide (LASIX) 40 MG tablet Take 1 tablet (40 mg total) by mouth daily. 90 tablet 3   insulin glargine (LANTUS) 100 UNIT/ML Solostar Pen Inject 25 Units into the skin daily. 15 mL 0   Insulin Pen Needle 31G X 5 MM MISC Inject insulin via insulin pen daily 50 each 0   isosorbide mononitrate (IMDUR) 30 MG 24 hr tablet Take 1 tablet (30 mg total) by mouth daily. 90 tablet 3   metFORMIN (GLUCOPHAGE) 1000 MG tablet TAKE 1 TABLET TWICE A DAY WITH MEALS 180 tablet 3   Multiple Vitamins-Minerals (MULTIVITAMINS THER. W/MINERALS) TABS Take 1 tablet by mouth every morning.     niacin (NIASPAN) 1000 MG CR tablet TAKE 2 TABLETS DAILY AT BEDTIME 180 tablet 0   oxyCODONE-acetaminophen (PERCOCET) 5-325 MG tablet Take 1 tablet by mouth every 8 (eight) hours as needed for severe pain. 10 tablet  0   potassium chloride SA (KLOR-CON) 20 MEQ tablet Take 1 tablet (20 mEq total) by mouth daily. 90 tablet 3   tamsulosin (FLOMAX) 0.4 MG CAPS capsule Take 1 capsule (0.4 mg total) by mouth daily after supper. 90 capsule 0   losartan (COZAAR) 25 MG tablet Take 1 tablet (25 mg total) by mouth daily. 90 tablet 3   No current facility-administered medications on file prior to visit.     ROS see history of present illness  Objective  Physical Exam Vitals:   11/11/21 1056  BP: 118/70  Pulse: 90  Temp: 98.1 F (36.7 C)  SpO2: 95%    BP Readings from Last 3 Encounters:  11/11/21 118/70   10/25/21 118/80  10/02/21 100/60   Wt Readings from Last 3 Encounters:  11/11/21 190 lb 12.8 oz (86.5 kg)  10/21/21 180 lb (81.6 kg)  10/02/21 181 lb (82.1 kg)    Physical Exam Constitutional:      General: He is not in acute distress.    Appearance: He is not diaphoretic.  Cardiovascular:     Rate and Rhythm: Normal rate and regular rhythm.     Heart sounds: Normal heart sounds.  Pulmonary:     Effort: Pulmonary effort is normal.     Breath sounds: Normal breath sounds.  Abdominal:     Tenderness: There is no right CVA tenderness.     Comments: Minimal discomfort on left CVA exam  Skin:    General: Skin is warm and dry.  Neurological:     Mental Status: He is alert.      Assessment/Plan: Please see individual problem list.  Problem List Items Addressed This Visit     OSA (obstructive sleep apnea) (Chronic)    Patient has requested a new CPAP.  He needs an updated sleep study.  Order placed.      Relevant Orders   Home sleep test   Prostate cancer ([place]) (Chronic)    I suspect his continued dysuria, frequency, and urgency are related to his radiation treatment.  We are going to check a urinalysis today.  He will continue to follow with his urologist.      Uncontrolled type 2 diabetes mellitus with hyperglycemia, without long-term current use of insulin ([place]) - Primary (Chronic)    Remains uncontrolled.  We will increase his Trulicity to 3 mg weekly.  He will continue Lantus 25 units daily.  He will monitor for increasing hypoglycemia and if that occurs he will let us know.  Monitor for nausea with the increased dose of Trulicity.      Relevant Medications   Dulaglutide (TRULICITY) 3 IT/2.5QD SOPN   Acute pyelonephritis    Generally improved.  We will recheck a urinalysis today.      Relevant Orders   POCT Urinalysis Dipstick (Completed)   Urine Culture   Urine Microscopic   Anemia    Mild noted on labs.  Potentially could be related to recent illness or  radiation treatment or some other cause.  Plan to recheck in a few weeks with iron studies.      Relevant Orders   CBC   IBC + Ferritin     Return in about 3 weeks (around 12/02/2021) for Labs, follow-up with PCP as scheduled in August.   [name], MD [place]

## 2021-11-11 NOTE — Assessment & Plan Note (Signed)
Generally improved.  We will recheck a urinalysis today.

## 2021-11-14 ENCOUNTER — Other Ambulatory Visit: Payer: Self-pay | Admitting: Family Medicine

## 2021-11-14 ENCOUNTER — Ambulatory Visit (INDEPENDENT_AMBULATORY_CARE_PROVIDER_SITE_OTHER): Payer: Medicare Other | Admitting: Dermatology

## 2021-11-14 DIAGNOSIS — L578 Other skin changes due to chronic exposure to nonionizing radiation: Secondary | ICD-10-CM

## 2021-11-14 DIAGNOSIS — Z1283 Encounter for screening for malignant neoplasm of skin: Secondary | ICD-10-CM | POA: Diagnosis not present

## 2021-11-14 DIAGNOSIS — D229 Melanocytic nevi, unspecified: Secondary | ICD-10-CM | POA: Diagnosis not present

## 2021-11-14 DIAGNOSIS — L570 Actinic keratosis: Secondary | ICD-10-CM | POA: Diagnosis not present

## 2021-11-14 DIAGNOSIS — L814 Other melanin hyperpigmentation: Secondary | ICD-10-CM

## 2021-11-14 DIAGNOSIS — L821 Other seborrheic keratosis: Secondary | ICD-10-CM

## 2021-11-14 DIAGNOSIS — Z85828 Personal history of other malignant neoplasm of skin: Secondary | ICD-10-CM

## 2021-11-14 DIAGNOSIS — D1800 Hemangioma unspecified site: Secondary | ICD-10-CM | POA: Diagnosis not present

## 2021-11-14 LAB — URINE CULTURE
MICRO NUMBER:: 13601341
SPECIMEN QUALITY:: ADEQUATE

## 2021-11-14 MED ORDER — SULFAMETHOXAZOLE-TRIMETHOPRIM 800-160 MG PO TABS: 1.0000 | ORAL_TABLET | Freq: Two times a day (BID) | ORAL | 0 refills | Status: DC

## 2021-11-14 NOTE — Progress Notes (Signed)
Follow-Up Visit   Subjective  [name] is a 71 y.o. male who presents for the following: FBSE (The patient presents for Total-Body Skin Exam (TBSE) for skin cancer screening and mole check.  The patient has spots, moles and lesions to be evaluated, some may be new or changing and the patient has concerns that these could be cancer. Patient with hx of BCC and AK's.).   The following portions of the chart were reviewed this encounter and updated as appropriate:   Tobacco  Allergies  Meds  Problems  Med Hx  Surg Hx  Fam Hx      Review of Systems:  No other skin or systemic complaints except as noted in HPI or Assessment and Plan.  Objective  Well appearing patient in no apparent distress; mood and affect are within normal limits.  A full examination was performed including scalp, head, eyes, ears, nose, lips, neck, chest, axillae, abdomen, back, buttocks, bilateral upper extremities, bilateral lower extremities, hands, feet, fingers, toes, fingernails, and toenails. All findings within normal limits unless otherwise noted below.  right scalp x 6, left brow x 2 (8) Erythematous thin papules/macules with gritty scale.     Assessment & Plan  AK (actinic keratosis) (8) right scalp x 6, left brow x 2  Hypertrophic at right scalp. Recheck on follow up. Will bx if indicated.   Actinic keratoses are precancerous spots that appear secondary to cumulative UV radiation exposure/sun exposure over time. They are chronic with expected duration over 1 year. A portion of actinic keratoses will progress to squamous cell carcinoma of the skin. It is not possible to reliably predict which spots will progress to skin cancer and so treatment is recommended to prevent development of skin cancer.  Recommend daily broad spectrum sunscreen SPF 30+ to sun-exposed areas, reapply every 2 hours as needed.  Recommend staying in the shade or wearing long sleeves, sun glasses (UVA+UVB protection)  and wide brim hats (4-inch brim around the entire circumference of the hat). Call for new or changing lesions.  Prior to procedure, discussed risks of blister formation, small wound, skin dyspigmentation, or rare scar following cryotherapy. Recommend Vaseline ointment to treated areas while healing.   Destruction of lesion - right scalp x 6, left brow x 2  Destruction method: cryotherapy   Informed consent: discussed and consent obtained   Lesion destroyed using liquid nitrogen: Yes   Cryotherapy cycles:  2 Outcome: patient tolerated procedure well with no complications   Post-procedure details: wound care instructions given     Lentigines - Scattered tan macules - Due to sun exposure - Benign-appearing, observe - Recommend daily broad spectrum sunscreen SPF 30+ to sun-exposed areas, reapply every 2 hours as needed. - Call for any changes  Seborrheic Keratoses - Stuck-on, waxy, tan-brown papules and/or plaques  - Benign-appearing - Discussed benign etiology and prognosis. - Observe - Call for any changes  Melanocytic Nevi - Tan-brown and/or pink-flesh-colored symmetric macules and papules - Benign appearing on exam today - Observation - Call clinic for new or changing moles - Recommend daily use of broad spectrum spf 30+ sunscreen to sun-exposed areas.   Hemangiomas - Red papules - Discussed benign nature - Observe - Call for any changes  Actinic Damage - Chronic condition, secondary to cumulative UV/sun exposure - diffuse scaly erythematous macules with underlying dyspigmentation - Recommend daily broad spectrum sunscreen SPF 30+ to sun-exposed areas, reapply every 2 hours as needed.  - Staying in the shade or wearing  long sleeves, sun glasses (UVA+UVB protection) and wide brim hats (4-inch brim around the entire circumference of the hat) are also recommended for sun protection.  - Call for new or changing lesions.  Skin cancer screening performed today.  History  of Basal Cell Carcinoma of the Skin - No evidence of recurrence today - Recommend regular full body skin exams - Recommend daily broad spectrum sunscreen SPF 30+ to sun-exposed areas, reapply every 2 hours as needed.  - Call if any new or changing lesions are noted between office visits  Return in about 3 months (around 02/14/2022) for AK follow up.  Graciella Belton, RMA, am acting as scribe for Forest Gleason, MD .  Documentation: I have reviewed the above documentation for accuracy and completeness, and I agree with the above.  Forest Gleason, MD

## 2021-11-14 NOTE — Patient Instructions (Addendum)
Cryotherapy Aftercare  Wash gently with soap and water everyday.   Apply Vaseline and Band-Aid daily until healed.   Recommend taking Heliocare sun protection supplement daily in sunny weather for additional sun protection. For maximum protection on the sunniest days, you can take up to 2 capsules of regular Heliocare OR take 1 capsule of Heliocare Ultra. For prolonged exposure (such as a full day in the sun), you can repeat your dose of the supplement 4 hours after your first dose. Heliocare can be purchased at [facility], at some [facility] or at www.[email or web address].    Melanoma ABCDEs  Melanoma is the most dangerous type of skin cancer, and is the leading cause of death from skin disease.  You are more likely to develop melanoma if you: Have light-colored skin, light-colored eyes, or red or blond hair Spend a lot of time in the sun Tan regularly, either outdoors or in a tanning bed Have had blistering sunburns, especially during childhood Have a close family member who has had a melanoma Have atypical moles or large birthmarks  Early detection of melanoma is key since treatment is typically straightforward and cure rates are extremely high if we catch it early.   The first sign of melanoma is often a change in a mole or a new dark spot.  The ABCDE system is a way of remembering the signs of melanoma.  A for asymmetry:  The two halves do not match. B for border:  The edges of the growth are irregular. C for color:  A mixture of colors are present instead of an even brown color. D for diameter:  Melanomas are usually (but not always) greater than 6mm - the size of a pencil eraser. E for evolution:  The spot keeps changing in size, shape, and color.  Please check your skin once per month between visits. You can use a small mirror in front and a large mirror behind you to keep an eye on the back side or your body.   If you see any new or changing lesions before your next follow-up,  please call to schedule a visit.  Please continue daily skin protection including broad spectrum sunscreen SPF 30+ to sun-exposed areas, reapplying every 2 hours as needed when you're outdoors.    Due to recent changes in healthcare laws, you may see results of your pathology and/or laboratory studies on MyChart before the doctors have had a chance to review them. We understand that in some cases there may be results that are confusing or concerning to you. Please understand that not all results are received at the same time and often the doctors may need to interpret multiple results in order to provide you with the best plan of care or course of treatment. Therefore, we ask that you please give us 2 business days to thoroughly review all your results before contacting the office for clarification. Should we see a critical lab result, you will be contacted sooner.   If You Need Anything After Your Visit  If you have any questions or concerns for your doctor, please call our main line at [phone] and press option 4 to reach your doctor's medical assistant. If no one answers, please leave a voicemail as directed and we will return your call as soon as possible. Messages left after 4 pm will be answered the following business day.   You may also send us a message via MyChart. We typically respond to MyChart messages within 1-2 business days.    For prescription refills, please ask your pharmacy to contact our office. Our fax number is [phone].  If you have an urgent issue when the clinic is closed that cannot wait until the next business day, you can page your doctor at the number below.    Please note that while we do our best to be available for urgent issues outside of office hours, we are not available 24/7.   If you have an urgent issue and are unable to reach us, you may choose to seek medical care at your doctor's office, retail clinic, urgent care center, or emergency room.  If you  have a medical emergency, please immediately call 911 or go to the emergency department.  Pager Numbers  - Dr. Kowalski: [phone]  - Dr. Moye: [phone]  - Dr. Stewart: [phone]  In the event of inclement weather, please call our main line at [phone] for an update on the status of any delays or closures.  Dermatology Medication Tips: Please keep the boxes that topical medications come in in order to help keep track of the instructions about where and how to use these. Pharmacies typically print the medication instructions only on the boxes and not directly on the medication tubes.   If your medication is too expensive, please contact our office at [phone] option 4 or send us a message through MyChart.   We are unable to tell what your co-pay for medications will be in advance as this is different depending on your insurance coverage. However, we may be able to find a substitute medication at lower cost or fill out paperwork to get insurance to cover a needed medication.   If a prior authorization is required to get your medication covered by your insurance company, please allow us 1-2 business days to complete this process.  Drug prices often vary depending on where the prescription is filled and some pharmacies may offer cheaper prices.  The website www.goodrx.com contains coupons for medications through different pharmacies. The prices here do not account for what the cost may be with help from insurance (it may be cheaper with your insurance), but the website can give you the price if you did not use any insurance.  - You can print the associated coupon and take it with your prescription to the pharmacy.  - You may also stop by our office during regular business hours and pick up a GoodRx coupon card.  - If you need your prescription sent electronically to a different pharmacy, notify our office through [facility] MyChart or by phone at [phone] option  4.     Si Usted Necesita Algo Despus de Su Visita  Tambin puede enviarnos un mensaje a travs de MyChart. Por lo general respondemos a los mensajes de MyChart en el transcurso de 1 a 2 das hbiles.  Para renovar recetas, por favor pida a su farmacia que se ponga en contacto con nuestra oficina. Nuestro nmero de fax es el [phone].  Si tiene un asunto urgente cuando la clnica est cerrada y que no puede esperar hasta el siguiente da hbil, puede llamar/localizar a su doctor(a) al nmero que aparece a continuacin.   Por favor, tenga en cuenta que aunque hacemos todo lo posible para estar disponibles para asuntos urgentes fuera del horario de oficina, no estamos disponibles las 24 horas del da, los 7 das de la semana.   Si tiene un problema urgente y no puede comunicarse con nosotros, puede optar por buscar atencin mdica  en   el consultorio de su doctor(a), en una clnica privada, en un centro de atencin urgente o en una sala de emergencias.  Si tiene una emergencia mdica, por favor llame inmediatamente al 911 o vaya a la sala de emergencias.  Nmeros de bper  - Dr. Kowalski: [phone]  - Dra. Moye: [phone]  - Dra. Stewart: [phone]  En caso de inclemencias del tiempo, por favor llame a nuestra lnea principal al [phone] para una actualizacin sobre el estado de cualquier retraso o cierre.  Consejos para la medicacin en dermatologa: Por favor, guarde las cajas en las que vienen los medicamentos de uso tpico para ayudarle a seguir las instrucciones sobre dnde y cmo usarlos. Las farmacias generalmente imprimen las instrucciones del medicamento slo en las cajas y no directamente en los tubos del medicamento.   Si su medicamento es muy caro, por favor, pngase en contacto con nuestra oficina llamando al [phone] y presione la opcin 4 o envenos un mensaje a travs de MyChart.   No podemos decirle cul ser su copago por los medicamentos por  adelantado ya que esto es diferente dependiendo de la cobertura de su seguro. Sin embargo, es posible que podamos encontrar un medicamento sustituto a menor costo o llenar un formulario para que el seguro cubra el medicamento que se considera necesario.   Si se requiere una autorizacin previa para que su compaa de seguros cubra su medicamento, por favor permtanos de 1 a 2 das hbiles para completar este proceso.  Los precios de los medicamentos varan con frecuencia dependiendo del lugar de dnde se surte la receta y alguna farmacias pueden ofrecer precios ms baratos.  El sitio web www.goodrx.com tiene cupones para medicamentos de diferentes farmacias. Los precios aqu no tienen en cuenta lo que podra costar con la ayuda del seguro (puede ser ms barato con su seguro), pero el sitio web puede darle el precio si no utiliz ningn seguro.  - Puede imprimir el cupn correspondiente y llevarlo con su receta a la farmacia.  - Tambin puede pasar por nuestra oficina durante el horario de atencin regular y recoger una tarjeta de cupones de GoodRx.  - Si necesita que su receta se enve electrnicamente a una farmacia diferente, informe a nuestra oficina a travs de MyChart de [place] o por telfono llamando al [phone] y presione la opcin 4.  

## 2021-11-21 ENCOUNTER — Encounter: Payer: Self-pay | Admitting: Dermatology

## 2021-11-21 IMAGING — CR DG CHEST 2V
2 series · 2 of 2 positions shown · non-contrast
Comparison: None.

CLINICAL DATA: S/P [id number]/p cabg x 5

EXAM:
CHEST - 2 VIEW

[w chest pa]
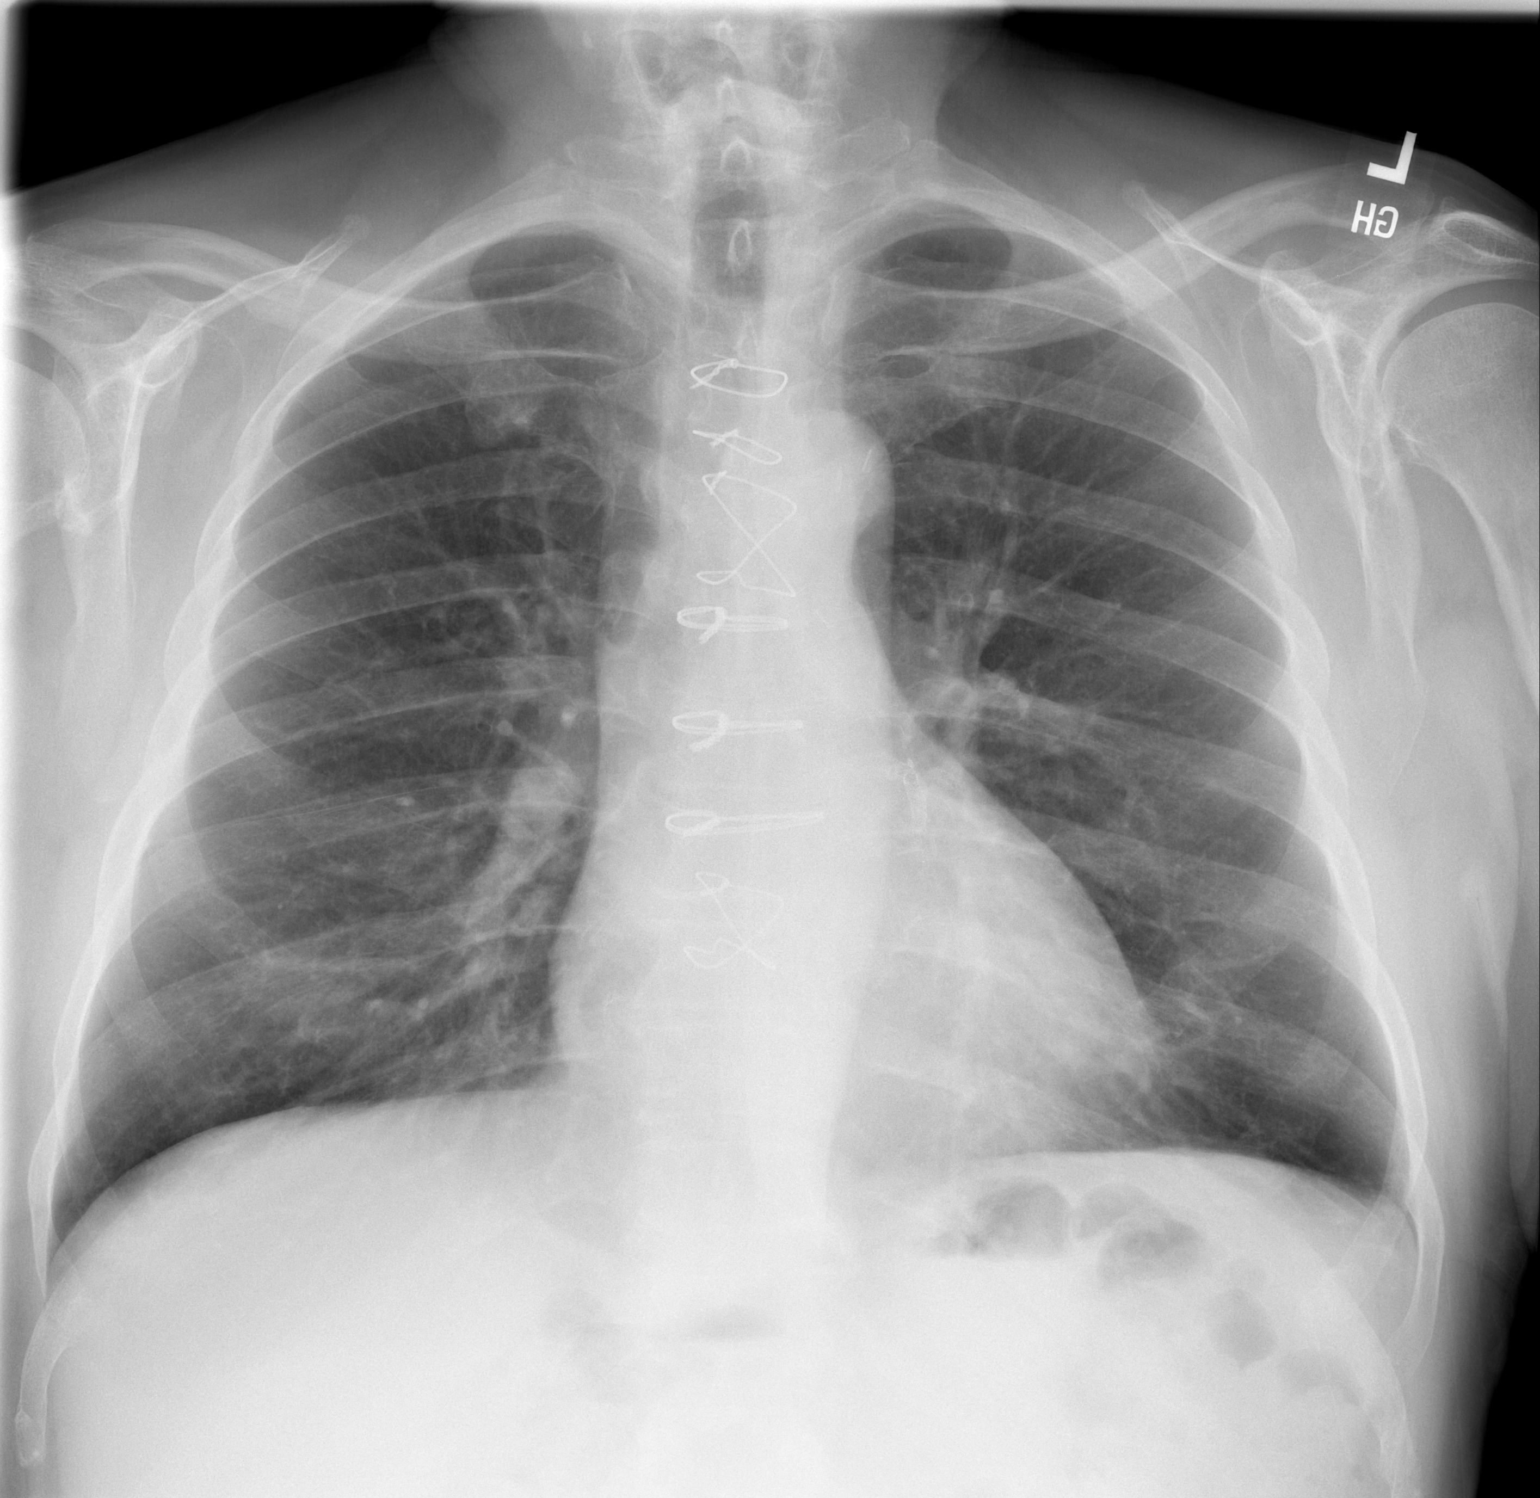

[w chest lat]
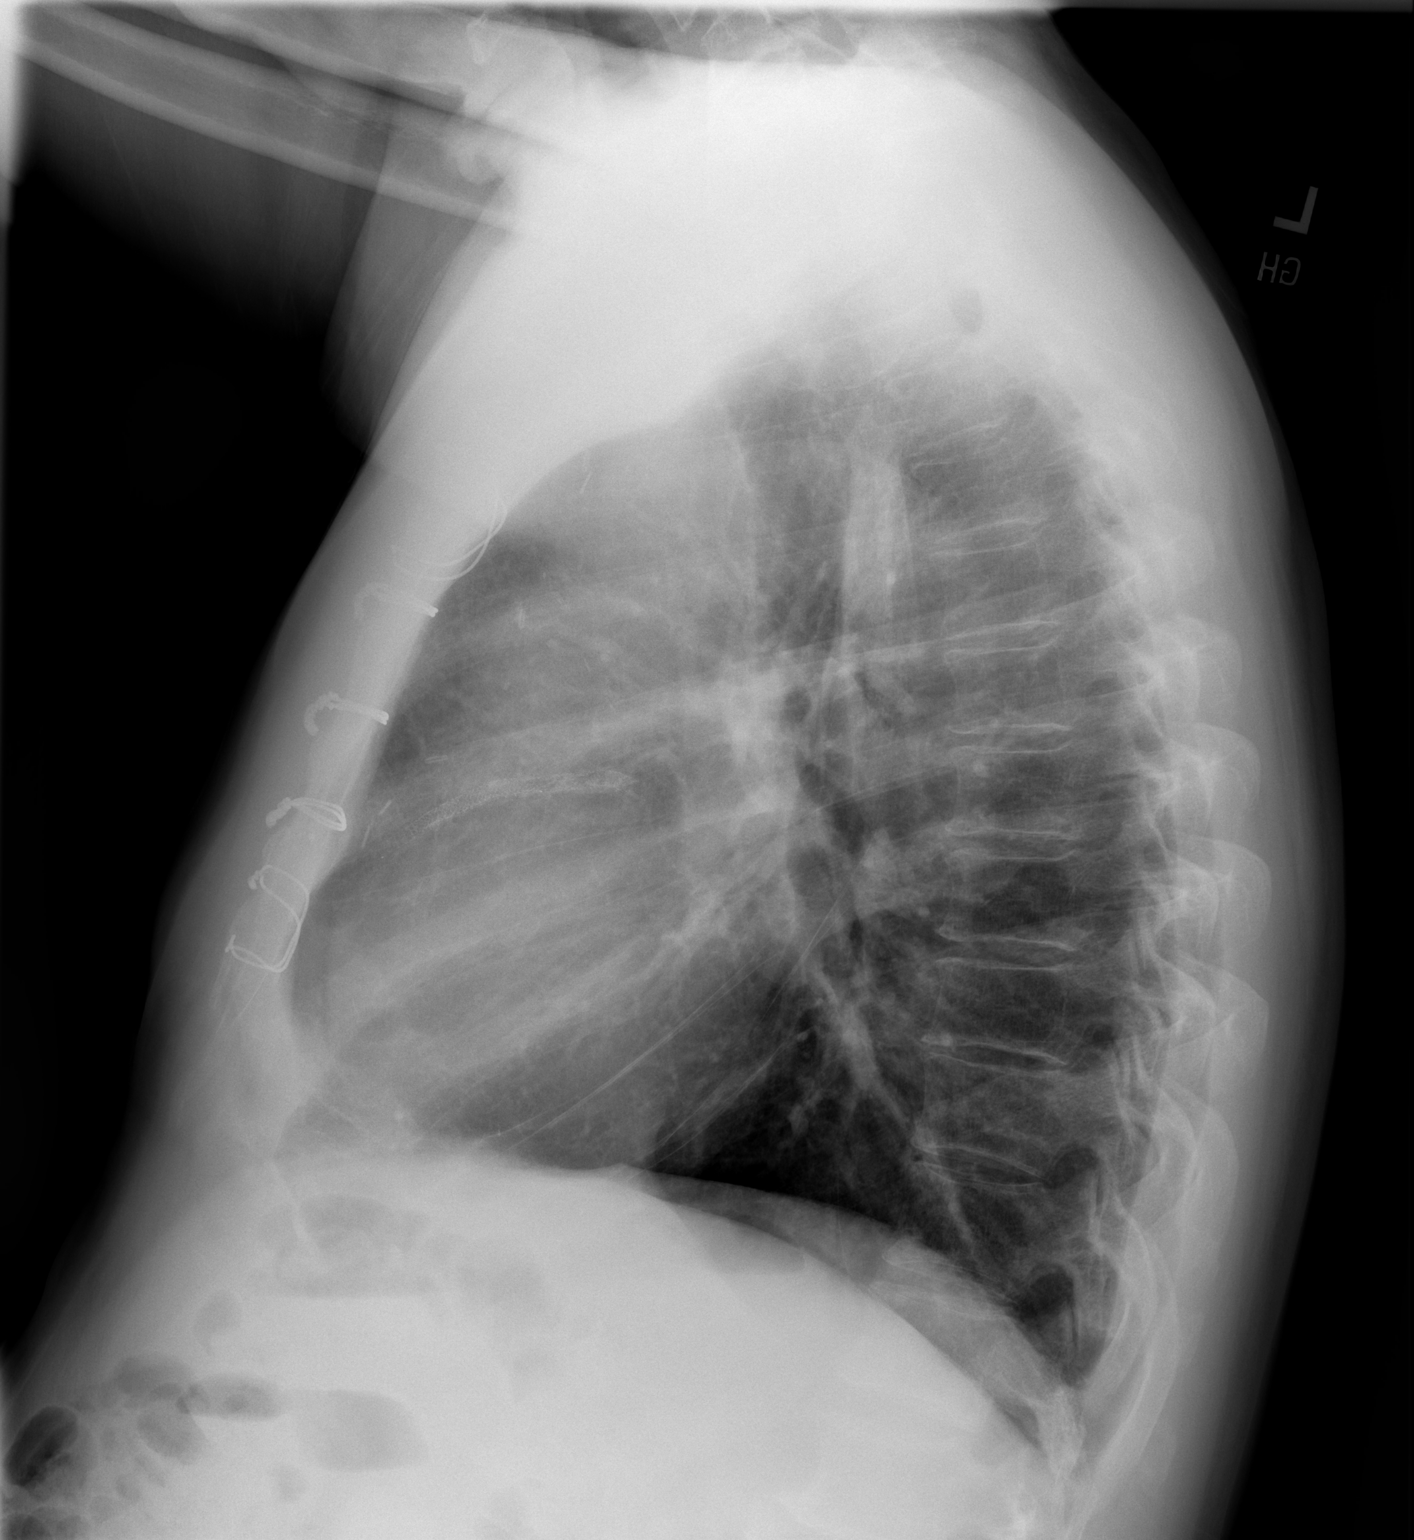

[2 of 2 positions shown; findings below may reference images not displayed]

FINDINGS: Sternotomy wires overlie normal cardiac silhouette. Normal pulmonary
vasculature. No effusion, infiltrate, or pneumothorax. No acute
osseous abnormality.
IMPRESSION: No acute cardiopulmonary process.

## 2021-12-02 ENCOUNTER — Other Ambulatory Visit: Payer: Self-pay | Admitting: Family Medicine

## 2021-12-02 ENCOUNTER — Other Ambulatory Visit (INDEPENDENT_AMBULATORY_CARE_PROVIDER_SITE_OTHER): Payer: Medicare Other

## 2021-12-02 DIAGNOSIS — D649 Anemia, unspecified: Secondary | ICD-10-CM

## 2021-12-02 DIAGNOSIS — N3000 Acute cystitis without hematuria: Secondary | ICD-10-CM

## 2021-12-02 DIAGNOSIS — R8290 Unspecified abnormal findings in urine: Secondary | ICD-10-CM | POA: Diagnosis not present

## 2021-12-02 LAB — CBC
HCT: 35.8 % — ABNORMAL LOW (ref 39.0–52.0)
Hemoglobin: 12.4 g/dL — ABNORMAL LOW (ref 13.0–17.0)
MCHC: 34.6 g/dL (ref 30.0–36.0)
MCV: 93.5 fl (ref 78.0–100.0)
Platelets: 140 10*3/uL — ABNORMAL LOW (ref 150.0–400.0)
RBC: 3.83 Mil/uL — ABNORMAL LOW (ref 4.22–5.81)
RDW: 15.1 % (ref 11.5–15.5)
WBC: 4.6 10*3/uL (ref 4.0–10.5)

## 2021-12-02 LAB — URINALYSIS, MICROSCOPIC ONLY

## 2021-12-02 LAB — POCT URINALYSIS DIPSTICK
Bilirubin, UA: NEGATIVE
Glucose, UA: POSITIVE — AB
Ketones, UA: NEGATIVE
Nitrite, UA: NEGATIVE
Protein, UA: NEGATIVE
Spec Grav, UA: 1.01 (ref 1.010–1.025)
Urobilinogen, UA: 0.2 E.U./dL
pH, UA: 5 (ref 5.0–8.0)

## 2021-12-02 LAB — IBC + FERRITIN
Ferritin: 61.8 ng/mL (ref 22.0–322.0)
Iron: 60 ug/dL (ref 42–165)
Saturation Ratios: 19.5 % — ABNORMAL LOW (ref 20.0–50.0)
TIBC: 308 ug/dL (ref 250.0–450.0)
Transferrin: 220 mg/dL (ref 212.0–360.0)

## 2021-12-03 ENCOUNTER — Telehealth: Payer: Self-pay | Admitting: Family Medicine

## 2021-12-03 NOTE — Telephone Encounter (Signed)
Pt called wanting to get clarification for his lab results. Pt wanted to know if he has a UTI or not

## 2021-12-03 NOTE — Telephone Encounter (Signed)
I called the patient and informed him that we are waiting on the urine culture, patient stated he is burning when he urinates and wants to know, I informed him I would call him when the culture results.  Delorice Bannister,cma

## 2021-12-04 LAB — URINE CULTURE
MICRO NUMBER:: 13685750
SPECIMEN QUALITY:: ADEQUATE

## 2021-12-05 NOTE — Telephone Encounter (Signed)
Message sent to Dr Erlene Quan. Awaiting message back. If the patient develops any fevers or flank pain while I await her response he needs to contact us immediately.

## 2021-12-09 ENCOUNTER — Encounter: Payer: Self-pay | Admitting: Radiation Oncology

## 2021-12-09 ENCOUNTER — Ambulatory Visit
Admission: RE | Admit: 2021-12-09 | Discharge: 2021-12-09 | Disposition: A | Discharge status: Home / Self Care | Payer: Medicare Other | Source: Ambulatory Visit | Attending: Radiation Oncology | Admitting: Radiation Oncology

## 2021-12-09 ENCOUNTER — Telehealth: Payer: Self-pay | Admitting: Family Medicine

## 2021-12-09 VITALS — BP 117/70 | HR 92 | Temp 98.5°F | Wt 197.0 lb

## 2021-12-09 DIAGNOSIS — R197 Diarrhea, unspecified: Secondary | ICD-10-CM | POA: Diagnosis not present

## 2021-12-09 DIAGNOSIS — N390 Urinary tract infection, site not specified: Secondary | ICD-10-CM | POA: Diagnosis not present

## 2021-12-09 DIAGNOSIS — K5900 Constipation, unspecified: Secondary | ICD-10-CM | POA: Insufficient documentation

## 2021-12-09 DIAGNOSIS — Z923 Personal history of irradiation: Secondary | ICD-10-CM | POA: Insufficient documentation

## 2021-12-09 DIAGNOSIS — C61 Malignant neoplasm of prostate: Secondary | ICD-10-CM | POA: Insufficient documentation

## 2021-12-09 NOTE — Progress Notes (Signed)
Radiation Oncology Follow up Note  Name: [name]   Date:   12/09/2021 MRN:  [id number] DOB: 26-Apr-1951    This 71 y.o. 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  REFERRING PROVIDER: [name], MD  HPI: Patient is a 71 year old male now at 1 month having completed IMRT radiation therapy to his prostate for Gleason 7 adenocarcinoma.  Seen today in routine follow-up still has some dysuria major complaint is burning on urination.  He states he is drinking cranberry juice.  He also oscillates between diarrhea and constipation.  Does not follow any real low residue diet or take Imodium.Marland Kitchen  He has seen his PMD and a urinalysis shows glucosuria bacteria and that has been referred back to his urologist.  Urine also showed E. coli.  For possible antibiotic treatment.  COMPLICATIONS OF TREATMENT: none  FOLLOW UP COMPLIANCE: keeps appointments   PHYSICAL EXAM:  BP 117/70 (BP Location: Right Arm, Patient Position: Sitting, Cuff Size: Normal)   Pulse 92   Temp 98.5 F (36.9 C) (Tympanic)   Wt 197 lb (89.4 kg)   BMI 31.80 kg/m  Well-developed well-nourished patient in NAD. HEENT reveals PERLA, EOMI, discs not visualized.  Oral cavity is clear. No oral mucosal lesions are identified. Neck is clear without evidence of cervical or supraclavicular adenopathy. Lungs are clear to A&P. Cardiac examination is essentially unremarkable with regular rate and rhythm without murmur rub or thrill. Abdomen is benign with no organomegaly or masses noted. Motor sensory and DTR levels are equal and symmetric in the upper and lower extremities. Cranial nerves II through XII are grossly intact. Proprioception is intact. No peripheral adenopathy or edema is identified. No motor or sensory levels are noted. Crude visual fields are within normal  range.  RADIOLOGY RESULTS: No current films for review  PLAN: Present time patient is has a UTI.  He is seen urology for treatment.  Hopefully this will help some of his urinary tract symptoms.  I have asked to see him back in 3 months with a PSA at that time.  Patient knows to call with any concerns.  I would like to take this opportunity to thank you for allowing me to participate in the care of your patient.Noreene Filbert, MD

## 2021-12-09 NOTE — Telephone Encounter (Signed)
-----   Message from Hollice Espy, MD sent at 12/09/2021  2:22 PM EDT ----- Regarding: RE: question I think this is super complicated and he probably just needs to see [place] in clinic.  We need to be sure that he is emptying his bladder and not retaining.  Looks like he also has some hydronephrosis and we need to make sure that resolves as well.  I will CC my office staff to get him more prompt follow-up with myself or our PAs Sam or Larene Beach.   Hollice Espy, MD  ----- Message ----- From: [name], MD Sent: 12/05/2021   4:03 PM EDT To: Hollice Espy, MD Subject: question                                       Hi Dr Darnelle Going seen Mr Smeltz for hospital follow-up this month. He was hospitalized for pyelonephritis and e coli bacteremia. He was much improved when I followed up with him in early July, though he did note some residual urinary frequency, urgency, and dysuria though no systemic symptoms. I checked his urine at that time and it grew e coli 10,000-49,000 CFU. Given his persistent symptoms and prior issues with pyelonephritis/bacteremia I ended up treating him again with keflex based on his urine sensitivities. Earlier this week he followed up for lab work and noted some persistent urinary symptoms so we checked his urine again and it grew e coli 10,000-49,000 CFU. 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 At this point he notes the dysuria is the most bothersome symptom. Thanks for your help.   Randall Hiss

## 2021-12-09 NOTE — Telephone Encounter (Signed)
I called and spoke with the patient and informed him that the urologist will be seeing him sooner than his initial appointment and he understood, I also informed him that if he did not here from them in the next couple days to be scheduled to let us know and he understood.  Jeannia Tatro,cma

## 2021-12-09 NOTE — Telephone Encounter (Signed)
Please let the patient know that I heard back from his urologist.  They are going to reach out to him to get him scheduled for follow-up sooner than he is already scheduled given what is been going on with his urine.  If he does not hear from them in the next couple of days he needs to let us know.

## 2021-12-11 NOTE — Progress Notes (Unsigned)
12/12/2021 4:15 PM   [name] 01/12/1951 [id number]  Referring provider: [name], MD [address] [place],  [place] [address]  Urological history: 1. Prostate cancer -PSA, 06/2021 - 7.37 -unfavorable intermediate risk -gold seed placement 08/2021 -ADT x 6 months  2. Right UPJ obstruction  -Renal Lasix scan, 2018 - Low-grade obstruction at the right ureteral pelvic junction with partial washout of activity following Lasix.  Normal left renogram curve.  Differential renal function is approximately 51.5% left and 48.5% right. -non-contrast CT 10/2021 - Right hydronephrosis noted. Right ureter decompressed. 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 Nephrolithiasis -non-contrast CT 10/2021 - Bilateral nephrolithiasis.  Chief Complaint  Patient presents with   Hydronephrosis    HPI: [name] is a 71 y.o. male who presents today for hydronephrosis.  Non-contrast CT 10/2021 - Moderate left hydroureteronephrosis with extensive perinephric stranding. No visible obstructing stones.  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    He has been having issues with UTI's since June when he was hospitalized with sepsis.  His symptoms consist of frequency, urgency, dysuria, difficulty urinating, weak urinary stream, left flank pain, chills and night sweats.  He has completed radiation for prostate cancer and also ADT, so some of these symptoms may be a side effect of his prostate cancer treatment and not necessarily a UTI.  He is frustrated and concerned as he continues to have symptoms with positive urine cultures and he is fearful he may be admitted again with sepsis.   Urinalysis with 1+ glucose, 2+ blood, 1+ protein, nitrate positive, leukocyte +3, greater than 30 WBCs, greater than 30 RBCs and many bacteria   PVR 93 mL    PMH: Past Medical History:  Diagnosis Date   Actinic keratoses    Angina    Asthma     BCC (basal cell carcinoma of skin) 09/06/2020   left upper back lateral (EDC) ,  left upper arm anterior (EDC), left upper back medial (EDC)    Bursitis of left elbow 2018   Diabetes mellitus without complication ([place])    ED (erectile dysfunction)    HTN (hypertension) 04/12/2011   Hyperlipemia 04/12/2011   Ischemic cardiomyopathy    a. echo 2010: EF 45-50%, mild to mod ant and apical wall HK, trace MR; b. cardiac cath 06/2012: EF 40%, mild MR    Multiple vessel coronary artery disease 04/12/2011   a. remote MI 1996; b. MI 1997 s/p PCI - LAD & diag; c. MI 2009: long stenting P-MLAD & Diag & POBA of jailed diag branch; d. cath 2010: 80% ISR of LAD w/ L-R collats, med Rx; e. cath 2012: 50-60% tub mLAD, 60-70% dLAD, FFR 0.75 -->s/p PCI dissection => w/ 2 stents (3 total); f. cath 06/2012: no changes from 2012 cath, med Rx, patent stents -> MV CAD 11/20/20 --> CABG X 5   Myocardial infarction (HCC)    x 5   Obesity    OSA (obstructive sleep apnea)    on CPAP   Osteoarthritis    Renal disorder    kidney stone    Surgical History: Past Surgical History:  Procedure Laterality Date   CORONARY ANGIOPLASTY     x 5   CORONARY ARTERY BYPASS GRAFT N/A 11/20/2020   Procedure: CORONARY ARTERY BYPASS GRAFTING (CABG) x  FIVE (LIMA-dLAD, SVG-PDA, SVG-D1, SeqSVG-OM1-2) ON PUMP  USING LEFT INTERNAL MAMMARY ARTERTY AND LEFT ENDOSCOPIC GREATER SAPHENOUS VEIN CONDUITS, RIGHT LEG OPENED NOT HARVESTED;  Surgeon: Wonda Olds, MD;  Location: [facility];  Service: Open Heart Surgery;  Laterality: N/A;   CORONARY STENT PLACEMENT     x 7   INTRAVASCULAR PRESSURE WIRE/FFR STUDY N/A 11/15/2020   Procedure: INTRAVASCULAR PRESSURE WIRE/FFR STUDY;  Surgeon: Nelva Bush, MD;  Location: [facility] CV LAB;  Service: Cardiovascular;  Laterality: N/A;   LEFT HEART CATH AND CORONARY ANGIOGRAPHY N/A 11/15/2020   Procedure: LEFT HEART CATH AND CORONARY ANGIOGRAPHY;  Surgeon: Nelva Bush, MD;  Location: [facility];  Service: Cardiovascular;  Laterality: N/A;   LEFT HEART CATHETERIZATION WITH CORONARY ANGIOGRAM N/A 04/14/2011   Procedure: LEFT HEART CATHETERIZATION WITH CORONARY ANGIOGRAM;  Surgeon: Pixie Casino, MD;  [facility];  Service: Cardiovascular;  Laterality: N/A;   LEFT HEART CATHETERIZATION WITH CORONARY ANGIOGRAM N/A 06/25/2012   Procedure: LEFT HEART CATHETERIZATION WITH CORONARY ANGIOGRAM;  Surgeon: Peter M Martinique, MD;  Location: [facility] CATH LAB;  Service: Cardiovascular;  Laterality: N/A;   TEE WITHOUT CARDIOVERSION N/A 11/20/2020   Procedure: TRANSESOPHAGEAL ECHOCARDIOGRAM (TEE);  Surgeon: Wonda Olds, MD;  Location: [facility];  Service: Open Heart Surgery;  Laterality: N/A;   TONSILLECTOMY AND ADENOIDECTOMY  1955    Home Medications:  Allergies as of 12/12/2021       Reactions   Hydroxyzine Hcl Other (See Comments)   Weakness, out of this world feeling. sluggishness   Latex Rash   I.e. Elastic= rash to blisters if worn for an extended time Patient show effects after long exposure.        Medication List        Accurate as of December 12, 2021  4:15 PM. If you have any questions, ask your nurse or doctor.          aspirin EC 325 MG tablet Take 1 tablet (325 mg total) by mouth daily.   atorvastatin 80 MG tablet Commonly known as: LIPITOR Take 1 tablet (80 mg total) by mouth daily.   blood glucose meter kit and supplies Kit Dispense based on patient and insurance preference. Use up to four times daily as directed.   carvedilol 6.25 MG tablet Commonly known as: COREG Take 1 tablet (6.25 mg total) by mouth 2 (two) times daily with a meal.   diphenhydramine-acetaminophen 25-500 MG Tabs tablet Commonly known as: TYLENOL PM Take 2 tablets by mouth at bedtime.   ezetimibe 10 MG tablet Commonly known as: ZETIA Take 1 tablet (10 mg total) by mouth daily.   furosemide 40 MG tablet Commonly known as: LASIX Take 1 tablet (40 mg total) by mouth  daily.   insulin glargine 100 UNIT/ML Solostar Pen Commonly known as: LANTUS Inject 25 Units into the skin daily.   Insulin Pen Needle 31G X 5 MM Misc Inject insulin via insulin pen daily   isosorbide mononitrate 30 MG 24 hr tablet Commonly known as: IMDUR Take 1 tablet (30 mg total) by mouth daily.   losartan 25 MG tablet Commonly known as: COZAAR Take 1 tablet (25 mg total) by mouth daily.   metFORMIN 1000 MG tablet Commonly known as: GLUCOPHAGE TAKE 1 TABLET TWICE A DAY WITH MEALS   multivitamins ther. w/minerals Tabs tablet Take 1 tablet by mouth every morning.   niacin 1000 MG CR tablet Commonly known as: NIASPAN TAKE 2 TABLETS DAILY AT BEDTIME   potassium chloride SA 20 MEQ tablet Commonly known as: KLOR-CON M Take 1 tablet (20 mEq total) by mouth daily.   sulfamethoxazole-trimethoprim 800-160 MG tablet  Commonly known as: BACTRIM DS Take 1 tablet by mouth every 12 (twelve) hours. Started by: Zara Council, PA-C   tamsulosin 0.4 MG Caps capsule Commonly known as: FLOMAX TAKE 1 CAPSULE DAILY AFTER SUPPER What changed: See the new instructions. Changed by: Noreene Filbert, MD   Trulicity 3 YW/7.3XT Sopn Generic drug: Dulaglutide Inject 3 mg as directed once a week.        Allergies:  Allergies  Allergen Reactions   Hydroxyzine Hcl Other (See Comments)    Weakness, out of this world feeling. sluggishness   Latex Rash    I.e. Elastic= rash to blisters if worn for an extended time Patient show effects after long exposure.    Family History: Family History  Problem Relation Age of Onset   COPD Mother    Other Father        muscle tumor   Stroke Father    Prostate cancer Father        mets   Heart disease Father    Alcoholism Father    Stroke Sister    Heart block Sister        Heart bypass   Bladder Cancer Neg Hx    Kidney cancer Neg Hx     Social History:  reports that he quit smoking about 14 years ago. His smoking use included  cigarettes. He has a 60.00 pack-year smoking history. He has never used smokeless tobacco. He reports that he does not drink alcohol and does not use drugs.  ROS: Pertinent ROS in HPI  Physical Exam: BP 123/71   Pulse 88   Ht _0  (1.727 m)   Wt 194 lb (88 kg)   BMI 29.50 kg/m   Constitutional:  Well nourished. Alert and oriented, No acute distress. HEENT: [place] AT, moist mucus membranes.  Trachea midline Cardiovascular: No clubbing, cyanosis, or edema. Respiratory: Normal respiratory effort, no increased work of breathing. Neurologic: Grossly intact, no focal deficits, moving all 4 extremities. Psychiatric: Normal mood and affect.  Laboratory Data: Lab Results  Component Value Date   WBC 4.6 12/02/2021   HGB 12.4 (L) 12/02/2021   HCT 35.8 (L) 12/02/2021   MCV 93.5 12/02/2021   PLT 140.0 (L) 12/02/2021    Lab Results  Component Value Date   CREATININE 0.76 10/25/2021    Lab Results  Component Value Date   PSA 7.37 (H) 07/02/2021   PSA 1.24 09/01/2013    Lab Results  Component Value Date   HGBA1C 8.9 (A) 10/02/2021    Lab Results  Component Value Date   AST 21 10/21/2021   Lab Results  Component Value Date   ALT 24 10/21/2021    Urinalysis See epic and HPI  I have reviewed the labs.   Pertinent Imaging:  Most Recent  Scan Result 48m 12/12/21 15:20   Assessment & Plan:    1. Left hydronephrosis -may have been the result of the inflammation associated with pyelonephritis  -At this time, it may be difficult to separate his symptoms as some of them are similar to what one experiences after treatment of prostate cancer with radiation and ADT therapy.  I am concerned however, that he continues to experience left-sided flank pain and there was left-sided hydronephrosis on the CT scan completed in June.  At this time I will go ahead and order a CT urogram, to further evaluate his history of left hydronephrosis and further evaluation of his right UPJ  obstruction.   -As he is very concerned and  would like a resolution to his symptoms and infections, he will return next week to go over CT scan results with Dr. Erlene Quan  2. rUTI -UA grossly infected -Urine sent for culture -Patient given a gram of Rocephin here in the office -Prescription for Septra DS twice daily sent to pharmacy and he will start that tomorrow -CT is pending -Reviewed red flag signs and instructed to seek treatment in the ED or contact our office regarding the symptoms  3.  Microscopic hematuria -Likely secondary to urinary tract infections and/or pelvic radiation, but CT urogram is pending  4. Right UPJ obstruction -CT urogram pending  5. Prostate cancer -follow up in 02/2022  Return for keep appointment with Dr. Erlene Quan .  These notes generated with voice recognition software. I apologize for typographical errors.  Zara Council, PA-C  [facility] [address]  [place], [place] [address] [phone]

## 2021-12-12 ENCOUNTER — Ambulatory Visit (INDEPENDENT_AMBULATORY_CARE_PROVIDER_SITE_OTHER): Payer: Medicare Other | Admitting: Urology

## 2021-12-12 ENCOUNTER — Other Ambulatory Visit: Payer: Self-pay | Admitting: Radiation Oncology

## 2021-12-12 ENCOUNTER — Telehealth: Payer: Self-pay | Admitting: Orthopaedic Surgery

## 2021-12-12 ENCOUNTER — Encounter: Payer: Self-pay | Admitting: Urology

## 2021-12-12 VITALS — BP 123/71 | HR 88 | Ht 68.0 in | Wt 194.0 lb

## 2021-12-12 DIAGNOSIS — N390 Urinary tract infection, site not specified: Secondary | ICD-10-CM

## 2021-12-12 DIAGNOSIS — R3129 Other microscopic hematuria: Secondary | ICD-10-CM | POA: Diagnosis not present

## 2021-12-12 DIAGNOSIS — C61 Malignant neoplasm of prostate: Secondary | ICD-10-CM

## 2021-12-12 DIAGNOSIS — Z8546 Personal history of malignant neoplasm of prostate: Secondary | ICD-10-CM | POA: Diagnosis not present

## 2021-12-12 DIAGNOSIS — N1330 Unspecified hydronephrosis: Secondary | ICD-10-CM | POA: Diagnosis not present

## 2021-12-12 DIAGNOSIS — N135 Crossing vessel and stricture of ureter without hydronephrosis: Secondary | ICD-10-CM

## 2021-12-12 LAB — URINALYSIS, COMPLETE
Bilirubin, UA: NEGATIVE
Ketones, UA: NEGATIVE
Nitrite, UA: POSITIVE — AB
Specific Gravity, UA: 1.015 (ref 1.005–1.030)
Urobilinogen, Ur: 1 mg/dL (ref 0.2–1.0)
pH, UA: 5.5 (ref 5.0–7.5)

## 2021-12-12 LAB — BLADDER SCAN AMB NON-IMAGING

## 2021-12-12 LAB — MICROSCOPIC EXAMINATION
RBC, Urine: >30 /hpf — AB (ref 0–2)
WBC, UA: >30 /hpf — AB (ref 0–5)

## 2021-12-12 MED ORDER — CEFTRIAXONE SODIUM 1 G IJ SOLR
1.0000 g | Freq: Once | INTRAMUSCULAR | Status: AC
  Administered 2021-12-12: 1 g via INTRAMUSCULAR

## 2021-12-12 MED ORDER — SULFAMETHOXAZOLE-TRIMETHOPRIM 800-160 MG PO TABS: 1.0000 | ORAL_TABLET | Freq: Two times a day (BID) | ORAL | 0 refills | Status: DC

## 2021-12-12 NOTE — Progress Notes (Signed)
IM Injection  Patient is present today for an IM Injection for treatment of rUTI. Drug: Ceftriaxone Dose:1g Location: Upper outer left buttocks Lot: [id number] Exp:09/2023 Patient tolerated well, no complications were noted  Performed by: Bradly Bienenstock CMA

## 2021-12-12 NOTE — Telephone Encounter (Signed)
Called patient left message to return call to schedule an appointment with Dr. Ninfa Linden per his mychart message for lt hip pain

## 2021-12-12 NOTE — Addendum Note (Signed)
Addended by: Evelina Bucy on: 12/12/2021 04:29 PM   Modules accepted: Orders

## 2021-12-16 ENCOUNTER — Ambulatory Visit (INDEPENDENT_AMBULATORY_CARE_PROVIDER_SITE_OTHER): Payer: Medicare Other | Admitting: Physician Assistant

## 2021-12-16 ENCOUNTER — Ambulatory Visit (INDEPENDENT_AMBULATORY_CARE_PROVIDER_SITE_OTHER): Payer: Medicare Other

## 2021-12-16 ENCOUNTER — Encounter: Payer: Self-pay | Admitting: Physician Assistant

## 2021-12-16 DIAGNOSIS — M25511 Pain in right shoulder: Secondary | ICD-10-CM | POA: Diagnosis not present

## 2021-12-16 DIAGNOSIS — M7062 Trochanteric bursitis, left hip: Secondary | ICD-10-CM | POA: Diagnosis not present

## 2021-12-16 DIAGNOSIS — G8929 Other chronic pain: Secondary | ICD-10-CM | POA: Diagnosis not present

## 2021-12-16 LAB — CULTURE, URINE COMPREHENSIVE

## 2021-12-16 NOTE — Progress Notes (Signed)
Office Visit Note   Patient: [name]           Date of Birth: 01-18-1951           MRN: [id number] Visit Date: 12/16/2021              Requested by: [name], MD [address] [place],  [place] [address] PCP: [name], MD   Assessment & Plan: Visit Diagnoses:  1. Chronic right shoulder pain   2. Trochanteric bursitis, left hip     Plan: Patient is shown IT band stretching exercises which she will perform for both hips.  In regards to hip injection would not recommend with his current UTI and recent history of's admission with sepsis secondary to UTI.  He may benefit from a right shoulder AC joint injection in the future but again would hold off due to UTI/sepsis.  Follow-Up Instructions: Return if symptoms worsen or fail to improve.   Orders:  Orders Placed This Encounter  Procedures   XR Shoulder Right   No orders of the defined types were placed in this encounter.     Procedures: No procedures performed   Clinical Data: No additional findings.   Subjective: Chief Complaint  Patient presents with   Right Shoulder - Pain   Left Hip - Follow-up, Pain    HPI [name] comes in today due to left hip pain.  He is having previous left hip trach injections last 1 being on 06/03/2021.  States this was beneficial.  He had no injury to the hip.  And states that pain just started a few weeks ago.  No injury.  Right shoulder pain for 20 years getting worse.  Pain is worse with abduction right arm across the chest.  Denies any numbness tingling down the arm.  Does note some decreased range of motion in the arm secondary to pain. Patient is currently on Bactrim is having issues with UTI and was admitted with sepsis in June due to an UTI.  Review of Systems See HPI otherwise negative  Objective: Vital Signs: There were no vitals taken for this visit.  Physical Exam General: Well-developed well-nourished male no acute distress  ambulates without any assistive device. Ortho Exam Right shoulder: Negative impingement testing.  Positive crossover.  5 out of 5 strength bilateral shoulders with external and internal rotation against resistance.  Empty can test is negative bilaterally.  Passive range of motion both shoulders reveals no significant crepitus. Specialty Comments:  No specialty comments available.  Imaging: XR Shoulder Right  Result Date: 12/16/2021 Right shoulder 3 views: Shoulder is well located.  No acute findings.  Moderate to moderately severe arthritic changes AC joint.  Inferior periarticular spurring noted off the humeral head.  Otherwise the glenohumeral joint appears well-preserved.    PMFS History: Patient Active Problem List   Diagnosis Date Noted   Anemia 11/11/2021   Dysuria 10/02/2021   Left hip pain 07/02/2021   Hearing loss 07/02/2021   Foot cramps 03/20/2021   Anxiety 12/11/2020   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   Multiple vessel coronary artery disease 11/20/2020   Colon cancer screening 10/18/2019   Bone spicules of jaw 12/27/2018   Insomnia 02/18/2018   Adrenal adenoma 03/03/2017   Right shoulder pain 03/03/2017   OSA (obstructive sleep apnea) 07/30/2016   Low back pain 05/15/2016   Actinic keratoses 02/07/2016   Right hip pain 02/07/2016   Ischemic cardiomyopathy    Uncontrolled type  2 diabetes mellitus with hyperglycemia, without long-term current use of insulin ([place]) 11/30/2014   Blood in semen 09/01/2013   Sleep disturbance 08/08/2013   CAD s/p CABG (coronary artery disease) 04/12/2011   HTN (hypertension) 04/12/2011   Hyperlipemia 04/12/2011   Acute pyelonephritis 04/12/2011   Hypoxemia 04/12/2011   Prostate cancer ([place]) 04/12/2011   Past Medical History:  Diagnosis Date   Actinic keratoses    Angina    Asthma    BCC (basal cell carcinoma of skin) 09/06/2020   left upper back lateral (EDC) ,  left upper arm anterior (EDC), left upper back medial (EDC)    Bursitis of  left elbow 2018   Diabetes mellitus without complication ([place])    ED (erectile dysfunction)    HTN (hypertension) 04/12/2011   Hyperlipemia 04/12/2011   Ischemic cardiomyopathy    a. echo 2010: EF 45-50%, mild to mod ant and apical wall HK, trace MR; b. cardiac cath 06/2012: EF 40%, mild MR    Multiple vessel coronary artery disease 04/12/2011   a. remote MI 1996; b. MI 1997 s/p PCI - LAD & diag; c. MI 2009: long stenting P-MLAD & Diag & POBA of jailed diag branch; d. cath 2010: 80% ISR of LAD w/ L-R collats, med Rx; e. cath 2012: 50-60% tub mLAD, 60-70% dLAD, FFR 0.75 -->s/p PCI dissection => w/ 2 stents (3 total); f. cath 06/2012: no changes from 2012 cath, med Rx, patent stents -> MV CAD 11/20/20 --> CABG X 5   Myocardial infarction (HCC)    x 5   Obesity    OSA (obstructive sleep apnea)    on CPAP   Osteoarthritis    Renal disorder    kidney stone    Family History  Problem Relation Age of Onset   COPD Mother    Other Father        muscle tumor   Stroke Father    Prostate cancer Father        mets   Heart disease Father    Alcoholism Father    Stroke Sister    Heart block Sister        Heart bypass   Bladder Cancer Neg Hx    Kidney cancer Neg Hx     Past Surgical History:  Procedure Laterality Date   CORONARY ANGIOPLASTY     x 5   CORONARY ARTERY BYPASS GRAFT N/A 11/20/2020   Procedure: CORONARY ARTERY BYPASS GRAFTING (CABG) x  FIVE (LIMA-dLAD, SVG-PDA, SVG-D1, SeqSVG-OM1-2) ON PUMP  USING LEFT INTERNAL MAMMARY ARTERTY AND LEFT ENDOSCOPIC GREATER SAPHENOUS VEIN CONDUITS, RIGHT LEG OPENED NOT HARVESTED;  Surgeon: Wonda Olds, MD;  Location: [facility] OR;  Service: Open Heart Surgery;  Laterality: N/A;   CORONARY STENT PLACEMENT     x 7   INTRAVASCULAR PRESSURE WIRE/FFR STUDY N/A 11/15/2020   Procedure: INTRAVASCULAR PRESSURE WIRE/FFR STUDY;  Surgeon: Nelva Bush, MD;  Location: [facility] CV LAB;  Service: Cardiovascular;  Laterality: N/A;   LEFT HEART CATH AND  CORONARY ANGIOGRAPHY N/A 11/15/2020   Procedure: LEFT HEART CATH AND CORONARY ANGIOGRAPHY;  Surgeon: Nelva Bush, MD;  Location: [facility] CV LAB;  Service: Cardiovascular;  Laterality: N/A;   LEFT HEART CATHETERIZATION WITH CORONARY ANGIOGRAM N/A 04/14/2011   Procedure: LEFT HEART CATHETERIZATION WITH CORONARY ANGIOGRAM;  Surgeon: Pixie Casino, MD;  Location: [facility] CATH LAB;  Service: Cardiovascular;  Laterality: N/A;   LEFT HEART CATHETERIZATION WITH CORONARY ANGIOGRAM N/A 06/25/2012   Procedure: LEFT HEART CATHETERIZATION WITH  CORONARY ANGIOGRAM;  Surgeon: Peter M Martinique, MD;  Location: [facility] CATH LAB;  Service: Cardiovascular;  Laterality: N/A;   TEE WITHOUT CARDIOVERSION N/A 11/20/2020   Procedure: TRANSESOPHAGEAL ECHOCARDIOGRAM (TEE);  Surgeon: Wonda Olds, MD;  Location: [facility];  Service: Open Heart Surgery;  Laterality: N/A;   TONSILLECTOMY AND ADENOIDECTOMY  1955   Social History   Occupational History   Occupation: [employer]-retired   Occupation: Press photographer  Tobacco Use   Smoking status: Former    Packs/day: 1.50    Years: 40.00    Total pack years: 60.00    Types: Cigarettes    Quit date: 09/14/2007    Years since quitting: 14.2   Smokeless tobacco: Never   Tobacco comments:    quit 2009  Vaping Use   Vaping Use: Never used  Substance and Sexual Activity   Alcohol use: No    Alcohol/week: 0.0 standard drinks of alcohol    Comment: rarely 1 beer in last 5 years   Drug use: No    Types: Marijuana    Comment: pt stopped fall 2012   Sexual activity: Never

## 2021-12-17 ENCOUNTER — Ambulatory Visit
Admission: RE | Admit: 2021-12-17 | Discharge: 2021-12-17 | Disposition: A | Discharge status: Home / Self Care | Payer: Medicare Other | Source: Ambulatory Visit | Attending: Urology | Admitting: Urology

## 2021-12-17 DIAGNOSIS — R3129 Other microscopic hematuria: Secondary | ICD-10-CM | POA: Diagnosis not present

## 2021-12-17 DIAGNOSIS — N200 Calculus of kidney: Secondary | ICD-10-CM | POA: Diagnosis not present

## 2021-12-17 DIAGNOSIS — N1330 Unspecified hydronephrosis: Secondary | ICD-10-CM | POA: Insufficient documentation

## 2021-12-17 DIAGNOSIS — N135 Crossing vessel and stricture of ureter without hydronephrosis: Secondary | ICD-10-CM | POA: Insufficient documentation

## 2021-12-17 DIAGNOSIS — Z8546 Personal history of malignant neoplasm of prostate: Secondary | ICD-10-CM | POA: Diagnosis not present

## 2021-12-17 DIAGNOSIS — K5730 Diverticulosis of large intestine without perforation or abscess without bleeding: Secondary | ICD-10-CM | POA: Diagnosis not present

## 2021-12-17 LAB — POCT I-STAT CREATININE: Creatinine, Ser: 1.5 mg/dL — ABNORMAL HIGH (ref 0.61–1.24)

## 2021-12-17 MED ORDER — IOHEXOL 300 MG/ML  SOLN
100.0000 mL | Freq: Once | INTRAMUSCULAR | Status: AC | PRN
  Administered 2021-12-17: 100 mL via INTRAVENOUS

## 2021-12-18 ENCOUNTER — Ambulatory Visit (INDEPENDENT_AMBULATORY_CARE_PROVIDER_SITE_OTHER): Payer: Medicare Other | Admitting: Urology

## 2021-12-18 ENCOUNTER — Encounter: Payer: Self-pay | Admitting: Urology

## 2021-12-18 VITALS — BP 135/72 | HR 93 | Ht 68.0 in | Wt 198.0 lb

## 2021-12-18 DIAGNOSIS — N135 Crossing vessel and stricture of ureter without hydronephrosis: Secondary | ICD-10-CM

## 2021-12-18 DIAGNOSIS — N200 Calculus of kidney: Secondary | ICD-10-CM

## 2021-12-18 DIAGNOSIS — Z8546 Personal history of malignant neoplasm of prostate: Secondary | ICD-10-CM | POA: Diagnosis not present

## 2021-12-18 DIAGNOSIS — C61 Malignant neoplasm of prostate: Secondary | ICD-10-CM

## 2021-12-18 DIAGNOSIS — N1330 Unspecified hydronephrosis: Secondary | ICD-10-CM

## 2021-12-18 DIAGNOSIS — Z87440 Personal history of urinary (tract) infections: Secondary | ICD-10-CM | POA: Diagnosis not present

## 2021-12-18 DIAGNOSIS — N390 Urinary tract infection, site not specified: Secondary | ICD-10-CM

## 2021-12-18 DIAGNOSIS — R300 Dysuria: Secondary | ICD-10-CM | POA: Diagnosis not present

## 2021-12-18 NOTE — Progress Notes (Signed)
12/18/21 9:27 AM   [name] [birth year] [id number]  Referring provider:  [name], MD [address] [place],  [place] [address] Chief Complaint  Patient presents with   Follow-up    CTscan results     HPI: [name] is a 71 y.o.male with a personal history of prostate cancer, right UPJ obstruction and nephrolithiasis. who presents today for CT results.   He underwent a prostate biopsy on 07/18/2021. TRUS 53.6 gm. Surgical pathology was consistent with Gleason 4+3 involving 5 cores affecting up to 100%. He is currently on ADT x6 months. He is S/p Gold seed implant in 08/2021.    PET scan on 08/01/2021 visualized mildly heterogeneous uptake in the prostate. Nonspecific in this patient with reported history of prostate cancer. No signs of distant disease or nodal enlargement. RIGHT UPJ obstruction appears worse than in 07/24/17 but not as pronounced as in 07/24/16. Correlate with any RIGHT-sided symptoms. LEFT nephrolithiasis. LEFT adrenal adenoma.  Underwent a renal lasix scan in 07/24/2016 that visualized low-grade obstruction at the right ureteral pelvic junction with partial washout of activity following Lasix.  Normal left renogram curve.  Differential renal function is approximately 51.5% left and 48.5% right.  CT scan in 10/2021 visualized right hydronephrosis, bilateral nephrolithiasis, and right ureter decompressed.   He was seen in clinic on 12/12/2021 by Zara Council, PA-C. UA was grossly positive and culture grew E.coli. He was treated with Septra DS.   CTU on 12/17/2021 visualized tiny nonobstructive calculi in the collecting systems of both kidneys measuring up to 4 mm in the lower pole collecting system of the right kidney. No ureteral stones or findings of urinary tract obstruction are noted at this time. Extrarenal pelvis in the right kidney.   He reports that he takes AZO cranberry tablets and cranberry juice. He reports that he is still  experiencing burning.     PMH: Past Medical History:  Diagnosis Date   Actinic keratoses    Angina    Asthma    BCC (basal cell carcinoma of skin) 09/06/2020   left upper back lateral (EDC) ,  left upper arm anterior (EDC), left upper back medial (EDC)    Bursitis of left elbow 07/24/2016   Diabetes mellitus without complication ([place])    ED (erectile dysfunction)    HTN (hypertension) 04/12/2011   Hyperlipemia 04/12/2011   Ischemic cardiomyopathy    a. echo 07/24/2008: EF 45-50%, mild to mod ant and apical wall HK, trace MR; b. cardiac cath 06/2012: EF 40%, mild MR    Multiple vessel coronary artery disease 04/12/2011   a. remote MI 1994-07-25; b. MI Jul 25, 1995 s/p PCI - LAD & diag; c. MI 07-25-07: long stenting P-MLAD & Diag & POBA of jailed diag branch; d. cath 07-24-2008: 80% ISR of LAD w/ L-R collats, med Rx; e. cath Jul 25, 2010: 50-60% tub mLAD, 60-70% dLAD, FFR 0.75 -->s/p PCI dissection => w/ 2 stents (3 total); f. cath 06/2012: no changes from Jul 25, 2010 cath, med Rx, patent stents -> MV CAD 11/20/20 --> CABG X 5   Myocardial infarction (HCC)    x 5   Obesity    OSA (obstructive sleep apnea)    on CPAP   Osteoarthritis    Renal disorder    kidney stone    Surgical History: Past Surgical History:  Procedure Laterality Date   CORONARY ANGIOPLASTY     x 5   CORONARY ARTERY BYPASS GRAFT N/A 11/20/2020   Procedure: CORONARY  ARTERY BYPASS GRAFTING (CABG) x  FIVE (LIMA-dLAD, SVG-PDA, SVG-D1, SeqSVG-OM1-2) ON PUMP  USING LEFT INTERNAL MAMMARY ARTERTY AND LEFT ENDOSCOPIC GREATER SAPHENOUS VEIN CONDUITS, RIGHT LEG OPENED NOT HARVESTED;  Surgeon: Wonda Olds, MD;  Location: [facility];  Service: Open Heart Surgery;  Laterality: N/A;   CORONARY STENT PLACEMENT     x 7   INTRAVASCULAR PRESSURE WIRE/FFR STUDY N/A 11/15/2020   Procedure: INTRAVASCULAR PRESSURE WIRE/FFR STUDY;  Surgeon: Nelva Bush, MD;  Location: [place] [facility];  Service: Cardiovascular;  Laterality: N/A;   LEFT HEART CATH AND CORONARY ANGIOGRAPHY N/A  11/15/2020   Procedure: LEFT HEART CATH AND CORONARY ANGIOGRAPHY;  Surgeon: Nelva Bush, MD;  Location: [facility];  Service: Cardiovascular;  Laterality: N/A;   LEFT HEART CATHETERIZATION WITH CORONARY ANGIOGRAM N/A 04/14/2011   Procedure: LEFT HEART CATHETERIZATION WITH CORONARY ANGIOGRAM;  Surgeon: Pixie Casino, MD;  Location: [facility] CATH LAB;  Service: Cardiovascular;  Laterality: N/A;   LEFT HEART CATHETERIZATION WITH CORONARY ANGIOGRAM N/A 06/25/2012   Procedure: LEFT HEART CATHETERIZATION WITH CORONARY ANGIOGRAM;  Surgeon: Peter M Martinique, MD;  Location: [facility] CATH LAB;  Service: Cardiovascular;  Laterality: N/A;   TEE WITHOUT CARDIOVERSION N/A 11/20/2020   Procedure: TRANSESOPHAGEAL ECHOCARDIOGRAM (TEE);  Surgeon: Wonda Olds, MD;  Location: [facility];  Service: Open Heart Surgery;  Laterality: N/A;   TONSILLECTOMY AND ADENOIDECTOMY  1955    Home Medications:  Allergies as of 12/18/2021       Reactions   Hydroxyzine Hcl Other (See Comments)   Weakness, out of this world feeling. sluggishness   Latex Rash   I.e. Elastic= rash to blisters if worn for an extended time Patient show effects after long exposure.        Medication List        Accurate as of December 18, 2021 11:59 PM. If you have any questions, ask your nurse or doctor.          aspirin EC 325 MG tablet Take 1 tablet (325 mg total) by mouth daily.   atorvastatin 80 MG tablet Commonly known as: LIPITOR Take 1 tablet (80 mg total) by mouth daily.   blood glucose meter kit and supplies Kit Dispense based on patient and insurance preference. Use up to four times daily as directed.   carvedilol 6.25 MG tablet Commonly known as: COREG Take 1 tablet (6.25 mg total) by mouth 2 (two) times daily with a meal.   diphenhydramine-acetaminophen 25-500 MG Tabs tablet Commonly known as: TYLENOL PM Take 2 tablets by mouth at bedtime.   ezetimibe 10 MG tablet Commonly known as: ZETIA Take 1 tablet (10 mg  total) by mouth daily.   furosemide 40 MG tablet Commonly known as: LASIX Take 1 tablet (40 mg total) by mouth daily.   insulin glargine 100 UNIT/ML Solostar Pen Commonly known as: LANTUS Inject 25 Units into the skin daily.   Insulin Pen Needle 31G X 5 MM Misc Inject insulin via insulin pen daily   isosorbide mononitrate 30 MG 24 hr tablet Commonly known as: IMDUR Take 1 tablet (30 mg total) by mouth daily.   losartan 25 MG tablet Commonly known as: COZAAR Take 1 tablet (25 mg total) by mouth daily.   metFORMIN 1000 MG tablet Commonly known as: GLUCOPHAGE TAKE 1 TABLET TWICE A DAY WITH MEALS   multivitamins ther. w/minerals Tabs tablet Take 1 tablet by mouth every morning.   niacin 1000 MG CR tablet Commonly known as: NIASPAN TAKE 2 TABLETS  DAILY AT BEDTIME   potassium chloride SA 20 MEQ tablet Commonly known as: KLOR-CON M Take 1 tablet (20 mEq total) by mouth daily.   sulfamethoxazole-trimethoprim 800-160 MG tablet Commonly known as: BACTRIM DS Take 1 tablet by mouth every 12 (twelve) hours.   tamsulosin 0.4 MG Caps capsule Commonly known as: FLOMAX TAKE 1 CAPSULE DAILY AFTER SUPPER   Trulicity 3 ZO/1.0RU Sopn Generic drug: Dulaglutide Inject 3 mg as directed once a week.        Allergies:  Allergies  Allergen Reactions   Hydroxyzine Hcl Other (See Comments)    Weakness, out of this world feeling. sluggishness   Latex Rash    I.e. Elastic= rash to blisters if worn for an extended time Patient show effects after long exposure.    Family History: Family History  Problem Relation Age of Onset   COPD Mother    Other Father        muscle tumor   Stroke Father    Prostate cancer Father        mets   Heart disease Father    Alcoholism Father    Stroke Sister    Heart block Sister        Heart bypass   Bladder Cancer Neg Hx    Kidney cancer Neg Hx     Social History:  reports that he quit smoking about 14 years ago. His smoking use included  cigarettes. He has a 60.00 pack-year smoking history. He has never used smokeless tobacco. He reports that he does not drink alcohol and does not use drugs.   Physical Exam: BP 135/72   Pulse 93   Ht '5\' 8"'  (1.727 m)   Wt 198 lb (89.8 kg)   BMI 30.11 kg/m   Constitutional:  Alert and oriented, No acute distress. HEENT: [place] AT, moist mucus membranes.  Trachea midline, no masses. Cardiovascular: No clubbing, cyanosis, or edema. Respiratory: Normal respiratory effort, no increased work of breathing. Skin: No rashes, bruises or suspicious lesions. Neurologic: Grossly intact, no focal deficits, moving all 4 extremities. Psychiatric: Normal mood and affect.  Laboratory Data:  Lab Results  Component Value Date   CREATININE 1.50 (H) 12/17/2021   Lab Results  Component Value Date   HGBA1C 8.9 (A) 10/02/2021    Pertinent Imaging: CLINICAL DATA:  71 year old male with history of microscopic hematuria. History of prostate cancer. * Tracking Code: BO *   EXAM: CT ABDOMEN AND PELVIS WITHOUT AND WITH CONTRAST   TECHNIQUE: Multidetector CT imaging of the abdomen and pelvis was performed following the standard protocol before and following the bolus administration of intravenous contrast.   RADIATION DOSE REDUCTION: This exam was performed according to the departmental dose-optimization program which includes automated exposure control, adjustment of the mA and/or kV according to patient size and/or use of iterative reconstruction technique.   CONTRAST:  167m OMNIPAQUE IOHEXOL 300 MG/ML  SOLN   COMPARISON:  CT the abdomen and pelvis 10/21/2021.   FINDINGS: Lower chest: Atherosclerotic calcifications are noted in the left anterior descending, left circumflex and right coronary arteries.   Hepatobiliary: Subcentimeter low-attenuation lesion in segment 2 of the liver, too small to characterize, but statistically likely a tiny cyst or biliary hamartoma. No other suspicious cystic or  solid hepatic lesions. No intra or extrahepatic biliary ductal dilatation. Gallbladder is nearly completely decompressed, but otherwise unremarkable in appearance.   Pancreas: No pancreatic mass. No pancreatic ductal dilatation. No pancreatic or peripancreatic fluid collections or inflammatory changes.  Spleen: Unremarkable.   Adrenals/Urinary Tract: A few tiny nonobstructive calculi are noted within the collecting systems of both kidneys, measuring up to 4 mm in the lower pole collecting system of the right kidney. No additional calculi are noted along the course of either ureter, or within the lumen of the urinary bladder. No hydroureteronephrosis. Extrarenal pelvis on the right (normal anatomical variant) incidentally noted. Postcontrast images demonstrate no suspicious enhancing renal lesions. Postcontrast delayed images demonstrate no filling defect within the collecting system of either kidney, along the well opacified portions of either ureter, or within the lumen of the urinary bladder to strongly suggest the presence of urothelial neoplasm at this time. Urinary bladder is normal in appearance. Right adrenal gland is normal in appearance. 2.5 x 2.2 cm low-attenuation (5 HU) left adrenal nodule in the lateral limb of the left adrenal gland, compatible with a benign adenoma.   Stomach/Bowel: Normal appearance of the stomach. No pathologic dilatation of small bowel or colon. Multiple colonic diverticulae are noted, without surrounding inflammatory changes to indicate an acute diverticulitis at this time. Normal appendix.   Vascular/Lymphatic: Atherosclerotic calcifications in the abdominal aorta and pelvic vasculature. No lymphadenopathy noted in the abdomen or pelvis.   Reproductive: Prostate gland and seminal vesicles are unremarkable in appearance.   Other: No significant volume of ascites.  No pneumoperitoneum.   Musculoskeletal: There are no aggressive appearing  lytic or blastic lesions noted in the visualized portions of the skeleton.   IMPRESSION: 1. Tiny nonobstructive calculi in the collecting systems of both kidneys measuring up to 4 mm in the lower pole collecting system of the right kidney. No ureteral stones or findings of urinary tract obstruction are noted at this time. 2. Extrarenal pelvis in the right kidney (normal anatomical variant) incidentally noted. 3. Small left adrenal adenoma, similar to prior studies. 4. Colonic diverticulosis without evidence of acute diverticulitis at this time. 5. Aortic atherosclerosis, in addition to at least three-vessel coronary artery disease. Assessment for potential risk factor modification, dietary therapy or pharmacologic therapy may be warranted, if clinically indicated. 6. Additional incidental findings, as above.     Electronically Signed   By: Vinnie Langton M.D.   On: 12/18/2021 07:40   I have personally reviewed the images and agree with radiologist interpretation.   Assessment & Plan:    Left hydronephrosis - Resolved on recent CT   2. rUTI -Counseled him in UTI prevention supplements such as cranberry tablets, probiotics, yogurt that has active lactobacillus culture, and d-mannose  - Continue cranberry tablets   3. Right UPJ obstruction  - Stable on CT, asymptomatic  4. Prostate cancer -f/u 10/23 for PSA as scheduled  5. Dysuria -likely secondary to XRT, slowly improving   Conley Rolls as a scribe for Hollice Espy, MD.,have documented all relevant documentation on the behalf of Hollice Espy, MD,as directed by  Hollice Espy, MD while in the presence of Hollice Espy, MD.  I have reviewed the above documentation for accuracy and completeness, and I agree with the above.   Hollice Espy, MD  [facility] [address], [place], [place] [address] [phone]  I spent 30 total minutes on the day of the  encounter including pre-visit review of the medical record, face-to-face time with the patient, and post visit ordering of labs/imaging/tests.

## 2021-12-31 ENCOUNTER — Encounter: Payer: Self-pay | Admitting: Family Medicine

## 2021-12-31 ENCOUNTER — Ambulatory Visit (INDEPENDENT_AMBULATORY_CARE_PROVIDER_SITE_OTHER): Payer: Medicare Other | Admitting: Family Medicine

## 2021-12-31 VITALS — BP 120/78 | HR 93 | Temp 98.1°F | Ht 68.0 in | Wt 195.4 lb

## 2021-12-31 DIAGNOSIS — I10 Essential (primary) hypertension: Secondary | ICD-10-CM | POA: Diagnosis not present

## 2021-12-31 DIAGNOSIS — G629 Polyneuropathy, unspecified: Secondary | ICD-10-CM

## 2021-12-31 DIAGNOSIS — E1165 Type 2 diabetes mellitus with hyperglycemia: Secondary | ICD-10-CM | POA: Diagnosis not present

## 2021-12-31 DIAGNOSIS — Z23 Encounter for immunization: Secondary | ICD-10-CM

## 2021-12-31 DIAGNOSIS — R300 Dysuria: Secondary | ICD-10-CM | POA: Diagnosis not present

## 2021-12-31 DIAGNOSIS — H81399 Other peripheral vertigo, unspecified ear: Secondary | ICD-10-CM | POA: Insufficient documentation

## 2021-12-31 DIAGNOSIS — I25110 Atherosclerotic heart disease of native coronary artery with unstable angina pectoris: Secondary | ICD-10-CM | POA: Diagnosis not present

## 2021-12-31 DIAGNOSIS — F17200 Nicotine dependence, unspecified, uncomplicated: Secondary | ICD-10-CM | POA: Insufficient documentation

## 2021-12-31 LAB — BASIC METABOLIC PANEL
BUN: 14 mg/dL (ref 6–23)
CO2: 29 mEq/L (ref 19–32)
Calcium: 9.5 mg/dL (ref 8.4–10.5)
Chloride: 101 mEq/L (ref 96–112)
Creatinine, Ser: 0.85 mg/dL (ref 0.40–1.50)
GFR: 87.67 mL/min (ref >60.00)
Glucose, Bld: 206 mg/dL — ABNORMAL HIGH (ref 70–99)
Potassium: 3.8 mEq/L (ref 3.5–5.1)
Sodium: 136 mEq/L (ref 135–145)

## 2021-12-31 LAB — HEMOGLOBIN A1C: Hgb A1c MFr Bld: 8.3 % — ABNORMAL HIGH (ref 4.6–6.5)

## 2021-12-31 NOTE — Assessment & Plan Note (Signed)
Asymptomatic.  Continue risk factor management.

## 2021-12-31 NOTE — Assessment & Plan Note (Signed)
Check A1c.  He will continue Trulicity 3 mg weekly, metformin 1000 mg twice daily, and Lantus 25 units daily.

## 2021-12-31 NOTE — Patient Instructions (Signed)
Nice to see you. We will get lab work today and contact you with the results. Please monitor the numbness in your toes and let me know if it changes or worsens at all.

## 2021-12-31 NOTE — Assessment & Plan Note (Signed)
Patient with likely early neuropathy in his right second and third toes.  Discussed that this could also represent nerve impingement.  Discussed neuropathy related to diabetes is the most likely cause.  He will monitor.  If there is any significant progression in his symptoms he will let us know.  Discussed the likelihood of this ending up occurring in all of his toes.

## 2021-12-31 NOTE — Assessment & Plan Note (Signed)
Well-controlled.  He will continue carvedilol 6.25 mg twice daily, Lasix 40 mg daily, losartan 25 mg daily, and Imdur 30 mg daily.

## 2021-12-31 NOTE — Progress Notes (Signed)
[name], MD Phone: [phone]  [name] is a 71 y.o. male who presents today for f/u  HYPERTENSION/cad Disease Monitoring Chest pain- no    Dyspnea- no Medications Compliance-  taking lipitor, coreg, zetia, lasix, imdur, losartan.  Edema- no BMET    Component Value Date/Time   NA 137 10/25/2021 0405   NA 139 04/10/2021 0756   K 3.9 10/25/2021 0405   CL 105 10/25/2021 0405   CO2 24 10/25/2021 0405   GLUCOSE 159 (H) 10/25/2021 0405   BUN 17 10/25/2021 0405   BUN 17 04/10/2021 0756   CREATININE 1.50 (H) 12/17/2021 1346   CALCIUM 8.9 10/25/2021 0405   GFRNONAA >60 10/25/2021 0405   GFRAA >60 05/22/2018 1743   DIABETES Disease Monitoring: Blood Sugar ranges-135 this morning, typically around that range Polyuria/phagia/dipsia- notes improved      Optho- UTD Medications: Compliance- taking metformin, trulicity, lantus Hypoglycemic symptoms- no Patient does report some numbness at the tip of his second and third toes.  This has been going on a month or so.  Notes no injuries.  Dysuria: Patient notes this has been gradually improving.  He was treated for UTI by urology though he notes the symptoms have resolved.  He notes urology feels as though his residual dysuria is likely from his radiation therapy.   Social History   Tobacco Use  Smoking Status Former   Packs/day: 1.50   Years: 40.00   Total pack years: 60.00   Types: Cigarettes   Quit date: 09/14/2007   Years since quitting: 14.3  Smokeless Tobacco Never  Tobacco Comments   quit 2009    Current Outpatient Medications on File Prior to Visit  Medication Sig Dispense Refill   aspirin EC 325 MG EC tablet Take 1 tablet (325 mg total) by mouth daily. 30 tablet 0   atorvastatin (LIPITOR) 80 MG tablet Take 1 tablet (80 mg total) by mouth daily. 90 tablet 3   blood glucose meter kit and supplies KIT Dispense based on patient and insurance preference. Use up to four times daily as directed. 1 each 0    carvedilol (COREG) 6.25 MG tablet Take 1 tablet (6.25 mg total) by mouth 2 (two) times daily with a meal. 180 tablet 3   diphenhydramine-acetaminophen (TYLENOL PM) 25-500 MG TABS tablet Take 2 tablets by mouth at bedtime.     Dulaglutide (TRULICITY) 3 YK/9.9IP SOPN Inject 3 mg as directed once a week. 6 mL 3   ezetimibe (ZETIA) 10 MG tablet Take 1 tablet (10 mg total) by mouth daily. 90 tablet 3   furosemide (LASIX) 40 MG tablet Take 1 tablet (40 mg total) by mouth daily. 90 tablet 3   insulin glargine (LANTUS) 100 UNIT/ML Solostar Pen Inject 25 Units into the skin daily. 15 mL 0   Insulin Pen Needle 31G X 5 MM MISC Inject insulin via insulin pen daily 50 each 0   isosorbide mononitrate (IMDUR) 30 MG 24 hr tablet Take 1 tablet (30 mg total) by mouth daily. 90 tablet 3   metFORMIN (GLUCOPHAGE) 1000 MG tablet TAKE 1 TABLET TWICE A DAY WITH MEALS 180 tablet 3   Multiple Vitamins-Minerals (MULTIVITAMINS THER. W/MINERALS) TABS Take 1 tablet by mouth every morning.     niacin (NIASPAN) 1000 MG CR tablet TAKE 2 TABLETS DAILY AT BEDTIME 180 tablet 0   potassium chloride SA (KLOR-CON) 20 MEQ tablet Take 1 tablet (20 mEq total) by mouth daily. 90 tablet 3   sulfamethoxazole-trimethoprim (BACTRIM DS) 800-160 MG  tablet Take 1 tablet by mouth every 12 (twelve) hours. 14 tablet 0   tamsulosin (FLOMAX) 0.4 MG CAPS capsule TAKE 1 CAPSULE DAILY AFTER SUPPER 90 capsule 3   losartan (COZAAR) 25 MG tablet Take 1 tablet (25 mg total) by mouth daily. 90 tablet 3   No current facility-administered medications on file prior to visit.     ROS see history of present illness  Objective  Physical Exam Vitals:   12/31/21 0917  BP: 120/78  Pulse: 93  Temp: 98.1 F (36.7 C)  SpO2: 97%    BP Readings from Last 3 Encounters:  12/31/21 120/78  12/18/21 135/72  12/12/21 123/71   Wt Readings from Last 3 Encounters:  12/31/21 195 lb 6.4 oz (88.6 kg)  12/18/21 198 lb (89.8 kg)  12/12/21 194 lb (88 kg)     Physical Exam Constitutional:      General: He is not in acute distress.    Appearance: He is not diaphoretic.  Cardiovascular:     Rate and Rhythm: Normal rate and regular rhythm.     Heart sounds: Normal heart sounds.  Pulmonary:     Effort: Pulmonary effort is normal.     Breath sounds: Normal breath sounds.  Skin:    General: Skin is warm and dry.  Neurological:     Mental Status: He is alert.     Comments: Slight decrease sensation to light touch in the distal portion of his right second and third toes, his other toes have intact light touch sensation      Assessment/Plan: Please see individual problem list.  Problem List Items Addressed This Visit     CAD s/p CABG (coronary artery disease) (Chronic)    Asymptomatic.  Continue risk factor management.      HTN (hypertension) (Chronic)    Well-controlled.  He will continue carvedilol 6.25 mg twice daily, Lasix 40 mg daily, losartan 25 mg daily, and Imdur 30 mg daily.      Relevant Orders   Basic Metabolic Panel (BMET)   Uncontrolled type 2 diabetes mellitus with hyperglycemia, without long-term current use of insulin (HCC) (Chronic)    Check A1c.  He will continue Trulicity 3 mg weekly, metformin 1000 mg twice daily, and Lantus 25 units daily.      Relevant Orders   HgB A1c   Dysuria    Residual dysuria is likely related to his radiation therapy.  He will monitor for any worsening discomfort and if that occurs he will let us or urology know.      Neuropathy - Primary    Patient with likely early neuropathy in his right second and third toes.  Discussed that this could also represent nerve impingement.  Discussed neuropathy related to diabetes is the most likely cause.  He will monitor.  If there is any significant progression in his symptoms he will let us know.  Discussed the likelihood of this ending up occurring in all of his toes.      Other Visit Diagnoses     Need for immunization against influenza        Relevant Orders   Flu Vaccine QUAD 36moIM (Fluarix, Fluzone & Alfiuria Quad PF) (Completed)      Health maintenance: Patient defers colonoscopy at this time given he has just completed treatment for prostate cancer and he wants to give things time to heal.  He notes he would be potentially willing to have the colonoscopy done in about 6 months.  We will revisit  this at his next visit.  Return in about 3 months (around 04/02/2022) for Diabetes.   [name], MD [place]

## 2021-12-31 NOTE — Assessment & Plan Note (Signed)
Residual dysuria is likely related to his radiation therapy.  He will monitor for any worsening discomfort and if that occurs he will let us or urology know.

## 2022-01-15 ENCOUNTER — Telehealth: Payer: Self-pay | Admitting: Family Medicine

## 2022-01-15 NOTE — Telephone Encounter (Signed)
Patient needs his insulin glargine (LANTUS) 100 UNIT/ML Solostar Pen refill sent to [facility].

## 2022-01-16 ENCOUNTER — Other Ambulatory Visit: Payer: Self-pay | Admitting: Cardiovascular Disease

## 2022-01-17 ENCOUNTER — Other Ambulatory Visit: Payer: Self-pay

## 2022-01-17 MED ORDER — INSULIN GLARGINE 100 UNIT/ML SOLOSTAR PEN: 25.0000 [IU] | PEN_INJECTOR | Freq: Every day | SUBCUTANEOUS | 0 refills | Status: DC

## 2022-02-03 ENCOUNTER — Ambulatory Visit (INDEPENDENT_AMBULATORY_CARE_PROVIDER_SITE_OTHER): Payer: Medicare Other | Admitting: Orthopaedic Surgery

## 2022-02-03 DIAGNOSIS — I25110 Atherosclerotic heart disease of native coronary artery with unstable angina pectoris: Secondary | ICD-10-CM | POA: Diagnosis not present

## 2022-02-03 DIAGNOSIS — G8929 Other chronic pain: Secondary | ICD-10-CM | POA: Diagnosis not present

## 2022-02-03 DIAGNOSIS — M25511 Pain in right shoulder: Secondary | ICD-10-CM

## 2022-02-03 MED ORDER — METHYLPREDNISOLONE ACETATE 40 MG/ML IJ SUSP
40.0000 mg | INTRAMUSCULAR | Status: AC | PRN
  Administered 2022-02-03: 40 mg via INTRA_ARTICULAR

## 2022-02-03 MED ORDER — LIDOCAINE HCL 1 % IJ SOLN
3.0000 mL | INTRAMUSCULAR | Status: AC | PRN
  Administered 2022-02-03: 3 mL

## 2022-02-03 NOTE — Progress Notes (Signed)
The patient comes in still dealing with significant chronic right shoulder pain.  We had to hold off on a steroid injection at his last visit due to recurrent UTIs and urosepsis.  He still dealing with blood glucose levels that are hard to control.  His right shoulder is hurting him so severely at this point he wishes to at least have a steroid injection in it today.  I did explain in detail the risk and benefits of this type of injection and he understands to watch her blood glucose closely.  His right shoulder shows definitely signs of impingement with pain throughout the arc of motion of the shoulder as well as limited abduction and external rotation with certainly stiffness in all planes of motion.  There is pain to the [facility] joint as well.  His x-rays from a month ago showed AC joint arthritic changes and evidence of impingement.  The shoulder is well located.  I did feel it was reasonable to place a steroid injection in his right shoulder subacromial outlet today.  He will watch his blood glucose closely.  All questions and concerns were answered and addressed.  Follow-up is as needed.  Procedure Note  Patient: [name]             Date of Birth: [birth year]           MRN: [id number]             Visit Date: 02/03/2022  Procedures: Visit Diagnoses:  1. Chronic right shoulder pain     Large Joint Inj: R subacromial bursa on 02/03/2022 9:02 AM Indications: pain and diagnostic evaluation Details: 22 G 1.5 in needle  Arthrogram: No  Medications: 3 mL lidocaine 1 %; 40 mg methylPREDNISolone acetate 40 MG/ML Outcome: tolerated well, no immediate complications Procedure, treatment alternatives, risks and benefits explained, specific risks discussed. Consent was given by the patient. Immediately prior to procedure a time out was called to verify the correct patient, procedure, equipment, support staff and site/side marked as required. Patient was prepped and draped in the usual sterile  fashion.

## 2022-02-19 ENCOUNTER — Ambulatory Visit (INDEPENDENT_AMBULATORY_CARE_PROVIDER_SITE_OTHER): Payer: Medicare Other | Admitting: Dermatology

## 2022-02-19 DIAGNOSIS — L570 Actinic keratosis: Secondary | ICD-10-CM | POA: Diagnosis not present

## 2022-02-19 DIAGNOSIS — Z872 Personal history of diseases of the skin and subcutaneous tissue: Secondary | ICD-10-CM | POA: Diagnosis not present

## 2022-02-19 DIAGNOSIS — B079 Viral wart, unspecified: Secondary | ICD-10-CM | POA: Diagnosis not present

## 2022-02-19 DIAGNOSIS — L821 Other seborrheic keratosis: Secondary | ICD-10-CM

## 2022-02-19 NOTE — Patient Instructions (Addendum)
Cantharidin Plus is a blistering agent that comes from a beetle.  It needs to be washed off in about 4 hours after application.  Although it is painless when applied in office, it may cause symptoms of mild pain and burning several hours later.  Treated areas will swell and turn red, and blisters may form.  Vaseline and a bandaid may be applied until wound has healed.  Once healed, the skin may remain temporarily discolored.  It can take weeks to months for pigmentation to return to normal.  Advised to wash off with soap and water in 4 hours or sooner if it becomes tender before then.     Due to recent changes in healthcare laws, you may see results of your pathology and/or laboratory studies on MyChart before the doctors have had a chance to review them. We understand that in some cases there may be results that are confusing or concerning to you. Please understand that not all results are received at the same time and often the doctors may need to interpret multiple results in order to provide you with the best plan of care or course of treatment. Therefore, we ask that you please give [place] 2 business days to thoroughly review all your results before contacting the office for clarification. Should we see a critical lab result, you will be contacted sooner.   If You Need Anything After Your Visit  If you have any questions or concerns for your doctor, please call our main line at [phone] and press option 4 to reach your doctor's medical assistant. If no one answers, please leave a voicemail as directed and we will return your call as soon as possible. Messages left after 4 pm will be answered the following business day.   You may also send [place] a message via [place]. We typically respond to MyChart messages within 1-2 business days.  For prescription refills, please ask your pharmacy to contact our office. Our fax number is [phone].  If you have an urgent issue when the clinic is closed that  cannot wait until the next business day, you can page your doctor at the number below.    Please note that while we do our best to be available for urgent issues outside of office hours, we are not available 24/7.   If you have an urgent issue and are unable to reach [place], you may choose to seek medical care at your doctor's office, retail clinic, urgent care center, or emergency room.  If you have a medical emergency, please immediately call 911 or go to the emergency department.  Pager Numbers  - Dr. Nehemiah Massed: [phone]  - Dr. Laurence Ferrari: [phone]  - Dr. Nicole Kindred: [phone]  In the event of inclement weather, please call our main line at [phone] for an update on the status of any delays or closures.  Dermatology Medication Tips: Please keep the boxes that topical medications come in in order to help keep track of the instructions about where and how to use these. Pharmacies typically print the medication instructions only on the boxes and not directly on the medication tubes.   If your medication is too expensive, please contact our office at [phone] option 4 or send [place] a message through [place].   We are unable to tell what your co-pay for medications will be in advance as this is different depending on your insurance coverage. However, we may be able to find a substitute medication at lower cost or fill out paperwork  to get insurance to cover a needed medication.   If a prior authorization is required to get your medication covered by your insurance company, please allow [place] 1-2 business days to complete this process.  Drug prices often vary depending on where the prescription is filled and some pharmacies may offer cheaper prices.  The website www.goodrx.com contains coupons for medications through different pharmacies. The prices here do not account for what the cost may be with help from insurance (it may be cheaper with your insurance), but the website can give you the  price if you did not use any insurance.  - You can print the associated coupon and take it with your prescription to the pharmacy.  - You may also stop by our office during regular business hours and pick up a GoodRx coupon card.  - If you need your prescription sent electronically to a different pharmacy, notify our office through [facility] or by phone at [phone] option 4.     Si Usted Necesita Algo Despus de Su Visita  Tambin puede enviarnos un mensaje a travs de Pharmacist, community. Por lo general respondemos a los mensajes de MyChart en el transcurso de 1 a 2 das hbiles.  Para renovar recetas, por favor pida a su farmacia que se ponga en contacto con nuestra oficina. Harland Dingwall de fax es [place] [phone].  Si tiene un asunto urgente cuando la clnica est cerrada y que no puede esperar hasta el siguiente da hbil, puede llamar/localizar a su doctor(a) al nmero que aparece a continuacin.   Por favor, tenga en cuenta que aunque hacemos todo lo posible para estar disponibles para asuntos urgentes fuera del horario de [place], no estamos disponibles las 24 horas del da, los 7 das de la [place].   Si tiene un problema urgente y no puede comunicarse con nosotros, puede optar por buscar atencin mdica  en el consultorio de su doctor(a), en una clnica privada, en un centro de atencin urgente o en una sala de emergencias.  Si tiene Engineering geologist, por favor llame inmediatamente al 911 o vaya a la sala de emergencias.  Nmeros de bper  - Dr. Nehemiah Massed: [phone]  - Dra. Moye: [phone]  - Dra. Nicole Kindred: [phone]  En caso de inclemencias del [place], por favor llame a Johnsie Kindred principal al [phone] para una actualizacin sobre el [place] de cualquier retraso o cierre.  Consejos para la medicacin en dermatologa: Por favor, guarde las cajas en las que vienen los medicamentos de uso tpico para ayudarle a seguir las instrucciones sobre dnde y cmo  usarlos. Las farmacias generalmente imprimen las instrucciones del medicamento slo en las cajas y no directamente en los tubos del [place].   Si su medicamento es muy caro, por favor, pngase en contacto con Zigmund Daniel llamando al [phone] y presione la opcin 4 o envenos un mensaje a travs de Pharmacist, community.   No podemos decirle cul ser su copago por los medicamentos por adelantado ya que esto es diferente dependiendo de la cobertura de su seguro. Sin embargo, es posible que podamos encontrar un medicamento sustituto a Electrical engineer un formulario para que el seguro cubra el medicamento que se considera necesario.   Si se requiere una autorizacin previa para que su compaa de seguros [place] su medicamento, por favor permtanos de 1 a 2 das hbiles para completar este proceso.  Los precios de los medicamentos varan con frecuencia dependiendo del Environmental consultant de dnde se surte la receta y [place]  pueden ofrecer precios ms baratos.  El sitio web www.goodrx.com tiene cupones para medicamentos de Airline pilot. Los precios aqu no tienen en cuenta lo que podra costar con la ayuda del seguro (puede ser ms barato con su seguro), pero el sitio web puede darle el precio si no utiliz Research scientist (physical sciences).  - Puede imprimir el cupn correspondiente y llevarlo con su receta a la farmacia.  - Tambin puede pasar por nuestra oficina durante el horario de atencin regular y Charity fundraiser una tarjeta de cupones de GoodRx.  - Si necesita que su receta se enve electrnicamente a una farmacia diferente, informe a nuestra oficina a travs de MyChart de [place] o por telfono llamando al [phone] y presione la opcin 4.

## 2022-02-19 NOTE — Progress Notes (Signed)
Follow-Up Visit   Subjective  [name] is a 71 y.o. male who presents for the following: Actinic Keratosis (Patient here for follow up on aks. Reports areas at right scalp and above left eyebrow have cleared. Does feel a rough spot above right eyebrow. ).  The patient has spots, moles and lesions to be evaluated, some may be new or changing and the patient has concerns that these could be cancer.   The following portions of the chart were reviewed this encounter and updated as appropriate:  Tobacco  Allergies  Meds  Problems  Med Hx  Surg Hx  Fam Hx      Review of Systems: No other skin or systemic complaints except as noted in HPI or Assessment and Plan.   Objective  Well appearing patient in no apparent distress; mood and affect are within normal limits.  A focused examination was performed including face, scalp, hands. Relevant physical exam findings are noted in the Assessment and Plan.  right forehead x 1 Erythematous thin papules/macules with gritty scale.   right thumb x 1 Verrucous papules -- Discussed viral etiology and contagion.    Assessment & Plan  Actinic keratosis right forehead x 1  Actinic keratoses are precancerous spots that appear secondary to cumulative UV radiation exposure/sun exposure over time. They are chronic with expected duration over 1 year. A portion of actinic keratoses will progress to squamous cell carcinoma of the skin. It is not possible to reliably predict which spots will progress to skin cancer and so treatment is recommended to prevent development of skin cancer.  Recommend daily broad spectrum sunscreen SPF 30+ to sun-exposed areas, reapply every 2 hours as needed.  Recommend staying in the shade or wearing long sleeves, sun glasses (UVA+UVB protection) and wide brim hats (4-inch brim around the entire circumference of the hat). Call for new or changing lesions.  Destruction of lesion - right forehead x  1  Destruction method: cryotherapy   Informed consent: discussed and consent obtained   Lesion destroyed using liquid nitrogen: Yes   Cryotherapy cycles:  2 Outcome: patient tolerated procedure well with no complications   Post-procedure details: wound care instructions given   Additional details:  Prior to procedure, discussed risks of blister formation, small wound, skin dyspigmentation, or rare scar following cryotherapy. Recommend Vaseline ointment to treated areas while healing.   Viral warts, unspecified type right thumb x 1  Biopsy proven periungual verruca  Discussed viral etiology and risk of spread.  Discussed multiple treatments may be required to clear warts.  Discussed possible post-treatment dyspigmentation and risk of recurrence.  Cantharidin Plus is a blistering agent that comes from a beetle.  It needs to be washed off in about 4 hours after application.  Although it is painless when applied in office, it may cause symptoms of mild pain and burning several hours later.  Treated areas will swell and turn red, and blisters may form.  Vaseline and a bandaid may be applied until wound has healed.  Once healed, the skin may remain temporarily discolored.  It can take weeks to months for pigmentation to return to normal.  Advised to wash off with soap and water in 4 hours or sooner if it becomes tender before then.   Destruction of lesion - right thumb x 1 Complexity: simple   Destruction method: cryotherapy   Informed consent: discussed and consent obtained   Timeout:  patient name, date of birth, surgical site, and procedure verified Lesion  destroyed using liquid nitrogen: Yes   Region frozen until ice ball extended beyond lesion: Yes   Cryotherapy cycles:  2 Outcome: patient tolerated procedure well with no complications   Post-procedure details: wound care instructions given   Additional details:  Prior to procedure, discussed risks of blister formation, small wound,  skin dyspigmentation, or rare scar following cryotherapy. Recommend Vaseline ointment to treated areas while healing.    Destruction of lesion - right thumb x 1  Destruction method: chemical removal   Informed consent: discussed and consent obtained   Timeout:  patient name, date of birth, surgical site, and procedure verified Chemical destruction method: cantharidin   Chemical destruction method comment:  Plus Application time:  4 hours Procedure instructions: patient instructed to wash and dry area   Outcome: patient tolerated procedure well with no complications   Post-procedure details: wound care instructions given   Additional details:  Squaric Acid 3% applied to warts today. Prior to application reviewed risk of inflammation and irritation.    History of PreCancerous Actinic Keratosis  Above left eyebrow and right scalp - site(s) of PreCancerous Actinic Keratosis clear today. - these may recur and new lesions may form requiring treatment to prevent transformation into skin cancer - observe for new or changing spots and contact [place] for appointment if occur - photoprotection with sun protective clothing; sunglasses and broad spectrum sunscreen with SPF of at least 30 + and frequent self skin exams recommended - yearly exams by a dermatologist recommended for persons with history of PreCancerous Actinic Keratoses  Seborrheic Keratoses - Stuck-on, waxy, tan-brown papules and/or plaques  - Benign-appearing - Discussed benign etiology and prognosis. - Observe - Call for any changes  Return for 1 month wart follow up, 9 mth tbse .  I, Ruthell Rummage, CMA, am acting as scribe for Forest Gleason, MD.  Documentation: I have reviewed the above documentation for accuracy and completeness, and I agree with the above.  Forest Gleason, MD

## 2022-02-20 ENCOUNTER — Encounter: Payer: Self-pay | Admitting: Dermatology

## 2022-02-21 ENCOUNTER — Other Ambulatory Visit: Payer: Medicare Other

## 2022-02-21 ENCOUNTER — Encounter: Payer: Self-pay | Admitting: Urology

## 2022-02-25 ENCOUNTER — Other Ambulatory Visit: Payer: Medicare Other

## 2022-02-25 DIAGNOSIS — C61 Malignant neoplasm of prostate: Secondary | ICD-10-CM

## 2022-02-26 LAB — PSA: Prostate Specific Ag, Serum: <0.1 ng/mL (ref 0.0–4.0)

## 2022-03-04 ENCOUNTER — Ambulatory Visit
Admission: EM | Admit: 2022-03-04 | Discharge: 2022-03-04 | Disposition: A | Discharge status: Home / Self Care | Payer: Medicare Other | Attending: Urgent Care | Admitting: Urgent Care

## 2022-03-04 DIAGNOSIS — J019 Acute sinusitis, unspecified: Secondary | ICD-10-CM | POA: Diagnosis not present

## 2022-03-04 DIAGNOSIS — B9689 Other specified bacterial agents as the cause of diseases classified elsewhere: Secondary | ICD-10-CM | POA: Diagnosis not present

## 2022-03-04 MED ORDER — AMOXICILLIN-POT CLAVULANATE 875-125 MG PO TABS: 1.0000 | ORAL_TABLET | Freq: Two times a day (BID) | ORAL | 0 refills | Status: DC

## 2022-03-04 NOTE — Discharge Instructions (Addendum)
Follow up here or with your primary care provider if your symptoms are worsening or not improving with treatment.     

## 2022-03-04 NOTE — ED Triage Notes (Signed)
Pt. Present to UC w/ c/o ear fullness and sinus pressure that started two weeks ago.Pt. also endorses some balance issues since having a decrease in his hearing.

## 2022-03-04 NOTE — ED Provider Notes (Signed)
Roderic Palau    CSN: [id number] Arrival date & time: 03/04/22  1106      History   Chief Complaint Chief Complaint  Patient presents with   Ear Fullness   Facial Pain    HPI Joshuah Minella is a 71 y.o. male.    Ear Fullness    Presents to UC with complaint of ear fullness and sinus pressure x2 weeks.  Complains of balance issues which he associates with a decrease in his hearing.  Past Medical History:  Diagnosis Date   Actinic keratoses    Angina    Asthma    BCC (basal cell carcinoma of skin) 09/06/2020   left upper back lateral (EDC) ,  left upper arm anterior (EDC), left upper back medial (EDC)    Bursitis of left elbow 2018   Diabetes mellitus without complication [facility])    ED (erectile dysfunction)    HTN (hypertension) 04/12/2011   Hyperlipemia 04/12/2011   Ischemic cardiomyopathy    a. echo 2010: EF 45-50%, mild to mod ant and apical wall HK, trace MR; b. cardiac cath 06/2012: EF 40%, mild MR    Multiple vessel coronary artery disease 04/12/2011   a. remote MI 1996; b. MI 1997 s/p PCI - LAD & diag; c. MI 2009: long stenting P-MLAD & Diag & POBA of jailed diag branch; d. cath 2010: 80% ISR of LAD w/ L-R collats, med Rx; e. cath 2012: 50-60% tub mLAD, 60-70% dLAD, FFR 0.75 -->s/p PCI dissection => w/ 2 stents (3 total); f. 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   Multiple vessel coronary artery disease 11/20/2020    Colon cancer screening 10/18/2019   Bone spicules of jaw 12/27/2018   Insomnia 02/18/2018   Adrenal adenoma 03/03/2017   Right shoulder pain 03/03/2017   OSA (obstructive sleep apnea) 07/30/2016   Low back pain 05/15/2016   Actinic keratoses 02/07/2016   Right hip pain 02/07/2016   Ischemic cardiomyopathy    Uncontrolled type 2 diabetes mellitus with hyperglycemia, without long-term current use of insulin ([place]) 11/30/2014   Sleep disturbance 08/08/2013   CAD s/p CABG (coronary artery disease) 04/12/2011   HTN (hypertension) 04/12/2011   Hyperlipemia 04/12/2011   Hypoxemia 04/12/2011   Prostate cancer ([place]) 04/12/2011    Past Surgical History:  Procedure Laterality Date   CORONARY ANGIOPLASTY     x 5   CORONARY ARTERY BYPASS GRAFT N/A 11/20/2020   Procedure: CORONARY ARTERY BYPASS GRAFTING (CABG) x  FIVE (LIMA-dLAD, SVG-PDA, SVG-D1, SeqSVG-OM1-2) ON PUMP  USING LEFT INTERNAL MAMMARY ARTERTY AND LEFT ENDOSCOPIC GREATER SAPHENOUS VEIN CONDUITS, RIGHT LEG OPENED NOT HARVESTED;  Surgeon: Wonda Olds, MD;  Location: [place];  Service: Open Heart Surgery;  Laterality: N/A;   CORONARY STENT PLACEMENT     x 7   INTRAVASCULAR PRESSURE WIRE/FFR STUDY  N/A 11/15/2020   Procedure: INTRAVASCULAR PRESSURE WIRE/FFR STUDY;  Surgeon: Nelva Bush, MD;  Location: [place] CV LAB;  Service: Cardiovascular;  Laterality: N/A;   LEFT HEART CATH AND CORONARY ANGIOGRAPHY N/A 11/15/2020   Procedure: LEFT HEART CATH AND CORONARY ANGIOGRAPHY;  Surgeon: Nelva Bush, MD;  Location: [place] CV LAB;  Service: Cardiovascular;  Laterality: N/A;   LEFT HEART CATHETERIZATION WITH CORONARY ANGIOGRAM N/A 04/14/2011   Procedure: LEFT HEART CATHETERIZATION WITH CORONARY ANGIOGRAM;  Surgeon: Pixie Casino, MD;  Location: [facility] CATH LAB;  Service: Cardiovascular;  Laterality: N/A;   LEFT HEART CATHETERIZATION WITH CORONARY ANGIOGRAM N/A 06/25/2012   Procedure: LEFT HEART CATHETERIZATION WITH CORONARY  ANGIOGRAM;  Surgeon: Peter M Martinique, MD;  Location: [facility] CATH LAB;  Service: Cardiovascular;  Laterality: N/A;   TEE WITHOUT CARDIOVERSION N/A 11/20/2020   Procedure: TRANSESOPHAGEAL ECHOCARDIOGRAM (TEE);  Surgeon: Wonda Olds, MD;  Location: [place];  Service: Open Heart Surgery;  Laterality: N/A;   TONSILLECTOMY AND ADENOIDECTOMY  1955       Home Medications    Prior to Admission medications   Medication Sig Start Date End Date Taking? Authorizing Provider  aspirin EC 325 MG EC tablet Take 1 tablet (325 mg total) by mouth daily. 11/23/20   Nani Skillern, PA-C  atorvastatin (LIPITOR) 80 MG tablet Take 1 tablet (80 mg total) by mouth daily. 03/11/21   Minna Merritts, MD  blood glucose meter kit and supplies KIT Dispense based on patient and insurance preference. Use up to four times daily as directed. 10/25/21   Patrecia Pour, MD  carvedilol (COREG) 6.25 MG tablet Take 1 tablet (6.25 mg total) by mouth 2 (two) times daily with a meal. 03/11/21   Gollan, Kathlene November, MD  diphenhydramine-acetaminophen (TYLENOL PM) 25-500 MG TABS tablet Take 2 tablets by mouth at bedtime.    [provider]  Dulaglutide (TRULICITY) 3 JT/7.0VX SOPN Inject 3 mg as directed once a week. 11/11/21   [name], MD  ezetimibe (ZETIA) 10 MG tablet Take 1 tablet (10 mg total) by mouth daily. 12/07/20   Minna Merritts, MD  furosemide (LASIX) 40 MG tablet Take 1 tablet (40 mg total) by mouth daily. 03/11/21   Minna Merritts, MD  insulin glargine (LANTUS) 100 UNIT/ML Solostar Pen Inject 25 Units into the skin daily. 01/17/22   Dutch Quint B, FNP  Insulin Pen Needle 31G X 5 MM MISC Inject insulin via insulin pen daily 10/25/21   Patrecia Pour, MD  isosorbide mononitrate (IMDUR) 30 MG 24 hr tablet Take 1 tablet (30 mg total) by mouth daily. 03/11/21   Minna Merritts, MD  losartan (COZAAR) 25 MG tablet Take 1 tablet (25 mg total) by mouth daily. 03/11/21 12/09/21  Minna Merritts, MD   metFORMIN (GLUCOPHAGE) 1000 MG tablet TAKE 1 TABLET TWICE A DAY WITH MEALS 10/04/21   [name], MD  Multiple Vitamins-Minerals (MULTIVITAMINS THER. W/MINERALS) TABS Take 1 tablet by mouth every morning.    [provider]  niacin (NIASPAN) 1000 MG CR tablet TAKE 2 TABLETS DAILY AT BEDTIME 01/16/22   Gollan, Kathlene November, MD  potassium chloride SA (KLOR-CON) 20 MEQ tablet Take 1 tablet (20 mEq total) by mouth daily. 03/11/21   Minna Merritts, MD  sulfamethoxazole-trimethoprim (BACTRIM DS) 800-160 MG tablet Take 1 tablet by mouth every 12 (twelve) hours. 12/12/21   Zara Council A, PA-C  tamsulosin (FLOMAX) 0.4 MG CAPS capsule TAKE 1 CAPSULE DAILY AFTER  SUPPER 12/12/21   Noreene Filbert, MD    Family History Family History  Problem Relation Age of Onset   COPD Mother    Other Father        muscle tumor   Stroke Father    Prostate cancer Father        mets   Heart disease Father    Alcoholism Father    Stroke Sister    Heart block Sister        Heart bypass   Bladder Cancer Neg Hx    Kidney cancer Neg Hx     Social History Social History   Tobacco Use   Smoking status: Former    Packs/day: 1.50    Years: 40.00    Total pack years: 60.00    Types: Cigarettes    Quit date: 09/14/2007    Years since quitting: 14.4   Smokeless tobacco: Never   Tobacco comments:    quit 2009  Vaping Use   Vaping Use: Never used  Substance Use Topics   Alcohol use: No    Alcohol/week: 0.0 standard drinks of alcohol    Comment: rarely 1 beer in last 5 years   Drug use: No    Types: Marijuana    Comment: pt stopped fall 2012     Allergies   Hydroxyzine hcl and Latex   Review of Systems Review of Systems   Physical Exam Triage Vital Signs ED Triage Vitals  Enc Vitals Group     BP 03/04/22 1211 (!) 143/87     Pulse Rate 03/04/22 1211 84     Resp 03/04/22 1211 19     Temp 03/04/22 1211 97.8 F (36.6 C)     Temp src --      SpO2 03/04/22 1211 96 %     Weight --       Height --      Head Circumference --      Peak Flow --      Pain Score 03/04/22 1212 0     Pain Loc --      Pain Edu? --      Excl. in [place]? --    No data found.  Updated Vital Signs BP (!) 143/87   Pulse 84   Temp 97.8 F (36.6 C)   Resp 19   SpO2 96%   Visual Acuity Right Eye Distance:   Left Eye Distance:   Bilateral Distance:    Right Eye Near:   Left Eye Near:    Bilateral Near:     Physical Exam Vitals reviewed.  Constitutional:      Appearance: Normal appearance.  HENT:     Right Ear: Decreased hearing noted. Tympanic membrane is not injected.     Left Ear: Decreased hearing noted. A middle ear effusion is present. Tympanic membrane is not injected.     Nose:     Right Sinus: Maxillary sinus tenderness and frontal sinus tenderness present.     Left Sinus: Maxillary sinus tenderness and frontal sinus tenderness present.  Skin:    General: Skin is warm and dry.  Neurological:     General: No focal deficit present.     Mental Status: He is alert and oriented to person, place, and time.  Psychiatric:        Mood and Affect: Mood normal.        Behavior: Behavior normal.      UC Treatments / Results  Labs (all labs ordered are  listed, but only abnormal results are displayed) Labs Reviewed - No data to display  EKG   Radiology No results found.  Procedures Procedures (including critical care time)  Medications Ordered in UC Medications - No data to display  Initial Impression / Assessment and Plan / UC Course  I have reviewed the triage vital signs and the nursing notes.  Pertinent labs & imaging results that were available during my care of the patient were reviewed by me and considered in my medical decision making (see chart for details).   No sinusitis x2 weeks, suspect bacterial process and will treat with Augmentin.  Patient has history of DM 2 so p.o. corticosteroid is contraindicated.  Recommended use of Flonase or Nasacort nasal  steroid spray twice a day.   Final Clinical Impressions(s) / UC Diagnoses   Final diagnoses:  None   Discharge Instructions   None    ED Prescriptions   None    PDMP not reviewed this encounter.   Rose Phi, [place] 03/04/22 1233

## 2022-03-05 ENCOUNTER — Ambulatory Visit
Admission: RE | Admit: 2022-03-05 | Discharge: 2022-03-05 | Disposition: A | Discharge status: Home / Self Care | Payer: Medicare Other | Source: Ambulatory Visit | Attending: Radiation Oncology | Admitting: Radiation Oncology

## 2022-03-05 ENCOUNTER — Encounter: Payer: Self-pay | Admitting: Radiation Oncology

## 2022-03-05 ENCOUNTER — Ambulatory Visit: Payer: Medicare Other | Admitting: Urology

## 2022-03-05 ENCOUNTER — Other Ambulatory Visit: Payer: Self-pay | Admitting: *Deleted

## 2022-03-05 VITALS — BP 138/80 | HR 88 | Temp 97.6°F | Resp 16 | Wt 197.6 lb

## 2022-03-05 DIAGNOSIS — Z191 Hormone sensitive malignancy status: Secondary | ICD-10-CM | POA: Diagnosis not present

## 2022-03-05 DIAGNOSIS — Z923 Personal history of irradiation: Secondary | ICD-10-CM | POA: Diagnosis not present

## 2022-03-05 DIAGNOSIS — C61 Malignant neoplasm of prostate: Secondary | ICD-10-CM | POA: Diagnosis not present

## 2022-03-05 NOTE — Progress Notes (Incomplete)
03/05/2022 1:32 PM   [name] July 22, 1950 [id number]  Referring provider: [name], MD [address] [place],  [place] [address]  No chief complaint on file.   HPI: 71 year old male with a personal history of prostate cancer, right UPJ obstruction and nephrolithiasis who returns today for follow-up.  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  TRUS 53.6 gm. Surgical pathology was consistent with Gleason 4+3 involving 5 cores affecting up to 100%. He is currently on ADT x6 months. He is S/p Gold seed implant in 08/2021.   He also received 41-monthDepo of ADT.   PMH: Past Medical History:  Diagnosis Date   Actinic keratoses    Angina    Asthma    BCC (basal cell carcinoma of skin) 09/06/2020   left upper back lateral (EDC) ,  left upper arm anterior (EDC), left upper back medial (EDC)    Bursitis of left elbow 2018   Di[place] mellitus [place] complication [place]    ED (erectile dysfunction)    HTN (hypertension) 04/12/2011   Hyperlipemia 04/12/2011   Ischemic cardiomyopathy    a. echo 2010: EF 45-50%, mild to mod ant and apical wall HK, trace MR; b. cardiac cath 06/2012: EF 40%, mild MR    Multiple vessel coronary artery disease 04/12/2011   a. remote MI 1996; b. MI 1997 s/p PCI - LAD & diag; c. MI 2009: long stenting P-MLAD & Diag & POBA of jailed diag branch; d. cath 2010: 80% ISR of LAD w/ L-R collats, med Rx; e. cath 2012: 50-60% tub mLAD, 60-70% dLAD, FFR 0.75 -->s/p PCI dissection => w/ 2 stents (3 total); f. cath 06/2012: no changes from 2012 cath, med Rx, patent stents -> MV CAD 11/20/20 --> CABG X 5   Myocardial infarction (HCC)    x 5   Obesity    OSA (obstructive sleep apnea)    on CPAP   Osteoarthritis    Renal disorder    kidney stone    Surgical History: Past Surgical History:  Procedure Laterality Date   CORONARY ANGIOPLASTY     x 5   CORONARY ARTERY BYPASS GRAFT N/A 11/20/2020   Procedure:  CORONARY ARTERY BYPASS GRAFTING (CABG) x  FIVE (LIMA-dLAD, SVG-PDA, SVG-D1, SeqSVG-OM1-2) ON PUMP  USING LEFT INTERNAL MAMMARY ARTERTY AND LEFT ENDOSCOPIC GREATER SAPHENOUS VEIN CONDUITS, RIGHT LEG OPENED NOT HARVESTED;  Surgeon: AWonda Olds MD;  [place]  Service: Open Heart Surgery;  Laterality: N/A;   CORONARY STENT PLACEMENT     x 7   INTRAVASCULAR PRESSURE WIRE/FFR STUDY N/A 11/15/2020   Procedure: INTRAVASCULAR PRESSURE WIRE/FFR STUDY;  Surgeon: ENelva Bush MD;  [facility];  Service: Cardiovascular;  Laterality: N/A;   LEFT HEART CATH AND CORONARY ANGIOGRAPHY N/A 11/15/2020   Procedure: LEFT HEART CATH AND CORONARY ANGIOGRAPHY;  Surgeon: ENelva Bush MD;  [facility] [facility];  Service: Cardiovascular;  Laterality: N/A;   LEFT HEART CATHETERIZATION WITH CORONARY ANGIOGRAM N/A 04/14/2011   Procedure: LEFT HEART CATHETERIZATION WITH CORONARY ANGIOGRAM;  Surgeon: KPixie Casino MD;  [facility];  Service: Cardiovascular;  Laterality: N/A;   LEFT HEART CATHETERIZATION WITH CORONARY ANGIOGRAM N/A 06/25/2012   Procedure: LEFT HEART CATHETERIZATION WITH CORONARY ANGIOGRAM;  Surgeon: Peter M JMartinique MD;  [facility];  Service: Cardiovascular;  Laterality: N/A;   TEE WITHOUT CARDIOVERSION N/A 11/20/2020   Procedure: TRANSESOPHAGEAL ECHOCARDIOGRAM (TEE);  Surgeon: AWonda Olds  MD;  [facility];  Service: Open Heart Surgery;  Laterality: N/A;   TONSILLECTOMY AND ADENOIDECTOMY  1955    Home Medications:  Allergies as of 03/05/2022       Reactions   Hydroxyzine Hcl Other (See Comments)   Weakness, out of this world feeling. sluggishness   Latex Rash   I.e. Elastic= rash to blisters if worn for an extended time Patient show effects after long exposure.        Medication List        Accurate as of March 05, 2022  1:32 PM. If you have any questions, ask your nurse or doctor.           amoxicillin-clavulanate 875-125 MG tablet Commonly known as: AUGMENTIN Take 1 tablet by mouth every 12 (twelve) hours.   aspirin EC 325 MG tablet Take 1 tablet (325 mg total) by mouth daily.   atorvastatin 80 MG tablet Commonly known as: LIPITOR Take 1 tablet (80 mg total) by mouth daily.   blood glucose meter kit and supplies Kit Dispense based on patient and insurance preference. Use up to four times daily as directed.   carvedilol 6.25 MG tablet Commonly known as: COREG Take 1 tablet (6.25 mg total) by mouth 2 (two) times daily with a meal.   diphenhydramine-acetaminophen 25-500 MG Tabs tablet Commonly known as: TYLENOL PM Take 2 tablets by mouth at bedtime.   ezetimibe 10 MG tablet Commonly known as: ZETIA Take 1 tablet (10 mg total) by mouth daily.   furosemide 40 MG tablet Commonly known as: LASIX Take 1 tablet (40 mg total) by mouth daily.   insulin glargine 100 UNIT/ML Solostar Pen Commonly known as: LANTUS Inject 25 Units into the skin daily.   Insulin Pen Needle 31G X 5 MM Misc Inject insulin via insulin pen daily   isosorbide mononitrate 30 MG 24 hr tablet Commonly known as: IMDUR Take 1 tablet (30 mg total) by mouth daily.   losartan 25 MG tablet Commonly known as: COZAAR Take 1 tablet (25 mg total) by mouth daily.   metFORMIN 1000 MG tablet Commonly known as: GLUCOPHAGE TAKE 1 TABLET TWICE A DAY WITH MEALS   multivitamins ther. w/minerals Tabs tablet Take 1 tablet by mouth every morning.   niacin 1000 MG CR tablet Commonly known as: NIASPAN TAKE 2 TABLETS DAILY AT BEDTIME   potassium chloride SA 20 MEQ tablet Commonly known as: KLOR-CON M Take 1 tablet (20 mEq total) by mouth daily.   tamsulosin 0.4 MG Caps capsule Commonly known as: FLOMAX TAKE 1 CAPSULE DAILY AFTER SUPPER   Trulicity 3 YT/0.3TW Sopn Generic drug: Dulaglutide Inject 3 mg as directed once a week.        Allergies:  Allergies  Allergen Reactions   Hydroxyzine  Hcl Other (See Comments)    Weakness, out of this world feeling. sluggishness   Latex Rash    I.e. Elastic= rash to blisters if worn for an extended time Patient show effects after long exposure.    Family History: Family History  Problem Relation Age of Onset   COPD Mother    Other Father        muscle tumor   Stroke Father    Prostate cancer Father        mets   Heart disease Father    Alcoholism Father    Stroke Sister    Heart block Sister        Heart bypass   Bladder Cancer Neg Hx  Kidney cancer Neg Hx     Social History:  reports that he quit smoking about 14 years ago. His smoking use included cigarettes. He has a 60.00 pack-year smoking history. He has never used smokeless tobacco. He reports that he does not drink alcohol and does not use drugs.   Physical Exam: There were no vitals taken for this visit.  Constitutional:  Alert and oriented, No acute distress. HEENT: [place] AT, moist mucus membranes.  Trachea midline, no masses. Cardiovascular: No clubbing, cyanosis, or edema. Respiratory: Normal respiratory effort, no increased work of breathing. GI: Abdomen is soft, nontender, nondistended, no abdominal masses GU: No CVA tenderness Skin: No rashes, bruises or suspicious lesions. Neurologic: Grossly intact, no focal deficits, moving all 4 extremities. Psychiatric: Normal mood and affect.  Laboratory Data: Lab Results  Component Value Date   WBC 4.6 12/02/2021   HGB 12.4 (L) 12/02/2021   HCT 35.8 (L) 12/02/2021   MCV 93.5 12/02/2021   PLT 140.0 (L) 12/02/2021    Lab Results  Component Value Date   CREATININE 0.85 12/31/2021    Lab Results  Component Value Date   PSA 7.37 (H) 07/02/2021   PSA 1.24 09/01/2013    No results found for: "TESTOSTERONE"  Lab Results  Component Value Date   HGBA1C 8.3 (H) 12/31/2021    Urinalysis    Component Value Date/Time   COLORURINE AMBER (A) 10/22/2021 0020   APPEARANCEUR Cloudy (A) 12/12/2021 1438    LABSPEC 1.021 10/22/2021 0020   PHURINE 6.0 10/22/2021 0020   GLUCOSEU 1+ (A) 12/12/2021 1438   GLUCOSEU NEGATIVE 09/01/2013 0942   HGBUR NEGATIVE 10/22/2021 0020   BILIRUBINUR Negative 12/12/2021 1438   KETONESUR NEGATIVE 10/22/2021 0020   PROTEINUR 1+ (A) 12/12/2021 1438   PROTEINUR 30 (A) 10/22/2021 0020   UROBILINOGEN 0.2 12/02/2021 1138   UROBILINOGEN 0.2 09/01/2013 0942   NITRITE Positive (A) 12/12/2021 1438   NITRITE POSITIVE (A) 10/22/2021 0020   LEUKOCYTESUR 3+ (A) 12/12/2021 1438   LEUKOCYTESUR LARGE (A) 10/22/2021 0020    Lab Results  Component Value Date   LABMICR See below: 12/12/2021   WBCUA >30 (A) 12/12/2021   RBCUA None seen 08/28/2016   LABEPIT 0-10 12/12/2021   MUCUS Presence of (A) 12/02/2021   BACTERIA Many (A) 12/12/2021    Pertinent Imaging: *** Results for orders placed during the hospital encounter of 05/22/18  DG Abdomen 1 View  Narrative CLINICAL DATA:  Initial evaluation for acute right flank pain.  EXAM: ABDOMEN - 1 VIEW  COMPARISON:  Prior CT from 08/25/2017.  FINDINGS: Bowel gas pattern within normal limits without evidence for obstruction or ileus. Large volume retained stool within the colon, suggesting constipation. There is a 4 mm calcific density seen just to the right of the L1 vertebral body, indeterminate, but could reflect a right renal calculus. No other abnormal calcification. No soft tissue mass. Visualized lung bases are clear.  Scattered degenerative changes noted within the visualized spine and about the hips bilaterally.  IMPRESSION: 1. 4 mm calcific density to the right of the L1 vertebral body, indeterminate, but could reflect a right renal calculus. 2. Large volume retained stool within the colon, suggesting constipation.   Electronically Signed By: Jeannine Boga M.D. On: 05/22/2018 19:11  No results found for this or any previous visit.  No results found for this or any previous visit.  No  results found for this or any previous visit.  No results found for this or any previous visit.  No valid procedures specified. Results for orders placed during the hospital encounter of 12/17/21  CT HEMATURIA WORKUP  Narrative CLINICAL DATA:  71 year old male with history of microscopic hematuria. History of prostate cancer. * Tracking Code: BO *  EXAM: CT ABDOMEN AND PELVIS WITHOUT AND WITH CONTRAST  TECHNIQUE: Multidetector CT imaging of the abdomen and pelvis was performed following the standard protocol before and following the bolus administration of intravenous contrast.  RADIATION DOSE REDUCTION: This exam was performed according to the departmental dose-optimization program which includes automated exposure control, adjustment of the mA and/or kV according to patient size and/or use of iterative reconstruction technique.  CONTRAST:  177m OMNIPAQUE IOHEXOL 300 MG/ML  SOLN  COMPARISON:  CT the abdomen and pelvis 10/21/2021.  FINDINGS: Lower chest: Atherosclerotic calcifications are noted in the left anterior descending, left circumflex and right coronary arteries.  Hepatobiliary: Subcentimeter low-attenuation lesion in segment 2 of the liver, too small to characterize, but statistically likely a tiny cyst or biliary hamartoma. No other suspicious cystic or solid hepatic lesions. No intra or extrahepatic biliary ductal dilatation. Gallbladder is nearly completely decompressed, but otherwise unremarkable in appearance.  Pancreas: No pancreatic mass. No pancreatic ductal dilatation. No pancreatic or peripancreatic fluid collections or inflammatory changes.  Spleen: Unremarkable.  Adrenals/Urinary Tract: A few tiny nonobstructive calculi are noted within the collecting systems of both kidneys, measuring up to 4 mm in the lower pole collecting system of the right kidney. No additional calculi are noted along the course of either ureter, or within the lumen of the  urinary bladder. No hydroureteronephrosis. Extrarenal pelvis on the right (normal anatomical variant) incidentally noted. Postcontrast images demonstrate no suspicious enhancing renal lesions. Postcontrast delayed images demonstrate no filling defect within the collecting system of either kidney, along the well opacified portions of either ureter, or within the lumen of the urinary bladder to strongly suggest the presence of urothelial neoplasm at this time. Urinary bladder is normal in appearance. Right adrenal gland is normal in appearance. 2.5 x 2.2 cm low-attenuation (5 HU) left adrenal nodule in the lateral limb of the left adrenal gland, compatible with a benign adenoma.  Stomach/Bowel: Normal appearance of the stomach. No pathologic dilatation of small bowel or colon. Multiple colonic diverticulae are noted, without surrounding inflammatory changes to indicate an acute diverticulitis at this time. Normal appendix.  Vascular/Lymphatic: Atherosclerotic calcifications in the abdominal aorta and pelvic vasculature. No lymphadenopathy noted in the abdomen or pelvis.  Reproductive: Prostate gland and seminal vesicles are unremarkable in appearance.  Other: No significant volume of ascites.  No pneumoperitoneum.  Musculoskeletal: There are no aggressive appearing lytic or blastic lesions noted in the visualized portions of the skeleton.  IMPRESSION: 1. Tiny nonobstructive calculi in the collecting systems of both kidneys measuring up to 4 mm in the lower pole collecting system of the right kidney. No ureteral stones or findings of urinary tract obstruction are noted at this time. 2. Extrarenal pelvis in the right kidney (normal anatomical variant) incidentally noted. 3. Small left adrenal adenoma, similar to prior studies. 4. Colonic diverticulosis without evidence of acute diverticulitis at this time. 5. Aortic atherosclerosis, in addition to at least three-vessel coronary  artery disease. Assessment for potential risk factor modification, dietary therapy or pharmacologic therapy may be warranted, if clinically indicated. 6. Additional incidental findings, as above.   Electronically Signed By: DVinnie LangtonM.D. On: 12/18/2021 07:40  Results for orders placed during the hospital encounter of 10/21/21  CT Renal Stone Study  Narrative  CLINICAL DATA:  Left flank pain  EXAM: CT ABDOMEN AND PELVIS WITHOUT CONTRAST  TECHNIQUE: Multidetector CT imaging of the abdomen and pelvis was performed following the standard protocol without IV contrast.  RADIATION DOSE REDUCTION: This exam was performed according to the departmental dose-optimization program which includes automated exposure control, adjustment of the mA and/or kV according to patient size and/or use of iterative reconstruction technique.  COMPARISON:  08/25/2017  FINDINGS: Lower chest: Coronary artery calcifications.  No acute abnormality.  Hepatobiliary: No focal hepatic abnormality. Gallbladder unremarkable.  Pancreas: No focal abnormality or ductal dilatation.  Spleen: No focal abnormality.  Normal size.  Adrenals/Urinary Tract: 2.3 cm low-density left adrenal nodule again noted, unchanged since prior study most compatible with adenoma. Right adrenal gland normal. Mild left hydronephrosis and extensive perinephric stranding around the left kidney. Left ureter mildly dilated to the bladder. No visible obstructing stones. Right hydronephrosis noted. Right ureter decompressed. 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 Punctate bilateral nonobstructing renal stones. Urinary bladder unremarkable.  Stomach/Bowel: Sigmoid diverticulosis. No active diverticulitis. Stomach and small bowel decompressed, unremarkable. Normal appendix.  Vascular/Lymphatic: Aortic atherosclerosis. No evidence of aneurysm or adenopathy.  Reproductive: No visible focal  abnormality.  Other: No free fluid or free air. Small left inguinal hernia containing fat.  Musculoskeletal: No acute bony abnormality.  IMPRESSION: Moderate left hydroureteronephrosis with extensive perinephric stranding. No visible obstructing stones.  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  Bilateral nephrolithiasis.  Sigmoid diverticulosis.  Aortic atherosclerosis.   Electronically Signed By: Rolm Baptise M.D. On: 10/21/2021 23:46   Assessment & Plan:    There are no diagnoses linked to this encounter.  No follow-ups on file.  Hollice Espy, MD  [facility] [address], [place], [place] [address] [phone]

## 2022-03-05 NOTE — Progress Notes (Signed)
Radiation Oncology Follow up Note  Name: [name]   Date:   03/05/2022 MRN:  [id number] DOB: 07-16-1950    This 71 y.o. male presents to the clinic today for 64-monthfollow-up status post image guided IMRT radiation therapy to his prostate for Gleason 7 (4+3) adenocarcinoma prostate presenting with a PSA of 7.4.  REFERRING PROVIDER: SLeone Haven MD  HPI: Patient is a 71year old male now out for months having completed image guided IMRT radiation to his prostate for Gleason 7 (4+3) adenocarcinoma seen today in routine follow-up he is finally getting some normal normalcy to his lower urinary tract symptoms.  That is improved greatly.  He is also having no bowel complaints at this time.  His PSA is now less than 0.1 he is currently on ADT therapy and is scheduled to have another injection this week..Marland Kitchen He did have some hematuria had a CT hematuria work-up showing nonobstructive calculi in the collecting systems of both kidneys.  COMPLICATIONS OF TREATMENT: none  FOLLOW UP COMPLIANCE: keeps appointments   PHYSICAL EXAM:  BP 138/80 (BP Location: Right Arm, Patient Position: Sitting, Cuff Size: Normal)   Pulse 88   Temp 97.6 F (36.4 C) (Tympanic)   Resp 16   Wt 197 lb 9.6 oz (89.6 kg)   BMI 30.04 kg/m  Well-developed well-nourished patient in NAD. HEENT reveals PERLA, EOMI, discs not visualized.  Oral cavity is clear. No oral mucosal lesions are identified. Neck is clear without evidence of cervical or supraclavicular adenopathy. Lungs are clear to A&P. Cardiac examination is essentially unremarkable with regular rate and rhythm without murmur rub or thrill. Abdomen is benign with no organomegaly or masses noted. Motor sensory and DTR levels are equal and symmetric in the upper and lower extremities. Cranial nerves II through XII are grossly intact. Proprioception is intact. No peripheral adenopathy or edema is identified. No motor or sensory levels are noted. Crude visual  fields are within normal range.  RADIOLOGY RESULTS: CT scan for hematuria reviewed  PLAN: Present time patient is doing well under excellent biochemical control of his prostate cancer.  I am pleased with his overall progress have asked to see him back in 6 months with a follow-up PSA.  Patient knows to call with any concerns.  I would like to take this opportunity to thank you for allowing me to participate in the care of your patient..Noreene Filbert MD
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
## 2022-03-10 ENCOUNTER — Ambulatory Visit: Payer: Medicare Other | Attending: Cardiovascular Disease | Admitting: Cardiovascular Disease

## 2022-03-10 ENCOUNTER — Encounter: Payer: Self-pay | Admitting: Cardiovascular Disease

## 2022-03-10 VITALS — BP 130/62 | HR 93 | Ht 68.0 in | Wt 194.1 lb

## 2022-03-10 DIAGNOSIS — Z951 Presence of aortocoronary bypass graft: Secondary | ICD-10-CM | POA: Insufficient documentation

## 2022-03-10 DIAGNOSIS — I10 Essential (primary) hypertension: Secondary | ICD-10-CM | POA: Insufficient documentation

## 2022-03-10 DIAGNOSIS — E7800 Pure hypercholesterolemia, unspecified: Secondary | ICD-10-CM | POA: Diagnosis not present

## 2022-03-10 DIAGNOSIS — E785 Hyperlipidemia, unspecified: Secondary | ICD-10-CM | POA: Insufficient documentation

## 2022-03-10 DIAGNOSIS — I25119 Atherosclerotic heart disease of native coronary artery with unspecified angina pectoris: Secondary | ICD-10-CM | POA: Diagnosis not present

## 2022-03-10 DIAGNOSIS — I493 Ventricular premature depolarization: Secondary | ICD-10-CM | POA: Diagnosis not present

## 2022-03-10 DIAGNOSIS — G4733 Obstructive sleep apnea (adult) (pediatric): Secondary | ICD-10-CM | POA: Diagnosis not present

## 2022-03-10 DIAGNOSIS — I25118 Atherosclerotic heart disease of native coronary artery with other forms of angina pectoris: Secondary | ICD-10-CM | POA: Insufficient documentation

## 2022-03-10 DIAGNOSIS — I255 Ischemic cardiomyopathy: Secondary | ICD-10-CM | POA: Insufficient documentation

## 2022-03-10 MED ORDER — EZETIMIBE 10 MG PO TABS: 10.0000 mg | ORAL_TABLET | Freq: Every day | ORAL | 3 refills | Status: DC

## 2022-03-10 MED ORDER — ISOSORBIDE MONONITRATE ER 30 MG PO TB24: 30.0000 mg | ORAL_TABLET | Freq: Every day | ORAL | 3 refills | Status: DC

## 2022-03-10 MED ORDER — FUROSEMIDE 40 MG PO TABS: 40.0000 mg | ORAL_TABLET | Freq: Every day | ORAL | 3 refills | Status: DC

## 2022-03-10 MED ORDER — LOSARTAN POTASSIUM 25 MG PO TABS: 25.0000 mg | ORAL_TABLET | Freq: Every day | ORAL | 3 refills | Status: DC

## 2022-03-10 MED ORDER — CARVEDILOL 6.25 MG PO TABS: 6.2500 mg | ORAL_TABLET | Freq: Two times a day (BID) | ORAL | 3 refills | Status: DC

## 2022-03-10 MED ORDER — POTASSIUM CHLORIDE CRYS ER 20 MEQ PO TBCR: 20.0000 meq | EXTENDED_RELEASE_TABLET | Freq: Every day | ORAL | 3 refills | Status: DC

## 2022-03-10 MED ORDER — ATORVASTATIN CALCIUM 80 MG PO TABS: 80.0000 mg | ORAL_TABLET | Freq: Every day | ORAL | 3 refills | Status: DC

## 2022-03-10 MED ORDER — NIACIN ER (ANTIHYPERLIPIDEMIC) 1000 MG PO TBCR: EXTENDED_RELEASE_TABLET | ORAL | 3 refills | Status: DC

## 2022-03-10 NOTE — Patient Instructions (Addendum)
Medication Instructions:  No changes  If you need a refill on your cardiac medications before your next appointment, please call your pharmacy.   Lab work: No new labs needed  Testing/Procedures: No new testing needed  Follow-Up: At [facility], you and your health needs are our priority.  As part of our continuing mission to provide you with exceptional heart care, we have created designated Provider Care Teams.  These Care Teams include your primary Cardiologist (physician) and Advanced Practice Providers (APPs -  Physician Assistants and Nurse Practitioners) who all work together to provide you with the care you need, when you need it.  You will need a follow up appointment in 12 months  Providers on your designated Care Team:   Christopher Berge, NP Ryan Dunn, PA-C Cadence Furth, PA-C  COVID-19 Vaccine Information can be found at: [email or web address] For questions related to vaccine distribution or appointments, please email [email or web address] or call [phone].   

## 2022-03-20 ENCOUNTER — Telehealth: Payer: Self-pay | Admitting: Family Medicine

## 2022-03-20 DIAGNOSIS — H903 Sensorineural hearing loss, bilateral: Secondary | ICD-10-CM

## 2022-03-20 NOTE — Telephone Encounter (Signed)
Patient is requesting a referral for Audiology.

## 2022-03-20 NOTE — Telephone Encounter (Signed)
Referral placed.

## 2022-03-20 NOTE — Addendum Note (Signed)
Addended by: Caryl Bis, [place] Kirt G on: 03/20/2022 11:15 AM   Modules accepted: Orders

## 2022-03-25 ENCOUNTER — Ambulatory Visit: Payer: Medicare Other | Attending: Audiologist | Admitting: Audiologist

## 2022-03-25 ENCOUNTER — Telehealth: Payer: Self-pay | Admitting: Family Medicine

## 2022-03-25 ENCOUNTER — Ambulatory Visit (INDEPENDENT_AMBULATORY_CARE_PROVIDER_SITE_OTHER): Payer: Medicare Other | Admitting: Dermatology

## 2022-03-25 ENCOUNTER — Encounter: Payer: Self-pay | Admitting: Dermatology

## 2022-03-25 DIAGNOSIS — H6993 Unspecified Eustachian tube disorder, bilateral: Secondary | ICD-10-CM | POA: Insufficient documentation

## 2022-03-25 DIAGNOSIS — B079 Viral wart, unspecified: Secondary | ICD-10-CM | POA: Diagnosis not present

## 2022-03-25 DIAGNOSIS — H906 Mixed conductive and sensorineural hearing loss, bilateral: Secondary | ICD-10-CM | POA: Diagnosis not present

## 2022-03-25 NOTE — Telephone Encounter (Signed)
-----   Message from Donalee Citrin, AUD sent at 03/25/2022  9:06 AM EST ----- Dr. Netty Starring has eustachian tube dysfunction causing moderate mixed hearing loss. Recommended a OTC decongestant, and if that does not help to have him schedule with you for further treatment. Once pressure and middle ear dysfunction is resolved, recommend hearing re-test to make sure hearing returns to being symmetric.  Alfonse Alpers Au.D.  Audiologist

## 2022-03-25 NOTE — Procedures (Signed)
  Outpatient Audiology and [place], [place]  [id number] [phone]  AUDIOLOGICAL  EVALUATION  NAME: [name]     DOB:   [birth year]      MRN: [id number]                                                                                     DATE: 03/25/2022     REFERENT: [name], MD STATUS: Outpatient DIAGNOSIS: Mixed Hearing Loss     History: [name] was seen for an audiological evaluation.  [name] is receiving a hearing evaluation due to concerns for difficulty hearing ever since he was congested. [name] has difficulty hearing in both ears, but mostly the left. This difficulty began suddenly after [name] was sick. He feels like is hears are clogged up full of pressure. No pain just pressure with the left ear much worse.  Tinnitus present for many years in both ears. [name] has a history of noise exposure from being in the [employer].  Medical history of diabetes which is a risk factor for hearing loss. No other relevant case history reported.   Evaluation:  Otoscopy showed a clear view of the tympanic membranes with both visibly retracted but no erythema , bilaterally Tympanometry results were consistent with abnormal middle ear function, right ear has negative pressure and left ear has flat response, EFT showed some movement in left ear with valsalva.  Audiometric testing was completed using conventional audiometry with insert transducer. Speech Recognition Thresholds were 25dB in the right ear and 40dB in the left ear. Word Recognition was performed 40dB SL, scored 100% in the right ear and 100% in the left ear. Pure tone thresholds show mixed mild sloping to moderate hearing loss in the right ear and mixed mild sloping to severe hearing loss in the left ear.   Results:  The test results were reviewed with [name]. He has hearing loss in each ear that has a conductive component due to his middle dysfunction. This is likely the cause of  the sudden change in hearing. His middle ear can be forced to function with Valsalva showing the conductive component is likely due to dysfunction in the eustachian tubes.   Recommendations: Try OTC decongestant spray for eustachian tube dysfunction. If this does not help then see PCP.  Return for repeat audiologic evaluation once congestion clears to make sure asymmetric conductive hearing loss resolved.    43 minutes spent testing and counseling on results.   Alfonse Alpers  Audiologist, Au.D., CCC-A 03/25/2022  8:42 AM  Cc: [name], MD

## 2022-03-25 NOTE — Patient Instructions (Signed)
Instructions for After In-Office Application of Cantharidin  1. This is a strong medicine; please follow ALL instructions.  2. Gently wash off with soap and water in four hours or sooner s directed by your physician.  3. **WARNING** this medicine can cause severe blistering, blood blisters, infection, and/or scarring if it is not washed off as directed.  4. Your progress will be rechecked in 1-2 months; call sooner if there are any questions or problems.  Cantharidin Plus is a blistering agent that comes from a beetle.  It needs to be washed off in about 4 hours after application.  Although it is painless when applied in office, it may cause symptoms of mild pain and burning several hours later.  Treated areas will swell and turn red, and blisters may form.  Vaseline and a bandaid may be applied until wound has healed.  Once healed, the skin may remain temporarily discolored.  It can take weeks to months for pigmentation to return to normal.  Advised to wash off with soap and water in 4 hours or sooner if it becomes tender before then.  Due to recent changes in healthcare laws, you may see results of your pathology and/or laboratory studies on MyChart before the doctors have had a chance to review them. We understand that in some cases there may be results that are confusing or concerning to you. Please understand that not all results are received at the same time and often the doctors may need to interpret multiple results in order to provide you with the best plan of care or course of treatment. Therefore, we ask that you please give [place] 2 business days to thoroughly review all your results before contacting the office for clarification. Should we see a critical lab result, you will be contacted sooner.   If You Need Anything After Your Visit  If you have any questions or concerns for your doctor, please call our main line at [phone] and press option 4 to reach your doctor's medical assistant.  If no one answers, please leave a voicemail as directed and we will return your call as soon as possible. Messages left after 4 pm will be answered the following business day.   You may also send [place] a message via [place]. We typically respond to MyChart messages within 1-2 business days.  For prescription refills, please ask your pharmacy to contact our office. Our fax number is [phone].  If you have an urgent issue when the clinic is closed that cannot wait until the next business day, you can page your doctor at the number below.    Please note that while we do our best to be available for urgent issues outside of office hours, we are not available 24/7.   If you have an urgent issue and are unable to reach [place], you may choose to seek medical care at your doctor's office, retail clinic, urgent care center, or emergency room.  If you have a medical emergency, please immediately call 911 or go to the emergency department.  Pager Numbers  - Dr. Nehemiah Massed: [phone]  - Dr. Laurence Ferrari: [phone]  - Dr. Nicole Kindred: [phone]  In the event of inclement weather, please call our main line at [phone] for an update on the status of any delays or closures.  Dermatology Medication Tips: Please keep the boxes that topical medications come in in order to help keep track of the instructions about where and how to use these. Pharmacies typically print the medication instructions  only on the boxes and not directly on the medication tubes.   If your medication is too expensive, please contact our office at [phone] option 4 or send [place] a message through [place].   We are unable to tell what your co-pay for medications will be in advance as this is different depending on your insurance coverage. However, we may be able to find a substitute medication at lower cost or fill out paperwork to get insurance to cover a needed medication.   If a prior authorization is required to get your medication  covered by your insurance company, please allow [place] 1-2 business days to complete this process.  Drug prices often vary depending on where the prescription is filled and some pharmacies may offer cheaper prices.  The website www.goodrx.com contains coupons for medications through different pharmacies. The prices here do not account for what the cost may be with help from insurance (it may be cheaper with your insurance), but the website can give you the price if you did not use any insurance.  - You can print the associated coupon and take it with your prescription to the pharmacy.  - You may also stop by our office during regular business hours and pick up a GoodRx coupon card.  - If you need your prescription sent electronically to a different pharmacy, notify our office through [facility] or by phone at [phone] option 4.     Si Usted Necesita Algo Despus de Su Visita  Tambin puede enviarnos un mensaje a travs de Pharmacist, community. Por lo general respondemos a los mensajes de MyChart en el transcurso de 1 a 2 das hbiles.  Para renovar recetas, por favor pida a su farmacia que se ponga en contacto con nuestra oficina. Harland Dingwall de fax es [place] [phone].  Si tiene un asunto urgente cuando la clnica est cerrada y que no puede esperar hasta el siguiente da hbil, puede llamar/localizar a su doctor(a) al nmero que aparece a continuacin.   Por favor, tenga en cuenta que aunque hacemos todo lo posible para estar disponibles para asuntos urgentes fuera del horario de [place], no estamos disponibles las 24 horas del da, los 7 das de la [place].   Si tiene un problema urgente y no puede comunicarse con nosotros, puede optar por buscar atencin mdica  en el consultorio de su doctor(a), en una clnica privada, en un centro de atencin urgente o en una sala de emergencias.  Si tiene Engineering geologist, por favor llame inmediatamente al 911 o vaya a la sala de  emergencias.  Nmeros de bper  - Dr. Nehemiah Massed: [phone]  - Dra. Moye: [phone]  - Dra. Nicole Kindred: [phone]  En caso de inclemencias del [place], por favor llame a Johnsie Kindred principal al [phone] para una actualizacin sobre el [place] de cualquier retraso o cierre.  Consejos para la medicacin en dermatologa: Por favor, guarde las cajas en las que vienen los medicamentos de uso tpico para ayudarle a seguir las instrucciones sobre dnde y cmo usarlos. Las farmacias generalmente imprimen las instrucciones del medicamento slo en las cajas y no directamente en los tubos del [place].   Si su medicamento es muy caro, por favor, pngase en contacto con Zigmund Daniel llamando al [phone] y presione la opcin 4 o envenos un mensaje a travs de Pharmacist, community.   No podemos decirle cul ser su copago por los medicamentos por adelantado ya que esto es diferente dependiendo de la cobertura de su seguro. Sin embargo, es  posible que podamos encontrar un medicamento sustituto a Electrical engineer un formulario para que el seguro cubra el medicamento que se considera necesario.   Si se requiere una autorizacin previa para que su compaa de seguros [place] su medicamento, por favor permtanos de 1 a 2 das hbiles para completar este proceso.  Los precios de los medicamentos varan con frecuencia dependiendo del Environmental consultant de dnde se surte la receta y alguna farmacias pueden ofrecer precios ms baratos.  El sitio web www.goodrx.com tiene cupones para medicamentos de Airline pilot. Los precios aqu no tienen en cuenta lo que podra costar con la ayuda del seguro (puede ser ms barato con su seguro), pero el sitio web puede darle el precio si no utiliz Research scientist (physical sciences).  - Puede imprimir el cupn correspondiente y llevarlo con su receta a la farmacia.  - Tambin puede pasar por nuestra oficina durante el horario de atencin regular y Charity fundraiser una tarjeta de cupones de GoodRx.  - Si  necesita que su receta se enve electrnicamente a una farmacia diferente, informe a nuestra oficina a travs de MyChart de [place] o por telfono llamando al [phone] y presione la opcin 4.

## 2022-03-25 NOTE — Progress Notes (Signed)
   Follow-Up Visit   Subjective  [name] is a 71 y.o. male who presents for the following: Warts (Patient here today for wart follow up at right thumb. Previously treated with LN2, cantharidin plus and squaric acid. ).  Patient accompanied by wife today who contributes to history.   The following portions of the chart were reviewed this encounter and updated as appropriate:   Tobacco  Allergies  Meds  Problems  Med Hx  Surg Hx  Fam Hx      Review of Systems:  No other skin or systemic complaints except as noted in HPI or Assessment and Plan.  Objective  Well appearing patient in no apparent distress; mood and affect are within normal limits.  A focused examination was performed including right hand. Relevant physical exam findings are noted in the Assessment and Plan.  right thumb Verrucous papules -- Discussed viral etiology and contagion.     Assessment & Plan  Viral warts, unspecified type right thumb  Bx proven  Squaric Acid 3% applied to warts today. Prior to application reviewed risk of inflammation and irritation.  Prior to procedure, discussed risks of blister formation, small wound, skin dyspigmentation, or rare scar following cryotherapy. Recommend Vaseline ointment to treated areas while healing.  Cantharidin Plus is a blistering agent that comes from a beetle.  It needs to be washed off in about 4 hours after application.  Although it is painless when applied in office, it may cause symptoms of mild pain and burning several hours later.  Treated areas will swell and turn red, and blisters may form.  Vaseline and a bandaid may be applied until wound has healed.  Once healed, the skin may remain temporarily discolored.  It can take weeks to months for pigmentation to return to normal.  Advised to wash off with soap and water in 4 hours or sooner if it becomes tender before then.   Destruction of lesion - right thumb  Destruction method: cryotherapy    Informed consent: discussed and consent obtained   Lesion destroyed using liquid nitrogen: Yes   Cryotherapy cycles:  2 Outcome: patient tolerated procedure well with no complications   Post-procedure details: wound care instructions given    Destruction of lesion - right thumb  Destruction method: chemical removal   Informed consent: discussed and consent obtained   Timeout:  patient name, date of birth, surgical site, and procedure verified Chemical destruction method: cantharidin   Chemical destruction method comment:  Cantharidin plus, squaric acid 3% Application time:  4 hours Procedure instructions: patient instructed to wash and dry area   Outcome: patient tolerated procedure well with no complications   Post-procedure details: wound care instructions given   Additional details:  Patient advised to set alarm to remind them to wash off with soap and water at the directed time.   Return in about 4 weeks (around 04/22/2022) for Warts.  Graciella Belton, RMA, am acting as scribe for Forest Gleason, MD .  Documentation: I have reviewed the above documentation for accuracy and completeness, and I agree with the above.  Forest Gleason, MD

## 2022-03-25 NOTE — Telephone Encounter (Signed)
Can you call the patient to follow-up on his audiology evaluation and see what decongestant he got over the counter? With his cardiac history he should avoid sudafed and phenylephrine. It may be best for his to trial flonase nose spray first to see if that is beneficial.

## 2022-03-26 ENCOUNTER — Encounter: Payer: Self-pay | Admitting: Urology

## 2022-03-26 ENCOUNTER — Ambulatory Visit (INDEPENDENT_AMBULATORY_CARE_PROVIDER_SITE_OTHER): Payer: Medicare Other | Admitting: Urology

## 2022-03-26 VITALS — BP 132/78 | HR 92 | Ht 68.0 in | Wt 194.0 lb

## 2022-03-26 DIAGNOSIS — N135 Crossing vessel and stricture of ureter without hydronephrosis: Secondary | ICD-10-CM | POA: Diagnosis not present

## 2022-03-26 DIAGNOSIS — C61 Malignant neoplasm of prostate: Secondary | ICD-10-CM

## 2022-03-26 DIAGNOSIS — I255 Ischemic cardiomyopathy: Secondary | ICD-10-CM

## 2022-03-26 NOTE — Progress Notes (Signed)
03/26/2022 3:28 PM   [name] Oct 03, 1950 [id number]  Referring provider: [name], MD [place],  [place] [address]  Chief Complaint  Patient presents with   Elevated PSA    HPI: 71 year old male with presents for prostate cancer, right UPJ obstruction, kidney stones returns to the office for 42-monthfollow-up.  He underwent a prostate biopsy on 07/18/2021. TRUS 53.6 gm. Surgical pathology was consistent with Gleason 4+3 involving 5 cores affecting up to 100%. He is currently on ADT x6 months. He is S/p Gold seed implant in 08/2021.    He has a known low-grade right UPJ obstruction.  This is managed conservatively.  Please see previous notes for details.  He also has bilateral nonobstructing stones up to 4 mm which are asymptomatic.  PSA remains undetectable as of 02/25/2022.  Overall he is feeling well.  He still taking Flomax but his urinary symptoms have resolved.    PMH: Past Medical History:  Diagnosis Date   Actinic keratoses    Angina    Asthma    BCC (basal cell carcinoma of skin) 09/06/2020   left upper back lateral (EDC) ,  left upper arm anterior (EDC), left upper back medial (EDC)    Bursitis of left elbow 2018   Diabetes mellitus without complication ([place]    ED (erectile dysfunction)    HTN (hypertension) 04/12/2011   Hyperlipemia 04/12/2011   Ischemic cardiomyopathy    a. echo 2010: EF 45-50%, mild to mod ant and apical wall HK, trace MR; b. cardiac cath 06/2012: EF 40%, mild MR    Multiple vessel coronary artery disease 04/12/2011   a. remote MI 1996; b. MI 1997 s/p PCI - LAD & diag; c. MI 2009: long stenting P-MLAD & Diag & POBA of jailed diag branch; d. cath 2010: 80% ISR of LAD w/ L-R collats, med Rx; e. cath 2012: 50-60% tub mLAD, 60-70% dLAD, FFR 0.75 -->s/p PCI dissection => w/ 2 stents (3 total); f. cath 06/2012: no changes from 2012 cath, med Rx, patent stents -> MV CAD 11/20/20 --> CABG X 5   Myocardial  infarction (HCC)    x 5   Obesity    OSA (obstructive sleep apnea)    on CPAP   Osteoarthritis    Renal disorder    kidney stone    Surgical History: Past Surgical History:  Procedure Laterality Date   CORONARY ANGIOPLASTY     x 5   CORONARY ARTERY BYPASS GRAFT N/A 11/20/2020   Procedure: CORONARY ARTERY BYPASS GRAFTING (CABG) x  FIVE (LIMA-dLAD, SVG-PDA, SVG-D1, SeqSVG-OM1-2) ON PUMP  USING LEFT INTERNAL MAMMARY ARTERTY AND LEFT ENDOSCOPIC GREATER SAPHENOUS VEIN CONDUITS, RIGHT LEG OPENED NOT HARVESTED;  Surgeon: AWonda Olds MD;  [facility]  Service: Open Heart Surgery;  Laterality: N/A;   CORONARY STENT PLACEMENT     x 7   INTRAVASCULAR PRESSURE WIRE/FFR STUDY N/A 11/15/2020   Procedure: INTRAVASCULAR PRESSURE WIRE/FFR STUDY;  Surgeon: ENelva Bush MD;  [facility];  Service: Cardiovascular;  Laterality: N/A;   LEFT HEART CATH AND CORONARY ANGIOGRAPHY N/A 11/15/2020   Procedure: LEFT HEART CATH AND CORONARY ANGIOGRAPHY;  Surgeon: ENelva Bush MD;  [facility];  Service: Cardiovascular;  Laterality: N/A;   LEFT HEART CATHETERIZATION WITH CORONARY ANGIOGRAM N/A 04/14/2011   Procedure: LEFT HEART CATHETERIZATION WITH CORONARY ANGIOGRAM;  Surgeon: KPixie Casino MD;  [facility];  Service: Cardiovascular;  Laterality: N/A;  LEFT HEART CATHETERIZATION WITH CORONARY ANGIOGRAM N/A 06/25/2012   Procedure: LEFT HEART CATHETERIZATION WITH CORONARY ANGIOGRAM;  Surgeon: Peter M Martinique, MD;  Location[facility];  Service: Cardiovascular;  Laterality: N/A;   TEE WITHOUT CARDIOVERSION N/A 11/20/2020   Procedure: TRANSESOPHAGEAL ECHOCARDIOGRAM (TEE);  Surgeon: Wonda Olds, MD;  Location: [place];  Service: Open Heart Surgery;  Laterality: N/A;   TONSILLECTOMY AND ADENOIDECTOMY  1955    Home Medications:  Allergies as of 03/26/2022       Reactions   Hydroxyzine Hcl Other (See Comments)   Weakness, out of this world  feeling. sluggishness   Latex Rash   I.e. Elastic= rash to blisters if worn for an extended time Patient show effects after long exposure.        Medication List        Accurate as of March 26, 2022 11:59 PM. If you have any questions, ask your nurse or doctor.          STOP taking these medications    amoxicillin-clavulanate 875-125 MG tablet Commonly known as: AUGMENTIN Stopped by: Hollice Espy, MD   tamsulosin 0.4 MG Caps capsule Commonly known as: FLOMAX Stopped by: Hollice Espy, MD       TAKE these medications    aspirin EC 325 MG tablet Take 1 tablet (325 mg total) by mouth daily.   atorvastatin 80 MG tablet Commonly known as: LIPITOR Take 1 tablet (80 mg total) by mouth daily.   blood glucose meter kit and supplies Kit Dispense based on patient and insurance preference. Use up to four times daily as directed.   carvedilol 6.25 MG tablet Commonly known as: COREG Take 1 tablet (6.25 mg total) by mouth 2 (two) times daily with a meal.   diphenhydramine-acetaminophen 25-500 MG Tabs tablet Commonly known as: TYLENOL PM Take 2 tablets by mouth at bedtime.   ezetimibe 10 MG tablet Commonly known as: ZETIA Take 1 tablet (10 mg total) by mouth daily.   furosemide 40 MG tablet Commonly known as: LASIX Take 1 tablet (40 mg total) by mouth daily.   insulin glargine 100 UNIT/ML Solostar Pen Commonly known as: LANTUS Inject 25 Units into the skin daily.   Insulin Pen Needle 31G X 5 MM Misc Inject insulin via insulin pen daily   isosorbide mononitrate 30 MG 24 hr tablet Commonly known as: IMDUR Take 1 tablet (30 mg total) by mouth daily.   losartan 25 MG tablet Commonly known as: COZAAR Take 1 tablet (25 mg total) by mouth daily.   metFORMIN 1000 MG tablet Commonly known as: GLUCOPHAGE TAKE 1 TABLET TWICE A DAY WITH MEALS   multivitamins ther. w/minerals Tabs tablet Take 1 tablet by mouth every morning.   niacin 1000 MG CR  tablet Commonly known as: NIASPAN Take 2 tablets (2000 mg) by mouth once daily at bedtime   potassium chloride SA 20 MEQ tablet Commonly known as: KLOR-CON M Take 1 tablet (20 mEq total) by mouth daily.   Trulicity 3 YN/8.2NF Sopn Generic drug: Dulaglutide Inject 3 mg as directed once a week.        Allergies:  Allergies  Allergen Reactions   Hydroxyzine Hcl Other (See Comments)    Weakness, out of this world feeling. sluggishness   Latex Rash    I.e. Elastic= rash to blisters if worn for an extended time Patient show effects after long exposure.    Family History: Family History  Problem Relation Age of Onset   COPD Mother  Other Father        muscle tumor   Stroke Father    Prostate cancer Father        mets   Heart disease Father    Alcoholism Father    Stroke Sister    Heart block Sister        Heart bypass   Bladder Cancer Neg Hx    Kidney cancer Neg Hx     Social History:  reports that he quit smoking about 14 years ago. His smoking use included cigarettes. He has a 60.00 pack-year smoking history. He has never used smokeless tobacco. He reports that he does not drink alcohol and does not use drugs.   Physical Exam: BP 132/78   Pulse 92   Ht _0  (1.727 m)   Wt 194 lb (88 kg)   BMI 29.50 kg/m   Constitutional:  Alert and oriented, No acute distress. HEENT: [place] AT, moist mucus membranes.  Trachea midline, no masses. Cardiovascular: No clubbing, cyanosis, or edema. Respiratory: Normal respiratory effort, no increased work of breathing. Skin: No rashes, bruises or suspicious lesions. Neurologic: Grossly intact, no focal deficits, moving all 4 extremities. Psychiatric: Normal mood and affect.  Laboratory Data: Lab Results  Component Value Date   WBC 4.6 12/02/2021   HGB 12.4 (L) 12/02/2021   HCT 35.8 (L) 12/02/2021   MCV 93.5 12/02/2021   PLT 140.0 (L) 12/02/2021    Lab Results  Component Value Date   CREATININE 0.85 12/31/2021    Lab  Results  Component Value Date   HGBA1C 8.3 (H) 12/31/2021    Urinalysis    Component Value Date/Time   COLORURINE AMBER (A) 10/22/2021 0020   APPEARANCEUR Cloudy (A) 12/12/2021 1438   LABSPEC 1.021 10/22/2021 0020   PHURINE 6.0 10/22/2021 0020   GLUCOSEU 1+ (A) 12/12/2021 1438   GLUCOSEU NEGATIVE 09/01/2013 0942   HGBUR NEGATIVE 10/22/2021 0020   BILIRUBINUR Negative 12/12/2021 1438   KETONESUR NEGATIVE 10/22/2021 0020   PROTEINUR 1+ (A) 12/12/2021 1438   PROTEINUR 30 (A) 10/22/2021 0020   UROBILINOGEN 0.2 12/02/2021 1138   UROBILINOGEN 0.2 09/01/2013 0942   NITRITE Positive (A) 12/12/2021 1438   NITRITE POSITIVE (A) 10/22/2021 0020   LEUKOCYTESUR 3+ (A) 12/12/2021 1438   LEUKOCYTESUR LARGE (A) 10/22/2021 0020    Lab Results  Component Value Date   LABMICR See below: 12/12/2021   WBCUA >30 (A) 12/12/2021   RBCUA None seen 08/28/2016   LABEPIT 0-10 12/12/2021   MUCUS Presence of (A) 12/02/2021   BACTERIA Many (A) 12/12/2021     Assessment & Plan:    1. Prostate cancer ([place]) No evidence of disease, will continue to check PSA on a every 6 month basis to which he is agreeable  Urinary symptoms have improved/resolved and as such, okay to stop Flomax  2. UPJ obstruction, acquired Asymptomatic, will manage conservatively trend creatinine and consider serial imaging depending on his symptoms   Return in about 6 months (around 09/24/2022) for 6 month PSA lab only, 1 year PSA/IPSS/PVR with MD.  Hollice Espy, MD  [place] [address], [place], [place] [address] [phone]

## 2022-04-02 ENCOUNTER — Other Ambulatory Visit: Payer: Self-pay

## 2022-04-02 ENCOUNTER — Ambulatory Visit (INDEPENDENT_AMBULATORY_CARE_PROVIDER_SITE_OTHER): Payer: Medicare Other | Admitting: Orthopaedic Surgery

## 2022-04-02 ENCOUNTER — Ambulatory Visit (INDEPENDENT_AMBULATORY_CARE_PROVIDER_SITE_OTHER): Payer: Medicare Other | Admitting: Family Medicine

## 2022-04-02 ENCOUNTER — Encounter: Payer: Self-pay | Admitting: Family Medicine

## 2022-04-02 VITALS — BP 118/74 | HR 105 | Temp 98.5°F | Ht 68.0 in | Wt 196.8 lb

## 2022-04-02 DIAGNOSIS — M25511 Pain in right shoulder: Secondary | ICD-10-CM | POA: Diagnosis not present

## 2022-04-02 DIAGNOSIS — G4733 Obstructive sleep apnea (adult) (pediatric): Secondary | ICD-10-CM | POA: Diagnosis not present

## 2022-04-02 DIAGNOSIS — E7800 Pure hypercholesterolemia, unspecified: Secondary | ICD-10-CM

## 2022-04-02 DIAGNOSIS — G8929 Other chronic pain: Secondary | ICD-10-CM

## 2022-04-02 DIAGNOSIS — E1165 Type 2 diabetes mellitus with hyperglycemia: Secondary | ICD-10-CM

## 2022-04-02 DIAGNOSIS — F320 Major depressive disorder, single episode, mild: Secondary | ICD-10-CM | POA: Diagnosis not present

## 2022-04-02 DIAGNOSIS — I255 Ischemic cardiomyopathy: Secondary | ICD-10-CM

## 2022-04-02 DIAGNOSIS — H903 Sensorineural hearing loss, bilateral: Secondary | ICD-10-CM

## 2022-04-02 NOTE — Assessment & Plan Note (Signed)
Given that this is worse on the left side I am going to refer him to ENT.  He will continue the Zyrtec and Flonase and if his symptoms eventually resolve I advised he could cancel his ENT appointment.  If not getting better he needs to see ENT.

## 2022-04-02 NOTE — Progress Notes (Signed)
The patient is a 71 year old who continues to have severe debilitating pain and decreased motion with his right shoulder.  He has tried and failed all forms of conservative treatment including activity modification, shoulder strengthening exercises, and steroid injections in the shoulder.  He does have a history of diabetes and his A1c 3 months ago was 8.3.  He says he believes he is having that checked again today.  His shoulder still hurting quite a bit with overhead activities and reaching behind him.  It is weak.  He feels grinding in the right shoulder.  It is detrimentally affecting his mobility, his quality of life and his actives of daily living.  It is affecting his sleep as well.  On exam there is some grinding of the glenohumeral joint and AC joint of the right shoulder.  He has significant limitations in motion secondary to his pain.  There is also weakness in the rotator cuff.  Previous x-rays of the right shoulder do show significant arthritis of the [facility] joint but also some inferior spurring of the humeral head.  At this point a MRI of his right shoulder is warranted so we can assess the soft tissue and the cartilage in order to make a determination of what the next steps will be given the failure conservative treatment.  We will see him back in follow-up once we have the MRI of his right shoulder.  He agrees with this treatment plan.

## 2022-04-02 NOTE — Assessment & Plan Note (Signed)
He will continue to see orthopedics.

## 2022-04-02 NOTE — Assessment & Plan Note (Signed)
Check A1c and urine microalbumin creatinine ratio.  He will continue metformin 1000 mg twice daily, Lantus 25 units daily, and Trulicity 3 mg weekly.

## 2022-04-02 NOTE — Assessment & Plan Note (Signed)
Mild.  He declines medication.  He will monitor.

## 2022-04-02 NOTE — Progress Notes (Signed)
Isaac Rumps, MD Phone: [phone]  [name] is a 71 y.o. male who presents today for f/u.  DIABETES Disease Monitoring: Blood Sugar ranges-125-160 Polyuria/phagia/dipsia- no      Optho- UTD Medications: Compliance- taking lantus, metformin, trulicity Hypoglycemic symptoms- only if he does not eat consistently  Hearing difficulty: Patient notes this has been going on for several months.  It is left greater than right hearing reduction.  He saw audiology and they determined that this was likely related to eustachian tube dysfunction.  Has been using Flonase and Zyrtec for the last few days and notes it has started to help a little bit.  He notes he can hardly hear out of his left ear.  OSA: Patient never got contacted about the sleep study.  Right shoulder pain: He continues to see orthopedics.  They plan to do an MRI to figure out what kind of surgery he will need.  Depression: Patient notes mild depression related to his current physical issues.  He is not interested in medication.  No SI.   Social History   Tobacco Use  Smoking Status Former   Packs/day: 1.50   Years: 40.00   Total pack years: 60.00   Types: Cigarettes   Quit date: 09/14/2007   Years since quitting: 14.5  Smokeless Tobacco Never  Tobacco Comments   quit 2009    Current Outpatient Medications on File Prior to Visit  Medication Sig Dispense Refill   aspirin EC 325 MG EC tablet Take 1 tablet (325 mg total) by mouth daily. 30 tablet 0   atorvastatin (LIPITOR) 80 MG tablet Take 1 tablet (80 mg total) by mouth daily. 90 tablet 3   blood glucose meter kit and supplies KIT Dispense based on patient and insurance preference. Use up to four times daily as directed. 1 each 0   carvedilol (COREG) 6.25 MG tablet Take 1 tablet (6.25 mg total) by mouth 2 (two) times daily with a meal. 180 tablet 3   diphenhydramine-acetaminophen (TYLENOL PM) 25-500 MG TABS tablet Take 2 tablets by mouth at bedtime.      Dulaglutide (TRULICITY) 3 OV/7.8HY SOPN Inject 3 mg as directed once a week. 6 mL 3   ezetimibe (ZETIA) 10 MG tablet Take 1 tablet (10 mg total) by mouth daily. 90 tablet 3   furosemide (LASIX) 40 MG tablet Take 1 tablet (40 mg total) by mouth daily. 90 tablet 3   insulin glargine (LANTUS) 100 UNIT/ML Solostar Pen Inject 25 Units into the skin daily. 15 mL 0   Insulin Pen Needle 31G X 5 MM MISC Inject insulin via insulin pen daily 50 each 0   isosorbide mononitrate (IMDUR) 30 MG 24 hr tablet Take 1 tablet (30 mg total) by mouth daily. 90 tablet 3   losartan (COZAAR) 25 MG tablet Take 1 tablet (25 mg total) by mouth daily. 90 tablet 3   metFORMIN (GLUCOPHAGE) 1000 MG tablet TAKE 1 TABLET TWICE A DAY WITH MEALS 180 tablet 3   Multiple Vitamins-Minerals (MULTIVITAMINS THER. W/MINERALS) TABS Take 1 tablet by mouth every morning.     niacin (NIASPAN) 1000 MG CR tablet Take 2 tablets (2000 mg) by mouth once daily at bedtime 180 tablet 3   potassium chloride SA (KLOR-CON M) 20 MEQ tablet Take 1 tablet (20 mEq total) by mouth daily. 90 tablet 3   No current facility-administered medications on file prior to visit.     ROS see history of present illness  Objective  Physical Exam Vitals:  04/02/22 1427  BP: 118/74  Pulse: (!) 105  Temp: 98.5 F (36.9 C)  SpO2: 96%    BP Readings from Last 3 Encounters:  04/02/22 118/74  03/26/22 132/78  03/10/22 130/62   Wt Readings from Last 3 Encounters:  04/02/22 196 lb 12.8 oz (89.3 kg)  03/26/22 194 lb (88 kg)  03/10/22 194 lb 2 oz (88.1 kg)    Physical Exam Constitutional:      General: He is not in acute distress.    Appearance: He is not diaphoretic.  HENT:     Right Ear: Tympanic membrane normal.     Left Ear: Tympanic membrane normal.  Cardiovascular:     Rate and Rhythm: Normal rate and regular rhythm.     Heart sounds: Normal heart sounds.  Pulmonary:     Effort: Pulmonary effort is normal.     Breath sounds: Normal breath  sounds.  Skin:    General: Skin is warm and dry.  Neurological:     Mental Status: He is alert.      Assessment/Plan: Please see individual problem list.  Problem List Items Addressed This Visit     Hearing loss (Chronic)    Given that this is worse on the left side I am going to refer him to ENT.  He will continue the Zyrtec and Flonase and if his symptoms eventually resolve I advised he could cancel his ENT appointment.  If not getting better he needs to see ENT.      Hyperlipemia (Chronic)   Relevant Orders   Comp Met (CMET)   Lipid panel   OSA (obstructive sleep apnea) (Chronic)    Refer to pulmonology.  Patient notes his insurance covers [facility] providers thus this would be the better route than getting a home sleep study that is not associated with [facility].      Right shoulder pain (Chronic)    He will continue to see orthopedics.      Uncontrolled type 2 diabetes mellitus with hyperglycemia, without long-term current use of insulin (HCC) - Primary (Chronic)    Check A1c and urine microalbumin creatinine ratio.  He will continue metformin 1000 mg twice daily, Lantus 25 units daily, and Trulicity 3 mg weekly.      Relevant Orders   HgB A1c   Urine Microalbumin w/creat. ratio   Depression, major, single episode, mild (HCC)    Mild.  He declines medication.  He will monitor.        Health Maintenance: Patient knows he is due for colon cancer screening.  He notes he is going to contact GI to get this set up.  He had previously deferred this given that he was status post prostate surgery for prostate cancer.  Return in about 3 months (around 07/03/2022).   Isaac Rumps, MD [place]

## 2022-04-02 NOTE — Assessment & Plan Note (Signed)
Refer to pulmonology.  Patient notes his insurance covers [facility] providers thus this would be the better route than getting a home sleep study that is not associated with .

## 2022-04-03 LAB — LIPID PANEL
Cholesterol: 89 mg/dL (ref <200)
HDL: 35 mg/dL — ABNORMAL LOW (ref >=40)
LDL Cholesterol (Calc): 36 mg/dL (calc)
Non-HDL Cholesterol (Calc): 54 mg/dL (calc) (ref <130)
Total CHOL/HDL Ratio: 2.5 (calc) (ref <5.0)
Triglycerides: 96 mg/dL (ref <150)

## 2022-04-03 LAB — HEMOGLOBIN A1C
Hgb A1c MFr Bld: 10.3 % of total Hgb — ABNORMAL HIGH (ref <5.7)
Mean Plasma Glucose: 249 mg/dL
eAG (mmol/L): 13.8 mmol/L

## 2022-04-03 LAB — COMPREHENSIVE METABOLIC PANEL
AG Ratio: 1.8 (calc) (ref 1.0–2.5)
ALT: 22 U/L (ref 9–46)
AST: 16 U/L (ref 10–35)
Albumin: 4.4 g/dL (ref 3.6–5.1)
Alkaline phosphatase (APISO): 86 U/L (ref 35–144)
BUN: 17 mg/dL (ref 7–25)
CO2: 30 mmol/L (ref 20–32)
Calcium: 9.9 mg/dL (ref 8.6–10.3)
Chloride: 98 mmol/L (ref 98–110)
Creat: 1.2 mg/dL (ref 0.70–1.28)
Globulin: 2.4 g/dL (calc) (ref 1.9–3.7)
Glucose, Bld: 438 mg/dL — ABNORMAL HIGH (ref 65–99)
Potassium: 4.1 mmol/L (ref 3.5–5.3)
Sodium: 137 mmol/L (ref 135–146)
Total Bilirubin: 1.8 mg/dL — ABNORMAL HIGH (ref 0.2–1.2)
Total Protein: 6.8 g/dL (ref 6.1–8.1)

## 2022-04-03 LAB — MICROALBUMIN / CREATININE URINE RATIO
Creatinine, Urine: 32 mg/dL (ref 20–320)
Microalb Creat Ratio: 19 mcg/mg creat (ref <30)
Microalb, Ur: 0.6 mg/dL

## 2022-04-06 ENCOUNTER — Encounter: Payer: Self-pay | Admitting: Family Medicine

## 2022-04-06 DIAGNOSIS — H9192 Unspecified hearing loss, left ear: Secondary | ICD-10-CM

## 2022-04-08 ENCOUNTER — Other Ambulatory Visit: Payer: Self-pay

## 2022-04-11 ENCOUNTER — Telehealth: Payer: Self-pay

## 2022-04-11 NOTE — Telephone Encounter (Signed)
Pt returning call

## 2022-04-11 NOTE — Telephone Encounter (Signed)
LMTCB regarding his labs

## 2022-04-14 ENCOUNTER — Other Ambulatory Visit: Payer: Self-pay | Admitting: Family Medicine

## 2022-04-14 MED ORDER — TIRZEPATIDE 2.5 MG/0.5ML ~~LOC~~ SOAJ: 2.5000 mg | SUBCUTANEOUS | 0 refills | Status: DC

## 2022-04-14 MED ORDER — TIRZEPATIDE 5 MG/0.5ML ~~LOC~~ SOAJ: 5.0000 mg | SUBCUTANEOUS | 3 refills | Status: DC

## 2022-04-14 NOTE — Telephone Encounter (Signed)
Spoke to Patient regarding his lab results

## 2022-04-15 ENCOUNTER — Encounter: Payer: Self-pay | Admitting: *Deleted

## 2022-04-15 ENCOUNTER — Encounter: Payer: Self-pay | Admitting: Orthopaedic Surgery

## 2022-04-16 ENCOUNTER — Other Ambulatory Visit (INDEPENDENT_AMBULATORY_CARE_PROVIDER_SITE_OTHER): Payer: Medicare Other

## 2022-04-16 LAB — HEPATIC FUNCTION PANEL
ALT: 44 U/L (ref 0–53)
AST: 18 U/L (ref 0–37)
Albumin: 4.2 g/dL (ref 3.5–5.2)
Alkaline Phosphatase: 124 U/L — ABNORMAL HIGH (ref 39–117)
Bilirubin, Direct: 0.3 mg/dL (ref 0.0–0.3)
Total Bilirubin: 1.5 mg/dL — ABNORMAL HIGH (ref 0.2–1.2)
Total Protein: 6.6 g/dL (ref 6.0–8.3)

## 2022-04-16 NOTE — Telephone Encounter (Signed)
Good morning Sorry you have not heard from anyone yet in regarding the MRI. I checked to see what the hold up is and looks like no one from the imaging saw the order come in. I will send it as an Urgent so they can get you scheduled sooner than later. If you do not hear from anyone by end of week please call [phone] to schedule the appt.

## 2022-04-17 ENCOUNTER — Telehealth: Payer: Self-pay

## 2022-04-17 ENCOUNTER — Encounter: Payer: Self-pay | Admitting: *Deleted

## 2022-04-17 NOTE — Telephone Encounter (Signed)
Prior Auth Request form for Mounjaro 2.5 mg/0.5 for pt was received needing providers signature and date. Form has been placed in to be signed folder.

## 2022-04-22 ENCOUNTER — Encounter: Payer: Self-pay | Admitting: Dermatology

## 2022-04-22 ENCOUNTER — Other Ambulatory Visit: Payer: Self-pay | Admitting: Family Medicine

## 2022-04-22 ENCOUNTER — Ambulatory Visit (INDEPENDENT_AMBULATORY_CARE_PROVIDER_SITE_OTHER): Payer: Medicare Other | Admitting: Dermatology

## 2022-04-22 DIAGNOSIS — B078 Other viral warts: Secondary | ICD-10-CM | POA: Diagnosis not present

## 2022-04-22 NOTE — Telephone Encounter (Signed)
Signed.

## 2022-04-22 NOTE — Patient Instructions (Signed)
Squaric Acid 3% applied to left upper inner arm and covered with band aid. Patient has topical steroid available to treat topically if rash occurs. Delos Haring apply twice daily as needed for irritation.   Cryotherapy Aftercare  Wash gently with soap and water everyday.   Apply Vaseline and Band-Aid daily until healed.   Instructions for After In-Office Application of Cantharidin  1. This is a strong medicine; please follow ALL instructions.  2. Gently wash off with soap and water in four hours or sooner s directed by your physician.  3. **WARNING** this medicine can cause severe blistering, blood blisters, infection, and/or scarring if it is not washed off as directed.  4. Your progress will be rechecked in 1-2 months; call sooner if there are any questions or problems.   Cantharidin Plus is a blistering agent that comes from a beetle.  It needs to be washed off in about 4 hours after application.  Although it is painless when applied in office, it may cause symptoms of mild pain and burning several hours later.  Treated areas will swell and turn red, and blisters may form.  Vaseline and a bandaid may be applied until wound has healed.  Once healed, the skin may remain temporarily discolored.  It can take weeks to months for pigmentation to return to normal.  Advised to wash off with soap and water in 4 hours or sooner if it becomes tender before then.    Due to recent changes in healthcare laws, you may see results of your pathology and/or laboratory studies on MyChart before the doctors have had a chance to review them. We understand that in some cases there may be results that are confusing or concerning to you. Please understand that not all results are received at the same time and often the doctors may need to interpret multiple results in order to provide you with the best plan of care or course of treatment. Therefore, we ask that you please give [place] 2 business days to thoroughly review all  your results before contacting the office for clarification. Should we see a critical lab result, you will be contacted sooner.   If You Need Anything After Your Visit  If you have any questions or concerns for your doctor, please call our main line at [phone] and press option 4 to reach your doctor's medical assistant. If no one answers, please leave a voicemail as directed and we will return your call as soon as possible. Messages left after 4 pm will be answered the following business day.   You may also send [place] a message via [place] [place]. We typically respond to MyChart messages within 1-2 business days.  For prescription refills, please ask your pharmacy to contact our office. Our fax number is [phone].  If you have an urgent issue when the clinic is closed that cannot wait until the next business day, you can page your doctor at the number below.    Please note that while we do our best to be available for urgent issues outside of office hours, we are not available 24/7.   If you have an urgent issue and are unable to reach [place], you may choose to seek medical care at your doctor's office, retail clinic, urgent care center, or emergency room.  If you have a medical emergency, please immediately call 911 or go to the emergency department.  Pager Numbers  - Dr. Nehemiah Massed: [phone]  - Dr. Laurence Ferrari: [phone]  - Dr. Nicole Kindred: [phone]  In the event of inclement weather, please call our main line at [phone] for an update on the status of any delays or closures.  Dermatology Medication Tips: Please keep the boxes that topical medications come in in order to help keep track of the instructions about where and how to use these. Pharmacies typically print the medication instructions only on the boxes and not directly on the medication tubes.   If your medication is too expensive, please contact our office at [phone] option 4 or send [place] a message through [place].   We  are unable to tell what your co-pay for medications will be in advance as this is different depending on your insurance coverage. However, we may be able to find a substitute medication at lower cost or fill out paperwork to get insurance to cover a needed medication.   If a prior authorization is required to get your medication covered by your insurance company, please allow [place] 1-2 business days to complete this process.  Drug prices often vary depending on where the prescription is filled and some pharmacies may offer cheaper prices.  The website www.goodrx.com contains coupons for medications through different pharmacies. The prices here do not account for what the cost may be with help from insurance (it may be cheaper with your insurance), but the website can give you the price if you did not use any insurance.  - You can print the associated coupon and take it with your prescription to the pharmacy.  - You may also stop by our office during regular business hours and pick up a GoodRx coupon card.  - If you need your prescription sent electronically to a different pharmacy, notify our office through [facility] or by phone at [phone] option 4.     Si Usted Necesita Algo Despus de Su Visita  Tambin puede enviarnos un mensaje a travs de Pharmacist, community. Por lo general respondemos a los mensajes de MyChart en el transcurso de 1 a 2 das hbiles.  Para renovar recetas, por favor pida a su farmacia que se ponga en contacto con nuestra oficina. Harland Dingwall de fax es [place] [phone].  Si tiene un asunto urgente cuando la clnica est cerrada y que no puede esperar hasta el siguiente da hbil, puede llamar/localizar a su doctor(a) al nmero que aparece a continuacin.   Por favor, tenga en cuenta que aunque hacemos todo lo posible para estar disponibles para asuntos urgentes fuera del horario de College, no estamos disponibles las 24 horas del da, los 7 das de la [place].   Si tiene  un problema urgente y no puede comunicarse con nosotros, puede optar por buscar atencin mdica  en el consultorio de su doctor(a), en una clnica privada, en un centro de atencin urgente o en una sala de emergencias.  Si tiene Engineering geologist, por favor llame inmediatamente al 911 o vaya a la sala de emergencias.  Nmeros de bper  - Dr. Nehemiah Massed: [phone]  - Dra. Moye: [phone]  - Dra. Nicole Kindred: [phone]  En caso de inclemencias del [place], por favor llame a Johnsie Kindred principal al [phone] para una actualizacin sobre el [place] de cualquier retraso o cierre.  Consejos para la medicacin en dermatologa: Por favor, guarde las cajas en las que vienen los medicamentos de uso tpico para ayudarle a seguir las instrucciones sobre dnde y cmo usarlos. Las farmacias generalmente imprimen las instrucciones del medicamento slo en las cajas y no directamente en los tubos del [place].  Si su medicamento es muy caro, por favor, pngase en contacto con Zigmund Daniel llamando al [phone] y presione la opcin 4 o envenos un mensaje a travs de Pharmacist, community.   No podemos decirle cul ser su copago por los medicamentos por adelantado ya que esto es diferente dependiendo de la cobertura de su seguro. Sin embargo, es posible que podamos encontrar un medicamento sustituto a Electrical engineer un formulario para que el seguro cubra el medicamento que se considera necesario.   Si se requiere una autorizacin previa para que su compaa de seguros [place] su medicamento, por favor permtanos de 1 a 2 das hbiles para completar este proceso.  Los precios de los medicamentos varan con frecuencia dependiendo del Environmental consultant de dnde se surte la receta y alguna farmacias pueden ofrecer precios ms baratos.  El sitio web www.goodrx.com tiene cupones para medicamentos de Airline pilot. Los precios aqu no tienen en cuenta lo que podra costar con la ayuda del seguro (puede ser  ms barato con su seguro), pero el sitio web puede darle el precio si no utiliz Research scientist (physical sciences).  - Puede imprimir el cupn correspondiente y llevarlo con su receta a la farmacia.  - Tambin puede pasar por nuestra oficina durante el horario de atencin regular y Charity fundraiser una tarjeta de cupones de GoodRx.  - Si necesita que su receta se enve electrnicamente a una farmacia diferente, informe a nuestra oficina a travs de MyChart de [place] o por telfono llamando al [phone] y presione la opcin 4.

## 2022-04-22 NOTE — Progress Notes (Signed)
Follow-Up Visit   Subjective  [name] is a 71 y.o. male who presents for the following: Warts (1 month follow up. Right thumb. Bx proven. Hx of LN2, Squaric Acid and Cantharone plus treatment).  The following portions of the chart were reviewed this encounter and updated as appropriate:  Tobacco  Allergies  Meds  Problems  Med Hx  Surg Hx  Fam Hx      Review of Systems: No other skin or systemic complaints except as noted in HPI or Assessment and Plan.   Objective  Well appearing patient in no apparent distress; mood and affect are within normal limits.  A focused examination was performed including right thumb. Relevant physical exam findings are noted in the Assessment and Plan.  Right Thumb x1 Verrucous papules -- Discussed viral etiology and contagion.    Assessment & Plan  Other viral warts Right Thumb x1  Viral Wart (HPV) Counseling  Discussed viral / HPV (Human Papilloma Virus) etiology and risk of spread /infectivity to other areas of body as well as to other people.  Multiple treatments and methods may be required to clear warts and it is possible treatment may not be successful.  Treatment risks include discoloration; scarring and there is still potential for wart recurrence.  Squaric Acid 3% applied to left upper inner arm and covered with band aid. Patient has topical steroid available to treat topically if rash occurs.    Prior to procedure, discussed risks of blister formation, small wound, skin dyspigmentation, or rare scar following cryotherapy. Recommend Vaseline ointment to treated areas while healing.  Squaric Acid 3% applied to warts today. Prior to application reviewed risk of inflammation and irritation.  Cantharidin Plus is a blistering agent that comes from a beetle.  It needs to be washed off in about 4 hours after application.  Although it is painless when applied in office, it may cause symptoms of mild pain and burning several hours  later.  Treated areas will swell and turn red, and blisters may form.  Vaseline and a bandaid may be applied until wound has healed.  Once healed, the skin may remain temporarily discolored.  It can take weeks to months for pigmentation to return to normal.  Advised to wash off with soap and water in 4 hours or sooner if it becomes tender before then.   Destruction of lesion - Right Thumb x1  Destruction method: cryotherapy   Informed consent: discussed and consent obtained   Lesion destroyed using liquid nitrogen: Yes   Region frozen until ice ball extended beyond lesion: Yes   Outcome: patient tolerated procedure well with no complications   Post-procedure details: wound care instructions given   Additional details:  Prior to procedure, discussed risks of blister formation, small wound, skin dyspigmentation, or rare scar following cryotherapy. Recommend Vaseline ointment to treated areas while healing.   Destruction of lesion - Right Thumb x1  Destruction method: chemical removal   Informed consent: discussed and consent obtained   Timeout:  patient name, date of birth, surgical site, and procedure verified Chemical destruction method comment:  Cantharone Plus Procedure instructions: patient instructed to wash and dry area   Procedure instructions comment:  Wash off in 4 hours Outcome: patient tolerated procedure well with no complications   Post-procedure details: wound care instructions given   Additional details:  Squaric Acid 3% applied to warts today. Prior to application reviewed risk of inflammation and irritation.    Return in about 4 weeks (  around 05/20/2022) for Wart Follow UP.  I, Emelia Salisbury, CMA, am acting as scribe for Forest Gleason, MD.   Documentation: I have reviewed the above documentation for accuracy and completeness, and I agree with the above.  Forest Gleason, MD

## 2022-04-26 ENCOUNTER — Ambulatory Visit
Admission: RE | Admit: 2022-04-26 | Discharge: 2022-04-26 | Disposition: A | Discharge status: Home / Self Care | Payer: Medicare Other | Source: Ambulatory Visit | Attending: Orthopaedic Surgery | Admitting: Orthopaedic Surgery

## 2022-04-26 DIAGNOSIS — M7551 Bursitis of right shoulder: Secondary | ICD-10-CM | POA: Diagnosis not present

## 2022-04-26 DIAGNOSIS — G8929 Other chronic pain: Secondary | ICD-10-CM

## 2022-04-26 DIAGNOSIS — S43401A Unspecified sprain of right shoulder joint, initial encounter: Secondary | ICD-10-CM | POA: Diagnosis not present

## 2022-04-26 DIAGNOSIS — S46011A Strain of muscle(s) and tendon(s) of the rotator cuff of right shoulder, initial encounter: Secondary | ICD-10-CM | POA: Diagnosis not present

## 2022-04-26 DIAGNOSIS — M19011 Primary osteoarthritis, right shoulder: Secondary | ICD-10-CM | POA: Diagnosis not present

## 2022-04-29 ENCOUNTER — Ambulatory Visit
Admission: RE | Admit: 2022-04-29 | Discharge: 2022-04-29 | Disposition: A | Discharge status: Home / Self Care | Payer: Medicare Other | Source: Ambulatory Visit | Attending: Family Medicine | Admitting: Family Medicine

## 2022-04-29 DIAGNOSIS — N1330 Unspecified hydronephrosis: Secondary | ICD-10-CM | POA: Diagnosis not present

## 2022-05-01 ENCOUNTER — Encounter: Payer: Self-pay | Admitting: Dermatology

## 2022-05-02 ENCOUNTER — Encounter: Payer: Self-pay | Admitting: Family Medicine

## 2022-05-02 ENCOUNTER — Other Ambulatory Visit: Payer: Self-pay | Admitting: Family Medicine

## 2022-05-02 DIAGNOSIS — K769 Liver disease, unspecified: Secondary | ICD-10-CM

## 2022-05-07 ENCOUNTER — Encounter: Payer: Self-pay | Admitting: Orthopaedic Surgery

## 2022-05-07 ENCOUNTER — Other Ambulatory Visit: Payer: Self-pay | Admitting: Orthopaedic Surgery

## 2022-05-07 ENCOUNTER — Telehealth: Payer: Self-pay | Admitting: Orthopaedic Surgery

## 2022-05-07 ENCOUNTER — Ambulatory Visit (INDEPENDENT_AMBULATORY_CARE_PROVIDER_SITE_OTHER): Payer: Medicare Other | Admitting: Orthopaedic Surgery

## 2022-05-07 DIAGNOSIS — M25511 Pain in right shoulder: Secondary | ICD-10-CM | POA: Diagnosis not present

## 2022-05-07 DIAGNOSIS — G8929 Other chronic pain: Secondary | ICD-10-CM | POA: Diagnosis not present

## 2022-05-07 DIAGNOSIS — I255 Ischemic cardiomyopathy: Secondary | ICD-10-CM

## 2022-05-07 MED ORDER — HYDROCODONE-ACETAMINOPHEN 5-325 MG PO TABS: 1.0000 | ORAL_TABLET | Freq: Three times a day (TID) | ORAL | 0 refills | Status: DC | PRN

## 2022-05-07 NOTE — Telephone Encounter (Signed)
Pt had an appt today and forgot to ask for pain medication. Please send to [facility]. Pt phone number is [phone].

## 2022-05-07 NOTE — Progress Notes (Signed)
The patient comes in today to go over an MRI of his right shoulder.  He is an active 71 year old gentleman and he is been having shoulder weakness and pain has been worsening over a long period of time.  We have tried conservative treatment and after the failure of that we sent her for an MRI to better evaluate the soft tissue and the joint itself.  On exam he still has significant pain and weakness with rotation of that right shoulder.  He does have a lot of pain around the deltoid and the biceps muscle as well.  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  The rotator cuff itself appears to be intact with significant tendinosis.  I would like to send him to my partner Dr. Marlou Sa for further evaluation of the patient's right shoulder given the significant glenohumeral arthritis.  The patient agrees with this referral given his discomfort and decreased mobility with his right shoulder.

## 2022-05-08 ENCOUNTER — Ambulatory Visit
Admission: RE | Admit: 2022-05-08 | Discharge: 2022-05-08 | Disposition: A | Discharge status: Home / Self Care | Payer: Medicare Other | Source: Ambulatory Visit | Attending: Family Medicine | Admitting: Family Medicine

## 2022-05-08 DIAGNOSIS — Z8546 Personal history of malignant neoplasm of prostate: Secondary | ICD-10-CM | POA: Diagnosis not present

## 2022-05-08 DIAGNOSIS — I700 Atherosclerosis of aorta: Secondary | ICD-10-CM | POA: Diagnosis not present

## 2022-05-08 DIAGNOSIS — K769 Liver disease, unspecified: Secondary | ICD-10-CM | POA: Diagnosis not present

## 2022-05-08 DIAGNOSIS — K5730 Diverticulosis of large intestine without perforation or abscess without bleeding: Secondary | ICD-10-CM | POA: Diagnosis not present

## 2022-05-08 DIAGNOSIS — K7689 Other specified diseases of liver: Secondary | ICD-10-CM | POA: Diagnosis not present

## 2022-05-08 MED ORDER — GADOBUTROL 1 MMOL/ML IV SOLN
9.0000 mL | Freq: Once | INTRAVENOUS | Status: AC | PRN
  Administered 2022-05-08: 9 mL via INTRAVENOUS

## 2022-05-24 ENCOUNTER — Encounter: Payer: Self-pay | Admitting: Family Medicine

## 2022-05-24 DIAGNOSIS — E1165 Type 2 diabetes mellitus with hyperglycemia: Secondary | ICD-10-CM

## 2022-05-26 MED ORDER — INSULIN GLARGINE 100 UNIT/ML SOLOSTAR PEN: 25.0000 [IU] | PEN_INJECTOR | Freq: Every day | SUBCUTANEOUS | 1 refills | Status: DC

## 2022-05-28 ENCOUNTER — Ambulatory Visit (INDEPENDENT_AMBULATORY_CARE_PROVIDER_SITE_OTHER): Payer: Medicare Other | Admitting: Orthopedic Surgery

## 2022-05-28 ENCOUNTER — Encounter: Payer: Self-pay | Admitting: Orthopedic Surgery

## 2022-05-28 DIAGNOSIS — M19011 Primary osteoarthritis, right shoulder: Secondary | ICD-10-CM

## 2022-05-28 NOTE — Progress Notes (Signed)
Office Visit Note   Patient: [name]           Date of Birth: 29-Dec-1950           MRN: [id number] Visit Date: 05/28/2022 Requested by: [name], MD [address] [place],  [place] [address] PCP: [name], MD  Subjective: Chief Complaint  Patient presents with   Right Shoulder - Pain    HPI: [name] is a 72 y.o. male who presents to the office reporting right shoulder pain.  He cannot sleep on the right side.  Left shoulder actually is functional and minimally painful.  Did have coronary artery bypass grafting last year.  He is retired.  Hard for him to ride motorcycles because the vibration hurts his right shoulder.  Prostate cancer as well.  Shoulder MRI scan shows significant arthritis with B2 glenoid and deformity.  Tendinosis of the subscapularis tendon is also present along with biceps tendinitis..                ROS: All systems reviewed are negative as they relate to the chief complaint within the history of present illness.  Patient denies fevers or chills.  Assessment & Plan: Visit Diagnoses:  1. 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  Plan is right shoulder replacement.  Risk and benefits are discussed.  Reverse shoulder replacement models are utilized along with the rationale for patient specific instrumentation..  Plan thin cut CT scan for preop planning purposes.  Preop cardiac restratification will be necessary.  The risk and benefits of surgical intervention are discussed including not limited to infection instability nerve and vessel damage incomplete restoration of function and incomplete pain relief.  Patient understands and wishes to proceed.  The expected rehabilitation course is also discussed.  Follow-Up Instructions: No follow-ups on file.   Orders:  No orders of the defined types were placed in this encounter.  No orders of  the defined types were placed in this encounter.     Procedures: No procedures performed   Clinical Data: No additional findings.  Objective: Vital Signs: There were no vitals taken for this visit.  Physical Exam:  Constitutional: Patient appears well-developed HEENT:  Head: Normocephalic Eyes:EOM are normal Neck: Normal range of motion Cardiovascular: Normal rate Pulmonary/chest: Effort normal Neurologic: Patient is alert Skin: Skin is warm Psychiatric: Patient has normal mood and affect  Ortho Exam: Ortho exam demonstrates right shoulder range of motion of 30/50/95.  Deltoid is functional.  No discrete AC joint tenderness is present.  Motor or sensory function to the hand is intact.  No masses lymphadenopathy or skin changes noted in that shoulder girdle region.  Subscap strength 5+ out of 5 supraspinatus infraspinatus strength 5- out of 5.  Left shoulder has full active and passive range of motion with good rotator cuff strength.  Specialty Comments:  No specialty comments available.  Imaging: No results found.   PMFS History: Patient Active Problem List   Diagnosis Date Noted   Depression, major, single episode, mild ([place]) 04/02/2022   Nicotine dependence 12/31/2021   Other and unspecified peripheral vertigo 12/31/2021   Neuropathy 12/31/2021   Anemia 11/11/2021   Left hip pain 07/02/2021   Hearing loss 07/02/2021   Foot cramps 03/20/2021   Anxiety 12/11/2020   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   Multiple vessel coronary artery disease 11/20/2020   Bone spicules of jaw  12/27/2018   Adrenal adenoma 03/03/2017   Right shoulder pain 03/03/2017   OSA (obstructive sleep apnea) 07/30/2016   Low back pain 05/15/2016   Actinic keratoses 02/07/2016   Right hip pain 02/07/2016   Ischemic cardiomyopathy    Uncontrolled type 2 diabetes mellitus with hyperglycemia, without long-term current use of insulin ([place]) 11/30/2014   CAD s/p CABG (coronary artery disease) 04/12/2011    HTN (hypertension) 04/12/2011   Hyperlipemia 04/12/2011   Prostate cancer ([place]) 04/12/2011   Past Medical History:  Diagnosis Date   Actinic keratoses    Angina    Asthma    BCC (basal cell carcinoma of skin) 09/06/2020   left upper back lateral (EDC) ,  left upper arm anterior (EDC), left upper back medial (EDC)    Bursitis of left elbow 2018   Diabetes mellitus without complication (HCC)    ED (erectile dysfunction)    HTN (hypertension) 04/12/2011   Hyperlipemia 04/12/2011   Ischemic cardiomyopathy    a. echo 2010: EF 45-50%, mild to mod ant and apical wall HK, trace MR; b. cardiac cath 06/2012: EF 40%, mild MR    Multiple vessel coronary artery disease 04/12/2011   a. remote MI 1996; b. MI 1997 s/p PCI - LAD & diag; c. MI 2009: long stenting P-MLAD & Diag & POBA of jailed diag branch; d. cath 2010: 80% ISR of LAD w/ L-R collats, med Rx; e. cath 2012: 50-60% tub mLAD, 60-70% dLAD, FFR 0.75 -->s/p PCI dissection => w/ 2 stents (3 total); f. cath 06/2012: no changes from 2012 cath, med Rx, patent stents -> MV CAD 11/20/20 --> CABG X 5   Myocardial infarction (HCC)    x 5   Obesity    OSA (obstructive sleep apnea)    on CPAP   Osteoarthritis    Renal disorder    kidney stone    Family History  Problem Relation Age of Onset   COPD Mother    Other Father        muscle tumor   Stroke Father    Prostate cancer Father        mets   Heart disease Father    Alcoholism Father    Stroke Sister    Heart block Sister        Heart bypass   Bladder Cancer Neg Hx    Kidney cancer Neg Hx     Past Surgical History:  Procedure Laterality Date   CORONARY ANGIOPLASTY     x 5   CORONARY ARTERY BYPASS GRAFT N/A 11/20/2020   Procedure: CORONARY ARTERY BYPASS GRAFTING (CABG) x  FIVE (LIMA-dLAD, SVG-PDA, SVG-D1, SeqSVG-OM1-2) ON PUMP  USING LEFT INTERNAL MAMMARY ARTERTY AND LEFT ENDOSCOPIC GREATER SAPHENOUS VEIN CONDUITS, RIGHT LEG OPENED NOT HARVESTED;  Surgeon: Wonda Olds, MD;   Location: [facility] OR;  Service: Open Heart Surgery;  Laterality: N/A;   CORONARY STENT PLACEMENT     x 7   INTRAVASCULAR PRESSURE WIRE/FFR STUDY N/A 11/15/2020   Procedure: INTRAVASCULAR PRESSURE WIRE/FFR STUDY;  Surgeon: Nelva Bush, MD;  Location: [facility];  Service: Cardiovascular;  Laterality: N/A;   LEFT HEART CATH AND CORONARY ANGIOGRAPHY N/A 11/15/2020   Procedure: LEFT HEART CATH AND CORONARY ANGIOGRAPHY;  Surgeon: Nelva Bush, MD;  Location: [facility];  Service: Cardiovascular;  Laterality: N/A;   LEFT HEART CATHETERIZATION WITH CORONARY ANGIOGRAM N/A 04/14/2011   Procedure: LEFT HEART CATHETERIZATION WITH CORONARY ANGIOGRAM;  Surgeon: Pixie Casino, MD;  Location: [facility]  CATH LAB;  Service: Cardiovascular;  Laterality: N/A;   LEFT HEART CATHETERIZATION WITH CORONARY ANGIOGRAM N/A 06/25/2012   Procedure: LEFT HEART CATHETERIZATION WITH CORONARY ANGIOGRAM;  Surgeon: Peter M Martinique, MD;  Location: [facility] CATH LAB;  Service: Cardiovascular;  Laterality: N/A;   TEE WITHOUT CARDIOVERSION N/A 11/20/2020   Procedure: TRANSESOPHAGEAL ECHOCARDIOGRAM (TEE);  Surgeon: Wonda Olds, MD;  Location: [facility];  Service: Open Heart Surgery;  Laterality: N/A;   TONSILLECTOMY AND ADENOIDECTOMY  1955   Social History   Occupational History   Occupation: [employer]-retired   Occupation: Press photographer  Tobacco Use   Smoking status: Former    Packs/day: 1.50    Years: 40.00    Total pack years: 60.00    Types: Cigarettes    Quit date: 09/14/2007    Years since quitting: 14.7   Smokeless tobacco: Never   Tobacco comments:    quit 2009  Vaping Use   Vaping Use: Never used  Substance and Sexual Activity   Alcohol use: No    Alcohol/week: 0.0 standard drinks of alcohol    Comment: rarely 1 beer in last 5 years   Drug use: No    Types: Marijuana    Comment: pt stopped fall 2012   Sexual activity: Never

## 2022-05-30 ENCOUNTER — Other Ambulatory Visit: Payer: Self-pay

## 2022-05-30 DIAGNOSIS — M19011 Primary osteoarthritis, right shoulder: Secondary | ICD-10-CM

## 2022-06-02 ENCOUNTER — Telehealth: Payer: Self-pay | Admitting: Orthopedic Surgery

## 2022-06-02 NOTE — Telephone Encounter (Signed)
Holding until scan obtained.

## 2022-06-02 NOTE — Telephone Encounter (Signed)
Pt states that Dr Marlou Sa stated for him to call when he is schedule for this Wednesday for CT scan to move forward with his surgery. Pt phone number is [phone].

## 2022-06-03 ENCOUNTER — Telehealth: Payer: Self-pay | Admitting: Cardiovascular Disease

## 2022-06-03 NOTE — Telephone Encounter (Signed)
   Name: [name]  DOB: 10-Mar-1951  MRN: [id number]  Primary Cardiologist: [name], MD   Preoperative team, please contact this patient and set up a phone call appointment for further preoperative risk assessment. Please obtain consent and complete medication review. Thank you for your help.  I confirm that guidance regarding antiplatelet and oral anticoagulation therapy has been completed and, if necessary, noted below (none requested).    Lenna Sciara, NP 06/03/2022, 5:00 PM [place]

## 2022-06-03 NOTE — Telephone Encounter (Signed)
   Pre-operative Risk Assessment    Patient Name: [name]  DOB: November 25, 1950 MRN: [id number]      Request for Surgical Clearance    Procedure:   right reverse shoulder arthroplasty   Date of Surgery:  Clearance TBD                                 Surgeon:  Dr Anderson Malta Surgeon's Group or Practice Name:  [facility] Phone number:  [phone] Fax number:  [phone]   Type of Clearance Requested:   - Medical    Type of Anesthesia:  General and interscalene block   Additional requests/questions:    SignedAce Gins   06/03/2022, 4:30 PM

## 2022-06-04 ENCOUNTER — Ambulatory Visit
Admission: RE | Admit: 2022-06-04 | Discharge: 2022-06-04 | Disposition: A | Discharge status: Home / Self Care | Payer: Medicare Other | Source: Ambulatory Visit | Attending: Orthopedic Surgery | Admitting: Orthopedic Surgery

## 2022-06-04 DIAGNOSIS — M19011 Primary osteoarthritis, right shoulder: Secondary | ICD-10-CM

## 2022-06-04 DIAGNOSIS — M25511 Pain in right shoulder: Secondary | ICD-10-CM | POA: Diagnosis not present

## 2022-06-04 NOTE — Telephone Encounter (Signed)
Lvmtcb to call back for clearance appointment

## 2022-06-04 NOTE — Telephone Encounter (Signed)
  Patient Consent for Virtual Visit      PATIENT SCHEDULED 06-10-22 @ 9 AM CONSENT AND, MED REC DONE   [name] declined to provide consent on 06/04/2022 for a virtual visit.  An in person visit will be arranged.   CONSENT FOR VIRTUAL VISIT FOR:  [name]  By participating in this virtual visit I agree to the following:  I hereby voluntarily request, consent and authorize [facility] and its employed or contracted physicians, physician assistants, nurse practitioners or other licensed health care professionals (the Practitioner), to provide me with telemedicine health care services (the "Services") as deemed necessary by the treating Practitioner. I acknowledge and consent to receive the Services by the Practitioner via telemedicine. I understand that the telemedicine visit will involve communicating with the Practitioner through live audiovisual communication technology and the disclosure of certain medical information by electronic transmission. I acknowledge that I have been given the opportunity to request an in-person assessment or other available alternative prior to the telemedicine visit and am voluntarily participating in the telemedicine visit.  I understand that I have the right to withhold or withdraw my consent to the use of telemedicine in the course of my care at any time, without affecting my right to future care or treatment, and that the Practitioner or I may terminate the telemedicine visit at any time. I understand that I have the right to inspect all information obtained and/or recorded in the course of the telemedicine visit and may receive copies of available information for a reasonable fee.  I understand that some of the potential risks of receiving the Services via telemedicine include:  Delay or interruption in medical evaluation due to technological equipment failure or disruption; Information transmitted may not be sufficient (e.g. poor  resolution of images) to allow for appropriate medical decision making by the Practitioner; and/or  In rare instances, security protocols could fail, causing a breach of personal health information.  Furthermore, I acknowledge that it is my responsibility to provide information about my medical history, conditions and care that is complete and accurate to the best of my ability. I acknowledge that Practitioner's advice, recommendations, and/or decision may be based on factors not within their control, such as incomplete or inaccurate data provided by me or distortions of diagnostic images or specimens that may result from electronic transmissions. I understand that the practice of medicine is not an exact science and that Practitioner makes no warranties or guarantees regarding treatment outcomes. I acknowledge that a copy of this consent can be made available to me via my patient portal ([facility]), or I can request a printed copy by calling the office of [facility].    I understand that my insurance will be billed for this visit.   I have read or had this consent read to me. I understand the contents of this consent, which adequately explains the benefits and risks of the Services being provided via telemedicine.  I have been provided ample opportunity to ask questions regarding this consent and the Services and have had my questions answered to my satisfaction. I give my informed consent for the services to be provided through the use of telemedicine in my medical care

## 2022-06-05 NOTE — Telephone Encounter (Signed)
Patient scheduled for 07/03/22

## 2022-06-05 NOTE — Telephone Encounter (Signed)
IC patient to see if he had questions needing to be addressed by Dr Marlou Sa, no answer. Will try to reach back out

## 2022-06-05 NOTE — Telephone Encounter (Signed)
He is posted that I think.  Do I need to call?

## 2022-06-09 NOTE — Progress Notes (Unsigned)
Virtual Visit via Telephone Note   Because of [name]'s co-morbid illnesses, he is at least at moderate risk for complications without adequate follow up.  This format is felt to be most appropriate for this patient at this time.  The patient did not have access to video technology/had technical difficulties with video requiring transitioning to audio format only (telephone).  All issues noted in this document were discussed and addressed.  No physical exam could be performed with this format.  Please refer to the patient's chart for his consent to telehealth for [facility].  Evaluation Performed:  Preoperative cardiovascular risk assessment _____________   Date:  06/09/2022   Patient ID:  [name], DOB Sep 27, 1950, MRN [id number] Patient Location:  Home Provider location:   Office  Primary Care Provider:  [name], MD Primary Cardiologist:  [name], MD  Chief Complaint / Patient Profile   72 y.o. y/o male with a h/o CAD with prior stenting 2009 in [place] (thinks he has 7 stents to LAD and diagonals), stent to LAD with perforation 2012, CABG 11/2020, chronic HFrEF, type 2 DM, tobacco abuse, OSA on CPAP, HLD who is pending right reverse shoulder arthoplasty and presents today for telephonic preoperative cardiovascular risk assessment.  History of Present Illness    [name] is a 72 y.o. male who presents via audio/video conferencing for a telehealth visit today.  Pt was last seen in cardiology clinic on 03/10/22 by Dr. Rockey Situ.  At that time [name] was doing well.  The patient is now pending procedure as outlined above. Since his last visit, he denies chest pain, shortness of breath, lower extremity edema, fatigue, palpitations, melena, hematuria, hemoptysis, diaphoresis, weakness, presyncope, syncope, orthopnea, and PND. PCP ordered a liver MR for elevated bilirubin which was normal.  Activity is limited by  shoulder pain but he continues to be able to achieve > 4 METS activity without concerning cardiac symptoms.   Past Medical History    Past Medical History:  Diagnosis Date   Actinic keratoses    Angina    Asthma    BCC (basal cell carcinoma of skin) 09/06/2020   left upper back lateral (EDC) ,  left upper arm anterior (EDC), left upper back medial (EDC)    Bursitis of left elbow 2018   Diabetes mellitus without complication ([place])    ED (erectile dysfunction)    HTN (hypertension) 04/12/2011   Hyperlipemia 04/12/2011   Ischemic cardiomyopathy    a. echo 2010: EF 45-50%, mild to mod ant and apical wall HK, trace MR; b. cardiac cath 06/2012: EF 40%, mild MR    Multiple vessel coronary artery disease 04/12/2011   a. remote MI 1996; b. MI 1997 s/p PCI - LAD & diag; c. MI 2009: long stenting P-MLAD & Diag & POBA of jailed diag branch; d. cath 2010: 80% ISR of LAD w/ L-R collats, med Rx; e. cath 2012: 50-60% tub mLAD, 60-70% dLAD, FFR 0.75 -->s/p PCI dissection => w/ 2 stents (3 total); f. cath 06/2012: no changes from 2012 cath, med Rx, patent stents -> MV CAD 11/20/20 --> CABG X 5   Myocardial infarction (HCC)    x 5   Obesity    OSA (obstructive sleep apnea)    on CPAP   Osteoarthritis    Renal disorder    kidney stone   Past Surgical History:  Procedure Laterality Date   CORONARY ANGIOPLASTY     x 5  CORONARY ARTERY BYPASS GRAFT N/A 11/20/2020   Procedure: CORONARY ARTERY BYPASS GRAFTING (CABG) x  FIVE (LIMA-dLAD, SVG-PDA, SVG-D1, SeqSVG-OM1-2) ON PUMP  USING LEFT INTERNAL MAMMARY ARTERTY AND LEFT ENDOSCOPIC GREATER SAPHENOUS VEIN CONDUITS, RIGHT LEG OPENED NOT HARVESTED;  Surgeon: Wonda Olds, MD;  Location: [facility];  Service: Open Heart Surgery;  Laterality: N/A;   CORONARY STENT PLACEMENT     x 7   INTRAVASCULAR PRESSURE WIRE/FFR STUDY N/A 11/15/2020   Procedure: INTRAVASCULAR PRESSURE WIRE/FFR STUDY;  Surgeon: Nelva Bush, MD;  Location: [facility] CV LAB;  Service:  Cardiovascular;  Laterality: N/A;   LEFT HEART CATH AND CORONARY ANGIOGRAPHY N/A 11/15/2020   Procedure: LEFT HEART CATH AND CORONARY ANGIOGRAPHY;  Surgeon: Nelva Bush, MD;  Location: [facility] CV LAB;  Service: Cardiovascular;  Laterality: N/A;   LEFT HEART CATHETERIZATION WITH CORONARY ANGIOGRAM N/A 04/14/2011   Procedure: LEFT HEART CATHETERIZATION WITH CORONARY ANGIOGRAM;  Surgeon: Pixie Casino, MD;  Location: [facility] CATH LAB;  Service: Cardiovascular;  Laterality: N/A;   LEFT HEART CATHETERIZATION WITH CORONARY ANGIOGRAM N/A 06/25/2012   Procedure: LEFT HEART CATHETERIZATION WITH CORONARY ANGIOGRAM;  Surgeon: Peter M Martinique, MD;  Location: [facility] CATH LAB;  Service: Cardiovascular;  Laterality: N/A;   TEE WITHOUT CARDIOVERSION N/A 11/20/2020   Procedure: TRANSESOPHAGEAL ECHOCARDIOGRAM (TEE);  Surgeon: Wonda Olds, MD;  Location: [facility];  Service: Open Heart Surgery;  Laterality: N/A;   TONSILLECTOMY AND ADENOIDECTOMY  1955    Allergies  Allergies  Allergen Reactions   Hydroxyzine Hcl Other (See Comments)    Weakness, out of this world feeling. sluggishness   Latex Rash    I.e. Elastic= rash to blisters if worn for an extended time Patient show effects after long exposure.    Home Medications    Prior to Admission medications   Medication Sig Start Date End Date Taking? Authorizing Provider  aspirin EC 325 MG EC tablet Take 1 tablet (325 mg total) by mouth daily. 11/23/20   Nani Skillern, PA-C  atorvastatin (LIPITOR) 80 MG tablet Take 1 tablet (80 mg total) by mouth daily. 03/10/22   Minna Merritts, MD  blood glucose meter kit and supplies KIT Dispense based on patient and insurance preference. Use up to four times daily as directed. 10/25/21   Patrecia Pour, MD  carvedilol (COREG) 6.25 MG tablet Take 1 tablet (6.25 mg total) by mouth 2 (two) times daily with a meal. 03/10/22   Gollan, Kathlene November, MD  diphenhydramine-acetaminophen (TYLENOL PM) 25-500 MG TABS  tablet Take 2 tablets by mouth at bedtime.    [provider]  ezetimibe (ZETIA) 10 MG tablet Take 1 tablet (10 mg total) by mouth daily. 03/10/22   Minna Merritts, MD  furosemide (LASIX) 40 MG tablet Take 1 tablet (40 mg total) by mouth daily. 03/10/22   Minna Merritts, MD  HYDROcodone-acetaminophen (NORCO/VICODIN) 5-325 MG tablet Take 1 tablet by mouth 3 (three) times daily as needed for moderate pain. 05/07/22   Mcarthur Rossetti, MD  insulin glargine (LANTUS) 100 UNIT/ML Solostar Pen Inject 25 Units into the skin daily. 05/26/22   [name], MD  Insulin Pen Needle 31G X 5 MM MISC Inject insulin via insulin pen daily 10/25/21   Patrecia Pour, MD  isosorbide mononitrate (IMDUR) 30 MG 24 hr tablet Take 1 tablet (30 mg total) by mouth daily. 03/10/22   Minna Merritts, MD  losartan (COZAAR) 25 MG tablet Take 1 tablet (25 mg total) by  mouth daily. 03/10/22   Minna Merritts, MD  metFORMIN (GLUCOPHAGE) 1000 MG tablet TAKE 1 TABLET TWICE A DAY WITH MEALS 10/04/21   [name], MD  Multiple Vitamins-Minerals (MULTIVITAMINS THER. W/MINERALS) TABS Take 1 tablet by mouth every morning.    [provider]  niacin (NIASPAN) 1000 MG CR tablet Take 2 tablets (2000 mg) by mouth once daily at bedtime 03/10/22   Minna Merritts, MD  potassium chloride SA (KLOR-CON M) 20 MEQ tablet Take 1 tablet (20 mEq total) by mouth daily. 03/10/22   Minna Merritts, MD  tirzepatide [facility]) 5 MG/0.5ML Pen Inject 5 mg into the skin once a week. Start after completing the 2.5 mg dose 04/14/22   [name], MD    Physical Exam    Vital Signs:  Jamarrius Salay does not have vital signs available for review today.  Given telephonic nature of communication, physical exam is limited. AAOx3. NAD. Normal affect.  Speech and respirations are unlabored.  Accessory Clinical Findings    None  Assessment & Plan    1.  Preoperative Cardiovascular Risk Assessment:  The patient is doing well from a cardiac perspective. Therefore, based on ACC/AHA guidelines, the patient would be at acceptable risk for the planned procedure without further cardiovascular testing.  According to the Revised Cardiac Risk Index (RCRI), his Perioperative Risk of Major Cardiac Event is (%): 11 His Functional Capacity in METs is: 6.61 according to the [facility] Activity Status Index (DASI).  The patient was advised that if he develops new symptoms prior to surgery to contact our office to arrange for a follow-up visit, and he verbalized understanding.  Per patient, he is planning to hold aspirin for 1 week prior to surgery. I encouraged him to discuss with Dr. Marlou Sa but from a cardiac perspective, he may hold aspirin for 5-7 days prior to procedure and should resume as soon as possible in the postoperative period.   A copy of this note will be routed to requesting surgeon.  Time:   Today, I have spent 10 minutes with the patient with telehealth technology discussing medical history, symptoms, and management plan.     Emmaline Life, NP-C  06/10/2022, 9:01 AM [address] Office [phone] Fax [phone]

## 2022-06-10 ENCOUNTER — Ambulatory Visit: Payer: Medicare Other | Attending: Internal Medicine | Admitting: Nurse Practitioner

## 2022-06-10 ENCOUNTER — Encounter: Payer: Self-pay | Admitting: Nurse Practitioner

## 2022-06-10 DIAGNOSIS — Z01810 Encounter for preprocedural cardiovascular examination: Secondary | ICD-10-CM

## 2022-06-11 ENCOUNTER — Ambulatory Visit: Payer: Medicare Other | Admitting: Dermatology

## 2022-06-27 NOTE — Progress Notes (Addendum)
Surgical Instructions    Your procedure is scheduled on Thursday, Februrary 22, 2024.  Report to [facility] Main Entrance "A" at 5:30 A.M., then check in with the Admitting office.  Call this number if you have problems the morning of surgery:  [phone]   If you have any questions prior to your surgery date call [phone]: Open Monday-Friday 8am-4pm If you experience any cold or flu symptoms such as cough, fever, chills, shortness of breath, etc. between now and your scheduled surgery, please notify us at the above number     Remember:  Do not eat after midnight the night before your surgery  You may drink clear liquids until 4:30 the morning of your surgery.   Clear liquids allowed are: Water, Non-Citrus Juices (without pulp), Carbonated Beverages, Clear Tea, Black Coffee ONLY (NO MILK, CREAM OR POWDERED CREAMER of any kind), and Gatorade  Patient Instructions  The night before surgery:  No food after midnight. ONLY clear liquids after midnight   The day of surgery (if you have diabetes): Drink ONE (1) 12 oz G2 given to you in your pre admission testing appointment by 4:30 the morning of surgery. Drink in one sitting. Do not sip.  This drink was given to you during your hospital  pre-op appointment visit.  Nothing else to drink after completing the  12 oz bottle of G2.         If you have questions, please contact your surgeon's office.     Take these medicines the morning of surgery with A SIP OF WATER:  atorvastatin (LIPITOR)  carvedilol (COREG)  ezetimibe (ZETIA)  isosorbide mononitrate (IMDUR)    As Needed: HYDROcodone-acetaminophen (NORCO/VICODIN)    Follow your surgeon's instructions on when to stop Aspirin.  If no instructions were given by your surgeon then you will need to call the office to get those instructions.     As of today, STOP taking any Aleve, Naproxen, Ibuprofen, Motrin, Advil, Goody's, BC's, all herbal medications, fish oil, and all  vitamins.    WHAT DO I DO ABOUT MY DIABETES MEDICATION?   Do not take oral diabetes medicines (pills) the morning of surgery. Do NOT take metFORMIN (GLUCOPHAGE) the morning of surgery.   HOLD tirzepatide [facility])  for 7 days prior to surgery.  Last dose 06/26/2022.   THE NIGHT BEFORE SURGERY, take 12.5 units (50% of normal dose) of insulin glargine (LANTUS).       THE MORNING OF SURGERY, take 12.5 units (50% of normal dose) of insulin glargine (LANTUS).  The day of surgery, do not take other diabetes injectables, including Byetta (exenatide), Bydureon (exenatide ER), Victoza (liraglutide), or Trulicity (dulaglutide).  If your CBG is greater than 220 mg/dL, you may take  of your sliding scale (correction) dose of insulin.   HOW TO MANAGE YOUR DIABETES BEFORE AND AFTER SURGERY  Why is it important to control my blood sugar before and after surgery? Improving blood sugar levels before and after surgery helps healing and can limit problems. A way of improving blood sugar control is eating a healthy diet by:  Eating less sugar and carbohydrates  Increasing activity/exercise  Talking with your doctor about reaching your blood sugar goals High blood sugars (greater than 180 mg/dL) can raise your risk of infections and slow your recovery, so you will need to focus on controlling your diabetes during the weeks before surgery. Make sure that the doctor who takes care of your diabetes knows about your planned surgery including the date  and location.  How do I manage my blood sugar before surgery? Check your blood sugar at least 4 times a day, starting 2 days before surgery, to make sure that the level is not too high or low.  Check your blood sugar the morning of your surgery when you wake up and every 2 hours until you get to the Short Stay unit.  If your blood sugar is less than 70 mg/dL, you will need to treat for low blood sugar: Do not take insulin. Treat a low blood sugar (less  than 70 mg/dL) with  cup of clear juice (cranberry or apple), 4 glucose tablets, OR glucose gel. Recheck blood sugar in 15 minutes after treatment (to make sure it is greater than 70 mg/dL). If your blood sugar is not greater than 70 mg/dL on recheck, call [phone] for further instructions. Report your blood sugar to the short stay nurse when you get to Short Stay.  If you are admitted to the hospital after surgery: Your blood sugar will be checked by the staff and you will probably be given insulin after surgery (instead of oral diabetes medicines) to make sure you have good blood sugar levels. The goal for blood sugar control after surgery is 80-180 mg/dL.           Do not wear jewelry.. Do not wear lotions, powders, cologne or deodorant. Men may shave face and neck. Do not bring valuables to the hospital.   [facility] is not responsible for any belongings or valuables.    Do NOT Smoke (Tobacco/Vaping)  24 hours prior to your procedure  If you use a CPAP at night, you may bring your mask for your overnight stay.   Contacts, glasses, hearing aids, dentures or partials may not be worn into surgery, please bring cases for these belongings   For patients admitted to the hospital, discharge time will be determined by your treatment team.   Patients discharged the day of surgery will not be allowed to drive home, and someone needs to stay with them for 24 hours.   SURGICAL WAITING ROOM VISITATION Patients having surgery or a procedure may have no more than 2 support people in the waiting area - these visitors may rotate.   Children under the age of 67 must have an adult with them who is not the patient. If the patient needs to stay at the hospital during part of their recovery, the visitor guidelines for inpatient rooms apply. Pre-op nurse will coordinate an appropriate time for 1 support person to accompany patient in pre-op.  This support person may not rotate.   Please refer to  [email or web address] for the visitor guidelines for Inpatients (after your surgery is over and you are in a regular room).    Special instructions:    Oral Hygiene is also important to reduce your risk of infection.  Remember - BRUSH YOUR TEETH THE MORNING OF SURGERY WITH YOUR REGULAR TOOTHPASTE   [place]- Preparing For Surgery  Oral Hygiene is also important to reduce your risk of infection.  Remember - BRUSH YOUR TEETH THE MORNING OF SURGERY WITH YOUR REGULAR TOOTHPASTE    [place]- Preparing for Total Shoulder Arthroplasty  Before surgery, you can play an important role. Because skin is not sterile, your skin needs to be as free of germs as possible. You can reduce the number of germs on your skin by using the following products.   Benzoyl Peroxide Gel  o Reduces the number of  germs present on the skin  o Applied twice a day to shoulder area starting two days before surgery   Chlorhexidine Gluconate (CHG) Soap (instructions listed above on how to wash with CHG Soap)  o An antiseptic cleaner that kills germs and bonds with the skin to continue killing germs even after washing  o Used for showering the night before surgery and morning of surgery   ==================================================================  Please follow these instructions carefully:  BENZOYL PEROXIDE 5% GEL  Please do not use if you have an allergy to benzoyl peroxide. If your skin becomes reddened/irritated stop using the benzoyl peroxide.  Starting two days before surgery, apply as follows:  1. Apply benzoyl peroxide in the morning and at night. Apply after taking a shower. If you are not taking a shower clean entire shoulder front, back, and side along with the armpit with a clean wet washcloth.  2. Place a quarter-sized dollop on your SHOULDER and rub in thoroughly, making sure to cover the front, back, and side of your shoulder, along  with the armpit.   2 Days prior to Surgery First Dose on _____________ Morning Second Dose on ______________ Night  Day Before Surgery First Dose on ______________ Morning Night before surgery wash (entire body except face and private areas) with CHG Soap THEN Second Dose on ____________ Night   Morning of Surgery  wash BODY AGAIN with CHG Soap   4. Do NOT apply benzoyl peroxide gel on the day of surgery    Before surgery, you can play an important role. Because skin is not sterile, your skin needs to be as free of germs as possible. You can reduce the number of germs on your skin by washing with CHG (chlorahexidine gluconate) Soap before surgery.  CHG is an antiseptic cleaner which kills germs and bonds with the skin to continue killing germs even after washing.     Please do not use if you have an allergy to CHG or antibacterial soaps. If your skin becomes reddened/irritated stop using the CHG.  Do not shave (including legs and underarms) for at least 48 hours prior to first CHG shower. It is OK to shave your face.  Please follow these instructions carefully.     Shower the NIGHT BEFORE SURGERY and the MORNING OF SURGERY with CHG Soap.   If you chose to wash your hair, wash your hair first as usual with your normal shampoo. After you shampoo, rinse your hair and body thoroughly to remove the shampoo.  Then [employer] and genitals (private parts) with your normal soap and rinse thoroughly to remove soap.  After that Use CHG Soap as you would any other liquid soap. You can apply CHG directly to the skin and wash gently with a scrungie or a clean washcloth.   Apply the CHG Soap to your body ONLY FROM THE NECK DOWN.  Do not use on open wounds or open sores. Avoid contact with your eyes, ears, mouth and genitals (private parts). Wash Face and genitals (private parts)  with your normal soap.   Wash thoroughly, paying special attention to the area where your surgery will be  performed.  Thoroughly rinse your body with warm water from the neck down.  DO NOT shower/wash with your normal soap after using and rinsing off the CHG Soap.  Pat yourself dry with a CLEAN TOWEL.  8. Apply the Benzoyl Peroxide only the night before surgery.  Do Not use it the morning of surgery.  Wear CLEAN  PAJAMAS to bed the night before surgery  Place CLEAN SHEETS on your bed the night before your surgery  DO NOT SLEEP WITH PETS.   Day of Surgery: Take a shower with CHG soap. Wear Clean/Comfortable clothing the morning of surgery Do not apply any deodorants/lotions.   Remember to brush your teeth WITH YOUR REGULAR TOOTHPASTE.   Please read over the following fact sheets that you were given.

## 2022-06-29 IMAGING — PT NM PET TUM IMG SKULL BASE T - THIGH
7 series · 25 of 25 positions shown · non-contrast
Comparison: Comparison is made with August 25, 2017 and September 08, 2016.

CLINICAL DATA: Prostate cancer, high-risk disease for staging
evaluation in a 70-year-old male.

EXAM:
NUCLEAR MEDICINE PET SKULL BASE TO THIGH
TECHNIQUE: 9.8 mCi F18 Piflufolastat (Pylarify) was injected intravenously.
Full-ring PET imaging was performed from the skull base to thigh
after the radiotracer. CT data was obtained and used for attenuation
correction and anatomic localization.

[Series 3: pet sk_thigh ac · axial · 5.0mm · 4.07mm/px · z∈[-902,+114]mm · 6 of 255 slices shown]
[im 1/255]
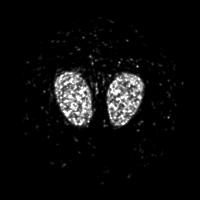
[im 51/255]
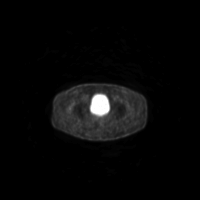
[im 102/255]
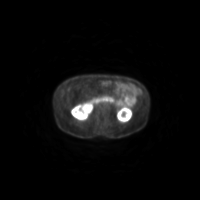
[im 153/255]
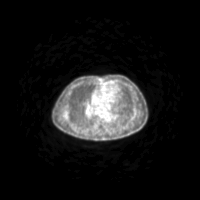
[im 204/255]
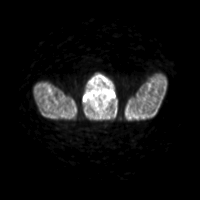
[im 255/255]
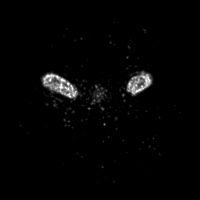

[Series 4: ct sk_thigh 5.0 bf37 · axial · 5.0mm · 0.98mm/px · z∈[-902,+114]mm · 6 of 255 slices shown]
[im 1/255]
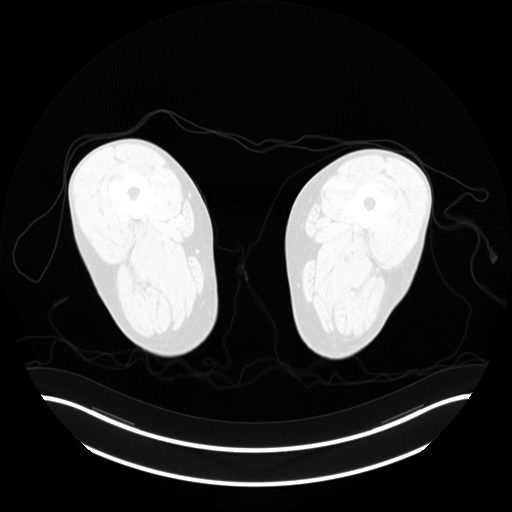
[im 51/255]
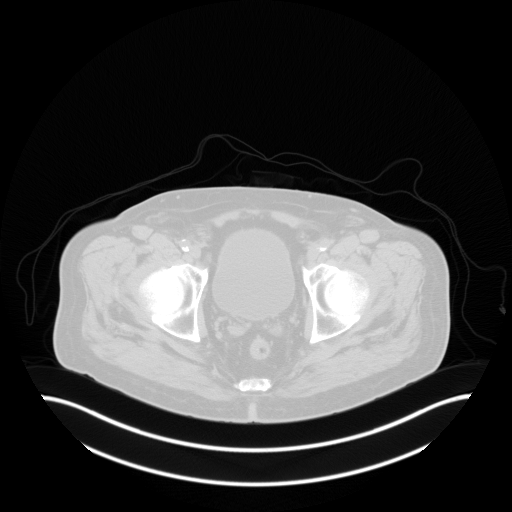
[im 102/255]
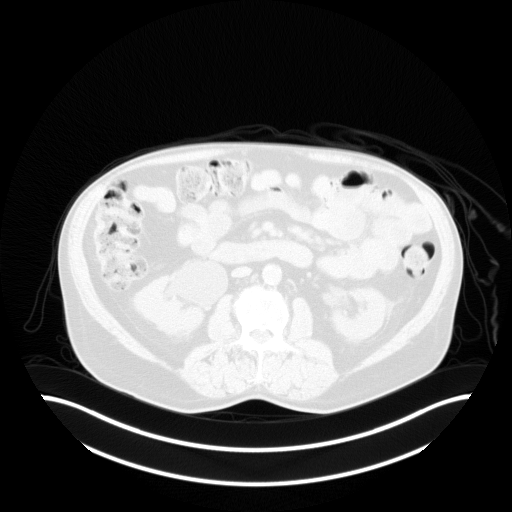
[im 153/255]
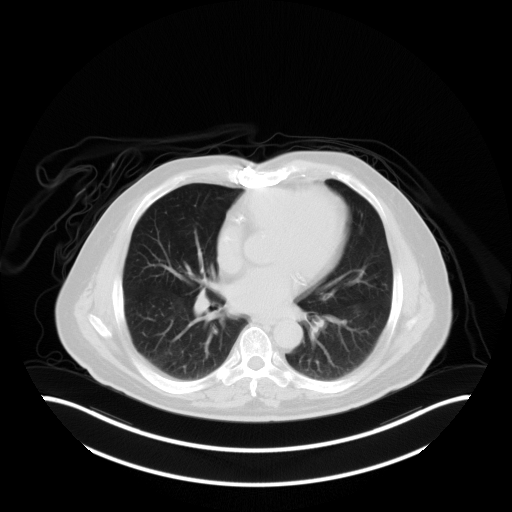
[im 204/255  brain]
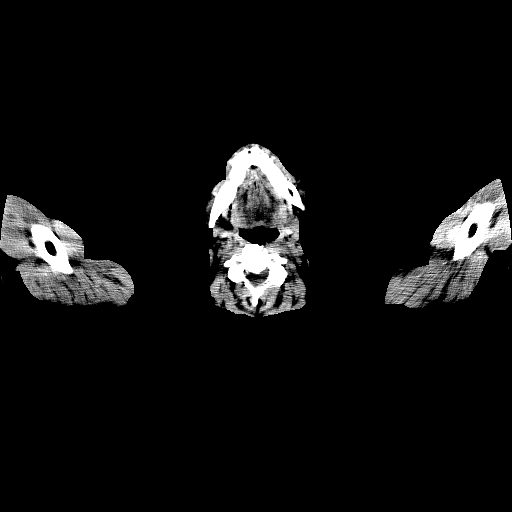
[im 255/255]
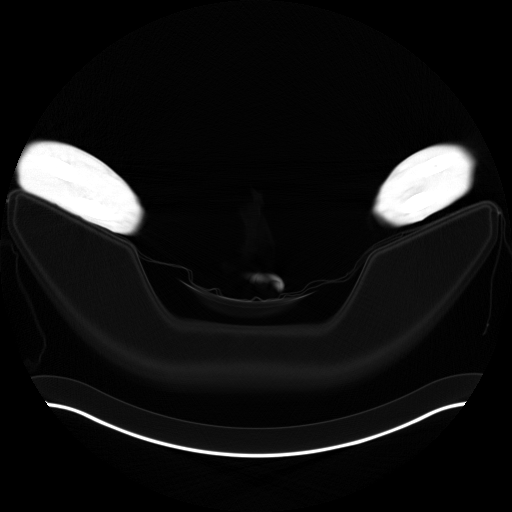

[Series 5: pet sk_thigh nac · axial · 5.0mm · 4.07mm/px · z∈[-902,+114]mm · 5 of 255 slices shown]
[im 1/255]
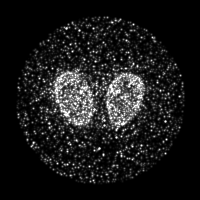
[im 64/255]
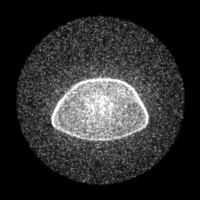
[im 128/255]
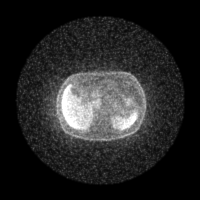
[im 191/255]
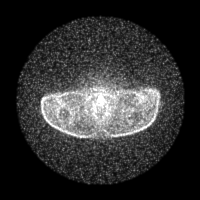
[im 255/255]
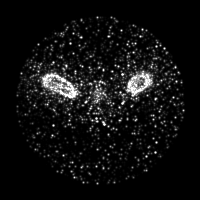

[Series 8: ct sk_thigh 5.0 br59 lung_bone · axial · 5.0mm · 0.70mm/px · 1 of 63 slices shown]
[im 1/63]
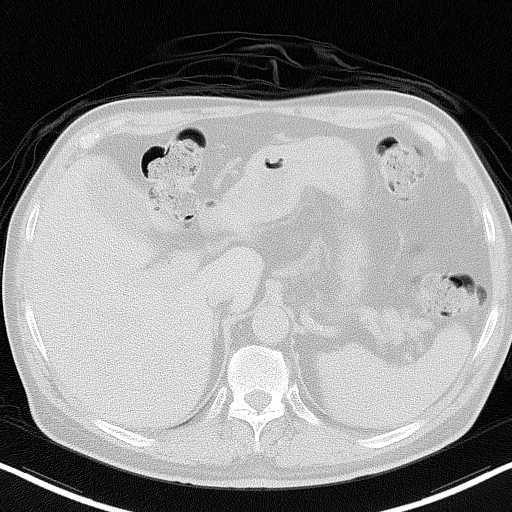

[Series 603: fused cor · 1 of 58 slices shown]
[im 1/58]
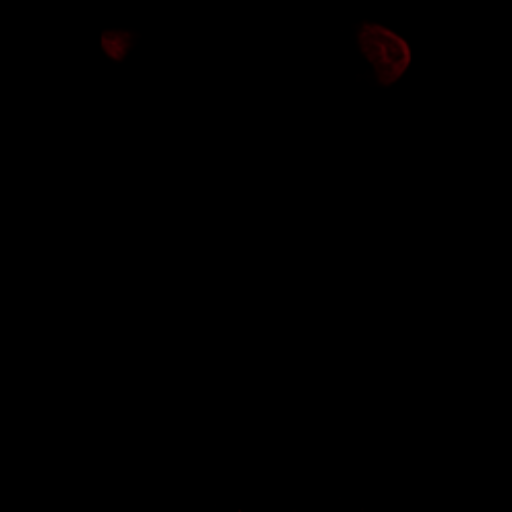

[Series 604: <mip collection> · coronal · 2.11mm/px · 1 of 32 slices shown]
[im 1/32]
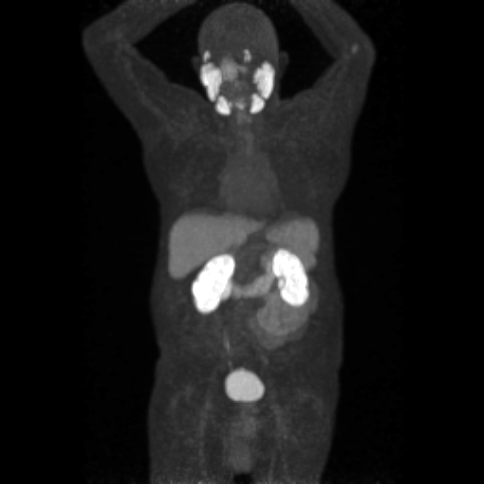

[Series 605: range-ct sk_thigh 5.0 bf37-tra-<alpha range> · 5 of 249 slices shown]
[im 1/249]
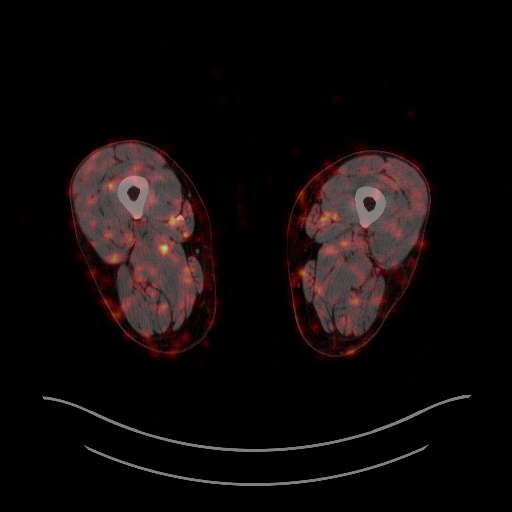
[im 63/249]
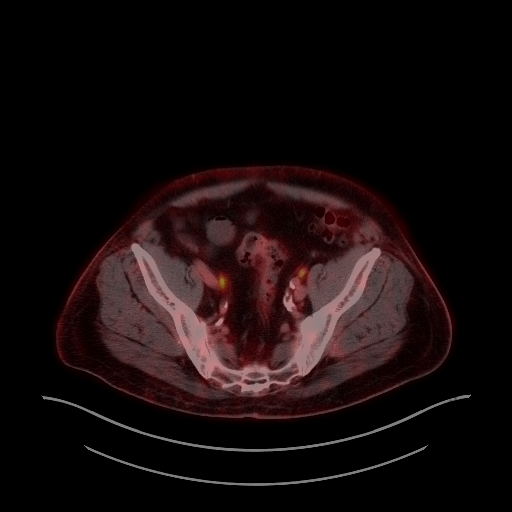
[im 125/249]
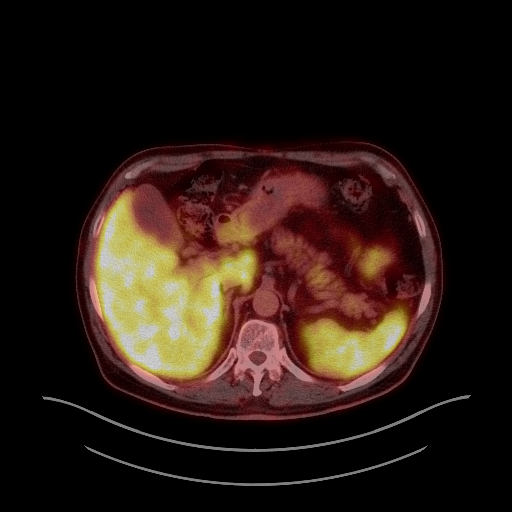
[im 187/249]
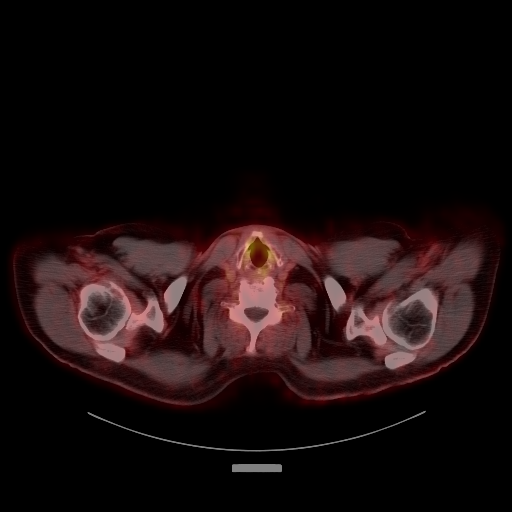
[im 249/249]
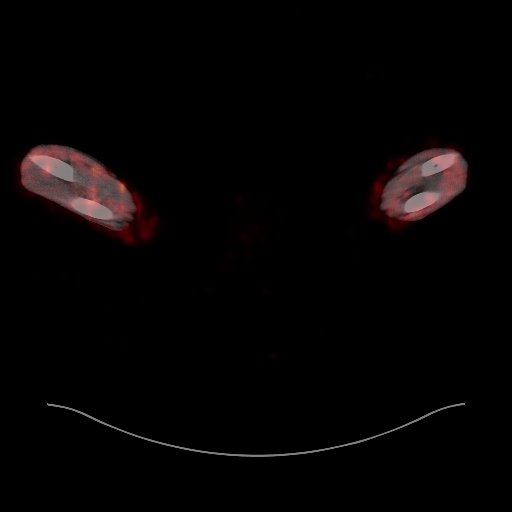

[25 of 25 positions shown; findings below may reference images not displayed]

FINDINGS: NECK

No radiotracer activity in neck lymph nodes.

Incidental CT finding: None

CHEST

No radiotracer accumulation within mediastinal or hilar lymph nodes.
No suspicious pulmonary nodules on the CT scan.

Incidental CT finding: Calcified atheromatous plaque of the thoracic
aorta. No aneurysmal dilation. Three-vessel coronary artery disease.
Signs of prior percutaneous coronary intervention and CABG with
median sternotomy changes. No pleural or pericardial effusion. No
consolidation. No suspicious pulmonary nodules.

ABDOMEN/PELVIS

Prostate: Subtle nonspecific activity in the posterior prostate
maximum SUV of 3.06, asymmetric, only mildly different from other
areas of mild activity throughout the gland.

Lymph nodes: No abnormal radiotracer accumulation within pelvic or
abdominal nodes.

Liver: No evidence of liver metastasis

Incidental CT finding: No acute findings relative liver,
gallbladder, pancreas, spleen, adrenal glands or kidneys.

LEFT adrenal adenoma measuring 2.2 cm stable dating back to 8249.

Moderate RIGHT UPJ obstruction improved over 1657 but increased over
8249. Some associated infundibular dilation. No perivesical
stranding. Mild cortical scarring of the bilateral kidneys actually
worse on the LEFT where there is no sign of UPJ obstruction with
nephrolithiasis on the LEFT, a 4 mm interpolar calculus.

No sign of acute gastrointestinal process. The appendix is normal.
Colonic diverticulosis. Aortic atherosclerosis. No adenopathy by
size criteria in the abdomen or in the pelvis.

SKELETON

No focal  activity to suggest skeletal metastasis.
IMPRESSION: Mildly heterogeneous uptake in the prostate. Nonspecific in this
patient with reported history of prostate cancer. No signs of
distant disease or nodal enlargement.

RIGHT UPJ obstruction appears worse than in 8249 but not as
pronounced as in 1657. Correlate with any RIGHT-sided symptoms.

LEFT nephrolithiasis.

LEFT adrenal adenoma.

Aortic atherosclerosis

Other incidental findings as outlined above.

Aortic Atherosclerosis ([id number]).

## 2022-06-30 ENCOUNTER — Encounter: Payer: Self-pay | Admitting: Internal Medicine

## 2022-06-30 ENCOUNTER — Telehealth: Payer: Self-pay | Admitting: Family Medicine

## 2022-06-30 ENCOUNTER — Encounter (HOSPITAL_COMMUNITY): Payer: Self-pay | Admitting: Physician Assistant

## 2022-06-30 ENCOUNTER — Encounter (HOSPITAL_COMMUNITY): Payer: Self-pay

## 2022-06-30 ENCOUNTER — Other Ambulatory Visit: Payer: Self-pay

## 2022-06-30 ENCOUNTER — Telehealth: Payer: Self-pay

## 2022-06-30 ENCOUNTER — Ambulatory Visit (INDEPENDENT_AMBULATORY_CARE_PROVIDER_SITE_OTHER): Payer: Medicare Other | Admitting: Internal Medicine

## 2022-06-30 ENCOUNTER — Encounter (HOSPITAL_COMMUNITY)
Admission: RE | Admit: 2022-06-30 | Discharge: 2022-06-30 | Disposition: A | Discharge status: Home / Self Care | Payer: Medicare Other | Source: Ambulatory Visit | Attending: Orthopedic Surgery | Admitting: Orthopedic Surgery

## 2022-06-30 VITALS — BP 135/60 | HR 78 | Temp 97.9°F | Resp 18 | Ht 68.0 in | Wt 189.0 lb

## 2022-06-30 VITALS — BP 110/62 | HR 78 | Temp 98.0°F | Ht 68.0 in | Wt 191.2 lb

## 2022-06-30 DIAGNOSIS — E1165 Type 2 diabetes mellitus with hyperglycemia: Secondary | ICD-10-CM | POA: Insufficient documentation

## 2022-06-30 DIAGNOSIS — Z01812 Encounter for preprocedural laboratory examination: Secondary | ICD-10-CM | POA: Insufficient documentation

## 2022-06-30 DIAGNOSIS — Z01818 Encounter for other preprocedural examination: Secondary | ICD-10-CM

## 2022-06-30 LAB — CBC
HCT: 41.1 % (ref 39.0–52.0)
Hemoglobin: 13.8 g/dL (ref 13.0–17.0)
MCH: 31 pg (ref 26.0–34.0)
MCHC: 33.6 g/dL (ref 30.0–36.0)
MCV: 92.4 fL (ref 80.0–100.0)
Platelets: 130 10*3/uL — ABNORMAL LOW (ref 150–400)
RBC: 4.45 MIL/uL (ref 4.22–5.81)
RDW: 12.5 % (ref 11.5–15.5)
WBC: 4.7 10*3/uL (ref 4.0–10.5)
nRBC: 0 % (ref 0.0–0.2)

## 2022-06-30 LAB — COMPREHENSIVE METABOLIC PANEL
ALT: 42 U/L (ref 0–44)
AST: 50 U/L — ABNORMAL HIGH (ref 15–41)
Albumin: 3.9 g/dL (ref 3.5–5.0)
Alkaline Phosphatase: 138 U/L — ABNORMAL HIGH (ref 38–126)
Anion gap: 11 (ref 5–15)
BUN: 18 mg/dL (ref 8–23)
CO2: 26 mmol/L (ref 22–32)
Calcium: 9.1 mg/dL (ref 8.9–10.3)
Chloride: 96 mmol/L — ABNORMAL LOW (ref 98–111)
Creatinine, Ser: 1.19 mg/dL (ref 0.61–1.24)
GFR, Estimated: >60 mL/min (ref >60)
Glucose, Bld: 625 mg/dL (ref 70–99)
Potassium: 4.7 mmol/L (ref 3.5–5.1)
Sodium: 133 mmol/L — ABNORMAL LOW (ref 135–145)
Total Bilirubin: 2.2 mg/dL — ABNORMAL HIGH (ref 0.3–1.2)
Total Protein: 6.4 g/dL — ABNORMAL LOW (ref 6.5–8.1)

## 2022-06-30 LAB — URINALYSIS, W/ REFLEX TO CULTURE (INFECTION SUSPECTED)
Bacteria, UA: NONE SEEN
Bilirubin Urine: NEGATIVE
Glucose, UA: >=500 mg/dL — AB
Hgb urine dipstick: NEGATIVE
Ketones, ur: NEGATIVE mg/dL
Leukocytes,Ua: NEGATIVE
Nitrite: NEGATIVE
Protein, ur: NEGATIVE mg/dL
Specific Gravity, Urine: <1.005 — ABNORMAL LOW (ref 1.005–1.030)
pH: 6.5 (ref 5.0–8.0)

## 2022-06-30 LAB — GLUCOSE, CAPILLARY
Glucose-Capillary: 587 mg/dL (ref 70–99)
Glucose-Capillary: 561 mg/dL (ref 70–99)

## 2022-06-30 LAB — SURGICAL PCR SCREEN
MRSA, PCR: NEGATIVE
Staphylococcus aureus: NEGATIVE

## 2022-06-30 LAB — HEMOGLOBIN A1C
Hgb A1c MFr Bld: 12.1 % — ABNORMAL HIGH (ref 4.8–5.6)
Mean Plasma Glucose: 300.57 mg/dL

## 2022-06-30 MED ORDER — INSULIN ASPART (W/NIACINAMIDE) 100 UNIT/ML ~~LOC~~ SOPN: PEN_INJECTOR | SUBCUTANEOUS | 1 refills | Status: DC

## 2022-06-30 MED ORDER — FREESTYLE LIBRE 3 SENSOR MISC: 1 refills | Status: DC

## 2022-06-30 NOTE — Telephone Encounter (Signed)
error 

## 2022-06-30 NOTE — Telephone Encounter (Signed)
-----   Message from Meredith Pel, MD sent at 06/30/2022 10:20 AM EST ----- Pls have him see primary care doctor today or go to er may be going into dka needs eval today thx surgery unlikely for Thursday with these numbers ----- Message ----- From: Interface, Lab In [place] Sent: 06/30/2022   8:10 AM EST To: Meredith Pel, MD

## 2022-06-30 NOTE — Telephone Encounter (Signed)
Yes.  Glucose too high and I do not really see a getting fixed by Thursday.  Especially if he does not see someone today.

## 2022-06-30 NOTE — Progress Notes (Signed)
Subjective:  Patient ID: [name], male    DOB: 1951/01/14  Age: 72 y.o. MRN: [id number]  CC: The encounter diagnosis was Uncontrolled type 2 diabetes mellitus with hyperglycemia, without long-term current use of insulin ([place]).   HPI [name] presents for  Chief Complaint  Patient presents with   Medical Management of Chronic Issues    Elevated blood sugar   Erlon is a 72 yr old male with type 2 DM last seen by his PCP Dr Caryl Bis in November 2023.  Lantus dose was 25 units, trulicity 3 units .  Trulicity was changed to [facility] after A1c in November had increased from 8.3 to 10.3  .  Darcel Bayley was not authorized at last increase  .  Last dose was 2 months ago.  .and ran out of trulicity  He was scheduled for shoulder surgery but preoperative =evaluation today included an A1c and BMP.  A1c was 12 and glucose was 500. He has lost 5 lbs since November.   Has not used a glucose meter.   Has a smart phone   Diet not good  ;  lots of carbs "I'm hooked on carbs"  breakfast   bread /meat  (today had 4 slices)    Lunch is  meat and potatoes .  Same for dinner.  Snacks on vegetable crisps,  that are crunchy  .  No soda.  No ice cream .   He  is not exercising since injured shoulder  several years ago.  Walks  intermittently    Outpatient Medications Prior to Visit  Medication Sig Dispense Refill   aspirin EC 325 MG EC tablet Take 1 tablet (325 mg total) by mouth daily. 30 tablet 0   atorvastatin (LIPITOR) 80 MG tablet Take 1 tablet (80 mg total) by mouth daily. 90 tablet 3   blood glucose meter kit and supplies KIT Dispense based on patient and insurance preference. Use up to four times daily as directed. 1 each 0   carvedilol (COREG) 6.25 MG tablet Take 1 tablet (6.25 mg total) by mouth 2 (two) times daily with a meal. 180 tablet 3   ezetimibe (ZETIA) 10 MG tablet Take 1 tablet (10 mg total) by mouth daily. 90 tablet 3   furosemide (LASIX) 40 MG tablet Take 1 tablet (40 mg  total) by mouth daily. 90 tablet 3   insulin glargine (LANTUS) 100 UNIT/ML Solostar Pen Inject 25 Units into the skin daily. 15 mL 1   Insulin Pen Needle 31G X 5 MM MISC Inject insulin via insulin pen daily 50 each 0   isosorbide mononitrate (IMDUR) 30 MG 24 hr tablet Take 1 tablet (30 mg total) by mouth daily. 90 tablet 3   losartan (COZAAR) 25 MG tablet Take 1 tablet (25 mg total) by mouth daily. 90 tablet 3   metFORMIN (GLUCOPHAGE) 1000 MG tablet TAKE 1 TABLET TWICE A DAY WITH MEALS 180 tablet 3   Multiple Vitamins-Minerals (MULTIVITAMINS THER. W/MINERALS) TABS Take 1 tablet by mouth every morning.     niacin (NIASPAN) 1000 MG CR tablet Take 2 tablets (2000 mg) by mouth once daily at bedtime 180 tablet 3   NON FORMULARY Take 1-2 tablets by mouth daily as needed (Pain). CBD Gummy     potassium chloride SA (KLOR-CON M) 20 MEQ tablet Take 1 tablet (20 mEq total) by mouth daily. 90 tablet 3   HYDROcodone-acetaminophen (NORCO/VICODIN) 5-325 MG tablet Take 1 tablet by mouth 3 (three) times daily as needed  for moderate pain. (Patient not taking: Reported on 06/25/2022) 30 tablet 0   tirzepatide (MOUNJARO) 5 MG/0.5ML Pen Inject 5 mg into the skin once a week. Start after completing the 2.5 mg dose (Patient not taking: Reported on 06/30/2022) 6 mL 3   No facility-administered medications prior to visit.    Review of Systems;  Patient denies headache, fevers, malaise, unintentional weight loss, skin rash, eye pain, sinus congestion and sinus pain, sore throat, dysphagia,  hemoptysis , cough, dyspnea, wheezing, chest pain, palpitations, orthopnea, edema, abdominal pain, nausea, melena, diarrhea, constipation, flank pain, dysuria, hematuria, urinary  Frequency, nocturia, numbness, tingling, seizures,  Focal weakness, Loss of consciousness,  Tremor, insomnia, depression, anxiety, and suicidal ideation.      Objective:  BP 110/62   Pulse 78   Temp 98 F (36.7 C) (Oral)   Ht 5' 8"$  (1.727 m)   Wt 191 lb  3.2 oz (86.7 kg)   SpO2 98%   BMI 29.07 kg/m   BP Readings from Last 3 Encounters:  06/30/22 110/62  06/30/22 135/60  04/02/22 118/74    Wt Readings from Last 3 Encounters:  06/30/22 191 lb 3.2 oz (86.7 kg)  06/30/22 189 lb (85.7 kg)  04/02/22 196 lb 12.8 oz (89.3 kg)    Physical Exam Vitals reviewed.  Constitutional:      General: He is not in acute distress.    Appearance: Normal appearance. He is normal weight. He is not ill-appearing, toxic-appearing or diaphoretic.  HENT:     Head: Normocephalic.  Eyes:     General: No scleral icterus.       Right eye: No discharge.        Left eye: No discharge.     Conjunctiva/sclera: Conjunctivae normal.  Cardiovascular:     Rate and Rhythm: Normal rate and regular rhythm.     Heart sounds: Normal heart sounds.  Pulmonary:     Effort: Pulmonary effort is normal. No respiratory distress.     Breath sounds: Normal breath sounds.  Musculoskeletal:        General: Normal range of motion.     Cervical back: Normal range of motion.  Skin:    General: Skin is warm and dry.  Neurological:     General: No focal deficit present.     Mental Status: He is alert and oriented to person, place, and time. Mental status is at baseline.  Psychiatric:        Mood and Affect: Mood normal.        Behavior: Behavior normal.        Thought Content: Thought content normal.        Judgment: Judgment normal.    Lab Results  Component Value Date   HGBA1C 12.1 (H) 06/30/2022   HGBA1C 10.3 (H) 04/02/2022   HGBA1C 8.3 (H) 12/31/2021    Lab Results  Component Value Date   CREATININE 1.19 06/30/2022   CREATININE 1.20 04/02/2022   CREATININE 0.85 12/31/2021    Lab Results  Component Value Date   WBC 4.7 06/30/2022   HGB 13.8 06/30/2022   HCT 41.1 06/30/2022   PLT 130 (L) 06/30/2022   GLUCOSE 625 (HH) 06/30/2022   CHOL 89 04/02/2022   TRIG 96 04/02/2022   HDL 35 (L) 04/02/2022   LDLDIRECT 71.0 07/11/2019   LDLCALC 36 04/02/2022   ALT  42 06/30/2022   AST 50 (H) 06/30/2022   NA 133 (L) 06/30/2022   K 4.7 06/30/2022   CL 96 (L)  06/30/2022   CREATININE 1.19 06/30/2022   BUN 18 06/30/2022   CO2 26 06/30/2022   TSH 0.919 11/22/2014   PSA 7.37 (H) 07/02/2021   INR 1.3 (H) 11/20/2020   HGBA1C 12.1 (H) 06/30/2022   MICROALBUR 0.6 04/02/2022    No results found.  Assessment & Plan:  .Uncontrolled type 2 diabetes mellitus with hyperglycemia, without long-term current use of insulin ([place]) Assessment & Plan: Secondary to lapse in medication.  Has been without Mounjaro and Trulicity for over a month, still taking lantus 25 units.  Will increase dose to 35 units , adding Aspart 5 units q meal.  Samples of aspart and basaglar given. As well as CBG sensor [employer] 3 .  Return in one week  for review of glycemic control .  Dietary advice given.   Orders: -     AMB Referral to Pharmacy Medication Management  Other orders -     Insulin Aspart (w/Niacinamide); 5 units three times daily before meals  Dispense: 3 mL; Refill: 1 -     FreeStyle Libre 3 Sensor; Place 1 sensor on the skin every 14 days. Use to check glucose continuously  Dispense: 6 each; Refill: 1     I provided 40 minutes of face-to-face time during this encounter reviewing patient's last visit with me, patient's  most recent visit with cardiology,  nephrology,  and neurology,  recent surgical and non surgical procedures, previous  labs and imaging studies, counseling on currently addressed issues,  and post visit ordering to diagnostics and therapeutics .   Follow-up: Return in about 1 week (around 07/07/2022) for follow up diabetes.   Crecencio Mc, MD

## 2022-06-30 NOTE — Patient Instructions (Addendum)
Increase lantus to 35 units starting tomorrow  Start using the Fiasp (aspart) short acting insulin 5 units   before  breakfast , lunch and dinner    Start walking daily for exercise   this will help increase your body's sensitivity to the insulin it is making   I am contacting our Pharm D to help get your medications for less $$$$  RETURN IN  ONE WEEK TO REVIEW BLOOD SUGARS AND ADJUST DOSING

## 2022-06-30 NOTE — Progress Notes (Addendum)
Spoke with Isaac Caldwell, PA-C re: patients Blood Sugar of 587 at PAT appointment.  Requested labs be drawn now, change BMP to CMP, give patient water and re-check sugar before patient leaves.  Patient ate breakfast and did not take his insulin. Patient is also out of his Metformin.   0900, repeat blood sugar 561. [name] made aware. Patient to go home and take his insulin. [name] will call Dr. Randel Pigg office to make them aware.

## 2022-06-30 NOTE — Assessment & Plan Note (Signed)
Secondary to lapse in medication.  Has been without Mounjaro and Trulicity for over a month, still taking lantus 25 units.  Will increase dose to 35 units , adding Aspart 5 units q meal.  Samples of aspart and basaglar given. As well as CBG sensor [employer] 3 .  Return in one week  for review of glycemic control .  Dietary advice given.

## 2022-06-30 NOTE — Progress Notes (Addendum)
PCP - Dr. [name] Cardiologist - Dr. [name]  PPM/ICD -  Denies  Chest x-ray - 12/24/2020 EKG - 03/10/2022 Stress Test - greater than 10 years ago  ECHO - 06/27/2020 Cardiac Cath - 11/15/2020  Sleep Study -  + for sleep apnea CPAP - does not know settings  Type II diabetic. Hold Metformin the morning of surgery.   Fasting Blood Sugar - <200 Checks Blood Sugar _1____ times a day, "when I remember"  Last dose of GLP1 agonist-  Mounjaro, hold 7 days prior to surgery with last dose 06/25/2022.   GLP1 instructions:   Blood Thinner Instructions: Denies Aspirin Instructions: Follow Surgeons directions.   ERAS Protcol - Yes PRE-SURGERY G2-   COVID TEST- Denies   Anesthesia review: Yes, 7 stents, CAD, OSA with CPAP, Type II diabetes.  Cardiologist clearance received 06/10/2022 from Christen Bame, NP with [facility].  Primary Cardiologist Dr. [name]  Patient denies shortness of breath, fever, cough and chest pain at PAT appointment   All instructions explained to the patient, with a verbal understanding of the material. Patient agrees to go over the instructions while at home for a better understanding. Patient also instructed to self quarantine after being tested for COVID-19. The opportunity to ask questions was provided.

## 2022-06-30 NOTE — Telephone Encounter (Signed)
I called cbg too high

## 2022-06-30 NOTE — Progress Notes (Signed)
Received critical glucose of 625 from Azucena Cecil in Lab.  Karoline Caldwell PA-C made aware.

## 2022-06-30 NOTE — Telephone Encounter (Signed)
IC s/w patient spouse whom answered the phone-she stated patient did not rest well last night and he was in the bed asleep. I advised she needed to wake him and needed to seek treatment per Dr Forbes Cellar note-Let her know also not likely would be able to proceed with surgery based off of these labs. Dr Marlou Sa please confirm if we can cancel patient surgery so we know how to proceed.

## 2022-07-01 ENCOUNTER — Telehealth: Payer: Self-pay

## 2022-07-01 NOTE — Telephone Encounter (Signed)
Medication Samples have been provided to the patient.  Drug name: Claiborne Billings       Strength: U-100        Qty: 2 boxes  LOT: [id number]  Exp.Date: 07/10/2022  Dosing instructions: Inject 5 units into skin three times daily.   The patient has been instructed regarding the correct time, dose, and frequency of taking this medication, including desired effects and most common side effects.   Gustav Knueppel 11:34 AM 07/01/2022   Medication Samples have been provided to the patient.  Drug name: Basaglar       Strength: U-100        Qty: 1 box  LOT: [id number]  Exp.Date: 07/19/2023  Dosing instructions: when done with lantus start using 35 units daily.   The patient has been instructed regarding the correct time, dose, and frequency of taking this medication, including desired effects and most common side effects.   Evaline Waltman 11:35 AM 07/01/2022

## 2022-07-01 NOTE — Progress Notes (Signed)
   Care Guide Note  07/01/2022 Name: [name] MRN: [id number] DOB: 07-03-1950  Referred by: [name], MD Reason for referral : Care Coordination (Outreach to schedule referral with pharm d )   [name] is a 72 y.o. year old male who is a primary care patient of Caryl Bis, Angela Adam, MD. [name] was referred to the pharmacist for assistance related to DM.    An unsuccessful telephone outreach was attempted today to contact the patient who was referred to the pharmacy team for assistance with medication management. Additional attempts will be made to contact the patient.   Noreene Larsson, [place], [place] [address] Direct Dial: [phone] [email or web address]

## 2022-07-02 ENCOUNTER — Telehealth: Payer: Self-pay | Admitting: Orthopedic Surgery

## 2022-07-02 MED ORDER — HYDROCODONE-ACETAMINOPHEN 5-325 MG PO TABS: ORAL_TABLET | ORAL | 0 refills | Status: DC

## 2022-07-02 NOTE — Telephone Encounter (Signed)
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  Patient has had a visit with PCP since his pre-op visit and labs.  Patient is requesting pain prescription that can tie him over until he has his surgery.  [facility] [place] [facility]   Patient's callback is [phone]

## 2022-07-02 NOTE — Telephone Encounter (Signed)
Tried calling to advise sent-no answer.

## 2022-07-03 ENCOUNTER — Encounter (HOSPITAL_COMMUNITY): Admission: RE | Payer: Self-pay | Source: Home / Self Care

## 2022-07-03 ENCOUNTER — Ambulatory Visit (HOSPITAL_COMMUNITY): Admission: RE | Admit: 2022-07-03 | Payer: Medicare Other | Source: Home / Self Care | Admitting: Orthopedic Surgery

## 2022-07-03 DIAGNOSIS — Z01818 Encounter for other preprocedural examination: Secondary | ICD-10-CM

## 2022-07-03 SURGERY — ARTHROPLASTY, SHOULDER, TOTAL, REVERSE
Anesthesia: General | Site: Shoulder | Laterality: Right

## 2022-07-07 ENCOUNTER — Telehealth: Payer: Self-pay

## 2022-07-07 ENCOUNTER — Encounter: Payer: Self-pay | Admitting: Cardiovascular Disease

## 2022-07-07 ENCOUNTER — Other Ambulatory Visit (HOSPITAL_COMMUNITY): Payer: Self-pay

## 2022-07-07 NOTE — Telephone Encounter (Signed)
Pharmacy Patient Advocate Encounter   Received fax from [employer] that prior authorization for [facility] 3 sensor is required/requested.  Per Test Claim: PA required   Faxed completed form to [employer] [phone]. Case# [id number]  Status is pending

## 2022-07-07 NOTE — Telephone Encounter (Signed)
LMTCB. Need to schedule pt for a follow up with Dr. Derrel Nip this week or early next week.

## 2022-07-09 ENCOUNTER — Telehealth: Payer: Self-pay | Admitting: Pharmacist

## 2022-07-09 NOTE — Telephone Encounter (Signed)
Pt working w/ [employer]

## 2022-07-09 NOTE — Progress Notes (Unsigned)
Contacted patient regarding referral for diabetes from [name], MD .   Left patient a voicemail to return my call at their [place], PharmD, [place], [place] [phone]

## 2022-07-09 NOTE — Telephone Encounter (Signed)
Pharmacy Patient Advocate Encounter  Received notification from [employer] that the request for prior authorization for Freestyle Libre 3 sensor  has been denied due to no documentation to confirm completion of a comprehensive diabetes education program.    Please be advised we currently do not have a Pharmacist to review denials, therefore you will need to process appeals accordingly as needed. Thanks for your support at this time.   Denial letter attached to charts

## 2022-07-10 NOTE — Telephone Encounter (Signed)
LMTCB

## 2022-07-11 ENCOUNTER — Other Ambulatory Visit: Payer: Medicare Other | Admitting: Pharmacist

## 2022-07-11 NOTE — Progress Notes (Signed)
07/11/2022 Name: [name] MRN: [id number] DOB: [birth year]  Chief Complaint  Patient presents with   Medication Management   Hypertension   Diabetes    [name] is a 72 y.o. year old male who presented for a telephone visit.   They were referred to the pharmacist by their PCP for assistance in managing diabetes.    Subjective:  Care Team: Primary Care Provider: [name], MD ; Next Scheduled Visit: not scheduled  Medication Access/Adherence  Current Pharmacy:  [place], [place] [address] [place] [address] Phone: [phone] Fax: [phone]  [id number] [id number]: [phone]: [phone] [id number] [place]: [phone]: [phone] Patient reports affordability concerns with their medications: Yes  Patient reports access/transportation concerns to their pharmacy: No  Patient reports adherence concerns with their medications:  Yes     Diabetes:  Current medications: metformin 1000 mg twice daily, Mounjaro prescribed, but never received. Previously was on Trulicity with an inability to obtain goal A1c <7%; Lantus 25 units daily; Fiasp 5 units three days before meals  Has been out of metformin for about a week due to not having his copay.   Reports that he has been using insulin consistently. Has been wearing a sample Libre 3 sensor to assist in evaluation and management. Able to note several nutritional patterns that result in elevating readings that he is working on changing.   Date of Download: 06/28/22-07/11/22 % Time CGM is active: 76% Average Glucose: 315 mg/dL Glucose Management Indicator: 10.8  Glucose Variability: 21.4 (goal <36%) Time in Goal:  - Time in  range 70-180: 3% - Time above range: 97% - Time below range: 0%    Hypertension:  Current medications: losartan 25 mg daily, carvedilol 6.25 mg twice daily, isosorbide 30 mg daily  Also taking furosemide 40 mg daily + potassium 20 mEq daily   Hyperlipidemia/ASCVD Risk Reduction  Current lipid lowering medications: atorvastatin 80 mg daily, ezetimibe 10 mg daily, niacin 2000 mg daily  Antiplatelet regimen: aspirin 325 mg daily   Does report significant flushing with niacin. Reports he purposely takes before bedtime in hopes that he falls asleep before the flushing starts. Appears he has been on this medication since the beginning of electronic records. Highest triglycerides appear to have been ~ 250 (though likely on niacin at this time)  Ezetimibe has not been filled since October.   Objective:  Lab Results  Component Value Date   HGBA1C 12.1 (H) 06/30/2022    Lab Results  Component Value Date   CREATININE 1.19 06/30/2022   BUN 18 06/30/2022   NA 133 (L) 06/30/2022   K 4.7 06/30/2022   CL 96 (L) 06/30/2022   CO2 26 06/30/2022    Lab Results  Component Value Date   CHOL 89 04/02/2022   HDL 35 (L) 04/02/2022   LDLCALC 36 04/02/2022   LDLDIRECT 71.0 07/11/2019   TRIG 96 04/02/2022   CHOLHDL 2.5 04/02/2022    Medications Reviewed Today     Reviewed by HaOsker MasonRPH-CPP (Pharmacist) on 07/11/22 at [phone] List Status: <None>   Medication Order Taking? Sig Documenting Provider Last Dose Status Informant  aspirin EC 325 MG EC tablet [id number] 1  tablet (325 mg total) by mouth daily. Nani Skillern, PA-C  Active Self  atorvastatin (LIPITOR) 80 MG tablet [id number] Yes Take 1 tablet (80 mg total) by mouth daily. Minna Merritts, MD Taking Active Self  blood glucose meter kit and supplies KIT [id number] Yes Dispense based on patient and insurance preference. Use up to four times daily as directed. Patrecia Pour, MD Taking Active Self   carvedilol (COREG) 6.25 MG tablet [id number] Yes Take 1 tablet (6.25 mg total) by mouth 2 (two) times daily with a meal. Gollan, Kathlene November, MD Taking Active Self  Continuous Blood Gluc Sensor (FREESTYLE LIBRE 3 SENSOR) [place] [id number] Yes Place 1 sensor on the skin every 14 days. Use to check glucose continuously Crecencio Mc, MD Taking Active   ezetimibe (ZETIA) 10 MG tablet [id number] Yes Take 1 tablet (10 mg total) by mouth daily. Minna Merritts, MD Taking Active Self  furosemide (LASIX) 40 MG tablet [id number] Yes Take 1 tablet (40 mg total) by mouth daily. Minna Merritts, MD Taking Active Self  HYDROcodone-acetaminophen (NORCO/VICODIN) 5-325 MG tablet [id number] Yes 1 po bid prn Meredith Pel, MD Taking Active   insulin aspart (FIASP) 100 UNIT/ML FlexTouch Pen [id number] Yes 5 units three times daily before meals Crecencio Mc, MD Taking Active   insulin glargine (LANTUS) 100 UNIT/ML Solostar Pen [id number] Yes Inject 25 Units into the skin daily. [name], MD Taking Active Self           Med Note Jodi Mourning, Grace Bushy   Fri Jul 11, 2022  9:38 AM) 35  Insulin Pen Needle 31G X 5 MM MISC [id number] Yes Inject insulin via insulin pen daily Patrecia Pour, MD Taking Active Self  isosorbide mononitrate (IMDUR) 30 MG 24 hr tablet [id number] Yes Take 1 tablet (30 mg total) by mouth daily. Minna Merritts, MD Taking Active Self  losartan (COZAAR) 25 MG tablet [id number] Yes Take 1 tablet (25 mg total) by mouth daily. Minna Merritts, MD Taking Active Self  metFORMIN (GLUCOPHAGE) 1000 MG tablet [id number] Yes TAKE 1 TABLET TWICE A DAY WITH MEALS [name], MD Taking Active Self  Multiple Vitamins-Minerals (MULTIVITAMINS THER. W/MINERALS) Sheral Flow [id number] Yes Take 1 tablet by mouth every morning. [provider] Taking Active Self  niacin (NIASPAN) 1000 MG CR tablet [id number] Yes Take 2 tablets (2000 mg) by mouth once daily at bedtime Minna Merritts, MD Taking Active  Self  NON FORMULARY [id number] No Take 1-2 tablets by mouth daily as needed (Pain). CBD Gummy  Patient not taking: Reported on 07/11/2022   [provider] Not Taking Active Self  potassium chloride SA (KLOR-CON M) 20 MEQ tablet [id number] Yes Take 1 tablet (20 mEq total) by mouth daily. Minna Merritts, MD Taking Active Self  tirzepatide [facility]) 5 MG/0.5ML Pen [id number] No Inject 5 mg into the skin once a week. Start after completing the 2.5 mg dose  Patient not taking: Reported on 06/30/2022   [name], MD Not Taking Active Self           Med Note Vianne Bulls Jun 25, 2022  3:07 PM) Has not started this dose yet, still waiting to receive it from [facility]              Assessment/Plan:   Diabetes: - Currently uncontrolled - Reviewed long term cardiovascular and renal outcomes of uncontrolled blood sugar - Reviewed goal A1c, goal fasting, and goal  2 hour post prandial glucose - Recommend to continue use of CGM for enhanced evaluation of glycemic control. Patient is sharing device readings with the healthcare team and will continue to engage in a comprehensive diabetes education program.  - Recommend to check glucose continuously using CGM. Will complete Libre 3 PA.  - Unclear if Mounjaro copay was the same as Trulicity; Ozempic may be a more preferred GLP1. Will complete PA for Ozempic. Continue metformin 1000 mg twice daily, Lantus 30 units daily, Fiasp 5 units with meals.   Hypertension: - Currently controlled - Recommend to continue current regimen at this time   Hyperlipidemia/ASCVD Risk Reduction: - Currently controlled but with medication side effects.  - Recommend to continue atorvastatin and ezetimibe. Will discuss with PCP and Cardiology about flushing side effect with niacin and discuss discontinuation. More preferred options for TG treatment would be Vascepa, if resultant TG elevation   Follow Up Plan: follow up in ~ 4-6  weeks  Catie Hedwig Morton, PharmD, [place], [place] [phone]

## 2022-07-15 ENCOUNTER — Telehealth: Payer: Self-pay | Admitting: Family Medicine

## 2022-07-15 NOTE — Telephone Encounter (Signed)
Patient called and stated he has a LIbre 3 in his arm and it has expired. He wants to know what is the next step to get the Elenor Legato out and get another one.

## 2022-07-16 ENCOUNTER — Other Ambulatory Visit: Payer: Self-pay | Admitting: Pharmacist

## 2022-07-16 MED ORDER — OZEMPIC (0.25 OR 0.5 MG/DOSE) 2 MG/3ML ~~LOC~~ SOPN: PEN_INJECTOR | SUBCUTANEOUS | 2 refills | Status: DC

## 2022-07-16 NOTE — Telephone Encounter (Signed)
I called the patient and lvm informing him that he has refills on his libre sensor 3 and it is with [facility] and he can call them to see if it has been shipped.  Nia,cma

## 2022-07-16 NOTE — Progress Notes (Signed)
Care Coordination Call  Received notification of PA denial for Libre 3 CGM. Contacted patient, left voicemail messaged.   Received notification of PA approval for Ozempic as discussed with patient at last visit. Order placed. Start Ozempic 0.25 mg weekly for 4 weeks, then increase to 0.5 mg weekly.   Catie Hedwig Morton, PharmD, [place], [place] [phone]

## 2022-07-17 ENCOUNTER — Encounter: Payer: Self-pay | Admitting: Radiology

## 2022-07-18 ENCOUNTER — Encounter: Payer: Medicare Other | Admitting: Surgical

## 2022-07-21 ENCOUNTER — Encounter: Payer: Self-pay | Admitting: Orthopedic Surgery

## 2022-07-23 ENCOUNTER — Other Ambulatory Visit: Payer: Self-pay | Admitting: Surgical

## 2022-07-23 MED ORDER — HYDROCODONE-ACETAMINOPHEN 5-325 MG PO TABS: 1.0000 | ORAL_TABLET | Freq: Every day | ORAL | 0 refills | Status: DC | PRN

## 2022-07-24 ENCOUNTER — Other Ambulatory Visit: Payer: Self-pay

## 2022-07-24 DIAGNOSIS — M19011 Primary osteoarthritis, right shoulder: Secondary | ICD-10-CM

## 2022-07-24 NOTE — Telephone Encounter (Signed)
Can you arrange referral to pain management? As far as the Ozempic goes, he needs to discuss that with his PCP

## 2022-07-28 ENCOUNTER — Encounter: Payer: Self-pay | Admitting: Family Medicine

## 2022-07-30 NOTE — Telephone Encounter (Signed)
It is fine to change this to a 90 day supply.

## 2022-07-30 NOTE — Telephone Encounter (Signed)
Are you okay if this is changed to a 90 day supply?

## 2022-07-30 NOTE — Telephone Encounter (Signed)
Prescription sent in  

## 2022-08-05 ENCOUNTER — Encounter: Payer: Self-pay | Admitting: Physical Medicine & Rehabilitation

## 2022-08-08 ENCOUNTER — Encounter: Payer: Self-pay | Admitting: *Deleted

## 2022-08-15 ENCOUNTER — Encounter: Payer: Medicare Other | Admitting: Surgical

## 2022-08-22 ENCOUNTER — Encounter: Payer: Medicare Other | Attending: Physical Medicine & Rehabilitation | Admitting: Physical Medicine & Rehabilitation

## 2022-08-22 ENCOUNTER — Encounter: Payer: Self-pay | Admitting: Physical Medicine & Rehabilitation

## 2022-08-22 VITALS — BP 121/68 | HR 85 | Ht 68.0 in | Wt 187.0 lb

## 2022-08-22 DIAGNOSIS — G8929 Other chronic pain: Secondary | ICD-10-CM | POA: Diagnosis not present

## 2022-08-22 DIAGNOSIS — M25511 Pain in right shoulder: Secondary | ICD-10-CM | POA: Insufficient documentation

## 2022-08-22 NOTE — Progress Notes (Signed)
Subjective:    Patient ID: [name], male    DOB: [birth year], 72 y.o.   MRN: [id number]  HPI  CC:  Right shoulder pain   Several year hx of Right shoulder pain starting around time of military discharge in 2001.  Pt has tried PT and shoulder injections but now is on CBD gummies which help him with pain .  He states that he has taken hydrocodone in the past but the Gummies actually helped him a little bit more.  He has tried physical therapy for this problem which did not help him much.  He also has tried nonsteroidal anti-inflammatory medications.  He has tried corticosteroid injections of the shoulder as well without much relief.  He is planning to have a reverse shoulder Arthroplasty performed by Dr. August Saucer once his diabetes is under better control.  Prostate bead implants last year with urology f/u scheduled in several months.  Pt has some neck pain since 1992 went to PT for this in the past.  CLINICAL DATA:  Shoulder pain 4 3 months.   EXAM: MRI OF THE RIGHT SHOULDER WITHOUT CONTRAST   TECHNIQUE: Multiplanar, multisequence MR imaging of the shoulder was performed. No intravenous contrast was administered.   COMPARISON:  Radiographs 12/16/2021   FINDINGS: Rotator cuff: Moderate supraspinatus tendinopathy/tendinosis with interstitial tear involving the anterior attachment fibers. No partial or full-thickness rotator cuff tear is identified.   Muscles:  No significant findings.   Biceps long head: Focal tendinopathy and suspected short segment longitudinal tear involving the intra-articular portion of the long head biceps tendon.   Acromioclavicular Joint: Moderate degenerative changes. Type 1 acromion. Mild lateral downsloping and subacromial spurring.   Glenohumeral Joint: Moderate to advanced degenerative changes with areas of near full-thickness cartilage loss, joint space narrowing and osteophytic spurring. On the axial images there appears to be moderate  posterior glenoid deficiency with associated hypertrophic posterior labrum which is partially torn and undermined by fluid.   Labrum: Hypertrophied posterior labrum as detailed above with partial-thickness undersurface tearing. Degenerative changes involving the anterior labrum but no obvious tear. The superior labrum appears intact.   Bones:  No acute bony findings.   Other: Moderate fluid in the rotator interval could suggest a rotator interval tear and synovitis. There is also mild subcoracoid bursitis.   IMPRESSION: 1. Moderate supraspinatus tendinopathy/tendinosis with interstitial tear involving the anterior attachment fibers. No partial or full-thickness rotator cuff tear. 2. Focal tendinopathy and suspected short segment longitudinal tear involving the intra-articular portion of the long head biceps tendon. 3. Moderate to advanced glenohumeral joint degenerative changes with moderate posterior glenoid deficiency and associated hypertrophic posterior labrum which is partially torn and undermined by fluid. 4. Moderate fluid in the rotator interval could suggest a rotator interval tear and synovitis. X     Electronically Signed   By: Rudie Meyer M.D.   On: 04/28/2022 11:01 Pain Inventory Average Pain 4 Pain Right Now 3 My pain is constant, burning, stabbing, and aching  In the last 24 hours, has pain interfered with the following? General activity 4 Relation with others 3 Enjoyment of life 6 What TIME of day is your pain at its worst? evening Sleep (in general) Poor  Pain is worse with: some activites Pain improves with: medication Relief from Meds: 5  walk without assistance ability to climb steps?  yes do you drive?  yes  disabled: date disabled 2007  weakness depression suicidal thoughts  Any changes since last visit?  yes CT/MRI  Any changes since last visit?  yes    Family History  Problem Relation Age of Onset   COPD Mother    Other  Father        muscle tumor   Stroke Father    Prostate cancer Father        mets   Heart disease Father    Alcoholism Father    Stroke Sister    Heart block Sister        Heart bypass   Bladder Cancer Neg Hx    Kidney cancer Neg Hx    Social History   Socioeconomic History   Marital status: Married    Spouse name: Meriam Sprague   Number of children: 2   Years of education: Not on file   Highest education level: Not on file  Occupational History   Occupation: [employer]-retired   Occupation: Airline pilot  Tobacco Use   Smoking status: Former    Packs/day: 1.50    Years: 40.00    Additional pack years: 0.00    Total pack years: 60.00    Types: Cigarettes    Quit date: 09/14/2007    Years since quitting: 14.9   Smokeless tobacco: Never   Tobacco comments:    quit 2009  Vaping Use   Vaping Use: Never used  Substance and Sexual Activity   Alcohol use: No    Alcohol/week: 0.0 standard drinks of alcohol    Comment: rarely 1 beer in last 5 years   Drug use: Not Currently    Types: Marijuana    Comment: pt stopped fall 2012   Sexual activity: Never  Other Topics Concern   Not on file  Social History Narrative   Retired twice    Working with rescue dogs    Lives with girlfriend   Pet: 1 snake ([place] Bull snake), 3 dogs    Caffeine- coffee 3 cups a day, tea- unsweet    Social Determinants of Health   Financial Resource Strain: Low Risk  (06/07/2021)   Overall Financial Resource Strain (CARDIA)    Difficulty of Paying Living Expenses: Not hard at all  Food Insecurity: No Food Insecurity (06/07/2021)   Hunger Vital Sign    Worried About Running Out of Food in the Last Year: Never true    Ran Out of Food in the Last Year: Never true  Transportation Needs: No Transportation Needs (06/07/2021)   PRAPARE - Administrator, Civil Service (Medical): No    Lack of Transportation (Non-Medical): No  Physical Activity: Sufficiently Active (06/07/2021)   Exercise Vital Sign    Days of  Exercise per Week: 4 days    Minutes of Exercise per Session: 60 min  Stress: No Stress Concern Present (06/07/2021)   [employer] of Occupational Health - Occupational Stress Questionnaire    Feeling of Stress : Not at all  Social Connections: Unknown (06/07/2021)   Social Connection and Isolation Panel [NHANES]    Frequency of Communication with Friends and Family: Not on file    Frequency of Social Gatherings with Friends and Family: Not on file    Attends Religious Services: Not on file    Active Member of Clubs or Organizations: Not on file    Attends Banker Meetings: Not on file    Marital Status: Married   Past Surgical History:  Procedure Laterality Date   CORONARY ANGIOPLASTY     x 5   CORONARY ARTERY BYPASS GRAFT N/A 11/20/2020   Procedure:  CORONARY ARTERY BYPASS GRAFTING (CABG) x  FIVE (LIMA-dLAD, SVG-PDA, SVG-D1, SeqSVG-OM1-2) ON PUMP  USING LEFT INTERNAL MAMMARY ARTERTY AND LEFT ENDOSCOPIC GREATER SAPHENOUS VEIN CONDUITS, RIGHT LEG OPENED NOT HARVESTED;  Surgeon: Linden Dolin, MD;  Location: [facility] OR;  Service: Open Heart Surgery;  Laterality: N/A;   CORONARY PRESSURE/FFR STUDY N/A 11/15/2020   Procedure: INTRAVASCULAR PRESSURE WIRE/FFR STUDY;  Surgeon: Yvonne Kendall, MD;  Location: [facility] INVASIVE CV LAB;  Service: Cardiovascular;  Laterality: N/A;   CORONARY STENT PLACEMENT     x 7   LEFT HEART CATH AND CORONARY ANGIOGRAPHY N/A 11/15/2020   Procedure: LEFT HEART CATH AND CORONARY ANGIOGRAPHY;  Surgeon: Yvonne Kendall, MD;  Location: [facility] INVASIVE CV LAB;  Service: Cardiovascular;  Laterality: N/A;   LEFT HEART CATHETERIZATION WITH CORONARY ANGIOGRAM N/A 04/14/2011   Procedure: LEFT HEART CATHETERIZATION WITH CORONARY ANGIOGRAM;  Surgeon: Chrystie Nose, MD;  Location: [facility] CATH LAB;  Service: Cardiovascular;  Laterality: N/A;   LEFT HEART CATHETERIZATION WITH CORONARY ANGIOGRAM N/A 06/25/2012   Procedure: LEFT HEART CATHETERIZATION WITH CORONARY  ANGIOGRAM;  Surgeon: Peter M Swaziland, MD;  Location: [facility] CATH LAB;  Service: Cardiovascular;  Laterality: N/A;   TEE WITHOUT CARDIOVERSION N/A 11/20/2020   Procedure: TRANSESOPHAGEAL ECHOCARDIOGRAM (TEE);  Surgeon: Linden Dolin, MD;  Location: [facility] OR;  Service: Open Heart Surgery;  Laterality: N/A;   TONSILLECTOMY AND ADENOIDECTOMY  1955   Past Medical History:  Diagnosis Date   Actinic keratoses    Angina    Asthma    BCC (basal cell carcinoma of skin) 09/06/2020   left upper back lateral (EDC) ,  left upper arm anterior (EDC), left upper back medial (EDC)    Bursitis of left elbow 2018   Diabetes mellitus without complication    ED (erectile dysfunction)    HTN (hypertension) 04/12/2011   Hyperlipemia 04/12/2011   Ischemic cardiomyopathy    a. echo 2010: EF 45-50%, mild to mod ant and apical wall HK, trace MR; b. cardiac cath 06/2012: EF 40%, mild MR    Multiple vessel coronary artery disease 04/12/2011   a. remote MI 1996; b. MI 1997 s/p PCI - LAD & diag; c. MI 2009: long stenting P-MLAD & Diag & POBA of jailed diag branch; d. cath 2010: 80% ISR of LAD w/ L-R collats, med Rx; e. cath 2012: 50-60% tub mLAD, 60-70% dLAD, FFR 0.75 -->s/p PCI dissection => w/ 2 stents (3 total); f. cath 06/2012: no changes from 2012 cath, med Rx, patent stents -> MV CAD 11/20/20 --> CABG X 5   Myocardial infarction    x 5   Obesity    OSA (obstructive sleep apnea)    on CPAP   Osteoarthritis    Renal disorder    kidney stone   BP 121/68   Pulse 85   Ht 5\' 8"  (1.727 m)   Wt 187 lb (84.8 kg)   SpO2 95%   BMI 28.43 kg/m   Opioid Risk Score:   Fall Risk Score:  `1  Depression screen [facility] 2/9     08/22/2022   12:51 PM 06/30/2022    4:09 PM 04/02/2022    2:26 PM 10/02/2021   11:39 AM 06/07/2021    8:36 AM 03/12/2021    9:39 AM 12/31/2020   12:01 PM  Depression screen PHQ 2/9  Decreased Interest 1 1 1  0 0 0 0  Down, Depressed, Hopeless 1 1 1  0 0 0 0  PHQ - 2 Score 2 2 2  0  0 0 0  Altered  sleeping 2 0 2   0 0  Tired, decreased energy 0 2  Change in appetite 0 0 1   0 1  Feeling bad or failure about yourself  1 0 0   0 0  Trouble concentrating 0 0 0   0 0  Moving slowly or fidgety/restless 0 0 0   0 0  Suicidal thoughts 0 0 0   0 0  PHQ-9 Score 0 3  Difficult doing work/chores  Somewhat difficult Not difficult at all   Not difficult at all Not difficult at all    Review of Systems  Musculoskeletal:        Right shoulder & right arm pain  All other systems reviewed and are negative.      Objective:   Physical Exam  No evidence of deltoid atrophy bilaterally Cervical spine range of motion is mildly reduced with flexion extension lateral bending and rotation but no pain in the shoulder with cervical spine range of motion.  There is mild tenderness over the right trap compared to the left trap but no other tenderness in the periscapular area No pain to palpation over the subacromial area.  Positive drop arm test on the right side.  There is pain with abduction of the right shoulder of around 100 degrees. Motor strength is 4/5 in the right biceps triceps 5/5 in the grip 3 - at the deltoid limited by pain.  5/5 in left upper extremity and bilateral lower extremities. Ambulates without assistive device no evidence of drag or knee instability. Sensation normal bilateral upper extremities.       Assessment & Plan:   1.  Severe right glenohumeral osteoarthritis as well as rotator cuff tendinopathy causing chronic right shoulder pain and limited range of motion.  His longer-term plan is reverse arthroplasty right shoulder but is working on diabetic control preoperatively prior to surgical parents. Patient gets some partial relief with CBD oil, not a good candidate for nonsteroidal anti-inflammatories based on age and history of coronary artery disease . Would avoid corticosteroids as long as blood sugars are elevated. Would be a good candidate for suprascapular  nerve block under ultrasound guidance

## 2022-08-28 ENCOUNTER — Other Ambulatory Visit: Payer: Medicare Other

## 2022-09-04 ENCOUNTER — Telehealth: Payer: Self-pay | Admitting: Family Medicine

## 2022-09-04 ENCOUNTER — Ambulatory Visit: Payer: Medicare Other | Admitting: Radiation Oncology

## 2022-09-04 NOTE — Telephone Encounter (Signed)
Copied from CRM [id number]. Topic: Medicare AWV >> Sep 04, 2022 10:04 AM Rushie Goltz wrote: Reason for CRM: Called patient to schedule Medicare Annual Wellness Visit (AWV). Left message for patient to call back and schedule Medicare Annual Wellness Visit (AWV).  Last date of AWV: 06/07/2021  Please schedule an AWVS appointment at any time with [facility] VISIT.  If any questions, please contact me at [phone].    Thank you,  [facility] Support [facility] Direct dial  [phone]

## 2022-09-17 ENCOUNTER — Ambulatory Visit (INDEPENDENT_AMBULATORY_CARE_PROVIDER_SITE_OTHER): Payer: Medicare Other | Admitting: Orthopaedic Surgery

## 2022-09-17 DIAGNOSIS — E1165 Type 2 diabetes mellitus with hyperglycemia: Secondary | ICD-10-CM | POA: Diagnosis not present

## 2022-09-17 DIAGNOSIS — M7062 Trochanteric bursitis, left hip: Secondary | ICD-10-CM

## 2022-09-17 MED ORDER — METHYLPREDNISOLONE ACETATE 40 MG/ML IJ SUSP
40.0000 mg | INTRAMUSCULAR | Status: AC | PRN
  Administered 2022-09-17: 40 mg via INTRA_ARTICULAR

## 2022-09-17 MED ORDER — LIDOCAINE HCL 1 % IJ SOLN
3.0000 mL | INTRAMUSCULAR | Status: AC | PRN
  Administered 2022-09-17: 3 mL

## 2022-09-17 NOTE — Progress Notes (Signed)
The patient is a 72 year old gentleman well-known to [place].  He comes in today requesting a steroid injection in his left hip to treat trochanteric bursitis.  He has been over 8 months since he had that type of injection.  He is a diabetic and up until recently had been under poor control.  He was actually seeing my partner Dr. August Saucer and being considered for shoulder replacement surgery but then it was found out that his hemoglobin A1c was 12 according the patient.  He is showing me a long trend of his blood glucose that has been recording daily for the last several weeks and all of his numbers look great.  He did have a reading of over 400 on March 24.  Today's reading showed only 95 this morning.  He is watching this close.  He understands that his steroid injection over his trochanteric area today can increase his blood sugars but will have an effect on hemoglobin A1c.  He has not had that number checked in a while so I let him know that we can see about drawing that lab today so we can get a better understanding of his long-term blood glucose control.  His left hip does hurt over the trochanteric area but not in the groin.  It is consistent with his previous diagnosis of trochanteric bursitis.  I did place a steroid injection over his left hip trochanteric area today which she tolerated well.  He knows to watch his blood glucose closely because of the detrimental effects of steroids can have on blood glucose.  We will let him know about the A1c results.     Procedure Note  Patient: [name]             Date of Birth: 12-29-1950           MRN: [id number]             Visit Date: 09/17/2022  Procedures: Visit Diagnoses:  1. Trochanteric bursitis, left hip     Large Joint Inj: L greater trochanter on 09/17/2022 2:46 PM Indications: pain and diagnostic evaluation Details: 22 G 1.5 in needle, lateral approach  Arthrogram: No  Medications: 3 mL lidocaine 1 %; 40 mg methylPREDNISolone  acetate 40 MG/ML Outcome: tolerated well, no immediate complications Procedure, treatment alternatives, risks and benefits explained, specific risks discussed. Consent was given by the patient. Immediately prior to procedure a time out was called to verify the correct patient, procedure, equipment, support staff and site/side marked as required. Patient was prepped and draped in the usual sterile fashion.

## 2022-09-17 NOTE — Addendum Note (Signed)
Addended by: Hans Eden on: 09/17/2022 04:15 PM   Modules accepted: Orders

## 2022-09-18 LAB — HEMOGLOBIN A1C
Hgb A1c MFr Bld: 9.4 % of total Hgb — ABNORMAL HIGH (ref <5.7)
Mean Plasma Glucose: 223 mg/dL
eAG (mmol/L): 12.4 mmol/L

## 2022-09-18 IMAGING — CT CT RENAL STONE PROTOCOL
2 of 4 series · 15 of 46 positions shown, 17 images · non-contrast
Comparison: 08/25/2017

CLINICAL DATA: Left flank pain



[Series 2: ap without · axial · non-contrast · 0.79mm/px · z∈[-980,-525]mm · 12 of 103 slices shown, 14 images]
[im 7/103  soft-tissue]
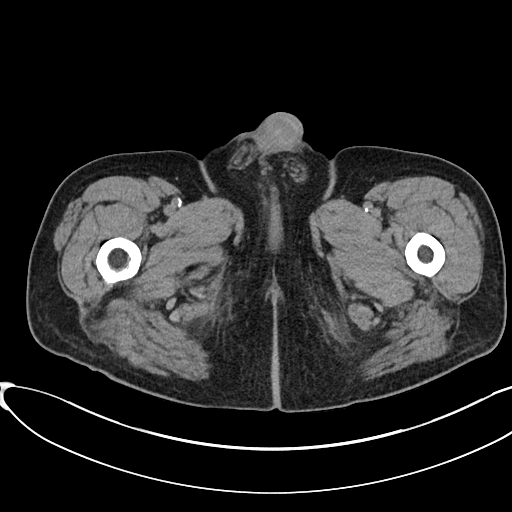
[im 7/103  bone]
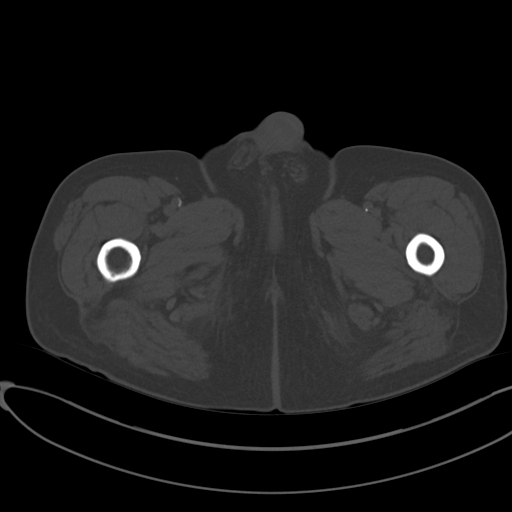
[im 19/103  soft-tissue]
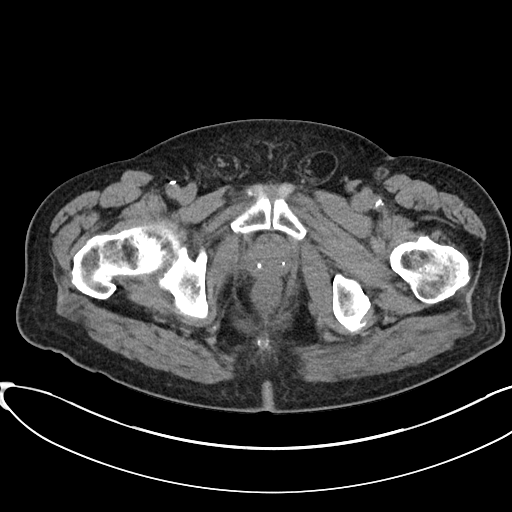
[im 25/103  soft-tissue]
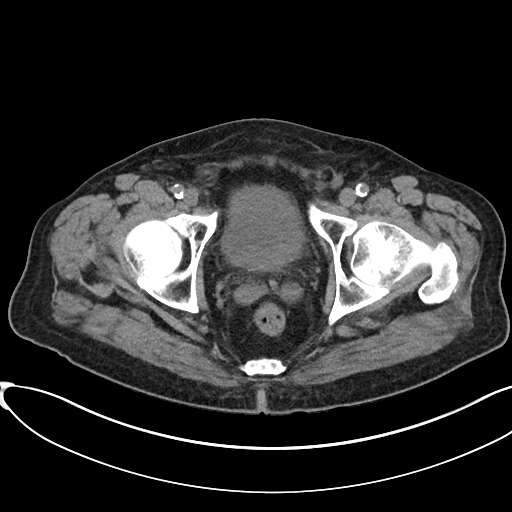
[im 31/103  soft-tissue]
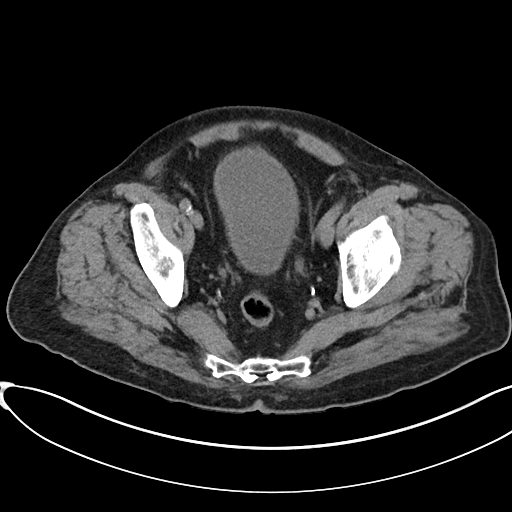
[im 43/103  soft-tissue]
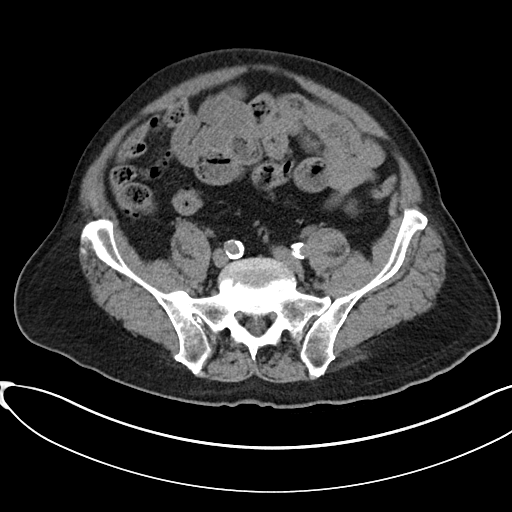
[im 49/103  soft-tissue]
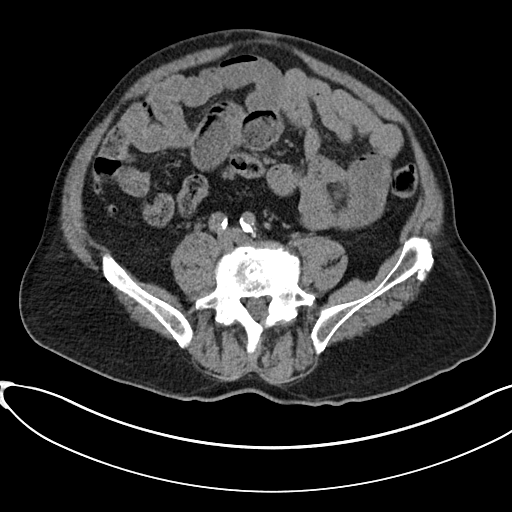
[im 55/103  soft-tissue]
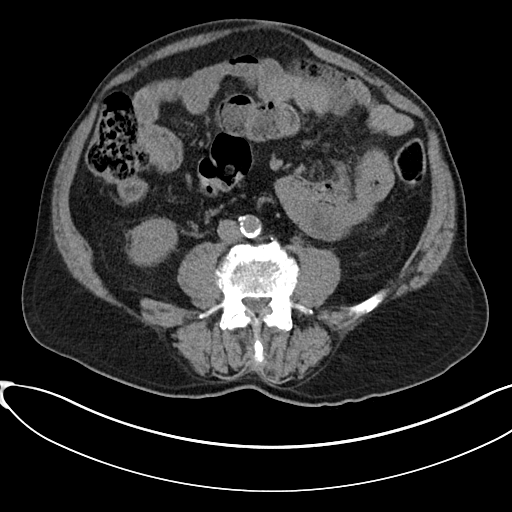
[im 67/103  soft-tissue]
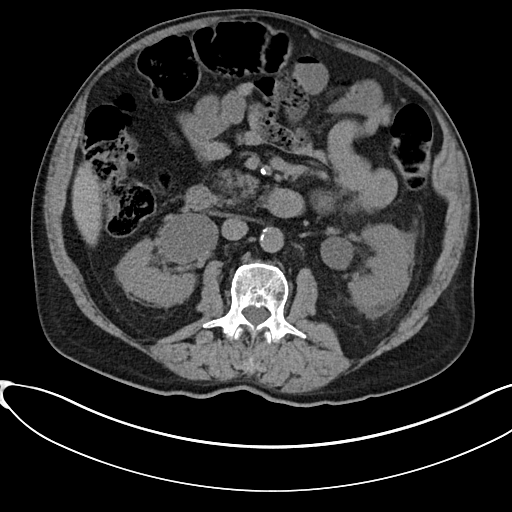
[im 73/103  soft-tissue]
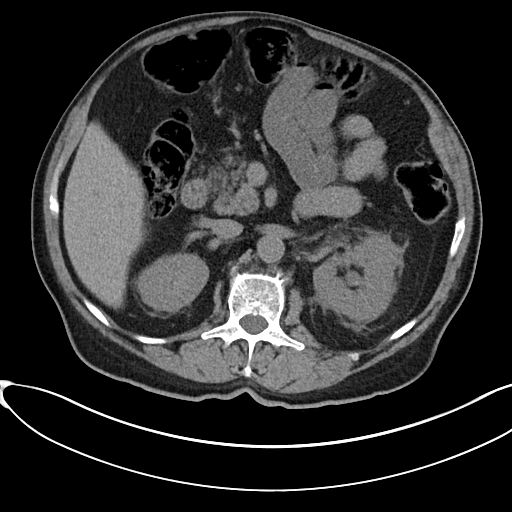
[im 73/103  bone]
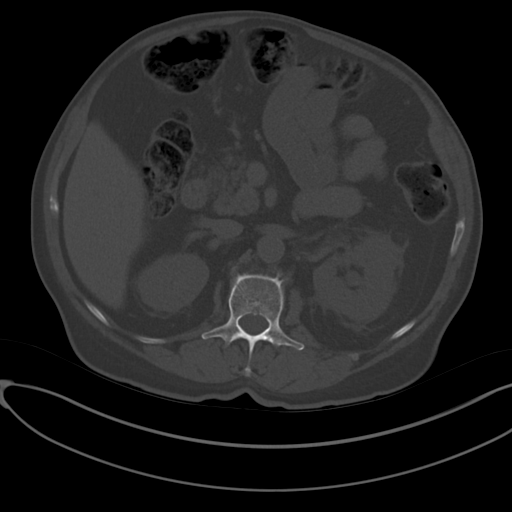
[im 79/103  soft-tissue]
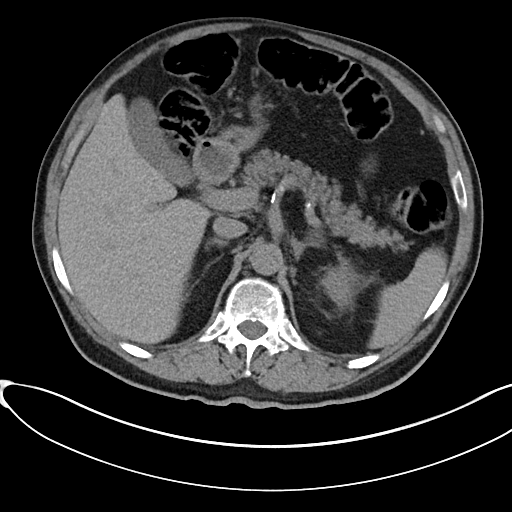
[im 91/103  soft-tissue]
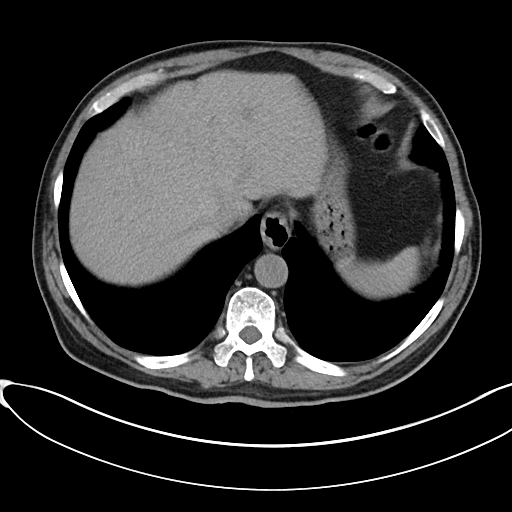
[im 97/103  soft-tissue]
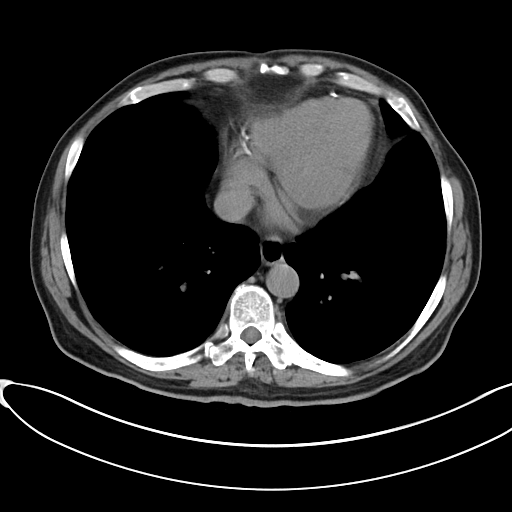

[Series 5: cor · coronal · 0.75mm/px · 3 of 109 slices shown]
[im 37/109  soft-tissue]
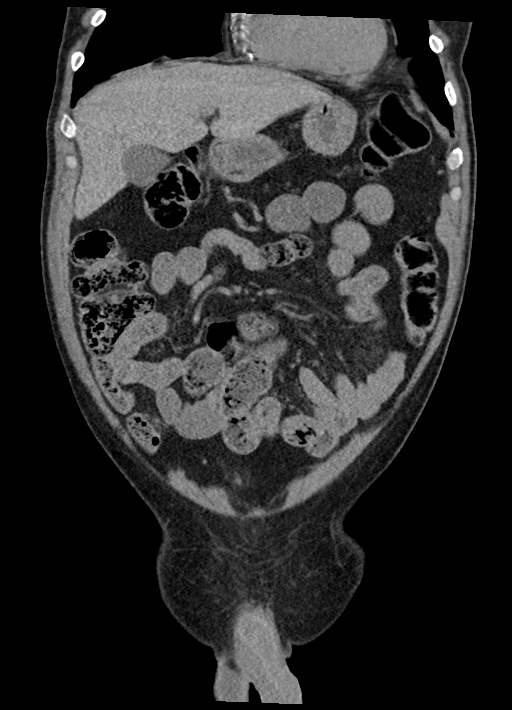
[im 49/109  soft-tissue]
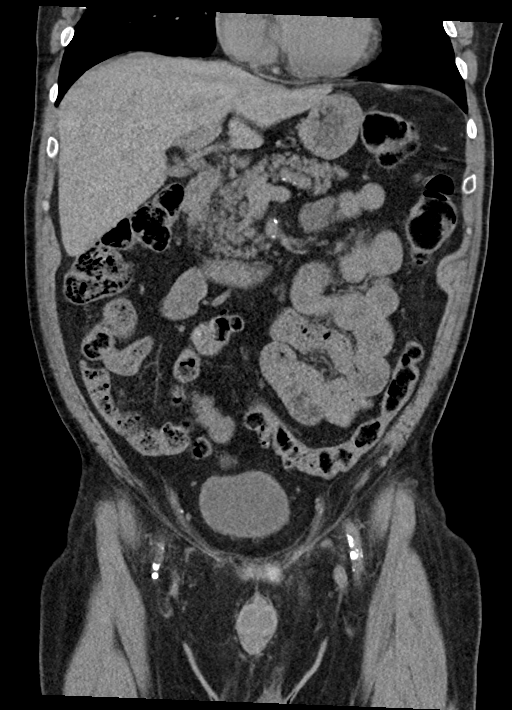
[im 61/109  soft-tissue]
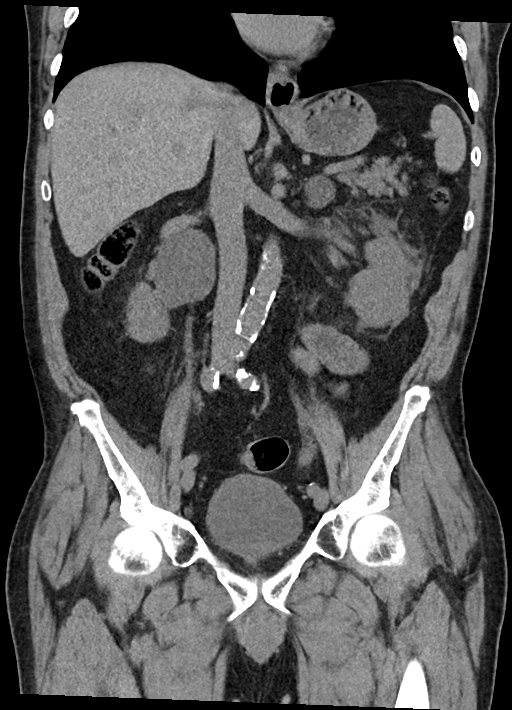

[15 of 46 positions shown; findings below may reference images not displayed]

FINDINGS: Lower chest: Coronary artery calcifications.  No acute abnormality.

Hepatobiliary: No focal hepatic abnormality. Gallbladder
unremarkable.

Pancreas: No focal abnormality or ductal dilatation.

Spleen: No focal abnormality.  Normal size.

Adrenals/Urinary Tract: 2.3 cm low-density left adrenal nodule again
noted, unchanged since prior study most compatible with adenoma.
Right adrenal gland normal. Mild left hydronephrosis and extensive
perinephric stranding around the left kidney. Left ureter mildly
dilated to the bladder. No visible obstructing stones. Right
hydronephrosis noted. Right ureter decompressed. Configuration most
compatible with chronic UPJ obstruction. Punctate bilateral
nonobstructing renal stones. Urinary bladder unremarkable.

Stomach/Bowel: Sigmoid diverticulosis. No active diverticulitis.
Stomach and small bowel decompressed, unremarkable. Normal appendix.

Vascular/Lymphatic: Aortic atherosclerosis. No evidence of aneurysm
or adenopathy.

Reproductive: No visible focal abnormality.

Other: No free fluid or free air. Small left inguinal hernia
containing fat.

Musculoskeletal: No acute bony abnormality.
IMPRESSION: Moderate left hydroureteronephrosis with extensive perinephric
stranding. No visible obstructing stones.

Right hydronephrosis with a chronic UPJ configuration.

Bilateral nephrolithiasis.

Sigmoid diverticulosis.

Aortic atherosclerosis.

## 2022-09-19 ENCOUNTER — Encounter: Payer: Medicare Other | Attending: Physical Medicine & Rehabilitation | Admitting: Physical Medicine & Rehabilitation

## 2022-09-19 ENCOUNTER — Telehealth: Payer: Self-pay | Admitting: Family Medicine

## 2022-09-19 ENCOUNTER — Encounter: Payer: Self-pay | Admitting: Physical Medicine & Rehabilitation

## 2022-09-19 VITALS — BP 137/75 | HR 77 | Temp 98.0°F | Ht 68.0 in | Wt 189.0 lb

## 2022-09-19 DIAGNOSIS — G8929 Other chronic pain: Secondary | ICD-10-CM | POA: Insufficient documentation

## 2022-09-19 DIAGNOSIS — M25511 Pain in right shoulder: Secondary | ICD-10-CM | POA: Insufficient documentation

## 2022-09-19 DIAGNOSIS — E1165 Type 2 diabetes mellitus with hyperglycemia: Secondary | ICD-10-CM

## 2022-09-19 MED ORDER — INSULIN GLARGINE 100 UNIT/ML SOLOSTAR PEN: 35.0000 [IU] | PEN_INJECTOR | Freq: Every day | SUBCUTANEOUS | 2 refills | Status: DC

## 2022-09-19 MED ORDER — BETAMETHASONE SOD PHOS & ACET 6 (3-3) MG/ML IJ SUSP
3.0000 mg | Freq: Once | INTRAMUSCULAR | Status: AC
  Administered 2022-09-19: 3 mg

## 2022-09-19 MED ORDER — LIDOCAINE HCL 1 % IJ SOLN
5.0000 mL | Freq: Once | INTRAMUSCULAR | Status: AC
  Administered 2022-09-19: 5 mL

## 2022-09-19 MED ORDER — OZEMPIC (0.25 OR 0.5 MG/DOSE) 2 MG/3ML ~~LOC~~ SOPN: PEN_INJECTOR | SUBCUTANEOUS | 2 refills | Status: DC

## 2022-09-19 NOTE — Telephone Encounter (Signed)
Per last OV in February with Dr. Darrick Huntsman (see below) pt was suppose to return in one week. I can refill the Lantus & Ozempic & he needs to keep his appt on Wed 5/15 with Catie & then schedule a f/u with PCP.  I spoke with Meriam Sprague on DPR to discuss this info because Mr. Claussen was sleep.  Increase lantus to 35 units starting tomorrow   Start using the Fiasp (aspart) short acting insulin 5 units   before  breakfast , lunch and dinner      Start walking daily for exercise   this will help increase your body's sensitivity to the insulin it is making    I am contacting our Pharm D to help get your medications for less $$$$   RETURN IN  ONE WEEK TO REVIEW BLOOD SUGARS AND ADJUST DOSING    Instructions   Return in about 1 week (around 07/07/2022) for follow up diabetes. Increase lantus to 35 units starting tomorrow   Start using the Fiasp (aspart) short acting insulin 5 units   before  breakfast , lunch and dinner      Start walking daily for exercise   this will help increase your body's sensitivity to the insulin it is making    I am contacting our Pharm D to help get your medications for less $$$$   RETURN IN  ONE WEEK TO REVIEW BLOOD SUGARS AND ADJUST DOSING

## 2022-09-19 NOTE — Telephone Encounter (Signed)
Patient states he is returning our call.  I read message from Thurmond Butts, CMA, to patient.  I verified that we have a note in our system stating [facility] in [place] confirmed receipt of these prescriptions today.  Patient states, "Please let Latoya know the old man said it was perfect.".

## 2022-09-19 NOTE — Patient Instructions (Addendum)
Injected cortisone medication  may have an extra 100pt elevation of Glucose for the next 2-3 d  Last HgA1C 9.4 will likely need recheck in ~78mo

## 2022-09-19 NOTE — Progress Notes (Signed)
Right suprascapular nerve block under US guidance  Indication chronic shoulder pain that is not responsive to medication management and other conservative care  Pain is severe and interferes with activities and sleep and is unresponsive to subacromial injections, PT.   Informed consent was obtained after describing risks and benefits of the procedure including bleeding bruising and infection. She elects to proceed and has given written consent. Patient placed in a seated position medial to lateral approach utilized. Linear transducer placed in oblique coronal plane. Suprascapular notch identified. Needle inserted in plane medial to lateral. Once target was reached with needle tip, a solution containing one ML 6 ML per cc betamethasone and 4 mL 1% lidocaine were injected. Patient tolerated procedure well. Post procedure instructions given

## 2022-09-19 NOTE — Telephone Encounter (Signed)
PaPrescription Request  09/19/2022  LOV: 04/02/2022  What is the name of the medication or equipment? Basaglar pen insulin, (PATIENT WILL BE OUT OF THIS MEDICATION IN 48 HOURS.) Semaglutide,0.25 or 0.5MG /DOS, (OZEMPIC, 0.25 OR 0.5 MG/DOSE,) 2 MG/3ML SOPN , AND another pen of insulin, it was black in color, it was a fast acting insulin. Patient has no idea of the name.    Have you contacted your pharmacy to request a refill? Yes   Which pharmacy would you like this sent to?   [id number] [place], [place] - [address] AT [facility] [address] [place] [address] Phone: [phone] Fax: [phone]     Patient notified that their request is being sent to the clinical staff for review and that they should receive a response within 2 business days.   Please advise at [facility] [phone]

## 2022-09-22 NOTE — Telephone Encounter (Signed)
Noted  

## 2022-09-23 ENCOUNTER — Other Ambulatory Visit: Payer: Medicare Other | Admitting: Pharmacist

## 2022-09-23 DIAGNOSIS — E1165 Type 2 diabetes mellitus with hyperglycemia: Secondary | ICD-10-CM

## 2022-09-23 MED ORDER — INSULIN GLARGINE 100 UNIT/ML SOLOSTAR PEN: 30.0000 [IU] | PEN_INJECTOR | Freq: Every day | SUBCUTANEOUS | 2 refills | Status: DC

## 2022-09-23 MED ORDER — SEMAGLUTIDE (1 MG/DOSE) 4 MG/3ML ~~LOC~~ SOPN: 1.0000 mg | PEN_INJECTOR | SUBCUTANEOUS | 1 refills | Status: DC

## 2022-09-23 NOTE — Patient Instructions (Signed)
Rhet,   Increase Ozempic to 1 mg weekly. This medication may cause stomach upset, queasiness, or constipation, especially when increasing the dose. This generally improves over time. Call our office if these symptoms occur and worsen, or if you have severe symptoms such as vomiting, diarrhea, or stomach pain.   Decrease Lantus to 30 units daily to help reduce the risk of low blood sugars. Continue metformin 1000 mg twice daily  Check your blood sugars twice daily:  1) Fasting, first thing in the morning before breakfast and  2) 2 hours after your largest meal.   For a goal A1c of less than 7%, goal fasting readings are less than 130 and goal 2 hour after meal readings are less than 180.    Take care!  Catie Eppie Gibson, PharmD, BCACP, CPP [facility] [phone]

## 2022-09-23 NOTE — Progress Notes (Signed)
09/23/2022 Name: [name] MRN: [id number] DOB: 09-22-1950  Chief Complaint  Patient presents with   Medication Management   Diabetes   Hypertension   Hyperlipidemia    [name] is a 72 y.o. year old male who presented for a telephone visit.   They were referred to the pharmacist by their PCP for assistance in managing diabetes, hypertension, and hyperlipidemia.   Subjective:  Care Team: Primary Care Provider: Glori Luis, MD ; Next Scheduled Visit:   Medication Access/Adherence  Current Pharmacy:  [facility] DELIV[facility] - [address], [place] - [address] [place] [address] Phone: [phone] Fax: [phone]  [id number] [address] [place] - [address] [facility] [address] [place] [address] Phone: [phone] Fax: [phone]  [id number] [place], [place] - [address] [place] [address] Phone: [phone] Fax: [phone]   Patient reports affordability concerns with their medications: No  Patient reports access/transportation concerns to their pharmacy: No  Patient reports adherence concerns with their medications:  No     Diabetes:  Current medications: Ozempic 0.5 mg weekly, Lantus 35 units daily, metformin 1000 mg twice daily; was previously given a sample of Fiasp but was not prescribed; he notes glucose elevations after he completed Fiasp supply  Current glucose readings: 130-200s, variable and higher    Patient denies hypoglycemic s/sx including dizziness, shakiness, sweating. Patient denies hyperglycemic symptoms including polyuria, polydipsia, polyphagia, nocturia, neuropathy, blurred vision.  Current medication access support: notes he did not contact [facility] in time for a refill so [facility]; copays were more expensive than at [facility]. This is why he called in noting that his  medications were expensive.   Hypertension:  Current medications: losartan 25 mg daily, carvedilol 6.25 mg twice daily, furosemide 40 mg daily, additionally on isosorbide 30 mg daily    Hyperlipidemia/ASCVD Risk Reduction  Current lipid lowering medications: atorvastatin 80 mg daily  Objective:  Lab Results  Component Value Date   HGBA1C 9.4 (H) 09/17/2022    Lab Results  Component Value Date   CREATININE 1.19 06/30/2022   BUN 18 06/30/2022   NA 133 (L) 06/30/2022   K 4.7 06/30/2022   CL 96 (L) 06/30/2022   CO2 26 06/30/2022    Lab Results  Component Value Date   CHOL 89 04/02/2022   HDL 35 (L) 04/02/2022   LDLCALC 36 04/02/2022   LDLDIRECT 71.0 07/11/2019   TRIG 96 04/02/2022   CHOLHDL 2.5 04/02/2022    Medications Reviewed Today     Reviewed by Alden Hipp, RPH-CPP (Pharmacist) on 09/23/22 at 1626  Med List Status: <None>   Medication Order Taking? Sig Documenting Provider Last Dose Status Informant  aspirin EC 325 MG EC tablet [id number] Yes Take 1 tablet (325 mg total) by mouth daily. Ardelle Balls, PA-C Taking Active Self  atorvastatin (LIPITOR) 80 MG tablet [id number] Yes Take 1 tablet (80 mg total) by mouth daily. Antonieta Iba, MD Taking Active Self  blood glucose meter kit and supplies KIT [id number] Yes Dispense based on patient and insurance preference. Use up to four times daily as directed. Tyrone Nine, MD Taking Active Self  carvedilol (COREG) 6.25 MG tablet [id number] Yes Take 1 tablet (6.25 mg total) by mouth 2 (two) times daily with a meal. Gollan, Tollie Pizza, MD Taking Active Self  ezetimibe (ZETIA) 10 MG tablet  [id number] Yes Take 1 tablet (10 mg total) by mouth daily. Antonieta Iba, MD Taking Active Self  furosemide (LASIX) 40 MG tablet [id number] Yes Take 1 tablet (40 mg total) by mouth daily. Antonieta Iba, MD Taking Active Self  HYDROcodone-acetaminophen (NORCO/VICODIN) 5-325 MG tablet [id number]  Take 1 tablet by mouth  daily as needed for moderate pain. Magnant, Joycie Peek, PA-C  Active            Med Note Jimmey Ralph, ROBBIN M   Fri Aug 22, 2022 12:51 PM) Last taken last night or night before. NOT RX BY PM&R  insulin aspart (FIASP) 100 UNIT/ML FlexTouch Pen [id number] No 5 units three times daily before meals  Patient not taking: Reported on 09/23/2022   Sherlene Shams, MD Not Taking Active   insulin glargine (LANTUS) 100 UNIT/ML Solostar Pen [id number] Yes Inject 35 Units into the skin daily. Glori Luis, MD Taking Active   Insulin Pen Needle 31G X 5 MM MISC [id number]  Inject insulin via insulin pen daily Tyrone Nine, MD  Active Self  isosorbide mononitrate (IMDUR) 30 MG 24 hr tablet [id number] Yes Take 1 tablet (30 mg total) by mouth daily. Antonieta Iba, MD Taking Active Self  losartan (COZAAR) 25 MG tablet [id number] Yes Take 1 tablet (25 mg total) by mouth daily. Antonieta Iba, MD Taking Active Self  metFORMIN (GLUCOPHAGE) 1000 MG tablet [id number] Yes TAKE 1 TABLET TWICE A DAY WITH MEALS Glori Luis, MD Taking Active Self  Multiple Vitamins-Minerals (MULTIVITAMINS THER. W/MINERALS) Evelena Asa [id number] Yes Take 1 tablet by mouth every morning. [provider] Taking Active Self  niacin (NIASPAN) 1000 MG CR tablet [id number] Yes Take 2 tablets (2000 mg) by mouth once daily at bedtime Antonieta Iba, MD Taking Active Self  NON FORMULARY [id number]  Take 1-2 tablets by mouth daily as needed (Pain). CBD Gummy [provider]  Active Self  potassium chloride SA (KLOR-CON M) 20 MEQ tablet [id number] Yes Take 1 tablet (20 mEq total) by mouth daily. Antonieta Iba, MD Taking Active Self    Discontinued 09/23/22 1626 (Completed Course)   Semaglutide,0.25 or 0.5MG /DOS, (OZEMPIC, 0.25 OR 0.5 MG/DOSE,) 2 MG/3ML SOPN [id number] Yes Inject 0.5 mg weekly Glori Luis, MD Taking Active               Assessment/Plan:   Diabetes: - Currently uncontrolled - Reviewed long term  cardiovascular and renal outcomes of uncontrolled blood sugar - Reviewed goal A1c, goal fasting, and goal 2 hour post prandial glucose - Increase Ozempic to 1 mg weekly. Counseled on increased risk of stomach upset, decreased appetite with this dose increase. Decrease Lantus to 30 units daily to help reduce risk of fasting hypoglycemia. End order for Fiasp. Continue metformin 1000 mg twice daily - Recommend to check glucose twice daily, fasting and 2 hour post prandial  Hypertension: - Currently controlled - Recommend to continue current regimen   Hyperlipidemia/ASCVD Risk Reduction: - Currently controlled.  - Recommend to continue current regimen   Follow Up Plan: phone call in 6 weeks  Catie Eppie Gibson, PharmD, BCACP, CPP [facility] [phone]

## 2022-09-24 ENCOUNTER — Other Ambulatory Visit: Payer: Medicare Other | Admitting: Pharmacist

## 2022-09-25 ENCOUNTER — Other Ambulatory Visit: Payer: Medicare Other

## 2022-09-25 ENCOUNTER — Other Ambulatory Visit: Payer: Self-pay

## 2022-09-25 DIAGNOSIS — R972 Elevated prostate specific antigen [PSA]: Secondary | ICD-10-CM | POA: Diagnosis not present

## 2022-09-26 LAB — PSA: Prostate Specific Ag, Serum: 0.3 ng/mL (ref 0.0–4.0)

## 2022-09-29 ENCOUNTER — Ambulatory Visit
Admission: RE | Admit: 2022-09-29 | Discharge: 2022-09-29 | Disposition: A | Discharge status: Home / Self Care | Payer: Medicare Other | Source: Ambulatory Visit | Attending: Radiation Oncology | Admitting: Radiation Oncology

## 2022-09-29 ENCOUNTER — Encounter: Payer: Self-pay | Admitting: Radiation Oncology

## 2022-09-29 VITALS — BP 120/68 | HR 79 | Temp 97.6°F | Resp 19 | Wt 190.9 lb

## 2022-09-29 DIAGNOSIS — Z923 Personal history of irradiation: Secondary | ICD-10-CM | POA: Insufficient documentation

## 2022-09-29 DIAGNOSIS — Z191 Hormone sensitive malignancy status: Secondary | ICD-10-CM | POA: Diagnosis not present

## 2022-09-29 DIAGNOSIS — C61 Malignant neoplasm of prostate: Secondary | ICD-10-CM | POA: Insufficient documentation

## 2022-09-29 NOTE — Progress Notes (Signed)
Radiation Oncology Follow up Note  Name: [name]   Date:   09/29/2022 MRN:  [id number] DOB: 04/22/1951    This 72 y.o. male presents to the clinic today for 34-month follow-up status post image guided IMRT treatment to his prostate for Gleason 7 (4+3) adenocarcinoma presenting with a PSA of 7.4.  REFERRING PROVIDER: Glori Luis, MD  HPI: Patient is a 72 year old male now out 10 months having completed IMRT radiation therapy to his prostate for Gleason 7 adenocarcinoma.  Seen today in routine follow-up he is doing well.  He specifically denies any increased lower urinary tract symptoms diarrhea or fatigue his most recent PSA is 0.3.  COMPLICATIONS OF TREATMENT: none  FOLLOW UP COMPLIANCE: keeps appointments   PHYSICAL EXAM:  BP 120/68   Pulse 79   Temp 97.6 F (36.4 C)   Resp 19   Wt 190 lb 14.4 oz (86.6 kg)   SpO2 98%   BMI 29.03 kg/m well-developed well-nourished patient in NAD. HEENT reveals PERLA, EOMI, discs not visualized.  Oral cavity is clear. No oral mucosal lesions are identified. Neck is clear without evidence of cervical or supraclavicular adenopathy. Lungs are clear to A&P. Cardiac examination is essentially unremarkable with regular rate and rhythm without murmur rub or thrill. Abdomen is benign with no organomegaly or masses noted. Motor sensory and DTR levels are equal and symmetric in the upper and lower extremities. Cranial nerves II through XII are grossly intact. Proprioception is intact. No peripheral adenopathy or edema is identified. No motor or sensory levels are noted. Crude visual fields are within normal range. RADIOLOGY RESULTS: No current films for review  PLAN: Present time patient remains under excellent biochemical control of his prostate cancer I have asked to see him back in 6 months with a follow-up PSA.  Patient knows to call with any concerns.  I would like to take this opportunity to thank you for allowing me to participate in the  care of your patient.Carmina Miller, MD

## 2022-10-10 ENCOUNTER — Encounter: Payer: Self-pay | Admitting: Family Medicine

## 2022-10-10 ENCOUNTER — Ambulatory Visit (INDEPENDENT_AMBULATORY_CARE_PROVIDER_SITE_OTHER): Payer: Medicare Other | Admitting: Family Medicine

## 2022-10-10 VITALS — BP 118/78 | HR 85 | Temp 98.4°F | Resp 17 | Ht 68.0 in | Wt 188.5 lb

## 2022-10-10 DIAGNOSIS — E1165 Type 2 diabetes mellitus with hyperglycemia: Secondary | ICD-10-CM

## 2022-10-10 DIAGNOSIS — Z794 Long term (current) use of insulin: Secondary | ICD-10-CM

## 2022-10-10 DIAGNOSIS — G4733 Obstructive sleep apnea (adult) (pediatric): Secondary | ICD-10-CM

## 2022-10-10 DIAGNOSIS — I499 Cardiac arrhythmia, unspecified: Secondary | ICD-10-CM | POA: Diagnosis not present

## 2022-10-10 DIAGNOSIS — Z7984 Long term (current) use of oral hypoglycemic drugs: Secondary | ICD-10-CM | POA: Diagnosis not present

## 2022-10-10 DIAGNOSIS — I10 Essential (primary) hypertension: Secondary | ICD-10-CM

## 2022-10-10 NOTE — Assessment & Plan Note (Signed)
Chronic issue.  Patient is benefiting from CPAP with waking well rested and having minimal hypersomnia.  We will request compliance report.  We will place an order for a home sleep study for the patient to get set up for a possible new CPAP given that his is quite old and is coming off intermittently.

## 2022-10-10 NOTE — Assessment & Plan Note (Signed)
Chronic issue.  Poorly controlled though has been improving.  Patient will continue Lantus 30 units daily, metformin 1000 mg twice daily, and back 1 mg weekly.  This dose was recently increased.

## 2022-10-10 NOTE — Assessment & Plan Note (Signed)
Irregular heartbeat noted on exam.  Patient notes that occasionally feels as though his heart skips a beat.  EKG revealed PVCs.  Patient will monitor for symptoms.

## 2022-10-10 NOTE — Assessment & Plan Note (Signed)
Chronic issue.  Adequately controlled.  He will continue Imdur 30 mg daily, losartan 25 mg daily, and carvedilol 6.25 mg twice daily.

## 2022-10-10 NOTE — Progress Notes (Signed)
Marikay Alar, MD Phone: [phone]  [name] is a 72 y.o. male who presents today for f/u.  DIABETES Disease Monitoring: Blood Sugar ranges-94-close to 200, typically 140-160 Polyuria/phagia/dipsia- no      Optho- due Medications: Compliance- taking lantus 30 u daily, ozempic, metformin Hypoglycemic symptoms- no  HYPERTENSION Disease Monitoring Home BP Monitoring not checking Chest pain- no    Dyspnea- no Medications Compliance-  taking losartan, imdur, coreg.  Edema- no BMET    Component Value Date/Time   NA 133 (L) 06/30/2022 0814   NA 139 04/10/2021 0756   K 4.7 06/30/2022 0814   CL 96 (L) 06/30/2022 0814   CO2 26 06/30/2022 0814   GLUCOSE 625 (HH) 06/30/2022 0814   BUN 18 06/30/2022 0814   BUN 17 04/10/2021 0756   CREATININE 1.19 06/30/2022 0814   CREATININE 1.20 04/02/2022 1429   CALCIUM 9.1 06/30/2022 0814   GFRNONAA >60 06/30/2022 0814   GFRAA >60 05/22/2018 1743   OSA CPAP use: using nightly for 8 hours Hypersomnia: notes takes a nap in the afternoon though otherwise no Well rested: yes CPAP company: [employer] Patient notes his CPAP has cut off at times and he has to take it off.  When he goes back to sleep his wife notes that he has very restless sleep without it.    Social History   Tobacco Use  Smoking Status Former   Packs/day: 1.50   Years: 40.00   Additional pack years: 0.00   Total pack years: 60.00   Types: Cigarettes   Quit date: 09/14/2007   Years since quitting: 15.0  Smokeless Tobacco Never  Tobacco Comments   quit 2009    Current Outpatient Medications on File Prior to Visit  Medication Sig Dispense Refill   aspirin EC 325 MG EC tablet Take 1 tablet (325 mg total) by mouth daily. 30 tablet 0   atorvastatin (LIPITOR) 80 MG tablet Take 1 tablet (80 mg total) by mouth daily. 90 tablet 3   blood glucose meter kit and supplies KIT Dispense based on patient and insurance preference. Use up to four times daily as directed. 1  each 0   carvedilol (COREG) 6.25 MG tablet Take 1 tablet (6.25 mg total) by mouth 2 (two) times daily with a meal. 180 tablet 3   ezetimibe (ZETIA) 10 MG tablet Take 1 tablet (10 mg total) by mouth daily. 90 tablet 3   furosemide (LASIX) 40 MG tablet Take 1 tablet (40 mg total) by mouth daily. 90 tablet 3   insulin glargine (LANTUS) 100 UNIT/ML Solostar Pen Inject 30 Units into the skin daily. 30 mL 2   Insulin Pen Needle 31G X 5 MM MISC Inject insulin via insulin pen daily 50 each 0   isosorbide mononitrate (IMDUR) 30 MG 24 hr tablet Take 1 tablet (30 mg total) by mouth daily. 90 tablet 3   losartan (COZAAR) 25 MG tablet Take 1 tablet (25 mg total) by mouth daily. 90 tablet 3   metFORMIN (GLUCOPHAGE) 1000 MG tablet TAKE 1 TABLET TWICE A DAY WITH MEALS 180 tablet 3   Multiple Vitamins-Minerals (MULTIVITAMINS THER. W/MINERALS) TABS Take 1 tablet by mouth every morning.     niacin (NIASPAN) 1000 MG CR tablet Take 2 tablets (2000 mg) by mouth once daily at bedtime 180 tablet 3   NON FORMULARY Take 1-2 tablets by mouth daily as needed (Pain). CBD Gummy     potassium chloride SA (KLOR-CON M) 20 MEQ tablet Take 1 tablet (20 mEq  total) by mouth daily. 90 tablet 3   Semaglutide, 1 MG/DOSE, 4 MG/3ML SOPN Inject 1 mg as directed once a week. 9 mL 1   No current facility-administered medications on file prior to visit.     ROS see history of present illness  Objective  Physical Exam Vitals:   10/10/22 1523  BP: 118/78  Pulse: 85  Resp: 17  Temp: 98.4 F (36.9 C)  SpO2: 97%    BP Readings from Last 3 Encounters:  10/10/22 118/78  09/29/22 120/68  09/19/22 137/75   Wt Readings from Last 3 Encounters:  10/10/22 188 lb 8 oz (85.5 kg)  09/29/22 190 lb 14.4 oz (86.6 kg)  09/19/22 189 lb (85.7 kg)    Physical Exam Constitutional:      General: He is not in acute distress.    Appearance: He is not diaphoretic.  Cardiovascular:     Rate and Rhythm: Normal rate. Rhythm irregular.      Heart sounds: Normal heart sounds.  Pulmonary:     Effort: Pulmonary effort is normal.     Breath sounds: Normal breath sounds.  Skin:    General: Skin is warm and dry.  Neurological:     Mental Status: He is alert.    Diabetic Foot Exam - Simple   Simple Foot Form Diabetic Foot exam was performed with the following findings: Yes 10/10/2022  3:38 PM  Visual Inspection No deformities, no ulcerations, no other skin breakdown bilaterally: Yes Sensation Testing Intact to touch and monofilament testing bilaterally: Yes Pulse Check Posterior Tibialis and Dorsalis pulse intact bilaterally: Yes Comments    EKG: Sinus rhythm with occasional ectopic ventricular beats, rate 88  Assessment/Plan: Please see individual problem list.  Uncontrolled type 2 diabetes mellitus with hyperglycemia, without long-term current use of insulin (HCC) Assessment & Plan: Chronic issue.  Poorly controlled though has been improving.  Patient will continue Lantus 30 units daily, metformin 1000 mg twice daily, and back 1 mg weekly.  This dose was recently increased.   Irregular heart beat Assessment & Plan: Irregular heartbeat noted on exam.  Patient notes that occasionally feels as though his heart skips a beat.  EKG revealed PVCs.  Patient will monitor for symptoms.  Orders: -     EKG 12-Lead  Primary hypertension Assessment & Plan: Chronic issue.  Adequately controlled.  He will continue Imdur 30 mg daily, losartan 25 mg daily, and carvedilol 6.25 mg twice daily.   OSA (obstructive sleep apnea) Assessment & Plan: Chronic issue.  Patient is benefiting from CPAP with waking well rested and having minimal hypersomnia.  We will request compliance report.  We will place an order for a home sleep study for the patient to get set up for a possible new CPAP given that his is quite old and is coming off intermittently.  Orders: -     PSG SLEEP STUDY; Future    Return in about 3 months (around  01/10/2023).   Marikay Alar, MD [facility]

## 2022-10-13 ENCOUNTER — Telehealth: Payer: Self-pay

## 2022-10-13 NOTE — Telephone Encounter (Signed)
Patient states [name] needs a prescription for his CPAP machine.  Please fax prescription to [phone].  [employer] representative who requested this information is Kenney Houseman and her number is [phone], option 3.  Patient states he would like for [place] to call him and let him know when this has been done.  Patient states he will be glad to call Kenney Houseman and let her know.

## 2022-10-13 NOTE — Telephone Encounter (Signed)
CPAP order started and placed in Dr. Purvis Sheffield needs to be signed basket

## 2022-10-15 NOTE — Telephone Encounter (Signed)
I can not find a prior sleep study to use to write the order for the CPAP. With the lack of a sleep study I do not have the ability to write for the settings of the CPAP. I placed an order for a sleep study last week. Please contact the patient and [employer] regarding this. Thanks.

## 2022-10-21 NOTE — Telephone Encounter (Signed)
Requested info via e-fax

## 2022-10-30 DIAGNOSIS — R0602 Shortness of breath: Secondary | ICD-10-CM | POA: Diagnosis not present

## 2022-10-30 DIAGNOSIS — G4733 Obstructive sleep apnea (adult) (pediatric): Secondary | ICD-10-CM | POA: Diagnosis not present

## 2022-10-31 DIAGNOSIS — G4733 Obstructive sleep apnea (adult) (pediatric): Secondary | ICD-10-CM | POA: Diagnosis not present

## 2022-10-31 DIAGNOSIS — R0602 Shortness of breath: Secondary | ICD-10-CM | POA: Diagnosis not present

## 2022-11-03 ENCOUNTER — Telehealth: Payer: Self-pay

## 2022-11-03 NOTE — Telephone Encounter (Signed)
Christoper Allegra Rep, Chalmers Cater, CRT, RCP, dropped off Sleep Therapy Order Form for Dr. Marikay Alar.  I placed the form in Dr. Purvis Sheffield box.

## 2022-11-05 ENCOUNTER — Other Ambulatory Visit: Payer: Medicare Other | Admitting: Pharmacist

## 2022-11-05 ENCOUNTER — Telehealth: Payer: Self-pay

## 2022-11-05 NOTE — Telephone Encounter (Signed)
Signed  Please fax order

## 2022-11-05 NOTE — Telephone Encounter (Signed)
Faxed order and received an okay confirmation.

## 2022-11-05 NOTE — Telephone Encounter (Signed)
Picked up and filled out as much as possible and placed in Dr. Purvis Sheffield needs to be signed basket.

## 2022-11-05 NOTE — Telephone Encounter (Signed)
Received [employer] sleep therapy order form and placed in Dr. Purvis Sheffield needs to be signed basket

## 2022-11-05 NOTE — Progress Notes (Signed)
11/05/2022 Name: [name] MRN: [id number] DOB: 12-14-1950  Chief Complaint  Patient presents with   Medication Management   Diabetes    [name] is a 72 y.o. year old male who presented for a telephone visit.   They were referred to the pharmacist by their PCP for assistance in managing diabetes.    Subjective:  Care Team: Primary Care Provider: Glori Luis, MD ; Next Scheduled Visit: 01/13/23  Medication Access/Adherence  Current Pharmacy:  [facility] - [address] [place] - [address] [place] [address] Phone: [phone] Fax: [phone]  [id number] - [address], [place] - [address] [facility] [address] [place] [address] Phone: [phone] Fax: [phone]  [id number] [place], [place] - [address] [place] [address] Phone: [phone] Fax: [phone]   Patient reports affordability concerns with their medications: No  Patient reports access/transportation concerns to their pharmacy: No  Patient reports adherence concerns with their medications:  No     Diabetes:  Current medications: metformin 1000 mg twice daily, Lantus 30 units daily, Ozempic 1 mg daily  Previously on Jardiance - multiple UTIs  Current glucose readings: fasting: 110-150s; though one reading of 51; has not checked any post prandial readings as he does not want to test more than once daily  Patient reports hypoglycemic s/sx including dizziness, shakiness, sweating periodically, and only if he goes a while without eating.   Notes that he is very frustrated he cannot have orthopedic surgery yet  Objective:  Lab Results  Component Value Date   HGBA1C 9.4 (H) 09/17/2022    Lab Results  Component Value Date   CREATININE 1.19 06/30/2022   BUN 18 06/30/2022   NA 133 (L) 06/30/2022   K 4.7 06/30/2022   CL 96 (L) 06/30/2022   CO2 26  06/30/2022    Lab Results  Component Value Date   CHOL 89 04/02/2022   HDL 35 (L) 04/02/2022   LDLCALC 36 04/02/2022   LDLDIRECT 71.0 07/11/2019   TRIG 96 04/02/2022   CHOLHDL 2.5 04/02/2022    Medications Reviewed Today     Reviewed by Alden Hipp, RPH-CPP (Pharmacist) on 11/05/22 at 1037  Med List Status: <None>   Medication Order Taking? Sig Documenting Provider Last Dose Status Informant  aspirin EC 325 MG EC tablet [id number]  Take 1 tablet (325 mg total) by mouth daily. Ardelle Balls, PA-C  Active Self  atorvastatin (LIPITOR) 80 MG tablet [id number]  Take 1 tablet (80 mg total) by mouth daily. Antonieta Iba, MD  Active Self  blood glucose meter kit and supplies KIT [id number]  Dispense based on patient and insurance preference. Use up to four times daily as directed. Tyrone Nine, MD  Active Self  carvedilol (COREG) 6.25 MG tablet [id number]  Take 1 tablet (6.25 mg total) by mouth 2 (two) times daily with a meal. Gollan, Tollie Pizza, MD  Active Self  ezetimibe (ZETIA) 10 MG tablet [id number]  Take 1 tablet (10 mg total) by mouth daily. Antonieta Iba, MD  Active Self  furosemide (LASIX) 40 MG tablet [id number]  Take 1 tablet (40 mg total) by mouth daily. Antonieta Iba, MD  Active Self  insulin glargine (LANTUS) 100 UNIT/ML Solostar Pen [id number] Yes Inject 30 Units into the skin daily. Glori Luis, MD Taking Active   Insulin Pen Needle 31G X 5 MM  MISC [id number]  Inject insulin via insulin pen daily Tyrone Nine, MD  Active Self  isosorbide mononitrate (IMDUR) 30 MG 24 hr tablet [id number]  Take 1 tablet (30 mg total) by mouth daily. Antonieta Iba, MD  Active Self  losartan (COZAAR) 25 MG tablet [id number]  Take 1 tablet (25 mg total) by mouth daily. Antonieta Iba, MD  Active Self  metFORMIN (GLUCOPHAGE) 1000 MG tablet [id number] Yes TAKE 1 TABLET TWICE A DAY WITH MEALS Glori Luis, MD Taking Active Self  Multiple Vitamins-Minerals  (MULTIVITAMINS THER. W/MINERALS) TABS [id number]  Take 1 tablet by mouth every morning. [provider]  Active Self  niacin (NIASPAN) 1000 MG CR tablet [id number]  Take 2 tablets (2000 mg) by mouth once daily at bedtime Antonieta Iba, MD  Active Self  NON FORMULARY [id number]  Take 1-2 tablets by mouth daily as needed (Pain). CBD Gummy [provider]  Active Self  potassium chloride SA (KLOR-CON M) 20 MEQ tablet [id number]  Take 1 tablet (20 mEq total) by mouth daily. Antonieta Iba, MD  Active Self  Semaglutide, 1 MG/DOSE, 4 MG/3ML SOPN [id number] Yes Inject 1 mg as directed once a week. Glori Luis, MD Taking Active               Assessment/Plan:   Diabetes: - Currently uncontrolled but improved - Reviewed long term cardiovascular and renal outcomes of uncontrolled blood sugar - Reviewed goal A1c, goal fasting, and goal 2 hour post prandial glucose. Will  - Recommend to continue current regimen. Has a 2 month supply of Ozempic 1 mg remaining, so will defer increasing dose at this time. Patient agrees to alternate checking fasting and 2 hour post prandial to evaluate mealtime control.  - In the meantime, will reach out to Ortho to see if a fructosamine (if improved control) would be an acceptable alternative to waiting until September for next A1c    Follow Up Plan: follow up in ~ 6 weeks  Catie Eppie Gibson, PharmD, BCACP, CPP Clinical Pharmacist [facility] [phone]

## 2022-11-08 ENCOUNTER — Other Ambulatory Visit: Payer: Self-pay | Admitting: Family Medicine

## 2022-11-19 DIAGNOSIS — E119 Type 2 diabetes mellitus without complications: Secondary | ICD-10-CM | POA: Diagnosis not present

## 2022-11-19 DIAGNOSIS — H43813 Vitreous degeneration, bilateral: Secondary | ICD-10-CM | POA: Diagnosis not present

## 2022-11-19 DIAGNOSIS — H2513 Age-related nuclear cataract, bilateral: Secondary | ICD-10-CM | POA: Diagnosis not present

## 2022-11-19 LAB — HM DIABETES EYE EXAM

## 2022-11-20 ENCOUNTER — Ambulatory Visit: Payer: Medicare Other | Admitting: Dermatology

## 2022-12-25 ENCOUNTER — Ambulatory Visit (INDEPENDENT_AMBULATORY_CARE_PROVIDER_SITE_OTHER): Payer: Medicare Other | Admitting: Emergency Medicine

## 2022-12-25 ENCOUNTER — Encounter (INDEPENDENT_AMBULATORY_CARE_PROVIDER_SITE_OTHER): Payer: Self-pay

## 2022-12-25 VITALS — Ht 68.0 in | Wt 185.0 lb

## 2022-12-25 DIAGNOSIS — Z794 Long term (current) use of insulin: Secondary | ICD-10-CM

## 2022-12-25 DIAGNOSIS — Z0000 Encounter for general adult medical examination without abnormal findings: Secondary | ICD-10-CM | POA: Diagnosis not present

## 2022-12-25 DIAGNOSIS — E119 Type 2 diabetes mellitus without complications: Secondary | ICD-10-CM

## 2022-12-25 DIAGNOSIS — Z1211 Encounter for screening for malignant neoplasm of colon: Secondary | ICD-10-CM

## 2022-12-25 NOTE — Progress Notes (Signed)
Subjective:   [name] is a 72 y.o. male who presents for Medicare Annual/Subsequent preventive examination.  Visit Complete: Virtual  I connected with  [name] on 12/25/22 by a audio enabled telemedicine application and verified that I am speaking with the correct person using two identifiers.  Patient Location: Home  Provider Location: Home Office  I discussed the limitations of evaluation and management by telemedicine. The patient expressed understanding and agreed to proceed.  Vital Signs: Unable to obtain new vitals due to this being a telehealth visit. Patient reported height and weight.   Review of Systems     Cardiac Risk Factors include: advanced age (>36men, >21 women);male gender;diabetes mellitus;dyslipidemia;hypertension;Other (see comment), Risk factor comments: OSA cpap     Objective:    Today's Vitals   12/25/22 0902  Weight: 185 lb (83.9 kg)  Height: 5\' 8"  (1.727 m)   Body mass index is 28.13 kg/m.     12/25/2022    9:30 AM 09/29/2022   10:33 AM 06/30/2022    8:35 AM 03/05/2022   10:22 AM 12/09/2021    1:28 PM 10/22/2021    7:08 AM 10/21/2021   10:49 PM  Advanced Directives  Does Patient Have a Medical Advance Directive? Yes No No Yes Yes  No  Type of Estate agent of [place];Living will   Healthcare Power of [place];Living will Healthcare Power of [place];Living will    Does patient want to make changes to medical advance directive? No - Patient declined No - Patient declined  No - Patient declined No - Patient declined    Copy of Healthcare Power of Attorney in Chart? No - copy requested   No - copy requested No - copy requested    Would patient like information on creating a medical advance directive?  No - Patient declined No - Patient declined   No - Patient declined     Current Medications (verified) Outpatient Encounter Medications as of 12/25/2022  Medication Sig   aspirin EC 325 MG EC tablet  Take 1 tablet (325 mg total) by mouth daily.   atorvastatin (LIPITOR) 80 MG tablet Take 1 tablet (80 mg total) by mouth daily.   blood glucose meter kit and supplies KIT Dispense based on patient and insurance preference. Use up to four times daily as directed.   carvedilol (COREG) 6.25 MG tablet Take 1 tablet (6.25 mg total) by mouth 2 (two) times daily with a meal.   ezetimibe (ZETIA) 10 MG tablet Take 1 tablet (10 mg total) by mouth daily.   furosemide (LASIX) 40 MG tablet Take 1 tablet (40 mg total) by mouth daily.   insulin glargine (LANTUS) 100 UNIT/ML Solostar Pen Inject 30 Units into the skin daily.   Insulin Pen Needle 31G X 5 MM MISC Inject insulin via insulin pen daily   isosorbide mononitrate (IMDUR) 30 MG 24 hr tablet Take 1 tablet (30 mg total) by mouth daily.   losartan (COZAAR) 25 MG tablet Take 1 tablet (25 mg total) by mouth daily.   Magnesium 250 MG TABS Take 1 tablet by mouth daily.   metFORMIN (GLUCOPHAGE) 1000 MG tablet TAKE 1 TABLET TWICE A DAY WITH MEALS   Multiple Vitamins-Minerals (MULTIVITAMINS THER. W/MINERALS) TABS Take 1 tablet by mouth every morning.   niacin (NIASPAN) 1000 MG CR tablet Take 2 tablets (2000 mg) by mouth once daily at bedtime   NON FORMULARY Take 1-2 tablets by mouth daily as needed (Pain). CBD Gummy  potassium chloride SA (KLOR-CON M) 20 MEQ tablet Take 1 tablet (20 mEq total) by mouth daily.   Semaglutide, 1 MG/DOSE, 4 MG/3ML SOPN Inject 1 mg as directed once a week.   No facility-administered encounter medications on file as of 12/25/2022.    Allergies (verified) Hydroxyzine hcl and Latex   History: Past Medical History:  Diagnosis Date   Actinic keratoses    Angina    Asthma    BCC (basal cell carcinoma of skin) 09/06/2020   left upper back lateral (EDC) ,  left upper arm anterior (EDC), left upper back medial (EDC)    Bursitis of left elbow 2018   Diabetes mellitus without complication (HCC)    ED (erectile dysfunction)    HTN  (hypertension) 04/12/2011   Hyperlipemia 04/12/2011   Ischemic cardiomyopathy    a. echo 2010: EF 45-50%, mild to mod ant and apical wall HK, trace MR; b. cardiac cath 06/2012: EF 40%, mild MR    Multiple vessel coronary artery disease 04/12/2011   a. remote MI 1996; b. MI 1997 s/p PCI - LAD & diag; c. MI 2009: long stenting P-MLAD & Diag & POBA of jailed diag branch; d. cath 2010: 80% ISR of LAD w/ L-R collats, med Rx; e. cath 2012: 50-60% tub mLAD, 60-70% dLAD, FFR 0.75 -->s/p PCI dissection => w/ 2 stents (3 total); f. cath 06/2012: no changes from 2012 cath, med Rx, patent stents -> MV CAD 11/20/20 --> CABG X 5   Myocardial infarction (HCC)    x 5   Obesity    OSA (obstructive sleep apnea)    on CPAP   Osteoarthritis    Renal disorder    kidney stone   Past Surgical History:  Procedure Laterality Date   CORONARY ANGIOPLASTY     x 5   CORONARY ARTERY BYPASS GRAFT N/A 11/20/2020   Procedure: CORONARY ARTERY BYPASS GRAFTING (CABG) x  FIVE (LIMA-dLAD, SVG-PDA, SVG-D1, SeqSVG-OM1-2) ON PUMP  USING LEFT INTERNAL MAMMARY ARTERTY AND LEFT ENDOSCOPIC GREATER SAPHENOUS VEIN CONDUITS, RIGHT LEG OPENED NOT HARVESTED;  Surgeon: Linden Dolin, MD;  Location: [facility] OR;  Service: Open Heart Surgery;  Laterality: N/A;   CORONARY PRESSURE/FFR STUDY N/A 11/15/2020   Procedure: INTRAVASCULAR PRESSURE WIRE/FFR STUDY;  Surgeon: Yvonne Kendall, MD;  Location: [facility] INVASIVE CV LAB;  Service: Cardiovascular;  Laterality: N/A;   CORONARY STENT PLACEMENT     x 7   LEFT HEART CATH AND CORONARY ANGIOGRAPHY N/A 11/15/2020   Procedure: LEFT HEART CATH AND CORONARY ANGIOGRAPHY;  Surgeon: Yvonne Kendall, MD;  Location: [facility] INVASIVE CV LAB;  Service: Cardiovascular;  Laterality: N/A;   LEFT HEART CATHETERIZATION WITH CORONARY ANGIOGRAM N/A 04/14/2011   Procedure: LEFT HEART CATHETERIZATION WITH CORONARY ANGIOGRAM;  Surgeon: Chrystie Nose, MD;  Location: [facility] CATH LAB;  Service: Cardiovascular;  Laterality: N/A;    LEFT HEART CATHETERIZATION WITH CORONARY ANGIOGRAM N/A 06/25/2012   Procedure: LEFT HEART CATHETERIZATION WITH CORONARY ANGIOGRAM;  Surgeon: Peter M [place]az[place]and, MD;  Location: [facility] CATH LAB;  Service: Cardiovascular;  Laterality: N/A;   TEE WITHOUT CARDIOVERSION N/A 11/20/2020   Procedure: TRANSESOPHAGEAL ECHOCARDIOGRAM (TEE);  Surgeon: Linden Dolin, MD;  Location: [facility] OR;  Service: Open Heart Surgery;  Laterality: N/A;   TONSILLECTOMY AND ADENOIDECTOMY  1955   Family History  Problem Relation Age of Onset   COPD Mother    Other Father        muscle tumor   Stroke Father    Prostate cancer Father  mets   Heart disease Father    Alcoholism Father    Stroke Sister    Heart block Sister        Heart bypass   Bladder Cancer Neg Hx    Kidney cancer Neg Hx    Social History   Socioeconomic History   Marital status: Married    Spouse name: Meriam Sprague   Number of children: 2   Years of education: Not on file   Highest education level: Not on file  Occupational History   Occupation: [employer]-retired   Occupation: Airline pilot  Tobacco Use   Smoking status: Former    Current packs/day: 0.00    Average packs/day: 1.5 packs/day for 40.0 years (60.0 ttl pk-yrs)    Types: Cigarettes    Start date: 09/14/1967    Quit date: 09/14/2007    Years since quitting: 15.2   Smokeless tobacco: Never   Tobacco comments:    quit 2009  Vaping Use   Vaping status: Never Used  Substance and Sexual Activity   Alcohol use: No    Alcohol/week: 0.0 standard drinks of alcohol    Comment: rarely 1 beer in last 5 years   Drug use: Not Currently    Types: Marijuana    Comment: pt stopped fall 2012   Sexual activity: Never  Other Topics Concern   Not on file  Social History Narrative   Retired twice    Married to [place], has 2 children from previous marriage   Pet: 3 dogs    Social Determinants of Health   Financial Resource Strain: Low Risk  (12/25/2022)   Overall Financial Resource Strain (CARDIA)     Difficulty of Paying Living Expenses: Not very hard  Food Insecurity: No Food Insecurity (12/25/2022)   Hunger Vital Sign    Worried About Running Out of Food in the Last Year: Never true    Ran Out of Food in the Last Year: Never true  Transportation Needs: No Transportation Needs (12/25/2022)   PRAPARE - Administrator, Civil Service (Medical): No    Lack of Transportation (Non-Medical): No  Physical Activity: Sufficiently Active (12/25/2022)   Exercise Vital Sign    Days of Exercise per Week: 7 days    Minutes of Exercise per Session: 30 min  Stress: No Stress Concern Present (12/25/2022)   [employer] of Occupational Health - Occupational Stress Questionnaire    Feeling of Stress : Not at all  Social Connections: Moderately Integrated (12/25/2022)   Social Connection and Isolation Panel [NHANES]    Frequency of Communication with Friends and Family: Twice a week    Frequency of Social Gatherings with Friends and Family: More than three times a week    Attends Religious Services: Never    Database administrator or Organizations: Yes    Attends Banker Meetings: Never    Marital Status: Married    Tobacco Counseling Counseling given: Not Answered Tobacco comments: quit 2009   Clinical Intake:  Pre-visit preparation completed: Yes  Pain : No/denies pain     BMI - recorded: 28.13 Nutritional Status: BMI 25 -29 Overweight Nutritional Risks: None Diabetes: Yes CBG done?: No Did pt. bring in CBG monitor from home?: No  How often do you need to have someone help you when you read instructions, pamphlets, or other written materials from your doctor or pharmacy?: 1 - Never  Interpreter Needed?: No  Information entered by :: Tora Kindred, CMA   Activities of Daily  Living    12/25/2022    9:07 AM 06/30/2022    8:40 AM  In your present state of health, do you have any difficulty performing the following activities:  Hearing? 0   Vision? 0    Difficulty concentrating or making decisions? 0   Walking or climbing stairs? 0   Dressing or bathing? 0   Doing errands, shopping? 0 0  Preparing Food and eating ? N   Using the Toilet? N   In the past six months, have you accidently leaked urine? N   Do you have problems with loss of bowel control? N   Managing your Medications? N   Managing your Finances? N   Housekeeping or managing your Housekeeping? N     Patient Care Team: Glori Luis, MD as PCP - General (Family Medicine) Mariah Milling Tollie Pizza, MD as PCP - Cardiology (Cardiology) Antonieta Iba, MD as Consulting Physician (Cardiology) Glori Luis, MD as Consulting Physician (Family Medicine) Kieth Brightly, MD (General Surgery)  Indicate any recent Medical Services you may have received from other than [facility] providers in the past year (date may be approximate).     Assessment:   This is a routine wellness examination for [place].  Hearing/Vision screen Hearing Screening - Comments:: Denies hearing loss Vision Screening - Comments:: Getting yearly eye exams  Dietary issues and exercise activities discussed:     Goals Addressed               This Visit's Progress     Patient Stated (pt-stated)        Want to get shoulder surgery, which means controlling his diabetes.      Depression Screen    12/25/2022    9:25 AM 10/10/2022    4:01 PM 09/19/2022   10:28 AM 08/22/2022   12:51 PM 06/30/2022    4:09 PM 04/02/2022    2:26 PM 10/02/2021   11:39 AM  PHQ 2/9 Scores  PHQ - 2 Score 0 0 0 2 2 2  0  PHQ- 9 Score 0 4  7 4 7      Fall Risk    12/25/2022    9:32 AM 09/19/2022   10:28 AM 06/30/2022    4:09 PM 04/02/2022    2:28 PM 12/31/2021    9:20 AM  Fall Risk   Falls in the past year? 0 0 0 0 0  Number falls in past yr: 0 0 0 0 0  Injury with Fall? 0 0 0 0 0  Risk for fall due to : No Fall Risks  No Fall Risks No Fall Risks No Fall Risks  Follow up Falls prevention discussed  Falls  evaluation completed Falls evaluation completed Falls evaluation completed    MEDICARE RISK AT HOME:  Medicare Risk at Home - 12/25/22 0933     Any stairs in or around the home? Yes    If so, are there any without handrails? No    Home free of loose throw rugs in walkways, pet beds, electrical cords, etc? Yes    Adequate lighting in your home to reduce risk of falls? Yes    Life alert? No    Use of a cane, walker or w/c? No    Grab bars in the bathroom? No    Shower chair or bench in shower? No    Elevated toilet seat or a handicapped toilet? No             TIMED UP  AND GO:  Was the test performed?  No    Cognitive Function:    06/03/2018    9:48 AM  MMSE - Mini Mental State Exam  Orientation to time 5  Orientation to Place 5  Registration 3  Attention/ Calculation 5  Recall 3  Language- name 2 objects 2  Language- repeat 1  Language- follow 3 step command 3  Language- read & follow direction 1  Write a sentence 1  Copy design 1  Total score 30        12/25/2022    9:34 AM 06/06/2019    9:00 AM  6CIT Screen  What Year? 0 points 0 points  What month? 0 points 0 points  What time? 0 points 0 points  Count back from 20 0 points 0 points  Months in reverse 0 points 0 points  Repeat phrase 0 points 0 points  Total Score 0 points 0 points    Immunizations Immunization History  Administered Date(s) Administered   Fluad Quad(high Dose 65+) 03/20/2021   Influenza, High Dose Seasonal PF 02/07/2016, 01/26/2018   Influenza,inj,Quad PF,6+ Mos 03/13/2014, 02/26/2015, 12/31/2021   Moderna Sars-Covid-2 Vaccination 08/08/2019, 09/05/2019, 03/12/2020   Pneumococcal Conjugate-13 06/03/2018   Pneumococcal Polysaccharide-23 03/20/2021   Zoster, Live 05/13/2011    TDAP status: Due, Education has been provided regarding the importance of this vaccine. Advised may receive this vaccine at local pharmacy or Health Dept. Aware to provide a copy of the vaccination record if  obtained from local pharmacy or Health Dept. Verbalized acceptance and understanding.  Flu Vaccine status: Due, Education has been provided regarding the importance of this vaccine. Advised may receive this vaccine at local pharmacy or Health Dept. Aware to provide a copy of the vaccination record if obtained from local pharmacy or Health Dept. Verbalized acceptance and understanding.  Pneumococcal vaccine status: Up to date  Covid-19 vaccine status: Information provided on how to obtain vaccines.   Qualifies for Shingles Vaccine? Yes   Zostavax completed Yes   Shingrix Completed?: No.    Education has been provided regarding the importance of this vaccine. Patient has been advised to call insurance company to determine out of pocket expense if they have not yet received this vaccine. Advised may also receive vaccine at local pharmacy or Health Dept. Verbalized acceptance and understanding.  Screening Tests Health Maintenance  Topic Date Due   DTaP/Tdap/Td (1 - Tdap) Never done   Zoster Vaccines- Shingrix (1 of 2) 11/20/1969   Colonoscopy  11/08/2016   COVID-19 Vaccine (4 - 2023-24 season) 01/10/2022   INFLUENZA VACCINE  12/11/2022   HEMOGLOBIN A1C  03/20/2023   Diabetic kidney evaluation - Urine ACR  04/03/2023   Diabetic kidney evaluation - eGFR measurement  07/01/2023   FOOT EXAM  10/10/2023   OPHTHALMOLOGY EXAM  11/19/2023   Medicare Annual Wellness (AWV)  12/25/2023   Pneumonia Vaccine 58+ Years old  Completed   Hepatitis C Screening  Completed   HPV VACCINES  Aged Out   Lung Cancer Screening  Discontinued    Health Maintenance  Health Maintenance Due  Topic Date Due   DTaP/Tdap/Td (1 - Tdap) Never done   Zoster Vaccines- Shingrix (1 of 2) 11/20/1969   Colonoscopy  11/08/2016   COVID-19 Vaccine (4 - 2023-24 season) 01/10/2022   INFLUENZA VACCINE  12/11/2022    Colorectal cancer screening: Type of screening: Colonoscopy. Completed 11/08/13. Repeat every 3 years Referral  placed to GI  Lung Cancer Screening: (Low Dose CT Chest  recommended if Age 63-80 years, 20 pack-year currently smoking OR have quit w/in 15years.) does not qualify.   Lung Cancer Screening Referral: n/a  Additional Screening:  Hepatitis C Screening: does not qualify; Completed 05/27/16  Vision Screening: Recommended annual ophthalmology exams for early detection of glaucoma and other disorders of the eye. Is the patient up to date with their annual eye exam?  Yes  Who is the provider or what is the name of the office in which the patient attends annual eye exams? [facility], [place] If pt is not established with a provider, would they like to be referred to a provider to establish care? No .   Dental Screening: Recommended annual dental exams for proper oral hygiene  Diabetic Foot Exam: Diabetic Foot Exam: Completed 10/10/22  Community Resource Referral / Chronic Care Management: CRR required this visit?  No   CCM required this visit?  No     Plan:     I have personally reviewed and noted the following in the patient's chart:   Medical and social history Use of alcohol, tobacco or illicit drugs  Current medications and supplements including opioid prescriptions. Patient is not currently taking opioid prescriptions. Functional ability and status Nutritional status Physical activity Advanced directives List of other physicians Hospitalizations, surgeries, and ER visits in previous 12 months Vitals Screenings to include cognitive, depression, and falls Referrals and appointments  In addition, I have reviewed and discussed with patient certain preventive protocols, quality metrics, and best practice recommendations. A written personalized care plan for preventive services as well as general preventive health recommendations were provided to patient.     Tora Kindred, CMA   12/25/2022   After Visit Summary: (MyChart) Due to this being a telephonic visit, the  after visit summary with patients personalized plan was offered to patient via MyChart   Nurse Notes:  Referral placed to DM & Nutrition education. Referral placed to GI for colonoscopy. Needs tdap Will get flu shot in the fall and covid booster when available.

## 2022-12-25 NOTE — Patient Instructions (Addendum)
[name] , Thank you for taking time to come for your Medicare Wellness Visit. I appreciate your ongoing commitment to your health goals. Please review the following plan we discussed and let me know if I can assist you in the future.   Referrals/Orders/Follow-Ups/Clinician Recommendations: Get your flu shot in the fall and a covid booster when available in 6-8 weeks. You are due for a tetanus shot. I have placed a referral to Diabetes & Nutrition education and to GI for a colonoscopy. Someone should call you to schedule these appointments.  This is a list of the screening recommended for you and due dates:  Health Maintenance  Topic Date Due   DTaP/Tdap/Td vaccine (1 - Tdap) Never done   Zoster (Shingles) Vaccine (1 of 2) [birth year]/20/1969   Colon Cancer Screening  11/08/2016   COVID-19 Vaccine (4 - 2023-24 season) 01/10/2022   Flu Shot  12/11/2022   Hemoglobin A1C  03/20/2023   Yearly kidney health urinalysis for diabetes  04/03/2023   Yearly kidney function blood test for diabetes  07/01/2023   Complete foot exam   10/10/2023   Eye exam for diabetics  11/19/2023   Medicare Annual Wellness Visit  12/25/2023   Pneumonia Vaccine  Completed   Hepatitis C Screening  Completed   HPV Vaccine  Aged Out   Screening for Lung Cancer  Discontinued    Advanced directives: (ACP Link)Information on Advanced Care Planning can be found at [facility] [place] Advance Health Care Directives Advance Health Care Directives ([email or web address]   Please bring a copy of your health care power of attorney and living will to the office to be added to your chart at your convenience.   Next Medicare Annual Wellness Visit scheduled for next year: Yes, 12/31/23 @ 9am  Preventive Care 65 Years and Older, Male  Preventive care refers to lifestyle choices and visits with your health care provider that can promote health and wellness. What does preventive care include? A yearly physical exam. This is also  called an annual well check. Dental exams once or twice a year. Routine eye exams. Ask your health care provider how often you should have your eyes checked. Personal lifestyle choices, including: Daily care of your teeth and gums. Regular physical activity. Eating a healthy diet. Avoiding tobacco and drug use. Limiting alcohol use. Practicing safe sex. Taking low doses of aspirin every day. Taking vitamin and mineral supplements as recommended by your health care provider. What happens during an annual well check? The services and screenings done by your health care provider during your annual well check will depend on your age, overall health, lifestyle risk factors, and family history of disease. Counseling  Your health care provider may ask you questions about your: Alcohol use. Tobacco use. Drug use. Emotional well-being. Home and relationship well-being. Sexual activity. Eating habits. History of falls. Memory and ability to understand (cognition). Work and work Astronomer. Screening  You may have the following tests or measurements: Height, weight, and BMI. Blood pressure. Lipid and cholesterol levels. These may be checked every 5 years, or more frequently if you are over 64 years old. Skin check. Lung cancer screening. You may have this screening every year starting at age 43 if you have a 30-pack-year history of smoking and currently smoke or have quit within the past 15 years. Fecal occult blood test (FOBT) of the stool. You may have this test every year starting at age 69. Flexible sigmoidoscopy or colonoscopy. You may have a  sigmoidoscopy every 5 years or a colonoscopy every 10 years starting at age 2. Prostate cancer screening. Recommendations will vary depending on your family history and other risks. Hepatitis C blood test. Hepatitis B blood test. Sexually transmitted disease (STD) testing. Diabetes screening. This is done by checking your blood sugar (glucose)  after you have not eaten for a while (fasting). You may have this done every 1-3 years. Abdominal aortic aneurysm (AAA) screening. You may need this if you are a current or former smoker. Osteoporosis. You may be screened starting at age 85 if you are at high risk. Talk with your health care provider about your test results, treatment options, and if necessary, the need for more tests. Vaccines  Your health care provider may recommend certain vaccines, such as: Influenza vaccine. This is recommended every year. Tetanus, diphtheria, and acellular pertussis (Tdap, Td) vaccine. You may need a Td booster every 10 years. Zoster vaccine. You may need this after age 19. Pneumococcal 13-valent conjugate (PCV13) vaccine. One dose is recommended after age 68. Pneumococcal polysaccharide (PPSV23) vaccine. One dose is recommended after age 26. Talk to your health care provider about which screenings and vaccines you need and how often you need them. This information is not intended to replace advice given to you by your health care provider. Make sure you discuss any questions you have with your health care provider. Document Released: 05/25/2015 Document Revised: 01/16/2016 Document Reviewed: 02/27/2015 Elsevier Interactive Patient Education  2017 [employer].  Fall Prevention in the Home Falls can cause injuries. They can happen to people of all ages. There are many things you can do to make your home safe and to help prevent falls. What can I do on the outside of my home? Regularly fix the edges of walkways and driveways and fix any cracks. Remove anything that might make you trip as you walk through a door, such as a raised step or threshold. Trim any bushes or trees on the path to your home. Use bright outdoor lighting. Clear any walking paths of anything that might make someone trip, such as rocks or tools. Regularly check to see if handrails are loose or broken. Make sure that both sides of any  steps have handrails. Any raised decks and porches should have guardrails on the edges. Have any leaves, snow, or ice cleared regularly. Use sand or salt on walking paths during winter. Clean up any spills in your garage right away. This includes oil or grease spills. What can I do in the bathroom? Use night lights. Install grab bars by the toilet and in the tub and shower. Do not use towel bars as grab bars. Use non-skid mats or decals in the tub or shower. If you need to sit down in the shower, use a plastic, non-slip stool. Keep the floor dry. Clean up any water that spills on the floor as soon as it happens. Remove soap buildup in the tub or shower regularly. Attach bath mats securely with double-sided non-slip rug tape. Do not have throw rugs and other things on the floor that can make you trip. What can I do in the bedroom? Use night lights. Make sure that you have a light by your bed that is easy to reach. Do not use any sheets or blankets that are too big for your bed. They should not hang down onto the floor. Have a firm chair that has side arms. You can use this for support while you get dressed. Do not  have throw rugs and other things on the floor that can make you trip. What can I do in the kitchen? Clean up any spills right away. Avoid walking on wet floors. Keep items that you use a lot in easy-to-reach places. If you need to reach something above you, use a strong step stool that has a grab bar. Keep electrical cords out of the way. Do not use floor polish or wax that makes floors slippery. If you must use wax, use non-skid floor wax. Do not have throw rugs and other things on the floor that can make you trip. What can I do with my stairs? Do not leave any items on the stairs. Make sure that there are handrails on both sides of the stairs and use them. Fix handrails that are broken or loose. Make sure that handrails are as long as the stairways. Check any carpeting to  make sure that it is firmly attached to the stairs. Fix any carpet that is loose or worn. Avoid having throw rugs at the top or bottom of the stairs. If you do have throw rugs, attach them to the floor with carpet tape. Make sure that you have a light switch at the top of the stairs and the bottom of the stairs. If you do not have them, ask someone to add them for you. What else can I do to help prevent falls? Wear shoes that: Do not have high heels. Have rubber bottoms. Are comfortable and fit you well. Are closed at the toe. Do not wear sandals. If you use a stepladder: Make sure that it is fully opened. Do not climb a closed stepladder. Make sure that both sides of the stepladder are locked into place. Ask someone to hold it for you, if possible. Clearly mark and make sure that you can see: Any grab bars or handrails. First and last steps. Where the edge of each step is. Use tools that help you move around (mobility aids) if they are needed. These include: Canes. Walkers. Scooters. Crutches. Turn on the lights when you go into a dark area. Replace any light bulbs as soon as they burn out. Set up your furniture so you have a clear path. Avoid moving your furniture around. If any of your floors are uneven, fix them. If there are any pets around you, be aware of where they are. Review your medicines with your doctor. Some medicines can make you feel dizzy. This can increase your chance of falling. Ask your doctor what other things that you can do to help prevent falls. This information is not intended to replace advice given to you by your health care provider. Make sure you discuss any questions you have with your health care provider. Document Released: 02/22/2009 Document Revised: 10/04/2015 Document Reviewed: 06/02/2014 Elsevier Interactive Patient Education  2017 [employer].

## 2022-12-26 ENCOUNTER — Telehealth: Payer: Self-pay

## 2022-12-26 NOTE — Telephone Encounter (Signed)
Patient is calling to schedule his colonoscopy that we received a referral for. Please call patient back to schedule

## 2022-12-30 ENCOUNTER — Encounter: Payer: Self-pay | Admitting: Dermatology

## 2022-12-30 ENCOUNTER — Ambulatory Visit: Payer: Medicare Other | Admitting: Dermatology

## 2022-12-30 VITALS — BP 132/66 | HR 77

## 2022-12-30 DIAGNOSIS — Z85828 Personal history of other malignant neoplasm of skin: Secondary | ICD-10-CM | POA: Diagnosis not present

## 2022-12-30 DIAGNOSIS — B360 Pityriasis versicolor: Secondary | ICD-10-CM

## 2022-12-30 DIAGNOSIS — W908XXA Exposure to other nonionizing radiation, initial encounter: Secondary | ICD-10-CM | POA: Diagnosis not present

## 2022-12-30 DIAGNOSIS — Z1283 Encounter for screening for malignant neoplasm of skin: Secondary | ICD-10-CM

## 2022-12-30 DIAGNOSIS — L578 Other skin changes due to chronic exposure to nonionizing radiation: Secondary | ICD-10-CM | POA: Diagnosis not present

## 2022-12-30 DIAGNOSIS — B079 Viral wart, unspecified: Secondary | ICD-10-CM | POA: Diagnosis not present

## 2022-12-30 DIAGNOSIS — L821 Other seborrheic keratosis: Secondary | ICD-10-CM | POA: Diagnosis not present

## 2022-12-30 DIAGNOSIS — L814 Other melanin hyperpigmentation: Secondary | ICD-10-CM | POA: Diagnosis not present

## 2022-12-30 DIAGNOSIS — L219 Seborrheic dermatitis, unspecified: Secondary | ICD-10-CM

## 2022-12-30 DIAGNOSIS — D1801 Hemangioma of skin and subcutaneous tissue: Secondary | ICD-10-CM

## 2022-12-30 DIAGNOSIS — D229 Melanocytic nevi, unspecified: Secondary | ICD-10-CM

## 2022-12-30 DIAGNOSIS — L570 Actinic keratosis: Secondary | ICD-10-CM | POA: Diagnosis not present

## 2022-12-30 DIAGNOSIS — L910 Hypertrophic scar: Secondary | ICD-10-CM

## 2022-12-30 MED ORDER — KETOCONAZOLE 2 % EX CREA: TOPICAL_CREAM | CUTANEOUS | 3 refills | Status: AC

## 2022-12-30 NOTE — Progress Notes (Signed)
Follow-Up Visit   Subjective  [name] is a 72 y.o. male who presents for the following: Skin Cancer Screening and Full Body Skin Exam  The patient presents for Total-Body Skin Exam (TBSE) for skin cancer screening and mole check. The patient has spots, moles and lesions to be evaluated, some may be new or changing and the patient may have concern these could be cancer. He has a wart on his right thumb that has been treated in the past with biopsy, cryotherapy, Cantharidin, and Salicylic acid. Wart has not improved after treatments. He has a history of BCC of the left upper back lateral, left upper back medial, and left upper arm anterior. Scars on the back are itchy. Also a pink spot on his right upper arm, itchy. History of AKs.   The following portions of the chart were reviewed this encounter and updated as appropriate: medications, allergies, medical history  Review of Systems:  No other skin or systemic complaints except as noted in HPI or Assessment and Plan.  Objective  Well appearing patient in no apparent distress; mood and affect are within normal limits.  A full examination was performed including scalp, head, eyes, ears, nose, lips, neck, chest, axillae, abdomen, back, buttocks, bilateral upper extremities, bilateral lower extremities, hands, feet, fingers, toes, fingernails, and toenails. All findings within normal limits unless otherwise noted below.   Relevant physical exam findings are noted in the Assessment and Plan.  Left Upper Back Hypertrophic scar.  R parietal scalp x 3, L cheek x 4, forehead x 4, L perinasal x 1, R nasal sidewall x 4, R temple x 1, R preauricular x 1 (18) Erythematous thin papules/macules with gritty scale.   Right Thumb Right thumbnail with lateral full-length dystrophy of nail plate, more severe and wider than 09/2021 photo. Increase in subungual hyperkeratosis         Chest - Medial (Center) Pink scaly plaques on central  chest and right upper arm    Assessment & Plan   SKIN CANCER SCREENING PERFORMED TODAY.  ACTINIC DAMAGE - Chronic condition, secondary to cumulative UV/sun exposure - diffuse scaly erythematous macules with underlying dyspigmentation - Recommend daily broad spectrum sunscreen SPF 30+ to sun-exposed areas, reapply every 2 hours as needed.  - Staying in the shade or wearing long sleeves, sun glasses (UVA+UVB protection) and wide brim hats (4-inch brim around the entire circumference of the hat) are also recommended for sun protection.  - Call for new or changing lesions.  LENTIGINES, SEBORRHEIC KERATOSES, HEMANGIOMAS - Benign normal skin lesions - Benign-appearing - Call for any changes  MELANOCYTIC NEVI - Tan-brown and/or pink-flesh-colored symmetric macules and papules - Benign appearing on exam today - Observation - Call clinic for new or changing moles - Recommend daily use of broad spectrum spf 30+ sunscreen to sun-exposed areas.   HISTORY OF BASAL CELL CARCINOMA OF THE SKIN Left upper back lateral and medial; Left upper arm anterior - No evidence of recurrence today - Recommend regular full body skin exams - Recommend daily broad spectrum sunscreen SPF 30+ to sun-exposed areas, reapply every 2 hours as needed.  - Call if any new or changing lesions are noted between office visits  SEBORRHEIC DERMATITIS Exam: Pink patches with greasy scale at eyebrows, glabella, nasolabial  Chronic and persistent condition with duration or expected duration over one year. Condition is bothersome/symptomatic for patient. Currently flared.  Seborrheic Dermatitis is a chronic persistent rash characterized by pinkness and scaling most commonly of the  mid face but also can occur on the scalp (dandruff), ears; mid chest, mid back and groin.  It tends to be exacerbated by stress and cooler weather.  People who have neurologic disease may experience new onset or exacerbation of existing seborrheic  dermatitis.  The condition is not curable but treatable and can be controlled.  Treatment Plan: Start ketoconazole 2% cream Apply to pink, scaly areas of the face twice daily until improved dsp 60g 3Rf.  Apply Zoryve once a day to affected areas. Sample x 1 given.    LENTIGO Exam: tan macule at the right upper eyelid Due to sun exposure Treatment Plan: Benign-appearing, observe. Recommend daily broad spectrum sunscreen SPF 30+ to sun-exposed areas, reapply every 2 hours as needed.  Call for any changes.  Hypertrophic scar Left Upper Back  At site of previous BCC.  Discussed ILK injections, patient defers today.   AK (actinic keratosis) (18) R parietal scalp x 3, L cheek x 4, forehead x 4, L perinasal x 1, R nasal sidewall x 4, R temple x 1, R preauricular x 1  Actinic keratoses are precancerous spots that appear secondary to cumulative UV radiation exposure/sun exposure over time. They are chronic with expected duration over 1 year. A portion of actinic keratoses will progress to squamous cell carcinoma of the skin. It is not possible to reliably predict which spots will progress to skin cancer and so treatment is recommended to prevent development of skin cancer.  Recommend daily broad spectrum sunscreen SPF 30+ to sun-exposed areas, reapply every 2 hours as needed.  Recommend staying in the shade or wearing long sleeves, sun glasses (UVA+UVB protection) and wide brim hats (4-inch brim around the entire circumference of the hat). Call for new or changing lesions.  Destruction of lesion - R parietal scalp x 3, L cheek x 4, forehead x 4, L perinasal x 1, R nasal sidewall x 4, R temple x 1, R preauricular x 1 (18)  Destruction method: cryotherapy   Informed consent: discussed and consent obtained   Lesion destroyed using liquid nitrogen: Yes   Region frozen until ice ball extended beyond lesion: Yes   Outcome: patient tolerated procedure well with no complications   Post-procedure  details: wound care instructions given   Additional details:  Prior to procedure, discussed risks of blister formation, small wound, skin dyspigmentation, or rare scar following cryotherapy. Recommend Vaseline ointment to treated areas while healing.   Viral warts, unspecified type Right Thumb  vs SCC.  Previous biopsy from 09/2021 showed verruca. No improvement after several cryotherapy treatments and topical Cantharidin and Squaric Acid.  Recommend nail matrix biopsy to r/o SCC.  Will determine options for biopsy. [place] vs [place] vs [facility] vs Dr. Kathyrn Sheriff at derm[facility]laser center of [facility] specialized in nail problems, nail malignancies.  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 Advised it can take many months for affected areas to repigment. - If right upper arm lesion does not resolve with 1 month of ketoconazole BID, patient will return for biopsy. Would be concerned for SCCis  Tinea versicolor is a chronic recurrent skin rash causing discolored scaly spots most commonly seen on back, chest, and/or shoulders.  It is generally asymptomatic. The  rash is due to overgrowth of a common type of yeast present on everyone's skin and it is not contagious.  It tends to flare more in the summer due to increased sweating on trunk.  After rash is treated, the scaliness will resolve, but the discoloration will take longer to return to normal pigmentation. The periodic use of an OTC medicated soap/shampoo with zinc or selenium sulfide can be helpful to prevent yeast overgrowth and recurrence.  Seborrheic dermatitis   Return in about 6 months (around 07/02/2023) for AKs.  Wendee Beavers, CMA, am acting as scribe for  Elie Goody, MD .   Documentation: I have reviewed the above documentation for accuracy and completeness, and I agree with the above.  Elie Goody, MD

## 2022-12-30 NOTE — Patient Instructions (Addendum)

## 2023-01-01 ENCOUNTER — Telehealth: Payer: Self-pay | Admitting: Dermatology

## 2023-01-01 DIAGNOSIS — B078 Other viral warts: Secondary | ICD-10-CM

## 2023-01-01 DIAGNOSIS — B079 Viral wart, unspecified: Secondary | ICD-10-CM

## 2023-01-01 NOTE — Telephone Encounter (Signed)
Referral sent to [facility] with comments:  I called and spoke with [facility] earlier this week regarding referral.  Nail BX to rule out malignancy.   Dr. Katrinka Blazing would like patient scheduled with Dr. Elvin So in the [facility] office in November, if possible.  Dr. Katrinka Blazing is also requesting if Dr. Elvin So can contact him by email at [email or web address]  Please let me know if you have any questions.  Thank you!

## 2023-01-01 NOTE — Addendum Note (Signed)
Addended by: Dorathy Daft R on: 01/01/2023 12:23 PM   Modules accepted: Orders

## 2023-01-01 NOTE — Telephone Encounter (Signed)
Discussed with patient by phone that Dr [facility]. Their office will contact patient. Patient prefers to be seen at [facility] location. Cryotherapy sites are irritated and draining but doing ok. All questions answered.

## 2023-01-05 ENCOUNTER — Encounter: Payer: Self-pay | Admitting: *Deleted

## 2023-01-13 ENCOUNTER — Ambulatory Visit: Payer: Medicare Other | Admitting: Family Medicine

## 2023-01-21 ENCOUNTER — Encounter: Payer: Medicare Other | Attending: Family Medicine | Admitting: Dietician

## 2023-01-21 ENCOUNTER — Encounter: Payer: Self-pay | Admitting: Dietician

## 2023-01-21 DIAGNOSIS — Z794 Long term (current) use of insulin: Secondary | ICD-10-CM | POA: Insufficient documentation

## 2023-01-21 DIAGNOSIS — Z713 Dietary counseling and surveillance: Secondary | ICD-10-CM | POA: Insufficient documentation

## 2023-01-21 DIAGNOSIS — E119 Type 2 diabetes mellitus without complications: Secondary | ICD-10-CM | POA: Insufficient documentation

## 2023-01-21 DIAGNOSIS — E1165 Type 2 diabetes mellitus with hyperglycemia: Secondary | ICD-10-CM

## 2023-01-21 NOTE — Progress Notes (Signed)
Diabetes Self-Management Education  Visit Type: First/Initial  Appt. Start Time: 0935 Appt. End Time: 1100  01/21/2023  [name], identified by name and date of birth, is a 72 y.o. male with a diagnosis of Diabetes: Type 2.   ASSESSMENT  There were no vitals taken for this visit. There is no height or weight on file to calculate BMI.  Pt reports needing a R shoulder replacement, but needs improvement in A1c to be eligible, arm currently in sling. Current A1c is 9.4%, down from 12.1% (06/30/2022), pt reports they need to get their A1c under 7.0% Pt reports checking FBG daily, numbers are around 100, may go as high as 150 but very rarely. Pt reports taking Lantus @30u  each day mid-morning, Ozempic on Fridays, and metformin BID on a full stomach. Pt reports rare instances of hypoglycemia, knows to eat bring sugar back up. Pt reports having numbness in toes in both feet, more in the R than L, states they are aware of neuropathy and the potential consequences.    Diabetes Self-Management Education - 01/21/23 1001       Visit Information   Visit Type First/Initial      Initial Visit   Diabetes Type Type 2    Date Diagnosed 2016    Are you currently following a meal plan? No    Are you taking your medications as prescribed? Yes      Health Coping   How would you rate your overall health? Good      Psychosocial Assessment   Patient Belief/Attitude about Diabetes Motivated to manage diabetes    What is the hardest part about your diabetes right now, causing you the most concern, or is the most worrisome to you about your diabetes?   Making healty food and beverage choices;Getting support / problem solving    Self-care barriers Debilitated state due to current medical condition   R Arm in sling   Self-management support Doctor's office;Family    Other persons present Patient    Patient Concerns Nutrition/Meal planning;Glycemic Control;Problem Solving    Special Needs None     Preferred Learning Style No preference indicated    Learning Readiness Ready    How often do you need to have someone help you when you read instructions, pamphlets, or other written materials from your doctor or pharmacy? 1 - Never    What is the last grade level you completed in school? Some college      Pre-Education Assessment   Patient understands the diabetes disease and treatment process. Needs Instruction    Patient understands incorporating nutritional management into lifestyle. Needs Instruction    Patient undertands incorporating physical activity into lifestyle. Needs Instruction    Patient understands using medications safely. Needs Instruction    Patient understands monitoring blood glucose, interpreting and using results Needs Instruction    Patient understands prevention, detection, and treatment of acute complications. Needs Instruction    Patient understands prevention, detection, and treatment of chronic complications. Needs Instruction    Patient understands how to develop strategies to address psychosocial issues. Needs Instruction    Patient understands how to develop strategies to promote health/change behavior. Needs Instruction      Complications   Last HgB A1C per patient/outside source 9.4 %   09/17/2022   How often do you check your blood sugar? 1-2 times/day    Fasting Blood glucose range (mg/dL) 86-578    Number of hypoglycemic episodes per month 1    Can you  tell when your blood sugar is low? Yes    What do you do if your blood sugar is low? Eat someting    Have you had a dilated eye exam in the past 12 months? Yes    Have you had a dental exam in the past 12 months? No    Are you checking your feet? Yes    How many days per week are you checking your feet? 7      Dietary Intake   Breakfast Ham egg and cheese biscuit, 2 cups coffee w/ fat free creamer    Lunch 1/2 Outlaw sub (roast beef, pepperjack cheese, peppers, veggies), water    Snack (afternoon)  coffee    Dinner Chicken nuggets, brussels sprouts, corn on the cob, water    Beverage(s) Coffee, water      Activity / Exercise   Activity / Exercise Type ADL's;Light (walking / raking leaves)   Interrupted by hip pain   How many days per week do you exercise? 0    How many minutes per day do you exercise? 0    Total minutes per week of exercise 0      Patient Education   Previous Diabetes Education No    Disease Pathophysiology Factors that contribute to the development of diabetes;Explored patient's options for treatment of their diabetes    Healthy Eating Plate Method;Role of diet in the treatment of diabetes and the relationship between the three main macronutrients and blood glucose level    Being Active Role of exercise on diabetes management, blood pressure control and cardiac health.    Medications Reviewed patients medication for diabetes, action, purpose, timing of dose and side effects.;Taught/reviewed insulin/injectables, injection, site rotation, insulin/injectables storage and needle disposal.    Monitoring Identified appropriate SMBG and/or A1C goals.    Acute complications Taught prevention, symptoms, and  treatment of hypoglycemia - the 15 rule.    Chronic complications Relationship between chronic complications and blood glucose control;Assessed and discussed foot care and prevention of foot problems    Diabetes Stress and Support Identified and addressed patients feelings and concerns about diabetes    Lifestyle and Health Coping Lifestyle issues that need to be addressed for better diabetes care      Individualized Goals (developed by patient)   Nutrition Follow meal plan discussed;General guidelines for healthy choices and portions discussed    Physical Activity Exercise 3-5 times per week    Medications take my medication as prescribed    Monitoring  Test my blood glucose as discussed    Problem Solving Eating Pattern    Reducing Risk examine blood glucose patterns       Post-Education Assessment   Patient understands the diabetes disease and treatment process. Comprehends key points    Patient understands incorporating nutritional management into lifestyle. Comprehends key points    Patient undertands incorporating physical activity into lifestyle. Comprehends key points    Patient understands using medications safely. Comphrehends key points    Patient understands monitoring blood glucose, interpreting and using results Comprehends key points    Patient understands prevention, detection, and treatment of acute complications. Comprehends key points    Patient understands prevention, detection, and treatment of chronic complications. Comprehends key points    Patient understands how to develop strategies to address psychosocial issues. Comprehends key points    Patient understands how to develop strategies to promote health/change behavior. Comprehends key points      Outcomes   Expected Outcomes Demonstrated interest in  learning. Expect positive outcomes    Future DMSE PRN    Program Status Not Completed             Individualized Plan for Diabetes Self-Management Training:   Learning Objective:  Patient will have a greater understanding of diabetes self-management. Patient education plan is to attend individual and/or group sessions per assessed needs and concerns.   Plan:   Patient Instructions  Contact your PCP and request an updated A1c. Your goal is below 7%.  Fasting blood sugars should remain under 130 mg/dL!  Work towards eating three meals a day, about 5-6 hours apart!  Begin to recognize carbohydrates, proteins, and non-starchy vegetables in your food choices!  Begin to build your meals using the proportions of the Balanced Plate. First, select your carb choice(s) for the meal. Make this 25% of your meal. Next, select your source of protein to pair with your carb choice(s). Make this another 25% of your meal. Choose  lean/low-fat protein options! Finally, complete your meal with a variety of non-starchy vegetables. Make this the remaining 50% of your meal.  Incorporate physical activity as often as tolerated while minimizing hip pain.   Expected Outcomes:  Demonstrated interest in learning. Expect positive outcomes  Education material provided: My Plate  If problems or questions, patient to contact team via:  Phone and Email  Future DSME appointment: PRN

## 2023-01-21 NOTE — Patient Instructions (Addendum)
Contact your PCP and request an updated A1c. Your goal is below 7%.  Fasting blood sugars should remain under 130 mg/dL!  Work towards eating three meals a day, about 5-6 hours apart!  Begin to recognize carbohydrates, proteins, and non-starchy vegetables in your food choices!  Begin to build your meals using the proportions of the Balanced Plate. First, select your carb choice(s) for the meal. Make this 25% of your meal. Next, select your source of protein to pair with your carb choice(s). Make this another 25% of your meal. Choose lean/low-fat protein options! Finally, complete your meal with a variety of non-starchy vegetables. Make this the remaining 50% of your meal.  Incorporate physical activity as often as tolerated while minimizing hip pain.

## 2023-01-27 ENCOUNTER — Encounter: Payer: Medicare Other | Attending: Physical Medicine & Rehabilitation | Admitting: Physical Medicine & Rehabilitation

## 2023-01-27 ENCOUNTER — Encounter: Payer: Self-pay | Admitting: Physical Medicine & Rehabilitation

## 2023-01-27 VITALS — BP 147/86 | HR 85 | Ht 68.0 in | Wt 191.4 lb

## 2023-01-27 DIAGNOSIS — M25511 Pain in right shoulder: Secondary | ICD-10-CM | POA: Insufficient documentation

## 2023-01-27 DIAGNOSIS — G8929 Other chronic pain: Secondary | ICD-10-CM | POA: Diagnosis not present

## 2023-01-27 MED ORDER — BETAMETHASONE SOD PHOS & ACET 6 (3-3) MG/ML IJ SUSP
12.0000 mg | Freq: Once | INTRAMUSCULAR | Status: AC
  Administered 2023-01-27: 12 mg via INTRAMUSCULAR

## 2023-01-27 MED ORDER — LIDOCAINE HCL 1 % IJ SOLN
5.0000 mL | Freq: Once | INTRAMUSCULAR | Status: AC
  Administered 2023-01-27: 5 mL

## 2023-01-27 NOTE — Progress Notes (Signed)
Right suprascapular nerve block under US guidance  Indication chronic shoulder pain that is not responsive to medication management and other conservative care  Pain is severe and interferes with activities and sleep and is unresponsive to subacromial injections, PT.   Informed consent was obtained after describing risks and benefits of the procedure including bleeding bruising and infection. She elects to proceed and has given written consent. Patient placed in a seated position medial to lateral approach utilized. Linear transducer placed in oblique coronal plane. Suprascapular notch identified. Needle inserted in plane medial to lateral. Once target was reached with needle tip, a solution containing one ML 6 ML per cc betamethasone and 4 mL 1% lidocaine were injected. Patient tolerated procedure well. Post procedure instructions given

## 2023-03-25 ENCOUNTER — Other Ambulatory Visit: Payer: Self-pay

## 2023-03-25 DIAGNOSIS — R972 Elevated prostate specific antigen [PSA]: Secondary | ICD-10-CM

## 2023-03-26 ENCOUNTER — Other Ambulatory Visit: Payer: Medicare Other

## 2023-03-26 DIAGNOSIS — R972 Elevated prostate specific antigen [PSA]: Secondary | ICD-10-CM | POA: Diagnosis not present

## 2023-03-27 LAB — PSA: Prostate Specific Ag, Serum: 1.1 ng/mL (ref 0.0–4.0)

## 2023-04-01 ENCOUNTER — Ambulatory Visit: Payer: Medicare Other | Admitting: Urology

## 2023-04-01 ENCOUNTER — Encounter: Payer: Self-pay | Admitting: Urology

## 2023-04-01 VITALS — BP 122/71 | HR 87

## 2023-04-01 DIAGNOSIS — Z8546 Personal history of malignant neoplasm of prostate: Secondary | ICD-10-CM

## 2023-04-01 DIAGNOSIS — N135 Crossing vessel and stricture of ureter without hydronephrosis: Secondary | ICD-10-CM | POA: Diagnosis not present

## 2023-04-01 DIAGNOSIS — C61 Malignant neoplasm of prostate: Secondary | ICD-10-CM

## 2023-04-01 DIAGNOSIS — N528 Other male erectile dysfunction: Secondary | ICD-10-CM | POA: Diagnosis not present

## 2023-04-01 DIAGNOSIS — R972 Elevated prostate specific antigen [PSA]: Secondary | ICD-10-CM

## 2023-04-01 LAB — BLADDER SCAN AMB NON-IMAGING: Scan Result: 12

## 2023-04-01 NOTE — Progress Notes (Signed)
Marcelle Overlie Plume,acting as a scribe for Vanna Scotland, MD.,have documented all relevant documentation on the behalf of Vanna Scotland, MD,as directed by  Vanna Scotland, MD while in the presence of Vanna Scotland, MD.  04/01/2023 9:06 AM   Morton Stall Morene Rankins 04-Jul-1950 [id number]  Referring provider: Glori Luis, MD [address] [place],  [place] [address]  Chief Complaint  Patient presents with   Follow-up    HPI: 72 year-old male who presents today for routine annual follow up. He was last seen a year ago. He has a personal history of prostate cancer, right UPJ obstruction and kidney stones.   He underwent a prostate biopsy on 07/18/2021. TRUS 53.6 gm. Surgical pathology was consistent with Gleason 4+3 involving 5 cores affecting up to 100%. He is currently on ADT x6 months. He is S/p Gold seed implant in 08/2021.     He has a known low-grade right UPJ obstruction.  This is managed conservatively.  Please see previous notes for details.  He also has bilateral nonobstructing stones up to 4 mm which are asymptomatic.  His most recent PSA on 03/26/2023 was 1.1.   Today, he is satisfied with urinary symptoms, experiencing a slow stream for a long time, and is not currently taking Flomax. He reports inability to achieve an erection following prostate cancer treatment. He is interested in trying Viagra or similar medications but is currently on Imdur which contraindicates the use of such medications. He is considering other options like injections or a vacuum erection device.   Results for orders placed or performed in visit on 04/01/23  Bladder Scan (Post Void Residual) in office  Result Value Ref Range   Scan Result 12 ml     IPSS     Row Name 04/01/23 0800         International Prostate Symptom Score   How often have you had the sensation of not emptying your bladder? Not at All     How often have you had to urinate less than every two hours? Not at All      How often have you found you stopped and started again several times when you urinated? Not at All     How often have you found it difficult to postpone urination? Not at All     How often have you had a weak urinary stream? About half the time     How often have you had to strain to start urination? Not at All     How many times did you typically get up at night to urinate? None     Total IPSS Score 3       Quality of Life due to urinary symptoms   If you were to spend the rest of your life with your urinary condition just the way it is now how would you feel about that? Mostly Satisfied              Score:  1-7 Mild 8-19 Moderate 20-35 Severe    PMH: Past Medical History:  Diagnosis Date   Actinic keratoses    Angina    Asthma    BCC (basal cell carcinoma of skin) 09/06/2020   left upper back lateral (EDC) ,  left upper arm anterior (EDC), left upper back medial (EDC)    Bursitis of left elbow 2018   Diabetes mellitus without complication [facility])    ED (erectile dysfunction)    HTN (hypertension) 04/12/2011   Hyperlipemia  04/12/2011   Ischemic cardiomyopathy    a. echo 2010: EF 45-50%, mild to mod ant and apical wall HK, trace MR; b. cardiac cath 06/2012: EF 40%, mild MR    Multiple vessel coronary artery disease 04/12/2011   a. remote MI 1996; b. MI 1997 s/p PCI - LAD & diag; c. MI 2009: long stenting P-MLAD & Diag & POBA of jailed diag branch; d. cath 2010: 80% ISR of LAD w/ L-R collats, med Rx; e. cath 2012: 50-60% tub mLAD, 60-70% dLAD, FFR 0.75 -->s/p PCI dissection => w/ 2 stents (3 total); f. cath 06/2012: no changes from 2012 cath, med Rx, patent stents -> MV CAD 11/20/20 --> CABG X 5   Myocardial infarction (HCC)    x 5   Obesity    OSA (obstructive sleep apnea)    on CPAP   Osteoarthritis    Renal disorder    kidney stone    Surgical History: Past Surgical History:  Procedure Laterality Date   CORONARY ANGIOPLASTY     x 5   CORONARY ARTERY BYPASS GRAFT  N/A 11/20/2020   Procedure: CORONARY ARTERY BYPASS GRAFTING (CABG) x  FIVE (LIMA-dLAD, SVG-PDA, SVG-D1, SeqSVG-OM1-2) ON PUMP  USING LEFT INTERNAL MAMMARY ARTERTY AND LEFT ENDOSCOPIC GREATER SAPHENOUS VEIN CONDUITS, RIGHT LEG OPENED NOT HARVESTED;  Surgeon: Linden Dolin, MD;  Location: [facility] OR;  Service: Open Heart Surgery;  Laterality: N/A;   CORONARY PRESSURE/FFR STUDY N/A 11/15/2020   Procedure: INTRAVASCULAR PRESSURE WIRE/FFR STUDY;  Surgeon: Yvonne Kendall, MD;  Location: [facility] INVASIVE CV LAB;  Service: Cardiovascular;  Laterality: N/A;   CORONARY STENT PLACEMENT     x 7   LEFT HEART CATH AND CORONARY ANGIOGRAPHY N/A 11/15/2020   Procedure: LEFT HEART CATH AND CORONARY ANGIOGRAPHY;  Surgeon: Yvonne Kendall, MD;  Location: [facility] INVASIVE CV LAB;  Service: Cardiovascular;  Laterality: N/A;   LEFT HEART CATHETERIZATION WITH CORONARY ANGIOGRAM N/A 04/14/2011   Procedure: LEFT HEART CATHETERIZATION WITH CORONARY ANGIOGRAM;  Surgeon: Chrystie Nose, MD;  Location: [facility] CATH LAB;  Service: Cardiovascular;  Laterality: N/A;   LEFT HEART CATHETERIZATION WITH CORONARY ANGIOGRAM N/A 06/25/2012   Procedure: LEFT HEART CATHETERIZATION WITH CORONARY ANGIOGRAM;  Surgeon: Peter M Swaziland, MD;  Location: [facility] CATH LAB;  Service: Cardiovascular;  Laterality: N/A;   TEE WITHOUT CARDIOVERSION N/A 11/20/2020   Procedure: TRANSESOPHAGEAL ECHOCARDIOGRAM (TEE);  Surgeon: Linden Dolin, MD;  Location: [facility] OR;  Service: Open Heart Surgery;  Laterality: N/A;   TONSILLECTOMY AND ADENOIDECTOMY  1955    Home Medications:  Allergies as of 04/01/2023       Reactions   Hydroxyzine Hcl Other (See Comments)   Weakness, out of this world feeling. sluggishness   Latex Rash, Other (See Comments)   I.e. Elastic= rash to blisters if worn for an extended time Patient show effects after long exposure.        Medication List        Accurate as of April 01, 2023  9:06 AM. If you have any questions, ask your nurse  or doctor.          aspirin EC 325 MG tablet Take 1 tablet (325 mg total) by mouth daily.   atorvastatin 80 MG tablet Commonly known as: LIPITOR Take 1 tablet (80 mg total) by mouth daily.   blood glucose meter kit and supplies Kit Dispense based on patient and insurance preference. Use up to four times daily as directed.   carvedilol 6.25 MG tablet Commonly known as:  COREG Take 1 tablet (6.25 mg total) by mouth 2 (two) times daily with a meal.   ezetimibe 10 MG tablet Commonly known as: ZETIA Take 1 tablet (10 mg total) by mouth daily.   furosemide 40 MG tablet Commonly known as: LASIX Take 1 tablet (40 mg total) by mouth daily.   insulin glargine 100 UNIT/ML Solostar Pen Commonly known as: LANTUS Inject 30 Units into the skin daily.   Insulin Pen Needle 31G X 5 MM Misc Inject insulin via insulin pen daily   isosorbide mononitrate 30 MG 24 hr tablet Commonly known as: IMDUR Take 1 tablet (30 mg total) by mouth daily.   ketoconazole 2 % cream Commonly known as: NIZORAL Apply twice daily to pink, scaly areas on face, chest, arms until improved.   losartan 25 MG tablet Commonly known as: COZAAR Take 1 tablet (25 mg total) by mouth daily.   Magnesium 250 MG Tabs Take 1 tablet by mouth daily.   metFORMIN 1000 MG tablet Commonly known as: GLUCOPHAGE TAKE 1 TABLET TWICE A DAY WITH MEALS   multivitamins ther. w/minerals Tabs tablet Take 1 tablet by mouth every morning.   niacin 1000 MG CR tablet Commonly known as: NIASPAN Take 2 tablets (2000 mg) by mouth once daily at bedtime   NON FORMULARY Take 1-2 tablets by mouth daily as needed (Pain). CBD Gummy   potassium chloride SA 20 MEQ tablet Commonly known as: KLOR-CON M Take 1 tablet (20 mEq total) by mouth daily.   Semaglutide (1 MG/DOSE) 4 MG/3ML Sopn Inject 1 mg as directed once a week.        Allergies:  Allergies  Allergen Reactions   Hydroxyzine Hcl Other (See Comments)    Weakness, out of  this world feeling. sluggishness   Latex Rash and Other (See Comments)    I.e. Elastic= rash to blisters if worn for an extended time  Patient show effects after long exposure.    Family History: Family History  Problem Relation Age of Onset   COPD Mother    Other Father        muscle tumor   Stroke Father    Prostate cancer Father        mets   Heart disease Father    Alcoholism Father    Stroke Sister    Heart block Sister        Heart bypass   Bladder Cancer Neg Hx    Kidney cancer Neg Hx     Social History:  reports that he quit smoking about 15 years ago. His smoking use included cigarettes. He started smoking about 55 years ago. He has a 60 pack-year smoking history. He has never used smokeless tobacco. He reports that he does not currently use drugs after having used the following drugs: Marijuana. He reports that he does not drink alcohol.   Physical Exam: BP 122/71   Pulse 87   Constitutional:  Alert and oriented, No acute distress. HEENT: [place] AT, moist mucus membranes.  Trachea midline, no masses. Neurologic: Grossly intact, no focal deficits, moving all 4 extremities. Psychiatric: Normal mood and affect.   Assessment & Plan:    1. Prostate cancer - Plan to recheck his PSA in 6 months for a lab only to establish a new baseline and monitor for recurrence. - Reports a slow urinary stream but is satisfied with current symptoms. No current use of Flomax. - No changes in management at this time.  2. Right UPJ obstruction - Kidney  function appears stable. No current issues with the known abnormal ureteral connection.  3. Erectile dysfunction - Likely secondary to prostate cancer treatment, diabetes, age, and cardiovascular disease. - PDE5 inhibitors are contraindicated due to concurrent use of Imdur - Discussed alternative treatment options: penile injections, vacuum erection device, and penile prosthesis.  - He is considering options and will follow up if  interested in injections. - Advised to discuss medication adjustment with cardiologist if considering PDE5 inhibitors.  Return in about 6 months (around 09/29/2023) for repeat PSA lab visit only. Return in 1 year for office visit.  I have reviewed the above documentation for accuracy and completeness, and I agree with the above.   Vanna Scotland, MD    [facility] [address], [address] [place], [place] [address] [phone]  I spent 37 total minutes on the day of the encounter including pre-visit review of the medical record, face-to-face time with the patient, and post visit ordering of labs/imaging/tests.

## 2023-04-02 ENCOUNTER — Other Ambulatory Visit: Payer: Self-pay | Admitting: *Deleted

## 2023-04-02 DIAGNOSIS — C61 Malignant neoplasm of prostate: Secondary | ICD-10-CM

## 2023-04-08 ENCOUNTER — Inpatient Hospital Stay: Payer: Medicare Other | Attending: Radiation Oncology

## 2023-04-13 ENCOUNTER — Inpatient Hospital Stay: Payer: Medicare Other | Attending: Radiation Oncology

## 2023-04-13 DIAGNOSIS — C61 Malignant neoplasm of prostate: Secondary | ICD-10-CM

## 2023-04-13 LAB — PSA: Prostatic Specific Antigen: 1.24 ng/mL (ref 0.00–4.00)

## 2023-04-16 ENCOUNTER — Other Ambulatory Visit: Payer: Self-pay | Admitting: *Deleted

## 2023-04-16 ENCOUNTER — Ambulatory Visit
Admission: RE | Admit: 2023-04-16 | Discharge: 2023-04-16 | Disposition: A | Discharge status: Home / Self Care | Payer: Medicare Other | Source: Ambulatory Visit | Attending: Radiation Oncology | Admitting: Radiation Oncology

## 2023-04-16 ENCOUNTER — Encounter: Payer: Self-pay | Admitting: Radiation Oncology

## 2023-04-16 VITALS — BP 134/87 | HR 80 | Temp 98.2°F

## 2023-04-16 DIAGNOSIS — Z923 Personal history of irradiation: Secondary | ICD-10-CM | POA: Insufficient documentation

## 2023-04-16 DIAGNOSIS — C61 Malignant neoplasm of prostate: Secondary | ICD-10-CM | POA: Insufficient documentation

## 2023-04-16 DIAGNOSIS — Z191 Hormone sensitive malignancy status: Secondary | ICD-10-CM | POA: Diagnosis not present

## 2023-04-16 NOTE — Progress Notes (Signed)
Radiation Oncology Follow up Note  Name: [name]   Date:   04/16/2023 MRN:  [id number] DOB: 10-28-1950    This 72 y.o. male presents to the clinic today for 25-month follow-up status post image guided IMRT radiation therapy for Gleason 7 (4+3) adenocarcinoma prostate presenting with a PSA of 7.4..  REFERRING PROVIDER: Glori Luis, MD  HPI: Patient is a 72 year old male now out 16 months having completed IMRT radiation therapy to his prostate for Gleason 7 adenocarcinoma the prostate seen today in routine follow-up he is doing well specifically denies any increased lower urinary tract symptoms diarrhea or fatigue.  His most recent PSA this month was 1.25.  Slight trending upwards from 0.36 months ago.  Patient does have VED is being addressed by urology.  COMPLICATIONS OF TREATMENT: none  FOLLOW UP COMPLIANCE: keeps appointments   PHYSICAL EXAM:  BP 134/87   Pulse 80   Temp 98.2 F (36.8 C) (Tympanic)  Well-developed well-nourished patient in NAD. HEENT reveals PERLA, EOMI, discs not visualized.  Oral cavity is clear. No oral mucosal lesions are identified. Neck is clear without evidence of cervical or supraclavicular adenopathy. Lungs are clear to A&P. Cardiac examination is essentially unremarkable with regular rate and rhythm without murmur rub or thrill. Abdomen is benign with no organomegaly or masses noted. Motor sensory and DTR levels are equal and symmetric in the upper and lower extremities. Cranial nerves II through XII are grossly intact. Proprioception is intact. No peripheral adenopathy or edema is identified. No motor or sensory levels are noted. Crude visual fields are within normal range.  RADIOLOGY RESULTS: No current films to review  PLAN: Present time patient is clinically doing well very low side effect profile.  Slight pick up in his PSA which we will monitor with repeat PSA in 6 months.  He is being followed closely by urology.  Patient knows to call  with any concerns at any time.  I would like to take this opportunity to thank you for allowing me to participate in the care of your patient.Carmina Miller, MD

## 2023-04-29 ENCOUNTER — Other Ambulatory Visit: Payer: Self-pay | Admitting: Family Medicine

## 2023-04-29 ENCOUNTER — Other Ambulatory Visit: Payer: Self-pay | Admitting: Cardiovascular Disease

## 2023-04-29 NOTE — Telephone Encounter (Signed)
Pt scheduled on 12/23

## 2023-04-29 NOTE — Telephone Encounter (Signed)
Please contact pt for future appointment. Pt Overdue for 12 month f/u.
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
## 2023-05-04 ENCOUNTER — Encounter: Payer: Self-pay | Admitting: Cardiovascular Disease

## 2023-05-04 ENCOUNTER — Ambulatory Visit: Payer: Medicare Other | Attending: Cardiovascular Disease | Admitting: Cardiovascular Disease

## 2023-05-04 VITALS — BP 110/62 | HR 85 | Resp 98 | Ht 68.0 in | Wt 185.1 lb

## 2023-05-04 DIAGNOSIS — Z951 Presence of aortocoronary bypass graft: Secondary | ICD-10-CM | POA: Diagnosis not present

## 2023-05-04 DIAGNOSIS — I25118 Atherosclerotic heart disease of native coronary artery with other forms of angina pectoris: Secondary | ICD-10-CM | POA: Diagnosis not present

## 2023-05-04 DIAGNOSIS — I10 Essential (primary) hypertension: Secondary | ICD-10-CM

## 2023-05-04 DIAGNOSIS — E7800 Pure hypercholesterolemia, unspecified: Secondary | ICD-10-CM

## 2023-05-04 DIAGNOSIS — G4733 Obstructive sleep apnea (adult) (pediatric): Secondary | ICD-10-CM | POA: Diagnosis not present

## 2023-05-04 DIAGNOSIS — I255 Ischemic cardiomyopathy: Secondary | ICD-10-CM | POA: Diagnosis not present

## 2023-05-04 DIAGNOSIS — E785 Hyperlipidemia, unspecified: Secondary | ICD-10-CM | POA: Diagnosis not present

## 2023-05-04 DIAGNOSIS — I493 Ventricular premature depolarization: Secondary | ICD-10-CM

## 2023-05-04 MED ORDER — POTASSIUM CHLORIDE CRYS ER 20 MEQ PO TBCR: 20.0000 meq | EXTENDED_RELEASE_TABLET | Freq: Every day | ORAL | 3 refills | Status: DC

## 2023-05-04 MED ORDER — CARVEDILOL 6.25 MG PO TABS: 6.2500 mg | ORAL_TABLET | Freq: Two times a day (BID) | ORAL | 3 refills | Status: DC

## 2023-05-04 MED ORDER — EZETIMIBE 10 MG PO TABS: 10.0000 mg | ORAL_TABLET | Freq: Every day | ORAL | 3 refills | Status: DC

## 2023-05-04 MED ORDER — FUROSEMIDE 40 MG PO TABS: 40.0000 mg | ORAL_TABLET | Freq: Every day | ORAL | 3 refills | Status: DC

## 2023-05-04 MED ORDER — ATORVASTATIN CALCIUM 80 MG PO TABS: 80.0000 mg | ORAL_TABLET | Freq: Every day | ORAL | 3 refills | Status: DC

## 2023-05-04 MED ORDER — NITROGLYCERIN 0.4 MG/SPRAY TL SOLN: 1.0000 | 6 refills | Status: AC | PRN

## 2023-05-04 MED ORDER — ISOSORBIDE MONONITRATE ER 30 MG PO TB24: 30.0000 mg | ORAL_TABLET | Freq: Every day | ORAL | 3 refills | Status: DC

## 2023-05-04 MED ORDER — LOSARTAN POTASSIUM 25 MG PO TABS: 25.0000 mg | ORAL_TABLET | Freq: Every day | ORAL | 3 refills | Status: DC

## 2023-05-04 NOTE — Patient Instructions (Signed)

## 2023-06-23 DIAGNOSIS — Z23 Encounter for immunization: Secondary | ICD-10-CM | POA: Diagnosis not present

## 2023-07-02 ENCOUNTER — Ambulatory Visit: Payer: Medicare Other | Admitting: Dermatology

## 2023-07-06 ENCOUNTER — Encounter: Payer: Self-pay | Admitting: Cardiovascular Disease

## 2023-07-27 ENCOUNTER — Telehealth: Payer: Self-pay | Admitting: Family Medicine

## 2023-07-27 NOTE — Telephone Encounter (Signed)
 Dr Birdie Sons is no longer at this location. Please call the office to schedule a Transfer of Care to either Dr Charlann Lange, Darleen Crocker or Kara Dies, NP. E2C2 please schedule.  Thank you

## 2023-08-18 ENCOUNTER — Emergency Department

## 2023-08-18 ENCOUNTER — Other Ambulatory Visit: Payer: Self-pay

## 2023-08-18 ENCOUNTER — Emergency Department: Admission: EM | Admit: 2023-08-18 | Discharge: 2023-08-18 | Disposition: A | Discharge status: Home / Self Care

## 2023-08-18 DIAGNOSIS — K1120 Sialoadenitis, unspecified: Secondary | ICD-10-CM | POA: Diagnosis not present

## 2023-08-18 DIAGNOSIS — K047 Periapical abscess without sinus: Secondary | ICD-10-CM | POA: Insufficient documentation

## 2023-08-18 DIAGNOSIS — I10 Essential (primary) hypertension: Secondary | ICD-10-CM | POA: Diagnosis not present

## 2023-08-18 DIAGNOSIS — K0889 Other specified disorders of teeth and supporting structures: Secondary | ICD-10-CM | POA: Diagnosis not present

## 2023-08-18 DIAGNOSIS — R220 Localized swelling, mass and lump, head: Secondary | ICD-10-CM

## 2023-08-18 DIAGNOSIS — K029 Dental caries, unspecified: Secondary | ICD-10-CM | POA: Diagnosis not present

## 2023-08-18 DIAGNOSIS — I2510 Atherosclerotic heart disease of native coronary artery without angina pectoris: Secondary | ICD-10-CM | POA: Insufficient documentation

## 2023-08-18 DIAGNOSIS — E119 Type 2 diabetes mellitus without complications: Secondary | ICD-10-CM | POA: Insufficient documentation

## 2023-08-18 DIAGNOSIS — R609 Edema, unspecified: Secondary | ICD-10-CM | POA: Diagnosis not present

## 2023-08-18 DIAGNOSIS — Z951 Presence of aortocoronary bypass graft: Secondary | ICD-10-CM | POA: Diagnosis not present

## 2023-08-18 LAB — BASIC METABOLIC PANEL WITH GFR
Anion gap: 9 (ref 5–15)
BUN: 20 mg/dL (ref 8–23)
CO2: 23 mmol/L (ref 22–32)
Calcium: 9.4 mg/dL (ref 8.9–10.3)
Chloride: 103 mmol/L (ref 98–111)
Creatinine, Ser: 1.07 mg/dL (ref 0.61–1.24)
GFR, Estimated: >60 mL/min (ref >60)
Glucose, Bld: 274 mg/dL — ABNORMAL HIGH (ref 70–99)
Potassium: 4 mmol/L (ref 3.5–5.1)
Sodium: 135 mmol/L (ref 135–145)

## 2023-08-18 LAB — CBC WITH DIFFERENTIAL/PLATELET
Abs Immature Granulocytes: 0.05 10*3/uL (ref 0.00–0.07)
Basophils Absolute: 0.1 10*3/uL (ref 0.0–0.1)
Basophils Relative: 1 %
Eosinophils Absolute: 0.1 10*3/uL (ref 0.0–0.5)
Eosinophils Relative: 1 %
HCT: 41.6 % (ref 39.0–52.0)
Hemoglobin: 14.4 g/dL (ref 13.0–17.0)
Immature Granulocytes: 1 %
Lymphocytes Relative: 12 %
Lymphs Abs: 1.2 10*3/uL (ref 0.7–4.0)
MCH: 31 pg (ref 26.0–34.0)
MCHC: 34.6 g/dL (ref 30.0–36.0)
MCV: 89.7 fL (ref 80.0–100.0)
Monocytes Absolute: 0.8 10*3/uL (ref 0.1–1.0)
Monocytes Relative: 8 %
Neutro Abs: 7.4 10*3/uL (ref 1.7–7.7)
Neutrophils Relative %: 77 %
Platelets: 183 10*3/uL (ref 150–400)
RBC: 4.64 MIL/uL (ref 4.22–5.81)
RDW: 12.8 % (ref 11.5–15.5)
WBC: 9.6 10*3/uL (ref 4.0–10.5)
nRBC: 0 % (ref 0.0–0.2)

## 2023-08-18 LAB — LACTIC ACID, PLASMA: Lactic Acid, Venous: 1.1 mmol/L (ref 0.5–1.9)

## 2023-08-18 MED ORDER — IOHEXOL 300 MG/ML  SOLN
75.0000 mL | Freq: Once | INTRAMUSCULAR | Status: AC | PRN
Start: 2023-08-18 — End: 2023-08-18
  Administered 2023-08-18: 75 mL via INTRAVENOUS

## 2023-08-18 MED ORDER — KETOROLAC TROMETHAMINE 15 MG/ML IJ SOLN
15.0000 mg | Freq: Once | INTRAMUSCULAR | Status: DC
Start: 2023-08-18 — End: 2023-08-18

## 2023-08-18 MED ORDER — ACETAMINOPHEN 500 MG PO TABS: 1000.0000 mg | ORAL_TABLET | Freq: Four times a day (QID) | ORAL | 2 refills | Status: AC | PRN

## 2023-08-18 MED ORDER — ACETAMINOPHEN 500 MG PO TABS
1000.0000 mg | ORAL_TABLET | Freq: Once | ORAL | Status: AC
  Administered 2023-08-18: 1000 mg via ORAL
  Filled 2023-08-18: qty 2

## 2023-08-18 MED ORDER — KETOROLAC TROMETHAMINE 15 MG/ML IJ SOLN
15.0000 mg | Freq: Once | INTRAMUSCULAR | Status: AC
  Administered 2023-08-18: 15 mg via INTRAVENOUS
  Filled 2023-08-18: qty 1

## 2023-08-18 MED ORDER — AMOXICILLIN-POT CLAVULANATE 875-125 MG PO TABS: 1.0000 | ORAL_TABLET | Freq: Two times a day (BID) | ORAL | 0 refills | Status: AC

## 2023-08-18 NOTE — ED Triage Notes (Signed)
 Pt reports right side jaw swelling with dental pain. Pt has known broken tooth on same side. Pt states he has also had some decreased hearing bilaterally.

## 2023-08-18 NOTE — Discharge Instructions (Addendum)
 Your evaluation in the emergency department was notable for an inflamed salivary gland as well as apparent tooth infection.  I have started you on antibiotic to treat this, I recommend using sour candies to help open up your salivary duct.  Please do follow-up with your primary care doctor and dentist for reevaluation.  You can use Tylenol as needed for any ongoing discomfort.  Return to the emergency department with any new or worsening symptoms.

## 2023-08-18 NOTE — ED Provider Notes (Signed)
 [facility] Provider Note    Event Date/Time   First MD Initiated Contact with Patient 08/18/23 [phone]     (approximate)   History   Facial Swelling Pt reports right side jaw swelling with dental pain. Pt has known broken tooth on same side. Pt states he has also had some decreased hearing bilaterally.    HPI  [name] is a 73 y.o. male PMH CAD status post CABG, hypertension, hyperlipidemia, T2DM, ischemic cardiomyopathy presents for evaluation of facial swelling, dental pain -Pain present for the past few days, localized to right lower posterior molar right jaw.  Tolerating p.o.'s.  No difficulty breathing.  No voice change with no fevers. -No preceding trauma     Physical Exam   Triage Vital Signs: ED Triage Vitals  Encounter Vitals Group     BP 08/18/23 0539 (!) 161/95     Systolic BP Percentile --      Diastolic BP Percentile --      Pulse Rate 08/18/23 0539 100     Resp 08/18/23 0539 16     Temp 08/18/23 0538 98.6 F (37 C)     Temp Source 08/18/23 0538 Oral     SpO2 08/18/23 0539 96 %     Weight 08/18/23 0538 184 lb (83.5 kg)     Height 08/18/23 0538 5\' 8"  (1.727 m)     Head Circumference --      Peak Flow --      Pain Score --      Pain Loc --      Pain Education --      Exclude from Growth Chart --     Most recent vital signs: Vitals:   08/18/23 0538 08/18/23 0539  BP:  (!) 161/95  Pulse:  100  Resp:  16  Temp: 98.6 F (37 C)   SpO2:  96%     General: Awake, no distress.  CV:  Good peripheral perfusion.  Resp:  Normal effort, no respiratory distress Abd:  No distention.  HEENT: Trace swelling to right jaw, no overlying skin changes appreciated.  Able to range jaw but with pain.  No pooling of secretions, no obvious voice change.  Does have area of fluctuance with small expression of pus at the base of right lower posterior molar. + Right submandibular lymphadenopathy versus gland enlargement present.   ED  Results / Procedures / Treatments   Labs (all labs ordered are listed, but only abnormal results are displayed) Labs Reviewed  BASIC METABOLIC PANEL WITH GFR - Abnormal; Notable for the following components:      Result Value   Glucose, Bld 274 (*)    All other components within normal limits  CBC WITH DIFFERENTIAL/PLATELET  LACTIC ACID, PLASMA     EKG N/a  RADIOLOGY See ED course below  PROCEDURES:  Critical Care performed: No  Procedures   MEDICATIONS ORDERED IN ED: Medications  iohexol (OMNIPAQUE) 300 MG/ML solution 75 mL (75 mLs Intravenous Contrast Given 08/18/23 0722)  acetaminophen (TYLENOL) tablet 1,000 mg (1,000 mg Oral Given 08/18/23 0740)  ketorolac (TORADOL) 15 MG/ML injection 15 mg (15 mg Intravenous Given 08/18/23 0740)     IMPRESSION / MDM / ASSESSMENT AND PLAN / ED COURSE  I reviewed the triage vital signs and the nursing notes.                              Differential diagnosis  includes, but is not limited to, periapical abscess, consider deeper extension of abscess into mandibular region, parotitis.  Exam overall less suggestive of deep neck space infection at this time.  No systemic symptoms to suggest disseminated infection.  Plan -Labs  -CT max face -Pain control Reevaluate  Patient's presentation is most consistent with acute complicated illness / injury requiring diagnostic workup. The patient is on the cardiac monitor to evaluate for evidence of arrhythmia and/or significant heart rate changes.   ED course below.  Findings consistent with sialoadenitis, fortunately no evidence of underlying abscess.  Does have dental infection, will start on Augmentin.  Counseled on use of sour candies to help open salivary tract.  Plan for PMD and dental follow-up.  ED return precautions in place. Clinical Course as of 08/18/23 0810  Tue Aug 18, 2023  0716 CBC, BMP reviewed, unremarkable.  Lactate normal. [MM]  0749 CT face interpreted by myself, no obvious  abscess appreciated  Formal radiology read below: IMPRESSION: 1. Right submandibular sialadenitis with right submandibular and sublingual edema causing mild mass effect on the tongue. No underlying stone or abscess. 2. Right parapharyngeal lipoma measuring 3 cm   [MM]  0805 Patient reevaluated, findings discussed.  Given obvious minor infection around base of right posterior molar, will start on Augmentin (has tolerated this well in the past) as patient is developing early infection related to tooth, fortunately no underlying abscess.  Also discussed using sour candies to help with sialoadenitis.  Recommend PMD and dental follow-up.  Strict ED return precautions discussed extensively.  Patient agrees with plan.  Tylenol prn For pain.  Will proceed with discharge home. [MM]    Clinical Course User Index [MM] Marinell Blight, MD     FINAL CLINICAL IMPRESSION(S) / ED DIAGNOSES   Final diagnoses:  Sialadenitis  Dental infection  Right facial swelling     Rx / DC Orders   ED Discharge Orders          Ordered    amoxicillin-clavulanate (AUGMENTIN) 875-125 MG tablet  2 times daily        08/18/23 0808    acetaminophen (TYLENOL) 500 MG tablet  Every 6 hours PRN        08/18/23 0808             Note:  This document was prepared using Dragon voice recognition software and may include unintentional dictation errors.   Marinell Blight, MD 08/18/23 [phone]

## 2023-08-18 NOTE — ED Notes (Signed)
 Pt to CT

## 2023-08-19 ENCOUNTER — Other Ambulatory Visit: Payer: Self-pay | Admitting: Cardiovascular Disease

## 2023-08-20 NOTE — Telephone Encounter (Signed)
 Hi, Could you please advise if ok to refill Niacin or defer to PCP? Thank you so much.

## 2023-09-25 ENCOUNTER — Other Ambulatory Visit: Payer: Self-pay

## 2023-10-07 ENCOUNTER — Encounter: Payer: Self-pay | Admitting: Cardiovascular Disease

## 2023-10-07 ENCOUNTER — Other Ambulatory Visit: Payer: Self-pay

## 2023-10-07 ENCOUNTER — Encounter: Payer: Self-pay | Admitting: Urology

## 2023-10-07 DIAGNOSIS — C61 Malignant neoplasm of prostate: Secondary | ICD-10-CM

## 2023-10-07 DIAGNOSIS — R361 Hematospermia: Secondary | ICD-10-CM

## 2023-10-07 NOTE — Telephone Encounter (Signed)
 done

## 2023-10-08 ENCOUNTER — Other Ambulatory Visit

## 2023-10-08 DIAGNOSIS — C61 Malignant neoplasm of prostate: Secondary | ICD-10-CM

## 2023-10-08 DIAGNOSIS — R361 Hematospermia: Secondary | ICD-10-CM | POA: Diagnosis not present

## 2023-10-08 DIAGNOSIS — R972 Elevated prostate specific antigen [PSA]: Secondary | ICD-10-CM | POA: Diagnosis not present

## 2023-10-08 LAB — MICROSCOPIC EXAMINATION
Bacteria, UA: NONE SEEN
RBC, Urine: NONE SEEN /HPF (ref 0–2)

## 2023-10-08 LAB — URINALYSIS, COMPLETE
Bilirubin, UA: NEGATIVE
Glucose, UA: NEGATIVE
Ketones, UA: NEGATIVE
Leukocytes,UA: NEGATIVE
Nitrite, UA: NEGATIVE
Protein,UA: NEGATIVE
RBC, UA: NEGATIVE
Specific Gravity, UA: 1.015 (ref 1.005–1.030)
Urobilinogen, Ur: 0.2 mg/dL (ref 0.2–1.0)
pH, UA: 6 (ref 5.0–7.5)

## 2023-10-09 ENCOUNTER — Encounter: Payer: Self-pay | Admitting: Student

## 2023-10-09 ENCOUNTER — Ambulatory Visit

## 2023-10-09 ENCOUNTER — Ambulatory Visit: Attending: Student | Admitting: Student

## 2023-10-09 VITALS — BP 122/60 | HR 85 | Ht 68.0 in | Wt 192.0 lb

## 2023-10-09 DIAGNOSIS — R002 Palpitations: Secondary | ICD-10-CM | POA: Insufficient documentation

## 2023-10-09 DIAGNOSIS — I25810 Atherosclerosis of coronary artery bypass graft(s) without angina pectoris: Secondary | ICD-10-CM | POA: Diagnosis not present

## 2023-10-09 DIAGNOSIS — I739 Peripheral vascular disease, unspecified: Secondary | ICD-10-CM | POA: Insufficient documentation

## 2023-10-09 DIAGNOSIS — I255 Ischemic cardiomyopathy: Secondary | ICD-10-CM | POA: Insufficient documentation

## 2023-10-09 DIAGNOSIS — I10 Essential (primary) hypertension: Secondary | ICD-10-CM | POA: Diagnosis not present

## 2023-10-09 LAB — PSA: Prostate Specific Ag, Serum: 2.2 ng/mL (ref 0.0–4.0)

## 2023-10-09 NOTE — Progress Notes (Signed)
 Cardiology Clinic Note   Date: 10/09/2023 ID: Tiras, Bianchini 06-19-1950, MRN [id number]  Primary Cardiologist:  Belva Boyden, MD  Chief Complaint   [name] is a 73 y.o. male who presents to the clinic today for evaluation of palpitations.   Patient Profile   [name] is followed by Dr. Gollan for the history outlined below.      Past medical history significant for: CAD. LHC (anterior wall MI) ~1997: Angioplasty stent placement mid LAD. LHC ~2000: Stent placement mid diagonal. LHC 09/13/2007 (acute MI): Balloon angioplasty of in-stent restenosis mid LAD.  PCI with stent 3.5 x 23 to proximal LAD. LHC 07/05/2008 (abnormal stress test): Proximal RCA 50 to 60% followed by mid segment tandem 30 to 40% stenoses.  LCx with mild diffuse 40 to 50% distal stenosis.  OM1 with mild luminary irregularity.  Widely patent proximal LAD stent.  Severe in-stent restenosis mid LAD 80%.  Distal to the stent the LAD is more than normal except at the apex there is a focal 80% stenosis.  Faint collaterals noted from RCA to LAD.  Given the diffuse disease recommendation for medical therapy. LHC 04/14/2011 (unstable angina): 50 to 60% tubular mid LAD in-stent restenosis.  Distal LAD 60 to 70%.  Ostial RCA 50%.  FFR of mid LAD showed hemodynamically significant stenosis.  He underwent Cutting Balloon PCI and stent placement complicated by dissection leading to 2 further overlapping stents for repair for total of 3 stents.  Recommendation for lifelong DAPT. LHC 06/25/2012: Single-vessel obstructive CAD involving distal LAD.  Unchanged from prior study.  Patent stents in proximal to mid LAD and D1. LHC 11/15/2020 (unstable angina): Three-vessel CAD with 90% ostial/proximal LAD stenosis that appears to be within old stent though heavily calcification confounds visualization.  Long proximal third distal LCx disease up to 70% that is hemodynamically significant.  Distal RCA 50% that is  borderline significant.  Severely reduced LV systolic function with mildly elevated LVEDP.  Recommend CTS consult. 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 Ischemic cardiomyopathy. Echo 04/16/2021: EF 40 to 45%.  Hypokinesis of the anterior, mid to apical anteroseptal and apical regions.  Grade I DD.  Normal RV size/function.  Mildly elevated PA pressure, RVSP 44.9 mmHg.  Moderate LAE.  Mild to moderate MR. Hypertension. Hyperlipidemia. Lipid panel 04/02/2022: LDL 36, HDL 35, TG 96, total 89. OSA. T2DM. Prostate cancer.  In summary, patient has an extensive history of CAD.  His first MI was in 1996.  He suffered another MI in 1997 and underwent PCI with stent to LAD and diagonal branches in [facility] .  In 2009 he had a third MI with critical stenosis of the proximal LAD s/p PCI with long stenting to the mid LAD and diagonal branch.  He also had balloon angioplasty of the jailed branch.  Repeat cardiac catheterization in February 2010 secondary to an abnormal stress test showed 1 patent stent in the LAD and severe in-stent restenosis along old stent as detailed above. Echo at that time showed EF 45 to 50% with mild to moderate anterior wall and apical hypokinesis, no significant valvular abnormalities.  Medical therapy was continued.  He underwent PCI with overlapping DES in December 2012 as detailed above.  Repeat catheterization in February 2014 showed patent stents otherwise unchanged from prior catheterization.  In July 2022 he presented with unstable angina and underwent catheterization showing three-vessel CAD.  CTS was  consulted and he underwent CABG x 5.  Echo at that time demonstrated EF 30 to 35%.  Akinesis of mid to apical anteroseptal and inferior septal walls and akinesis of apical anterior, apical lateral and apical inferior walls, Grade I DD, normal RV size/function, mild LAE, trivial MR, mild aortic valve sclerosis without stenosis. Repeat echo  December 2022 improved showed EF 40-45% as detailed above.   Patient was last seen in the office by Dr. Gollan on 05/04/2023 for routine follow up. Patient was doing well at that time and no changes were made.   Patient contacted the office on 10/08/2023 to report episodes of palpitations. He was scheduled for evaluation.      History of Present Illness    Today, patient reports experiencing 2 different kinds of palpitations for the last few weeks to months. He states he will feel "like something is off" and check his pulse and he feels an occasional skipped beat. He is uncertain how long this will last but feels it is minutes. He states when the feeling that something is wrong goes away he stops checking his pulse. He also experiences a feeling of "breathlessness" causing him to check his pulse. During this sensation he feels his pulse is irregular and "thready." The second type of palpitations worries him the most. He reports these episodes occur randomly, sometimes after eating, but with no real pattern. It typically occurs for a few days and then can go away for days. He has not had any episodes in the last week. He reports in early April he had a dental infection along with a URI. He was treated with antibiotic. He is otherwise feeling well. Patient denies shortness of breath, dyspnea on exertion, orthopnea or PND. Chronic, mild lower extremity edema. No chest pain, pressure, or tightness. He does not participate in routine exercise but he does stay active doing yard work and other day to day activities in his home. His activity is limited by right shoulder pain and he is awaiting reverse shoulder replacement as soon as his sugar is under better control. He also mentions that he has been unable to walk due to cramping in his right calf. He states cramping occurs with walking and resolves with rest. He hydrates well and "always" drinks water .     ROS: All other systems reviewed and are otherwise  negative except as noted in History of Present Illness.  EKGs/Labs Reviewed    EKG Interpretation Date/Time:  Friday Oct 09 2023 14:47:59 EDT Ventricular Rate:  85 PR Interval:  140 QRS Duration:  100 QT Interval:  354 QTC Calculation: 421 R Axis:   13  Text Interpretation: Normal sinus rhythm with sinus arrhythmia Septal infarct (cited on or before 15-Nov-2020) Cannot rule out Inferior infarct (cited on or before 15-Nov-2020) When compared with ECG of 04-May-2023 13:26, No significant change was found Confirmed by Morey Ar [phone]) on 10/09/2023 3:07:39 PM   08/18/2023: BUN 20; Creatinine, Ser 1.07; Potassium 4.0; Sodium 135   08/18/2023: Hemoglobin 14.4; WBC 9.6     Physical Exam    VS:  BP 122/60 (BP Location: Left Arm, Patient Position: Sitting, Cuff Size: Normal)   Pulse 85   Ht 5\' 8"  (1.727 m)   Wt 192 lb (87.1 kg)   SpO2 96%   BMI 29.19 kg/m  , BMI Body mass index is 29.19 kg/m.  GEN: Well nourished, well developed, in no acute distress. Neck: No JVD or carotid bruits. Cardiac:  RRR. No murmurs. No  rubs or gallops.   Respiratory:  Respirations regular and unlabored. Clear to auscultation without rales, wheezing or rhonchi. GI: Soft, nontender, nondistended. Extremities: Radials 2+ and equal bilaterally DP/PT pulses 1+ on the right and 2+ on the left. No clubbing or cyanosis. Mild nonpitting lower extremity edema.  Skin: Warm and dry, no rash. Neuro: Strength intact.  Assessment & Plan   Palpitations Patient reports 2 different types of palpitations. One feels like skipping with associated feeling "like something is off." The other type of palpitations feels irregular and "thready" and are accompanied by "breathlessness." He reports episodes are random and sometimes occur after eating. He has not had any episodes in the last week. EKG today shows NSR with sinus arrhythmia.  - 14 day Zio. Patient will delay placing monitor until he has an episode of palpitations.    CAD Extensive PCI history as detailed in patient profile.  Ultimately underwent CABG x 13 November 2020.  Patient denies chest pain, pressure or tightness. He is able to perform yard work and day to day activities without limitation.  - Continue aspirin , atorvastatin , Zetia , carvedilol , isosorbide , as needed SL NTG.  Ischemic cardiomyopathy Echo December 2022 showed EF 40 to 45%, hypokinesis of the anterior, mid to apical anteroseptal and apical regions, Grade I DD, normal RV size/function, mildly elevated PA pressure, moderate LAE, mild to moderate MR.  Patient denies DOE, orthopnea or PND. He reports chronic mild lower extremity edema.  Euvolemic and well compensated on exam. - Continue carvedilol , Lasix , isosorbide , losartan .  Jardiance  previously stopped secondary to urosepsis in 2023.  Hypertension BP today 122/60. No report of headaches or dizziness.  - Continue losartan , carvedilol , isosorbide .  Claudication Patient reports right lower extremity claudication. He states cramping when he walks that resolves when he rests. He hydrates well drinking water  throughout the day. 1+ DP/PT pulses on the right, 2+ on the left.  - ABI and LEA US .   Disposition: 14 day Zio. ABI and LEA US . Return in 3 months or sooner as needed.          Signed, Lonell Rives. Lejon Afzal, DNP, NP-C

## 2023-10-09 NOTE — Patient Instructions (Signed)
 Medication Instructions:  Your Physician recommend you continue on your current medication as directed.    *If you need a refill on your cardiac medications before your next appointment, please call your pharmacy*  Lab Work: None ordered at this time  If you have labs (blood work) drawn today and your tests are completely normal, you will receive your results only by: MyChart Message (if you have MyChart) OR A paper copy in the mail If you have any lab test that is abnormal or we need to change your treatment, we will call you to review the results.  Testing/Procedures: Your physician has requested that you have an ankle brachial index (ABI). During this test an ultrasound and blood pressure cuff are used to evaluate the arteries that supply the arms and legs with blood.  Allow thirty minutes for this exam.  There are no restrictions or special instructions.  This will take place at [address] [facility] [address]) [address], [place] [address]  Please note: We ask at that you not bring children with you during ultrasound (echo/ vascular) testing. Due to room size and safety concerns, children are not allowed in the ultrasound rooms during exams. Our front office staff cannot provide observation of children in our lobby area while testing is being conducted. An adult accompanying a patient to their appointment will only be allowed in the ultrasound room at the discretion of the ultrasound technician under special circumstances. We apologize for any inconvenience.   Your physician has requested that you have a lower extremity arterial duplex. During this test, ultrasound is used to evaluate arterial blood flow in the legs. Allow one hour for this exam. There are no restrictions or special instructions. This will take place at [address] [facility] [address]) [address], [place] [address]  Please note: We ask at that you not bring children with you during ultrasound (echo/ vascular)  testing. Due to room size and safety concerns, children are not allowed in the ultrasound rooms during exams. Our front office staff cannot provide observation of children in our lobby area while testing is being conducted. An adult accompanying a patient to their appointment will only be allowed in the ultrasound room at the discretion of the ultrasound technician under special circumstances. We apologize for any inconvenience.   Your physician has recommended that you wear a Zio monitor.   This monitor is a medical device that records the heart's electrical activity. Doctors most often use these monitors to diagnose arrhythmias. Arrhythmias are problems with the speed or rhythm of the heartbeat. The monitor is a small device applied to your chest. You can wear one while you do your normal daily activities. While wearing this monitor if you have any symptoms to push the button and record what you felt. Once you have worn this monitor for the period of time provider prescribed (Usually 14 days), you will return the monitor device in the postage paid box. Once it is returned they will download the data collected and provide us  with a report which the provider will then review and we will call you with those results. Important tips:  Avoid showering during the first 24 hours of wearing the monitor. Avoid excessive sweating to help maximize wear time. Do not submerge the device, no hot tubs, and no swimming pools. Keep any lotions or oils away from the patch. After 24 hours you may shower with the patch on. Take brief showers with your back facing the shower head.  Do not remove patch once it has been placed because that will interrupt data and decrease adhesive wear time. Push the button when you have any symptoms and write down what you were feeling. Once you have completed wearing your monitor, remove and place into box which has postage paid and place in your outgoing mailbox.  If for some reason you  have misplaced your box then call our office and we can provide another box and/or mail it off for you.  Follow-Up: At [facility], you and your health needs are our priority.  As part of our continuing mission to provide you with exceptional heart care, our providers are all part of one team.  This team includes your primary Cardiologist (physician) and Advanced Practice Providers or APPs (Physician Assistants and Nurse Practitioners) who all work together to provide you with the care you need, when you need it.  Your next appointment:   3 month(s)  Provider:   You may see Timothy Gollan, MD or one of the following Advanced Practice Providers on your designated Care Team:   Laneta Pintos, NP Gildardo Labrador, PA-C Varney Gentleman, PA-C Cadence Briny Breezes, PA-C Ronald Cockayne, NP Morey Ar, NP    We recommend signing up for the patient portal called "MyChart".  Sign up information is provided on this After Visit Summary.  MyChart is used to connect with patients for Virtual Visits (Telemedicine).  Patients are able to view lab/test results, encounter notes, upcoming appointments, etc.  Non-urgent messages can be sent to your provider as well.   To learn more about what you can do with MyChart, go to [email or web address].

## 2023-10-09 NOTE — Telephone Encounter (Signed)
 Called pt and made him aware that the 9:15 slot is no longer available. Pt scheduled for today at 2:45 pm with Morey Ar, NP.

## 2023-10-12 ENCOUNTER — Other Ambulatory Visit: Payer: Medicare Other

## 2023-10-19 ENCOUNTER — Encounter: Payer: Self-pay | Admitting: Radiation Oncology

## 2023-10-19 ENCOUNTER — Other Ambulatory Visit: Payer: Self-pay | Admitting: *Deleted

## 2023-10-19 ENCOUNTER — Ambulatory Visit
Admission: RE | Admit: 2023-10-19 | Discharge: 2023-10-19 | Disposition: A | Discharge status: Home / Self Care | Payer: Medicare Other | Source: Ambulatory Visit | Attending: Radiation Oncology | Admitting: Radiation Oncology

## 2023-10-19 VITALS — BP 136/84 | HR 93 | Temp 97.6°F | Resp 20 | Wt 193.0 lb

## 2023-10-19 DIAGNOSIS — C61 Malignant neoplasm of prostate: Secondary | ICD-10-CM

## 2023-10-19 DIAGNOSIS — Z191 Hormone sensitive malignancy status: Secondary | ICD-10-CM | POA: Diagnosis not present

## 2023-10-19 DIAGNOSIS — Z923 Personal history of irradiation: Secondary | ICD-10-CM | POA: Insufficient documentation

## 2023-10-19 NOTE — Progress Notes (Signed)
 Radiation Oncology Follow up Note  Name: [name]   Date:   10/19/2023 MRN:  [id number] DOB: [birth year]    This 73 y.o. male presents to the clinic today for 22-month follow-up status post image guided IMRT radiation therapy for Gleason 7 (4+3) adenocarcinoma presenting the PSA to 7.4 range  REFERRING PROVIDER: Kent Pear, MD  HPI: Patient is a 73 year old male now out 22 months having completed image guided IMRT radiation therapy for Gleason 7 (4+3) adenocarcinoma the prostate seen today in routine follow-up clinically he is doing well specifically denies any increased urinary tract symptoms diarrhea fatigue or bone pain.  In the past 6 months his PSA has doubled from 1.1-2.2..  COMPLICATIONS OF TREATMENT: none  FOLLOW UP COMPLIANCE: keeps appointments   PHYSICAL EXAM:  BP 136/84   Pulse 93   Temp 97.6 F (36.4 C) (Tympanic)   Resp 20   Wt 193 lb (87.5 kg)   BMI 29.35 kg/m  Well-developed well-nourished patient in NAD. HEENT reveals PERLA, EOMI, discs not visualized.  Oral cavity is clear. No oral mucosal lesions are identified. Neck is clear without evidence of cervical or supraclavicular adenopathy. Lungs are clear to A&P. Cardiac examination is essentially unremarkable with regular rate and rhythm without murmur rub or thrill. Abdomen is benign with no organomegaly or masses noted. Motor sensory and DTR levels are equal and symmetric in the upper and lower extremities. Cranial nerves II through XII are grossly intact. Proprioception is intact. No peripheral adenopathy or edema is identified. No motor or sensory levels are noted. Crude visual fields are within normal range.  RADIOLOGY RESULTS: PSMA PET scan ordered  PLAN: At this time I ordered a PSMA PET scan also referring the patient to medical oncology for their input.  I have asked to see back in 6 months with repeat PSA.  Should the be evidence of other metastatic disease may offer radiation at that time.   Otherwise patient may benefit from ADT therapy.  I would like to take this opportunity to thank you for allowing me to participate in the care of your patient.Glenis Langdon, MD

## 2023-10-20 ENCOUNTER — Ambulatory Visit: Payer: Self-pay | Admitting: Urology

## 2023-10-20 DIAGNOSIS — C61 Malignant neoplasm of prostate: Secondary | ICD-10-CM

## 2023-10-27 ENCOUNTER — Telehealth: Payer: Self-pay

## 2023-10-27 NOTE — Telephone Encounter (Signed)
 Pt called in wanting to schedule an appointment with Dr. Arita Belch, for injection in his shoulder. He stated that they have worn off.  Pt.also wants to schedule an appointment for his wife Rishab Stoudt, she is experiencing leg pain.

## 2023-10-28 ENCOUNTER — Ambulatory Visit
Admission: RE | Admit: 2023-10-28 | Discharge: 2023-10-28 | Disposition: A | Discharge status: Home / Self Care | Source: Ambulatory Visit | Attending: Radiation Oncology | Admitting: Radiation Oncology

## 2023-10-28 DIAGNOSIS — R911 Solitary pulmonary nodule: Secondary | ICD-10-CM | POA: Insufficient documentation

## 2023-10-28 DIAGNOSIS — C61 Malignant neoplasm of prostate: Secondary | ICD-10-CM | POA: Diagnosis not present

## 2023-10-28 MED ORDER — FLOTUFOLASTAT F 18 GALLIUM 296-5846 MBQ/ML IV SOLN
8.0000 | Freq: Once | INTRAVENOUS | Status: AC
  Administered 2023-10-28: 7.2 via INTRAVENOUS
  Filled 2023-10-28: qty 8

## 2023-10-30 ENCOUNTER — Inpatient Hospital Stay: Attending: Oncology | Admitting: Oncology

## 2023-10-30 ENCOUNTER — Inpatient Hospital Stay

## 2023-10-30 ENCOUNTER — Encounter: Payer: Self-pay | Admitting: Oncology

## 2023-10-30 VITALS — BP 117/73 | HR 84 | Temp 97.4°F | Resp 20 | Ht 68.0 in | Wt 192.3 lb

## 2023-10-30 DIAGNOSIS — I252 Old myocardial infarction: Secondary | ICD-10-CM | POA: Diagnosis not present

## 2023-10-30 DIAGNOSIS — E785 Hyperlipidemia, unspecified: Secondary | ICD-10-CM | POA: Diagnosis not present

## 2023-10-30 DIAGNOSIS — Z87891 Personal history of nicotine dependence: Secondary | ICD-10-CM | POA: Diagnosis not present

## 2023-10-30 DIAGNOSIS — I255 Ischemic cardiomyopathy: Secondary | ICD-10-CM | POA: Diagnosis not present

## 2023-10-30 DIAGNOSIS — Z87442 Personal history of urinary calculi: Secondary | ICD-10-CM | POA: Diagnosis not present

## 2023-10-30 DIAGNOSIS — Z86018 Personal history of other benign neoplasm: Secondary | ICD-10-CM | POA: Diagnosis not present

## 2023-10-30 DIAGNOSIS — Z8042 Family history of malignant neoplasm of prostate: Secondary | ICD-10-CM | POA: Diagnosis not present

## 2023-10-30 DIAGNOSIS — I2510 Atherosclerotic heart disease of native coronary artery without angina pectoris: Secondary | ICD-10-CM | POA: Insufficient documentation

## 2023-10-30 DIAGNOSIS — D3502 Benign neoplasm of left adrenal gland: Secondary | ICD-10-CM | POA: Insufficient documentation

## 2023-10-30 DIAGNOSIS — R911 Solitary pulmonary nodule: Secondary | ICD-10-CM | POA: Insufficient documentation

## 2023-10-30 DIAGNOSIS — I10 Essential (primary) hypertension: Secondary | ICD-10-CM | POA: Insufficient documentation

## 2023-10-30 DIAGNOSIS — Z823 Family history of stroke: Secondary | ICD-10-CM | POA: Insufficient documentation

## 2023-10-30 DIAGNOSIS — Z9089 Acquired absence of other organs: Secondary | ICD-10-CM | POA: Insufficient documentation

## 2023-10-30 DIAGNOSIS — G4733 Obstructive sleep apnea (adult) (pediatric): Secondary | ICD-10-CM | POA: Diagnosis not present

## 2023-10-30 DIAGNOSIS — Z811 Family history of alcohol abuse and dependence: Secondary | ICD-10-CM | POA: Diagnosis not present

## 2023-10-30 DIAGNOSIS — Z825 Family history of asthma and other chronic lower respiratory diseases: Secondary | ICD-10-CM | POA: Insufficient documentation

## 2023-10-30 DIAGNOSIS — E119 Type 2 diabetes mellitus without complications: Secondary | ICD-10-CM | POA: Insufficient documentation

## 2023-10-30 DIAGNOSIS — Z85828 Personal history of other malignant neoplasm of skin: Secondary | ICD-10-CM | POA: Diagnosis not present

## 2023-10-30 DIAGNOSIS — C61 Malignant neoplasm of prostate: Secondary | ICD-10-CM | POA: Insufficient documentation

## 2023-10-30 DIAGNOSIS — N135 Crossing vessel and stricture of ureter without hydronephrosis: Secondary | ICD-10-CM | POA: Insufficient documentation

## 2023-10-30 DIAGNOSIS — Z79899 Other long term (current) drug therapy: Secondary | ICD-10-CM | POA: Insufficient documentation

## 2023-10-30 DIAGNOSIS — Z8249 Family history of ischemic heart disease and other diseases of the circulatory system: Secondary | ICD-10-CM | POA: Diagnosis not present

## 2023-10-30 NOTE — Progress Notes (Signed)
 Seen Dr. Jacalyn Martin 10/19/23. PET 10/28/23.

## 2023-10-30 NOTE — Progress Notes (Signed)
 Hematology/Oncology Consult note [facility] [phone] [phone]  Patient Care Team: Pcp, No as PCP - General Jerelene Monday Deadra Everts, MD as PCP - Cardiology (Cardiology) Devorah Fonder, MD as Consulting Physician (Cardiology) Kent Pear, MD (Inactive) as Consulting Physician (Family Medicine) Arnie Lao, MD as Consulting Physician (Orthopedic Surgery) [facility] (Dermatology) Dustin Gimenez, MD as Consulting Physician (Urology)   Name of the patient: [name]  [id number]  10/11/1950    Reason for referral-prostate cancer   Referring physician-Dr. Jacalyn Martin  Date of visit: 10/30/23   History of presenting illness- Patient is a 73 year old male who was diagnosed with prostate cancer in March 2023.  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  He is s/p gold seed implant in April 2023.  He also received IMRT radiation therapy for his prostate gland back then in April 2023.  He received Lupron  in April 2023 1 dose and none since then.  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  His PSA was noted to be 2.3 in May 2018 and following ADT his PSA went down to less than 0.1 and October 2023.  Since then there has been a gradual increase in his PSA from 0.3-1 0.1-2.2 in May 2025.Patient had a PSMA PET scan in June 2025 which showed mild activity in the prostate gland which was nonspecific but no evidence of metastatic disease.  5 mm right lower lobe lung nodule.  ECOG PS- 1  Pain scale- 0   Review of systems- Review of Systems  Constitutional:  Negative for chills, fever, malaise/fatigue and weight loss.  HENT:  Negative for congestion, ear discharge and nosebleeds.   Eyes:  Negative for blurred vision.  Respiratory:  Negative for cough, hemoptysis, sputum production, shortness of breath and wheezing.   Cardiovascular:  Negative  for chest pain, palpitations, orthopnea and claudication.  Gastrointestinal:  Negative for abdominal pain, blood in stool, constipation, diarrhea, heartburn, melena, nausea and vomiting.  Genitourinary:  Negative for dysuria, flank pain, frequency, hematuria and urgency.  Musculoskeletal:  Negative for back pain, joint pain and myalgias.  Skin:  Negative for rash.  Neurological:  Negative for dizziness, tingling, focal weakness, seizures, weakness and headaches.  Endo/Heme/Allergies:  Does not bruise/bleed easily.  Psychiatric/Behavioral:  Negative for depression and suicidal ideas. The patient does not have insomnia.     Allergies  Allergen Reactions   Hydroxyzine Hcl Other (See Comments)    Weakness, out of this world feeling. sluggishness   Latex Rash and Other (See Comments)    I.e. Elastic= rash to blisters if worn for an extended time  Patient show effects after long exposure.    Patient Active Problem List   Diagnosis Date Noted   Tinea versicolor 12/30/2022   Irregular heart beat 10/10/2022   Depression, major, single episode, mild (HCC) 04/02/2022   Nicotine dependence 12/31/2021   Other and unspecified peripheral vertigo 12/31/2021   Neuropathy 12/31/2021   Anemia 11/11/2021   Left hip pain 07/02/2021   Hearing loss 07/02/2021   Foot cramps 03/20/2021   Anxiety 12/11/2020   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   Multiple vessel coronary artery disease 11/20/2020   Bone spicules of jaw 12/27/2018   Adrenal adenoma 03/03/2017   Right shoulder pain 03/03/2017   OSA (obstructive sleep apnea) 07/30/2016   Low back pain 05/15/2016   Actinic keratoses 02/07/2016   Right hip pain 02/07/2016   Ischemic cardiomyopathy  Uncontrolled type 2 diabetes mellitus with hyperglycemia, without long-term current use of insulin  (HCC) 11/30/2014   CAD s/p CABG (coronary artery disease) 04/12/2011   HTN (hypertension) 04/12/2011   Hyperlipemia 04/12/2011   Prostate cancer (HCC) 04/12/2011      Past Medical History:  Diagnosis Date   Actinic keratoses    Angina    Asthma    BCC (basal cell carcinoma of skin) 09/06/2020   left upper back lateral (EDC) ,  left upper arm anterior (EDC), left upper back medial (EDC)    Bursitis of left elbow 2018   Diabetes mellitus without complication (HCC)    ED (erectile dysfunction)    HTN (hypertension) 04/12/2011   Hyperlipemia 04/12/2011   Ischemic cardiomyopathy    a. echo 2010: EF 45-50%, mild to mod ant and apical wall HK, trace MR; b. cardiac cath 06/2012: EF 40%, mild MR    Multiple vessel coronary artery disease 04/12/2011   a. remote MI 1996; b. MI 1997 s/p PCI - LAD & diag; c. MI 2009: long stenting P-MLAD & Diag & POBA of jailed diag branch; d. cath 2010: 80% ISR of LAD w/ L-R collats, med Rx; e. cath 2012: 50-60% tub mLAD, 60-70% dLAD, FFR 0.75 -->s/p PCI dissection => w/ 2 stents (3 total); f. cath 06/2012: no changes from 2012 cath, med Rx, patent stents -> MV CAD 11/20/20 --> CABG X 5   Myocardial infarction (HCC)    x 5   Obesity    OSA (obstructive sleep apnea)    on CPAP   Osteoarthritis    Renal disorder    kidney stone     Past Surgical History:  Procedure Laterality Date   CORONARY ANGIOPLASTY     x 5   CORONARY ARTERY BYPASS GRAFT N/A 11/20/2020   Procedure: CORONARY ARTERY BYPASS GRAFTING (CABG) x  FIVE (LIMA-dLAD, SVG-PDA, SVG-D1, SeqSVG-OM1-2) ON PUMP  USING LEFT INTERNAL MAMMARY ARTERTY AND LEFT ENDOSCOPIC GREATER SAPHENOUS VEIN CONDUITS, RIGHT LEG OPENED NOT HARVESTED;  Surgeon: Rudine Cos, MD;  Location: [facility] OR;  Service: Open Heart Surgery;  Laterality: N/A;   CORONARY PRESSURE/FFR STUDY N/A 11/15/2020   Procedure: INTRAVASCULAR PRESSURE WIRE/FFR STUDY;  Surgeon: Sammy Crisp, MD;  Location: [facility] INVASIVE CV LAB;  Service: Cardiovascular;  Laterality: N/A;   CORONARY STENT PLACEMENT     x 7   LEFT HEART CATH AND CORONARY ANGIOGRAPHY N/A 11/15/2020   Procedure: LEFT HEART CATH AND CORONARY  ANGIOGRAPHY;  Surgeon: Sammy Crisp, MD;  Location: [facility] INVASIVE CV LAB;  Service: Cardiovascular;  Laterality: N/A;   LEFT HEART CATHETERIZATION WITH CORONARY ANGIOGRAM N/A 04/14/2011   Procedure: LEFT HEART CATHETERIZATION WITH CORONARY ANGIOGRAM;  Surgeon: Hazle Lites, MD;  Location: [facility] CATH LAB;  Service: Cardiovascular;  Laterality: N/A;   LEFT HEART CATHETERIZATION WITH CORONARY ANGIOGRAM N/A 06/25/2012   Procedure: LEFT HEART CATHETERIZATION WITH CORONARY ANGIOGRAM;  Surgeon: Peter M Swaziland, MD;  Location: [facility] CATH LAB;  Service: Cardiovascular;  Laterality: N/A;   TEE WITHOUT CARDIOVERSION N/A 11/20/2020   Procedure: TRANSESOPHAGEAL ECHOCARDIOGRAM (TEE);  Surgeon: Rudine Cos, MD;  Location: [facility] OR;  Service: Open Heart Surgery;  Laterality: N/A;   TONSILLECTOMY AND ADENOIDECTOMY  1955    Social History   Socioeconomic History   Marital status: Married    Spouse name: Alvy Baar   Number of children: 2   Years of education: Not on file   Highest education level: Not on file  Occupational History   Occupation: [employer]-retired  Occupation: Sales  Tobacco Use   Smoking status: Former    Current packs/day: 0.00    Average packs/day: 1.5 packs/day for 40.0 years (60.0 ttl pk-yrs)    Types: Cigarettes    Start date: 09/14/1967    Quit date: 09/14/2007    Years since quitting: 16.1   Smokeless tobacco: Never   Tobacco comments:    quit 2009  Vaping Use   Vaping status: Never Used  Substance and Sexual Activity   Alcohol use: No    Alcohol/week: 0.0 standard drinks of alcohol    Comment: rarely 1 beer in last 5 years   Drug use: Not Currently    Types: Marijuana    Comment: pt stopped fall 2012   Sexual activity: Never  Other Topics Concern   Not on file  Social History Narrative   Retired twice    Married to [employer], has 2 children from previous marriage   Pet: 3 dogs    Social Drivers of Corporate investment banker Strain: Low Risk  (12/25/2022)   Overall  Financial Resource Strain (CARDIA)    Difficulty of Paying Living Expenses: Not very hard  Food Insecurity: No Food Insecurity (10/30/2023)   Hunger Vital Sign    Worried About Running Out of Food in the Last Year: Never true    Ran Out of Food in the Last Year: Never true  Transportation Needs: No Transportation Needs (10/30/2023)   PRAPARE - Administrator, Civil Service (Medical): No    Lack of Transportation (Non-Medical): No  Physical Activity: Sufficiently Active (12/25/2022)   Exercise Vital Sign    Days of Exercise per Week: 7 days    Minutes of Exercise per Session: 30 min  Stress: No Stress Concern Present (12/25/2022)   [employer] of Occupational Health - Occupational Stress Questionnaire    Feeling of Stress : Not at all  Social Connections: Moderately Integrated (12/25/2022)   Social Connection and Isolation Panel    Frequency of Communication with Friends and Family: Twice a week    Frequency of Social Gatherings with Friends and Family: More than three times a week    Attends Religious Services: Never    Database administrator or Organizations: Yes    Attends Banker Meetings: Never    Marital Status: Married  Catering manager Violence: Not At Risk (10/30/2023)   Humiliation, Afraid, Rape, and Kick questionnaire    Fear of Current or Ex-Partner: No    Emotionally Abused: No    Physically Abused: No    Sexually Abused: No     Family History  Problem Relation Age of Onset   COPD Mother    Other Father        muscle tumor   Stroke Father    Prostate cancer Father        mets   Heart disease Father    Alcoholism Father    Stroke Sister    Heart block Sister        Heart bypass   Bladder Cancer Neg Hx    Kidney cancer Neg Hx      Current Outpatient Medications:    acetaminophen  (TYLENOL ) 500 MG tablet, Take 2 tablets (1,000 mg total) by mouth every 6 (six) hours as needed., Disp: 100 tablet, Rfl: 2   aspirin  EC 325 MG EC tablet,  Take 1 tablet (325 mg total) by mouth daily., Disp: 30 tablet, Rfl: 0   atorvastatin  (LIPITOR ) 80 MG tablet,  Take 1 tablet (80 mg total) by mouth daily., Disp: 90 tablet, Rfl: 3   blood glucose meter kit and supplies KIT, Dispense based on patient and insurance preference. Use up to four times daily as directed., Disp: 1 each, Rfl: 0   carvedilol  (COREG ) 6.25 MG tablet, Take 1 tablet (6.25 mg total) by mouth 2 (two) times daily with a meal., Disp: 180 tablet, Rfl: 3   ezetimibe  (ZETIA ) 10 MG tablet, TAKE 1 TABLET DAILY, Disp: 90 tablet, Rfl: 1   furosemide  (LASIX ) 40 MG tablet, Take 1 tablet (40 mg total) by mouth daily., Disp: 90 tablet, Rfl: 3   insulin  glargine (LANTUS ) 100 UNIT/ML Solostar Pen, Inject 30 Units into the skin daily., Disp: 30 mL, Rfl: 2   Insulin  Pen Needle 31G X 5 MM MISC, Inject insulin  via insulin  pen daily, Disp: 50 each, Rfl: 0   isosorbide  mononitrate (IMDUR ) 30 MG 24 hr tablet, Take 1 tablet (30 mg total) by mouth daily., Disp: 90 tablet, Rfl: 3   ketoconazole  (NIZORAL ) 2 % cream, Apply twice daily to pink, scaly areas on face, chest, arms until improved., Disp: 60 g, Rfl: 3   losartan  (COZAAR ) 25 MG tablet, Take 1 tablet (25 mg total) by mouth daily., Disp: 90 tablet, Rfl: 3   Magnesium  250 MG TABS, Take 1 tablet by mouth daily., Disp: , Rfl:    metFORMIN  (GLUCOPHAGE ) 1000 MG tablet, TAKE 1 TABLET TWICE A DAY WITH MEALS, Disp: 180 tablet, Rfl: 3   Multiple Vitamins-Minerals (MULTIVITAMINS THER. W/MINERALS) TABS, Take 1 tablet by mouth every morning., Disp: , Rfl:    niacin  (NIASPAN ) 1000 MG CR tablet, Take 2 tablets (2000 mg) by mouth once daily at bedtime, Disp: 180 tablet, Rfl: 3   nitroGLYCERIN  (NITROLINGUAL ) 0.4 MG/SPRAY spray, Place 1 spray under the tongue every 5 (five) minutes x 3 doses as needed for chest pain., Disp: 12 g, Rfl: 6   NON FORMULARY, Take 1-2 tablets by mouth daily as needed (Pain). CBD Gummy, Disp: , Rfl:    OZEMPIC , 1 MG/DOSE, 4 MG/3ML SOPN, INJECT  1 MG WEEKLY AS DIRECTED, Disp: 9 mL, Rfl: 3   potassium chloride  SA (KLOR-CON  M) 20 MEQ tablet, Take 1 tablet (20 mEq total) by mouth daily., Disp: 90 tablet, Rfl: 3   Physical exam:  Vitals:   10/30/23 1041 10/30/23 1054  BP: (!) 122/111 117/73  Pulse: 84   Resp: 20   Temp: (!) 97.4 F (36.3 C)   TempSrc: Tympanic   SpO2: 98%   Weight: 192 lb 4.8 oz (87.2 kg)   Height: 5' 8 (1.727 m)    Physical Exam  Cardiovascular:     Rate and Rhythm: Normal rate and regular rhythm.     Heart sounds: Normal heart sounds.  Pulmonary:     Effort: Pulmonary effort is normal.     Breath sounds: Normal breath sounds.  Abdominal:     General: Bowel sounds are normal.     Palpations: Abdomen is soft.   Skin:    General: Skin is warm and dry.   Neurological:     Mental Status: He is alert and oriented to person, place, and time.           Latest Ref Rng & Units 08/18/2023    5:40 AM  CMP  Glucose 70 - 99 mg/dL 161   BUN 8 - 23 mg/dL 20   Creatinine 0.96 - 1.24 mg/dL 0.45   Sodium 409 - 811 mmol/L 135  Potassium 3.5 - 5.1 mmol/L 4.0   Chloride 98 - 111 mmol/L 103   CO2 22 - 32 mmol/L 23   Calcium  8.9 - 10.3 mg/dL 9.4       Latest Ref Rng & Units 08/18/2023    5:40 AM  CBC  WBC 4.0 - 10.5 K/uL 9.6   Hemoglobin 13.0 - 17.0 g/dL 16.1   Hematocrit 09.6 - 52.0 % 41.6   Platelets 150 - 400 K/uL 183     No images are attached to the encounter.  NM PET (PSMA) SKULL TO MID THIGH Result Date: 10/28/2023 CLINICAL DATA:  Prostate carcinoma with biochemical recurrence. EXAM: NUCLEAR MEDICINE PET SKULL BASE TO THIGH TECHNIQUE: mCi F18 Piflufolastat (Pylarify ) was injected intravenously. Full-ring PET imaging was performed from the skull base to thigh after the radiotracer. CT data was obtained and used for attenuation correction and anatomic localization. COMPARISON:  None Available. FINDINGS: NECK No radiotracer activity in neck lymph nodes. Incidental CT finding: None. CHEST No  radiotracer accumulation within mediastinal or hilar lymph nodes. Small 5 mm nodule in the posteroinferior RIGHT lower lobe on image 82/6. No radiotracer active ABDOMEN/PELVIS Prostate: Mild radiotracer activity within the prostate gland. Some focality in the posterior LEFT lobe. Lymph nodes: No abnormal radiotracer accumulation within pelvic or abdominal nodes. Liver: No evidence of liver metastasis. Incidental CT finding: Benign adrenal adenoma of the LEFT adrenal gland measures 2.2 cm. SKELETON No focal activity to suggest skeletal metastasis. IMPRESSION: 1. Mild activity within the prostate gland is nonspecific. 2. No evidence of metastatic adenopathy in the pelvis or periaortic retroperitoneum. 3. No visceral metastasis or skeletal metastasis. 4. Small nodule in the RIGHT lower lobe is favored benign. Recommend attention on follow-up Electronically Signed   By: Deboraha Fallow M.D.   On: 10/28/2023 15:06    Assessment and plan- Patient is a 73 y.o. male referred for nonmetastatic castrate sensitive prostate cancer  Patient was diagnosed with T2 N0 prostate cancer back in April 2023.  He received IMRT at that time as well as 1 dose of ADT.  PSA has been gradually increasing since then with a PSA doubling time presently at about 4.2 months.  He was noted to have a PSA of greater than 2 in May 2025 marking biochemical recurrence which was 2 years after his radiation treatment.  Moreover patient does not have high risk disease such as Gleason's 8 or 9 adenocarcinoma.  Therefore inclined to monitor his biochemical recurrence conservatively without introducing ADT just yet.  PSA levels in 3 months in 6 months and I will see him back in 6 months.  There is a persistent increase in his PSA I will consider initiating ADT at that time.  Patient continues to follow-up with urology and radiation oncology.  I have reviewed PSMA PETScan images independently and discussed findings with the patient which does not show  any evidence of recurrent or progressive disease.  Discussed differences between castrate sensitive and castrate resistant disease.  Discussed definition of biochemical recurrence and management for the same.   Thank you for this kind referral and the opportunity to participate in the care of this  Patient   Visit Diagnosis 1. Prostate cancer [facility])     Dr. Seretha Dance, MD, MPH [facility] [phone] 10/30/2023

## 2023-11-04 ENCOUNTER — Ambulatory Visit: Attending: Student

## 2023-11-04 ENCOUNTER — Ambulatory Visit (INDEPENDENT_AMBULATORY_CARE_PROVIDER_SITE_OTHER)

## 2023-11-04 DIAGNOSIS — I739 Peripheral vascular disease, unspecified: Secondary | ICD-10-CM

## 2023-11-04 LAB — VAS US ABI WITH/WO TBI
Left ABI: 1.27
Right ABI: 0.72

## 2023-11-05 ENCOUNTER — Ambulatory Visit: Payer: Self-pay | Admitting: Student

## 2023-11-05 ENCOUNTER — Ambulatory Visit: Admitting: General Practice

## 2023-11-05 ENCOUNTER — Ambulatory Visit: Payer: Self-pay | Admitting: Emergency Medicine

## 2023-11-05 DIAGNOSIS — I739 Peripheral vascular disease, unspecified: Secondary | ICD-10-CM

## 2023-11-05 DIAGNOSIS — Z7689 Persons encountering health services in other specified circumstances: Secondary | ICD-10-CM

## 2023-11-05 NOTE — Progress Notes (Signed)
 Referral for Peripheral Vascular Consult w/Dr. Ariada placed.

## 2023-11-16 ENCOUNTER — Ambulatory Visit: Payer: Self-pay | Admitting: Internal Medicine

## 2023-11-16 ENCOUNTER — Ambulatory Visit (INDEPENDENT_AMBULATORY_CARE_PROVIDER_SITE_OTHER): Admitting: Internal Medicine

## 2023-11-16 VITALS — BP 139/93 | HR 86 | Temp 98.2°F | Ht 68.0 in | Wt 192.8 lb

## 2023-11-16 DIAGNOSIS — I2510 Atherosclerotic heart disease of native coronary artery without angina pectoris: Secondary | ICD-10-CM

## 2023-11-16 DIAGNOSIS — C61 Malignant neoplasm of prostate: Secondary | ICD-10-CM

## 2023-11-16 DIAGNOSIS — I10 Essential (primary) hypertension: Secondary | ICD-10-CM | POA: Diagnosis not present

## 2023-11-16 DIAGNOSIS — F320 Major depressive disorder, single episode, mild: Secondary | ICD-10-CM

## 2023-11-16 DIAGNOSIS — Z7984 Long term (current) use of oral hypoglycemic drugs: Secondary | ICD-10-CM

## 2023-11-16 DIAGNOSIS — E1165 Type 2 diabetes mellitus with hyperglycemia: Secondary | ICD-10-CM | POA: Diagnosis not present

## 2023-11-16 DIAGNOSIS — Z7985 Long-term (current) use of injectable non-insulin antidiabetic drugs: Secondary | ICD-10-CM

## 2023-11-16 DIAGNOSIS — E7800 Pure hypercholesterolemia, unspecified: Secondary | ICD-10-CM | POA: Diagnosis not present

## 2023-11-16 DIAGNOSIS — H6990 Unspecified Eustachian tube disorder, unspecified ear: Secondary | ICD-10-CM | POA: Insufficient documentation

## 2023-11-16 DIAGNOSIS — I255 Ischemic cardiomyopathy: Secondary | ICD-10-CM

## 2023-11-16 DIAGNOSIS — H6992 Unspecified Eustachian tube disorder, left ear: Secondary | ICD-10-CM | POA: Diagnosis not present

## 2023-11-16 LAB — MICROALBUMIN / CREATININE URINE RATIO
Creatinine,U: 64.5 mg/dL
Microalb Creat Ratio: 31.5 mg/g — ABNORMAL HIGH (ref 0.0–30.0)
Microalb, Ur: 2 mg/dL — ABNORMAL HIGH (ref 0.0–1.9)

## 2023-11-16 LAB — COMPREHENSIVE METABOLIC PANEL WITH GFR
ALT: 16 U/L (ref 0–53)
AST: 18 U/L (ref 0–37)
Albumin: 4.5 g/dL (ref 3.5–5.2)
Alkaline Phosphatase: 47 U/L (ref 39–117)
BUN: 15 mg/dL (ref 6–23)
CO2: 27 meq/L (ref 19–32)
Calcium: 9.6 mg/dL (ref 8.4–10.5)
Chloride: 104 meq/L (ref 96–112)
Creatinine, Ser: 1.08 mg/dL (ref 0.40–1.50)
GFR: 68.33 mL/min (ref >60.00)
Glucose, Bld: 105 mg/dL — ABNORMAL HIGH (ref 70–99)
Potassium: 4.3 meq/L (ref 3.5–5.1)
Sodium: 137 meq/L (ref 135–145)
Total Bilirubin: 0.8 mg/dL (ref 0.2–1.2)
Total Protein: 6.9 g/dL (ref 6.0–8.3)

## 2023-11-16 LAB — POCT GLYCOSYLATED HEMOGLOBIN (HGB A1C): Hemoglobin A1C: 6.1 % — AB (ref 4.0–5.6)

## 2023-11-16 LAB — LIPID PANEL
Cholesterol: 181 mg/dL (ref 0–200)
HDL: 40.8 mg/dL (ref >39.00)
LDL Cholesterol: 115 mg/dL — ABNORMAL HIGH (ref 0–99)
NonHDL: 140.26
Total CHOL/HDL Ratio: 4
Triglycerides: 124 mg/dL (ref 0.0–149.0)
VLDL: 24.8 mg/dL (ref 0.0–40.0)

## 2023-11-16 MED ORDER — METFORMIN HCL 1000 MG PO TABS: 1000.0000 mg | ORAL_TABLET | Freq: Two times a day (BID) | ORAL | 3 refills | Status: AC

## 2023-11-16 MED ORDER — SEMAGLUTIDE (2 MG/DOSE) 8 MG/3ML ~~LOC~~ SOPN: 2.0000 mg | PEN_INJECTOR | SUBCUTANEOUS | 1 refills | Status: DC

## 2023-11-16 MED ORDER — INSULIN GLARGINE 100 UNIT/ML SOLOSTAR PEN: 25.0000 [IU] | PEN_INJECTOR | Freq: Every day | SUBCUTANEOUS | 2 refills | Status: AC

## 2023-11-16 NOTE — Assessment & Plan Note (Signed)
-   This problem is chronic and stable -If PHQ-9 score today was 1 -No indication for medication at this time -Will continue to monitor

## 2023-11-16 NOTE — Assessment & Plan Note (Signed)
-   Patient blood pressure is mildly elevated today at 140/82 -He states that he is compliant with his current medication regimen including carvedilol  6.25 mg twice daily, Lasix  40 mg daily, Imdur  30 mg daily as well as losartan  25 mg daily -Will continue with current medication regimen for now -Will have patient follow-up in 3 months for further evaluation.  If his blood pressure remains elevated will consider increasing losartan  to 50 mg daily

## 2023-11-16 NOTE — Assessment & Plan Note (Signed)
-   This problem is chronic and stable -Patient is on atorvastatin  80 mg daily and Zetia  10 mg daily -We will recheck his lipid panel today -No further workup at this time

## 2023-11-16 NOTE — Progress Notes (Signed)
 Established Patient Office Visit  Subjective   Patient ID: [name], male    DOB: [birth year]  Age: 73 y.o. MRN: [id number]  Chief Complaint  Patient presents with   Medical Management of Chronic Issues    HPI  Patient is here for routine follow-up of his chronic medical issues including diabetes, hypertension, CAD and hyperlipidemia.  His blood pressure is mildly elevated today at 140/82.  Patient states that he is compliant with his current medication regimen  He states that he has not checked his blood sugars recently but has been compliant with his Lantus  30 units daily as well as metformin  1000 mg twice daily and Ozempic  1 mg weekly  He follows up with cardiology for his CAD and ischemic cardiomyopathy and is compliant with his current medication regimen.  Of note, he had an ABI done recently which showed moderate disease in his right lower extremity for which he has follow-up with Dr.Arida.  Patient also has a history of prostate cancer for which he follows up with Dr. Melanee.  His PSA has been slowly increasing and he has close follow-up with oncology for this to see if he will need further treatment.  Review of Systems  Constitutional: Negative.   HENT:  Positive for hearing loss. Negative for congestion, ear discharge and sinus pain.        Patient complains of pressure, muffled hearing in his left ear and states that this happens yearly for multiple months at a time  Respiratory: Negative.    Cardiovascular: Negative.   Gastrointestinal: Negative.   Musculoskeletal: Negative.   Neurological: Negative.   Psychiatric/Behavioral: Negative.        Objective:     BP (!) 139/93   Pulse 86   Temp 98.2 F (36.8 C) (Oral)   Ht 5' 8 (1.727 m)   Wt 192 lb 12.8 oz (87.5 kg)   SpO2 98%   BMI 29.32 kg/m    Physical Exam Constitutional:      Appearance: Normal appearance.  HENT:     Head: Normocephalic and atraumatic.     Mouth/Throat:     Pharynx:  Oropharynx is clear. No oropharyngeal exudate.  Cardiovascular:     Rate and Rhythm: Normal rate and regular rhythm.     Heart sounds: Normal heart sounds. No murmur heard. Pulmonary:     Effort: No respiratory distress.     Breath sounds: Normal breath sounds. No wheezing or rales.  Abdominal:     General: Bowel sounds are normal. There is no distension.     Palpations: Abdomen is soft.     Tenderness: There is no abdominal tenderness.  Musculoskeletal:        General: Swelling present. No tenderness.     Cervical back: Neck supple.     Comments: 1+ bilateral lower extremity pitting edema noted  Neurological:     Mental Status: He is alert and oriented to person, place, and time.  Psychiatric:        Mood and Affect: Mood normal.        Behavior: Behavior normal.      Results for orders placed or performed in visit on 11/16/23  POCT HgB A1C  Result Value Ref Range   Hemoglobin A1C 6.1 (A) 4.0 - 5.6 %   HbA1c POC (<> result, manual entry)     HbA1c, POC (prediabetic range)     HbA1c, POC (controlled diabetic range)        The ASCVD Risk  score (Arnett DK, et al., 2019) failed to calculate for the following reasons:   Risk score cannot be calculated because patient has a medical history suggesting prior/existing ASCVD    Assessment & Plan:   Problem List Items Addressed This Visit       Cardiovascular and Mediastinum   CAD s/p CABG (coronary artery disease) (Chronic)   - This problem is chronic and stable -Patient is status post CABG in 2022 -Patient follows up with cardiology for this and had a recent visit with them at the end of May -Will continue with current medication regimen including carvedilol  6.25 mg twice daily, atorvastatin  80 mg daily, Zetia  10 mg daily, Imdur  30 mg daily as well as losartan  25 mg daily -Of note, patient was noted to have right-sided PAD on ABIs done at cardiology office and will follow-up with Dr.Arida for further evaluation -No further  workup at this time      HTN (hypertension) (Chronic)   - Patient blood pressure is mildly elevated today at 140/82 -He states that he is compliant with his current medication regimen including carvedilol  6.25 mg twice daily, Lasix  40 mg daily, Imdur  30 mg daily as well as losartan  25 mg daily -Will continue with current medication regimen for now -Will have patient follow-up in 3 months for further evaluation.  If his blood pressure remains elevated will consider increasing losartan  to 50 mg daily      Ischemic cardiomyopathy   - This problem is chronic and stable -Patient is on a beta-blocker, ARB, Imdur  as well as torsemide 40 mg daily -Last echo in 2022 showed an EF of 40 to 45% -He was noted to have 1+ bilateral lower extremity pitting edema today which he states is chronic.  His lungs were clear to auscultation bilaterally and denies any shortness of breath currently -Patient follows up with cardiology for this regularly -Will continue current medication regimen for now        Endocrine   Uncontrolled type 2 diabetes mellitus with hyperglycemia, without long-term current use of insulin  (HCC) - Primary (Chronic)   - This problem is chronic and improving -Patient states he is compliant with his Lantus  30 units daily, metformin  1000 mg twice daily and Ozempic  1 mg weekly -A1c today 6.1 -He does complain of some episodes of low blood sugars when he has not eaten his meal on time -Will decrease his Lantus  to 25 units daily and increase his Ozempic  to 2 mg weekly and continue the metformin  1000 mg twice daily -Patient will need follow-up in 2 months for further evaluation and down titration of his Lantus  if possible -Will recheck his CMP and urine microalbumin creatinine ratio today -No further workup at this time      Relevant Medications   metFORMIN  (GLUCOPHAGE ) 1000 MG tablet   Semaglutide , 2 MG/DOSE, 8 MG/3ML SOPN   insulin  glargine (LANTUS ) 100 UNIT/ML Solostar Pen   Other  Relevant Orders   Comprehensive metabolic panel with eGFR (no eGFR if sent to [facility]) [LAB17]   Urine Albumin /Creatinine with ratio (send out) [LAB689]   POCT HgB A1C (Completed)     Nervous and Auditory   Eustachian tube dysfunction, left   - Patient complains of left-sided ear fullness, decreased hearing and occasional pain -He states this happens for multiple months in a year and intermittently gets better after his ear pops but then recurs -Will refer patient to ENT for further evaluation for eustachian tube dysfunction -No further workup at this time  Relevant Orders   Ambulatory referral to ENT     Genitourinary   Prostate cancer (HCC) (Chronic)   - This problem is chronic -He follows closely with oncology-Dr. Melanee -His PSA has been trending upwards and there is concern for possible recurrence.  He will get a repeat PSA in 3 months and follow-up with Dr. Melanee for further treatment if necessary -No further workup at this time        Other   Hyperlipemia (Chronic)   - This problem is chronic and stable -Patient is on atorvastatin  80 mg daily and Zetia  10 mg daily -We will recheck his lipid panel today -No further workup at this time      Relevant Orders   Lipid panel   Depression, major, single episode, mild (HCC)   - This problem is chronic and stable -If PHQ-9 score today was 1 -No indication for medication at this time -Will continue to monitor       No follow-ups on file.    Aleesia Henney, MD

## 2023-11-16 NOTE — Patient Instructions (Addendum)
-   It was a pleasure meeting you today -We will check some blood work on you today including your liver function, kidney function, urine protein, cholesterol levels -Your A1c is well-controlled at 6.1.  We will decrease your insulin  to 25 units at night and increase the semaglutide  to 2 mg weekly.  Continue with your metformin  for now -I have put in refills for all of these medications to your pharmacy for you -Please follow-up with Dr. Darron for the blockage in your blood vessel in your right leg -Please continue to follow-up with cardiology for your coronary artery disease -Please contact us  with any questions or concerns or if you need any refills -Please follow-up with us  in 3 months for repeat A1c and possible titration of your insulin  if A1c remains well-controlled

## 2023-11-16 NOTE — Assessment & Plan Note (Addendum)
-   This problem is chronic and improving -Patient states he is compliant with his Lantus  30 units daily, metformin  1000 mg twice daily and Ozempic  1 mg weekly -A1c today 6.1 -He does complain of some episodes of low blood sugars when he has not eaten his meal on time -Will decrease his Lantus  to 25 units daily and increase his Ozempic  to 2 mg weekly and continue the metformin  1000 mg twice daily -Patient will need follow-up in 2 months for further evaluation and down titration of his Lantus  if possible -Will recheck his CMP and urine microalbumin creatinine ratio today -No further workup at this time

## 2023-11-16 NOTE — Assessment & Plan Note (Addendum)
-   Patient complains of left-sided ear fullness, decreased hearing and occasional pain -He states this happens for multiple months in a year and intermittently gets better after his ear pops but then recurs -Will refer patient to ENT for further evaluation for eustachian tube dysfunction -No further workup at this time

## 2023-11-16 NOTE — Assessment & Plan Note (Addendum)
-   This problem is chronic and stable -Patient is on a beta-blocker, ARB, Imdur  as well as torsemide 40 mg daily -Last echo in 2022 showed an EF of 40 to 45% -He was noted to have 1+ bilateral lower extremity pitting edema today which he states is chronic.  His lungs were clear to auscultation bilaterally and denies any shortness of breath currently -Patient follows up with cardiology for this regularly -Will continue current medication regimen for now

## 2023-11-16 NOTE — Assessment & Plan Note (Signed)
-   This problem is chronic -He follows closely with oncology-Dr. Melanee -His PSA has been trending upwards and there is concern for possible recurrence.  He will get a repeat PSA in 3 months and follow-up with Dr. Melanee for further treatment if necessary -No further workup at this time

## 2023-11-16 NOTE — Assessment & Plan Note (Signed)
-   This problem is chronic and stable -Patient is status post CABG in 2022 -Patient follows up with cardiology for this and had a recent visit with them at the end of May -Will continue with current medication regimen including carvedilol  6.25 mg twice daily, atorvastatin  80 mg daily, Zetia  10 mg daily, Imdur  30 mg daily as well as losartan  25 mg daily -Of note, patient was noted to have right-sided PAD on ABIs done at cardiology office and will follow-up with Dr.Arida for further evaluation -No further workup at this time

## 2023-11-18 ENCOUNTER — Other Ambulatory Visit: Payer: Self-pay

## 2023-11-18 DIAGNOSIS — E7800 Pure hypercholesterolemia, unspecified: Secondary | ICD-10-CM

## 2023-11-18 MED ORDER — ATORVASTATIN CALCIUM 80 MG PO TABS
80.0000 mg | ORAL_TABLET | Freq: Every day | ORAL | 3 refills | Status: AC
Start: 2023-11-18 — End: ?

## 2023-11-20 DIAGNOSIS — E119 Type 2 diabetes mellitus without complications: Secondary | ICD-10-CM | POA: Diagnosis not present

## 2023-11-20 DIAGNOSIS — H2640 Unspecified secondary cataract: Secondary | ICD-10-CM | POA: Diagnosis not present

## 2023-11-20 DIAGNOSIS — H2513 Age-related nuclear cataract, bilateral: Secondary | ICD-10-CM | POA: Diagnosis not present

## 2023-11-25 ENCOUNTER — Telehealth: Payer: Self-pay | Admitting: Cardiovascular Disease

## 2023-11-25 MED ORDER — NIACIN ER (ANTIHYPERLIPIDEMIC) 1000 MG PO TBCR: EXTENDED_RELEASE_TABLET | ORAL | 3 refills | Status: AC

## 2023-11-25 NOTE — Telephone Encounter (Signed)
 Pt's medication was sent to pt's pharmacy as requested. Confirmation received.

## 2023-11-25 NOTE — Telephone Encounter (Signed)
*  STAT* If patient is at the pharmacy, call can be transferred to refill team.   1. Which medications need to be refilled? (please list name of each medication and dose if known)  niacin  (NIASPAN ) 1000 MG CR tablet  2. Which pharmacy/location (including street and city if local pharmacy) is medication to be sent to? [facility] - [place], [place] - [address] Phone: [phone]  Fax: [phone]     3. Do they need a 30 day or 90 day supply? 90

## 2023-11-30 DIAGNOSIS — H2511 Age-related nuclear cataract, right eye: Secondary | ICD-10-CM | POA: Diagnosis not present

## 2023-11-30 DIAGNOSIS — H2513 Age-related nuclear cataract, bilateral: Secondary | ICD-10-CM | POA: Diagnosis not present

## 2023-12-04 DIAGNOSIS — R002 Palpitations: Secondary | ICD-10-CM | POA: Diagnosis not present

## 2023-12-07 ENCOUNTER — Ambulatory Visit: Admitting: Orthopaedic Surgery

## 2023-12-08 ENCOUNTER — Encounter: Payer: Self-pay | Admitting: Ophthalmology

## 2023-12-09 ENCOUNTER — Encounter: Payer: Self-pay | Admitting: Ophthalmology

## 2023-12-09 NOTE — Anesthesia Preprocedure Evaluation (Addendum)
 Anesthesia Evaluation  Patient identified by MRN, date of birth, ID band Patient awake    Reviewed: Allergy & Precautions, H&P , NPO status , Patient's Chart, lab work & pertinent test results  Airway Mallampati: III  TM Distance: >3 FB Neck ROM: Full   Comment: beard Dental  (+) Poor Dentition, Missing   Pulmonary asthma , sleep apnea , former smoker   Pulmonary exam normal breath sounds clear to auscultation       Cardiovascular hypertension, + angina  + CAD and + Past MI  Normal cardiovascular exam Rhythm:Regular Rate:Normal  11-15-20 cath 1. Three-vessel coronary artery disease with 90% ostial/proximal LAD stenosis that appears to be within old stent though heavy calcification confounds visualization, long proximal through distal LCx disease of up to 70% that is hemodynamically significant (RFR = 0.76), and 50% distal RCA stenosis that is borderline significant (RFR = 0.90). 2. Severely reduced left ventricular systolic function (LVEF 25-35%) with mildly elevated filling pressure (LVEDP ~20 mmHg).    05-06-21 echo 1. Left ventricular ejection fraction, by estimation, is 40 to 45%. The  left ventricle has mildly decreased function. The left ventricle  demonstrates regional wall motion abnormalities (hypokinesis of the  anterior, mid to apical anteroseptal and apical  regions). Left ventricular diastolic parameters are consistent with Grade  I diastolic dysfunction (impaired relaxation). The average left  ventricular global longitudinal strain is -13.9 %. The global longitudinal  strain is abnormal.   2. Right ventricular systolic function is normal. The right ventricular  size is normal. There is mildly elevated pulmonary artery systolic  pressure. The estimated right ventricular systolic pressure is 44.9 mmHg.   3. Left atrial size was moderately dilated.   4. The mitral valve is normal in structure. Mild to moderate mitral  valve  regurgitation. No evidence of mitral stenosis.   5. The aortic valve is calcified. Aortic valve regurgitation is not  visualized. Aortic valve sclerosis is present, with no evidence of aortic  valve stenosis.   6. The inferior vena cava is normal in size with greater than 50%  respiratory variability, suggesting right atrial pressure of 3 mmHg.   Comparison(s): LVEF 30-35%.     Neuro/Psych  PSYCHIATRIC DISORDERS Anxiety Depression    negative neurological ROS  negative psych ROS   GI/Hepatic negative GI ROS, Neg liver ROS,,,  Endo/Other  diabetes    Renal/GU Renal diseasenegative Renal ROS  negative genitourinary   Musculoskeletal negative musculoskeletal ROS (+) Arthritis ,    Abdominal   Peds negative pediatric ROS (+)  Hematology negative hematology ROS (+) Blood dyscrasia, anemia   Anesthesia Other Findings MI 1996, 1997  stent to LAD and diagnonal, MI 2009, multiple caths and stents  Note elevated RVSP, indidcating likely at least mild pulmonary hypertension Grade I diastolic dysfunction Myocardial infarction [facility])  Multiple vessel coronary artery disease HTN (hypertension)  Hyperlipemia Angina  OSA (obstructive sleep apnea) Osteoarthritis  ED (erectile dysfunction) Diabetes mellitus without complication Ischemic cardiomyopathy Obesity  Asthma Bursitis of left elbow  Renal disorder BCC (basal cell carcinoma of skin) Actinic keratoses Uncontrolled type 2 diabetes mellitus with hyperglycemia, without long-term current use of insulin  (HCC)  CAD (coronary artery disease) History of heart artery stent  Grade I diastolic dysfunction    Reproductive/Obstetrics negative OB ROS                              Anesthesia Physical Anesthesia Plan  ASA: 3  Anesthesia Plan: MAC   Post-op Pain Management:    Induction: Intravenous  PONV Risk Score and Plan:   Airway Management Planned: Natural Airway and Nasal  Cannula  Additional Equipment:   Intra-op Plan:   Post-operative Plan:   Informed Consent: I have reviewed the patients History and Physical, chart, labs and discussed the procedure including the risks, benefits and alternatives for the proposed anesthesia with the patient or authorized representative who has indicated his/her understanding and acceptance.     Dental Advisory Given  Plan Discussed with: Anesthesiologist, CRNA and Surgeon  Anesthesia Plan Comments: (Patient consented for risks of anesthesia including but not limited to:  - adverse reactions to medications - damage to eyes, teeth, lips or other oral mucosa - nerve damage due to positioning  - sore throat or hoarseness - Damage to heart, brain, nerves, lungs, other parts of body or loss of life  Patient voiced understanding and assent.)         Anesthesia Quick Evaluation

## 2023-12-15 DIAGNOSIS — R002 Palpitations: Secondary | ICD-10-CM

## 2023-12-17 NOTE — Discharge Instructions (Signed)

## 2023-12-18 ENCOUNTER — Ambulatory Visit: Admitting: Orthopedic Surgery

## 2023-12-18 ENCOUNTER — Encounter: Payer: Self-pay | Admitting: Orthopedic Surgery

## 2023-12-18 DIAGNOSIS — M19011 Primary osteoarthritis, right shoulder: Secondary | ICD-10-CM | POA: Diagnosis not present

## 2023-12-18 NOTE — Progress Notes (Signed)
 Office Visit Note   Patient: [name]           Date of Birth: 03-Sep-1950           MRN: [id number] Visit Date: 12/18/2023 Requested by: No referring provider defined for this encounter. PCP: Pcp, No  Subjective: Chief Complaint  Patient presents with   Right Shoulder - Pain    HPI: [name] is a 73 y.o. male who presents to the office reporting right shoulder pain.  Patient has known history of right shoulder arthritis.  Last injection was in September.  He has had prior heart surgery.  Was scheduled for shoulder replacement but his hemoglobin A1c was 12.  Most recently hemoglobin A1c 6.1.  Prior CT scan shows significant glenoid deformity which puts him in the reverse shoulder replacement category.  We have discussed in the past about the risk and benefits of surgery..                ROS: All systems reviewed are negative as they relate to the chief complaint within the history of present illness.  Patient denies fevers or chills.  Assessment & Plan: Visit Diagnoses:  1. Arthritis of right shoulder region     Plan: Impression is severe end-stage right shoulder arthritis with pain.  His passive range of motion is reasonably maintained.  Plan at this time is then PET CT scan for patient specific instrumentation.  We will post him for right reverse shoulder replacement.  The risk and benefits are discussed with the patient including not limited to infection nerve or vessel damage instability as well as incomplete pain relief and shoulder stiffness.  Patient understands risk benefits and wishes to proceed.  All questions answered.  Follow-Up Instructions: No follow-ups on file.   Orders:  Orders Placed This Encounter  Procedures   CT SHOULDER RIGHT WO CONTRAST   No orders of the defined types were placed in this encounter.     Procedures: No procedures performed   Clinical Data: No additional findings.  Objective: Vital Signs: There were no vitals  taken for this visit.  Physical Exam:  Constitutional: Patient appears well-developed HEENT:  Head: Normocephalic Eyes:EOM are normal Neck: Normal range of motion Cardiovascular: Normal rate Pulmonary/chest: Effort normal Neurologic: Patient is alert Skin: Skin is warm Psychiatric: Patient has normal mood and affect  Ortho Exam: Shoulder range of motion passive on the right 60/85/130.  Deltoid fires.  Has pretty good external rotation strength at 5 out of 5 as well as subscap strength 5+ out of 5 both shoulders.  Does have some coarseness with passive range of motion of the right shoulder which is mild.  Cervical spine range of motion intact.  Motor or sensory function to the hands intact.  Specialty Comments:  No specialty comments available.  Imaging: No results found.   PMFS History: Patient Active Problem List   Diagnosis Date Noted   Eustachian tube dysfunction, left 11/16/2023   Tinea versicolor 12/30/2022   Irregular heart beat 10/10/2022   Depression, major, single episode, mild (HCC) 04/02/2022   Nicotine dependence 12/31/2021   Other and unspecified peripheral vertigo 12/31/2021   Neuropathy 12/31/2021   Anemia 11/11/2021   Left hip pain 07/02/2021   Hearing loss 07/02/2021   Foot cramps 03/20/2021   Anxiety 12/11/2020   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   Multiple vessel coronary artery disease 11/20/2020   Bone spicules of jaw 12/27/2018   Adrenal adenoma 03/03/2017   Right  shoulder pain 03/03/2017   OSA (obstructive sleep apnea) 07/30/2016   Low back pain 05/15/2016   Actinic keratoses 02/07/2016   Right hip pain 02/07/2016   Ischemic cardiomyopathy    Uncontrolled type 2 diabetes mellitus with hyperglycemia, without long-term current use of insulin  (HCC) 11/30/2014   CAD s/p CABG (coronary artery disease) 04/12/2011   HTN (hypertension) 04/12/2011   Hyperlipemia 04/12/2011   Prostate cancer (HCC) 04/12/2011   Past Medical History:  Diagnosis Date   Actinic  keratoses    Angina    Asthma    BCC (basal cell carcinoma of skin) 09/06/2020   left upper back lateral (EDC) ,  left upper arm anterior (EDC), left upper back medial (EDC)    Bursitis of left elbow 2018   CAD (coronary artery disease)    Coronary artery disease of native artery of native heart with stable angina pectoris (HCC)    Diabetes mellitus without complication (HCC)    ED (erectile dysfunction)    Grade I diastolic dysfunction    History of heart artery stent    HTN (hypertension) 04/12/2011   Hyperlipemia 04/12/2011   Ischemic cardiomyopathy    a. echo 2010: EF 45-50%, mild to mod ant and apical wall HK, trace MR; b. cardiac cath 06/2012: EF 40%, mild MR    Multiple vessel coronary artery disease 04/12/2011   a. remote MI 1996; b. MI 1997 s/p PCI - LAD & diag; c. MI 2009: long stenting P-MLAD & Diag & POBA of jailed diag branch; d. cath 2010: 80% ISR of LAD w/ L-R collats, med Rx; e. cath 2012: 50-60% tub mLAD, 60-70% dLAD, FFR 0.75 -->s/p PCI dissection => w/ 2 stents (3 total); f. cath 06/2012: no changes from 2012 cath, med Rx, patent stents -> MV CAD 11/20/20 --> CABG X 5   Myocardial infarction (HCC)    x 5   Obesity    OSA (obstructive sleep apnea)    on CPAP   Osteoarthritis    Renal disorder    kidney stone   Uncontrolled type 2 diabetes mellitus with hyperglycemia, without long-term current use of insulin  (HCC)     Family History  Problem Relation Age of Onset   COPD Mother    Other Father        muscle tumor   Stroke Father    Prostate cancer Father        mets   Heart disease Father    Alcoholism Father    Stroke Sister    Heart block Sister        Heart bypass   Bladder Cancer Neg Hx    Kidney cancer Neg Hx     Past Surgical History:  Procedure Laterality Date   CORONARY ANGIOPLASTY     x 5   CORONARY ARTERY BYPASS GRAFT N/A 11/20/2020   Procedure: CORONARY ARTERY BYPASS GRAFTING (CABG) x  FIVE (LIMA-dLAD, SVG-PDA, SVG-D1, SeqSVG-OM1-2) ON PUMP   USING LEFT INTERNAL MAMMARY ARTERTY AND LEFT ENDOSCOPIC GREATER SAPHENOUS VEIN CONDUITS, RIGHT LEG OPENED NOT HARVESTED;  Surgeon: German Bartlett PEDLAR, MD;  Location: [facility] OR;  Service: Open Heart Surgery;  Laterality: N/A;   CORONARY PRESSURE/FFR STUDY N/A 11/15/2020   Procedure: INTRAVASCULAR PRESSURE WIRE/FFR STUDY;  Surgeon: Mady Bruckner, MD;  Location: [facility] INVASIVE CV LAB;  Service: Cardiovascular;  Laterality: N/A;   CORONARY STENT PLACEMENT     x 7   LEFT HEART CATH AND CORONARY ANGIOGRAPHY N/A 11/15/2020   Procedure: LEFT HEART CATH AND CORONARY  ANGIOGRAPHY;  Surgeon: Mady Bruckner, MD;  Location: [facility] INVASIVE CV LAB;  Service: Cardiovascular;  Laterality: N/A;   LEFT HEART CATHETERIZATION WITH CORONARY ANGIOGRAM N/A 04/14/2011   Procedure: LEFT HEART CATHETERIZATION WITH CORONARY ANGIOGRAM;  Surgeon: Vinie KYM Maxcy, MD;  Location: [facility] CATH LAB;  Service: Cardiovascular;  Laterality: N/A;   LEFT HEART CATHETERIZATION WITH CORONARY ANGIOGRAM N/A 06/25/2012   Procedure: LEFT HEART CATHETERIZATION WITH CORONARY ANGIOGRAM;  Surgeon: Peter M [place], MD;  Location: [facility] CATH LAB;  Service: Cardiovascular;  Laterality: N/A;   TEE WITHOUT CARDIOVERSION N/A 11/20/2020   Procedure: TRANSESOPHAGEAL ECHOCARDIOGRAM (TEE);  Surgeon: German Bartlett PEDLAR, MD;  Location: [facility] OR;  Service: Open Heart Surgery;  Laterality: N/A;   TONSILLECTOMY AND ADENOIDECTOMY  1955   Social History   Occupational History   Occupation: [employer]-retired   Occupation: Airline pilot  Tobacco Use   Smoking status: Former    Current packs/day: 0.00    Average packs/day: 1.5 packs/day for 40.0 years (60.0 ttl pk-yrs)    Types: Cigarettes    Start date: 09/14/1967    Quit date: 09/14/2007    Years since quitting: 16.2   Smokeless tobacco: Never   Tobacco comments:    quit 2009  Vaping Use   Vaping status: Never Used  Substance and Sexual Activity   Alcohol use: No    Alcohol/week: 0.0 standard drinks of alcohol    Comment: rarely 1  beer in last 5 years   Drug use: Not Currently    Types: Marijuana    Comment: pt stopped fall 2012   Sexual activity: Never

## 2023-12-21 ENCOUNTER — Ambulatory Visit
Admission: RE | Admit: 2023-12-21 | Discharge: 2023-12-21 | Disposition: A | Discharge status: Home / Self Care | Source: Ambulatory Visit | Attending: Orthopedic Surgery | Admitting: Orthopedic Surgery

## 2023-12-21 ENCOUNTER — Telehealth: Payer: Self-pay

## 2023-12-21 DIAGNOSIS — M19011 Primary osteoarthritis, right shoulder: Secondary | ICD-10-CM | POA: Diagnosis not present

## 2023-12-21 NOTE — Telephone Encounter (Signed)
 Sent message

## 2023-12-21 NOTE — Telephone Encounter (Signed)
   Pre-operative Risk Assessment    Patient Name: [name]  DOB: 09/04/1950 MRN: [id number]   Date of last office visit: 10/09/2023, [name], NP Date of next office visit: 01/15/2024, Dr. Deatrice Cage, MD   Request for Surgical Clearance    Procedure:  Reverse shoulder arthroplasty   Date of Surgery:  Clearance TBD                                Surgeon: Dr. JUDITHANN Glendia Hutchinson, MD Surgeon's Group or Practice Name: [facility] number: [phone] Fax number: [phone]   Type of Clearance Requested:   - Medical  - Pharmacy:  Hold Aspirin      Type of Anesthesia:  General    Additional requests/questions:    SignedAsberry KANDICE Dunning   12/21/2023, 3:52 PM

## 2023-12-21 NOTE — Progress Notes (Signed)
 Last read by Sharolyn Tanda Hoard at 8:20PM on 12/18/2023.

## 2023-12-22 ENCOUNTER — Ambulatory Visit: Payer: Self-pay | Admitting: Anesthesiology

## 2023-12-22 ENCOUNTER — Other Ambulatory Visit: Payer: Self-pay

## 2023-12-22 ENCOUNTER — Ambulatory Visit
Admission: RE | Admit: 2023-12-22 | Discharge: 2023-12-22 | Disposition: A | Discharge status: Home / Self Care | Attending: Ophthalmology | Admitting: Ophthalmology

## 2023-12-22 ENCOUNTER — Encounter
Admission: RE | Disposition: A | Discharge status: Home / Self Care | Payer: Self-pay | Source: Home / Self Care | Attending: Ophthalmology

## 2023-12-22 ENCOUNTER — Encounter: Payer: Self-pay | Admitting: Ophthalmology

## 2023-12-22 DIAGNOSIS — Z87891 Personal history of nicotine dependence: Secondary | ICD-10-CM | POA: Diagnosis not present

## 2023-12-22 DIAGNOSIS — Z7984 Long term (current) use of oral hypoglycemic drugs: Secondary | ICD-10-CM | POA: Insufficient documentation

## 2023-12-22 DIAGNOSIS — I10 Essential (primary) hypertension: Secondary | ICD-10-CM | POA: Diagnosis not present

## 2023-12-22 DIAGNOSIS — I25118 Atherosclerotic heart disease of native coronary artery with other forms of angina pectoris: Secondary | ICD-10-CM | POA: Insufficient documentation

## 2023-12-22 DIAGNOSIS — I252 Old myocardial infarction: Secondary | ICD-10-CM | POA: Insufficient documentation

## 2023-12-22 DIAGNOSIS — Z794 Long term (current) use of insulin: Secondary | ICD-10-CM | POA: Insufficient documentation

## 2023-12-22 DIAGNOSIS — E1165 Type 2 diabetes mellitus with hyperglycemia: Secondary | ICD-10-CM | POA: Diagnosis not present

## 2023-12-22 DIAGNOSIS — H2511 Age-related nuclear cataract, right eye: Secondary | ICD-10-CM | POA: Insufficient documentation

## 2023-12-22 DIAGNOSIS — G4733 Obstructive sleep apnea (adult) (pediatric): Secondary | ICD-10-CM | POA: Diagnosis not present

## 2023-12-22 DIAGNOSIS — Z955 Presence of coronary angioplasty implant and graft: Secondary | ICD-10-CM | POA: Insufficient documentation

## 2023-12-22 DIAGNOSIS — E1136 Type 2 diabetes mellitus with diabetic cataract: Secondary | ICD-10-CM | POA: Diagnosis not present

## 2023-12-22 DIAGNOSIS — I25119 Atherosclerotic heart disease of native coronary artery with unspecified angina pectoris: Secondary | ICD-10-CM | POA: Diagnosis not present

## 2023-12-22 DIAGNOSIS — Z7985 Long-term (current) use of injectable non-insulin antidiabetic drugs: Secondary | ICD-10-CM | POA: Diagnosis not present

## 2023-12-22 HISTORY — DX: Atherosclerotic heart disease of native coronary artery with other forms of angina pectoris: I25.118

## 2023-12-22 HISTORY — PX: CATARACT EXTRACTION W/PHACO: SHX586

## 2023-12-22 HISTORY — DX: Other ill-defined heart diseases: I51.89

## 2023-12-22 HISTORY — DX: Presence of coronary angioplasty implant and graft: Z95.5

## 2023-12-22 HISTORY — DX: Type 2 diabetes mellitus with hyperglycemia: E11.65

## 2023-12-22 LAB — GLUCOSE, CAPILLARY: Glucose-Capillary: 116 mg/dL — ABNORMAL HIGH (ref 70–99)

## 2023-12-22 SURGERY — PHACOEMULSIFICATION, CATARACT, WITH IOL INSERTION
Anesthesia: Monitor Anesthesia Care | Laterality: Right

## 2023-12-22 MED ORDER — SIGHTPATH DOSE#1 BSS IO SOLN
INTRAOCULAR | Status: DC | PRN
  Administered 2023-12-22 (×2): 15 mL via INTRAOCULAR

## 2023-12-22 MED ORDER — TETRACAINE HCL 0.5 % OP SOLN
OPHTHALMIC | Status: AC
  Filled 2023-12-22: qty 4

## 2023-12-22 MED ORDER — BRIMONIDINE TARTRATE-TIMOLOL 0.2-0.5 % OP SOLN
OPHTHALMIC | Status: DC | PRN
  Administered 2023-12-22 (×2): 1 [drp] via OPHTHALMIC

## 2023-12-22 MED ORDER — SIGHTPATH DOSE#1 NA CHONDROIT SULF-NA HYALURON 40-17 MG/ML IO SOLN
INTRAOCULAR | Status: DC | PRN
  Administered 2023-12-22 (×2): 1 mL via INTRAOCULAR

## 2023-12-22 MED ORDER — LACTATED RINGERS IV SOLN: INTRAVENOUS | Status: DC

## 2023-12-22 MED ORDER — MOXIFLOXACIN HCL 0.5 % OP SOLN
OPHTHALMIC | Status: DC | PRN
  Administered 2023-12-22 (×2): .2 mL via OPHTHALMIC

## 2023-12-22 MED ORDER — LIDOCAINE HCL (PF) 2 % IJ SOLN
INTRAOCULAR | Status: DC | PRN
  Administered 2023-12-22 (×2): 1 mL

## 2023-12-22 MED ORDER — ARMC OPHTHALMIC DILATING DROPS
1.0000 | OPHTHALMIC | Status: DC | PRN
  Administered 2023-12-22 (×6): 1 via OPHTHALMIC

## 2023-12-22 MED ORDER — TETRACAINE HCL 0.5 % OP SOLN
1.0000 [drp] | OPHTHALMIC | Status: DC | PRN
  Administered 2023-12-22 (×6): 1 [drp] via OPHTHALMIC

## 2023-12-22 MED ORDER — SIGHTPATH DOSE#1 BSS IO SOLN
INTRAOCULAR | Status: DC | PRN
  Administered 2023-12-22 (×2): 53 mL via OPHTHALMIC

## 2023-12-22 MED ORDER — DEXMEDETOMIDINE HCL IN NACL 200 MCG/50ML IV SOLN
INTRAVENOUS | Status: DC | PRN
  Administered 2023-12-22 (×2): 8 ug via INTRAVENOUS

## 2023-12-22 MED ORDER — MIDAZOLAM HCL 2 MG/2ML IJ SOLN
INTRAMUSCULAR | Status: AC
  Filled 2023-12-22: qty 2

## 2023-12-22 MED ORDER — MIDAZOLAM HCL 2 MG/2ML IJ SOLN
INTRAMUSCULAR | Status: DC | PRN
  Administered 2023-12-22 (×2): 2 mg via INTRAVENOUS

## 2023-12-22 MED ORDER — ARMC OPHTHALMIC DILATING DROPS
OPHTHALMIC | Status: AC
Start: 2023-12-22 — End: 2023-12-22
  Filled 2023-12-22: qty 0.5

## 2023-12-22 MED ORDER — FENTANYL CITRATE (PF) 100 MCG/2ML IJ SOLN: INTRAMUSCULAR | Status: DC | PRN

## 2023-12-22 SURGICAL SUPPLY — 9 items
CYSTOTOME ANGL RVRS SHRT 25G (CUTTER) ×1 IMPLANT
FEE CATARACT SUITE SIGHTPATH (MISCELLANEOUS) ×1 IMPLANT
GLOVE BIOGEL PI IND STRL 8 (GLOVE) ×1 IMPLANT
GLOVE SURG LX STRL 8.0 MICRO (GLOVE) ×1 IMPLANT
GLOVE SURG SYN 6.5 PF PI BL (GLOVE) ×1 IMPLANT
LENS IOL TECNIS EYHANCE 11.5 (Intraocular Lens) IMPLANT
NDL FILTER BLUNT 18X1 1/2 (NEEDLE) ×1 IMPLANT
NEEDLE FILTER BLUNT 18X1 1/2 (NEEDLE) ×1 IMPLANT
SYR 3ML LL SCALE MARK (SYRINGE) ×1 IMPLANT

## 2023-12-22 NOTE — Anesthesia Postprocedure Evaluation (Signed)
 Anesthesia Post Note  Patient: [name]  Procedure(s) Performed: PHACOEMULSIFICATION, CATARACT, WITH IOL INSERTION 7.83, 00:55.5 (Right)  Patient location during evaluation: PACU Anesthesia Type: MAC Level of consciousness: awake and alert Pain management: pain level controlled Vital Signs Assessment: post-procedure vital signs reviewed and stable Respiratory status: spontaneous breathing, nonlabored ventilation, respiratory function stable and patient connected to nasal cannula oxygen Cardiovascular status: stable and blood pressure returned to baseline Postop Assessment: no apparent nausea or vomiting Anesthetic complications: no   No notable events documented.   Last Vitals:  Vitals:   12/22/23 0821 12/22/23 0827  BP: 131/81 126/72  Pulse: 91 86  Resp: 20 19  Temp: (!) 36.4 C (!) 36.4 C  SpO2: 96% 96%    Last Pain:  Vitals:   12/22/23 0827  TempSrc:   PainSc: 0-No pain                 Janielle Mittelstadt C Lonney Revak

## 2023-12-22 NOTE — Transfer of Care (Signed)
 Immediate Anesthesia Transfer of Care Note  Patient: Theo Krumholz  Procedure(s) Performed: PHACOEMULSIFICATION, CATARACT, WITH IOL INSERTION 7.83, 00:55.5 (Right)  Patient Location: PACU  Anesthesia Type: MAC  Level of Consciousness: awake, alert  and patient cooperative  Airway and Oxygen Therapy: Patient Spontanous Breathing and Patient connected to supplemental oxygen  Post-op Assessment: Post-op Vital signs reviewed, Patient's Cardiovascular Status Stable, Respiratory Function Stable, Patent Airway and No signs of Nausea or vomiting  Post-op Vital Signs: Reviewed and stable  Complications: No notable events documented.

## 2023-12-22 NOTE — Telephone Encounter (Signed)
   Name: [name]  DOB: [birth year]  MRN: [id number]  Primary Cardiologist: Evalene Lunger, MD  Chart reviewed as part of pre-operative protocol coverage. The patient has an upcoming visit scheduled with Dr. Darron on 01/15/24 at which time clearance can be addressed in case there are any issues that would impact surgical recommendations.  Reverse shoulder arthroplasty is not scheduled as below. I added preop FYI to appointment note so that provider is aware to address at time of outpatient visit.  Per office protocol the cardiology provider should forward their finalized clearance decision and recommendations regarding antiplatelet therapy to the requesting party below.    Aspirin  hold can be addressed at OV with Dr. Darron.   I will route this message as FYI to requesting party and remove this message from the preop box as separate preop APP input not needed at this time.   Please call with any questions.  Reneisha Stilley D Yaqueline Gutter, NP  12/22/2023, 12:51 PM

## 2023-12-22 NOTE — Op Note (Signed)
 PREOPERATIVE DIAGNOSIS:  Nuclear sclerotic cataract of the right eye.   POSTOPERATIVE DIAGNOSIS:  Right Eye Cataract   OPERATIVE PROCEDURE:ORPROCALL@   SURGEON:  Elsie Carmine, MD.   ANESTHESIA:  Anesthesiologist: Ola Donny BROCKS, MD CRNA: Dave Maus, CRNA  1.      Managed anesthesia care. 2.      0.59ml of Shugarcaine was instilled in the eye following the paracentesis.   COMPLICATIONS:  None.   TECHNIQUE:   Stop and chop   DESCRIPTION OF PROCEDURE:  The patient was examined and consented in the preoperative holding area where the aforementioned topical anesthesia was applied to the right eye and then brought back to the Operating Room where the right eye was prepped and draped in the usual sterile ophthalmic fashion and a lid speculum was placed. A paracentesis was created with the side port blade and the anterior chamber was filled with viscoelastic. A near clear corneal incision was performed with the steel keratome. A continuous curvilinear capsulorrhexis was performed with a cystotome followed by the capsulorrhexis forceps. Hydrodissection and hydrodelineation were carried out with BSS on a blunt cannula. The lens was removed in a stop and chop  technique and the remaining cortical material was removed with the irrigation-aspiration handpiece. The capsular bag was inflated with viscoelastic and the Technis [id number]  lens was placed in the capsular bag without complication. The remaining viscoelastic was removed from the eye with the irrigation-aspiration handpiece. The wounds were hydrated. The anterior chamber was flushed with BSS and the eye was inflated to physiologic pressure. 0.36ml of Vigamox  was placed in the anterior chamber. The wounds were found to be water  tight. The eye was dressed with Combigan . The patient was given protective glasses to wear throughout the day and a shield with which to sleep tonight. The patient was also given drops with which to begin a drop regimen  today and will follow-up with me in one day. Implant Name Type Inv. Item Serial No. Manufacturer Lot No. LRB No. Used Action  LENS IOL TECNIS EYHANCE 11.5 - [id number] Intraocular Lens LENS IOL TECNIS EYHANCE 11.5 [id number] [employer]  Right 1 Implanted   Procedure(s): PHACOEMULSIFICATION, CATARACT, WITH IOL INSERTION 7.83, 00:55.5 (Right)  Electronically signed: Elsie Carmine 12/22/2023 8:20 AM

## 2023-12-22 NOTE — H&P (Signed)
 [facility]   Primary Care Physician:  Pcp, No Ophthalmologist: Dr. Ollie  Pre-Procedure History & Physical: HPI:  [name] is a 73 y.o. male here for cataract surgery.   Past Medical History:  Diagnosis Date   Actinic keratoses    Angina    Asthma    BCC (basal cell carcinoma of skin) 09/06/2020   left upper back lateral (EDC) ,  left upper arm anterior (EDC), left upper back medial (EDC)    Bursitis of left elbow 2018   CAD (coronary artery disease)    Coronary artery disease of native artery of native heart with stable angina pectoris (HCC)    Diabetes mellitus without complication [facility])    ED (erectile dysfunction)    Grade I diastolic dysfunction    Grade I diastolic dysfunction    History of heart artery stent    HTN (hypertension) 04/12/2011   Hyperlipemia 04/12/2011   Ischemic cardiomyopathy    a. echo 2010: EF 45-50%, mild to mod ant and apical wall HK, trace MR; b. cardiac cath 06/2012: EF 40%, mild MR    Multiple vessel coronary artery disease 04/12/2011   a. remote MI 1996; b. MI 1997 s/p PCI - LAD & diag; c. MI 2009: long stenting P-MLAD & Diag & POBA of jailed diag branch; d. cath 2010: 80% ISR of LAD w/ L-R collats, med Rx; e. cath 2012: 50-60% tub mLAD, 60-70% dLAD, FFR 0.75 -->s/p PCI dissection => w/ 2 stents (3 total); f. cath 06/2012: no changes from 2012 cath, med Rx, patent stents -> MV CAD 11/20/20 --> CABG X 5   Myocardial infarction (HCC)    x 5   Obesity    OSA (obstructive sleep apnea)    on CPAP   Osteoarthritis    Renal disorder    kidney stone   Uncontrolled type 2 diabetes mellitus with hyperglycemia, without long-term current use of insulin  (HCC)     Past Surgical History:  Procedure Laterality Date   CORONARY ANGIOPLASTY     x 5   CORONARY ARTERY BYPASS GRAFT N/A 11/20/2020   Procedure: CORONARY ARTERY BYPASS GRAFTING (CABG) x  FIVE (LIMA-dLAD, SVG-PDA, SVG-D1, SeqSVG-OM1-2) ON PUMP  USING LEFT INTERNAL MAMMARY ARTERTY  AND LEFT ENDOSCOPIC GREATER SAPHENOUS VEIN CONDUITS, RIGHT LEG OPENED NOT HARVESTED;  Surgeon: German Bartlett PEDLAR, MD;  Location: [facility] OR;  Service: Open Heart Surgery;  Laterality: N/A;   CORONARY PRESSURE/FFR STUDY N/A 11/15/2020   Procedure: INTRAVASCULAR PRESSURE WIRE/FFR STUDY;  Surgeon: Mady Bruckner, MD;  Location: [facility] INVASIVE CV LAB;  Service: Cardiovascular;  Laterality: N/A;   CORONARY STENT PLACEMENT     x 7   LEFT HEART CATH AND CORONARY ANGIOGRAPHY N/A 11/15/2020   Procedure: LEFT HEART CATH AND CORONARY ANGIOGRAPHY;  Surgeon: Mady Bruckner, MD;  Location: [facility] INVASIVE CV LAB;  Service: Cardiovascular;  Laterality: N/A;   LEFT HEART CATHETERIZATION WITH CORONARY ANGIOGRAM N/A 04/14/2011   Procedure: LEFT HEART CATHETERIZATION WITH CORONARY ANGIOGRAM;  Surgeon: Vinie KYM Maxcy, MD;  Location: [facility] CATH LAB;  Service: Cardiovascular;  Laterality: N/A;   LEFT HEART CATHETERIZATION WITH CORONARY ANGIOGRAM N/A 06/25/2012   Procedure: LEFT HEART CATHETERIZATION WITH CORONARY ANGIOGRAM;  Surgeon: Peter M Swaziland, MD;  Location[facility] C[facility] LAB;  Service: Cardiovascular;  Laterality: N/A;   TEE WITHOUT CARDIOVERSION N/A 11/20/2020   Procedure: TRANSESOPHAGEAL ECHOCARDIOGRAM (TEE);  Surgeon: German Bartlett PEDLAR, MD;  Location: [facility] OR;  Service: Open Heart Surgery;  Laterality: N/A;   TONSILLECTOMY AND  ADENOIDECTOMY  1955    Prior to Admission medications   Medication Sig Start Date End Date Taking? Authorizing Provider  acetaminophen  (TYLENOL ) 500 MG tablet Take 2 tablets (1,000 mg total) by mouth every 6 (six) hours as needed. 08/18/23 08/17/24 Yes Clarine Ozell LABOR, MD  aspirin  EC 325 MG EC tablet Take 1 tablet (325 mg total) by mouth daily. 11/23/20  Yes Dwan Aldo M, PA-C  atorvastatin  (LIPITOR ) 80 MG tablet Take 1 tablet (80 mg total) by mouth daily. 11/18/23  Yes Narendra, Nischal, MD  carvedilol  (COREG ) 6.25 MG tablet Take 1 tablet (6.25 mg total) by mouth 2 (two) times daily with a meal.  05/04/23  Yes Gollan, Timothy J, MD  diphenhydramine -acetaminophen  (TYLENOL  PM) 25-500 MG TABS tablet Take 1 tablet by mouth at bedtime as needed.   Yes [provider]  ezetimibe  (ZETIA ) 10 MG tablet TAKE 1 TABLET DAILY 08/20/23  Yes Gollan, Timothy J, MD  furosemide  (LASIX ) 40 MG tablet Take 1 tablet (40 mg total) by mouth daily. 05/04/23  Yes Gollan, Timothy J, MD  isosorbide  mononitrate (IMDUR ) 30 MG 24 hr tablet Take 1 tablet (30 mg total) by mouth daily. 05/04/23  Yes Gollan, Timothy J, MD  losartan  (COZAAR ) 25 MG tablet Take 1 tablet (25 mg total) by mouth daily. 05/04/23  Yes Gollan, Timothy J, MD  Magnesium  250 MG TABS Take 1 tablet by mouth daily.   Yes [provider]  metFORMIN  (GLUCOPHAGE ) 1000 MG tablet Take 1 tablet (1,000 mg total) by mouth 2 (two) times daily with a meal. 11/16/23  Yes Onesimo Claude, MD  Multiple Vitamins-Minerals (MULTIVITAMINS THER. W/MINERALS) TABS Take 1 tablet by mouth every morning.   Yes [provider]  niacin  (NIASPAN ) 1000 MG CR tablet Take 2 tablets (2000 mg) by mouth once daily at bedtime 11/25/23  Yes Gollan, Timothy J, MD  potassium chloride  SA (KLOR-CON  M) 20 MEQ tablet Take 1 tablet (20 mEq total) by mouth daily. 05/04/23  Yes Gollan, Timothy J, MD  Semaglutide , 2 MG/DOSE, 8 MG/3ML SOPN Inject 2 mg as directed once a week. 11/16/23  Yes Onesimo Claude, MD  blood glucose meter kit and supplies KIT Dispense based on patient and insurance preference. Use up to four times daily as directed. 10/25/21   Bryn Bernardino NOVAK, MD  insulin  glargine (LANTUS ) 100 UNIT/ML Solostar Pen Inject 25 Units into the skin daily. 11/16/23   Narendra, Nischal, MD  Insulin  Pen Needle 31G X 5 MM MISC Inject insulin  via insulin  pen daily 10/25/21   Bryn Bernardino NOVAK, MD  ketoconazole  (NIZORAL ) 2 % cream Apply twice daily to pink, scaly areas on face, chest, arms until improved. 12/30/22   Claudene Lehmann, MD  nitroGLYCERIN  (NITROLINGUAL ) 0.4 MG/SPRAY spray  Place 1 spray under the tongue every 5 (five) minutes x 3 doses as needed for chest pain. 05/04/23   Gollan, Timothy J, MD  NON FORMULARY Take 1-2 tablets by mouth daily as needed (Pain). CBD Gummy Patient not taking: Reported on 12/08/2023    [provider]    Allergies as of 11/30/2023 - Review Complete 11/16/2023  Allergen Reaction Noted   Hydroxyzine hcl Other (See Comments) 04/12/2011   Latex Rash and Other (See Comments) 01/31/2012    Family History  Problem Relation Age of Onset   COPD Mother    Other Father        muscle tumor   Stroke Father    Prostate cancer Father        mets  Heart disease Father    Alcoholism Father    Stroke Sister    Heart block Sister        Heart bypass   Bladder Cancer Neg Hx    Kidney cancer Neg Hx     Social History   Socioeconomic History   Marital status: Married    Spouse name: Rojelio   Number of children: 2   Years of education: Not on file   Highest education level: Not on file  Occupational History   Occupation: [employer]-retired   Occupation: Airline pilot  Tobacco Use   Smoking status: Former    Current packs/day: 0.00    Average packs/day: 1.5 packs/day for 40.0 years (60.0 ttl pk-yrs)    Types: Cigarettes    Start date: 09/14/1967    Quit date: 09/14/2007    Years since quitting: 16.2   Smokeless tobacco: Never   Tobacco comments:    quit 2009  Vaping Use   Vaping status: Never Used  Substance and Sexual Activity   Alcohol use: No    Alcohol/week: 0.0 standard drinks of alcohol    Comment: rarely 1 beer in last 5 years   Drug use: Not Currently    Types: Marijuana    Comment: pt stopped fall 2012   Sexual activity: Never  Other Topics Concern   Not on file  Social History Narrative   Retired twice    Married to [place], has 2 children from previous marriage   Pet: 3 dogs    Social Drivers of Corporate investment banker Strain: Low Risk  (12/25/2022)   Overall Financial Resource Strain (CARDIA)     Difficulty of Paying Living Expenses: Not very hard  Food Insecurity: No Food Insecurity (10/30/2023)   Hunger Vital Sign    Worried About Running Out of Food in the Last Year: Never true    Ran Out of Food in the Last Year: Never true  Transportation Needs: No Transportation Needs (10/30/2023)   PRAPARE - Administrator, Civil Service (Medical): No    Lack of Transportation (Non-Medical): No  Physical Activity: Sufficiently Active (12/25/2022)   Exercise Vital Sign    Days of Exercise per Week: 7 days    Minutes of Exercise per Session: 30 min  Stress: No Stress Concern Present (12/25/2022)   [employer] of Occupational Health - Occupational Stress Questionnaire    Feeling of Stress : Not at all  Social Connections: Moderately Integrated (12/25/2022)   Social Connection and Isolation Panel    Frequency of Communication with Friends and Family: Twice a week    Frequency of Social Gatherings with Friends and Family: More than three times a week    Attends Religious Services: Never    Database administrator or Organizations: Yes    Attends Banker Meetings: Never    Marital Status: Married  Catering manager Violence: Not At Risk (10/30/2023)   Humiliation, Afraid, Rape, and Kick questionnaire    Fear of Current or Ex-Partner: No    Emotionally Abused: No    Physically Abused: No    Sexually Abused: No    Review of Systems: See HPI, otherwise negative ROS  Physical Exam: BP 139/81   Temp 98 F (36.7 C) (Temporal)   Resp 16   Ht 5' 7.99 (1.727 m)   Wt 87.1 kg   SpO2 97%   BMI 29.20 kg/m  General:   Alert, cooperative. Head:  Normocephalic and atraumatic. Respiratory:  Normal  work of breathing. Cardiovascular:  NAD  Impression/Plan: [name] is here for cataract surgery.  Risks, benefits, limitations, and alternatives regarding cataract surgery have been reviewed with the patient.  Questions have been answered.  All parties  agreeable.   Elsie Carmine, MD  12/22/2023, 7:54 AM

## 2023-12-23 DIAGNOSIS — H2512 Age-related nuclear cataract, left eye: Secondary | ICD-10-CM | POA: Diagnosis not present

## 2023-12-23 NOTE — Anesthesia Preprocedure Evaluation (Addendum)
 Anesthesia Evaluation  Patient identified by MRN, date of birth, ID band Patient awake    Reviewed: Allergy & Precautions, H&P , NPO status , Patient's Chart, lab work & pertinent test results, reviewed documented beta blocker date and time   Airway Mallampati: II  TM Distance: >3 FB Neck ROM: full    Dental no notable dental hx. (+) Teeth Intact   Pulmonary asthma , sleep apnea , former smoker   Pulmonary exam normal breath sounds clear to auscultation       Cardiovascular Exercise Tolerance: Good hypertension, On Medications + angina with exertion + CAD and + Past MI   Rhythm:regular Rate:Normal     Neuro/Psych  PSYCHIATRIC DISORDERS Anxiety Depression    negative neurological ROS     GI/Hepatic negative GI ROS, Neg liver ROS,,,  Endo/Other  negative endocrine ROSdiabetes    Renal/GU Renal disease     Musculoskeletal   Abdominal   Peds  Hematology  (+) Blood dyscrasia, anemia   Anesthesia Other Findings   Reproductive/Obstetrics negative OB ROS                              Anesthesia Physical Anesthesia Plan  ASA: 2  Anesthesia Plan: MAC   Post-op Pain Management:    Induction:   PONV Risk Score and Plan:   Airway Management Planned:   Additional Equipment:   Intra-op Plan:   Post-operative Plan:   Informed Consent: I have reviewed the patients History and Physical, chart, labs and discussed the procedure including the risks, benefits and alternatives for the proposed anesthesia with the patient or authorized representative who has indicated his/her understanding and acceptance.       Plan Discussed with: CRNA  Anesthesia Plan Comments:         Anesthesia Quick Evaluation

## 2023-12-29 ENCOUNTER — Ambulatory Visit: Payer: Self-pay | Admitting: Orthopedic Surgery

## 2023-12-29 NOTE — Progress Notes (Signed)
 Hi [name] CT scan done do you need a surgery sheet?  He does have cardiology appointment on the fifth.

## 2023-12-31 NOTE — Progress Notes (Signed)
 thx

## 2024-01-04 NOTE — Discharge Instructions (Signed)

## 2024-01-05 ENCOUNTER — Ambulatory Visit: Payer: Self-pay | Admitting: Anesthesiology

## 2024-01-05 ENCOUNTER — Ambulatory Visit
Admission: RE | Admit: 2024-01-05 | Discharge: 2024-01-05 | Disposition: A | Discharge status: Home / Self Care | Attending: Ophthalmology | Admitting: Ophthalmology

## 2024-01-05 ENCOUNTER — Encounter: Payer: Self-pay | Admitting: Ophthalmology

## 2024-01-05 ENCOUNTER — Encounter
Admission: RE | Disposition: A | Discharge status: Home / Self Care | Payer: Self-pay | Source: Home / Self Care | Attending: Ophthalmology

## 2024-01-05 ENCOUNTER — Other Ambulatory Visit: Payer: Self-pay

## 2024-01-05 DIAGNOSIS — E1136 Type 2 diabetes mellitus with diabetic cataract: Secondary | ICD-10-CM | POA: Insufficient documentation

## 2024-01-05 DIAGNOSIS — G4733 Obstructive sleep apnea (adult) (pediatric): Secondary | ICD-10-CM | POA: Insufficient documentation

## 2024-01-05 DIAGNOSIS — Z7985 Long-term (current) use of injectable non-insulin antidiabetic drugs: Secondary | ICD-10-CM | POA: Diagnosis not present

## 2024-01-05 DIAGNOSIS — H2512 Age-related nuclear cataract, left eye: Secondary | ICD-10-CM | POA: Insufficient documentation

## 2024-01-05 DIAGNOSIS — Z794 Long term (current) use of insulin: Secondary | ICD-10-CM | POA: Insufficient documentation

## 2024-01-05 DIAGNOSIS — I252 Old myocardial infarction: Secondary | ICD-10-CM | POA: Insufficient documentation

## 2024-01-05 DIAGNOSIS — Z8249 Family history of ischemic heart disease and other diseases of the circulatory system: Secondary | ICD-10-CM | POA: Diagnosis not present

## 2024-01-05 DIAGNOSIS — Z87891 Personal history of nicotine dependence: Secondary | ICD-10-CM | POA: Insufficient documentation

## 2024-01-05 DIAGNOSIS — J45909 Unspecified asthma, uncomplicated: Secondary | ICD-10-CM | POA: Insufficient documentation

## 2024-01-05 DIAGNOSIS — N289 Disorder of kidney and ureter, unspecified: Secondary | ICD-10-CM | POA: Insufficient documentation

## 2024-01-05 DIAGNOSIS — I2510 Atherosclerotic heart disease of native coronary artery without angina pectoris: Secondary | ICD-10-CM | POA: Insufficient documentation

## 2024-01-05 DIAGNOSIS — I10 Essential (primary) hypertension: Secondary | ICD-10-CM | POA: Insufficient documentation

## 2024-01-05 DIAGNOSIS — Z7984 Long term (current) use of oral hypoglycemic drugs: Secondary | ICD-10-CM | POA: Insufficient documentation

## 2024-01-05 HISTORY — PX: CATARACT EXTRACTION W/PHACO: SHX586

## 2024-01-05 LAB — GLUCOSE, CAPILLARY: Glucose-Capillary: 137 mg/dL — ABNORMAL HIGH (ref 70–99)

## 2024-01-05 SURGERY — PHACOEMULSIFICATION, CATARACT, WITH IOL INSERTION
Anesthesia: Monitor Anesthesia Care | Site: Eye | Laterality: Left

## 2024-01-05 MED ORDER — EPINEPHRINE PF 1 MG/ML IJ SOLN
INTRAMUSCULAR | Status: DC | PRN
  Administered 2024-01-05: 71 mL via OPHTHALMIC

## 2024-01-05 MED ORDER — DROPERIDOL 2.5 MG/ML IJ SOLN: 0.6250 mg | Freq: Once | INTRAMUSCULAR | Status: DC | PRN

## 2024-01-05 MED ORDER — ACETAMINOPHEN 10 MG/ML IV SOLN: 1000.0000 mg | Freq: Once | INTRAVENOUS | Status: DC | PRN

## 2024-01-05 MED ORDER — FENTANYL CITRATE PF 50 MCG/ML IJ SOSY: 25.0000 ug | PREFILLED_SYRINGE | INTRAMUSCULAR | Status: DC | PRN

## 2024-01-05 MED ORDER — TETRACAINE HCL 0.5 % OP SOLN
1.0000 [drp] | OPHTHALMIC | Status: DC | PRN
  Administered 2024-01-05 (×3): 1 [drp] via OPHTHALMIC

## 2024-01-05 MED ORDER — MOXIFLOXACIN HCL 0.5 % OP SOLN
OPHTHALMIC | Status: DC | PRN
  Administered 2024-01-05: .2 mL via OPHTHALMIC

## 2024-01-05 MED ORDER — OXYCODONE HCL 5 MG/5ML PO SOLN: 5.0000 mg | Freq: Once | ORAL | Status: DC | PRN

## 2024-01-05 MED ORDER — FENTANYL CITRATE (PF) 100 MCG/2ML IJ SOLN
INTRAMUSCULAR | Status: AC
  Filled 2024-01-05: qty 2

## 2024-01-05 MED ORDER — LIDOCAINE HCL (PF) 2 % IJ SOLN
INTRAOCULAR | Status: DC | PRN
  Administered 2024-01-05: 2 mL

## 2024-01-05 MED ORDER — FENTANYL CITRATE (PF) 100 MCG/2ML IJ SOLN
INTRAMUSCULAR | Status: DC | PRN
  Administered 2024-01-05: 50 ug via INTRAVENOUS

## 2024-01-05 MED ORDER — TETRACAINE HCL 0.5 % OP SOLN
OPHTHALMIC | Status: AC
  Filled 2024-01-05: qty 4

## 2024-01-05 MED ORDER — MIDAZOLAM HCL 2 MG/2ML IJ SOLN
INTRAMUSCULAR | Status: AC
  Filled 2024-01-05: qty 2

## 2024-01-05 MED ORDER — ARMC OPHTHALMIC DILATING DROPS
1.0000 | OPHTHALMIC | Status: DC | PRN
  Administered 2024-01-05 (×3): 1 via OPHTHALMIC

## 2024-01-05 MED ORDER — LACTATED RINGERS IV SOLN: INTRAVENOUS | Status: DC

## 2024-01-05 MED ORDER — SIGHTPATH DOSE#1 BSS IO SOLN
INTRAOCULAR | Status: DC | PRN
  Administered 2024-01-05: 15 mL via INTRAOCULAR

## 2024-01-05 MED ORDER — OXYCODONE HCL 5 MG PO TABS: 5.0000 mg | ORAL_TABLET | Freq: Once | ORAL | Status: DC | PRN

## 2024-01-05 MED ORDER — MIDAZOLAM HCL 2 MG/2ML IJ SOLN
INTRAMUSCULAR | Status: DC | PRN
  Administered 2024-01-05: 2 mg via INTRAVENOUS

## 2024-01-05 MED ORDER — SIGHTPATH DOSE#1 NA CHONDROIT SULF-NA HYALURON 40-17 MG/ML IO SOLN
INTRAOCULAR | Status: DC | PRN
Start: 2024-01-05 — End: 2024-01-05
  Administered 2024-01-05: 1 mL via INTRAOCULAR

## 2024-01-05 MED ORDER — BRIMONIDINE TARTRATE-TIMOLOL 0.2-0.5 % OP SOLN
OPHTHALMIC | Status: DC | PRN
Start: 2024-01-05 — End: 2024-01-05
  Administered 2024-01-05: 1 [drp] via OPHTHALMIC

## 2024-01-05 MED ORDER — ARMC OPHTHALMIC DILATING DROPS
OPHTHALMIC | Status: AC
  Filled 2024-01-05: qty 0.5

## 2024-01-05 SURGICAL SUPPLY — 9 items
CYSTOTOME ANGL RVRS SHRT 25G (CUTTER) ×1 IMPLANT
FEE CATARACT SUITE SIGHTPATH (MISCELLANEOUS) ×1 IMPLANT
GLOVE BIOGEL PI IND STRL 8 (GLOVE) ×1 IMPLANT
GLOVE SURG SYN 6.5 PF PI BL (GLOVE) ×1 IMPLANT
GLOVE SURG SYN 8.0 PF PI (GLOVE) IMPLANT
LENS IOL TECNIS EYHANCE 10.5 (Intraocular Lens) IMPLANT
NDL FILTER BLUNT 18X1 1/2 (NEEDLE) ×1 IMPLANT
NEEDLE FILTER BLUNT 18X1 1/2 (NEEDLE) ×1 IMPLANT
SYR 3ML LL SCALE MARK (SYRINGE) ×1 IMPLANT

## 2024-01-05 NOTE — H&P (Signed)
 [facility]   Primary Care Physician:  Pccm, Fenton, MD Ophthalmologist: Dr. Jaye  Pre-Procedure History & Physical: HPI:  [name] is a 73 y.o. male here for cataract surgery.   Past Medical History:  Diagnosis Date   Actinic keratoses    Angina    Asthma    BCC (basal cell carcinoma of skin) 09/06/2020   left upper back lateral (EDC) ,  left upper arm anterior (EDC), left upper back medial (EDC)    Bursitis of left elbow 2018   CAD (coronary artery disease)    Coronary artery disease of native artery of native heart with stable angina pectoris (HCC)    Diabetes mellitus without complication [facility])    ED (erectile dysfunction)    Grade I diastolic dysfunction    Grade I diastolic dysfunction    History of heart artery stent    HTN (hypertension) 04/12/2011   Hyperlipemia 04/12/2011   Ischemic cardiomyopathy    a. echo 2010: EF 45-50%, mild to mod ant and apical wall HK, trace MR; b. cardiac cath 06/2012: EF 40%, mild MR    Multiple vessel coronary artery disease 04/12/2011   a. remote MI 1996; b. MI 1997 s/p PCI - LAD & diag; c. MI 2009: long stenting P-MLAD & Diag & POBA of jailed diag branch; d. cath 2010: 80% ISR of LAD w/ L-R collats, med Rx; e. cath 2012: 50-60% tub mLAD, 60-70% dLAD, FFR 0.75 -->s/p PCI dissection => w/ 2 stents (3 total); f. cath 06/2012: no changes from 2012 cath, med Rx, patent stents -> MV CAD 11/20/20 --> CABG X 5   Myocardial infarction (HCC)    x 5   Obesity    OSA (obstructive sleep apnea)    on CPAP   Osteoarthritis    Renal disorder    kidney stone   Uncontrolled type 2 diabetes mellitus with hyperglycemia, without long-term current use of insulin  [facility])     Past Surgical History:  Procedure Laterality Date   CATARACT EXTRACTION W/PHACO Right 12/22/2023   Procedure: PHACOEMULSIFICATION, CATARACT, WITH IOL INSERTION 7.83, 00:55.5;  Surgeon: Jaye Fallow, MD;  Location: [facility];  Service:  Ophthalmology;  Laterality: Right;   CORONARY ANGIOPLASTY     x 5   CORONARY ARTERY BYPASS GRAFT N/A 11/20/2020   Procedure: CORONARY ARTERY BYPASS GRAFTING (CABG) x  FIVE (LIMA-dLAD, SVG-PDA, SVG-D1, SeqSVG-OM1-2) ON PUMP  USING LEFT INTERNAL MAMMARY ARTERTY AND LEFT ENDOSCOPIC GREATER SAPHENOUS VEIN CONDUITS, RIGHT LEG OPENED NOT HARVESTED;  Surgeon: German Bartlett PEDLAR, MD;  Location: [facility] OR;  Service: Open Heart Surgery;  Laterality: N/A;   CORONARY PRESSURE/FFR STUDY N/A 11/15/2020   Procedure: INTRAVASCULAR PRESSURE WIRE/FFR STUDY;  Surgeon: Mady Bruckner, MD;  Location: [facility] INVASIVE CV LAB;  Service: Cardiovascular;  Laterality: N/A;   CORONARY STENT PLACEMENT     x 7   LEFT HEART CATH AND CORONARY ANGIOGRAPHY N/A 11/15/2020   Procedure: LEFT HEART CATH AND CORONARY ANGIOGRAPHY;  Surgeon: Mady Bruckner, MD;  Location: [facility] INVASIVE CV LAB;  Service: Cardiovascular;  Laterality: N/A;   LEFT HEART CATHETERIZATION WITH CORONARY ANGIOGRAM N/A 04/14/2011   Procedure: LEFT HEART CATHETERIZATION WITH CORONARY ANGIOGRAM;  Surgeon: Vinie KYM Maxcy, MD;  Location: [facility] CATH LAB;  Service: Cardiovascular;  Laterality: N/A;   LEFT HEART CATHETERIZATION WITH CORONARY ANGIOGRAM N/A 06/25/2012   Procedure: LEFT HEART CATHETERIZATION WITH CORONARY ANGIOGRAM;  Surgeon: Peter M Swaziland, MD;  Location: [facility] CATH LAB;  Service: Cardiovascular;  Laterality: N/A;  TEE WITHOUT CARDIOVERSION N/A 11/20/2020   Procedure: TRANSESOPHAGEAL ECHOCARDIOGRAM (TEE);  Surgeon: German Bartlett PEDLAR, MD;  Location: [facility] OR;  Service: Open Heart Surgery;  Laterality: N/A;   TONSILLECTOMY AND ADENOIDECTOMY  1955    Prior to Admission medications   Medication Sig Start Date End Date Taking? Authorizing Provider  aspirin  EC 325 MG EC tablet Take 1 tablet (325 mg total) by mouth daily. 11/23/20  Yes Dwan Aldo M, PA-C  atorvastatin  (LIPITOR ) 80 MG tablet Take 1 tablet (80 mg total) by mouth daily. 11/18/23  Yes Narendra, Nischal,  MD  carvedilol  (COREG ) 6.25 MG tablet Take 1 tablet (6.25 mg total) by mouth 2 (two) times daily with a meal. 05/04/23  Yes Gollan, Timothy J, MD  diphenhydramine -acetaminophen  (TYLENOL  PM) 25-500 MG TABS tablet Take 1 tablet by mouth at bedtime as needed.   Yes [provider]  ezetimibe  (ZETIA ) 10 MG tablet TAKE 1 TABLET DAILY 08/20/23  Yes Gollan, Timothy J, MD  furosemide  (LASIX ) 40 MG tablet Take 1 tablet (40 mg total) by mouth daily. 05/04/23  Yes Gollan, Timothy J, MD  insulin  glargine (LANTUS ) 100 UNIT/ML Solostar Pen Inject 25 Units into the skin daily. 11/16/23  Yes Narendra, Nischal, MD  isosorbide  mononitrate (IMDUR ) 30 MG 24 hr tablet Take 1 tablet (30 mg total) by mouth daily. 05/04/23  Yes Gollan, Timothy J, MD  losartan  (COZAAR ) 25 MG tablet Take 1 tablet (25 mg total) by mouth daily. 05/04/23  Yes Gollan, Timothy J, MD  Magnesium  250 MG TABS Take 1 tablet by mouth daily.   Yes [provider]  metFORMIN  (GLUCOPHAGE ) 1000 MG tablet Take 1 tablet (1,000 mg total) by mouth 2 (two) times daily with a meal. 11/16/23  Yes Onesimo Claude, MD  Multiple Vitamins-Minerals (MULTIVITAMINS THER. W/MINERALS) TABS Take 1 tablet by mouth every morning.   Yes [provider]  niacin  (NIASPAN ) 1000 MG CR tablet Take 2 tablets (2000 mg) by mouth once daily at bedtime 11/25/23  Yes Gollan, Evalene PARAS, MD  NON FORMULARY Take 1-2 tablets by mouth daily as needed (Pain). CBD Gummy   Yes [provider]  potassium chloride  SA (KLOR-CON  M) 20 MEQ tablet Take 1 tablet (20 mEq total) by mouth daily. 05/04/23  Yes Gollan, Timothy J, MD  Semaglutide , 2 MG/DOSE, 8 MG/3ML SOPN Inject 2 mg as directed once a week. 11/16/23  Yes Narendra, Nischal, MD  acetaminophen  (TYLENOL ) 500 MG tablet Take 2 tablets (1,000 mg total) by mouth every 6 (six) hours as needed. 08/18/23 08/17/24  Clarine Ozell LABOR, MD  blood glucose meter kit and supplies KIT Dispense based on patient and insurance preference.  Use up to four times daily as directed. 10/25/21   Bryn Bernardino NOVAK, MD  Insulin  Pen Needle 31G X 5 MM MISC Inject insulin  via insulin  pen daily 10/25/21   Bryn Bernardino NOVAK, MD  ketoconazole  (NIZORAL ) 2 % cream Apply twice daily to pink, scaly areas on face, chest, arms until improved. 12/30/22   Claudene Lehmann, MD  nitroGLYCERIN  (NITROLINGUAL ) 0.4 MG/SPRAY spray Place 1 spray under the tongue every 5 (five) minutes x 3 doses as needed for chest pain. 05/04/23   Gollan, Timothy J, MD    Allergies as of 11/30/2023 - Review Complete 11/16/2023  Allergen Reaction Noted   Hydroxyzine hcl Other (See Comments) 04/12/2011   Latex Rash and Other (See Comments) 01/31/2012    Family History  Problem Relation Age of Onset   COPD Mother    Other Father  muscle tumor   Stroke Father    Prostate cancer Father        mets   Heart disease Father    Alcoholism Father    Stroke Sister    Heart block Sister        Heart bypass   Bladder Cancer Neg Hx    Kidney cancer Neg Hx     Social History   Socioeconomic History   Marital status: Married    Spouse name: Rojelio   Number of children: 2   Years of education: Not on file   Highest education level: Not on file  Occupational History   Occupation: [employer]-retired   Occupation: Airline pilot  Tobacco Use   Smoking status: Former    Current packs/day: 0.00    Average packs/day: 1.5 packs/day for 40.0 years (60.0 ttl pk-yrs)    Types: Cigarettes    Start date: 09/14/1967    Quit date: 09/14/2007    Years since quitting: 16.3   Smokeless tobacco: Never   Tobacco comments:    quit 2009  Vaping Use   Vaping status: Never Used  Substance and Sexual Activity   Alcohol use: No    Alcohol/week: 0.0 standard drinks of alcohol    Comment: rarely 1 beer in last 5 years   Drug use: Not Currently    Types: Marijuana    Comment: pt stopped fall 2012   Sexual activity: Never  Other Topics Concern   Not on file  Social History Narrative   Retired twice     Married to [place], has 2 children from previous marriage   Pet: 3 dogs    Social Drivers of Corporate investment banker Strain: Low Risk  (12/25/2022)   Overall Financial Resource Strain (CARDIA)    Difficulty of Paying Living Expenses: Not very hard  Food Insecurity: No Food Insecurity (10/30/2023)   Hunger Vital Sign    Worried About Running Out of Food in the Last Year: Never true    Ran Out of Food in the Last Year: Never true  Transportation Needs: No Transportation Needs (10/30/2023)   PRAPARE - Administrator, Civil Service (Medical): No    Lack of Transportation (Non-Medical): No  Physical Activity: Sufficiently Active (12/25/2022)   Exercise Vital Sign    Days of Exercise per Week: 7 days    Minutes of Exercise per Session: 30 min  Stress: No Stress Concern Present (12/25/2022)   [employer] of Occupational Health - Occupational Stress Questionnaire    Feeling of Stress : Not at all  Social Connections: Moderately Integrated (12/25/2022)   Social Connection and Isolation Panel    Frequency of Communication with Friends and Family: Twice a week    Frequency of Social Gatherings with Friends and Family: More than three times a week    Attends Religious Services: Never    Database administrator or Organizations: Yes    Attends Banker Meetings: Never    Marital Status: Married  Catering manager Violence: Not At Risk (10/30/2023)   Humiliation, Afraid, Rape, and Kick questionnaire    Fear of Current or Ex-Partner: No    Emotionally Abused: No    Physically Abused: No    Sexually Abused: No    Review of Systems: See HPI, otherwise negative ROS  Physical Exam: BP 131/79   Temp (!) 97.5 F (36.4 C) (Temporal)   Resp 19   Ht 5' 7.99 (1.727 m)   Wt 87 kg  SpO2 98%   BMI 29.15 kg/m  General:   Alert, cooperative. Head:  Normocephalic and atraumatic. Respiratory:  Normal work of breathing. Cardiovascular:   NAD  Impression/Plan: [name] is here for cataract surgery.  Risks, benefits, limitations, and alternatives regarding cataract surgery have been reviewed with the patient.  Questions have been answered.  All parties agreeable.   Elsie Carmine, MD  01/05/2024, 9:03 AM

## 2024-01-05 NOTE — Op Note (Signed)
 PREOPERATIVE DIAGNOSIS:  Nuclear sclerotic cataract of the left eye.   POSTOPERATIVE DIAGNOSIS:  Nuclear sclerotic cataract of the left eye.   OPERATIVE PROCEDURE:ORPROCALL@   SURGEON:  Elsie Carmine, MD.   ANESTHESIA:  Anesthesiologist: Myra Lynwood MATSU, MD CRNA: Veronica Alm BROCKS, CRNA  1.      Managed anesthesia care. 2.     0.70ml of Shugarcaine was instilled following the paracentesis   COMPLICATIONS:  None.   TECHNIQUE:   Stop and chop   DESCRIPTION OF PROCEDURE:  The patient was examined and consented in the preoperative holding area where the aforementioned topical anesthesia was applied to the left eye and then brought back to the Operating Room where the left eye was prepped and draped in the usual sterile ophthalmic fashion and a lid speculum was placed. A paracentesis was created with the side port blade and the anterior chamber was filled with viscoelastic. A near clear corneal incision was performed with the steel keratome. A continuous curvilinear capsulorrhexis was performed with a cystotome followed by the capsulorrhexis forceps. Hydrodissection and hydrodelineation were carried out with BSS on a blunt cannula. The lens was removed in a stop and chop  technique and the remaining cortical material was removed with the irrigation-aspiration handpiece. The capsular bag was inflated with viscoelastic and the Technis [id number] lens was placed in the capsular bag without complication. The remaining viscoelastic was removed from the eye with the irrigation-aspiration handpiece. The wounds were hydrated. The anterior chamber was flushed with BSS and the eye was inflated to physiologic pressure. 0.30ml Vigamox  was placed in the anterior chamber. The wounds were found to be water  tight. The eye was dressed with Combigan . The patient was given protective glasses to wear throughout the day and a shield with which to sleep tonight. The patient was also given drops with which to begin a drop regimen  today and will follow-up with me in one day. Implant Name Type Inv. Item Serial No. Manufacturer Lot No. LRB No. Used Action  LENS IOL TECNIS EYHANCE 10.5 - [id number] Intraocular Lens LENS IOL TECNIS EYHANCE 10.5 [id number] [employer]  Left 1 Implanted    Procedure(s): PHACOEMULSIFICATION, CATARACT, WITH IOL INSERTION 8.65 01:06.1 (Left)  Electronically signed: Elsie Carmine 01/05/2024 9:28 AM

## 2024-01-05 NOTE — Transfer of Care (Signed)
 Immediate Anesthesia Transfer of Care Note  Patient: [name]  Procedure(s) Performed: PHACOEMULSIFICATION, CATARACT, WITH IOL INSERTION 8.65 01:06.1 (Left: Eye)  Patient Location: PACU  Anesthesia Type: MAC  Level of Consciousness: awake, alert  and patient cooperative  Airway and Oxygen Therapy: Patient Spontanous Breathing and Patient connected to supplemental oxygen  Post-op Assessment: Post-op Vital signs reviewed, Patient's Cardiovascular Status Stable, Respiratory Function Stable, Patent Airway and No signs of Nausea or vomiting  Post-op Vital Signs: Reviewed and stable  Complications: No notable events documented.

## 2024-01-05 NOTE — Anesthesia Postprocedure Evaluation (Signed)
 Anesthesia Post Note  Patient: [name]  Procedure(s) Performed: PHACOEMULSIFICATION, CATARACT, WITH IOL INSERTION 8.65 01:06.1 (Left: Eye)  Patient location during evaluation: PACU Anesthesia Type: MAC Level of consciousness: awake and alert Pain management: pain level controlled Vital Signs Assessment: post-procedure vital signs reviewed and stable Respiratory status: spontaneous breathing, nonlabored ventilation, respiratory function stable and patient connected to nasal cannula oxygen Cardiovascular status: stable and blood pressure returned to baseline Postop Assessment: no apparent nausea or vomiting Anesthetic complications: no   No notable events documented.   Last Vitals:  Vitals:   01/05/24 0930 01/05/24 0935  BP: 130/79 (!) 144/87  Pulse: 88 86  Resp: 16 17  Temp: (!) 36.3 C (!) 36.3 C  SpO2: 96% 96%    Last Pain:  Vitals:   01/05/24 0935  TempSrc:   PainSc: 0-No pain                 Lynwood KANDICE Clause

## 2024-01-06 ENCOUNTER — Encounter: Payer: Self-pay | Admitting: Urology

## 2024-01-07 ENCOUNTER — Ambulatory Visit: Attending: Cardiovascular Disease | Admitting: Cardiovascular Disease

## 2024-01-07 ENCOUNTER — Encounter: Payer: Self-pay | Admitting: Cardiovascular Disease

## 2024-01-07 VITALS — BP 128/74 | HR 83 | Ht 68.0 in | Wt 192.2 lb

## 2024-01-07 DIAGNOSIS — I10 Essential (primary) hypertension: Secondary | ICD-10-CM | POA: Insufficient documentation

## 2024-01-07 DIAGNOSIS — I25810 Atherosclerosis of coronary artery bypass graft(s) without angina pectoris: Secondary | ICD-10-CM | POA: Insufficient documentation

## 2024-01-07 DIAGNOSIS — Z01810 Encounter for preprocedural cardiovascular examination: Secondary | ICD-10-CM | POA: Diagnosis not present

## 2024-01-07 DIAGNOSIS — I5022 Chronic systolic (congestive) heart failure: Secondary | ICD-10-CM | POA: Diagnosis not present

## 2024-01-07 DIAGNOSIS — I739 Peripheral vascular disease, unspecified: Secondary | ICD-10-CM | POA: Insufficient documentation

## 2024-01-07 DIAGNOSIS — E785 Hyperlipidemia, unspecified: Secondary | ICD-10-CM | POA: Insufficient documentation

## 2024-01-07 MED ORDER — ASPIRIN 81 MG PO TBEC: 81.0000 mg | DELAYED_RELEASE_TABLET | Freq: Every day | ORAL | Status: AC

## 2024-01-07 NOTE — Progress Notes (Signed)
 Cardiology Office Note   Date:  01/07/2024   ID:  [name], DOB 1951/04/18, MRN [id number]  PCP:  [facility], Fenton, MD  Cardiologist:  Dr. Perla  Chief Complaint  Patient presents with   Follow-up    Pre op clearance no complaints today. Meds reviewed verbally with pt.      History of Present Illness: [name] is a 73 y.o. male who was referred by Barnie Press for evaluation of peripheral arterial disease. He has known history of coronary artery disease status post multiple PCI's and most recently CABG in 2022, chronic systolic heart failure with mildly reduced LV systolic function, essential hypertension, hyperlipidemia, obstructive sleep apnea, type 2 diabetes and prostate cancer. He was seen most recently in May for palpitations and right calf claudication.  Outpatient monitor showed frequent short runs of SVT the longest lasted 19 seconds with frequent PACs with a burden of 13%.  Lower extremity arterial Doppler showed an ABI of 0.72 on the right and normal on the left.  Duplex showed occluded distal right SFA. He reports right calf claudication that happens after short distance walking.  This significantly limits his ability to stay active.  He used to walk 2-1/2 miles per day with no issues but has not been able to do that over the last 2 years.  In addition, his symptoms have been slowly progressing to the point that he gets discomfort after walking less than 50 feet.  He denies any chest pain or shortness of breath.  He tries to stay active on a regular basis.  He built a fence yesterday and he does not experience any chest pain or shortness of breath. He is scheduled to have right shoulder surgery in the near future. He reports resolution of palpitations.  Past Medical History:  Diagnosis Date   Actinic keratoses    Angina    Asthma    BCC (basal cell carcinoma of skin) 09/06/2020   left upper back lateral (EDC) ,  left upper arm anterior  (EDC), left upper back medial (EDC)    Bursitis of left elbow 2018   CAD (coronary artery disease)    Coronary artery disease of native artery of native heart with stable angina pectoris (HCC)    Diabetes mellitus without complication [facility])    ED (erectile dysfunction)    Grade I diastolic dysfunction    Grade I diastolic dysfunction    History of heart artery stent    HTN (hypertension) 04/12/2011   Hyperlipemia 04/12/2011   Ischemic cardiomyopathy    a. echo 2010: EF 45-50%, mild to mod ant and apical wall HK, trace MR; b. cardiac cath 06/2012: EF 40%, mild MR    Multiple vessel coronary artery disease 04/12/2011   a. remote MI 1996; b. MI 1997 s/p PCI - LAD & diag; c. MI 2009: long stenting P-MLAD & Diag & POBA of jailed diag branch; d. cath 2010: 80% ISR of LAD w/ L-R collats, med Rx; e. cath 2012: 50-60% tub mLAD, 60-70% dLAD, FFR 0.75 -->s/p PCI dissection => w/ 2 stents (3 total); f. cath 06/2012: no changes from 2012 cath, med Rx, patent stents -> MV CAD 11/20/20 --> CABG X 5   Myocardial infarction (HCC)    x 5   Obesity    OSA (obstructive sleep apnea)    on CPAP   Osteoarthritis    Renal disorder    kidney stone   Uncontrolled type 2 diabetes mellitus with hyperglycemia, without  long-term current use of insulin  [facility])     Past Surgical History:  Procedure Laterality Date   CATARACT EXTRACTION W/PHACO Right 12/22/2023   Procedure: PHACOEMULSIFICATION, CATARACT, WITH IOL INSERTION 7.83, 00:55.5;  Surgeon: Jaye Fallow, MD;  Location: [facility];  Service: Ophthalmology;  Laterality: Right;   CATARACT EXTRACTION W/PHACO Left 01/05/2024   Procedure: PHACOEMULSIFICATION, CATARACT, WITH IOL INSERTION 8.65 01:06.1;  Surgeon: Jaye Fallow, MD;  Location: [facility];  Service: Ophthalmology;  Laterality: Left;   CORONARY ANGIOPLASTY     x 5   CORONARY ARTERY BYPASS GRAFT N/A 11/20/2020   Procedure: CORONARY ARTERY BYPASS GRAFTING (CABG) x  FIVE (LIMA-dLAD,  SVG-PDA, SVG-D1, SeqSVG-OM1-2) ON PUMP  USING LEFT INTERNAL MAMMARY ARTERTY AND LEFT ENDOSCOPIC GREATER SAPHENOUS VEIN CONDUITS, RIGHT LEG OPENED NOT HARVESTED;  Surgeon: German Bartlett PEDLAR, MD;  Location: [facility] OR;  Service: Open Heart Surgery;  Laterality: N/A;   CORONARY PRESSURE/FFR STUDY N/A 11/15/2020   Procedure: INTRAVASCULAR PRESSURE WIRE/FFR STUDY;  Surgeon: Mady Bruckner, MD;  Location: [facility] INVASIVE CV LAB;  Service: Cardiovascular;  Laterality: N/A;   CORONARY STENT PLACEMENT     x 7   LEFT HEART CATH AND CORONARY ANGIOGRAPHY N/A 11/15/2020   Procedure: LEFT HEART CATH AND CORONARY ANGIOGRAPHY;  Surgeon: Mady Bruckner, MD;  Location: [facility] INVASIVE CV LAB;  Service: Cardiovascular;  Laterality: N/A;   LEFT HEART CATHETERIZATION WITH CORONARY ANGIOGRAM N/A 04/14/2011   Procedure: LEFT HEART CATHETERIZATION WITH CORONARY ANGIOGRAM;  Surgeon: Vinie KYM Maxcy, MD;  Location: [facility] CATH LAB;  Service: Cardiovascular;  Laterality: N/A;   LEFT HEART CATHETERIZATION WITH CORONARY ANGIOGRAM N/A 06/25/2012   Procedure: LEFT HEART CATHETERIZATION WITH CORONARY ANGIOGRAM;  Surgeon: Peter M Swaz[place], MD;  Location: [facility] C[facility];  Service: Cardiovascular;  Laterality: N/A;   TEE WITHOUT CARDIOVERSION N/A 11/20/2020   Procedure: TRANSESOPHAGEAL ECHOCARDIOGRAM (TEE);  Surgeon: German Bartlett PEDLAR, MD;  Location: [facility] OR;  Service: Open Heart Surgery;  Laterality: N/A;   TONSILLECTOMY AND ADENOIDECTOMY  1955     Current Outpatient Medications  Medication Sig Dispense Refill   acetaminophen  (TYLENOL ) 500 MG tablet Take 2 tablets (1,000 mg total) by mouth every 6 (six) hours as needed. 100 tablet 2   aspirin  EC 325 MG EC tablet Take 1 tablet (325 mg total) by mouth daily. 30 tablet 0   atorvastatin  (LIPITOR ) 80 MG tablet Take 1 tablet (80 mg total) by mouth daily. 90 tablet 3   blood glucose meter kit and supplies KIT Dispense based on patient and insurance preference. Use up to four times daily as directed. 1  each 0   carvedilol  (COREG ) 6.25 MG tablet Take 1 tablet (6.25 mg total) by mouth 2 (two) times daily with a meal. 180 tablet 3   diphenhydramine -acetaminophen  (TYLENOL  PM) 25-500 MG TABS tablet Take 1 tablet by mouth at bedtime as needed.     ezetimibe  (ZETIA ) 10 MG tablet TAKE 1 TABLET DAILY 90 tablet 1   furosemide  (LASIX ) 40 MG tablet Take 1 tablet (40 mg total) by mouth daily. 90 tablet 3   insulin  glargine (LANTUS ) 100 UNIT/ML Solostar Pen Inject 25 Units into the skin daily. 30 mL 2   Insulin  Pen Needle 31G X 5 MM MISC Inject insulin  via insulin  pen daily 50 each 0   isosorbide  mononitrate (IMDUR ) 30 MG 24 hr tablet Take 1 tablet (30 mg total) by mouth daily. 90 tablet 3   losartan  (COZAAR ) 25 MG tablet Take 1 tablet (25 mg total) by mouth daily.  90 tablet 3   Magnesium  250 MG TABS Take 1 tablet by mouth daily.     metFORMIN  (GLUCOPHAGE ) 1000 MG tablet Take 1 tablet (1,000 mg total) by mouth 2 (two) times daily with a meal. 180 tablet 3   Multiple Vitamins-Minerals (MULTIVITAMINS THER. W/MINERALS) TABS Take 1 tablet by mouth every morning.     niacin  (NIASPAN ) 1000 MG CR tablet Take 2 tablets (2000 mg) by mouth once daily at bedtime 180 tablet 3   nitroGLYCERIN  (NITROLINGUAL ) 0.4 MG/SPRAY spray Place 1 spray under the tongue every 5 (five) minutes x 3 doses as needed for chest pain. 12 g 6   NON FORMULARY Take 1-2 tablets by mouth daily as needed (Pain). CBD Gummy     potassium chloride  SA (KLOR-CON  M) 20 MEQ tablet Take 1 tablet (20 mEq total) by mouth daily. 90 tablet 3   Semaglutide , 2 MG/DOSE, 8 MG/3ML SOPN Inject 2 mg as directed once a week. 3 mL 1   ketoconazole  (NIZORAL ) 2 % cream Apply twice daily to pink, scaly areas on face, chest, arms until improved. (Patient not taking: Reported on 01/07/2024) 60 g 3   No current facility-administered medications for this visit.    Allergies:   Hydroxyzine hcl and Latex    Social History:  The patient  reports that he quit smoking about  16 years ago. His smoking use included cigarettes. He started smoking about 56 years ago. He has a 60 pack-year smoking history. He has never used smokeless tobacco. He reports that he does not currently use drugs after having used the following drugs: Marijuana. He reports that he does not drink alcohol.   Family History:  The patient's family history includes Alcoholism in his father; COPD in his mother; Heart block in his sister; Heart disease in his father; Other in his father; Prostate cancer in his father; Stroke in his father and sister.    ROS:  Please see the history of present illness.   Otherwise, review of systems are positive for none.   All other systems are reviewed and negative.    PHYSICAL EXAM: VS:  BP 128/74 (BP Location: Left Arm, Patient Position: Sitting, Cuff Size: Normal)   Pulse 83   Ht 5' 8 (1.727 m)   Wt 192 lb 4 oz (87.2 kg)   SpO2 98%   BMI 29.23 kg/m  , BMI Body mass index is 29.23 kg/m. GEN: Well nourished, well developed, in no acute distress  HEENT: normal  Neck: no JVD, carotid bruits, or masses Cardiac: RRR; no murmurs, rubs, or gallops,no edema  Respiratory:  clear to auscultation bilaterally, normal work of breathing GI: soft, nontender, nondistended, + BS MS: no deformity or atrophy  Skin: warm and dry, no rash Neuro:  Strength and sensation are intact Psych: euthymic mood, full affect   EKG:  EKG is ordered today. The ekg ordered today demonstrates : Normal sinus rhythm Septal infarct (cited on or before 15-Nov-2020) When compared with ECG of 09-Oct-2023 14:47, No significant change was found    Recent Labs: 08/18/2023: Hemoglobin 14.4; Platelets 183 11/16/2023: ALT 16; BUN 15; Creatinine, Ser 1.08; Potassium 4.3; Sodium 137    Lipid Panel    Component Value Date/Time   CHOL 181 11/16/2023 1120   CHOL 95 (L) 04/10/2021 0756   TRIG 124.0 11/16/2023 1120   HDL 40.80 11/16/2023 1120   HDL 32 (L) 04/10/2021 0756   CHOLHDL 4 11/16/2023  1120   VLDL 24.8 11/16/2023 1120   LDLCALC  115 (H) 11/16/2023 1120   LDLCALC 36 04/02/2022 1429   LDLDIRECT 71.0 07/11/2019 1424      Wt Readings from Last 3 Encounters:  01/07/24 192 lb 4 oz (87.2 kg)  01/05/24 191 lb 11.2 oz (87 kg)  12/22/23 192 lb (87.1 kg)           No data to display            ASSESSMENT AND PLAN:  1.  Peripheral arterial disease: Severe right calf claudication Rutherford class III.  I discussed with him the natural history and management of claudication.  I discussed the importance of controlling his risk factors.  Fortunately, he is not a smoker. I offered him to refer to a structured exercise therapy program but he feels so limited by his symptoms that he does not think it will help.  He is interested in revascularization given progressive nature of his symptoms and significantly affecting his lifestyle.  I asked him to stay as active as he can and will reevaluate his symptoms in 2 months after he gets that shoulder surgery and likely proceed with angiography and endovascular intervention.  2.  Coronary artery disease involving native coronary arteries without angina: He is doing very well with no anginal symptoms.  3.  Chronic systolic heart failure with mildly reduced LV systolic function: No evidence of volume overload and he seems to be well compensated  4.  Essential hypertension: Blood pressure is controlled.  5.  Hyperlipidemia: Most recent LDL was elevated but he was not taking atorvastatin  at that time by mistake.  Since then, he resumed atorvastatin .  Recommended target LDL of less than 55.  6.  Preop cardiovascular evaluation for right shoulder surgery: The patient has no anginal symptoms or symptoms of heart failure.  His EKG is unchanged and his functional capacity is excellent.  He does not require ischemic cardiac evaluation and can proceed with surgery at an overall low risk.It is preferable not to interrupt the aspirin .  However, if  bleeding risk is too high, aspirin  can be held 5 days before the surgery.    Disposition:   FU with me in 2 months  Signed,  Deatrice Cage, MD  01/07/2024 8:21 AM    [facility]

## 2024-01-07 NOTE — Patient Instructions (Signed)
 Medication Instructions:  DECREASE the Aspirin  to 81 mg once daily  *If you need a refill on your cardiac medications before your next appointment, please call your pharmacy*  Lab Work: None ordered If you have labs (blood work) drawn today and your tests are completely normal, you will receive your results only by: MyChart Message (if you have MyChart) OR A paper copy in the mail If you have any lab test that is abnormal or we need to change your treatment, we will call you to review the results.  Testing/Procedures: None ordered  Follow-Up: At [facility], you and your health needs are our priority.  As part of our continuing mission to provide you with exceptional heart care, our providers are all part of one team.  This team includes your primary Cardiologist (physician) and Advanced Practice Providers or APPs (Physician Assistants and Nurse Practitioners) who all work together to provide you with the care you need, when you need it.  Your next appointment:   2 month(s)  Provider:   You may see Timothy Gollan, MD or one of the following Advanced Practice Providers on your designated Care Team:   Lonni Meager, NP Lesley Maffucci, PA-C Bernardino Bring, PA-C Cadence [place], PA-C Tylene Lunch, NP [name], NP    We recommend signing up for the patient portal called MyChart.  Sign up information is provided on this After Visit Summary.  MyChart is used to connect with patients for Virtual Visits (Telemedicine).  Patients are able to view lab/test results, encounter notes, upcoming appointments, etc.  Non-urgent messages can be sent to your provider as well.   To learn more about what you can do with MyChart, go to [email or web address].

## 2024-01-13 ENCOUNTER — Other Ambulatory Visit: Payer: Self-pay | Admitting: Internal Medicine

## 2024-01-13 ENCOUNTER — Ambulatory Visit: Admitting: Student

## 2024-01-13 DIAGNOSIS — E1165 Type 2 diabetes mellitus with hyperglycemia: Secondary | ICD-10-CM

## 2024-01-13 NOTE — Telephone Encounter (Signed)
 Copied from CRM #[id number]. Topic: Clinical - Medication Question >> Jan 13, 2024 12:13 PM Aisha D wrote: Reason for CRM: Pt is requesting a 90 day supply for the Semaglutide , 2 MG/DOSE, 8 MG/3ML SOPN. Pt just submitted a refill request for the medication and stated that he pays the same copay for a 30 and 90 day supply so would like the medication to be changed to a 90 day.

## 2024-01-13 NOTE — Telephone Encounter (Signed)
 FYI Only or Action Required?: Action required by provider: medication refill request.  Patient was last seen in primary care on 11/16/2023 by Onesimo Claude, MD.  Called Nurse Triage reporting No chief complaint on file..  Symptoms began today.  Interventions attempted: Nothing.  Symptoms are: stable.  Triage Disposition: No disposition on file.  Patient/caregiver understands and will follow disposition?:

## 2024-01-13 NOTE — Telephone Encounter (Signed)
 Copied from CRM [id number]. Topic: Clinical - Medication Refill >> Jan 13, 2024 12:09 PM Aisha D wrote: Medication: Semaglutide , 2 MG/DOSE, 8 MG/3ML SOPN  Has the patient contacted their pharmacy? No (Agent: If no, request that the patient contact the pharmacy for the refill. If patient does not wish to contact the pharmacy document the reason why and proceed with request.) (Agent: If yes, when and what did the pharmacy advise?)  This is the patient's preferred pharmacy:  [facility] - [place], [place] - [address] [place] [address] Phone: [phone] Fax: [phone]   Is this the correct pharmacy for this prescription? Yes If no, delete pharmacy and type the correct one.   Has the prescription been filled recently? No  Is the patient out of the medication? Yes  Has the patient been seen for an appointment in the last year OR does the patient have an upcoming appointment? Yes  Can we respond through MyChart? Yes  Agent: Please be advised that Rx refills may take up to 3 business days. We ask that you follow-up with your pharmacy.

## 2024-01-14 ENCOUNTER — Encounter (HOSPITAL_BASED_OUTPATIENT_CLINIC_OR_DEPARTMENT_OTHER): Payer: Self-pay

## 2024-01-15 ENCOUNTER — Ambulatory Visit: Admitting: Cardiovascular Disease

## 2024-01-18 ENCOUNTER — Ambulatory Visit (INDEPENDENT_AMBULATORY_CARE_PROVIDER_SITE_OTHER): Admitting: *Deleted

## 2024-01-18 VITALS — Ht 68.0 in | Wt 190.0 lb

## 2024-01-18 DIAGNOSIS — Z0000 Encounter for general adult medical examination without abnormal findings: Secondary | ICD-10-CM | POA: Diagnosis not present

## 2024-01-18 MED ORDER — SEMAGLUTIDE (2 MG/DOSE) 8 MG/3ML ~~LOC~~ SOPN: 2.0000 mg | PEN_INJECTOR | SUBCUTANEOUS | 0 refills | Status: DC

## 2024-01-18 NOTE — Patient Instructions (Addendum)
 [name],  Thank you for taking the time for your Medicare Wellness Visit. I appreciate your continued commitment to your health goals. Please review the care plan we discussed, and feel free to reach out if I can assist you further.  Medicare recommends these wellness visits once per year to help you and your care team stay ahead of potential health issues. These visits are designed to focus on prevention, allowing your provider to concentrate on managing your acute and chronic conditions during your regular appointments.  Please note that Annual Wellness Visits do not include a physical exam. Some assessments may be limited, especially if the visit was conducted virtually. If needed, we may recommend a separate in-person follow-up with your provider.  Ongoing Care Seeing your primary care provider every 3 to 6 months helps us  monitor your health and provide consistent, personalized care. Remember to update your flu and shingles vaccines as discussed.   Referrals If a referral was made during today's visit and you haven't received any updates within two weeks, please contact the referred provider directly to check on the status.  Recommended Screenings:  Health Maintenance  Topic Date Due   Colon Cancer Screening  11/08/2016   Zoster (Shingles) Vaccine (2 of 2) 11/27/2021   Complete foot exam   10/10/2023   Eye exam for diabetics  11/19/2023   Flu Shot  12/11/2023   COVID-19 Vaccine (5 - 2025-26 season) 01/11/2024   Hemoglobin A1C  05/18/2024   Yearly kidney function blood test for diabetes  11/15/2024   Yearly kidney health urinalysis for diabetes  11/15/2024   Medicare Annual Wellness Visit  01/17/2025   DTaP/Tdap/Td vaccine (2 - Td or Tdap) 06/22/2033   Pneumococcal Vaccine for age over 13  Completed   Hepatitis C Screening  Completed   HPV Vaccine  Aged Out   Meningitis B Vaccine  Aged Out   Screening for Lung Cancer  Discontinued       01/18/2024   11:07 AM  Advanced  Directives  Does Patient Have a Medical Advance Directive? Yes  Type of Estate agent of [place];Living will  Copy of Healthcare Power of Attorney in Chart? No - copy requested  Would patient like information on creating a medical advance directive? No - Patient declined   Advance Care Planning is important because it: Ensures you receive medical care that aligns with your values, goals, and preferences. Provides guidance to your family and loved ones, reducing the emotional burden of decision-making during critical moments.  Vision: Annual vision screenings are recommended for early detection of glaucoma, cataracts, and diabetic retinopathy. These exams can also reveal signs of chronic conditions such as diabetes and high blood pressure.  Dental: Annual dental screenings help detect early signs of oral cancer, gum disease, and other conditions linked to overall health, including heart disease and diabetes.  Please see the attached documents for additional preventive care recommendations.

## 2024-01-18 NOTE — Progress Notes (Signed)
 Subjective:   [name] is a 73 y.o. who presents for a Medicare Wellness preventive visit.  As a reminder, Annual Wellness Visits don't include a physical exam, and some assessments may be limited, especially if this visit is performed virtually. We may recommend an in-person follow-up visit with your provider if needed.  Visit Complete: Virtual I connected with  [name] on 01/18/24 by a audio enabled telemedicine application and verified that I am speaking with the correct person using two identifiers.  Patient Location: Home  Provider Location: Home Office  I discussed the limitations of evaluation and management by telemedicine. The patient expressed understanding and agreed to proceed.  Vital Signs: Because this visit was a virtual/telehealth visit, some criteria may be missing or patient reported. Any vitals not documented were not able to be obtained and vitals that have been documented are patient reported.  VideoDeclined- This patient declined Librarian, academic. Therefore the visit was completed with audio only.  Persons Participating in Visit: Patient.  AWV Questionnaire: No: Patient Medicare AWV questionnaire was not completed prior to this visit.  Cardiac Risk Factors include: advanced age (>72men, >11 women);diabetes mellitus;hypertension;dyslipidemia;male gender     Objective:    Today's Vitals   01/18/24 1052  Weight: 190 lb (86.2 kg)  Height: 5' 8 (1.727 m)   Body mass index is 28.89 kg/m.     01/18/2024   11:07 AM 01/05/2024    8:16 AM 12/22/2023    7:00 AM 10/30/2023   10:42 AM 10/19/2023   10:06 AM 08/18/2023    5:38 AM 04/16/2023    9:50 AM  Advanced Directives  Does Patient Have a Medical Advance Directive? Yes No No No No No Yes  Type of Estate agent of [place];Living will      Healthcare Power of [place];Living will  Does patient want to make changes to medical advance  directive?    No - Patient declined No - Patient declined  No - Patient declined  Copy of Healthcare Power of Attorney in Chart? No - copy requested      No - copy requested  Would patient like information on creating a medical advance directive? No - Patient declined No - Patient declined Yes (MAU/Ambulatory/Procedural Areas - Information given)  No - Patient declined      Current Medications (verified) Outpatient Encounter Medications as of 01/18/2024  Medication Sig   acetaminophen  (TYLENOL ) 500 MG tablet Take 2 tablets (1,000 mg total) by mouth every 6 (six) hours as needed.   aspirin  EC 81 MG tablet Take 1 tablet (81 mg total) by mouth daily.   atorvastatin  (LIPITOR ) 80 MG tablet Take 1 tablet (80 mg total) by mouth daily.   blood glucose meter kit and supplies KIT Dispense based on patient and insurance preference. Use up to four times daily as directed.   carvedilol  (COREG ) 6.25 MG tablet Take 1 tablet (6.25 mg total) by mouth 2 (two) times daily with a meal.   diphenhydramine -acetaminophen  (TYLENOL  PM) 25-500 MG TABS tablet Take 1 tablet by mouth at bedtime as needed.   ezetimibe  (ZETIA ) 10 MG tablet TAKE 1 TABLET DAILY   furosemide  (LASIX ) 40 MG tablet Take 1 tablet (40 mg total) by mouth daily.   insulin  glargine (LANTUS ) 100 UNIT/ML Solostar Pen Inject 25 Units into the skin daily.   Insulin  Pen Needle 31G X 5 MM MISC Inject insulin  via insulin  pen daily   isosorbide  mononitrate (IMDUR ) 30 MG 24  hr tablet Take 1 tablet (30 mg total) by mouth daily.   ketoconazole  (NIZORAL ) 2 % cream Apply twice daily to pink, scaly areas on face, chest, arms until improved. (Patient taking differently: 2 (two) times daily as needed. Apply twice daily to pink, scaly areas on face, chest, arms until improved.)   losartan  (COZAAR ) 25 MG tablet Take 1 tablet (25 mg total) by mouth daily.   Magnesium  250 MG TABS Take 1 tablet by mouth daily. (Patient taking differently: Take 1 tablet by mouth in the morning  and at bedtime.)   metFORMIN  (GLUCOPHAGE ) 1000 MG tablet Take 1 tablet (1,000 mg total) by mouth 2 (two) times daily with a meal.   Multiple Vitamins-Minerals (MULTIVITAMINS THER. W/MINERALS) TABS Take 1 tablet by mouth every morning.   niacin  (NIASPAN ) 1000 MG CR tablet Take 2 tablets (2000 mg) by mouth once daily at bedtime   nitroGLYCERIN  (NITROLINGUAL ) 0.4 MG/SPRAY spray Place 1 spray under the tongue every 5 (five) minutes x 3 doses as needed for chest pain.   potassium chloride  SA (KLOR-CON  M) 20 MEQ tablet Take 1 tablet (20 mEq total) by mouth daily.   Semaglutide , 2 MG/DOSE, 8 MG/3ML SOPN Inject 2 mg as directed once a week.   NON FORMULARY Take 1-2 tablets by mouth daily as needed (Pain). CBD Gummy (Patient not taking: Reported on 01/18/2024)   No facility-administered encounter medications on file as of 01/18/2024.    Allergies (verified) Hydroxyzine hcl and Latex   History: Past Medical History:  Diagnosis Date   Actinic keratoses    Angina    Asthma    BCC (basal cell carcinoma of skin) 09/06/2020   left upper back lateral (EDC) ,  left upper arm anterior (EDC), left upper back medial (EDC)    Bursitis of left elbow 2018   CAD (coronary artery disease)    Coronary artery disease of native artery of native heart with stable angina pectoris (HCC)    Diabetes mellitus without complication [facility])    ED (erectile dysfunction)    Grade I diastolic dysfunction    Grade I diastolic dysfunction    History of heart artery stent    HTN (hypertension) 04/12/2011   Hyperlipemia 04/12/2011   Ischemic cardiomyopathy    a. echo 2010: EF 45-50%, mild to mod ant and apical wall HK, trace MR; b. cardiac cath 06/2012: EF 40%, mild MR    Multiple vessel coronary artery disease 04/12/2011   a. remote MI 1996; b. MI 1997 s/p PCI - LAD & diag; c. MI 2009: long stenting P-MLAD & Diag & POBA of jailed diag branch; d. cath 2010: 80% ISR of LAD w/ L-R collats, med Rx; e. cath 2012: 50-60% tub mLAD,  60-70% dLAD, FFR 0.75 -->s/p PCI dissection => w/ 2 stents (3 total); f. cath 06/2012: no changes from 2012 cath, med Rx, patent stents -> MV CAD 11/20/20 --> CABG X 5   Myocardial infarction (HCC)    x 5   Obesity    OSA (obstructive sleep apnea)    on CPAP   Osteoarthritis    Renal disorder    kidney stone   Uncontrolled type 2 diabetes mellitus with hyperglycemia, without long-term current use of insulin  [facility])    Past Surgical History:  Procedure Laterality Date   CATARACT EXTRACTION W/PHACO Right 12/22/2023   Procedure: PHACOEMULSIFICATION, CATARACT, WITH IOL INSERTION 7.83, 00:55.5;  Surgeon: Jaye Fallow, MD;  Location: [facility];  Service: Ophthalmology;  Laterality: Right;   CATARACT  EXTRACTION W/PHACO Left 01/05/2024   Procedure: PHACOEMULSIFICATION, CATARACT, WITH IOL INSERTION 8.65 01:06.1;  Surgeon: Jaye Fallow, MD;  Location: [facility];  Service: Ophthalmology;  Laterality: Left;   CORONARY ANGIOPLASTY     x 5   CORONARY ARTERY BYPASS GRAFT N/A 11/20/2020   Procedure: CORONARY ARTERY BYPASS GRAFTING (CABG) x  FIVE (LIMA-dLAD, SVG-PDA, SVG-D1, SeqSVG-OM1-2) ON PUMP  USING LEFT INTERNAL MAMMARY ARTERTY AND LEFT ENDOSCOPIC GREATER SAPHENOUS VEIN CONDUITS, RIGHT LEG OPENED NOT HARVESTED;  Surgeon: German Bartlett PEDLAR, MD;  Location: [facility] OR;  Service: Open Heart Surgery;  Laterality: N/A;   CORONARY PRESSURE/FFR STUDY N/A 11/15/2020   Procedure: INTRAVASCULAR PRESSURE WIRE/FFR STUDY;  Surgeon: Mady Bruckner, MD;  Location: [facility] INVASIVE CV LAB;  Service: Cardiovascular;  Laterality: N/A;   CORONARY STENT PLACEMENT     x 7   LEFT HEART CATH AND CORONARY ANGIOGRAPHY N/A 11/15/2020   Procedure: LEFT HEART CATH AND CORONARY ANGIOGRAPHY;  Surgeon: Mady Bruckner, MD;  Location: [facility] INVASIVE CV LAB;  Service: Cardiovascular;  Laterality: N/A;   LEFT HEART CATHETERIZATION WITH CORONARY ANGIOGRAM N/A 04/14/2011   Procedure: LEFT HEART CATHETERIZATION WITH  CORONARY ANGIOGRAM;  Surgeon: Vinie KYM Maxcy, MD;  Location: [facility] CATH LAB;  Service: Cardiovascular;  Laterality: N/A;   LEFT HEART CATHETERIZATION WITH CORONARY ANGIOGRAM N/A 06/25/2012   Procedure: LEFT HEART CATHETERIZATION WITH CORONARY ANGIOGRAM;  Surgeon: Peter M Swaziland, MD;  Location: [facility] CATH LAB;  Service: Cardiovascular;  Laterality: N/A;   TEE WITHOUT CARDIOVERSION N/A 11/20/2020   Procedure: TRANSESOPHAGEAL ECHOCARDIOGRAM (TEE);  Surgeon: German Bartlett PEDLAR, MD;  Location: [facility] OR;  Service: Open Heart Surgery;  Laterality: N/A;   TONSILLECTOMY AND ADENOIDECTOMY  1955   Family History  Problem Relation Age of Onset   COPD Mother    Other Father        muscle tumor   Stroke Father    Prostate cancer Father        mets   Heart disease Father    Alcoholism Father    Stroke Sister    Heart block Sister        Heart bypass   Bladder Cancer Neg Hx    Kidney cancer Neg Hx    Social History   Socioeconomic History   Marital status: Married    Spouse name: Rojelio   Number of children: 2   Years of education: Not on file   Highest education level: Not on file  Occupational History   Occupation: [employer]-retired   Occupation: Airline pilot  Tobacco Use   Smoking status: Former    Current packs/day: 0.00    Average packs/day: 1.5 packs/day for 40.0 years (60.0 ttl pk-yrs)    Types: Cigarettes    Start date: 09/14/1967    Quit date: 09/14/2007    Years since quitting: 16.3   Smokeless tobacco: Never   Tobacco comments:    quit 2009  Vaping Use   Vaping status: Never Used  Substance and Sexual Activity   Alcohol use: No    Alcohol/week: 0.0 standard drinks of alcohol    Comment: rarely 1 beer in last 5 years   Drug use: Not Currently    Types: Marijuana    Comment: pt stopped fall 2012   Sexual activity: Never  Other Topics Concern   Not on file  Social History Narrative   Retired twice    Married to [place], has 2 children from previous marriage   Pet: 3 dogs    Social  Drivers  of Health   Financial Resource Strain: Low Risk  (01/18/2024)   Overall Financial Resource Strain (CARDIA)    Difficulty of Paying Living Expenses: Not hard at all  Food Insecurity: No Food Insecurity (01/18/2024)   Hunger Vital Sign    Worried About Running Out of Food in the Last Year: Never true    Ran Out of Food in the Last Year: Never true  Transportation Needs: No Transportation Needs (01/18/2024)   PRAPARE - Administrator, Civil Service (Medical): No    Lack of Transportation (Non-Medical): No  Physical Activity: Inactive (01/18/2024)   Exercise Vital Sign    Days of Exercise per Week: 0 days    Minutes of Exercise per Session: 0 min  Stress: No Stress Concern Present (01/18/2024)   [employer] of Occupational Health - Occupational Stress Questionnaire    Feeling of Stress: Not at all  Social Connections: Moderately Isolated (01/18/2024)   Social Connection and Isolation Panel    Frequency of Communication with Friends and Family: More than three times a week    Frequency of Social Gatherings with Friends and Family: More than three times a week    Attends Religious Services: Never    Database administrator or Organizations: No    Attends Banker Meetings: Never    Marital Status: Married    Tobacco Counseling Counseling given: Not Answered Tobacco comments: quit 2009    Clinical Intake:  Pre-visit preparation completed: Yes  Pain : No/denies pain     BMI - recorded: 28.89 Nutritional Risks: None Diabetes: Yes CBG done?: No  Lab Results  Component Value Date   HGBA1C 6.1 (A) 11/16/2023   HGBA1C 9.4 (H) 09/17/2022   HGBA1C 12.1 (H) 06/30/2022     How often do you need to have someone help you when you read instructions, pamphlets, or other written materials from your doctor or pharmacy?: 1 - Never  Interpreter Needed?: No  Information entered by :: R. Estreya Clay LPN   Activities of Daily Living     01/18/2024   10:54 AM  01/05/2024    8:15 AM  In your present state of health, do you have any difficulty performing the following activities:  Hearing? 0 0  Vision? 0 0  Difficulty concentrating or making decisions? 0 0  Walking or climbing stairs? 0   Dressing or bathing? 0   Doing errands, shopping? 0   Preparing Food and eating ? N   Using the Toilet? N   In the past six months, have you accidently leaked urine? N   Do you have problems with loss of bowel control? N   Managing your Medications? N   Managing your Finances? N   Housekeeping or managing your Housekeeping? N     Patient Care Team: Pccm, Fenton, MD as PCP - General (Internal Medicine) Perla Evalene PARAS, MD as PCP - Cardiology (Cardiology) Perla Evalene PARAS, MD as Consulting Physician (Cardiology) Maribeth Camellia MATSU, MD (Inactive) as Consulting Physician (Family Medicine) Vernetta Lonni GRADE, MD as Consulting Physician (Orthopedic Surgery) [facility] (Dermatology) Penne Knee, MD (Inactive) as Consulting Physician (Urology)  I have updated your Care Teams any recent Medical Services you may have received from other providers in the past year.     Assessment:   This is a routine wellness examination for Fowler.  Hearing/Vision screen Hearing Screening - Comments:: No issues Vision Screening - Comments:: glasses   Goals Addressed  This Visit's Progress    Patient Stated       Wants to get his leg fixed so he can start back exercising       Depression Screen     01/18/2024   11:02 AM 11/16/2023   11:39 AM 10/30/2023   10:54 AM 01/27/2023   12:35 PM 01/21/2023    9:55 AM 12/25/2022    9:25 AM 10/10/2022    4:01 PM  PHQ 2/9 Scores  PHQ - 2 Score 0 0 0 0 0 0 0  PHQ- 9 Score 0 1    0 4    Fall Risk     01/18/2024   10:56 AM 01/27/2023   12:35 PM 01/21/2023    9:55 AM 12/25/2022    9:32 AM 09/19/2022   10:28 AM  Fall Risk   Falls in the past year? 0 0 0 0 0  Number falls in past yr: 0   0 0   Injury with Fall? 0   0 0  Risk for fall due to : No Fall Risks   No Fall Risks   Follow up Falls evaluation completed;Falls prevention discussed   Falls prevention discussed     MEDICARE RISK AT HOME:  Medicare Risk at Home Any stairs in or around the home?: Yes If so, are there any without handrails?: No Home free of loose throw rugs in walkways, pet beds, electrical cords, etc?: Yes Adequate lighting in your home to reduce risk of falls?: Yes Life alert?: No Use of a cane, walker or w/c?: No Grab bars in the bathroom?: No Shower chair or bench in shower?: No Elevated toilet seat or a handicapped toilet?: No  TIMED UP AND GO:  Was the test performed?  No  Cognitive Function: 6CIT completed    06/03/2018    9:48 AM  MMSE - Mini Mental State Exam  Orientation to time 5  Orientation to Place 5  Registration 3  Attention/ Calculation 5  Recall 3  Language- name 2 objects 2  Language- repeat 1  Language- follow 3 step command 3  Language- read & follow direction 1  Write a sentence 1  Copy design 1  Total score 30        01/18/2024   11:08 AM 12/25/2022    9:34 AM 06/06/2019    9:00 AM  6CIT Screen  What Year? 0 points 0 points 0 points  What month? 0 points 0 points 0 points  What time? 0 points 0 points 0 points  Count back from 20 0 points 0 points 0 points  Months in reverse 0 points 0 points 0 points  Repeat phrase 0 points 0 points 0 points  Total Score 0 points 0 points 0 points    Immunizations Immunization History  Administered Date(s) Administered   Fluad Quad(high Dose 65+) 03/20/2021   INFLUENZA, HIGH DOSE SEASONAL PF 02/07/2016, 01/26/2018, 06/23/2023   Influenza,inj,Quad PF,6+ Mos 03/13/2014, 02/26/2015, 12/31/2021   Moderna Sars-Covid-2 Vaccination 08/08/2019, 09/05/2019, 03/12/2020, 04/24/2020   PNEUMOCOCCAL CONJUGATE-20 10/02/2021   Pneumococcal Conjugate-13 06/03/2018   Pneumococcal Polysaccharide-23 03/20/2021   Tdap 06/23/2023   Zoster  Recombinant(Shingrix) 10/02/2021   Zoster, Live 05/13/2011, 07/27/2014    Screening Tests Health Maintenance  Topic Date Due   Colonoscopy  11/08/2016   Zoster Vaccines- Shingrix (2 of 2) 11/27/2021   FOOT EXAM  10/10/2023   OPHTHALMOLOGY EXAM  11/19/2023   Influenza Vaccine  12/11/2023   Medicare Annual Wellness (AWV)  12/25/2023  COVID-19 Vaccine (5 - 2025-26 season) 01/11/2024   HEMOGLOBIN A1C  05/18/2024   Diabetic kidney evaluation - eGFR measurement  11/15/2024   Diabetic kidney evaluation - Urine ACR  11/15/2024   DTaP/Tdap/Td (2 - Td or Tdap) 06/22/2033   Pneumococcal Vaccine: 50+ Years  Completed   Hepatitis C Screening  Completed   HPV VACCINES  Aged Out   Meningococcal B Vaccine  Aged Out   Lung Cancer Screening  Discontinued    Health Maintenance Items Addressed: Discussed the need to update flu and second shingles vaccines. Patient declines colonoscopy but will discuss with PCP at next visit. Patient declines covid vaccine. Patient needs a diabetic foot exam completed and documented at next office visit.   Additional Screening:  Vision Screening: Recommended annual ophthalmology exams for early detection of glaucoma and other disorders of the eye. Is the patient up to date with their annual eye exam?  Yes  Patient recently had cataract surgery but is not sure if they did a diabetic eye exam but will call and check.  Who is the provider or what is the name of the office in which the patient attends annual eye exams? [facility]    Dental Screening: Recommended annual dental exams for proper oral hygiene  Community Resource Referral / Chronic Care Management: CRR required this visit?  No   CCM required this visit?  No   Plan:    I have personally reviewed and noted the following in the patient's chart:   Medical and social history Use of alcohol, tobacco or illicit drugs  Current medications and supplements including opioid prescriptions. Patient is not  currently taking opioid prescriptions. Functional ability and status Nutritional status Physical activity Advanced directives List of other physicians Hospitalizations, surgeries, and ER visits in previous 12 months Vitals Screenings to include cognitive, depression, and falls Referrals and appointments  In addition, I have reviewed and discussed with patient certain preventive protocols, quality metrics, and best practice recommendations. A written personalized care plan for preventive services as well as general preventive health recommendations were provided to patient.   Angeline Fredericks, LPN   [id number]   After Visit Summary: (MyChart) Due to this being a telephonic visit, the after visit summary with patients personalized plan was offered to patient via MyChart   Notes: Nothing significant to report at this time.

## 2024-01-20 ENCOUNTER — Other Ambulatory Visit

## 2024-01-27 ENCOUNTER — Other Ambulatory Visit

## 2024-01-27 DIAGNOSIS — C61 Malignant neoplasm of prostate: Secondary | ICD-10-CM | POA: Diagnosis not present

## 2024-01-27 LAB — HM DIABETES EYE EXAM

## 2024-01-28 ENCOUNTER — Ambulatory Visit: Payer: Self-pay | Admitting: Urology

## 2024-01-28 LAB — PSA: Prostate Specific Ag, Serum: 4.6 ng/mL — ABNORMAL HIGH (ref 0.0–4.0)

## 2024-01-28 NOTE — Progress Notes (Signed)
 Called and the patient understood and I got his appointment moved from November to Monday 02/01/24 at 3:30

## 2024-01-29 NOTE — Progress Notes (Signed)
   02/01/2024 4:05 PM   [name] [facility] [birth year] [id number]  Reason for visit: Follow up history of prostate cancer, nephrolithiasis   HPI: 73 year old man here for initial follow-up with me, previously seen by Dr. Penne in November 2024 Very pleasant gentleman He has not had any interval changes in his urologic health, denies new urinary symptoms, GH  Prior HPI: Previously seen by Dr. 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   S/p XRT plus ADT x 6 months -pleated April 2023  History of low-grade right UPJO -conservatively History of bilateral nonobstructing stones up to 4 mm-expectant management, previously asymptomatic  Physical Exam: BP 103/62   Pulse 98   Ht 5' 8 (1.727 m)   Wt 190 lb (86.2 kg)   BMI 28.89 kg/m    Constitutional:  Alert and oriented, No acute distress.    Laboratory Data: Component Ref Range & Units (hover) 5 d ago (01/27/24) 3 mo ago (10/08/23) 10 mo ago (03/26/23) 1 yr ago (09/25/22) 1 yr ago (02/25/22) 7 yr ago (09/11/16)  Prostate Specific Ag, Serum 4.6 High  2.2 CM 1.1 CM 0.3 CM <0.1 CM 2.3 CM    Pertinent Imaging: N/A   Assessment & Plan:    UPJ obstruction, acquired -     Bladder Scan (Post Void Residual) in office  Prostate cancer [facility]) Assessment & Plan: Unfavorable intermediate risk prostate cancer (dx March 2023) S/p XRT plus ADT x 6 months-completed April 2023 BCR w/ rising PSA state, 4.6 <-- 2.2 <-- 1.1  PSADT ~4-6 mo  - UA today - proceed with PSMA PET for interval staging - pt following up with Rad/Onc next month, if localized BCR, query role of salvage XRT  - otherwise, will consider restarting ADT        Penne JONELLE Skye, MD  [facility] [address] [place], [place] [address] [phone]

## 2024-01-29 NOTE — Assessment & Plan Note (Addendum)
 Unfavorable intermediate risk prostate cancer (dx March 2023) S/p XRT plus ADT x 6 months-completed April 2023 BCR w/ rising PSA state, 4.6 <-- 2.2 <-- 1.1  PSADT ~4-6 mo  - UA today - proceed with PSMA PET for interval staging - pt following up with Rad/Onc next month, if localized BCR, query role of salvage XRT  - otherwise, will consider restarting ADT

## 2024-02-01 ENCOUNTER — Encounter: Payer: Self-pay | Admitting: Urology

## 2024-02-01 ENCOUNTER — Ambulatory Visit (INDEPENDENT_AMBULATORY_CARE_PROVIDER_SITE_OTHER): Admitting: Urology

## 2024-02-01 VITALS — BP 103/62 | HR 98 | Ht 68.0 in | Wt 190.0 lb

## 2024-02-01 DIAGNOSIS — C61 Malignant neoplasm of prostate: Secondary | ICD-10-CM

## 2024-02-01 DIAGNOSIS — N135 Crossing vessel and stricture of ureter without hydronephrosis: Secondary | ICD-10-CM | POA: Diagnosis not present

## 2024-02-01 LAB — BLADDER SCAN AMB NON-IMAGING

## 2024-02-01 NOTE — Patient Instructions (Signed)
 Scheduling number: [phone]

## 2024-02-02 LAB — MICROSCOPIC EXAMINATION

## 2024-02-08 ENCOUNTER — Ambulatory Visit
Admission: EM | Admit: 2024-02-08 | Discharge: 2024-02-08 | Disposition: A | Discharge status: Home / Self Care | Attending: Emergency Medicine | Admitting: Emergency Medicine

## 2024-02-08 ENCOUNTER — Telehealth: Payer: Self-pay | Admitting: Orthopedic Surgery

## 2024-02-08 DIAGNOSIS — K047 Periapical abscess without sinus: Secondary | ICD-10-CM

## 2024-02-08 DIAGNOSIS — R220 Localized swelling, mass and lump, head: Secondary | ICD-10-CM | POA: Diagnosis not present

## 2024-02-08 LAB — MICROSCOPIC EXAMINATION

## 2024-02-08 LAB — URINALYSIS, COMPLETE
Bilirubin, UA: NEGATIVE
Glucose, UA: NEGATIVE
Ketones, UA: NEGATIVE
Nitrite, UA: NEGATIVE
Protein,UA: NEGATIVE
RBC, UA: NEGATIVE
Specific Gravity, UA: 1.02 (ref 1.005–1.030)
Urobilinogen, Ur: 0.2 mg/dL (ref 0.2–1.0)
pH, UA: 6 (ref 5.0–7.5)

## 2024-02-08 MED ORDER — AMOXICILLIN-POT CLAVULANATE 875-125 MG PO TABS: 1.0000 | ORAL_TABLET | Freq: Two times a day (BID) | ORAL | 0 refills | Status: DC

## 2024-02-08 NOTE — Discharge Instructions (Addendum)
Take the antibiotic as prescribed.    A dental resource guide is attached.  Please call to make an appointment with a dentist as soon as possible.    Go to the emergency department if you have worsening symptoms.

## 2024-02-08 NOTE — Telephone Encounter (Signed)
 Patient called. Says he has started some antibiotics due to dental work. Wanted Dr. Addie to know.

## 2024-02-08 NOTE — ED Triage Notes (Signed)
 Patient to Urgent Care with complaints of left sided, lower dental pain. Some fevers.  Symptoms x4 days. Redness and swelling. No drainage but has had some bleeding through his tooth.  Taking aspirin .

## 2024-02-08 NOTE — ED Provider Notes (Signed)
 [name]    CSN: [id number] Arrival date & time: 02/08/24  9189      History   Chief Complaint Chief Complaint  Patient presents with   Dental Problem    HPI [name] is a 73 y.o. male.  Patient presents with 4-day history of left lower tooth pain at the site of a broken tooth with missing crown.  He has some swelling in his left lower jaw.  He reports possible subjective fever.  He has been treating his symptoms with aspirin .  No difficulty swallowing or breathing.  Patient states he has a dentist that he sees but has not yet scheduled an appointment.  The history is provided by the patient and medical records.    Past Medical History:  Diagnosis Date   Actinic keratoses    Angina    Asthma    BCC (basal cell carcinoma of skin) 09/06/2020   left upper back lateral (EDC) ,  left upper arm anterior (EDC), left upper back medial (EDC)    Bursitis of left elbow 2018   CAD (coronary artery disease)    Coronary artery disease of native artery of native heart with stable angina pectoris    Diabetes mellitus without complication [facility])    ED (erectile dysfunction)    Grade I diastolic dysfunction    Grade I diastolic dysfunction    History of heart artery stent    HTN (hypertension) 04/12/2011   Hyperlipemia 04/12/2011   Ischemic cardiomyopathy    a. echo 2010: EF 45-50%, mild to mod ant and apical wall HK, trace MR; b. cardiac cath 06/2012: EF 40%, mild MR    Multiple vessel coronary artery disease 04/12/2011   a. remote MI 1996; b. MI 1997 s/p PCI - LAD & diag; c. MI 2009: long stenting P-MLAD & Diag & POBA of jailed diag branch; d. cath 2010: 80% ISR of LAD w/ L-R collats, med Rx; e. cath 2012: 50-60% tub mLAD, 60-70% dLAD, FFR 0.75 -->s/p PCI dissection => w/ 2 stents (3 total); f. cath 06/2012: no changes from 2012 cath, med Rx, patent stents -> MV CAD 11/20/20 --> CABG X 5   Myocardial infarction (HCC)    x 5   Obesity    OSA (obstructive sleep apnea)     on CPAP   Osteoarthritis    Renal disorder    kidney stone   Uncontrolled type 2 diabetes mellitus with hyperglycemia, without long-term current use of insulin  (HCC)     Patient Active Problem List   Diagnosis Date Noted   Eustachian tube dysfunction, left 11/16/2023   Tinea versicolor 12/30/2022   Irregular heart beat 10/10/2022   Depression, major, single episode, mild 04/02/2022   Nicotine dependence 12/31/2021   Other and unspecified peripheral vertigo 12/31/2021   Neuropathy 12/31/2021   Anemia 11/11/2021   Left hip pain 07/02/2021   Hearing loss 07/02/2021   Foot cramps 03/20/2021   Anxiety 12/11/2020   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   Multiple vessel coronary artery disease 11/20/2020   Bone spicules of jaw 12/27/2018   Adrenal adenoma 03/03/2017   Right shoulder pain 03/03/2017   OSA (obstructive sleep apnea) 07/30/2016   Low back pain 05/15/2016   Actinic keratoses 02/07/2016   Right hip pain 02/07/2016   Ischemic cardiomyopathy    Uncontrolled type 2 diabetes mellitus with hyperglycemia, without long-term current use of insulin  (HCC) 11/30/2014   CAD s/p CABG (coronary artery disease) 04/12/2011   HTN (hypertension) 04/12/2011  Hyperlipemia 04/12/2011   Prostate cancer (HCC) 04/12/2011    Past Surgical History:  Procedure Laterality Date   CATARACT EXTRACTION W/PHACO Right 12/22/2023   Procedure: PHACOEMULSIFICATION, CATARACT, WITH IOL INSERTION 7.83, 00:55.5;  Surgeon: Jaye Fallow, MD;  Location: [facility];  Service: Ophthalmology;  Laterality: Right;   CATARACT EXTRACTION W/PHACO Left 01/05/2024   Procedure: PHACOEMULSIFICATION, CATARACT, WITH IOL INSERTION 8.65 01:06.1;  Surgeon: Jaye Fallow, MD;  Location: [facility];  Service: Ophthalmology;  Laterality: Left;   CORONARY ANGIOPLASTY     x 5   CORONARY ARTERY BYPASS GRAFT N/A 11/20/2020   Procedure: CORONARY ARTERY BYPASS GRAFTING (CABG) x  FIVE (LIMA-dLAD, SVG-PDA, SVG-D1,  SeqSVG-OM1-2) ON PUMP  USING LEFT INTERNAL MAMMARY ARTERTY AND LEFT ENDOSCOPIC GREATER SAPHENOUS VEIN CONDUITS, RIGHT LEG OPENED NOT HARVESTED;  Surgeon: German Bartlett PEDLAR, MD;  Location: [facility] OR;  Service: Open Heart Surgery;  Laterality: N/A;   CORONARY PRESSURE/FFR STUDY N/A 11/15/2020   Procedure: INTRAVASCULAR PRESSURE WIRE/FFR STUDY;  Surgeon: Mady Bruckner, MD;  Location: [facility] INVAS[facility] CV LAB;  Service: Cardiovascular;  Laterality: N/A;   CORONARY STENT PLACEMENT     x 7   LEFT HEART CATH AND CORONARY ANGIOGRAPHY N/A 11/15/2020   Procedure: LEFT HEART CATH AND CORONARY ANGIOGRAPHY;  Surgeon: Mady Bruckner, MD;  Location: [facility] INVAS[facility] CV LAB;  Service: Cardiovascular;  Laterality: N/A;   LEFT HEART CATHETERIZATION WITH CORONARY ANGIOGRAM N/A 04/14/2011   Procedure: LEFT HEART CATHETERIZATION WITH CORONARY ANGIOGRAM;  Surgeon: Vinie KYM Maxcy, MD;  Location: [facility] CATH LAB;  Service: Cardiovascular;  Laterality: N/A;   LEFT HEART CATHETERIZATION WITH CORONARY ANGIOGRAM N/A 06/25/2012   Procedure: LEFT HEART CATHETERIZATION WITH CORONARY ANGIOGRAM;  Surgeon: Peter M Swaziland, MD;  Location: [facility] CATH LAB;  Service: Cardiovascular;  Laterality: N/A;   TEE WITHOUT CARDIOVERSION N/A 11/20/2020   Procedure: TRANSESOPHAGEAL ECHOCARDIOGRAM (TEE);  Surgeon: German Bartlett PEDLAR, MD;  Location: [facility] OR;  Service: Open Heart Surgery;  Laterality: N/A;   TONSILLECTOMY AND ADENOIDECTOMY  1955       Home Medications    Prior to Admission medications   Medication Sig Start Date End Date Taking? Authorizing Provider  amoxicillin -clavulanate (AUGMENTIN ) 875-125 MG tablet Take 1 tablet by mouth every 12 (twelve) hours. 02/08/24  Yes Corlis Burnard DEL, NP  acetaminophen  (TYLENOL ) 500 MG tablet Take 2 tablets (1,000 mg total) by mouth every 6 (six) hours as needed. 08/18/23 08/17/24  Clarine Ozell LABOR, MD  aspirin  EC 81 MG tablet Take 1 tablet (81 mg total) by mouth daily. 01/07/24   Darron Deatrice LABOR, MD  atorvastatin   (LIPITOR ) 80 MG tablet Take 1 tablet (80 mg total) by mouth daily. 11/18/23   Narendra, Nischal, MD  blood glucose meter kit and supplies KIT Dispense based on patient and insurance preference. Use up to four times daily as directed. 10/25/21   Bryn Bernardino NOVAK, MD  carvedilol  (COREG ) 6.25 MG tablet Take 1 tablet (6.25 mg total) by mouth 2 (two) times daily with a meal. 05/04/23   Gollan, Evalene PARAS, MD  diphenhydramine -acetaminophen  (TYLENOL  PM) 25-500 MG TABS tablet Take 1 tablet by mouth at bedtime as needed.    [provider]  ezetimibe  (ZETIA ) 10 MG tablet TAKE 1 TABLET DAILY 08/20/23   Gollan, Timothy J, MD  furosemide  (LASIX ) 40 MG tablet Take 1 tablet (40 mg total) by mouth daily. 05/04/23   Gollan, Timothy J, MD  insulin  glargine (LANTUS ) 100 UNIT/ML Solostar Pen Inject 25 Units into the skin daily. 11/16/23  Narendra, Nischal, MD  Insulin  Pen Needle 31G X 5 MM MISC Inject insulin  via insulin  pen daily 10/25/21   Bryn Bernardino NOVAK, MD  isosorbide  mononitrate (IMDUR ) 30 MG 24 hr tablet Take 1 tablet (30 mg total) by mouth daily. 05/04/23   Gollan, Timothy J, MD  ketoconazole  (NIZORAL ) 2 % cream Apply twice daily to pink, scaly areas on face, chest, arms until improved. Patient taking differently: 2 (two) times daily as needed. Apply twice daily to pink, scaly areas on face, chest, arms until improved. 12/30/22   Claudene Lehmann, MD  losartan  (COZAAR ) 25 MG tablet Take 1 tablet (25 mg total) by mouth daily. 05/04/23   Gollan, Timothy J, MD  Magnesium  250 MG TABS Take 1 tablet by mouth daily. Patient taking differently: Take 1 tablet by mouth in the morning and at bedtime.    [provider]  metFORMIN  (GLUCOPHAGE ) 1000 MG tablet Take 1 tablet (1,000 mg total) by mouth 2 (two) times daily with a meal. 11/16/23   Onesimo Claude, MD  Multiple Vitamins-Minerals (MULTIVITAMINS THER. W/MINERALS) TABS Take 1 tablet by mouth every morning.    [provider]  niacin  (NIASPAN ) 1000 MG  CR tablet Take 2 tablets (2000 mg) by mouth once daily at bedtime 11/25/23   Gollan, Timothy J, MD  nitroGLYCERIN  (NITROLINGUAL ) 0.4 MG/SPRAY spray Place 1 spray under the tongue every 5 (five) minutes x 3 doses as needed for chest pain. 05/04/23   Gollan, Timothy J, MD  NON FORMULARY Take 1-2 tablets by mouth daily as needed (Pain). CBD Gummy    [provider]  potassium chloride  SA (KLOR-CON  M) 20 MEQ tablet Take 1 tablet (20 mEq total) by mouth daily. 05/04/23   Gollan, Timothy J, MD  Semaglutide , 2 MG/DOSE, 8 MG/3ML SOPN Inject 2 mg as directed once a week. 01/18/24   Onesimo Claude, MD    Family History Family History  Problem Relation Age of Onset   COPD Mother    Other Father        muscle tumor   Stroke Father    Prostate cancer Father        mets   Heart disease Father    Alcoholism Father    Stroke Sister    Heart block Sister        Heart bypass   Bladder Cancer Neg Hx    Kidney cancer Neg Hx     Social History Social History   Tobacco Use   Smoking status: Former    Current packs/day: 0.00    Average packs/day: 1.5 packs/day for 40.0 years (60.0 ttl pk-yrs)    Types: Cigarettes    Start date: 09/14/1967    Quit date: 09/14/2007    Years since quitting: 16.4   Smokeless tobacco: Never   Tobacco comments:    quit 2009  Vaping Use   Vaping status: Never Used  Substance Use Topics   Alcohol use: No    Alcohol/week: 0.0 standard drinks of alcohol    Comment: rarely 1 beer in last 5 years   Drug use: Not Currently    Types: Marijuana    Comment: pt stopped fall 2012     Allergies   Hydroxyzine hcl and Latex   Review of Systems Review of Systems  Constitutional:  Positive for fever. Negative for chills.  HENT:  Positive for dental problem and facial swelling. Negative for sore throat, trouble swallowing and voice change.   Respiratory:  Negative for cough and shortness of  breath.      Physical Exam Triage Vital Signs ED Triage Vitals   Encounter Vitals Group     BP      Girls Systolic BP Percentile      Girls Diastolic BP Percentile      Boys Systolic BP Percentile      Boys Diastolic BP Percentile      Pulse      Resp      Temp      Temp src      SpO2      Weight      Height      Head Circumference      Peak Flow      Pain Score      Pain Loc      Pain Education      Exclude from Growth Chart    No data found.  Updated Vital Signs BP (!) 145/78   Pulse (!) 102   Temp 98.1 F (36.7 C)   Resp 18   SpO2 97%   Visual Acuity Right Eye Distance:   Left Eye Distance:   Bilateral Distance:    Right Eye Near:   Left Eye Near:    Bilateral Near:     Physical Exam Constitutional:      General: He is not in acute distress. HENT:     Head:      Mouth/Throat:     Mouth: Mucous membranes are moist.     Dentition: Abnormal dentition. Dental tenderness, dental caries and dental abscesses present.   Cardiovascular:     Rate and Rhythm: Normal rate and regular rhythm.  Pulmonary:     Effort: Pulmonary effort is normal. No respiratory distress.  Neurological:     Mental Status: He is alert.      UC Treatments / Results  Labs (all labs ordered are listed, but only abnormal results are displayed) Labs Reviewed - No data to display  EKG   Radiology No results found.  Procedures Procedures (including critical care time)  Medications Ordered in UC Medications - No data to display  Initial Impression / Assessment and Plan / UC Course  I have reviewed the triage vital signs and the nursing notes.  Pertinent labs & imaging results that were available during my care of the patient were reviewed by me and considered in my medical decision making (see chart for details).    Dental abscess, facial swelling.  Afebrile and vital signs are stable.  Treating today with Augmentin .  Instructed patient to schedule an appointment with his dentist to soon as possible.  Dental resource guide provided.   Education provided on dental abscess.  ED precautions given.  Patient agrees to plan of care.  Final Clinical Impressions(s) / UC Diagnoses   Final diagnoses:  Dental abscess  Facial swelling     Discharge Instructions      Take the antibiotic as prescribed.    A dental resource guide is attached.  Please call to make an appointment with a dentist as soon as possible.    Go to the emergency department if you have worsening symptoms.         ED Prescriptions     Medication Sig Dispense Auth. Provider   amoxicillin -clavulanate (AUGMENTIN ) 875-125 MG tablet Take 1 tablet by mouth every 12 (twelve) hours. 14 tablet Corlis Burnard DEL, NP      PDMP not reviewed this encounter.   Corlis Burnard DEL, NP 02/08/24 [phone]

## 2024-02-12 ENCOUNTER — Telehealth: Payer: Self-pay | Admitting: Orthopedic Surgery

## 2024-02-12 NOTE — Telephone Encounter (Signed)
 Patient is scheduled for right shoulder arthroplasty 02-16-24.  Gayle with pre-admission scheduling called stating patient had called our office Monday to say he had started antibiotics to treat a dental abscess and wanted Dr Addie to know.  Patient went to [facility] 02-08-24.  See notes from the visit below:   Korrey Schleicher is a 73 y.o. male.  Patient presents with 4-day history of left lower tooth pain at the site of a broken tooth with missing crown.  He has some swelling in his left lower jaw.  He reports possible subjective fever.  He has been treating his symptoms with aspirin .  No difficulty swallowing or breathing.  Patient states he has a dentist that he sees but has not yet scheduled an appointment.   I returned the patient's call and offered to move the surgery date to our first opening on 03-08-24 giving him time to handle the dental work, complete the antibiotics, and be cleared by the dentist.    Patient states he is not planning to take care of any dental work until after the surgery and does not currently know who will do the work because he is still shopping for a dentist.  I did explain Dr. Addie will want him to complete the dental work PRIOR to the shoulder surgery because the risk of infection making its way through the bloodstream poses a threat to the implant.  Patient said he would not move the surgery from the postponed date of 03-08-24, nor would he do the dental work before the surgery and if Dr. Addie will not do the surgery he will find someone else to do it.  Patient said he is very frustrated and doesn't like what he is hearing.  He is requesting a call from the doctor today.

## 2024-02-15 ENCOUNTER — Ambulatory Visit
Admission: EM | Admit: 2024-02-15 | Discharge: 2024-02-15 | Disposition: A | Discharge status: Home / Self Care | Attending: Emergency Medicine | Admitting: Emergency Medicine

## 2024-02-15 ENCOUNTER — Other Ambulatory Visit (HOSPITAL_COMMUNITY)

## 2024-02-15 ENCOUNTER — Encounter: Payer: Self-pay | Admitting: Emergency Medicine

## 2024-02-15 DIAGNOSIS — R220 Localized swelling, mass and lump, head: Secondary | ICD-10-CM

## 2024-02-15 DIAGNOSIS — H6502 Acute serous otitis media, left ear: Secondary | ICD-10-CM | POA: Diagnosis not present

## 2024-02-15 MED ORDER — CEFDINIR 300 MG PO CAPS: 300.0000 mg | ORAL_CAPSULE | Freq: Two times a day (BID) | ORAL | 0 refills | Status: DC

## 2024-02-15 MED ORDER — PREDNISONE 10 MG (21) PO TBPK: ORAL_TABLET | Freq: Every day | ORAL | 0 refills | Status: DC

## 2024-02-15 NOTE — Telephone Encounter (Signed)
 I called.  He is going to hold off on surgery for now until he gets the dental issue resolved and he will call us  when he has that resolved.  Hold off on any scheduling for now.  Thanks

## 2024-02-15 NOTE — ED Triage Notes (Signed)
 Patient reports left ear pain and left lower dental pain. Patient states he had been taking his antibiotics. Rates pain 3/10.

## 2024-02-15 NOTE — ED Provider Notes (Signed)
 [name]    CSN: [id number] Arrival date & time: 02/15/24  9167      History   Chief Complaint Chief Complaint  Patient presents with   Otalgia   Dental Pain    HPI [name] is a 73 y.o. male.   Patient presents for evaluation of worsening left ear pain initially beginning 11 days ago with onset of left facial swelling and dental pain with bloody drainage.  Endorses ear pain has worsened over the last 3 to 4 days but facial swelling has improved and drainage has subsided.  Has been experiencing subjective fever with last occurrence 2 days ago.  Has had decreased appetite but tolerable to some food and liquids.  Has not attempted treatment additionally.  Seen in this urgent care for same symptoms 7 days ago, has been taking Augmentin .  Past Medical History:  Diagnosis Date   Actinic keratoses    Angina    Asthma    BCC (basal cell carcinoma of skin) 09/06/2020   left upper back lateral (EDC) ,  left upper arm anterior (EDC), left upper back medial (EDC)    Bursitis of left elbow 2018   CAD (coronary artery disease)    Coronary artery disease of native artery of native heart with stable angina pectoris    Diabetes mellitus without complication [facility])    ED (erectile dysfunction)    Grade I diastolic dysfunction    Grade I diastolic dysfunction    History of heart artery stent    HTN (hypertension) 04/12/2011   Hyperlipemia 04/12/2011   Ischemic cardiomyopathy    a. echo 2010: EF 45-50%, mild to mod ant and apical wall HK, trace MR; b. cardiac cath 06/2012: EF 40%, mild MR    Multiple vessel coronary artery disease 04/12/2011   a. remote MI 1996; b. MI 1997 s/p PCI - LAD & diag; c. MI 2009: long stenting P-MLAD & Diag & POBA of jailed diag branch; d. cath 2010: 80% ISR of LAD w/ L-R collats, med Rx; e. cath 2012: 50-60% tub mLAD, 60-70% dLAD, FFR 0.75 -->s/p PCI dissection => w/ 2 stents (3 total); f. cath 06/2012: no changes from 2012 cath, med Rx, patent  stents -> MV CAD 11/20/20 --> CABG X 5   Myocardial infarction (HCC)    x 5   Obesity    OSA (obstructive sleep apnea)    on CPAP   Osteoarthritis    Renal disorder    kidney stone   Uncontrolled type 2 diabetes mellitus with hyperglycemia, without long-term current use of insulin  [facility])     Patient Active Problem List   Diagnosis Date Noted   Eustachian tube dysfunction, left 11/16/2023   Tinea versicolor 12/30/2022   Irregular heart beat 10/10/2022   Depression, major, single episode, mild 04/02/2022   Nicotine dependence 12/31/2021   Other and unspecified peripheral vertigo 12/31/2021   Neuropathy 12/31/2021   Anemia 11/11/2021   Left hip pain 07/02/2021   Hearing loss 07/02/2021   Foot cramps 03/20/2021   Anxiety 12/11/2020   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   Multiple vessel coronary artery disease 11/20/2020   Bone spicules of jaw 12/27/2018   Adrenal adenoma 03/03/2017   Right shoulder pain 03/03/2017   OSA (obstructive sleep apnea) 07/30/2016   Low back pain 05/15/2016   Actinic keratoses 02/07/2016   Right hip pain 02/07/2016   Ischemic cardiomyopathy    Uncontrolled type 2 diabetes mellitus with hyperglycemia, without long-term current use of  insulin  (HCC) 11/30/2014   CAD s/p CABG (coronary artery disease) 04/12/2011   HTN (hypertension) 04/12/2011   Hyperlipemia 04/12/2011   Prostate cancer (HCC) 04/12/2011    Past Surgical History:  Procedure Laterality Date   CATARACT EXTRACTION W/PHACO Right 12/22/2023   Procedure: PHACOEMULSIFICATION, CATARACT, WITH IOL INSERTION 7.83, 00:55.5;  Surgeon: Jaye Fallow, MD;  Location: [facility];  Service: Ophthalmology;  Laterality: Right;   CATARACT EXTRACTION W/PHACO Left 01/05/2024   Procedure: PHACOEMULSIFICATION, CATARACT, WITH IOL INSERTION 8.65 01:06.1;  Surgeon: Jaye Fallow, MD;  Location: [facility];  Service: Ophthalmology;  Laterality: Left;   CORONARY ANGIOPLASTY     x 5   CORONARY ARTERY  BYPASS GRAFT N/A 11/20/2020   Procedure: CORONARY ARTERY BYPASS GRAFTING (CABG) x  FIVE (LIMA-dLAD, SVG-PDA, SVG-D1, SeqSVG-OM1-2) ON PUMP  USING LEFT INTERNAL MAMMARY ARTERTY AND LEFT ENDOSCOPIC GREATER SAPHENOUS VEIN CONDUITS, RIGHT LEG OPENED NOT HARVESTED;  Surgeon: German Bartlett PEDLAR, MD;  Location: [facility] OR;  Service: Open Heart Surgery;  Laterality: N/A;   CORONARY PRESSURE/FFR STUDY N/A 11/15/2020   Procedure: INTRAVASCULAR PRESSURE WIRE/FFR STUDY;  Surgeon: Mady Bruckner, MD;  Location: [facility] INVAS[facility] CV [facility];  Service: Cardiovascular;  Laterality: N/A;   CORONARY STENT PLACEMENT     x 7   LEFT HEART CATH AND CORONARY ANGIOGRAPHY N/A 11/15/2020   Procedure: LEFT HEART CATH AND CORONARY ANGIOGRAPHY;  Surgeon: Mady Bruckner, MD;  Location: [facility] CV [facility];  Service: Cardiovascular;  Laterality: N/A;   LEFT HEART CATHETERIZATION WITH CORONARY ANGIOGRAM N/A 04/14/2011   Procedure: LEFT HEART CATHETERIZATION WITH CORONARY ANGIOGRAM;  Surgeon: Vinie KYM Maxcy, MD;  Location: [facility] CATH LAB;  Service: Cardiovascular;  Laterality: N/A;   LEFT HEART CATHETERIZATION WITH CORONARY ANGIOGRAM N/A 06/25/2012   Procedure: LEFT HEART CATHETERIZATION WITH CORONARY ANGIOGRAM;  Surgeon: Peter M Swaziland, MD;  Location: [facility] CATH [facility];  Service: Cardiovascular;  Laterality: N/A;   TEE WITHOUT CARDIOVERSION N/A 11/20/2020   Procedure: TRANSESOPHAGEAL ECHOCARDIOGRAM (TEE);  Surgeon: German Bartlett PEDLAR, MD;  [facility];  Service: Open Heart Surgery;  Laterality: N/A;   TONSILLECTOMY AND ADENOIDECTOMY  1955       Home Medications    Prior to Admission medications   Medication Sig Start Date End Date Taking? Authorizing Provider  cefdinir (OMNICEF) 300 MG capsule Take 1 capsule (300 mg total) by mouth 2 (two) times daily. 02/15/24  Yes Marquesha Robideau R, NP  predniSONE (STERAPRED UNI-PAK 21 TAB) 10 MG (21) TBPK tablet Take by mouth daily. Take 6 tabs by mouth daily  for 1 days, then 5 tabs for 1 days, then 4  tabs for 1 days, then 3 tabs for 1 days, 2 tabs for 1 days, then 1 tab by mouth daily for 1 days 02/15/24  Yes Mainor Hellmann R, NP  acetaminophen  (TYLENOL ) 500 MG tablet Take 2 tablets (1,000 mg total) by mouth every 6 (six) hours as needed. 08/18/23 08/17/24  Clarine Ozell LABOR, MD  amoxicillin -clavulanate (AUGMENTIN ) 875-125 MG tablet Take 1 tablet by mouth every 12 (twelve) hours. 02/08/24   Corlis Burnard DEL, NP  aspirin  EC 81 MG tablet Take 1 tablet (81 mg total) by mouth daily. 01/07/24   Darron Deatrice LABOR, MD  atorvastatin  (LIPITOR ) 80 MG tablet Take 1 tablet (80 mg total) by mouth daily. 11/18/23   Narendra, Nischal, MD  blood glucose meter kit and supplies KIT Dispense based on patient and insurance preference. Use up to four times daily as directed. 10/25/21   Bryn Bernardino NOVAK,  MD  carvedilol  (COREG ) 6.25 MG tablet Take 1 tablet (6.25 mg total) by mouth 2 (two) times daily with a meal. 05/04/23   Gollan, Timothy J, MD  diphenhydramine -acetaminophen  (TYLENOL  PM) 25-500 MG TABS tablet Take 1 tablet by mouth at bedtime as needed.    [provider]  ezetimibe  (ZETIA ) 10 MG tablet TAKE 1 TABLET DAILY 08/20/23   Gollan, Timothy J, MD  furosemide  (LASIX ) 40 MG tablet Take 1 tablet (40 mg total) by mouth daily. 05/04/23   Gollan, Timothy J, MD  insulin  glargine (LANTUS ) 100 UNIT/ML Solostar Pen Inject 25 Units into the skin daily. 11/16/23   Narendra, Nischal, MD  Insulin  Pen Needle 31G X 5 MM MISC Inject insulin  via insulin  pen daily 10/25/21   Bryn Bernardino NOVAK, MD  isosorbide  mononitrate (IMDUR ) 30 MG 24 hr tablet Take 1 tablet (30 mg total) by mouth daily. 05/04/23   Gollan, Timothy J, MD  ketoconazole  (NIZORAL ) 2 % cream Apply twice daily to pink, scaly areas on face, chest, arms until improved. Patient taking differently: Apply 1 Application topically 2 (two) times daily as needed for irritation. Apply twice daily to pink, scaly areas on face, chest, arms until improved. 12/30/22   Claudene Lehmann, MD   losartan  (COZAAR ) 25 MG tablet Take 1 tablet (25 mg total) by mouth daily. 05/04/23   Gollan, Timothy J, MD  Magnesium  250 MG TABS Take 1 tablet by mouth in the morning and at bedtime.    [provider]  metFORMIN  (GLUCOPHAGE ) 1000 MG tablet Take 1 tablet (1,000 mg total) by mouth 2 (two) times daily with a meal. 11/16/23   Onesimo Claude, MD  Multiple Vitamins-Minerals (MULTIVITAMINS THER. W/MINERALS) TABS Take 1 tablet by mouth every morning.    [provider]  niacin  (NIASPAN ) 1000 MG CR tablet Take 2 tablets (2000 mg) by mouth once daily at bedtime 11/25/23   Gollan, Timothy J, MD  nitroGLYCERIN  (NITROLINGUAL ) 0.4 MG/SPRAY spray Place 1 spray under the tongue every 5 (five) minutes x 3 doses as needed for chest pain. 05/04/23   Gollan, Timothy J, MD  potassium chloride  SA (KLOR-CON  M) 20 MEQ tablet Take 1 tablet (20 mEq total) by mouth daily. 05/04/23   Gollan, Timothy J, MD  Semaglutide , 2 MG/DOSE, 8 MG/3ML SOPN Inject 2 mg as directed once a week. 01/18/24   Onesimo Claude, MD    Family History Family History  Problem Relation Age of Onset   COPD Mother    Other Father        muscle tumor   Stroke Father    Prostate cancer Father        mets   Heart disease Father    Alcoholism Father    Stroke Sister    Heart block Sister        Heart bypass   Bladder Cancer Neg Hx    Kidney cancer Neg Hx     Social History Social History   Tobacco Use   Smoking status: Former    Current packs/day: 0.00    Average packs/day: 1.5 packs/day for 40.0 years (60.0 ttl pk-yrs)    Types: Cigarettes    Start date: 09/14/1967    Quit date: 09/14/2007    Years since quitting: 16.4   Smokeless tobacco: Never   Tobacco comments:    quit 2009  Vaping Use   Vaping status: Never Used  Substance Use Topics   Alcohol use: No    Alcohol/week: 0.0 standard drinks of alcohol  Comment: rarely 1 beer in last 5 years   Drug use: Not Currently    Types: Marijuana    Comment: pt  stopped fall 2012     Allergies   Hydroxyzine hcl and Latex   Review of Systems Review of Systems   Physical Exam Triage Vital Signs ED Triage Vitals  Encounter Vitals Group     BP 02/15/24 0841 130/70     Girls Systolic BP Percentile --      Girls Diastolic BP Percentile --      Boys Systolic BP Percentile --      Boys Diastolic BP Percentile --      Pulse Rate 02/15/24 0841 86     Resp 02/15/24 0841 18     Temp 02/15/24 0841 97.8 F (36.6 C)     Temp Source 02/15/24 0841 Oral     SpO2 02/15/24 0841 96 %     Weight --      Height --      Head Circumference --      Peak Flow --      Pain Score 02/15/24 0838 3     Pain Loc --      Pain Education --      Exclude from Growth Chart --    No data found.  Updated Vital Signs BP 130/70 (BP Location: Left Arm)   Pulse 86   Temp 97.8 F (36.6 C) (Oral)   Resp 18   SpO2 96%   Visual Acuity Right Eye Distance:   Left Eye Distance:   Bilateral Distance:    Right Eye Near:   Left Eye Near:    Bilateral Near:     Physical Exam Constitutional:      Appearance: Normal appearance.  HENT:     Head:     Comments: Mild left cheek swelling, no abscess noted    Right Ear: Hearing, tympanic membrane, ear canal and external ear normal.     Left Ear: Tympanic membrane is erythematous.  Eyes:     Extraocular Movements: Extraocular movements intact.  Pulmonary:     Effort: Pulmonary effort is normal.  Neurological:     Mental Status: He is alert and oriented to person, place, and time.      UC Treatments / Results  Labs (all labs ordered are listed, but only abnormal results are displayed) Labs Reviewed - No data to display  EKG   Radiology No results found.  Procedures Procedures (including critical care time)  Medications Ordered in UC Medications - No data to display  Initial Impression / Assessment and Plan / UC Course  I have reviewed the triage vital signs and the nursing notes.  Pertinent labs  & imaging results that were available during my care of the patient were reviewed by me and considered in my medical decision making (see chart for details).  Nonrecurrent acute serous otitis media of the left ear, left facial swelling  Presentation of the ear is consistent with infection as there is erythema present to the tympanic membrane, discussed findings with patient, dental symptoms improving, completing antibiotic today, due to new infection initiating additional antibiotics, cefdinir prescribed, additionally prescribed prednisone, last A1c 6.1, history of diabetes, controlled, advised to monitor closely and adjust insulin  as needed, recommended supportive care and advised follow-up if symptoms continue to persist or worsen Final Clinical Impressions(s) / UC Diagnoses   Final diagnoses:  Non-recurrent acute serous otitis media of left ear  Left facial swelling  Discharge Instructions      Today you are evaluated for ear pain and facial swelling, on exam the ear appears to becoming infected and therefore will extend your antibiotic course  Take cefdinir twice daily for 7 days for treatment of the ear infection and to finish clearing of any dental related germs  Begin prednisone every morning with food as directed to help reduce inflammation and help with pain, avoid ibuprofen while taking you may use Tylenol , this will temporarily increase your blood sugar, monitor closely  Until you are able to eat is normal increase your fluid intake to maintain your hydration with food as tolerable  May hold warm compresses to the outer ear  Avoid ear cleaning during treatment to prevent further irritation  If you continue to have symptoms please follow-up for reevaluation   ED Prescriptions     Medication Sig Dispense Auth. Provider   cefdinir (OMNICEF) 300 MG capsule Take 1 capsule (300 mg total) by mouth 2 (two) times daily. 14 capsule Alexxander Kurt R, NP   predniSONE (STERAPRED  UNI-PAK 21 TAB) 10 MG (21) TBPK tablet Take by mouth daily. Take 6 tabs by mouth daily  for 1 days, then 5 tabs for 1 days, then 4 tabs for 1 days, then 3 tabs for 1 days, 2 tabs for 1 days, then 1 tab by mouth daily for 1 days 21 tablet Milik Gilreath, Shelba SAUNDERS, NP      PDMP not reviewed this encounter.   Teresa Shelba SAUNDERS, [place] 02/15/24 [phone]

## 2024-02-15 NOTE — Discharge Instructions (Signed)
 Today you are evaluated for ear pain and facial swelling, on exam the ear appears to becoming infected and therefore will extend your antibiotic course  Take cefdinir twice daily for 7 days for treatment of the ear infection and to finish clearing of any dental related germs  Begin prednisone every morning with food as directed to help reduce inflammation and help with pain, avoid ibuprofen while taking you may use Tylenol , this will temporarily increase your blood sugar, monitor closely  Until you are able to eat is normal increase your fluid intake to maintain your hydration with food as tolerable  May hold warm compresses to the outer ear  Avoid ear cleaning during treatment to prevent further irritation  If you continue to have symptoms please follow-up for reevaluation

## 2024-02-16 ENCOUNTER — Encounter (HOSPITAL_COMMUNITY): Admission: RE | Payer: Self-pay | Source: Home / Self Care

## 2024-02-16 ENCOUNTER — Ambulatory Visit (HOSPITAL_COMMUNITY): Admission: RE | Admit: 2024-02-16 | Source: Home / Self Care | Admitting: Orthopedic Surgery

## 2024-02-16 DIAGNOSIS — Z01818 Encounter for other preprocedural examination: Secondary | ICD-10-CM

## 2024-02-16 SURGERY — ARTHROPLASTY, SHOULDER, TOTAL, REVERSE
Anesthesia: General | Site: Shoulder | Laterality: Right

## 2024-02-17 ENCOUNTER — Encounter: Admitting: Nurse Practitioner

## 2024-02-23 ENCOUNTER — Ambulatory Visit
Admission: RE | Admit: 2024-02-23 | Discharge: 2024-02-23 | Disposition: A | Discharge status: Home / Self Care | Source: Ambulatory Visit | Attending: Urology | Admitting: Urology

## 2024-02-23 DIAGNOSIS — R972 Elevated prostate specific antigen [PSA]: Secondary | ICD-10-CM | POA: Insufficient documentation

## 2024-02-23 DIAGNOSIS — N135 Crossing vessel and stricture of ureter without hydronephrosis: Secondary | ICD-10-CM | POA: Diagnosis not present

## 2024-02-23 DIAGNOSIS — C61 Malignant neoplasm of prostate: Secondary | ICD-10-CM | POA: Insufficient documentation

## 2024-02-23 MED ORDER — FLOTUFOLASTAT F 18 GALLIUM 296-5846 MBQ/ML IV SOLN
8.1400 | Freq: Once | INTRAVENOUS | Status: AC
  Administered 2024-02-23: 8.14 via INTRAVENOUS
  Filled 2024-02-23: qty 9

## 2024-03-02 ENCOUNTER — Encounter: Admitting: Orthopedic Surgery

## 2024-03-13 NOTE — Progress Notes (Deleted)
 NO SHOW

## 2024-03-14 ENCOUNTER — Ambulatory Visit: Attending: Cardiovascular Disease | Admitting: Cardiovascular Disease

## 2024-03-14 ENCOUNTER — Encounter: Payer: Self-pay | Admitting: Radiology

## 2024-03-14 DIAGNOSIS — R002 Palpitations: Secondary | ICD-10-CM

## 2024-03-14 DIAGNOSIS — I5022 Chronic systolic (congestive) heart failure: Secondary | ICD-10-CM

## 2024-03-14 DIAGNOSIS — E785 Hyperlipidemia, unspecified: Secondary | ICD-10-CM

## 2024-03-14 DIAGNOSIS — I10 Essential (primary) hypertension: Secondary | ICD-10-CM

## 2024-03-14 DIAGNOSIS — I739 Peripheral vascular disease, unspecified: Secondary | ICD-10-CM

## 2024-03-14 DIAGNOSIS — E1165 Type 2 diabetes mellitus with hyperglycemia: Secondary | ICD-10-CM

## 2024-03-14 DIAGNOSIS — G4733 Obstructive sleep apnea (adult) (pediatric): Secondary | ICD-10-CM

## 2024-03-14 DIAGNOSIS — I25810 Atherosclerosis of coronary artery bypass graft(s) without angina pectoris: Secondary | ICD-10-CM

## 2024-03-17 ENCOUNTER — Other Ambulatory Visit: Payer: Self-pay

## 2024-03-17 DIAGNOSIS — C61 Malignant neoplasm of prostate: Secondary | ICD-10-CM

## 2024-03-23 ENCOUNTER — Encounter: Payer: Self-pay | Admitting: Nurse Practitioner

## 2024-03-23 ENCOUNTER — Ambulatory Visit: Attending: Nurse Practitioner | Admitting: Nurse Practitioner

## 2024-03-23 VITALS — BP 124/70 | HR 89 | Ht 68.0 in | Wt 193.0 lb

## 2024-03-23 DIAGNOSIS — I255 Ischemic cardiomyopathy: Secondary | ICD-10-CM | POA: Diagnosis not present

## 2024-03-23 DIAGNOSIS — R002 Palpitations: Secondary | ICD-10-CM | POA: Insufficient documentation

## 2024-03-23 DIAGNOSIS — I5020 Unspecified systolic (congestive) heart failure: Secondary | ICD-10-CM | POA: Insufficient documentation

## 2024-03-23 DIAGNOSIS — E785 Hyperlipidemia, unspecified: Secondary | ICD-10-CM | POA: Insufficient documentation

## 2024-03-23 DIAGNOSIS — I10 Essential (primary) hypertension: Secondary | ICD-10-CM | POA: Diagnosis not present

## 2024-03-23 DIAGNOSIS — Z794 Long term (current) use of insulin: Secondary | ICD-10-CM | POA: Diagnosis not present

## 2024-03-23 DIAGNOSIS — E118 Type 2 diabetes mellitus with unspecified complications: Secondary | ICD-10-CM | POA: Diagnosis not present

## 2024-03-23 DIAGNOSIS — I739 Peripheral vascular disease, unspecified: Secondary | ICD-10-CM | POA: Diagnosis not present

## 2024-03-23 DIAGNOSIS — I25709 Atherosclerosis of coronary artery bypass graft(s), unspecified, with unspecified angina pectoris: Secondary | ICD-10-CM | POA: Diagnosis not present

## 2024-03-23 DIAGNOSIS — R072 Precordial pain: Secondary | ICD-10-CM | POA: Insufficient documentation

## 2024-03-23 DIAGNOSIS — G4733 Obstructive sleep apnea (adult) (pediatric): Secondary | ICD-10-CM | POA: Insufficient documentation

## 2024-03-23 NOTE — Patient Instructions (Signed)
 Medication Instructions:  Your physician recommends that you continue on your current medications as directed. Please refer to the Current Medication list given to you today.   *If you need a refill on your cardiac medications before your next appointment, please call your pharmacy*  Lab Work: None ordered at this time  If you have labs (blood work) drawn today and your tests are completely normal, you will receive your results only by: MyChart Message (if you have MyChart) OR A paper copy in the mail If you have any lab test that is abnormal or we need to change your treatment, we will call you to review the results.  Testing/Procedures:    Please report to Radiology at the [facility] Main Entrance 30 minutes early for your test.  [address] [place], [place] [address]                         OR   Please report to Radiology at [facility] Main Entrance, medical mall, 30 mins prior to your test.  [address]  [place], [place]  How to Prepare for Your Cardiac PET/CT Stress Test:  Nothing to eat or drink, except water , 3 hours prior to arrival time.  NO caffeine/decaffeinated products, or chocolate 12 hours prior to arrival. (Please note decaffeinated beverages (teas/coffees) still contain caffeine).  If you have caffeine within 12 hours prior, the test will need to be rescheduled.  Medication instructions: Do not take erectile dysfunction medications for 72 hours prior to test (sildenafil , tadalafil) Do not take nitrates (isosorbide  mononitrate, Ranexa ) the day before or day of test Do not take tamsulosin  the day before or morning of test Hold theophylline containing medications for 12 hours. Hold Dipyridamole 48 hours prior to the test.  Diabetic Preparation: If able to eat breakfast prior to 3 hour fasting, you may take all medications, including your insulin . Do not worry if you miss your breakfast dose of insulin  - start at your  next meal. If you do not eat prior to 3 hour fast-Hold all diabetes (oral and insulin ) medications. Patients who wear a continuous glucose monitor MUST remove the device prior to scanning.  You may take your remaining medications with water .  NO perfume, cologne or lotion on chest or abdomen area. FEMALES - Please avoid wearing dresses to this appointment.  Total time is 1 to 2 hours; you may want to bring reading material for the waiting time.  IF YOU THINK YOU MAY BE PREGNANT, OR ARE NURSING PLEASE INFORM THE TECHNOLOGIST.  In preparation for your appointment, medication and supplies will be purchased.  Appointment availability is limited, so if you need to cancel or reschedule, please call the Radiology Department Scheduler at [phone] 24 hours in advance to avoid a cancellation fee of $100.00  What to Expect When you Arrive:  Once you arrive and check in for your appointment, you will be taken to a preparation room within the Radiology Department.  A technologist or Nurse will obtain your medical history, verify that you are correctly prepped for the exam, and explain the procedure.  Afterwards, an IV will be started in your arm and electrodes will be placed on your skin for EKG monitoring during the stress portion of the exam. Then you will be escorted to the PET/CT scanner.  There, staff will get you positioned on the scanner and obtain a blood pressure and EKG.  During the exam, you  will continue to be connected to the EKG and blood pressure machines.  A small, safe amount of a radioactive tracer will be injected in your IV to obtain a series of pictures of your heart along with an injection of a stress agent.    After your Exam:  It is recommended that you eat a meal and drink a caffeinated beverage to counter act any effects of the stress agent.  Drink plenty of fluids for the remainder of the day and urinate frequently for the first couple of hours after the exam.  Your doctor will  inform you of your test results within 7-10 business days.  For more information and frequently asked questions, please visit our website: [email or web address]  For questions about your test or how to prepare for your test, please call: Cardiac Imaging Nurse Navigators Office: [phone]   Follow-Up: At [facility], you and your health needs are our priority.  As part of our continuing mission to provide you with exceptional heart care, our providers are all part of one team.  This team includes your primary Cardiologist (physician) and Advanced Practice Providers or APPs (Physician Assistants and Nurse Practitioners) who all work together to provide you with the care you need, when you need it.  Your next appointment:   1 month(s)  Provider:   Deatrice Cage, MD for PV schedule  We recommend signing up for the patient portal called MyChart.  Sign up information is provided on this After Visit Summary.  MyChart is used to connect with patients for Virtual Visits (Telemedicine).  Patients are able to view lab/test results, encounter notes, upcoming appointments, etc.  Non-urgent messages can be sent to your provider as well.   To learn more about what you can do with MyChart, go to [email or web address].

## 2024-03-23 NOTE — Progress Notes (Signed)
 Office Visit    Patient Name: [name] Date of Encounter: 03/23/2024  Primary Care Provider:  Pccm, Fenton, MD Primary Cardiologist:  Evalene Lunger, MD    Chief Complaint    73 y.o. male with a history of CAD status post multiple PCI's and CABG (2022), chronic heart failure with midrange ejection fraction, primary hypertension, hyperlipidemia, sleep apnea, type 2 diabetes mellitus, palpitations, PSVT, PACs, peripheral arterial disease, remote tobacco abuse, and prostate cancer, who presents for follow-up related to chest pain and claudication.  Past Medical History   Subjective   Past Medical History:  Diagnosis Date   Actinic keratoses    Angina    Asthma    BCC (basal cell carcinoma of skin) 09/06/2020   left upper back lateral (EDC) ,  left upper arm anterior (EDC), left upper back medial (EDC)    Bursitis of left elbow 2018   Diabetes mellitus without complication [facility])    ED (erectile dysfunction)    Grade I diastolic dysfunction    Heart failure with mid-range ejection fraction (HCC)    a. 2010 Echo: EF 45-50%; b. 06/2012 LV Gram: EF 40%; c. 11/2020 LV Gram: EF 25-35%; d. 04/2021 Echo: EF 40-45%.   History of heart artery stent    HTN (hypertension) 04/12/2011   Hyperlipemia 04/12/2011   Ischemic cardiomyopathy    a. echo 2010: EF 45-50%, mild to mod ant and apical wall HK, trace MR; b. cardiac cath 06/2012: EF 40%, mild MR; c. 11/2020 LV Gram: EF 25-35%; d. 04/2021 Echo: EF 40-45%, ant, mid-apical antsept, apical HK. GrI DD. Nl RV fxn. RVSP 44.25mmHg. Mod dil LA. Mild-mod MR.   Mitral regurgitation    a. 04/2021 Echo: Mild-mod MR.   Multiple vessel coronary artery disease    a. remote MI 1996; b. MI 1997 s/p PCI - LAD & diag; c. MI 2009: long stenting P-MLAD & Diag & POBA of jailed diag branch; d. cath 2010: 80% ISR of LAD w/ L-R collats, med Rx; e. cath 2012: 50-60% tub mLAD, 60-70% dLAD, FFR 0.75 -->s/p PCI dissection => w/ 2 stents (3 total); f.  cath 06/2012: no changes from 2012-> med Rx; g. 11/2020 Cath: MVD-> CABG X 5 (LIMA->LAD, VG->RPDA, VG->OM1->OM2, VG->D1).   Myocardial infarction (HCC)    x 5   Nephrolithiasis    Obesity    OSA (obstructive sleep apnea)    on CPAP   Osteoarthritis    PAD (peripheral artery disease)    a. 11/2023 ABIs/TBIs: R 0.72/0.55, L 1.27/1.03.  Duplex - total occlusion of R SFA.   Premature atrial contractions    a. 10/2023 Zio: 13.2% PAC burden.   PSVT (paroxysmal supraventricular tachycardia)    a. 10/2023 Zio: 717 runs of SVT lasting up to 2 minutes and 19 seconds, with a max rate of 182 bpm.  He was also noted to have a 13.2% PAC burden.  Triggered events were associated with PACs and SVT.   Stable angina    Uncontrolled type 2 diabetes mellitus with hyperglycemia, without long-term current use of insulin  [facility])    Past Surgical History:  Procedure Laterality Date   CATARACT EXTRACTION W/PHACO Right 12/22/2023   Procedure: PHACOEMULSIFICATION, CATARACT, WITH IOL INSERTION 7.83, 00:55.5;  Surgeon: Jaye Fallow, MD;  Location: [facility];  Service: Ophthalmology;  Laterality: Right;   CATARACT EXTRACTION W/PHACO Left 01/05/2024   Procedure: PHACOEMULSIFICATION, CATARACT, WITH IOL INSERTION 8.65 01:06.1;  Surgeon: Jaye Fallow, MD;  Location: [facility];  Service: Ophthalmology;  Laterality: Left;   CORONARY ANGIOPLASTY     x 5   CORONARY ARTERY BYPASS GRAFT N/A 11/20/2020   Procedure: CORONARY ARTERY BYPASS GRAFTING (CABG) x  FIVE (LIMA-dLAD, SVG-PDA, SVG-D1, SeqSVG-OM1-2) ON PUMP  USING LEFT INTERNAL MAMMARY ARTERTY AND LEFT ENDOSCOPIC GREATER SAPHENOUS VEIN CONDUITS, RIGHT LEG OPENED NOT HARVESTED;  Surgeon: German Bartlett PEDLAR, MD;  Location: [facility] OR;  Service: Open Heart Surgery;  Laterality: N/A;   CORONARY PRESSURE/FFR STUDY N/A 11/15/2020   Procedure: INTRAVASCULAR PRESSURE WIRE/FFR STUDY;  Surgeon: Mady Bruckner, MD;  Location: [facility] INVAS[facility] CV LAB;  Service:  Cardiovascular;  Laterality: N/A;   CORONARY STENT PLACEMENT     x 7   LEFT HEART CATH AND CORONARY ANGIOGRAPHY N/A 11/15/2020   Procedure: LEFT HEART CATH AND CORONARY ANGIOGRAPHY;  Surgeon: Mady Bruckner, MD;  Location: [facility]VAS[facility] CV LAB;  Service: Cardiovascular;  Laterality: N/A;   LEFT HEART CATHETERIZATION WITH CORONARY ANGIOGRAM N/A 04/14/2011   Procedure: LEFT HEART CATHETERIZATION WITH CORONARY ANGIOGRAM;  Surgeon: Vinie KYM Maxcy, MD;  Location: [facility] CATH LAB;  Service: Cardiovascular;  Laterality: N/A;   LEFT HEART CATHETERIZATION WITH CORONARY ANGIOGRAM N/A 06/25/2012   Procedure: LEFT HEART CATHETERIZATION WITH CORONARY ANGIOGRAM;  Surgeon: Peter M Jordan, MD;  Location: [facility] CATH LAB;  Service: Cardiovascular;  Laterality: N/A;   TEE WITHOUT CARDIOVERSION N/A 11/20/2020   Procedure: TRANSESOPHAGEAL ECHOCARDIOGRAM (TEE);  Surgeon: German Bartlett PEDLAR, MD;  Location: [facility] OR;  Service: Open Heart Surgery;  Laterality: N/A;   TONSILLECTOMY AND ADENOIDECTOMY  1955    Allergies  Allergies  Allergen Reactions   Hydroxyzine Hcl Other (See Comments)    Weakness, out of this world feeling. sluggishness   Latex Rash and Other (See Comments)    I.e. Elastic= rash to blisters if worn for an extended time  Patient show effects after long exposure.       History of Present Illness      73 y.o. y/o male  with a history of CAD status post multiple PCI's and CABG (2022), chronic heart failure with midrange ejection fraction, primary hypertension, hyperlipidemia, sleep apnea, type 2 diabetes mellitus, palpitations, PSVT, PACs, peripheral arterial disease, remote tobacco abuse, and prostate cancer.  Patient suffered his first myocardial infarction 1996 with another MI in 1997 with PCI and stenting of the mid LAD and diagonal in [facility], [place] .  He subsequently underwent multiple percutaneous interventions in the setting of recurrent chest pain and in-stent restenosis involving either the  LAD or diagonal.  In the setting of unstable angina July 2022, he underwent repeat catheterization which showed severe multivessel CAD and he subsequently underwent CABG x 5 (LIMA  LAD, VG  RPDA, VG  OM1  OM2, VG  D1).  In the setting of severe CAD and multiple myocardial infarctions, patient has known history of ischemic cardiomyopathy and heart failure.  EF previously as low as 25 to 35% by ventriculography prior to his bypass surgery in July 2022 with most recent echo in December 2022 showing slight improvement to 40-45%.  In May 2025, patient complained of palpitations and right calf cramping and claudication.  ZIO monitoring was performed and showed 717 runs of SVT lasting up to 2 minutes and 19 seconds, with a max rate of 182 bpm.  He was also noted to have a 13.2% PAC burden.  Triggered events were associated with PACs and SVT.  In the setting of claudication symptoms, ABIs were undertaken and were abnormal on the right with an  ABI of 0.72 and TBI of 0.55.  Lower extremity arterial duplex was notable for totally occluded right superficial femoral artery.  He was seen by Dr. Darron in August 2025 with recommendation for structured exercise therapy program but patient declined due to symptomatic limitations.  He was pending right shoulder surgery at the time and agreement was made to evaluate symptoms following surgery.  Unfortunately, he has been suffering from a dental infection and his shoulder surgery is on hold for an undetermined amount of time.   Beginning about 2 to 3 weeks ago, in the setting of significant emotional stress related to some things going on at home, patient started noticing intermittent episodes of chest heaviness exclusively occurring at rest, lasting several minutes, resolving with nitrates.  Despite symptoms during periods of emotional stress at rest, he had no exertional symptoms and in fact was able to complete a project he was working on that required heavy exertion without  any limitations.  He and his wife talked about 2 weeks ago and worked things out and he is emotional stress has resolved and with it, he has not any recurrence of chest discomfort.  He has continued to exert himself though notes that he is limited by ongoing right lower extremity claudication.  He can walk about 100 to 200 yards prior to having to stop and rest related to right leg pain.  He thinks this is pretty much the same as it was back in August when he saw Dr. Darron.  He denies dyspnea, PND, orthopnea, palpitations, dizziness, syncope, or early satiety.  He sometimes notes mild ankle swelling above his socks.   Objective   Home Medications    Current Outpatient Medications  Medication Sig Dispense Refill   acetaminophen  (TYLENOL ) 500 MG tablet Take 2 tablets (1,000 mg total) by mouth every 6 (six) hours as needed. 100 tablet 2   aspirin  EC 81 MG tablet Take 1 tablet (81 mg total) by mouth daily.     atorvastatin  (LIPITOR ) 80 MG tablet Take 1 tablet (80 mg total) by mouth daily. 90 tablet 3   blood glucose meter kit and supplies KIT Dispense based on patient and insurance preference. Use up to four times daily as directed. 1 each 0   carvedilol  (COREG ) 6.25 MG tablet Take 1 tablet (6.25 mg total) by mouth 2 (two) times daily with a meal. 180 tablet 3   diphenhydramine -acetaminophen  (TYLENOL  PM) 25-500 MG TABS tablet Take 1 tablet by mouth at bedtime as needed.     ezetimibe  (ZETIA ) 10 MG tablet TAKE 1 TABLET DAILY 90 tablet 1   furosemide  (LASIX ) 40 MG tablet Take 1 tablet (40 mg total) by mouth daily. 90 tablet 3   insulin  glargine (LANTUS ) 100 UNIT/ML Solostar Pen Inject 25 Units into the skin daily. 30 mL 2   Insulin  Pen Needle 31G X 5 MM MISC Inject insulin  via insulin  pen daily 50 each 0   isosorbide  mononitrate (IMDUR ) 30 MG 24 hr tablet Take 1 tablet (30 mg total) by mouth daily. 90 tablet 3   ketoconazole  (NIZORAL ) 2 % cream Apply twice daily to pink, scaly areas on face, chest, arms  until improved. (Patient taking differently: Apply 1 Application topically 2 (two) times daily as needed for irritation. Apply twice daily to pink, scaly areas on face, chest, arms until improved.) 60 g 3   losartan  (COZAAR ) 25 MG tablet Take 1 tablet (25 mg total) by mouth daily. 90 tablet 3   Magnesium  250 MG TABS  Take 1 tablet by mouth in the morning and at bedtime.     metFORMIN  (GLUCOPHAGE ) 1000 MG tablet Take 1 tablet (1,000 mg total) by mouth 2 (two) times daily with a meal. 180 tablet 3   Multiple Vitamins-Minerals (MULTIVITAMINS THER. W/MINERALS) TABS Take 1 tablet by mouth every morning.     niacin  (NIASPAN ) 1000 MG CR tablet Take 2 tablets (2000 mg) by mouth once daily at bedtime 180 tablet 3   nitroGLYCERIN  (NITROLINGUAL ) 0.4 MG/SPRAY spray Place 1 spray under the tongue every 5 (five) minutes x 3 doses as needed for chest pain. 12 g 6   potassium chloride  SA (KLOR-CON  M) 20 MEQ tablet Take 1 tablet (20 mEq total) by mouth daily. 90 tablet 3   Semaglutide , 2 MG/DOSE, 8 MG/3ML SOPN Inject 2 mg as directed once a week. 9 mL 0   No current facility-administered medications for this visit.     Physical Exam    VS:  BP 124/70 (BP Location: Left Arm, Patient Position: Sitting, Cuff Size: Large)   Pulse 89   Ht 5' 8 (1.727 m)   Wt 193 lb (87.5 kg)   SpO2 98%   BMI 29.35 kg/m  , BMI Body mass index is 29.35 kg/m.          GEN: Well nourished, well developed, in no acute distress. HEENT: normal. Neck: Supple, no JVD, carotid bruits, or masses. Cardiac: RRR, no murmurs, rubs, or gallops. No clubbing, cyanosis, edema.  Radials 2+/PT 1+ on the right and 2+ on the left.  Respiratory:  Respirations regular and unlabored, clear to auscultation bilaterally. GI: Soft, nontender, nondistended, BS + x 4. MS: no deformity or atrophy. Skin: warm and dry, no rash. Neuro:  Strength and sensation are intact. Psych: Normal affect.  Accessory Clinical Findings    ECG personally reviewed by  me today - EKG Interpretation Date/Time:  Wednesday March 23 2024 10:53:57 EST Ventricular Rate:  89 PR Interval:  148 QRS Duration:  100 QT Interval:  360 QTC Calculation: 438 R Axis:   6  Text Interpretation: Normal sinus rhythm Septal infarct (cited on or before 15-Nov-2020) Cannot rule out Inferior infarct , age undetermined Confirmed by Vivienne Bruckner [phone]) on 03/23/2024 11:17:12 AM  - no acute changes.  Lab Results  Component Value Date   WBC 9.6 08/18/2023   HGB 14.4 08/18/2023   HCT 41.6 08/18/2023   MCV 89.7 08/18/2023   PLT 183 08/18/2023   Lab Results  Component Value Date   CREATININE 1.08 11/16/2023   BUN 15 11/16/2023   NA 137 11/16/2023   K 4.3 11/16/2023   CL 104 11/16/2023   CO2 27 11/16/2023   Lab Results  Component Value Date   ALT 16 11/16/2023   AST 18 11/16/2023   ALKPHOS 47 11/16/2023   BILITOT 0.8 11/16/2023   Lab Results  Component Value Date   CHOL 181 11/16/2023   HDL 40.80 11/16/2023   LDLCALC 115 (H) 11/16/2023   LDLDIRECT 71.0 07/11/2019   TRIG 124.0 11/16/2023   CHOLHDL 4 11/16/2023    Lab Results  Component Value Date   HGBA1C 6.1 (A) 11/16/2023   Lab Results  Component Value Date   TSH 0.919 11/22/2014       Assessment & Plan    1.  Precordial pain/CAD: Patient with an extensive CAD history status post multiple interventions to the diagonal and LAD followed by CABG x 5 in 2022.  In the setting of significant emotional stress,  he started having chest pain about 2 weeks ago, occurring exclusively at rest and resolving after using nitrates.  He did not have any exertional symptoms at that time and since his emotional stress has been alleviated, he has not had any recurrent chest pain symptoms.  We discussed options for management today and agreed to pursue a Lexiscan PET stress test for risk stratification.  He remains on aspirin , statin, beta-blocker, ARB, long-acting nitrate, and Zetia  therapy.  2.  Peripheral arterial  disease: Ongoing right lower extremity claudication with evaluation earlier this year showing abnormal ABIs/TBI's on the right with duplex showing occluded right SFA.  Overall, he thinks symptoms are stable and can still walk about 100 to 200 yards prior to having to rest.  Seen by Dr. Darron in August 2025.  Patient does not think he will be able to tolerate an exercise program and still desires angiography.  In light of recent chest pain symptoms, we agreed to pursue stress testing and risk stratification first.  Provided that ischemic testing is low risk, will arrange for follow-up with Dr. Darron to further discuss peripheral angiography.  He remains on aspirin , statin, and Zetia  therapy.  3.  Chronic heart failure with midrange ejection fraction/ischemic cardiomyopathy: EF of 40 to 45% by echo in December 2022.  Euvolemic on examination without dyspnea.  He remains on beta-blocker and ARB therapy in addition to Lasix  40 mg daily.  SGLT2 inhibitor previously discontinued secondary to urosepsis in 2023.  4.  Primary hypertension: Stable on beta-blocker, ARB, and nitrate therapy.  5.  Hyperlipidemia: LDL had increased 215 by lab work in July that was noted that patient was not taking his atorvastatin .  Now on atorvastatin  and Zetia .  6.  Type 2 diabetes mellitus: A1c of 6.1 earlier this year.  Remains on metformin  and Lantus .  7.  Obstructive sleep apnea: Compliant with CPAP.  8.  Palpitations/PSVT/PACs: Monitoring May 2025 showed up 717 runs of SVT lasting up to 2 minutes and 19 seconds as well as a 13.2% PAC burden.  Quiescent on beta-blocker therapy.  9.  Disposition: Follow-up Lexiscan PET stress and follow-up in clinic in 1 month or sooner if necessary.    Lonni Meager, NP 03/23/2024, 12:48 PM

## 2024-03-25 ENCOUNTER — Other Ambulatory Visit: Payer: Self-pay

## 2024-03-25 DIAGNOSIS — C61 Malignant neoplasm of prostate: Secondary | ICD-10-CM

## 2024-03-26 LAB — PSA: Prostate Specific Ag, Serum: 5.1 ng/mL — ABNORMAL HIGH (ref 0.0–4.0)

## 2024-03-28 ENCOUNTER — Ambulatory Visit

## 2024-03-28 ENCOUNTER — Ambulatory Visit: Admitting: Urology

## 2024-03-29 ENCOUNTER — Ambulatory Visit: Payer: Self-pay | Admitting: Urology

## 2024-04-01 ENCOUNTER — Ambulatory Visit
Admission: EM | Admit: 2024-04-01 | Discharge: 2024-04-01 | Disposition: A | Discharge status: Home / Self Care | Attending: Emergency Medicine | Admitting: Emergency Medicine

## 2024-04-01 ENCOUNTER — Encounter: Payer: Self-pay | Admitting: Emergency Medicine

## 2024-04-01 DIAGNOSIS — R220 Localized swelling, mass and lump, head: Secondary | ICD-10-CM

## 2024-04-01 DIAGNOSIS — K0889 Other specified disorders of teeth and supporting structures: Secondary | ICD-10-CM | POA: Diagnosis not present

## 2024-04-01 MED ORDER — CLINDAMYCIN HCL 150 MG PO CAPS: 150.0000 mg | ORAL_CAPSULE | Freq: Three times a day (TID) | ORAL | 0 refills | Status: AC

## 2024-04-01 MED ORDER — PREDNISONE 10 MG (21) PO TBPK: ORAL_TABLET | Freq: Every day | ORAL | 0 refills | Status: DC

## 2024-04-01 NOTE — ED Triage Notes (Signed)
 Patient reports dental infection and pain on left lower side. Rates pain 3/10. Patient has not taken anything for symptoms.

## 2024-04-01 NOTE — ED Provider Notes (Signed)
 [name]    CSN: [id number] Arrival date & time: 04/01/24  9189      History   Chief Complaint Chief Complaint  Patient presents with   Dental Pain    HPI [name] is a 73 y.o. male.   Patient presents for evaluation of left lower dental pain beginning 3 days ago, symptoms worsening 1 day ago with left lower jaw swelling.  Difficult and painful to chew causing a decreased intake, able to tolerate fluids.  Has not attempted treatment.  Has no need for dental work.  Denies drainage or fever.       Past Medical History:  Diagnosis Date   Actinic keratoses    Angina    Asthma    BCC (basal cell carcinoma of skin) 09/06/2020   left upper back lateral (EDC) ,  left upper arm anterior (EDC), left upper back medial (EDC)    Bursitis of left elbow 2018   Diabetes mellitus without complication [facility])    ED (erectile dysfunction)    Grade I diastolic dysfunction    Heart failure with mid-range ejection fraction (HCC)    a. 2010 Echo: EF 45-50%; b. 06/2012 LV Gram: EF 40%; c. 11/2020 LV Gram: EF 25-35%; d. 04/2021 Echo: EF 40-45%.   History of heart artery stent    HTN (hypertension) 04/12/2011   Hyperlipemia 04/12/2011   Ischemic cardiomyopathy    a. echo 2010: EF 45-50%, mild to mod ant and apical wall HK, trace MR; b. cardiac cath 06/2012: EF 40%, mild MR; c. 11/2020 LV Gram: EF 25-35%; d. 04/2021 Echo: EF 40-45%, ant, mid-apical antsept, apical HK. GrI DD. Nl RV fxn. RVSP 44.53mmHg. Mod dil LA. Mild-mod MR.   Mitral regurgitation    a. 04/2021 Echo: Mild-mod MR.   Multiple vessel coronary artery disease    a. remote MI 1996; b. MI 1997 s/p PCI - LAD & diag; c. MI 2009: long stenting P-MLAD & Diag & POBA of jailed diag branch; d. cath 2010: 80% ISR of LAD w/ L-R collats, med Rx; e. cath 2012: 50-60% tub mLAD, 60-70% dLAD, FFR 0.75 -->s/p PCI dissection => w/ 2 stents (3 total); f. cath 06/2012: no changes from 2012-> med Rx; g. 11/2020 Cath: MVD-> CABG X 5  (LIMA->LAD, VG->RPDA, VG->OM1->OM2, VG->D1).   Myocardial infarction (HCC)    x 5   Nephrolithiasis    Obesity    OSA (obstructive sleep apnea)    on CPAP   Osteoarthritis    PAD (peripheral artery disease)    a. 11/2023 ABIs/TBIs: R 0.72/0.55, L 1.27/1.03.  Duplex - total occlusion of R SFA.   Premature atrial contractions    a. 10/2023 Zio: 13.2% PAC burden.   PSVT (paroxysmal supraventricular tachycardia)    a. 10/2023 Zio: 717 runs of SVT lasting up to 2 minutes and 19 seconds, with a max rate of 182 bpm.  He was also noted to have a 13.2% PAC burden.  Triggered events were associated with PACs and SVT.   Stable angina    Uncontrolled type 2 diabetes mellitus with hyperglycemia, without long-term current use of insulin  [facility])     Patient Active Problem List   Diagnosis Date Noted   Eustachian tube dysfunction, left 11/16/2023   Tinea versicolor 12/30/2022   Irregular heart beat 10/10/2022   Depression, major, single episode, mild 04/02/2022   Nicotine dependence 12/31/2021   Other and unspecified peripheral vertigo 12/31/2021   Neuropathy 12/31/2021   Anemia 11/11/2021  Left hip pain 07/02/2021   Hearing loss 07/02/2021   Foot cramps 03/20/2021   Anxiety 12/11/2020   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   Multiple vessel coronary artery disease 11/20/2020   Bone spicules of jaw 12/27/2018   Adrenal adenoma 03/03/2017   Right shoulder pain 03/03/2017   OSA (obstructive sleep apnea) 07/30/2016   Low back pain 05/15/2016   Actinic keratoses 02/07/2016   Right hip pain 02/07/2016   Ischemic cardiomyopathy    Uncontrolled type 2 diabetes mellitus with hyperglycemia, without long-term current use of insulin  (HCC) 11/30/2014   CAD s/p CABG (coronary artery disease) 04/12/2011   HTN (hypertension) 04/12/2011   Hyperlipemia 04/12/2011   Prostate cancer (HCC) 04/12/2011    Past Surgical History:  Procedure Laterality Date   CATARACT EXTRACTION W/PHACO Right 12/22/2023   Procedure:  PHACOEMULSIFICATION, CATARACT, WITH IOL INSERTION 7.83, 00:55.5;  Surgeon: Jaye Fallow, MD;  Location: [facility];  Service: Ophthalmology;  Laterality: Right;   CATARACT EXTRACTION W/PHACO Left 01/05/2024   Procedure: PHACOEMULSIFICATION, CATARACT, WITH IOL INSERTION 8.65 01:06.1;  Surgeon: Jaye Fallow, MD;  Location: [facility];  Service: Ophthalmology;  Laterality: Left;   CORONARY ANGIOPLASTY     x 5   CORONARY ARTERY BYPASS GRAFT N/A 11/20/2020   Procedure: CORONARY ARTERY BYPASS GRAFTING (CABG) x  FIVE (LIMA-dLAD, SVG-PDA, SVG-D1, SeqSVG-OM1-2) ON PUMP  USING LEFT INTERNAL MAMMARY ARTERTY AND LEFT ENDOSCOPIC GREATER SAPHENOUS VEIN CONDUITS, RIGHT LEG OPENED NOT HARVESTED;  Surgeon: German Bartlett PEDLAR, MD;  Location: [facility] OR;  Service: Open Heart Surgery;  Laterality: N/A;   CORONARY PRESSURE/FFR STUDY N/A 11/15/2020   Procedure: INTRAVASCULAR PRESSURE WIRE/FFR STUDY;  Surgeon: Mady Bruckner, MD;  Location: [facility] INVASIVE CV LAB;  Service: Cardiovascular;  Laterality: N/A;   CORONARY STENT PLACEMENT     x 7   LEFT HEART CATH AND CORONARY ANGIOGRAPHY N/A 11/15/2020   Procedure: LEFT HEART CATH AND CORONARY ANGIOGRAPHY;  Surgeon: Mady Bruckner, MD;  Location: [facility] INVASIVE CV LAB;  Service: Cardiovascular;  Laterality: N/A;   LEFT HEART CATHETERIZATION WITH CORONARY ANGIOGRAM N/A 04/14/2011   Procedure: LEFT HEART CATHETERIZATION WITH CORONARY ANGIOGRAM;  Surgeon: Vinie KYM Maxcy, MD;  Location: [facility] CATH LAB;  Service: Cardiovascular;  Laterality: N/A;   LEFT HEART CATHETERIZATION WITH CORONARY ANGIOGRAM N/A 06/25/2012   Procedure: LEFT HEART CATHETERIZATION WITH CORONARY ANGIOGRAM;  Surgeon: Peter M Jordan, MD;  Location: [facility] CATH LAB;  Service: Cardiovascular;  Laterality: N/A;   TEE WITHOUT CARDIOVERSION N/A 11/20/2020   Procedure: TRANSESOPHAGEAL ECHOCARDIOGRAM (TEE);  Surgeon: German Bartlett PEDLAR, MD;  Location: [facility] OR;  Service: Open Heart Surgery;  Laterality: N/A;    TONSILLECTOMY AND ADENOIDECTOMY  1955       Home Medications    Prior to Admission medications   Medication Sig Start Date End Date Taking? Authorizing Provider  acetaminophen  (TYLENOL ) 500 MG tablet Take 2 tablets (1,000 mg total) by mouth every 6 (six) hours as needed. 08/18/23 08/17/24  Clarine Ozell LABOR, MD  aspirin  EC 81 MG tablet Take 1 tablet (81 mg total) by mouth daily. 01/07/24   Darron Deatrice LABOR, MD  atorvastatin  (LIPITOR ) 80 MG tablet Take 1 tablet (80 mg total) by mouth daily. 11/18/23   Narendra, Nischal, MD  blood glucose meter kit and supplies KIT Dispense based on patient and insurance preference. Use up to four times daily as directed. 10/25/21   Bryn Bernardino NOVAK, MD  carvedilol  (COREG ) 6.25 MG tablet Take 1 tablet (6.25 mg total) by mouth 2 (two)  times daily with a meal. 05/04/23   Gollan, Timothy J, MD  diphenhydramine -acetaminophen  (TYLENOL  PM) 25-500 MG TABS tablet Take 1 tablet by mouth at bedtime as needed.    [provider]  ezetimibe  (ZETIA ) 10 MG tablet TAKE 1 TABLET DAILY 08/20/23   Gollan, Timothy J, MD  furosemide  (LASIX ) 40 MG tablet Take 1 tablet (40 mg total) by mouth daily. 05/04/23   Gollan, Timothy J, MD  insulin  glargine (LANTUS ) 100 UNIT/ML Solostar Pen Inject 25 Units into the skin daily. 11/16/23   Narendra, Nischal, MD  Insulin  Pen Needle 31G X 5 MM MISC Inject insulin  via insulin  pen daily 10/25/21   Bryn Bernardino NOVAK, MD  isosorbide  mononitrate (IMDUR ) 30 MG 24 hr tablet Take 1 tablet (30 mg total) by mouth daily. 05/04/23   Gollan, Timothy J, MD  ketoconazole  (NIZORAL ) 2 % cream Apply twice daily to pink, scaly areas on face, chest, arms until improved. Patient taking differently: Apply 1 Application topically 2 (two) times daily as needed for irritation. Apply twice daily to pink, scaly areas on face, chest, arms until improved. 12/30/22   Claudene Lehmann, MD  losartan  (COZAAR ) 25 MG tablet Take 1 tablet (25 mg total) by mouth daily. 05/04/23   Gollan,  Timothy J, MD  Magnesium  250 MG TABS Take 1 tablet by mouth in the morning and at bedtime.    [provider]  metFORMIN  (GLUCOPHAGE ) 1000 MG tablet Take 1 tablet (1,000 mg total) by mouth 2 (two) times daily with a meal. 11/16/23   Onesimo Claude, MD  Multiple Vitamins-Minerals (MULTIVITAMINS THER. W/MINERALS) TABS Take 1 tablet by mouth every morning.    [provider]  niacin  (NIASPAN ) 1000 MG CR tablet Take 2 tablets (2000 mg) by mouth once daily at bedtime 11/25/23   Gollan, Timothy J, MD  nitroGLYCERIN  (NITROLINGUAL ) 0.4 MG/SPRAY spray Place 1 spray under the tongue every 5 (five) minutes x 3 doses as needed for chest pain. 05/04/23   Gollan, Timothy J, MD  potassium chloride  SA (KLOR-CON  M) 20 MEQ tablet Take 1 tablet (20 mEq total) by mouth daily. 05/04/23   Gollan, Timothy J, MD  Semaglutide , 2 MG/DOSE, 8 MG/3ML SOPN Inject 2 mg as directed once a week. 01/18/24   Onesimo Claude, MD    Family History Family History  Problem Relation Age of Onset   COPD Mother    Other Father        muscle tumor   Stroke Father    Prostate cancer Father        mets   Heart disease Father    Alcoholism Father    Stroke Sister    Heart block Sister        Heart bypass   Bladder Cancer Neg Hx    Kidney cancer Neg Hx     Social History Social History   Tobacco Use   Smoking status: Former    Current packs/day: 0.00    Average packs/day: 1.5 packs/day for 40.0 years (60.0 ttl pk-yrs)    Types: Cigarettes    Start date: 09/14/1967    Quit date: 09/14/2007    Years since quitting: 16.5   Smokeless tobacco: Never   Tobacco comments:    quit 2009  Vaping Use   Vaping status: Never Used  Substance Use Topics   Alcohol use: No    Alcohol/week: 0.0 standard drinks of alcohol    Comment: rarely 1 beer in last 5 years   Drug use: Not Currently  Types: Marijuana    Comment: pt stopped fall 2012     Allergies   Hydroxyzine hcl and Latex   Review of Systems Review  of Systems   Physical Exam Triage Vital Signs ED Triage Vitals  Encounter Vitals Group     BP 04/01/24 0822 127/75     Girls Systolic BP Percentile --      Girls Diastolic BP Percentile --      Boys Systolic BP Percentile --      Boys Diastolic BP Percentile --      Pulse Rate 04/01/24 0822 89     Resp 04/01/24 0822 18     Temp 04/01/24 0822 98 F (36.7 C)     Temp Source 04/01/24 0822 Oral     SpO2 04/01/24 0821 100 %     Weight --      Height --      Head Circumference --      Peak Flow --      Pain Score 04/01/24 0820 3     Pain Loc --      Pain Education --      Exclude from Growth Chart --    No data found.  Updated Vital Signs BP 127/75 (BP Location: Left Arm)   Pulse 89   Temp 98 F (36.7 C) (Oral)   Resp 18   SpO2 100%   Visual Acuity Right Eye Distance:   Left Eye Distance:   Bilateral Distance:    Right Eye Near:   Left Eye Near:    Bilateral Near:     Physical Exam Constitutional:      Appearance: Normal appearance.  HENT:     Mouth/Throat:     Comments: Dental decay along the left lower gumline with mild gingival swelling, no abscess noted, swelling of the left cheek Pulmonary:     Effort: Pulmonary effort is normal.  Neurological:     Mental Status: He is alert and oriented to person, place, and time.      UC Treatments / Results  Labs (all labs ordered are listed, but only abnormal results are displayed) Labs Reviewed - No data to display  EKG   Radiology No results found.  Procedures Procedures (including critical care time)  Medications Ordered in UC Medications - No data to display  Initial Impression / Assessment and Plan / UC Course  I have reviewed the triage vital signs and the nursing notes.  Pertinent labs & imaging results that were available during my care of the patient were reviewed by me and considered in my medical decision making (see chart for details).  Dental pain, left facial swelling  Presentation  concerning for infection, has no need for dental work, prescribed clindamycin  as well as prednisone  for management, recommend over-the-counter analgesics and supportive care advised increase fluid intake until able to tolerate food at baseline, strongly advised dental follow-up when able Final Clinical Impressions(s) / UC Diagnoses   Final diagnoses:  None   Discharge Instructions   None    ED Prescriptions   None    PDMP not reviewed this encounter.   Teresa Shelba SAUND[place], [place] 04/01/24 [phone]

## 2024-04-01 NOTE — Discharge Instructions (Signed)
 Your evaluated for your dental pain  Take clindamycin  every 8 hours for 7 days for treatment of bacteria  To reduce swelling and help with pain begin prednisone  every morning with food as directed avoid ibuprofen while taking but may use Tylenol   May attempt salt water  gargles throat lozenges warm liquids and soft foods for additional comfort, increase your fluid intake until able to tolerate food at baseline  For any further concerns may follow-up with urgent care, please when able follow-up with dentist for further management of teeth

## 2024-04-12 ENCOUNTER — Encounter (HOSPITAL_COMMUNITY): Payer: Self-pay

## 2024-04-14 ENCOUNTER — Ambulatory Visit
Admission: RE | Admit: 2024-04-14 | Discharge: 2024-04-14 | Disposition: A | Source: Ambulatory Visit | Attending: Nurse Practitioner | Admitting: Nurse Practitioner

## 2024-04-14 DIAGNOSIS — R072 Precordial pain: Secondary | ICD-10-CM | POA: Diagnosis not present

## 2024-04-14 DIAGNOSIS — R918 Other nonspecific abnormal finding of lung field: Secondary | ICD-10-CM | POA: Diagnosis not present

## 2024-04-14 DIAGNOSIS — D3502 Benign neoplasm of left adrenal gland: Secondary | ICD-10-CM | POA: Diagnosis not present

## 2024-04-14 LAB — NM PET CT CARDIAC PERFUSION MULTI W/ABSOLUTE BLOODFLOW
LV dias vol: 162 mL (ref 62–150)
LV sys vol: 103 mL (ref 4.2–5.8)
Nuc Rest EF: 35 %
Nuc Stress EF: 36 %
Peak HR: 104 {beats}/min
Rest HR: 86 {beats}/min
Rest Nuclear Isotope Dose: 23.1 mCi
SRS: 15
SSS: 23
Stress Nuclear Isotope Dose: 22.7 mCi
TID: 1.05

## 2024-04-14 MED ORDER — REGADENOSON 0.4 MG/5ML IV SOLN
0.4000 mg | Freq: Once | INTRAVENOUS | Status: AC
  Administered 2024-04-14: 0.4 mg via INTRAVENOUS
  Filled 2024-04-14: qty 5

## 2024-04-14 MED ORDER — REGADENOSON 0.4 MG/5ML IV SOLN
INTRAVENOUS | Status: AC
  Filled 2024-04-14: qty 5

## 2024-04-14 MED ORDER — RUBIDIUM RB82 GENERATOR (RUBYFILL)
25.0000 | PACK | Freq: Once | INTRAVENOUS | Status: AC
  Administered 2024-04-14: 23.08 via INTRAVENOUS

## 2024-04-14 MED ORDER — RUBIDIUM RB82 GENERATOR (RUBYFILL)
25.0000 | PACK | Freq: Once | INTRAVENOUS | Status: AC
  Administered 2024-04-14: 22.66 via INTRAVENOUS

## 2024-04-14 NOTE — Progress Notes (Signed)
 Patient presents for a cardiac PET stress test and tolerated procedure with some abdominal tightness. He reported that the tightness didn't last long. Patient maintained acceptable vital signs throughout the test and was offered caffeine after test.  Patient ambulated out of department with a steady gait.

## 2024-04-15 ENCOUNTER — Ambulatory Visit: Payer: Self-pay | Admitting: Nurse Practitioner

## 2024-04-20 ENCOUNTER — Other Ambulatory Visit: Payer: Self-pay | Admitting: *Deleted

## 2024-04-20 DIAGNOSIS — C61 Malignant neoplasm of prostate: Secondary | ICD-10-CM

## 2024-04-21 ENCOUNTER — Inpatient Hospital Stay
Admission: RE | Admit: 2024-04-21 | Discharge: 2024-04-21 | Attending: Radiation Oncology | Admitting: Radiation Oncology

## 2024-04-21 ENCOUNTER — Encounter: Payer: Self-pay | Admitting: Radiation Oncology

## 2024-04-21 ENCOUNTER — Other Ambulatory Visit: Payer: Self-pay | Admitting: *Deleted

## 2024-04-21 VITALS — BP 147/81 | HR 85 | Temp 98.3°F | Resp 16 | Wt 189.2 lb

## 2024-04-21 DIAGNOSIS — C61 Malignant neoplasm of prostate: Secondary | ICD-10-CM | POA: Diagnosis not present

## 2024-04-21 DIAGNOSIS — Z923 Personal history of irradiation: Secondary | ICD-10-CM | POA: Diagnosis not present

## 2024-04-21 NOTE — Progress Notes (Signed)
 Radiation Oncology Follow up Note  Name: [name]   Date:   04/21/2024 MRN:  [id number] DOB: [birth year]    This 73 y.o. male presents to the clinic today for 80-month follow-up status post image guided radiation therapy for Gleason 7 (4+3) adenocarcinoma presenting with a PSA in the 7.4 range.  REFERRING PROVIDER: No ref. provider found  HPI: Patient is a 73 year old male now out 28 months have a completed image guided IMRT radiation therapy for Gleason 7 adenocarcinoma the prostate.  Seen today in routine follow-up symptomatically he is doing well specifically denies any increased lower urinary tract symptoms diarrhea or fatigue he is having no bone pain.  His PSA is was elevated up to 4.6 back in September recently treated to 5.1..  I ordered a PSMA PET scan showing no focal activity to suggest skeletal metastasis.  There was radiotracer accumulation in the left posterior prostate gland suspicious for local recurrent carcinoma all no really targeted area for radiation.  No evidence of missed metastatic lymphadenopathy.  COMPLICATIONS OF TREATMENT: none  FOLLOW UP COMPLIANCE: keeps appointments   PHYSICAL EXAM:  BP (!) 147/81   Pulse 85   Temp 98.3 F (36.8 C) (Tympanic)   Resp 16   Wt 189 lb 3.2 oz (85.8 kg)   BMI 28.77 kg/m  Well-developed well-nourished patient in NAD. HEENT reveals PERLA, EOMI, discs not visualized.  Oral cavity is clear. No oral mucosal lesions are identified. Neck is clear without evidence of cervical or supraclavicular adenopathy. Lungs are clear to A&P. Cardiac examination is essentially unremarkable with regular rate and rhythm without murmur rub or thrill. Abdomen is benign with no organomegaly or masses noted. Motor sensory and DTR levels are equal and symmetric in the upper and lower extremities. Cranial nerves II through XII are grossly intact. Proprioception is intact. No peripheral adenopathy or edema is identified. No motor or sensory levels are  noted. Crude visual fields are within normal range.  RADIOLOGY RESULTS: PSMA PET scan reviewed compatible with above-stated findings  PLAN: At this time I am referring him to medical oncology for consideration of systemic treatment possibly ADT therapy plus other appropriate systemic treatments.  I have asked to see him back in 6 months for follow-up.  Patient comprehends my recommendations well referral to medical oncology was made.  I would like to take this opportunity to thank you for allowing me to participate in the care of your patient.SABRA Marcey Penton, MD

## 2024-04-26 ENCOUNTER — Inpatient Hospital Stay: Attending: Oncology

## 2024-04-26 DIAGNOSIS — R911 Solitary pulmonary nodule: Secondary | ICD-10-CM | POA: Insufficient documentation

## 2024-04-26 DIAGNOSIS — M816 Localized osteoporosis [Lequesne]: Secondary | ICD-10-CM | POA: Insufficient documentation

## 2024-04-26 DIAGNOSIS — Z85828 Personal history of other malignant neoplasm of skin: Secondary | ICD-10-CM | POA: Insufficient documentation

## 2024-04-26 DIAGNOSIS — C61 Malignant neoplasm of prostate: Secondary | ICD-10-CM | POA: Insufficient documentation

## 2024-04-26 DIAGNOSIS — Z923 Personal history of irradiation: Secondary | ICD-10-CM | POA: Insufficient documentation

## 2024-04-26 DIAGNOSIS — Z6372 Alcoholism and drug addiction in family: Secondary | ICD-10-CM | POA: Insufficient documentation

## 2024-04-26 DIAGNOSIS — Z8249 Family history of ischemic heart disease and other diseases of the circulatory system: Secondary | ICD-10-CM | POA: Insufficient documentation

## 2024-04-26 DIAGNOSIS — Z87442 Personal history of urinary calculi: Secondary | ICD-10-CM | POA: Insufficient documentation

## 2024-04-26 DIAGNOSIS — E291 Testicular hypofunction: Secondary | ICD-10-CM | POA: Insufficient documentation

## 2024-04-26 DIAGNOSIS — Z811 Family history of alcohol abuse and dependence: Secondary | ICD-10-CM | POA: Insufficient documentation

## 2024-04-26 DIAGNOSIS — E119 Type 2 diabetes mellitus without complications: Secondary | ICD-10-CM | POA: Insufficient documentation

## 2024-04-26 DIAGNOSIS — Z825 Family history of asthma and other chronic lower respiratory diseases: Secondary | ICD-10-CM | POA: Insufficient documentation

## 2024-04-26 DIAGNOSIS — E785 Hyperlipidemia, unspecified: Secondary | ICD-10-CM | POA: Insufficient documentation

## 2024-04-26 DIAGNOSIS — I2510 Atherosclerotic heart disease of native coronary artery without angina pectoris: Secondary | ICD-10-CM | POA: Insufficient documentation

## 2024-04-26 DIAGNOSIS — N135 Crossing vessel and stricture of ureter without hydronephrosis: Secondary | ICD-10-CM | POA: Insufficient documentation

## 2024-04-26 DIAGNOSIS — Z8042 Family history of malignant neoplasm of prostate: Secondary | ICD-10-CM | POA: Insufficient documentation

## 2024-04-26 DIAGNOSIS — Z9842 Cataract extraction status, left eye: Secondary | ICD-10-CM | POA: Insufficient documentation

## 2024-04-26 DIAGNOSIS — Z91040 Latex allergy status: Secondary | ICD-10-CM | POA: Insufficient documentation

## 2024-04-26 DIAGNOSIS — Z7982 Long term (current) use of aspirin: Secondary | ICD-10-CM | POA: Insufficient documentation

## 2024-04-26 DIAGNOSIS — I252 Old myocardial infarction: Secondary | ICD-10-CM | POA: Insufficient documentation

## 2024-04-26 DIAGNOSIS — Z9841 Cataract extraction status, right eye: Secondary | ICD-10-CM | POA: Insufficient documentation

## 2024-04-26 DIAGNOSIS — Z9089 Acquired absence of other organs: Secondary | ICD-10-CM | POA: Insufficient documentation

## 2024-04-26 DIAGNOSIS — Z79899 Other long term (current) drug therapy: Secondary | ICD-10-CM | POA: Insufficient documentation

## 2024-04-26 DIAGNOSIS — Z87891 Personal history of nicotine dependence: Secondary | ICD-10-CM | POA: Insufficient documentation

## 2024-04-26 DIAGNOSIS — Z823 Family history of stroke: Secondary | ICD-10-CM | POA: Insufficient documentation

## 2024-04-28 ENCOUNTER — Encounter: Payer: Self-pay | Admitting: Cardiovascular Disease

## 2024-04-28 ENCOUNTER — Ambulatory Visit: Attending: Cardiovascular Disease | Admitting: Cardiovascular Disease

## 2024-04-28 VITALS — BP 120/74 | HR 86 | Ht 68.0 in | Wt 189.1 lb

## 2024-04-28 DIAGNOSIS — I255 Ischemic cardiomyopathy: Secondary | ICD-10-CM | POA: Diagnosis present

## 2024-04-28 DIAGNOSIS — E7800 Pure hypercholesterolemia, unspecified: Secondary | ICD-10-CM | POA: Diagnosis present

## 2024-04-28 DIAGNOSIS — I10 Essential (primary) hypertension: Secondary | ICD-10-CM | POA: Diagnosis present

## 2024-04-28 DIAGNOSIS — I739 Peripheral vascular disease, unspecified: Secondary | ICD-10-CM | POA: Insufficient documentation

## 2024-04-28 DIAGNOSIS — I5022 Chronic systolic (congestive) heart failure: Secondary | ICD-10-CM | POA: Diagnosis present

## 2024-04-28 DIAGNOSIS — I2510 Atherosclerotic heart disease of native coronary artery without angina pectoris: Secondary | ICD-10-CM | POA: Insufficient documentation

## 2024-04-28 MED ORDER — LOSARTAN POTASSIUM 25 MG PO TABS: 25.0000 mg | ORAL_TABLET | Freq: Every day | ORAL | 3 refills | Status: AC

## 2024-04-28 MED ORDER — POTASSIUM CHLORIDE CRYS ER 20 MEQ PO TBCR: 20.0000 meq | EXTENDED_RELEASE_TABLET | Freq: Every day | ORAL | 3 refills | Status: AC

## 2024-04-28 MED ORDER — ISOSORBIDE MONONITRATE ER 30 MG PO TB24: 30.0000 mg | ORAL_TABLET | Freq: Every day | ORAL | 3 refills | Status: AC

## 2024-04-28 MED ORDER — CARVEDILOL 6.25 MG PO TABS: 6.2500 mg | ORAL_TABLET | Freq: Two times a day (BID) | ORAL | 3 refills | Status: AC

## 2024-04-28 MED ORDER — ATORVASTATIN CALCIUM 80 MG PO TABS: 80.0000 mg | ORAL_TABLET | Freq: Every day | ORAL | 3 refills | Status: AC

## 2024-04-28 MED ORDER — FUROSEMIDE 40 MG PO TABS: 40.0000 mg | ORAL_TABLET | Freq: Every day | ORAL | 3 refills | Status: AC

## 2024-04-28 NOTE — Progress Notes (Signed)
 Cardiology Office Note   Date:  04/28/2024   ID:  [name], DOB 11-23-1950, MRN [id number]  PCP:  [facility], [place], MD  Cardiologist:  Dr. Perla  Chief Complaint  Patient presents with   Follow-up    F/u PET/CT c/o dull ache chest discomfort. Meds reviewed verbally with pt.      History of Present Illness: [name] is a 73 y.o. male who is here today for a follow-up visit regarding peripheral arterial disease.   He has known history of coronary artery disease status post multiple PCI's and most recently CABG in 2022, chronic systolic heart failure with mildly reduced LV systolic function, essential hypertension, hyperlipidemia, obstructive sleep apnea, type 2 diabetes and prostate cancer. He was seen most recently in May for palpitations and right calf claudication.  Outpatient monitor showed frequent short runs of SVT the longest lasted 19 seconds with frequent PACs with a burden of 13%.  Lower extremity arterial Doppler showed an ABI of 0.72 on the right and normal on the left.  Duplex showed occluded distal right SFA. He has right calf claudication that happens after short distance walking.   He used to walk 2-1/2 miles per day with no issues but has not been able to do that over the last 2 years.  In addition, his symptoms have been slowly progressing to the point that he gets discomfort after walking less than 50 feet.    He was seen in October for chest pain in the setting of emotional stress.  He underwent cardiac PET scan which was intermediate risk study with evidence of fixed anterior/anteroseptal defect with minimal reversibility.  EF was 35%.  Anterior wall defect correlates with known history of prior anterior MI. He describes aching sensation substernally that seems to last hours at a time and does not get worse with exertion.  This is different from his prior angina.  He is mostly limited by severe right calf claudication which is happening with  minimal walking.  No rest pain or lower extremity ulceration  Past Medical History:  Diagnosis Date   Actinic keratoses    Angina    Asthma    BCC (basal cell carcinoma of skin) 09/06/2020   left upper back lateral (EDC) ,  left upper arm anterior (EDC), left upper back medial (EDC)    Bursitis of left elbow 2018   Diabetes mellitus without complication [facility])    ED (erectile dysfunction)    Grade I diastolic dysfunction    Heart failure with mid-range ejection fraction (HCC)    a. 2010 Echo: EF 45-50%; b. 06/2012 LV Gram: EF 40%; c. 11/2020 LV Gram: EF 25-35%; d. 04/2021 Echo: EF 40-45%.   History of heart artery stent    HTN (hypertension) 04/12/2011   Hyperlipemia 04/12/2011   Ischemic cardiomyopathy    a. echo 2010: EF 45-50%, mild to mod ant and apical wall HK, trace MR; b. cardiac cath 06/2012: EF 40%, mild MR; c. 11/2020 LV Gram: EF 25-35%; d. 04/2021 Echo: EF 40-45%, ant, mid-apical antsept, apical HK. GrI DD. Nl RV fxn. RVSP 44.37mmHg. Mod dil LA. Mild-mod MR.   Mitral regurgitation    a. 04/2021 Echo: Mild-mod MR.   Multiple vessel coronary artery disease    a. remote MI 1996; b. MI 1997 s/p PCI - LAD & diag; c. MI 2009: long stenting P-MLAD & Diag & POBA of jailed diag branch; d. cath 2010: 80% ISR of LAD w/ L-R collats, med Rx;  e. cath 2012: 50-60% tub mLAD, 60-70% dLAD, FFR 0.75 -->s/p PCI dissection => w/ 2 stents (3 total); f. cath 06/2012: no changes from 2012-> med Rx; g. 11/2020 Cath: MVD-> CABG X 5 (LIMA->LAD, VG->RPDA, VG->OM1->OM2, VG->D1).   Myocardial infarction (HCC)    x 5   Nephrolithiasis    Obesity    OSA (obstructive sleep apnea)    on CPAP   Osteoarthritis    PAD (peripheral artery disease)    a. 11/2023 ABIs/TBIs: R 0.72/0.55, L 1.27/1.03.  Duplex - total occlusion of R SFA.   Premature atrial contractions    a. 10/2023 Zio: 13.2% PAC burden.   PSVT (paroxysmal supraventricular tachycardia)    a. 10/2023 Zio: 717 runs of SVT lasting up to 2 minutes and 19  seconds, with a max rate of 182 bpm.  He was also noted to have a 13.2% PAC burden.  Triggered events were associated with PACs and SVT.   Stable angina    Uncontrolled type 2 diabetes mellitus with hyperglycemia, without long-term current use of insulin  [facility])     Past Surgical History:  Procedure Laterality Date   CATARACT EXTRACTION W/PHACO Right 12/22/2023   Procedure: PHACOEMULSIFICATION, CATARACT, WITH IOL INSERTION 7.83, 00:55.5;  Surgeon: Jaye Fallow, MD;  Location: [facility];  Service: Ophthalmology;  Laterality: Right;   CATARACT EXTRACTION W/PHACO Left 01/05/2024   Procedure: PHACOEMULSIFICATION, CATARACT, WITH IOL INSERTION 8.65 01:06.1;  Surgeon: Jaye Fallow, MD;  Location: [facility];  Service: Ophthalmology;  Laterality: Left;   CORONARY ANGIOPLASTY     x 5   CORONARY ARTERY BYPASS GRAFT N/A 11/20/2020   Procedure: CORONARY ARTERY BYPASS GRAFTING (CABG) x  FIVE (LIMA-dLAD, SVG-PDA, SVG-D1, SeqSVG-OM1-2) ON PUMP  USING LEFT INTERNAL MAMMARY ARTERTY AND LEFT ENDOSCOPIC GREATER SAPHENOUS VEIN CONDUITS, RIGHT LEG OPENED NOT HARVESTED;  Surgeon: German Bartlett PEDLAR, MD;  Location: [facility] OR;  Service: Open Heart Surgery;  Laterality: N/A;   CORONARY PRESSURE/FFR STUDY N/A 11/15/2020   Procedure: INTRAVASCULAR PRESSURE WIRE/FFR STUDY;  Surgeon: Mady Bruckner, MD;  Location: [facility] INVASIVE CV LAB;  Service: Cardiovascular;  Laterality: N/A;   CORONARY STENT PLACEMENT     x 7   LEFT HEART CATH AND CORONARY ANGIOGRAPHY N/A 11/15/2020   Procedure: LEFT HEART CATH AND CORONARY ANGIOGRAPHY;  Surgeon: Mady Bruckner, MD;  Location: [facility] INVASIVE CV LAB;  Service: Cardiovascular;  Laterality: N/A;   LEFT HEART CATHETERIZATION WITH CORONARY ANGIOGRAM N/A 04/14/2011   Procedure: LEFT HEART CATHETERIZATION WITH CORONARY ANGIOGRAM;  Surgeon: Vinie KYM Maxcy, MD;  Location: [facility] CATH LAB;  Service: Cardiovascular;  Laterality: N/A;   LEFT HEART CATHETERIZATION WITH CORONARY  ANGIOGRAM N/A 06/25/2012   Procedure: LEFT HEART CATHETERIZATION WITH CORONARY ANGIOGRAM;  Surgeon: Peter M Jordan, MD;  Location: [facility] CATH LAB;  Service: Cardiovascular;  Laterality: N/A;   TEE WITHOUT CARDIOVERSION N/A 11/20/2020   Procedure: TRANSESOPHAGEAL ECHOCARDIOGRAM (TEE);  Surgeon: German Bartlett PEDLAR, MD;  Location: [facility] OR;  Service: Open Heart Surgery;  Laterality: N/A;   TONSILLECTOMY AND ADENOIDECTOMY  1955     Current Outpatient Medications  Medication Sig Dispense Refill   acetaminophen  (TYLENOL ) 500 MG tablet Take 2 tablets (1,000 mg total) by mouth every 6 (six) hours as needed. 100 tablet 2   aspirin  EC 81 MG tablet Take 1 tablet (81 mg total) by mouth daily.     blood glucose meter kit and supplies KIT Dispense based on patient and insurance preference. Use up to four times daily as directed. 1 each  0   diphenhydramine -acetaminophen  (TYLENOL  PM) 25-500 MG TABS tablet Take 1 tablet by mouth at bedtime as needed.     insulin  glargine (LANTUS ) 100 UNIT/ML Solostar Pen Inject 25 Units into the skin daily. 30 mL 2   Insulin  Pen Needle 31G X 5 MM MISC Inject insulin  via insulin  pen daily 50 each 0   ketoconazole  (NIZORAL ) 2 % cream Apply twice daily to pink, scaly areas on face, chest, arms until improved. (Patient taking differently: Apply 1 Application topically 2 (two) times daily as needed for irritation. Apply twice daily to pink, scaly areas on face, chest, arms until improved.) 60 g 3   Magnesium  250 MG TABS Take 1 tablet by mouth in the morning and at bedtime.     metFORMIN  (GLUCOPHAGE ) 1000 MG tablet Take 1 tablet (1,000 mg total) by mouth 2 (two) times daily with a meal. 180 tablet 3   Multiple Vitamins-Minerals (MULTIVITAMINS THER. W/MINERALS) TABS Take 1 tablet by mouth every morning.     niacin  (NIASPAN ) 1000 MG CR tablet Take 2 tablets (2000 mg) by mouth once daily at bedtime 180 tablet 3   nitroGLYCERIN  (NITROLINGUAL ) 0.4 MG/SPRAY spray Place 1 spray under the tongue  every 5 (five) minutes x 3 doses as needed for chest pain. 12 g 6   Semaglutide , 2 MG/DOSE, 8 MG/3ML SOPN Inject 2 mg as directed once a week. 9 mL 0   atorvastatin  (LIPITOR ) 80 MG tablet Take 1 tablet (80 mg total) by mouth daily. 90 tablet 3   carvedilol  (COREG ) 6.25 MG tablet Take 1 tablet (6.25 mg total) by mouth 2 (two) times daily with a meal. 180 tablet 3   ezetimibe  (ZETIA ) 10 MG tablet Take 1 tablet (10 mg total) by mouth daily. 90 tablet 3   furosemide  (LASIX ) 40 MG tablet Take 1 tablet (40 mg total) by mouth daily. 90 tablet 3   isosorbide  mononitrate (IMDUR ) 30 MG 24 hr tablet Take 1 tablet (30 mg total) by mouth daily. 90 tablet 3   losartan  (COZAAR ) 25 MG tablet Take 1 tablet (25 mg total) by mouth daily. 90 tablet 3   potassium chloride  SA (KLOR-CON  M) 20 MEQ tablet Take 1 tablet (20 mEq total) by mouth daily. 90 tablet 3   predniSONE  (STERAPRED UNI-PAK 21 TAB) 10 MG (21) TBPK tablet Take by mouth daily. Take 6 tabs by mouth daily  for 1 days, then 5 tabs for 1 days, then 4 tabs for 1 days, then 3 tabs for 1 days, 2 tabs for 1 days, then 1 tab by mouth daily for 1 days (Patient not taking: Reported on 04/28/2024) 21 tablet 0   No current facility-administered medications for this visit.    Allergies:   Hydroxyzine hcl and Latex    Social History:  The patient  reports that he quit smoking about 16 years ago. His smoking use included cigarettes. He started smoking about 56 years ago. He has a 60 pack-year smoking history. He has never used smokeless tobacco. He reports that he does not currently use drugs after having used the following drugs: Marijuana. He reports that he does not drink alcohol.   Family History:  The patient's family history includes Alcoholism in his father; COPD in his mother; Heart block in his sister; Heart disease in his father; Other in his father; Prostate cancer in his father; Stroke in his father and sister.    ROS:  Please see the history of present  illness.   Otherwise, review of  systems are positive for none.   All other systems are reviewed and negative.    PHYSICAL EXAM: VS:  BP 120/74 (BP Location: Left Arm, Patient Position: Sitting, Cuff Size: Normal)   Pulse 86   Ht 5' 8 (1.727 m)   Wt 189 lb 2 oz (85.8 kg)   SpO2 99%   BMI 28.76 kg/m  , BMI Body mass index is 28.76 kg/m. GEN: Well nourished, well developed, in no acute distress  HEENT: normal  Neck: no JVD, carotid bruits, or masses Cardiac: RRR; no murmurs, rubs, or gallops,no edema  Respiratory:  clear to auscultation bilaterally, normal work of breathing GI: soft, nontender, nondistended, + BS MS: no deformity or atrophy  Skin: warm and dry, no rash Neuro:  Strength and sensation are intact Psych: euthymic mood, full affect   EKG:  EKG is ordered today. The ekg ordered today demonstrates : Normal sinus rhythm Septal infarct (cited on or before 15-Nov-2020) Cannot rule out Inferior infarct (cited on or before 15-Nov-2020) When compared with ECG of 23-Mar-2024 10:53, Questionable change in initial forces of Septal leads    Recent Labs: 08/18/2023: Hemoglobin 14.4; Platelets 183 11/16/2023: ALT 16; BUN 15; Creatinine, Ser 1.08; Potassium 4.3; Sodium 137    Lipid Panel    Component Value Date/Time   CHOL 181 11/16/2023 1120   CHOL 95 (L) 04/10/2021 0756   TRIG 124.0 11/16/2023 1120   HDL 40.80 11/16/2023 1120   HDL 32 (L) 04/10/2021 0756   CHOLHDL 4 11/16/2023 1120   VLDL 24.8 11/16/2023 1120   LDLCALC 115 (H) 11/16/2023 1120   LDLCALC 36 04/02/2022 1429   LDLDIRECT 71.0 07/11/2019 1424      Wt Readings from Last 3 Encounters:  04/28/24 189 lb 2 oz (85.8 kg)  04/21/24 189 lb 3.2 oz (85.8 kg)  03/23/24 193 lb (87.5 kg)           No data to display            ASSESSMENT AND PLAN:  1.  Peripheral arterial disease: Severe right calf claudication Rutherford class III.  He continues to be severely limited by this and has not been able to  advance his activities at all.  He cut down on his daily walking program due to his symptoms. Given severity of his symptoms, recommend proceeding with abdominal aortogram with right lower extremity angiography and possible endovascular intervention.  I discussed the procedure in details as well as risks and benefits.  Planned access is via the left common femoral artery  2.  Coronary artery disease involving native coronary arteries with other forms of angina: His chest pain is atypical overall and is not consistent with his prior angina.  I discussed the results of recent cardiac PET scan with him which seems to be consistent with his prior studies showing prior anterior infarct with no significant ischemia.  I recommend continuing medical therapy.    3.  Chronic systolic heart failure with reduced LV systolic function: No evidence of volume overload and he seems to be well compensated.  Previous EF in 2022 was 30 to 35%.  This improved subsequently to 40 to 45%.  On cardiac PET scan his EF was 36%.  Borderline indication for an ICD placement but should continue medical therapy for now.  4.  Essential hypertension: Blood pressure is controlled.  5.  Hyperlipidemia: Most recent LDL was elevated but he was not taking atorvastatin  at that time by mistake.  Since then, he resumed atorvastatin .  Recommended target LDL of less than 55.    Disposition: Proceed with lower extremity angiography and follow-up after.  Signed,  Deatrice Cage, MD  04/28/2024 10:55 AM    [facility]

## 2024-04-28 NOTE — Patient Instructions (Signed)
 Medication Instructions:  No changes *If you need a refill on your cardiac medications before your next appointment, please call your pharmacy*  Lab Work: Your provider would like for you to have the following labs today: CBC and BMET  If you have labs (blood work) drawn today and your tests are completely normal, you will receive your results only by: MyChart Message (if you have MyChart) OR A paper copy in the mail If you have any lab test that is abnormal or we need to change your treatment, we will call you to review the results.   Follow-Up: At [facility], you and your health needs are our priority.  As part of our continuing mission to provide you with exceptional heart care, our providers are all part of one team.  This team includes your primary Cardiologist (physician) and Advanced Practice Providers or APPs (Physician Assistants and Nurse Practitioners) who all work together to provide you with the care you need, when you need it.  Your next appointment:   2 month(s)  Provider:   Deatrice Cage, MD    We recommend signing up for the patient portal called MyChart.  Sign up information is provided on this After Visit Summary.  MyChart is used to connect with patients for Virtual Visits (Telemedicine).  Patients are able to view lab/test results, encounter notes, upcoming appointments, etc.  Non-urgent messages can be sent to your provider as well.   To learn more about what you can do with MyChart, go to [email or web address].   Other Instructions  [facility] A DEPT OF [place] AT [facility] [address] [place] [address] Dept: [phone] Loc: [phone]  [name]  04/28/2024  You are scheduled for a Peripheral Angiogram on Friday, January 16 with Dr. Deatrice Cage.  1. Please arrive at the [facility] (Main Entrance A) at [facility]: [address] [place], [place] [address] at 5:30 AM (This time is 2 hour(s) before your procedure to ensure your preparation).   Free valet parking service is available. You will check in at ADMITTING. The support person will be asked to wait in the waiting room.  It is OK to have someone drop you off and come back when you are ready to be discharged.    Special note: Every effort is made to have your procedure done on time. Please understand that emergencies sometimes delay scheduled procedures.  2. Diet: Nothing to eat after midnight.   3. Hydration: You need to be well hydrated before your procedure. On January 16, you may drink approved liquids (see below) until 2 hours before the procedure, with 16 oz of water  as your last intake.   List of approved liquids water , clear juice, clear tea, black coffee, fruit juices, non-citric and without pulp, carbonated beverages, Gatorade, Kool -Aid, plain Jello-O and plain ice popsicles.  4. Labs: You will need to have blood drawn on 12/18/25You do not need to be fasting.  5. Medication instructions in preparation for your procedure: Hold all diabetic medication the morning of the procedure  -hold the metformin  the morning of the procedure and then 48 hours after  -take half the dose of the Lantus  the night before the procedure Hold the Furosemide  and potassium the morning of the procedure  On the morning of your procedure, take your Aspirin  81 mg and any morning medicines NOT listed above.  You may use sips of water .  6. Plan to go home the same day, you will only stay overnight if medically necessary. 7. Bring a current list of your medications and current insurance cards. 8. You MUST have a responsible person to drive you home. 9. Someone MUST be with you the first 24 hours after you arrive home or your discharge will be delayed. 10. Please wear clothes that are easy to get on and off and wear slip-on shoes.  Thank you for allowing us  to care for you!    -- [facility]

## 2024-04-28 NOTE — H&P (View-Only) (Signed)
 Cardiology Office Note   Date:  04/28/2024   ID:  [name], DOB 11-23-1950, MRN [id number]  PCP:  [facility], [place], MD  Cardiologist:  Dr. Perla  Chief Complaint  Patient presents with   Follow-up    F/u PET/CT c/o dull ache chest discomfort. Meds reviewed verbally with pt.      History of Present Illness: [name] is a 73 y.o. male who is here today for a follow-up visit regarding peripheral arterial disease.   He has known history of coronary artery disease status post multiple PCI's and most recently CABG in 2022, chronic systolic heart failure with mildly reduced LV systolic function, essential hypertension, hyperlipidemia, obstructive sleep apnea, type 2 diabetes and prostate cancer. He was seen most recently in May for palpitations and right calf claudication.  Outpatient monitor showed frequent short runs of SVT the longest lasted 19 seconds with frequent PACs with a burden of 13%.  Lower extremity arterial Doppler showed an ABI of 0.72 on the right and normal on the left.  Duplex showed occluded distal right SFA. He has right calf claudication that happens after short distance walking.   He used to walk 2-1/2 miles per day with no issues but has not been able to do that over the last 2 years.  In addition, his symptoms have been slowly progressing to the point that he gets discomfort after walking less than 50 feet.    He was seen in October for chest pain in the setting of emotional stress.  He underwent cardiac PET scan which was intermediate risk study with evidence of fixed anterior/anteroseptal defect with minimal reversibility.  EF was 35%.  Anterior wall defect correlates with known history of prior anterior MI. He describes aching sensation substernally that seems to last hours at a time and does not get worse with exertion.  This is different from his prior angina.  He is mostly limited by severe right calf claudication which is happening with  minimal walking.  No rest pain or lower extremity ulceration  Past Medical History:  Diagnosis Date   Actinic keratoses    Angina    Asthma    BCC (basal cell carcinoma of skin) 09/06/2020   left upper back lateral (EDC) ,  left upper arm anterior (EDC), left upper back medial (EDC)    Bursitis of left elbow 2018   Diabetes mellitus without complication [facility])    ED (erectile dysfunction)    Grade I diastolic dysfunction    Heart failure with mid-range ejection fraction (HCC)    a. 2010 Echo: EF 45-50%; b. 06/2012 LV Gram: EF 40%; c. 11/2020 LV Gram: EF 25-35%; d. 04/2021 Echo: EF 40-45%.   History of heart artery stent    HTN (hypertension) 04/12/2011   Hyperlipemia 04/12/2011   Ischemic cardiomyopathy    a. echo 2010: EF 45-50%, mild to mod ant and apical wall HK, trace MR; b. cardiac cath 06/2012: EF 40%, mild MR; c. 11/2020 LV Gram: EF 25-35%; d. 04/2021 Echo: EF 40-45%, ant, mid-apical antsept, apical HK. GrI DD. Nl RV fxn. RVSP 44.37mmHg. Mod dil LA. Mild-mod MR.   Mitral regurgitation    a. 04/2021 Echo: Mild-mod MR.   Multiple vessel coronary artery disease    a. remote MI 1996; b. MI 1997 s/p PCI - LAD & diag; c. MI 2009: long stenting P-MLAD & Diag & POBA of jailed diag branch; d. cath 2010: 80% ISR of LAD w/ L-R collats, med Rx;  e. cath 2012: 50-60% tub mLAD, 60-70% dLAD, FFR 0.75 -->s/p PCI dissection => w/ 2 stents (3 total); f. cath 06/2012: no changes from 2012-> med Rx; g. 11/2020 Cath: MVD-> CABG X 5 (LIMA->LAD, VG->RPDA, VG->OM1->OM2, VG->D1).   Myocardial infarction (HCC)    x 5   Nephrolithiasis    Obesity    OSA (obstructive sleep apnea)    on CPAP   Osteoarthritis    PAD (peripheral artery disease)    a. 11/2023 ABIs/TBIs: R 0.72/0.55, L 1.27/1.03.  Duplex - total occlusion of R SFA.   Premature atrial contractions    a. 10/2023 Zio: 13.2% PAC burden.   PSVT (paroxysmal supraventricular tachycardia)    a. 10/2023 Zio: 717 runs of SVT lasting up to 2 minutes and 19  seconds, with a max rate of 182 bpm.  He was also noted to have a 13.2% PAC burden.  Triggered events were associated with PACs and SVT.   Stable angina    Uncontrolled type 2 diabetes mellitus with hyperglycemia, without long-term current use of insulin  [facility] [address])     Past Surgical History:  Procedure Laterality Date   CATARACT EXTRACTION W/PHACO Right 12/22/2023   Procedure: PHACOEMULSIFICATION, CATARACT, WITH IOL INSERTION 7.83, 00:55.5;  Surgeon: Jaye Fallow, MD;  Location: [facility];  Service: Ophthalmology;  Laterality: Right;   CATARACT EXTRACTION W/PHACO Left 01/05/2024   Procedure: PHACOEMULSIFICATION, CATARACT, WITH IOL INSERTION 8.65 01:06.1;  Surgeon: Jaye Fallow, MD;  Location: [facility];  Service: Ophthalmology;  Laterality: Left;   CORONARY ANGIOPLASTY     x 5   CORONARY ARTERY BYPASS GRAFT N/A 11/20/2020   Procedure: CORONARY ARTERY BYPASS GRAFTING (CABG) x  FIVE (LIMA-dLAD, SVG-PDA, SVG-D1, SeqSVG-OM1-2) ON PUMP  USING LEFT INTERNAL MAMMARY ARTERTY AND LEFT ENDOSCOPIC GREATER SAPHENOUS VEIN CONDUITS, RIGHT LEG OPENED NOT HARVESTED;  Surgeon: German Bartlett PEDLAR, MD;  Location: [facility] OR;  Service: Open Heart Surgery;  Laterality: N/A;   CORONARY PRESSURE/FFR STUDY N/A 11/15/2020   Procedure: INTRAVASCULAR PRESSURE WIRE/FFR STUDY;  Surgeon: Mady Bruckner, MD;  Location: [facility] INVASIVE CV LAB;  Service: Cardiovascular;  Laterality: N/A;   CORONARY STENT PLACEMENT     x 7   LEFT HEART CATH AND CORONARY ANGIOGRAPHY N/A 11/15/2020   Procedure: LEFT HEART CATH AND CORONARY ANGIOGRAPHY;  Surgeon: Mady Bruckner, MD;  Location: [facility] INVASIVE CV LAB;  Service: Cardiovascular;  Laterality: N/A;   LEFT HEART CATHETERIZATION WITH CORONARY ANGIOGRAM N/A 04/14/2011   Procedure: LEFT HEART CATHETERIZATION WITH CORONARY ANGIOGRAM;  Surgeon: Vinie KYM Maxcy, MD;  Location: [facility] CATH LAB;  Service: Cardiovascular;  Laterality: N/A;   LEFT HEART CATHETERIZATION WITH CORONARY  ANGIOGRAM N/A 06/25/2012   Procedure: LEFT HEART CATHETERIZATION WITH CORONARY ANGIOGRAM;  Surgeon: Peter M Jordan, MD;  Location: [facility] CATH LAB;  Service: Cardiovascular;  Laterality: N/A;   TEE WITHOUT CARDIOVERSION N/A 11/20/2020   Procedure: TRANSESOPHAGEAL ECHOCARDIOGRAM (TEE);  Surgeon: German Bartlett PEDLAR, MD;  Location: [facility] OR;  Service: Open Heart Surgery;  Laterality: N/A;   TONSILLECTOMY AND ADENOIDECTOMY  1955     Current Outpatient Medications  Medication Sig Dispense Refill   acetaminophen  (TYLENOL ) 500 MG tablet Take 2 tablets (1,000 mg total) by mouth every 6 (six) hours as needed. 100 tablet 2   aspirin  EC 81 MG tablet Take 1 tablet (81 mg total) by mouth daily.     blood glucose meter kit and supplies KIT Dispense based on patient and insurance preference. Use up to four times daily as directed. 1 each  0   diphenhydramine -acetaminophen  (TYLENOL  PM) 25-500 MG TABS tablet Take 1 tablet by mouth at bedtime as needed.     insulin  glargine (LANTUS ) 100 UNIT/ML Solostar Pen Inject 25 Units into the skin daily. 30 mL 2   Insulin  Pen Needle 31G X 5 MM MISC Inject insulin  via insulin  pen daily 50 each 0   ketoconazole  (NIZORAL ) 2 % cream Apply twice daily to pink, scaly areas on face, chest, arms until improved. (Patient taking differently: Apply 1 Application topically 2 (two) times daily as needed for irritation. Apply twice daily to pink, scaly areas on face, chest, arms until improved.) 60 g 3   Magnesium  250 MG TABS Take 1 tablet by mouth in the morning and at bedtime.     metFORMIN  (GLUCOPHAGE ) 1000 MG tablet Take 1 tablet (1,000 mg total) by mouth 2 (two) times daily with a meal. 180 tablet 3   Multiple Vitamins-Minerals (MULTIVITAMINS THER. W/MINERALS) TABS Take 1 tablet by mouth every morning.     niacin  (NIASPAN ) 1000 MG CR tablet Take 2 tablets (2000 mg) by mouth once daily at bedtime 180 tablet 3   nitroGLYCERIN  (NITROLINGUAL ) 0.4 MG/SPRAY spray Place 1 spray under the tongue  every 5 (five) minutes x 3 doses as needed for chest pain. 12 g 6   Semaglutide , 2 MG/DOSE, 8 MG/3ML SOPN Inject 2 mg as directed once a week. 9 mL 0   atorvastatin  (LIPITOR ) 80 MG tablet Take 1 tablet (80 mg total) by mouth daily. 90 tablet 3   carvedilol  (COREG ) 6.25 MG tablet Take 1 tablet (6.25 mg total) by mouth 2 (two) times daily with a meal. 180 tablet 3   ezetimibe  (ZETIA ) 10 MG tablet Take 1 tablet (10 mg total) by mouth daily. 90 tablet 3   furosemide  (LASIX ) 40 MG tablet Take 1 tablet (40 mg total) by mouth daily. 90 tablet 3   isosorbide  mononitrate (IMDUR ) 30 MG 24 hr tablet Take 1 tablet (30 mg total) by mouth daily. 90 tablet 3   losartan  (COZAAR ) 25 MG tablet Take 1 tablet (25 mg total) by mouth daily. 90 tablet 3   potassium chloride  SA (KLOR-CON  M) 20 MEQ tablet Take 1 tablet (20 mEq total) by mouth daily. 90 tablet 3   predniSONE  (STERAPRED UNI-PAK 21 TAB) 10 MG (21) TBPK tablet Take by mouth daily. Take 6 tabs by mouth daily  for 1 days, then 5 tabs for 1 days, then 4 tabs for 1 days, then 3 tabs for 1 days, 2 tabs for 1 days, then 1 tab by mouth daily for 1 days (Patient not taking: Reported on 04/28/2024) 21 tablet 0   No current facility-administered medications for this visit.    Allergies:   Hydroxyzine hcl and Latex    Social History:  The patient  reports that he quit smoking about 16 years ago. His smoking use included cigarettes. He started smoking about 56 years ago. He has a 60 pack-year smoking history. He has never used smokeless tobacco. He reports that he does not currently use drugs after having used the following drugs: Marijuana. He reports that he does not drink alcohol.   Family History:  The patient's family history includes Alcoholism in his father; COPD in his mother; Heart block in his sister; Heart disease in his father; Other in his father; Prostate cancer in his father; Stroke in his father and sister.    ROS:  Please see the history of present  illness.   Otherwise, review of  systems are positive for none.   All other systems are reviewed and negative.    PHYSICAL EXAM: VS:  BP 120/74 (BP Location: Left Arm, Patient Position: Sitting, Cuff Size: Normal)   Pulse 86   Ht 5' 8 (1.727 m)   Wt 189 lb 2 oz (85.8 kg)   SpO2 99%   BMI 28.76 kg/m  , BMI Body mass index is 28.76 kg/m. GEN: Well nourished, well developed, in no acute distress  HEENT: normal  Neck: no JVD, carotid bruits, or masses Cardiac: RRR; no murmurs, rubs, or gallops,no edema  Respiratory:  clear to auscultation bilaterally, normal work of breathing GI: soft, nontender, nondistended, + BS MS: no deformity or atrophy  Skin: warm and dry, no rash Neuro:  Strength and sensation are intact Psych: euthymic mood, full affect   EKG:  EKG is ordered today. The ekg ordered today demonstrates : Normal sinus rhythm Septal infarct (cited on or before 15-Nov-2020) Cannot rule out Inferior infarct (cited on or before 15-Nov-2020) When compared with ECG of 23-Mar-2024 10:53, Questionable change in initial forces of Septal leads    Recent Labs: 08/18/2023: Hemoglobin 14.4; Platelets 183 11/16/2023: ALT 16; BUN 15; Creatinine, Ser 1.08; Potassium 4.3; Sodium 137    Lipid Panel    Component Value Date/Time   CHOL 181 11/16/2023 1120   CHOL 95 (L) 04/10/2021 0756   TRIG 124.0 11/16/2023 1120   HDL 40.80 11/16/2023 1120   HDL 32 (L) 04/10/2021 0756   CHOLHDL 4 11/16/2023 1120   VLDL 24.8 11/16/2023 1120   LDLCALC 115 (H) 11/16/2023 1120   LDLCALC 36 04/02/2022 1429   LDLDIRECT 71.0 07/11/2019 1424      Wt Readings from Last 3 Encounters:  04/28/24 189 lb 2 oz (85.8 kg)  04/21/24 189 lb 3.2 oz (85.8 kg)  03/23/24 193 lb (87.5 kg)           No data to display            ASSESSMENT AND PLAN:  1.  Peripheral arterial disease: Severe right calf claudication Rutherford class III.  He continues to be severely limited by this and has not been able to  advance his activities at all.  He cut down on his daily walking program due to his symptoms. Given severity of his symptoms, recommend proceeding with abdominal aortogram with right lower extremity angiography and possible endovascular intervention.  I discussed the procedure in details as well as risks and benefits.  Planned access is via the left common femoral artery  2.  Coronary artery disease involving native coronary arteries with other forms of angina: His chest pain is atypical overall and is not consistent with his prior angina.  I discussed the results of recent cardiac PET scan with him which seems to be consistent with his prior studies showing prior anterior infarct with no significant ischemia.  I recommend continuing medical therapy.    3.  Chronic systolic heart failure with reduced LV systolic function: No evidence of volume overload and he seems to be well compensated.  Previous EF in 2022 was 30 to 35%.  This improved subsequently to 40 to 45%.  On cardiac PET scan his EF was 36%.  Borderline indication for an ICD placement but should continue medical therapy for now.  4.  Essential hypertension: Blood pressure is controlled.  5.  Hyperlipidemia: Most recent LDL was elevated but he was not taking atorvastatin  at that time by mistake.  Since then, he resumed atorvastatin .  Recommended target LDL of less than 55.    Disposition: Proceed with lower extremity angiography and follow-up after.  Signed,  Deatrice Cage, MD  04/28/2024 10:55 AM    [facility]

## 2024-04-29 ENCOUNTER — Inpatient Hospital Stay

## 2024-04-29 ENCOUNTER — Other Ambulatory Visit: Payer: Self-pay | Admitting: Internal Medicine

## 2024-04-29 ENCOUNTER — Inpatient Hospital Stay: Admitting: Oncology

## 2024-04-29 ENCOUNTER — Encounter: Payer: Self-pay | Admitting: Oncology

## 2024-04-29 ENCOUNTER — Ambulatory Visit: Payer: Self-pay | Admitting: Cardiovascular Disease

## 2024-04-29 VITALS — BP 137/76 | HR 91 | Temp 97.9°F | Resp 19 | Ht 68.0 in | Wt 188.4 lb

## 2024-04-29 DIAGNOSIS — E1165 Type 2 diabetes mellitus with hyperglycemia: Secondary | ICD-10-CM

## 2024-04-29 DIAGNOSIS — E291 Testicular hypofunction: Secondary | ICD-10-CM

## 2024-04-29 DIAGNOSIS — C61 Malignant neoplasm of prostate: Secondary | ICD-10-CM

## 2024-04-29 DIAGNOSIS — N135 Crossing vessel and stricture of ureter without hydronephrosis: Secondary | ICD-10-CM | POA: Diagnosis not present

## 2024-04-29 DIAGNOSIS — Z85828 Personal history of other malignant neoplasm of skin: Secondary | ICD-10-CM | POA: Diagnosis not present

## 2024-04-29 DIAGNOSIS — E785 Hyperlipidemia, unspecified: Secondary | ICD-10-CM | POA: Diagnosis not present

## 2024-04-29 DIAGNOSIS — Z87891 Personal history of nicotine dependence: Secondary | ICD-10-CM | POA: Diagnosis not present

## 2024-04-29 DIAGNOSIS — M816 Localized osteoporosis [Lequesne]: Secondary | ICD-10-CM | POA: Diagnosis not present

## 2024-04-29 DIAGNOSIS — Z9089 Acquired absence of other organs: Secondary | ICD-10-CM | POA: Diagnosis not present

## 2024-04-29 DIAGNOSIS — I10 Essential (primary) hypertension: Secondary | ICD-10-CM | POA: Diagnosis not present

## 2024-04-29 DIAGNOSIS — Z7982 Long term (current) use of aspirin: Secondary | ICD-10-CM | POA: Diagnosis not present

## 2024-04-29 DIAGNOSIS — Z91040 Latex allergy status: Secondary | ICD-10-CM | POA: Diagnosis not present

## 2024-04-29 DIAGNOSIS — Z87442 Personal history of urinary calculi: Secondary | ICD-10-CM | POA: Diagnosis not present

## 2024-04-29 DIAGNOSIS — Z6372 Alcoholism and drug addiction in family: Secondary | ICD-10-CM | POA: Diagnosis not present

## 2024-04-29 DIAGNOSIS — Z825 Family history of asthma and other chronic lower respiratory diseases: Secondary | ICD-10-CM | POA: Diagnosis not present

## 2024-04-29 DIAGNOSIS — Z9841 Cataract extraction status, right eye: Secondary | ICD-10-CM | POA: Diagnosis not present

## 2024-04-29 DIAGNOSIS — R911 Solitary pulmonary nodule: Secondary | ICD-10-CM | POA: Diagnosis not present

## 2024-04-29 DIAGNOSIS — Z823 Family history of stroke: Secondary | ICD-10-CM | POA: Diagnosis not present

## 2024-04-29 DIAGNOSIS — Z79899 Other long term (current) drug therapy: Secondary | ICD-10-CM | POA: Diagnosis not present

## 2024-04-29 DIAGNOSIS — I2510 Atherosclerotic heart disease of native coronary artery without angina pectoris: Secondary | ICD-10-CM | POA: Diagnosis not present

## 2024-04-29 DIAGNOSIS — Z923 Personal history of irradiation: Secondary | ICD-10-CM | POA: Diagnosis not present

## 2024-04-29 DIAGNOSIS — Z8249 Family history of ischemic heart disease and other diseases of the circulatory system: Secondary | ICD-10-CM | POA: Diagnosis not present

## 2024-04-29 DIAGNOSIS — Z811 Family history of alcohol abuse and dependence: Secondary | ICD-10-CM | POA: Diagnosis not present

## 2024-04-29 DIAGNOSIS — E119 Type 2 diabetes mellitus without complications: Secondary | ICD-10-CM | POA: Diagnosis not present

## 2024-04-29 DIAGNOSIS — Z9842 Cataract extraction status, left eye: Secondary | ICD-10-CM | POA: Diagnosis not present

## 2024-04-29 DIAGNOSIS — I252 Old myocardial infarction: Secondary | ICD-10-CM | POA: Diagnosis not present

## 2024-04-29 LAB — CMP (CANCER CENTER ONLY)
ALT: 91 U/L — ABNORMAL HIGH (ref 0–44)
AST: 38 U/L (ref 15–41)
Albumin: 4.2 g/dL (ref 3.5–5.0)
Alkaline Phosphatase: 96 U/L (ref 38–126)
Anion gap: 10 (ref 5–15)
BUN: 14 mg/dL (ref 8–23)
CO2: 26 mmol/L (ref 22–32)
Calcium: 9.8 mg/dL (ref 8.9–10.3)
Chloride: 100 mmol/L (ref 98–111)
Creatinine: 1.38 mg/dL — ABNORMAL HIGH (ref 0.61–1.24)
GFR, Estimated: 54 mL/min — ABNORMAL LOW
Glucose, Bld: 288 mg/dL — ABNORMAL HIGH (ref 70–99)
Potassium: 4.3 mmol/L (ref 3.5–5.1)
Sodium: 137 mmol/L (ref 135–145)
Total Bilirubin: 1.3 mg/dL — ABNORMAL HIGH (ref 0.0–1.2)
Total Protein: 6.8 g/dL (ref 6.5–8.1)

## 2024-04-29 LAB — CBC WITH DIFFERENTIAL (CANCER CENTER ONLY)
Abs Immature Granulocytes: 0.01 K/uL (ref 0.00–0.07)
Basophils Absolute: 0 K/uL (ref 0.0–0.1)
Basophils Relative: 1 %
Eosinophils Absolute: 0.1 K/uL (ref 0.0–0.5)
Eosinophils Relative: 2 %
HCT: 39.7 % (ref 39.0–52.0)
Hemoglobin: 13.3 g/dL (ref 13.0–17.0)
Immature Granulocytes: 0 %
Lymphocytes Relative: 32 %
Lymphs Abs: 1.3 K/uL (ref 0.7–4.0)
MCH: 30.7 pg (ref 26.0–34.0)
MCHC: 33.5 g/dL (ref 30.0–36.0)
MCV: 91.7 fL (ref 80.0–100.0)
Monocytes Absolute: 0.4 K/uL (ref 0.1–1.0)
Monocytes Relative: 10 %
Neutro Abs: 2.3 K/uL (ref 1.7–7.7)
Neutrophils Relative %: 55 %
Platelet Count: 151 K/uL (ref 150–400)
RBC: 4.33 MIL/uL (ref 4.22–5.81)
RDW: 13.2 % (ref 11.5–15.5)
WBC Count: 4.2 K/uL (ref 4.0–10.5)
nRBC: 0 % (ref 0.0–0.2)

## 2024-04-29 LAB — BASIC METABOLIC PANEL WITH GFR
BUN/Creatinine Ratio: 13 (ref 10–24)
BUN: 15 mg/dL (ref 8–27)
CO2: 23 mmol/L (ref 20–29)
Calcium: 9.9 mg/dL (ref 8.6–10.2)
Chloride: 99 mmol/L (ref 96–106)
Creatinine, Ser: 1.2 mg/dL (ref 0.76–1.27)
Glucose: 210 mg/dL — ABNORMAL HIGH (ref 70–99)
Potassium: 4.9 mmol/L (ref 3.5–5.2)
Sodium: 137 mmol/L (ref 134–144)
eGFR: 64 mL/min/1.73

## 2024-04-29 LAB — CBC
Hematocrit: 43.5 % (ref 37.5–51.0)
Hemoglobin: 14.7 g/dL (ref 13.0–17.7)
MCH: 31.5 pg (ref 26.6–33.0)
MCHC: 33.8 g/dL (ref 31.5–35.7)
MCV: 93 fL (ref 79–97)
Platelets: 156 x10E3/uL (ref 150–450)
RBC: 4.67 x10E6/uL (ref 4.14–5.80)
RDW: 13 % (ref 11.6–15.4)
WBC: 4.6 x10E3/uL (ref 3.4–10.8)

## 2024-04-29 LAB — PSA: Prostatic Specific Antigen: 6.42 ng/mL — ABNORMAL HIGH (ref 0.00–4.00)

## 2024-04-29 MED ORDER — LEUPROLIDE ACETATE (3 MONTH) 22.5 MG ~~LOC~~ KIT
22.5000 mg | PACK | Freq: Once | SUBCUTANEOUS | Status: AC
  Administered 2024-04-29: 22.5 mg via SUBCUTANEOUS
  Filled 2024-04-29: qty 22.5

## 2024-04-29 NOTE — Progress Notes (Signed)
 Patient has no new or acute concerns at this time.

## 2024-04-29 NOTE — Progress Notes (Signed)
 "    Hematology/Oncology Consult note [facility]  [phone] [phone]  Patient Care Team: Pccm, Fenton, MD as PCP - General (Internal Medicine) Perla Evalene PARAS, MD as PCP - Cardiology (Cardiology) Perla Evalene PARAS, MD as Consulting Physician (Cardiology) Maribeth Camellia MATSU, MD (Inactive) as Consulting Physician (Family Medicine) Vernetta Lonni GRADE, MD as Consulting Physician (Orthopedic Surgery) [facility] (Dermatology) Penne Knee, MD (Inactive) as Consulting Physician (Urology) Melanee Annah BROCKS, MD as Consulting Physician (Oncology)   Name of the patient: [name]  [id number]  May 02, 1951   Date of visit: 04/29/2024  Diagnosis-  Cancer Staging  Prostate cancer [facility]) Staging form: Prostate, AJCC 7th Edition - Clinical stage from 10/30/2023: Stage IIB (T2c, N0, M0) - Signed by Melanee Annah BROCKS, MD on 10/30/2023    Chief complaint/ Reason for visit- discuss further management of prostate cancer  Heme/Onc history:  Patient is a 73 year old male who was diagnosed with prostate cancer in March 2023.  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  He is s/p gold seed implant in April 2023.  He also received IMRT radiation therapy for his prostate gland back then in April 2023.  He received Lupron  in April 2023 1 dose and none since then.  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  His PSA was noted to be 2.3 in May 2018 and following ADT his PSA went down to less than 0.1 and October 2023.  Since then there has been a gradual increase in his PSA from 0.3-1 0.1-2.2 in May 2025.Patient had a PSMA PET scan in June 2025 which showed mild activity in the prostate gland which was nonspecific but no evidence of metastatic disease.  5 mm right lower lobe lung nodule.    Interval history- [name] is a 73 year old male with prostate  cancer, post-radiation, who presents for oncology follow-up due to biochemical recurrence with rising PSA.  He completed radiation therapy for prostate cancer in April 2023. Following treatment, his PSA initially decreased but has gradually increased over the past 18 months, rising from 2.2 to 4.6, and most recently to 5.1 as of last month. PSMA PET scan in October 2025 demonstrated uptake in the prostate region without evidence of metastatic disease. He denies new bone pain, constitutional symptoms, or other signs of metastatic progression.  He previously received androgen deprivation therapy and found the experience unpleasant. Potential side effects of ADT include fatigue, hot flashes, mood changes, and increased risk for osteoporosis.  He has a history of coronary artery disease with multiple prior myocardial infarctions. He was referred by Dr. He was referred by Dr. Camelia for further oncologic management.     ECOG PS- 1 Pain scale- 0   Review of systems- Review of Systems  Constitutional:  Negative for chills, fever, malaise/fatigue and weight loss.  HENT:  Negative for congestion, ear discharge and nosebleeds.   Eyes:  Negative for blurred vision.  Respiratory:  Negative for cough, hemoptysis, sputum production, shortness of breath and wheezing.   Cardiovascular:  Negative for chest pain, palpitations, orthopnea and claudication.  Gastrointestinal:  Negative for abdominal pain, blood in stool, constipation, diarrhea, heartburn, melena, nausea and vomiting.  Genitourinary:  Negative for dysuria, flank pain, frequency, hematuria and urgency.  Musculoskeletal:  Negative for back pain, joint pain and myalgias.  Skin:  Negative for rash.  Neurological:  Negative for dizziness, tingling, focal weakness, seizures, weakness and headaches.  Endo/Heme/Allergies:  Does not bruise/bleed easily.  Psychiatric/Behavioral:  Negative for depression and suicidal ideas. The patient does not have  insomnia.       Allergies[1]   Past Medical History:  Diagnosis Date   Actinic keratoses    Angina    Asthma    BCC (basal cell carcinoma of skin) 09/06/2020   left upper back lateral (EDC) ,  left upper arm anterior (EDC), left upper back medial (EDC)    Bursitis of left elbow 2018   Diabetes mellitus without complication [facility])    ED (erectile dysfunction)    Grade I diastolic dysfunction    Heart failure with mid-range ejection fraction (HCC)    a. 2010 Echo: EF 45-50%; b. 06/2012 LV Gram: EF 40%; c. 11/2020 LV Gram: EF 25-35%; d. 04/2021 Echo: EF 40-45%.   History of heart artery stent    HTN (hypertension) 04/12/2011   Hyperlipemia 04/12/2011   Ischemic cardiomyopathy    a. echo 2010: EF 45-50%, mild to mod ant and apical wall HK, trace MR; b. cardiac cath 06/2012: EF 40%, mild MR; c. 11/2020 LV Gram: EF 25-35%; d. 04/2021 Echo: EF 40-45%, ant, mid-apical antsept, apical HK. GrI DD. Nl RV fxn. RVSP 44.75mmHg. Mod dil LA. Mild-mod MR.   Mitral regurgitation    a. 04/2021 Echo: Mild-mod MR.   Multiple vessel coronary artery disease    a. remote MI 1996; b. MI 1997 s/p PCI - LAD & diag; c. MI 2009: long stenting P-MLAD & Diag & POBA of jailed diag branch; d. cath 2010: 80% ISR of LAD w/ L-R collats, med Rx; e. cath 2012: 50-60% tub mLAD, 60-70% dLAD, FFR 0.75 -->s/p PCI dissection => w/ 2 stents (3 total); f. cath 06/2012: no changes from 2012-> med Rx; g. 11/2020 Cath: MVD-> CABG X 5 (LIMA->LAD, VG->RPDA, VG->OM1->OM2, VG->D1).   Myocardial infarction (HCC)    x 5   Nephrolithiasis    Obesity    OSA (obstructive sleep apnea)    on CPAP   Osteoarthritis    PAD (peripheral artery disease)    a. 11/2023 ABIs/TBIs: R 0.72/0.55, L 1.27/1.03.  Duplex - total occlusion of R SFA.   Premature atrial contractions    a. 10/2023 Zio: 13.2% PAC burden.   PSVT (paroxysmal supraventricular tachycardia)    a. 10/2023 Zio: 717 runs of SVT lasting up to 2 minutes and 19 seconds, with a max rate of 182  bpm.  He was also noted to have a 13.2% PAC burden.  Triggered events were associated with PACs and SVT.   Stable angina    Uncontrolled type 2 diabetes mellitus with hyperglycemia, without long-term current use of insulin  [facility])      Past Surgical History:  Procedure Laterality Date   CATARACT EXTRACTION W/PHACO Right 12/22/2023   Procedure: PHACOEMULSIFICATION, CATARACT, WITH IOL INSERTION 7.83, 00:55.5;  Surgeon: Jaye Fallow, MD;  Location: [facility];  Service: Ophthalmology;  Laterality: Right;   CATARACT EXTRACTION W/PHACO Left 01/05/2024   Procedure: PHACOEMULSIFICATION, CATARACT, WITH IOL INSERTION 8.65 01:06.1;  Surgeon: Jaye Fallow, MD;  Location: [facility];  Service: Ophthalmology;  Laterality: Left;   CORONARY ANGIOPLASTY     x 5   CORONARY ARTERY BYPASS GRAFT N/A 11/20/2020   Procedure: CORONARY ARTERY BYPASS GRAFTING (CABG) x  FIVE (LIMA-dLAD, SVG-PDA, SVG-D1, SeqSVG-OM1-2) ON PUMP  USING LEFT INTERNAL MAMMARY ARTERTY AND LEFT ENDOSCOPIC GREATER SAPHENOUS VEIN CONDUITS, RIGHT LEG OPENED NOT HARVESTED;  Surgeon: German Bartlett PEDLAR, MD;  Location: [facility] OR;  Service: Open Heart Surgery;  Laterality: N/A;   CORONARY PRESSURE/FFR STUDY N/A 11/15/2020   Procedure: INTRAVASCULAR PRESSURE WIRE/FFR STUDY;  Surgeon: Mady Bruckner, MD;  Location: [facility] INVASIVE CV LAB;  Service: Cardiovascular;  Laterality: N/A;   CORONARY STENT PLACEMENT     x 7   LEFT HEART CATH AND CORONARY ANGIOGRAPHY N/A 11/15/2020   Procedure: LEFT HEART CATH AND CORONARY ANGIOGRAPHY;  Surgeon: Mady Bruckner, MD;  Location: [facility] INVASIVE CV LAB;  Service: Cardiovascular;  Laterality: N/A;   LEFT HEART CATHETERIZATION WITH CORONARY ANGIOGRAM N/A 04/14/2011   Procedure: LEFT HEART CATHETERIZATION WITH CORONARY ANGIOGRAM;  Surgeon: Vinie KYM Maxcy, MD;  Location: [facility] CATH LAB;  Service: Cardiovascular;  Laterality: N/A;   LEFT HEART CATHETERIZATION WITH CORONARY ANGIOGRAM N/A 06/25/2012    Procedure: LEFT HEART CATHETERIZATION WITH CORONARY ANGIOGRAM;  Surgeon: Peter M Jordan, MD;  Location: [facility] CATH LAB;  Service: Cardiovascular;  Laterality: N/A;   TEE WITHOUT CARDIOVERSION N/A 11/20/2020   Procedure: TRANSESOPHAGEAL ECHOCARDIOGRAM (TEE);  Surgeon: German Bartlett PEDLAR, MD;  Location: [facility] OR;  Service: Open Heart Surgery;  Laterality: N/A;   TONSILLECTOMY AND ADENOIDECTOMY  1955    Social History   Socioeconomic History   Marital status: Married    Spouse name: Rojelio   Number of children: 2   Years of education: Not on file   Highest education level: Not on file  Occupational History   Occupation: [employer]-retired   Occupation: Airline Pilot  Tobacco Use   Smoking status: Former    Current packs/day: 0.00    Average packs/day: 1.5 packs/day for 40.0 years (60.0 ttl pk-yrs)    Types: Cigarettes    Start date: 09/14/1967    Quit date: 09/14/2007    Years since quitting: 16.6   Smokeless tobacco: Never   Tobacco comments:    quit 2009  Vaping Use   Vaping status: Never Used  Substance and Sexual Activity   Alcohol use: No    Alcohol/week: 0.0 standard drinks of alcohol    Comment: rarely 1 beer in last 5 years   Drug use: Not Currently    Types: Marijuana    Comment: pt stopped fall 2012   Sexual activity: Never  Other Topics Concern   Not on file  Social History Narrative   Retired twice    Married to [place], has 2 children from previous marriage   Pet: 3 dogs    Social Drivers of Health   Tobacco Use: Medium Risk (04/29/2024)   Patient History    Smoking Tobacco Use: Former    Smokeless Tobacco Use: Never    Passive Exposure: Not on Actuary Strain: Low Risk (01/18/2024)   Overall Financial Resource Strain (CARDIA)    Difficulty of Paying Living Expenses: Not hard at all  Food Insecurity: No Food Insecurity (01/18/2024)   Epic    Worried About Radiation Protection Practitioner of Food in the Last Year: Never true    Ran Out of Food in the Last Year: Never true   Transportation Needs: No Transportation Needs (01/18/2024)   Epic    Lack of Transportation (Medical): No    Lack of Transportation (Non-Medical): No  Physical Activity: Inactive (01/18/2024)   Exercise Vital Sign    Days of Exercise per Week: 0 days    Minutes of Exercise per Session: 0 min  Stress: No Stress Concern Present (01/18/2024)   [employer] of Occupational Health - Occupational Stress Questionnaire    Feeling of Stress: Not at all  Social Connections: Moderately Isolated (01/18/2024)   Social Connection and Isolation Panel    Frequency of Communication with Friends and Family: More than three times a week    Frequency of Social Gatherings with Friends and Family: More than three times a week    Attends Religious Services: Never    Database Administrator or Organizations: No    Attends Banker Meetings: Never    Marital Status: Married  Catering Manager Violence: Not At Risk (01/18/2024)   Epic    Fear of Current or Ex-Partner: No    Emotionally Abused: No    Physically Abused: No    Sexually Abused: No  Depression (PHQ2-9): Low Risk (04/29/2024)   Depression (PHQ2-9)    PHQ-2 Score: 0  Alcohol Screen: Low Risk (01/18/2024)   Alcohol Screen    Last Alcohol Screening Score (AUDIT): 0  Housing: Unknown (01/18/2024)   Epic    Unable to Pay for Housing in the Last Year: No    Number of Times Moved in the Last Year: Not on file    Homeless in the Last Year: No  Utilities: Not At Risk (01/18/2024)   Epic    Threatened with loss of utilities: No  Health Literacy: Adequate Health Literacy (01/18/2024)   B1300 Health Literacy    Frequency of need for help with medical instructions: Never    Family History  Problem Relation Age of Onset   COPD Mother    Other Father        muscle tumor   Stroke Father    Prostate cancer Father        mets   Heart disease Father    Alcoholism Father    Stroke Sister    Heart block Sister        Heart bypass   Bladder Cancer  Neg Hx    Kidney cancer Neg Hx     Current Medications[2]  Physical exam:  Vitals:   04/29/24 1304  BP: 137/76  Pulse: 91  Resp: 19  Temp: 97.9 F (36.6 C)  TempSrc: Tympanic  SpO2: (!) 19%  Weight: 188 lb 6.4 oz (85.5 kg)  Height: 5' 8 (1.727 m)   Physical Exam Cardiovascular:     Rate and Rhythm: Normal rate and regular rhythm.     Heart sounds: Normal heart sounds.  Pulmonary:     Effort: Pulmonary effort is normal.     Breath sounds: Normal breath sounds.  Skin:    General: Skin is warm and dry.  Neurological:     Mental Status: He is alert and oriented to person, place, and time.      I have personally reviewed labs listed below:    Latest Ref Rng & Units 04/29/2024   12:29 PM  CMP  Glucose 70 - 99 mg/dL 711   BUN 8 - 23 mg/dL 14   Creatinine 9.38 - 1.24 mg/dL 8.61   Sodium 864 - 854 mmol/L 137   Potassium 3.5 - 5.1 mmol/L 4.3   Chloride 98 - 111 mmol/L 100   CO2 22 - 32 mmol/L 26   Calcium  8.9 - 10.3 mg/dL 9.8   Total Protein 6.5 - 8.1 g/dL 6.8   Total Bilirubin 0.0 - 1.2 mg/dL 1.3   Alkaline Phos 38 - 126 U/L 96   AST 15 - 41 U/L 38   ALT 0 - 44 U/L 91       Latest Ref Rng & Units 04/29/2024   12:29  PM  CBC  WBC 4.0 - 10.5 K/uL 4.2   Hemoglobin 13.0 - 17.0 g/dL 86.6   Hematocrit 60.9 - 52.0 % 39.7   Platelets 150 - 400 K/uL 151    I have personally reviewed Radiology images listed below: No images are attached to the encounter.  NM PET CT CARDIAC PERFUSION MULTI W/ABSOLUTE BLOODFLOW Result Date: 04/14/2024   LV perfusion is abnormal. Defect 1: There is a large defect with severe reduction in uptake present in the apical to basal anterior and anteroseptal location(s) that is minimally reversible. There is abnormal wall motion in the defect area. Consistent with infarction but cannot rule out subtle peri-infarct ischemia.   Rest left ventricular function is abnormal. Rest global function is moderately reduced. Rest EF: 35%. Stress left  ventricular function is abnormal. Stress global function is moderately reduced. Stress EF: 36%. End diastolic cavity size is moderately enlarged.   Myocardial blood flow reserve is not reported in this patient due to technical or patient-specific concerns that affect accuracy.   Coronary calcium  assessment not performed due to prior revascularization.   Findings are consistent with large area of infarction. The study is intermediate risk.   Electronically signed by: Lonni Hanson, MD. CLINICAL DATA:  This over-read does not include interpretation of cardiac or coronary anatomy or pathology. The Cardiac PET CT interpretation by the cardiologist is attached. COMPARISON:  Nuclear medicine PET study dated 02/23/2024 FINDINGS: Cardiovascular: Normal appearance of extracardiac vascular structures. Mediastinum/Nodes: Normal esophagus. No pathologically enlarged mediastinal or hilar lymph nodes. Lungs/Pleura: The imaged central airways are patent. Unchanged non hypermetabolic pulmonary nodules measuring up to 5 mm, for example right middle lobe (4:36) and right lower lobe (4:10, 31, 41, 47, 67). Peripheral left lower lobe calcified granuloma (4:53). No pneumothorax. No pleural effusion. Upper abdomen: Unchanged 2.6 x 2.3 cm left adrenal nodule measures 10 HU, in keeping with adenoma. No specific follow-up imaging recommended. Musculoskeletal: No acute or abnormal lytic or blastic osseous lesions. Median sternotomy wires are nondisplaced. IMPRESSION: 1. Unchanged non hypermetabolic pulmonary nodules measuring up to 5 mm. 2. Unchanged 2.6 cm left adrenal adenoma. No specific follow-up imaging recommended. Electronically Signed   By: Limin  Xu M.D.   On: 04/14/2024 13:45    Assessment and plan- Patient is a 73 y.o. male here for routine f/u of non metastatic castrate sensitive prostate cancer  Assessment and Plan    Biochemical recurrence of prostate cancer post-radiation Rapid biochemical recurrence with PSA  doubling time <6 months post-radiation. PSMA PET showed prostate uptake, no metastasis. Rising PSA indicates active disease. ADT indicated to suppress testosterone, reduce PSA, delay metastasis. Discussed ADT risks: fatigue, hot flashes, mood changes, decreased bone density, increased cardiovascular risk. - Initiated ADT injection today eligard  22.5 mg - Scheduled next ADT injection in three months. - Planned follow-up in three months with repeat PSA.  Risk of osteoporosis due to androgen deprivation therapy ADT increases risk of osteopenia and osteoporosis by reducing testosterone, impacting bone density. - Ordered bone density scan in the next couple of months. - Recommended daily 1200 mg calcium  and 800 IU vitamin D. - Provided written instructions for calcium  and vitamin D supplementation.         Visit Diagnosis 1. Prostate cancer (HCC)   2. Primary hypertension   3. Testicular hypofunction   4. 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 Annah Skene, MD, MPH [facility] [phone] 04/29/2024 3:23 PM                   [  1]  Allergies Allergen Reactions   Hydroxyzine Hcl Other (See Comments)    Weakness, out of this world feeling. sluggishness   Latex Rash and Other (See Comments)    I.e. Elastic= rash to blisters if worn for an extended time  Patient show effects after long exposure.  [2]  Current Outpatient Medications:    acetaminophen  (TYLENOL ) 500 MG tablet, Take 2 tablets (1,000 mg total) by mouth every 6 (six) hours as needed., Disp: 100 tablet, Rfl: 2   aspirin  EC 81 MG tablet, Take 1 tablet (81 mg total) by mouth daily., Disp: , Rfl:    blood glucose meter kit and supplies KIT, Dispense based on patient and insurance preference. Use up to four times daily as directed., Disp: 1 each, Rfl: 0   diphenhydramine -acetaminophen  (TYLENOL  PM) 25-500 MG TABS tablet, Take 1 tablet by mouth at bedtime as needed., Disp: , Rfl:     insulin  glargine (LANTUS ) 100 UNIT/ML Solostar Pen, Inject 25 Units into the skin daily., Disp: 30 mL, Rfl: 2   Insulin  Pen Needle 31G X 5 MM MISC, Inject insulin  via insulin  pen daily, Disp: 50 each, Rfl: 0   ketoconazole  (NIZORAL ) 2 % cream, Apply twice daily to pink, scaly areas on face, chest, arms until improved. (Patient taking differently: Apply 1 Application topically 2 (two) times daily as needed for irritation. Apply twice daily to pink, scaly areas on face, chest, arms until improved.), Disp: 60 g, Rfl: 3   Magnesium  250 MG TABS, Take 1 tablet by mouth in the morning and at bedtime., Disp: , Rfl:    metFORMIN  (GLUCOPHAGE ) 1000 MG tablet, Take 1 tablet (1,000 mg total) by mouth 2 (two) times daily with a meal., Disp: 180 tablet, Rfl: 3   Multiple Vitamins-Minerals (MULTIVITAMINS THER. W/MINERALS) TABS, Take 1 tablet by mouth every morning., Disp: , Rfl:    niacin  (NIASPAN ) 1000 MG CR tablet, Take 2 tablets (2000 mg) by mouth once daily at bedtime, Disp: 180 tablet, Rfl: 3   nitroGLYCERIN  (NITROLINGUAL ) 0.4 MG/SPRAY spray, Place 1 spray under the tongue every 5 (five) minutes x 3 doses as needed for chest pain., Disp: 12 g, Rfl: 6   Semaglutide , 2 MG/DOSE, 8 MG/3ML SOPN, Inject 2 mg as directed once a week., Disp: 9 mL, Rfl: 0   atorvastatin  (LIPITOR ) 80 MG tablet, Take 1 tablet (80 mg total) by mouth daily., Disp: 90 tablet, Rfl: 3   carvedilol  (COREG ) 6.25 MG tablet, Take 1 tablet (6.25 mg total) by mouth 2 (two) times daily with a meal., Disp: 180 tablet, Rfl: 3   ezetimibe  (ZETIA ) 10 MG tablet, Take 1 tablet (10 mg total) by mouth daily., Disp: 90 tablet, Rfl: 3   furosemide  (LASIX ) 40 MG tablet, Take 1 tablet (40 mg total) by mouth daily., Disp: 90 tablet, Rfl: 3   isosorbide  mononitrate (IMDUR ) 30 MG 24 hr tablet, Take 1 tablet (30 mg total) by mouth daily., Disp: 90 tablet, Rfl: 3   losartan  (COZAAR ) 25 MG tablet, Take 1 tablet (25 mg total) by mouth daily., Disp: 90 tablet, Rfl: 3    potassium chloride  SA (KLOR-CON  M) 20 MEQ tablet, Take 1 tablet (20 mEq total) by mouth daily., Disp: 90 tablet, Rfl: 3   predniSONE  (STERAPRED UNI-PAK 21 TAB) 10 MG (21) TBPK tablet, Take by mouth daily. Take 6 tabs by mouth daily  for 1 days, then 5 tabs for 1 days, then 4 tabs for 1 days, then 3 tabs for 1 days, 2 tabs for  1 days, then 1 tab by mouth daily for 1 days (Patient not taking: Reported on 04/28/2024), Disp: 21 tablet, Rfl: 0  "

## 2024-05-17 ENCOUNTER — Inpatient Hospital Stay: Admission: RE | Admit: 2024-05-17 | Source: Ambulatory Visit

## 2024-05-23 ENCOUNTER — Telehealth: Payer: Self-pay

## 2024-05-23 NOTE — Telephone Encounter (Signed)
 Lm and sent MyChart message;  Your appontment on 05/24/2024 has to be rescheduled, do to provider being sick. Please call the office to reschedule your appointment.  Thank you.

## 2024-05-24 ENCOUNTER — Encounter: Admitting: Nurse Practitioner

## 2024-05-25 ENCOUNTER — Telehealth: Payer: Self-pay | Admitting: *Deleted

## 2024-05-25 NOTE — Telephone Encounter (Signed)
 LEA  scheduled at [facility] for: Friday May 27, 2024 7:30 AM Arrival time [facility] Main Entrance A at: 5:30 AM  Diet: -Nothing to eat after midnight.  Hydration: -May drink clear liquids until 2 hours before the procedure.  Approved liquids: Water , clear tea, black coffee, fruit juices-non-citric and without pulp,Gatorade, plain Jello/popsicles.   -Please drink 16 oz of water  2 hours before procedure.  Medication instructions: -Hold:  Metformin -day of procedure and 48 hours post procedure  Lasix /KCl-AM of procedure  Insulin -AM of procedure  Pt tells me he does not take Insulin  HS -Other usual morning medications can be taken including aspirin  81 mg.  Pt tells me Ozempic  is weekly on Saturdays.  Plan to go home the same day, you will only stay overnight if medically necessary.  You must have responsible adult to drive you home.  Someone must be with you the first 24 hours after you arrive home.

## 2024-05-27 ENCOUNTER — Other Ambulatory Visit: Payer: Self-pay | Admitting: *Deleted

## 2024-05-27 ENCOUNTER — Ambulatory Visit (HOSPITAL_COMMUNITY)
Admission: RE | Admit: 2024-05-27 | Discharge: 2024-05-27 | Disposition: A | Discharge status: Home / Self Care | Attending: Cardiovascular Disease | Admitting: Cardiovascular Disease

## 2024-05-27 ENCOUNTER — Encounter (HOSPITAL_COMMUNITY): Admission: RE | Disposition: A | Payer: Self-pay | Source: Home / Self Care | Attending: Cardiovascular Disease

## 2024-05-27 ENCOUNTER — Other Ambulatory Visit: Payer: Self-pay

## 2024-05-27 DIAGNOSIS — Z79899 Other long term (current) drug therapy: Secondary | ICD-10-CM | POA: Insufficient documentation

## 2024-05-27 DIAGNOSIS — E1151 Type 2 diabetes mellitus with diabetic peripheral angiopathy without gangrene: Secondary | ICD-10-CM | POA: Insufficient documentation

## 2024-05-27 DIAGNOSIS — Z7984 Long term (current) use of oral hypoglycemic drugs: Secondary | ICD-10-CM | POA: Diagnosis not present

## 2024-05-27 DIAGNOSIS — Z794 Long term (current) use of insulin: Secondary | ICD-10-CM | POA: Insufficient documentation

## 2024-05-27 DIAGNOSIS — I110 Hypertensive heart disease with heart failure: Secondary | ICD-10-CM | POA: Diagnosis not present

## 2024-05-27 DIAGNOSIS — Z7985 Long-term (current) use of injectable non-insulin antidiabetic drugs: Secondary | ICD-10-CM | POA: Insufficient documentation

## 2024-05-27 DIAGNOSIS — E785 Hyperlipidemia, unspecified: Secondary | ICD-10-CM | POA: Diagnosis not present

## 2024-05-27 DIAGNOSIS — I5022 Chronic systolic (congestive) heart failure: Secondary | ICD-10-CM | POA: Insufficient documentation

## 2024-05-27 DIAGNOSIS — I25119 Atherosclerotic heart disease of native coronary artery with unspecified angina pectoris: Secondary | ICD-10-CM | POA: Diagnosis not present

## 2024-05-27 DIAGNOSIS — Z87891 Personal history of nicotine dependence: Secondary | ICD-10-CM | POA: Insufficient documentation

## 2024-05-27 DIAGNOSIS — I739 Peripheral vascular disease, unspecified: Secondary | ICD-10-CM

## 2024-05-27 DIAGNOSIS — I70211 Atherosclerosis of native arteries of extremities with intermittent claudication, right leg: Secondary | ICD-10-CM | POA: Insufficient documentation

## 2024-05-27 HISTORY — PX: LOWER EXTREMITY INTERVENTION: CATH118252

## 2024-05-27 HISTORY — PX: ABDOMINAL AORTOGRAM: CATH118222

## 2024-05-27 HISTORY — PX: ABDOMINAL AORTOGRAM W/LOWER EXTREMITY: CATH118223

## 2024-05-27 LAB — GLUCOSE, CAPILLARY
Glucose-Capillary: 184 mg/dL — ABNORMAL HIGH (ref 70–99)
Glucose-Capillary: 164 mg/dL — ABNORMAL HIGH (ref 70–99)

## 2024-05-27 MED ORDER — SODIUM CHLORIDE 0.9 % IV SOLN
INTRAVENOUS | Status: DC | PRN
  Administered 2024-05-27: 10 mL/h via INTRAVENOUS

## 2024-05-27 MED ORDER — CLOPIDOGREL BISULFATE 300 MG PO TABS
ORAL_TABLET | ORAL | Status: DC | PRN
  Administered 2024-05-27: 300 mg via ORAL

## 2024-05-27 MED ORDER — HEPARIN (PORCINE) IN NACL 1000-0.9 UT/500ML-% IV SOLN
INTRAVENOUS | Status: DC | PRN
  Administered 2024-05-27: 1000 mL

## 2024-05-27 MED ORDER — MIDAZOLAM HCL (PF) 2 MG/2ML IJ SOLN
INTRAMUSCULAR | Status: DC | PRN
  Administered 2024-05-27: 1 mg via INTRAVENOUS

## 2024-05-27 MED ORDER — SODIUM CHLORIDE 0.9% FLUSH: 3.0000 mL | Freq: Two times a day (BID) | INTRAVENOUS | Status: DC

## 2024-05-27 MED ORDER — FENTANYL CITRATE (PF) 100 MCG/2ML IJ SOLN
INTRAMUSCULAR | Status: AC
  Filled 2024-05-27: qty 2

## 2024-05-27 MED ORDER — LIDOCAINE HCL (PF) 1 % IJ SOLN
INTRAMUSCULAR | Status: AC
  Filled 2024-05-27: qty 30

## 2024-05-27 MED ORDER — ASPIRIN 81 MG PO CHEW: 81.0000 mg | CHEWABLE_TABLET | ORAL | Status: DC

## 2024-05-27 MED ORDER — SODIUM CHLORIDE 0.9 % IV SOLN: INTRAVENOUS | Status: AC

## 2024-05-27 MED ORDER — ASPIRIN 81 MG PO CHEW
81.0000 mg | CHEWABLE_TABLET | ORAL | Status: AC
  Administered 2024-05-27: 81 mg via ORAL
  Filled 2024-05-27: qty 1

## 2024-05-27 MED ORDER — SODIUM CHLORIDE 0.9 % IV SOLN: INTRAVENOUS | Status: DC

## 2024-05-27 MED ORDER — ACETAMINOPHEN 325 MG PO TABS: 650.0000 mg | ORAL_TABLET | ORAL | Status: DC | PRN

## 2024-05-27 MED ORDER — SODIUM CHLORIDE 0.9% FLUSH: 3.0000 mL | INTRAVENOUS | Status: DC | PRN

## 2024-05-27 MED ORDER — ONDANSETRON HCL 4 MG/2ML IJ SOLN: 4.0000 mg | Freq: Four times a day (QID) | INTRAMUSCULAR | Status: DC | PRN

## 2024-05-27 MED ORDER — HEPARIN SODIUM (PORCINE) 1000 UNIT/ML IJ SOLN
INTRAMUSCULAR | Status: DC | PRN
  Administered 2024-05-27: 6000 [IU] via INTRAVENOUS
  Administered 2024-05-27: 3000 [IU] via INTRAVENOUS

## 2024-05-27 MED ORDER — SODIUM CHLORIDE 0.9 % IV SOLN: 250.0000 mL | INTRAVENOUS | Status: DC | PRN

## 2024-05-27 MED ORDER — IODIXANOL 320 MG/ML IV SOLN
INTRAVENOUS | Status: DC | PRN
  Administered 2024-05-27: 85 mL

## 2024-05-27 MED ORDER — MIDAZOLAM HCL 2 MG/2ML IJ SOLN
INTRAMUSCULAR | Status: AC
  Filled 2024-05-27: qty 2

## 2024-05-27 MED ORDER — FENTANYL CITRATE (PF) 100 MCG/2ML IJ SOLN
INTRAMUSCULAR | Status: DC | PRN
  Administered 2024-05-27: 25 ug via INTRAVENOUS

## 2024-05-27 MED ORDER — CLOPIDOGREL BISULFATE 75 MG PO TABS: 75.0000 mg | ORAL_TABLET | Freq: Every day | ORAL | 2 refills | Status: AC

## 2024-05-27 NOTE — Interval H&P Note (Signed)
 History and Physical Interval Note:  05/27/2024 7:39 AM  [name]  has presented today for surgery, with the diagnosis of pad.  The various methods of treatment have been discussed with the patient and family. After consideration of risks, benefits and other options for treatment, the patient has consented to  Procedures: Lower Extremity Angiography (N/A) as a surgical intervention.  The patient's history has been reviewed, patient examined, no change in status, stable for surgery.  I have reviewed the patient's chart and labs.  Questions were answered to the patient's satisfaction.     Joanna Hall

## 2024-05-27 NOTE — Progress Notes (Signed)
 Patient voided and ambulated with RN site is soft, clean, dry and intact. Discharge education given via RN Mel.

## 2024-05-27 NOTE — Discharge Instructions (Signed)
 HOLD METFORMIN  X 2 DAYS

## 2024-05-27 NOTE — Progress Notes (Signed)
Up and walked and tolerated well; left groin stable, no bleeding or hematoma 

## 2024-05-30 ENCOUNTER — Encounter (HOSPITAL_COMMUNITY): Payer: Self-pay | Admitting: Cardiovascular Disease

## 2024-05-31 LAB — POCT ACTIVATED CLOTTING TIME: Activated Clotting Time: 209 s

## 2024-05-31 MED FILL — Lidocaine HCl Local Preservative Free (PF) Inj 1%: INTRAMUSCULAR | Qty: 30 | Status: AC

## 2024-06-29 ENCOUNTER — Ambulatory Visit: Admitting: Cardiovascular Disease

## 2024-07-29 ENCOUNTER — Inpatient Hospital Stay: Admitting: Oncology

## 2024-07-29 ENCOUNTER — Inpatient Hospital Stay

## 2024-08-10 ENCOUNTER — Encounter: Admitting: Nurse Practitioner

## 2024-10-13 ENCOUNTER — Inpatient Hospital Stay

## 2024-10-20 ENCOUNTER — Ambulatory Visit: Admitting: Radiation Oncology

## 2025-01-23 ENCOUNTER — Ambulatory Visit
